# Patient Record
Sex: Male | Born: 1945 | Race: Black or African American | Hispanic: No | Marital: Married | Smoking: Former smoker
Health system: Southern US, Community
[De-identification: ages and names within clinical notes are randomized; demographics above are authoritative.]

## PROBLEM LIST (undated history)

## (undated) ENCOUNTER — Emergency Department (HOSPITAL_COMMUNITY): Admission: EM | Payer: Medicare PPO | Source: Home / Self Care

## (undated) DIAGNOSIS — J449 Chronic obstructive pulmonary disease, unspecified: Secondary | ICD-10-CM

## (undated) DIAGNOSIS — N183 Chronic kidney disease, stage 3 (moderate): Secondary | ICD-10-CM

## (undated) DIAGNOSIS — I2510 Atherosclerotic heart disease of native coronary artery without angina pectoris: Secondary | ICD-10-CM

## (undated) DIAGNOSIS — I5042 Chronic combined systolic (congestive) and diastolic (congestive) heart failure: Secondary | ICD-10-CM

## (undated) DIAGNOSIS — I712 Thoracic aortic aneurysm, without rupture: Secondary | ICD-10-CM

## (undated) DIAGNOSIS — I429 Cardiomyopathy, unspecified: Secondary | ICD-10-CM

## (undated) DIAGNOSIS — I4819 Other persistent atrial fibrillation: Secondary | ICD-10-CM

## (undated) DIAGNOSIS — I10 Essential (primary) hypertension: Secondary | ICD-10-CM

## (undated) DIAGNOSIS — Z7901 Long term (current) use of anticoagulants: Secondary | ICD-10-CM

## (undated) DIAGNOSIS — I509 Heart failure, unspecified: Secondary | ICD-10-CM

## (undated) DIAGNOSIS — E785 Hyperlipidemia, unspecified: Secondary | ICD-10-CM

## (undated) DIAGNOSIS — J189 Pneumonia, unspecified organism: Secondary | ICD-10-CM

## (undated) DIAGNOSIS — C9590 Leukemia, unspecified not having achieved remission: Secondary | ICD-10-CM

## (undated) DIAGNOSIS — K759 Inflammatory liver disease, unspecified: Secondary | ICD-10-CM

## (undated) DIAGNOSIS — D649 Anemia, unspecified: Secondary | ICD-10-CM

## (undated) DIAGNOSIS — K219 Gastro-esophageal reflux disease without esophagitis: Secondary | ICD-10-CM

## (undated) DIAGNOSIS — R51 Headache: Secondary | ICD-10-CM

## (undated) DIAGNOSIS — G8929 Other chronic pain: Secondary | ICD-10-CM

## (undated) DIAGNOSIS — IMO0002 Reserved for concepts with insufficient information to code with codable children: Secondary | ICD-10-CM

## (undated) DIAGNOSIS — N400 Enlarged prostate without lower urinary tract symptoms: Secondary | ICD-10-CM

## (undated) DIAGNOSIS — M1990 Unspecified osteoarthritis, unspecified site: Secondary | ICD-10-CM

## (undated) DIAGNOSIS — M329 Systemic lupus erythematosus, unspecified: Secondary | ICD-10-CM

## (undated) DIAGNOSIS — R350 Frequency of micturition: Secondary | ICD-10-CM

## (undated) DIAGNOSIS — C9000 Multiple myeloma not having achieved remission: Secondary | ICD-10-CM

## (undated) DIAGNOSIS — E079 Disorder of thyroid, unspecified: Secondary | ICD-10-CM

## (undated) DIAGNOSIS — G4730 Sleep apnea, unspecified: Secondary | ICD-10-CM

## (undated) DIAGNOSIS — R0600 Dyspnea, unspecified: Secondary | ICD-10-CM

## (undated) HISTORY — PX: EYE SURGERY: SHX253

## (undated) HISTORY — PX: COLONOSCOPY: SHX174

## (undated) HISTORY — PX: CHOLECYSTECTOMY: SHX55

## (undated) HISTORY — PX: BACK SURGERY: SHX140

## (undated) HISTORY — PX: TONSILLECTOMY: SUR1361

## (undated) HISTORY — PX: ABDOMINAL SURGERY: SHX537

## (undated) HISTORY — PX: HERNIA REPAIR: SHX51

## (undated) HISTORY — PX: OTHER SURGICAL HISTORY: SHX169

## (undated) HISTORY — PX: ROTATOR CUFF REPAIR: SHX139

## (undated) HISTORY — PX: HAND SURGERY: SHX662

## (undated) HISTORY — PX: CORONARY ANGIOPLASTY: SHX604

## (undated) HISTORY — DX: Atherosclerotic heart disease of native coronary artery without angina pectoris: I25.10

## (undated) HISTORY — DX: Long term (current) use of anticoagulants: Z79.01

## (undated) HISTORY — DX: Cardiomyopathy, unspecified: I42.9

## (undated) HISTORY — DX: Essential (primary) hypertension: I10

## (undated) HISTORY — DX: Thoracic aortic aneurysm, without rupture: I71.2

## (undated) HISTORY — DX: Chronic obstructive pulmonary disease, unspecified: J44.9

## (undated) HISTORY — DX: Chronic kidney disease, stage 3 (moderate): N18.3

## (undated) HISTORY — DX: Chronic combined systolic (congestive) and diastolic (congestive) heart failure: I50.42

## (undated) HISTORY — DX: Other persistent atrial fibrillation: I48.19

---

## 1999-06-23 ENCOUNTER — Emergency Department (HOSPITAL_COMMUNITY): Admission: EM | Admit: 1999-06-23 | Discharge: 1999-06-23 | Payer: Self-pay | Admitting: Emergency Medicine

## 1999-06-23 ENCOUNTER — Encounter: Payer: Self-pay | Admitting: Emergency Medicine

## 2000-05-23 ENCOUNTER — Ambulatory Visit (HOSPITAL_COMMUNITY): Admission: RE | Admit: 2000-05-23 | Discharge: 2000-05-23 | Payer: Self-pay | Admitting: Internal Medicine

## 2000-05-23 ENCOUNTER — Encounter: Payer: Self-pay | Admitting: Internal Medicine

## 2005-09-25 ENCOUNTER — Emergency Department (HOSPITAL_COMMUNITY): Admission: EM | Admit: 2005-09-25 | Discharge: 2005-09-25 | Payer: Self-pay | Admitting: Emergency Medicine

## 2006-12-16 ENCOUNTER — Emergency Department (HOSPITAL_COMMUNITY): Admission: EM | Admit: 2006-12-16 | Discharge: 2006-12-17 | Payer: Self-pay | Admitting: Emergency Medicine

## 2007-01-08 ENCOUNTER — Emergency Department (HOSPITAL_COMMUNITY): Admission: EM | Admit: 2007-01-08 | Discharge: 2007-01-08 | Payer: Self-pay | Admitting: Emergency Medicine

## 2009-02-05 ENCOUNTER — Encounter: Admission: RE | Admit: 2009-02-05 | Discharge: 2009-02-05 | Payer: Self-pay | Admitting: Gastroenterology

## 2009-08-03 ENCOUNTER — Emergency Department (HOSPITAL_COMMUNITY): Admission: EM | Admit: 2009-08-03 | Discharge: 2009-08-03 | Payer: Self-pay | Admitting: Emergency Medicine

## 2009-12-15 ENCOUNTER — Ambulatory Visit (HOSPITAL_COMMUNITY): Admission: RE | Admit: 2009-12-15 | Discharge: 2009-12-16 | Payer: Self-pay | Admitting: Specialist

## 2010-08-22 ENCOUNTER — Encounter: Payer: Self-pay | Admitting: Gastroenterology

## 2010-08-26 ENCOUNTER — Encounter
Admission: RE | Admit: 2010-08-26 | Discharge: 2010-08-26 | Payer: Self-pay | Source: Home / Self Care | Attending: Specialist | Admitting: Specialist

## 2010-09-23 ENCOUNTER — Other Ambulatory Visit: Payer: Self-pay | Admitting: Specialist

## 2010-09-23 ENCOUNTER — Encounter (HOSPITAL_COMMUNITY)
Admission: RE | Admit: 2010-09-23 | Discharge: 2010-09-23 | Disposition: A | Discharge status: Home / Self Care | Payer: Medicare Other | Source: Ambulatory Visit | Attending: Specialist | Admitting: Specialist

## 2010-09-23 ENCOUNTER — Encounter (HOSPITAL_COMMUNITY): Payer: Medicare Other

## 2010-09-23 DIAGNOSIS — Z01818 Encounter for other preprocedural examination: Secondary | ICD-10-CM | POA: Insufficient documentation

## 2010-09-23 DIAGNOSIS — Z01812 Encounter for preprocedural laboratory examination: Secondary | ICD-10-CM | POA: Insufficient documentation

## 2010-09-23 DIAGNOSIS — M48061 Spinal stenosis, lumbar region without neurogenic claudication: Secondary | ICD-10-CM | POA: Insufficient documentation

## 2010-09-23 DIAGNOSIS — Z01810 Encounter for preprocedural cardiovascular examination: Secondary | ICD-10-CM | POA: Insufficient documentation

## 2010-09-23 LAB — URINALYSIS, ROUTINE W REFLEX MICROSCOPIC
Bilirubin Urine: NEGATIVE
Hgb urine dipstick: NEGATIVE
Leukocytes, UA: NEGATIVE
Nitrite: NEGATIVE
Protein, ur: NEGATIVE mg/dL
Specific Gravity, Urine: 1.03 (ref 1.005–1.030)
Urine Glucose, Fasting: >1000 mg/dL — AB
Urobilinogen, UA: 0.2 mg/dL (ref 0.0–1.0)
pH: 5 (ref 5.0–8.0)

## 2010-09-23 LAB — SURGICAL PCR SCREEN
MRSA, PCR: NEGATIVE
Staphylococcus aureus: NEGATIVE

## 2010-09-23 LAB — COMPREHENSIVE METABOLIC PANEL
ALT: 127 U/L — ABNORMAL HIGH (ref 0–53)
AST: 57 U/L — ABNORMAL HIGH (ref 0–37)
Albumin: 4 g/dL (ref 3.5–5.2)
Alkaline Phosphatase: 68 U/L (ref 39–117)
BUN: 8 mg/dL (ref 6–23)
CO2: 27 mEq/L (ref 19–32)
Calcium: 10.1 mg/dL (ref 8.4–10.5)
Chloride: 104 mEq/L (ref 96–112)
Creatinine, Ser: 0.98 mg/dL (ref 0.4–1.5)
GFR calc Af Amer: >60 mL/min (ref >60)
GFR calc non Af Amer: >60 mL/min (ref >60)
Glucose, Bld: 203 mg/dL — ABNORMAL HIGH (ref 70–99)
Potassium: 4.5 mEq/L (ref 3.5–5.1)
Sodium: 137 mEq/L (ref 135–145)
Total Bilirubin: 0.6 mg/dL (ref 0.3–1.2)
Total Protein: 8.8 g/dL — ABNORMAL HIGH (ref 6.0–8.3)

## 2010-09-23 LAB — CBC
HCT: 38.3 % — ABNORMAL LOW (ref 39.0–52.0)
Hemoglobin: 13 g/dL (ref 13.0–17.0)
MCH: 30.4 pg (ref 26.0–34.0)
MCHC: 33.9 g/dL (ref 30.0–36.0)
MCV: 89.7 fL (ref 78.0–100.0)
Platelets: 191 10*3/uL (ref 150–400)
RBC: 4.27 MIL/uL (ref 4.22–5.81)
RDW: 12.9 % (ref 11.5–15.5)
WBC: 5.7 10*3/uL (ref 4.0–10.5)

## 2010-09-23 LAB — APTT: aPTT: 28 seconds (ref 24–37)

## 2010-09-23 LAB — URINE MICROSCOPIC-ADD ON

## 2010-09-23 LAB — PROTIME-INR
INR: 1.04 (ref 0.00–1.49)
Prothrombin Time: 13.8 seconds (ref 11.6–15.2)

## 2010-09-28 ENCOUNTER — Ambulatory Visit (HOSPITAL_COMMUNITY): Payer: Medicare Other

## 2010-09-28 ENCOUNTER — Observation Stay (HOSPITAL_COMMUNITY)
Admission: RE | Admit: 2010-09-28 | Discharge: 2010-09-29 | Disposition: A | Discharge status: Home / Self Care | Payer: Medicare Other | Source: Ambulatory Visit | Attending: Specialist | Admitting: Specialist

## 2010-09-28 DIAGNOSIS — I10 Essential (primary) hypertension: Secondary | ICD-10-CM | POA: Insufficient documentation

## 2010-09-28 DIAGNOSIS — E119 Type 2 diabetes mellitus without complications: Secondary | ICD-10-CM | POA: Insufficient documentation

## 2010-09-28 DIAGNOSIS — M5126 Other intervertebral disc displacement, lumbar region: Principal | ICD-10-CM | POA: Insufficient documentation

## 2010-09-28 DIAGNOSIS — Z79899 Other long term (current) drug therapy: Secondary | ICD-10-CM | POA: Insufficient documentation

## 2010-09-28 LAB — GLUCOSE, CAPILLARY
Glucose-Capillary: 153 mg/dL — ABNORMAL HIGH (ref 70–99)
Glucose-Capillary: 129 mg/dL — ABNORMAL HIGH (ref 70–99)
Glucose-Capillary: 141 mg/dL — ABNORMAL HIGH (ref 70–99)
Glucose-Capillary: 153 mg/dL — ABNORMAL HIGH (ref 70–99)
Glucose-Capillary: 161 mg/dL — ABNORMAL HIGH (ref 70–99)

## 2010-09-29 LAB — GLUCOSE, CAPILLARY
Glucose-Capillary: 168 mg/dL — ABNORMAL HIGH (ref 70–99)
Glucose-Capillary: 189 mg/dL — ABNORMAL HIGH (ref 70–99)
Glucose-Capillary: 178 mg/dL — ABNORMAL HIGH (ref 70–99)
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
## 2010-10-17 LAB — GLUCOSE, CAPILLARY
Glucose-Capillary: 177 mg/dL — ABNORMAL HIGH (ref 70–99)
Glucose-Capillary: 200 mg/dL — ABNORMAL HIGH (ref 70–99)
Glucose-Capillary: 229 mg/dL — ABNORMAL HIGH (ref 70–99)
Glucose-Capillary: 238 mg/dL — ABNORMAL HIGH (ref 70–99)
Glucose-Capillary: 403 mg/dL — ABNORMAL HIGH (ref 70–99)

## 2010-10-18 LAB — URINALYSIS, ROUTINE W REFLEX MICROSCOPIC
Bilirubin Urine: NEGATIVE
Glucose, UA: 250 mg/dL — AB
Hgb urine dipstick: NEGATIVE
Ketones, ur: NEGATIVE mg/dL
Nitrite: NEGATIVE
Protein, ur: NEGATIVE mg/dL
Specific Gravity, Urine: 1.024 (ref 1.005–1.030)
Urobilinogen, UA: 0.2 mg/dL (ref 0.0–1.0)
pH: 5.5 (ref 5.0–8.0)

## 2010-10-18 LAB — CBC
HCT: 40.1 % (ref 39.0–52.0)
Hemoglobin: 12.9 g/dL — ABNORMAL LOW (ref 13.0–17.0)
MCHC: 32.3 g/dL (ref 30.0–36.0)
MCV: 82.2 fL (ref 78.0–100.0)
Platelets: 175 10*3/uL (ref 150–400)
RBC: 4.88 MIL/uL (ref 4.22–5.81)
RDW: 22.9 % — ABNORMAL HIGH (ref 11.5–15.5)
WBC: 5 10*3/uL (ref 4.0–10.5)

## 2010-10-18 LAB — COMPREHENSIVE METABOLIC PANEL
ALT: 55 U/L — ABNORMAL HIGH (ref 0–53)
AST: 40 U/L — ABNORMAL HIGH (ref 0–37)
Albumin: 4.2 g/dL (ref 3.5–5.2)
Alkaline Phosphatase: 73 U/L (ref 39–117)
BUN: 8 mg/dL (ref 6–23)
CO2: 23 mEq/L (ref 19–32)
Calcium: 9.8 mg/dL (ref 8.4–10.5)
Chloride: 109 mEq/L (ref 96–112)
Creatinine, Ser: 0.91 mg/dL (ref 0.4–1.5)
GFR calc Af Amer: >60 mL/min (ref >60)
GFR calc non Af Amer: >60 mL/min (ref >60)
Glucose, Bld: 182 mg/dL — ABNORMAL HIGH (ref 70–99)
Potassium: 4.1 mEq/L (ref 3.5–5.1)
Sodium: 138 mEq/L (ref 135–145)
Total Bilirubin: 0.4 mg/dL (ref 0.3–1.2)
Total Protein: 8.8 g/dL — ABNORMAL HIGH (ref 6.0–8.3)

## 2010-10-18 LAB — APTT: aPTT: 30 seconds (ref 24–37)

## 2010-10-18 LAB — PROTIME-INR
INR: 1.12 (ref 0.00–1.49)
Prothrombin Time: 14.3 seconds (ref 11.6–15.2)

## 2011-02-24 ENCOUNTER — Emergency Department (HOSPITAL_COMMUNITY)
Admission: EM | Admit: 2011-02-24 | Discharge: 2011-02-24 | Disposition: A | Discharge status: Home / Self Care | Payer: Medicare Other | Attending: Emergency Medicine | Admitting: Emergency Medicine

## 2011-02-24 ENCOUNTER — Emergency Department (HOSPITAL_COMMUNITY): Payer: Medicare Other

## 2011-02-24 DIAGNOSIS — R0602 Shortness of breath: Secondary | ICD-10-CM | POA: Insufficient documentation

## 2011-02-24 DIAGNOSIS — R197 Diarrhea, unspecified: Secondary | ICD-10-CM | POA: Insufficient documentation

## 2011-02-24 DIAGNOSIS — R109 Unspecified abdominal pain: Secondary | ICD-10-CM | POA: Insufficient documentation

## 2011-02-24 DIAGNOSIS — K7689 Other specified diseases of liver: Secondary | ICD-10-CM | POA: Insufficient documentation

## 2011-02-24 DIAGNOSIS — R05 Cough: Secondary | ICD-10-CM | POA: Insufficient documentation

## 2011-02-24 DIAGNOSIS — R059 Cough, unspecified: Secondary | ICD-10-CM | POA: Insufficient documentation

## 2011-02-24 DIAGNOSIS — R110 Nausea: Secondary | ICD-10-CM | POA: Insufficient documentation

## 2011-02-24 LAB — DIFFERENTIAL
Basophils Absolute: 0 10*3/uL (ref 0.0–0.1)
Basophils Relative: 0 % (ref 0–1)
Eosinophils Absolute: 0.1 10*3/uL (ref 0.0–0.7)
Eosinophils Relative: 3 % (ref 0–5)
Lymphocytes Relative: 39 % (ref 12–46)
Lymphs Abs: 2 10*3/uL (ref 0.7–4.0)
Monocytes Absolute: 0.4 10*3/uL (ref 0.1–1.0)
Monocytes Relative: 8 % (ref 3–12)
Neutro Abs: 2.6 10*3/uL (ref 1.7–7.7)
Neutrophils Relative %: 50 % (ref 43–77)

## 2011-02-24 LAB — COMPREHENSIVE METABOLIC PANEL
ALT: 72 U/L — ABNORMAL HIGH (ref 0–53)
AST: 48 U/L — ABNORMAL HIGH (ref 0–37)
Albumin: 4.1 g/dL (ref 3.5–5.2)
Alkaline Phosphatase: 70 U/L (ref 39–117)
BUN: 11 mg/dL (ref 6–23)
CO2: 21 mEq/L (ref 19–32)
Calcium: 10.1 mg/dL (ref 8.4–10.5)
Chloride: 102 mEq/L (ref 96–112)
Creatinine, Ser: 0.91 mg/dL (ref 0.50–1.35)
GFR calc Af Amer: >60 mL/min (ref >60)
GFR calc non Af Amer: >60 mL/min (ref >60)
Glucose, Bld: 120 mg/dL — ABNORMAL HIGH (ref 70–99)
Potassium: 3.9 mEq/L (ref 3.5–5.1)
Sodium: 135 mEq/L (ref 135–145)
Total Bilirubin: 0.3 mg/dL (ref 0.3–1.2)
Total Protein: 9.8 g/dL — ABNORMAL HIGH (ref 6.0–8.3)

## 2011-02-24 LAB — CBC
HCT: 37.7 % — ABNORMAL LOW (ref 39.0–52.0)
Hemoglobin: 13 g/dL (ref 13.0–17.0)
MCH: 29.8 pg (ref 26.0–34.0)
MCHC: 34.5 g/dL (ref 30.0–36.0)
MCV: 86.5 fL (ref 78.0–100.0)
Platelets: 190 10*3/uL (ref 150–400)
RBC: 4.36 MIL/uL (ref 4.22–5.81)
RDW: 13 % (ref 11.5–15.5)
WBC: 5.2 10*3/uL (ref 4.0–10.5)

## 2011-02-24 LAB — OCCULT BLOOD, POC DEVICE: Fecal Occult Bld: NEGATIVE

## 2011-02-24 LAB — LIPASE, BLOOD: Lipase: 52 U/L (ref 11–59)

## 2011-02-24 MED ORDER — IOHEXOL 300 MG/ML  SOLN
100.0000 mL | Freq: Once | INTRAMUSCULAR | Status: AC | PRN
  Administered 2011-02-24: 100 mL via INTRAVENOUS

## 2011-03-14 ENCOUNTER — Other Ambulatory Visit: Payer: Self-pay | Admitting: Gastroenterology

## 2011-03-15 ENCOUNTER — Ambulatory Visit
Admission: RE | Admit: 2011-03-15 | Discharge: 2011-03-15 | Disposition: A | Discharge status: Home / Self Care | Payer: Medicare Other | Source: Ambulatory Visit | Attending: Gastroenterology | Admitting: Gastroenterology

## 2011-03-15 ENCOUNTER — Other Ambulatory Visit: Payer: Self-pay | Admitting: Gastroenterology

## 2011-06-28 ENCOUNTER — Other Ambulatory Visit: Payer: Self-pay | Admitting: Otolaryngology

## 2011-06-28 DIAGNOSIS — J329 Chronic sinusitis, unspecified: Secondary | ICD-10-CM

## 2011-06-29 ENCOUNTER — Other Ambulatory Visit: Payer: Medicare Other

## 2011-06-30 ENCOUNTER — Emergency Department (HOSPITAL_COMMUNITY): Payer: Medicare Other

## 2011-06-30 ENCOUNTER — Encounter: Payer: Self-pay | Admitting: Emergency Medicine

## 2011-06-30 ENCOUNTER — Emergency Department (HOSPITAL_COMMUNITY)
Admission: EM | Admit: 2011-06-30 | Discharge: 2011-06-30 | Disposition: A | Discharge status: Home / Self Care | Payer: Medicare Other | Attending: Emergency Medicine | Admitting: Emergency Medicine

## 2011-06-30 DIAGNOSIS — R42 Dizziness and giddiness: Secondary | ICD-10-CM | POA: Insufficient documentation

## 2011-06-30 DIAGNOSIS — I10 Essential (primary) hypertension: Secondary | ICD-10-CM | POA: Insufficient documentation

## 2011-06-30 DIAGNOSIS — R109 Unspecified abdominal pain: Secondary | ICD-10-CM | POA: Insufficient documentation

## 2011-06-30 DIAGNOSIS — R10817 Generalized abdominal tenderness: Secondary | ICD-10-CM | POA: Insufficient documentation

## 2011-06-30 DIAGNOSIS — R197 Diarrhea, unspecified: Secondary | ICD-10-CM

## 2011-06-30 DIAGNOSIS — R5381 Other malaise: Secondary | ICD-10-CM | POA: Insufficient documentation

## 2011-06-30 DIAGNOSIS — R195 Other fecal abnormalities: Secondary | ICD-10-CM | POA: Insufficient documentation

## 2011-06-30 DIAGNOSIS — E119 Type 2 diabetes mellitus without complications: Secondary | ICD-10-CM | POA: Insufficient documentation

## 2011-06-30 DIAGNOSIS — K6289 Other specified diseases of anus and rectum: Secondary | ICD-10-CM | POA: Insufficient documentation

## 2011-06-30 DIAGNOSIS — R5383 Other fatigue: Secondary | ICD-10-CM | POA: Insufficient documentation

## 2011-06-30 HISTORY — DX: Essential (primary) hypertension: I10

## 2011-06-30 HISTORY — DX: Unspecified osteoarthritis, unspecified site: M19.90

## 2011-06-30 HISTORY — DX: Disorder of thyroid, unspecified: E07.9

## 2011-06-30 LAB — POCT I-STAT, CHEM 8
BUN: 28 mg/dL — ABNORMAL HIGH (ref 6–23)
Calcium, Ion: 1.21 mmol/L (ref 1.12–1.32)
Chloride: 106 mEq/L (ref 96–112)
Creatinine, Ser: 0.7 mg/dL (ref 0.50–1.35)
Glucose, Bld: 142 mg/dL — ABNORMAL HIGH (ref 70–99)
HCT: 31 % — ABNORMAL LOW (ref 39.0–52.0)
Hemoglobin: 10.5 g/dL — ABNORMAL LOW (ref 13.0–17.0)
Potassium: 4.5 mEq/L (ref 3.5–5.1)
Sodium: 141 mEq/L (ref 135–145)
TCO2: 24 mmol/L (ref 0–100)

## 2011-06-30 LAB — COMPREHENSIVE METABOLIC PANEL
ALT: 51 U/L (ref 0–53)
AST: 37 U/L (ref 0–37)
Albumin: 3.7 g/dL (ref 3.5–5.2)
Alkaline Phosphatase: 67 U/L (ref 39–117)
BUN: 22 mg/dL (ref 6–23)
CO2: 25 mEq/L (ref 19–32)
Calcium: 9.9 mg/dL (ref 8.4–10.5)
Chloride: 103 mEq/L (ref 96–112)
Creatinine, Ser: 0.95 mg/dL (ref 0.50–1.35)
GFR calc Af Amer: >90 mL/min (ref >90)
GFR calc non Af Amer: 84 mL/min — ABNORMAL LOW (ref >90)
Glucose, Bld: 108 mg/dL — ABNORMAL HIGH (ref 70–99)
Potassium: 4.5 mEq/L (ref 3.5–5.1)
Sodium: 135 mEq/L (ref 135–145)
Total Bilirubin: 0.3 mg/dL (ref 0.3–1.2)
Total Protein: 8.3 g/dL (ref 6.0–8.3)

## 2011-06-30 LAB — PROTIME-INR
INR: 1.11 (ref 0.00–1.49)
Prothrombin Time: 14.5 seconds (ref 11.6–15.2)

## 2011-06-30 LAB — CBC
HCT: 31.9 % — ABNORMAL LOW (ref 39.0–52.0)
Hemoglobin: 10.7 g/dL — ABNORMAL LOW (ref 13.0–17.0)
MCH: 30 pg (ref 26.0–34.0)
MCHC: 33.5 g/dL (ref 30.0–36.0)
MCV: 89.4 fL (ref 78.0–100.0)
Platelets: 204 10*3/uL (ref 150–400)
RBC: 3.57 MIL/uL — ABNORMAL LOW (ref 4.22–5.81)
RDW: 12.9 % (ref 11.5–15.5)
WBC: 5.8 10*3/uL (ref 4.0–10.5)

## 2011-06-30 LAB — SAMPLE TO BLOOD BANK

## 2011-06-30 LAB — DIFFERENTIAL
Basophils Absolute: 0 10*3/uL (ref 0.0–0.1)
Basophils Relative: 0 % (ref 0–1)
Eosinophils Absolute: 0.1 10*3/uL (ref 0.0–0.7)
Eosinophils Relative: 2 % (ref 0–5)
Lymphocytes Relative: 26 % (ref 12–46)
Lymphs Abs: 1.5 10*3/uL (ref 0.7–4.0)
Monocytes Absolute: 0.5 10*3/uL (ref 0.1–1.0)
Monocytes Relative: 8 % (ref 3–12)
Neutro Abs: 3.7 10*3/uL (ref 1.7–7.7)
Neutrophils Relative %: 64 % (ref 43–77)

## 2011-06-30 LAB — OCCULT BLOOD, POC DEVICE: Fecal Occult Bld: POSITIVE

## 2011-06-30 MED ORDER — MORPHINE SULFATE 4 MG/ML IJ SOLN: 6.0000 mg | INTRAMUSCULAR | Status: DC | PRN

## 2011-06-30 MED ORDER — MORPHINE SULFATE 2 MG/ML IJ SOLN
INTRAMUSCULAR | Status: AC
  Administered 2011-06-30: 6 mg via INTRAVENOUS
  Filled 2011-06-30: qty 3

## 2011-06-30 MED ORDER — MORPHINE SULFATE 10 MG/ML IJ SOLN
INTRAMUSCULAR | Status: AC
  Administered 2011-06-30: 6 mg via INTRAVENOUS
  Filled 2011-06-30: qty 1

## 2011-06-30 MED ORDER — DIPHENHYDRAMINE HCL 25 MG PO CAPS
25.0000 mg | ORAL_CAPSULE | Freq: Once | ORAL | Status: AC
  Administered 2011-06-30: 25 mg via ORAL
  Filled 2011-06-30: qty 1

## 2011-06-30 MED ORDER — ONDANSETRON HCL 4 MG/2ML IJ SOLN
4.0000 mg | Freq: Once | INTRAMUSCULAR | Status: AC
  Administered 2011-06-30: 4 mg via INTRAVENOUS
  Filled 2011-06-30: qty 2

## 2011-06-30 MED ORDER — SODIUM CHLORIDE 0.9 % IV SOLN
Freq: Once | INTRAVENOUS | Status: AC
  Administered 2011-06-30: 20 mL via INTRAVENOUS

## 2011-06-30 MED ORDER — HYDROCODONE-ACETAMINOPHEN 5-325 MG PO TABS: 2.0000 | ORAL_TABLET | ORAL | Status: AC | PRN

## 2011-06-30 NOTE — ED Notes (Signed)
Up to br and sts that the BM was very dark and bloody.  Up set that he hasn't been seen. Attempted to explain that the ED is full and that it appears that he's next to be seen.  Sts that maybe he needs to go to [facility] , told that he always has that option

## 2011-06-30 NOTE — ED Notes (Signed)
Called [facility] to make sure they did receive the Chem 8 I-Stat order

## 2011-06-30 NOTE — ED Notes (Signed)
ZOX:WR60<AV> Expected date:06/29/11<BR> Expected time:11:57 PM<BR> Means of arrival:Ambulance<BR> Comments:<BR> abd pain

## 2011-06-30 NOTE — ED Notes (Addendum)
Per EMS patient sts that tues had a indwelling camera placed at [facility] and now had abd pain on the right side, sts that abd is bloated.  Sts that he was here 2 mos ago with the same thing.  Is not having black tarry stools tonight but has in recent past.  Sts that he is having sharp pain and feels like he is going to pass out

## 2011-06-30 NOTE — ED Provider Notes (Signed)
Medical screening examination/treatment/procedure(s) were performed by non-physician practitioner and as supervising physician I was immediately available for consultation/collaboration.  Aadam Zhen M Annajulia Lewing, MD 06/30/11 1755 

## 2011-06-30 NOTE — ED Notes (Signed)
Per EMS patient has indwelling camera for GI bleeding That was placed at [facility] on Tuesday.  Sts that pain started 2 hrs ago and has gotten worse now 10/10.  sts that this has happened once prior and they thought that he had a blockage of some sort.  Patient alert NAD except for pain.

## 2011-06-30 NOTE — ED Notes (Signed)
Had very small amt. Of bowel movement--NO bright red blood---stool clay colored.

## 2011-06-30 NOTE — ED Notes (Signed)
[  patient sts that pain is 9/10 prior to getting an additional 6mg  of morphine

## 2011-06-30 NOTE — ED Notes (Signed)
Up to br sts abd cramping severely.  No relief from pain medication.  Instructed to leave bm in comode

## 2011-06-30 NOTE — ED Notes (Signed)
Morphine 6mg  given iv and patient sts that pain is worse not better/  Patient sts that pain is cramping.  Moaning and groaning

## 2011-06-30 NOTE — ED Provider Notes (Signed)
Medical screening examination/treatment/procedure(s) were performed by non-physician practitioner and as supervising physician I was immediately available for consultation/collaboration.  Blong Busk M Agata Lucente, MD 06/30/11 0738 

## 2011-06-30 NOTE — ED Provider Notes (Signed)
History     CSN: [id number] Arrival date & time: 06/30/2011 12:35 AM   First MD Initiated Contact with Patient 06/30/11 0406      Chief Complaint  Patient presents with  . Abdominal Pain    (Consider location/radiation/quality/duration/timing/severity/associated sxs/prior treatment) HPI Comments: This patient has had frequent episodes of lower GI bleeds, without an identified source.  He is currently being evaluated by GI physicians at [facility].  He recently had a camera study performed where he swallowed a small study camera.  He dropped off the computer generated printout with his GI doctor 2 days ago and has not received any result.  Tonight, developed diffuse, crampy low abdominal pain, followed by an explosive bout of bloody diarrhea, at the same time developed, diaphoresis, nausea, and became slightly dizzy.  This has happened in the past and no definitive diagnosis has been made.  Food, fluids, position, offer no relief of his discomfort, he does report that eating bring about a sudden episode of crampy pain.  Patient is a 65 y.o. male presenting with abdominal pain. The history is provided by the patient.  Abdominal Pain The primary symptoms of the illness include abdominal pain, nausea and hematochezia. The primary symptoms of the illness do not include fever, fatigue, shortness of breath, vomiting, diarrhea, hematemesis or dysuria. The current episode started yesterday. The onset of the illness was gradual. The problem has been gradually worsening.  The abdominal pain began 13 to24 hours ago. The pain came on gradually. The abdominal pain has been gradually worsening since its onset. The abdominal pain is generalized. The abdominal pain does not radiate. The severity of the abdominal pain is 8/10. The abdominal pain is relieved by nothing.  Nausea began 3 to 5 days ago. The nausea is exacerbated by food.  The patient has had a change in bowel habit. Symptoms  associated with the illness do not include chills, heartburn, constipation, hematuria or back pain.    Past Medical History  Diagnosis Date  . Diabetes mellitus   . Hypertension   . Arthritis   . Thyroid disease     Past Surgical History  Procedure Date  . Cholecystectomy   . Abdominal surgery   . Hernia repair     No family history on file.  History  Substance Use Topics  . Smoking status: Never Smoker   . Smokeless tobacco: Not on file  . Alcohol Use: No      Review of Systems  Constitutional: Negative for fever, chills and fatigue.  HENT: Negative.   Eyes: Negative.   Respiratory: Negative for shortness of breath.   Gastrointestinal: Positive for nausea, abdominal pain, blood in stool, hematochezia and rectal pain. Negative for heartburn, vomiting, diarrhea, constipation, abdominal distention, anal bleeding and hematemesis.  Genitourinary: Negative for dysuria, hematuria and decreased urine volume.  Musculoskeletal: Negative for myalgias and back pain.  Skin: Negative for pallor.  Neurological: Positive for dizziness and weakness. Negative for headaches.    Allergies  Review of patient's allergies indicates no known allergies.  Home Medications   Current Outpatient Rx  Name Route Sig Dispense Refill  . AMLODIPINE BESYLATE 10 MG PO TABS Oral Take 10 mg by mouth daily.      Marland Kitchen GABAPENTIN 300 MG PO CAPS Oral Take 300 mg by mouth 3 (three) times daily.      Marland Kitchen GLIMEPIRIDE 2 MG PO TABS Oral Take 2 mg by mouth daily before breakfast.      . LOSARTAN  POTASSIUM 50 MG PO TABS Oral Take 50 mg by mouth daily.      Marland Kitchen METFORMIN HCL ER 500 MG PO TB24 Oral Take 500 mg by mouth daily with breakfast.      . OMEPRAZOLE 20 MG PO CPDR Oral Take 20 mg by mouth daily.      . SUCRALFATE 1 G PO TABS Oral Take 1 g by mouth 4 (four) times daily.      Marland Kitchen TERAZOSIN HCL 5 MG PO CAPS Oral Take 5 mg by mouth at bedtime.      . TRAMADOL HCL 50 MG PO TABS Oral Take 50 mg by mouth every 6 (six)  hours as needed. Maximum dose= 8 tablets per day       BP 159/65  Pulse 89  Temp(Src) 98 F (36.7 C) (Oral)  Resp 20  SpO2 100%  Physical Exam  Constitutional: He appears well-developed and well-nourished.  HENT:  Head: Normocephalic.  Eyes: EOM are normal.  Neck: Neck supple.  Cardiovascular: Normal rate.   Pulmonary/Chest: Breath sounds normal.  Abdominal: Soft. He exhibits no distension, no abdominal bruit and no mass. There is no hepatosplenomegaly. There is generalized tenderness. There is no rebound.  Genitourinary: Rectal exam shows no external hemorrhoid, no internal hemorrhoid, no fissure, no tenderness and anal tone normal. Guaiac positive stool. Prostate is not tender. Circumcised.    ED Course  Procedures (including critical care time)  Labs Reviewed  CBC - Abnormal; Notable for the following:    RBC 3.57 (*)    Hemoglobin 10.7 (*)    HCT 31.9 (*)    All other components within normal limits  COMPREHENSIVE METABOLIC PANEL - Abnormal; Notable for the following:    Glucose, Bld 108 (*)    GFR calc non Af Amer 84 (*)    All other components within normal limits  OCCULT BLOOD, POC DEVICE  DIFFERENTIAL  PROTIME-INR  SAMPLE TO BLOOD BANK  OCCULT BLOOD X 1 CARD TO LAB, STOOL   No results found.   No diagnosis found.    MDM  Lower Gi Bleed  Ulcerative colitis Internal hemorrhoids         Arman Filter, NP 06/30/11 0630

## 2011-06-30 NOTE — ED Notes (Signed)
Still c/o abdominal pain---states "I need to use the restroom."  Ambulatory to restroom--family member at bedside.

## 2011-06-30 NOTE — ED Notes (Signed)
Dark stool noted but no blood in bowl noted

## 2011-06-30 NOTE — ED Notes (Signed)
Labs drawn for I-Stat.  No change in pt's status.

## 2011-06-30 NOTE — ED Provider Notes (Signed)
Pt originally seen by Earley Favor and handed off to me. Pt seen by [facility] for hx of GI bleeding, which is currently being worked up. Pt came to ED for episode of explosive bloody bowel movement. Pt is currently stable. Will call [facility] and discuss pt with his care team. Pts hemaglobin has remained stable in ED. Spoke with [facility], pts Gi doctor will be given a note to make him aware that the patient was in the ED. Pt is to follow-up with his GI doctor at [facility]  Dorthula Matas, PA 06/30/11 [phone]

## 2011-07-01 DIAGNOSIS — J189 Pneumonia, unspecified organism: Secondary | ICD-10-CM

## 2011-07-01 HISTORY — DX: Pneumonia, unspecified organism: J18.9

## 2011-07-04 ENCOUNTER — Ambulatory Visit
Admission: RE | Admit: 2011-07-04 | Discharge: 2011-07-04 | Disposition: A | Discharge status: Home / Self Care | Payer: Medicare Other | Source: Ambulatory Visit | Attending: Otolaryngology | Admitting: Otolaryngology

## 2011-07-04 DIAGNOSIS — J329 Chronic sinusitis, unspecified: Secondary | ICD-10-CM

## 2011-07-11 ENCOUNTER — Telehealth: Payer: Self-pay | Admitting: Oncology

## 2011-07-11 NOTE — Telephone Encounter (Signed)
S/w pt re appt for 12/14 @ 1:30 pm w/FS.

## 2011-07-12 ENCOUNTER — Telehealth: Payer: Self-pay | Admitting: Oncology

## 2011-07-12 NOTE — Telephone Encounter (Signed)
Per tiffany/amy not a new pt. appt moved out of 2 pm new pt slot and moved to remaining portion of AM np slot @ 11:30 am. S/w pt today he is aware of change and has new time for 12/14 @ 11 am.

## 2011-07-13 ENCOUNTER — Telehealth: Payer: Self-pay | Admitting: Oncology

## 2011-07-13 NOTE — Telephone Encounter (Signed)
Referred by Dr. [name] Dr. Roe Coombs Protein

## 2011-07-14 ENCOUNTER — Ambulatory Visit (HOSPITAL_COMMUNITY)
Admission: RE | Admit: 2011-07-14 | Discharge: 2011-07-14 | Disposition: A | Discharge status: Home / Self Care | Payer: Medicare Other | Source: Ambulatory Visit | Attending: Oncology | Admitting: Oncology

## 2011-07-14 ENCOUNTER — Other Ambulatory Visit (HOSPITAL_BASED_OUTPATIENT_CLINIC_OR_DEPARTMENT_OTHER): Payer: Medicare Other | Admitting: Lab

## 2011-07-14 ENCOUNTER — Telehealth: Payer: Self-pay | Admitting: Oncology

## 2011-07-14 ENCOUNTER — Other Ambulatory Visit: Payer: Self-pay | Admitting: Oncology

## 2011-07-14 ENCOUNTER — Ambulatory Visit: Payer: Medicare Other | Admitting: Oncology

## 2011-07-14 ENCOUNTER — Ambulatory Visit (HOSPITAL_BASED_OUTPATIENT_CLINIC_OR_DEPARTMENT_OTHER): Payer: Medicare Other | Admitting: Oncology

## 2011-07-14 ENCOUNTER — Ambulatory Visit: Payer: Medicare Other

## 2011-07-14 ENCOUNTER — Other Ambulatory Visit: Payer: Medicare Other | Admitting: Lab

## 2011-07-14 VITALS — BP 126/96 | HR 70 | Temp 97.3°F | Wt 212.7 lb

## 2011-07-14 DIAGNOSIS — D729 Disorder of white blood cells, unspecified: Secondary | ICD-10-CM

## 2011-07-14 DIAGNOSIS — M545 Low back pain, unspecified: Secondary | ICD-10-CM | POA: Insufficient documentation

## 2011-07-14 DIAGNOSIS — M79609 Pain in unspecified limb: Secondary | ICD-10-CM | POA: Insufficient documentation

## 2011-07-14 DIAGNOSIS — D649 Anemia, unspecified: Secondary | ICD-10-CM

## 2011-07-14 DIAGNOSIS — D472 Monoclonal gammopathy: Secondary | ICD-10-CM

## 2011-07-14 DIAGNOSIS — K921 Melena: Secondary | ICD-10-CM

## 2011-07-14 DIAGNOSIS — R109 Unspecified abdominal pain: Secondary | ICD-10-CM | POA: Insufficient documentation

## 2011-07-14 DIAGNOSIS — D7289 Other specified disorders of white blood cells: Secondary | ICD-10-CM

## 2011-07-14 DIAGNOSIS — R51 Headache: Secondary | ICD-10-CM | POA: Insufficient documentation

## 2011-07-14 LAB — CBC WITH DIFFERENTIAL/PLATELET
BASO%: 0.7 % (ref 0.0–2.0)
Basophils Absolute: 0 10*3/uL (ref 0.0–0.1)
EOS%: 4.7 % (ref 0.0–7.0)
Eosinophils Absolute: 0.2 10*3/uL (ref 0.0–0.5)
HCT: 26.7 % — ABNORMAL LOW (ref 38.4–49.9)
HGB: 9.1 g/dL — ABNORMAL LOW (ref 13.0–17.1)
LYMPH%: 37.7 % (ref 14.0–49.0)
MCH: 30.6 pg (ref 27.2–33.4)
MCHC: 33.9 g/dL (ref 32.0–36.0)
MCV: 90.4 fL (ref 79.3–98.0)
MONO#: 0.4 10*3/uL (ref 0.1–0.9)
MONO%: 10.8 % (ref 0.0–14.0)
NEUT#: 1.6 10*3/uL (ref 1.5–6.5)
NEUT%: 46.1 % (ref 39.0–75.0)
Platelets: 255 10*3/uL (ref 140–400)
RBC: 2.95 10*6/uL — ABNORMAL LOW (ref 4.20–5.82)
RDW: 13.6 % (ref 11.0–14.6)
WBC: 3.5 10*3/uL — ABNORMAL LOW (ref 4.0–10.3)
lymph#: 1.3 10*3/uL (ref 0.9–3.3)

## 2011-07-14 LAB — IRON AND TIBC
%SAT: 21 % (ref 20–55)
Iron: 95 ug/dL (ref 42–165)
TIBC: 449 ug/dL — ABNORMAL HIGH (ref 215–435)
UIBC: 354 ug/dL (ref 125–400)

## 2011-07-14 LAB — FERRITIN: Ferritin: 14 ng/mL — ABNORMAL LOW (ref 22–322)

## 2011-07-14 NOTE — Progress Notes (Signed)
Note dictated

## 2011-07-14 NOTE — Telephone Encounter (Signed)
gve the pt his jan 2013 appt calendar along with the bone survey appt for today at [facility]
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
## 2011-07-18 LAB — COMPREHENSIVE METABOLIC PANEL
ALT: 40 U/L (ref 0–53)
AST: 35 U/L (ref 0–37)
Albumin: 4.2 g/dL (ref 3.5–5.2)
Alkaline Phosphatase: 60 U/L (ref 39–117)
BUN: 9 mg/dL (ref 6–23)
CO2: 23 mEq/L (ref 19–32)
Calcium: 9.5 mg/dL (ref 8.4–10.5)
Chloride: 106 mEq/L (ref 96–112)
Creatinine, Ser: 1.12 mg/dL (ref 0.50–1.35)
Glucose, Bld: 85 mg/dL (ref 70–99)
Potassium: 3.8 mEq/L (ref 3.5–5.3)
Sodium: 138 mEq/L (ref 135–145)
Total Bilirubin: 0.3 mg/dL (ref 0.3–1.2)
Total Protein: 8 g/dL (ref 6.0–8.3)

## 2011-07-18 LAB — SPEP & IFE WITH QIG
Albumin ELP: 50.5 % — ABNORMAL LOW (ref 55.8–66.1)
Alpha-1-Globulin: 3.5 % (ref 2.9–4.9)
Alpha-2-Globulin: 8.9 % (ref 7.1–11.8)
Beta 2: 9.2 % — ABNORMAL HIGH (ref 3.2–6.5)
Beta Globulin: 6.1 % (ref 4.7–7.2)
Gamma Globulin: 21.8 % — ABNORMAL HIGH (ref 11.1–18.8)
IgA: 730 mg/dL — ABNORMAL HIGH (ref 68–379)
IgG (Immunoglobin G), Serum: 1960 mg/dL — ABNORMAL HIGH (ref 650–1600)
IgM, Serum: 28 mg/dL — ABNORMAL LOW (ref 41–251)
M-Spike, %: 0.43 g/dL
Total Protein, Serum Electrophoresis: 8 g/dL (ref 6.0–8.3)

## 2011-07-18 LAB — KAPPA/LAMBDA LIGHT CHAINS
Kappa free light chain: 5.41 mg/dL — ABNORMAL HIGH (ref 0.33–1.94)
Kappa:Lambda Ratio: 2.29 — ABNORMAL HIGH (ref 0.26–1.65)
Lambda Free Lght Chn: 2.36 mg/dL (ref 0.57–2.63)

## 2011-07-20 LAB — UIFE/LIGHT CHAINS/TP QN, 24-HR UR
Albumin, U: DETECTED
Alpha 1, Urine: DETECTED — AB
Alpha 2, Urine: DETECTED — AB
Beta, Urine: DETECTED — AB
Free Kappa Lt Chains,Ur: 13.3 mg/dL — ABNORMAL HIGH (ref 0.14–2.42)
Free Kappa/Lambda Ratio: 19.85 ratio — ABNORMAL HIGH (ref 2.04–10.37)
Free Lambda Excretion/Day: 11.06 mg/d
Free Lambda Lt Chains,Ur: 0.67 mg/dL (ref 0.02–0.67)
Free Lt Chn Excr Rate: 219.45 mg/d
Gamma Globulin, Urine: DETECTED — AB
Time: 24 hours
Total Protein, Urine-Ur/day: 251 mg/d — ABNORMAL HIGH (ref 10–140)
Total Protein, Urine: 15.2 mg/dL
Volume, Urine: 1650 mL

## 2011-08-06 ENCOUNTER — Encounter (HOSPITAL_COMMUNITY): Payer: Self-pay

## 2011-08-06 ENCOUNTER — Emergency Department (HOSPITAL_COMMUNITY)
Admission: EM | Admit: 2011-08-06 | Discharge: 2011-08-06 | Disposition: A | Discharge status: Home / Self Care | Payer: Medicare Other | Attending: Emergency Medicine | Admitting: Emergency Medicine

## 2011-08-06 ENCOUNTER — Emergency Department (HOSPITAL_COMMUNITY): Payer: Medicare Other

## 2011-08-06 ENCOUNTER — Other Ambulatory Visit (HOSPITAL_COMMUNITY): Payer: Self-pay

## 2011-08-06 DIAGNOSIS — G8918 Other acute postprocedural pain: Secondary | ICD-10-CM

## 2011-08-06 DIAGNOSIS — Z79899 Other long term (current) drug therapy: Secondary | ICD-10-CM | POA: Insufficient documentation

## 2011-08-06 DIAGNOSIS — M129 Arthropathy, unspecified: Secondary | ICD-10-CM | POA: Insufficient documentation

## 2011-08-06 DIAGNOSIS — I10 Essential (primary) hypertension: Secondary | ICD-10-CM | POA: Insufficient documentation

## 2011-08-06 DIAGNOSIS — E119 Type 2 diabetes mellitus without complications: Secondary | ICD-10-CM | POA: Insufficient documentation

## 2011-08-06 DIAGNOSIS — E079 Disorder of thyroid, unspecified: Secondary | ICD-10-CM | POA: Insufficient documentation

## 2011-08-06 MED ORDER — HYDROCODONE-ACETAMINOPHEN 7.5-500 MG/15ML PO SOLN: 15.0000 mL | Freq: Four times a day (QID) | ORAL | Status: AC | PRN

## 2011-08-06 MED ORDER — HYDROCODONE-ACETAMINOPHEN 7.5-500 MG/15ML PO SOLN
15.0000 mL | Freq: Once | ORAL | Status: AC
  Administered 2011-08-06: 15 mL via ORAL

## 2011-08-06 NOTE — ED Provider Notes (Signed)
History     CSN: [id number]  Arrival date & time 08/06/11  1422   First MD Initiated Contact with Patient 08/06/11 1448      Chief Complaint  Patient presents with  . Sore Throat    (Consider location/radiation/quality/duration/timing/severity/associated sxs/prior treatment) Patient is a 66 y.o. male presenting with pharyngitis. The history is provided by the patient.  Sore Throat This is a new problem. The current episode started yesterday. The problem occurs constantly. The problem has been gradually worsening. Associated symptoms include a sore throat. Pertinent negatives include no chills or fever. Associated symptoms comments: The patient had an EGD at [facility] on 08-03-11 and reports that his throat is significantly painful, with difficulty swallowing. He reports painful to clear his throat and seeing scant amounts of blood. . The symptoms are aggravated by swallowing. Treatments tried: He is being treated for pneumonia and reports symptoms of cough and chest tightness present at diagnosis are improved. No fever.     Past Medical History  Diagnosis Date  . Diabetes mellitus   . Hypertension   . Arthritis   . Thyroid disease     Past Surgical History  Procedure Date  . Cholecystectomy   . Abdominal surgery   . Hernia repair     No family history on file.  History  Substance Use Topics  . Smoking status: Never Smoker   . Smokeless tobacco: Not on file  . Alcohol Use: No      Review of Systems  Constitutional: Negative for fever and chills.  HENT: Positive for sore throat.   Respiratory: Negative.   Cardiovascular: Negative.   Gastrointestinal: Negative.   Musculoskeletal: Negative.   Skin: Negative.   Neurological: Negative.     Allergies  Review of patient's allergies indicates no known allergies.  Home Medications   Current Outpatient Rx  Name Route Sig Dispense Refill  . AMLODIPINE BESYLATE 10 MG PO TABS Oral Take 10 mg by mouth daily.      Marland Kitchen  GABAPENTIN 300 MG PO CAPS Oral Take 300 mg by mouth 3 (three) times daily.      Marland Kitchen GLIMEPIRIDE 2 MG PO TABS Oral Take 2 mg by mouth daily before breakfast.      . LOSARTAN POTASSIUM 50 MG PO TABS Oral Take 50 mg by mouth daily.      Marland Kitchen METFORMIN HCL ER 500 MG PO TB24 Oral Take 500 mg by mouth daily with breakfast.      . OMEPRAZOLE 20 MG PO CPDR Oral Take 20 mg by mouth daily.      . SUCRALFATE 1 G PO TABS Oral Take 1 g by mouth 4 (four) times daily.      . SUCRALFATE 1 GM/10ML PO SUSP Oral Take 1 g by mouth 4 (four) times daily.      Marland Kitchen TERAZOSIN HCL 5 MG PO CAPS Oral Take 5 mg by mouth at bedtime.      . TRAMADOL HCL 50 MG PO TABS Oral Take 50 mg by mouth every 6 (six) hours as needed. Maximum dose= 8 tablets per day for pain      BP 153/75  Pulse 101  Temp(Src) 97.8 F (36.6 C) (Oral)  Resp 20  SpO2 100%  Physical Exam  Constitutional: He is oriented to person, place, and time. He appears well-developed and well-nourished.  HENT:  Mouth/Throat: No oropharyngeal exudate.       Oropharynx is mildly erythematous without bleeding. Uvula midline without ulcerations.   Neck: Normal  range of motion. Neck supple. No tracheal deviation present. No thyromegaly present.  Pulmonary/Chest: Effort normal.  Musculoskeletal: Normal range of motion.  Neurological: He is alert and oriented to person, place, and time.  Skin: Skin is warm and dry.  Psychiatric: He has a normal mood and affect.    ED Course  Procedures (including critical care time)  Labs Reviewed - No data to display No results found.   No diagnosis found.    MDM  Pain is improved with medication. He is drinking water and able to swallow without difficulty. X-ray negative. Will discharge home to continue usual medications and follow up with [facility] for further pain.         Rodena Medin, PA 08/06/11 1755

## 2011-08-06 NOTE — ED Notes (Signed)
Patient stable upon discharge.  

## 2011-08-06 NOTE — ED Notes (Signed)
Family at bedside. 

## 2011-08-06 NOTE — ED Notes (Signed)
EAV:[id number]> Expected date:08/06/11<BR> Expected time:<BR> Means of arrival:<BR> Comments:<BR> Triage 2

## 2011-08-06 NOTE — ED Notes (Signed)
Pt in with sore throat states upper endoscopy done Wednesday at [facility] and states pain since states pain with swallowing

## 2011-08-06 NOTE — ED Notes (Signed)
Patient is resting comfortably. 

## 2011-08-07 NOTE — ED Provider Notes (Signed)
Medical screening examination/treatment/procedure(s) were performed by non-physician practitioner and as supervising physician I was immediately available for consultation/collaboration.  Donnetta Hutching, MD 08/07/11 [phone]

## 2011-08-11 ENCOUNTER — Telehealth: Payer: Self-pay | Admitting: Oncology

## 2011-08-11 ENCOUNTER — Other Ambulatory Visit: Payer: Medicare Other | Admitting: Lab

## 2011-08-11 ENCOUNTER — Ambulatory Visit (HOSPITAL_BASED_OUTPATIENT_CLINIC_OR_DEPARTMENT_OTHER): Payer: Medicare Other | Admitting: Oncology

## 2011-08-11 VITALS — BP 136/71 | HR 81 | Temp 97.6°F | Wt 209.6 lb

## 2011-08-11 DIAGNOSIS — K922 Gastrointestinal hemorrhage, unspecified: Secondary | ICD-10-CM

## 2011-08-11 DIAGNOSIS — D649 Anemia, unspecified: Secondary | ICD-10-CM

## 2011-08-11 DIAGNOSIS — D729 Disorder of white blood cells, unspecified: Secondary | ICD-10-CM

## 2011-08-11 DIAGNOSIS — D472 Monoclonal gammopathy: Secondary | ICD-10-CM

## 2011-08-11 NOTE — Telephone Encounter (Signed)
appt made and printed for 02/09/12   aom

## 2011-08-11 NOTE — Progress Notes (Signed)
Hematology and Oncology Follow Up Visit  James [name] [id number] 1945/08/14 66 y.o. 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 He has elevated IgG and IgA   Interim History:  James [name] returns today for a follow up visit. He still undergoing Gi work up to determine the etiology of his GI bleeding. No recent hospitalizations or illness.  He did go to the ED complaining of a sore throat. No pathological fractures, no recent infections. He still has some blood in the stool.  Medications: I have reviewed the patient's current medications. Current outpatient prescriptions:amLODipine (NORVASC) 10 MG tablet, Take 10 mg by mouth daily.  , Disp: , Rfl: ;  gabapentin (NEURONTIN) 300 MG capsule, Take 300 mg by mouth 3 (three) times daily.  , Disp: , Rfl: ;  glimepiride (AMARYL) 2 MG tablet, Take 2 mg by mouth daily before breakfast.  , Disp: , Rfl:  HYDROcodone-acetaminophen (LORTAB) 7.5-500 MG/15ML solution, Take 15 mLs by mouth every 6 (six) hours as needed for pain., Disp: 80 mL, Rfl: 0;  losartan (COZAAR) 50 MG tablet, Take 50 mg by mouth daily.  , Disp: , Rfl: ;  metFORMIN (GLUCOPHAGE-XR) 500 MG 24 hr tablet, Take 500 mg by mouth daily with breakfast.  , Disp: , Rfl: ;  omeprazole (PRILOSEC) 20 MG capsule, Take 20 mg by mouth daily.  , Disp: , Rfl:  sucralfate (CARAFATE) 1 G tablet, Take 1 g by mouth 4 (four) times daily.  , Disp: , Rfl: ;  sucralfate (CARAFATE) 1 GM/10ML suspension, Take 1 g by mouth 4 (four) times daily.  , Disp: , Rfl: ;  terazosin (HYTRIN) 5 MG capsule, Take 5 mg by mouth at bedtime.  , Disp: , Rfl: ;  traMADol (ULTRAM) 50 MG tablet, Take 50 mg by mouth every 6 (six) hours as needed. Maximum dose= 8 tablets per day for pain, Disp: , Rfl:   Allergies: No Known Allergies  Past Medical History, Surgical history, Social history, and Family History were  reviewed and updated.  Review of Systems: Constitutional:  Negative for fever, chills, night sweats, anorexia, weight loss, pain. Cardiovascular: no chest pain or dyspnea on exertion Respiratory: no cough, shortness of breath, or wheezing Neurological: no TIA or stroke symptoms Dermatological: negative ENT: negative Skin: Negative. Gastrointestinal: no abdominal pain, change in bowel habits, or black or bloody stools Genito-Urinary: no dysuria, trouble voiding, or hematuria Hematological and Lymphatic: negative Breast: negative Musculoskeletal: negative Remaining ROS negative. Physical Exam: Blood pressure 136/71, pulse 81, temperature 97.6 F (36.4 C), temperature source Oral, weight 209 lb 9.6 oz (95.074 kg). ECOG:  General appearance: alert Head: Normocephalic, without obvious abnormality, atraumatic Neck: no adenopathy, no carotid bruit, no JVD, supple, symmetrical, trachea midline and thyroid not enlarged, symmetric, no tenderness/mass/nodules Lymph nodes: Cervical, supraclavicular, and axillary nodes normal. Heart:regular rate and rhythm, S1, S2 normal, no murmur, click, rub or gallop Lung:chest clear, no wheezing, rales, normal symmetric air entry, no tachypnea, retractions or cyanosis Abdomin: soft, non-tender, without masses or organomegaly EXT:no erythema, induration, or nodules   Lab Results: Lab Results  Component Value Date   WBC 3.5* 07/14/2011   HGB 9.1* 07/14/2011   HCT 26.7* 07/14/2011   MCV 90.4 07/14/2011   PLT 255 07/14/2011     Chemistry      Component Value Date/Time   NA 138 07/14/2011 1102   K 3.8 07/14/2011 1102  CL 106 07/14/2011 1102   CO2 23 07/14/2011 1102   BUN 9 07/14/2011 1102   CREATININE 1.12 07/14/2011 1102      Component Value Date/Time   CALCIUM 9.5 07/14/2011 1102   ALKPHOS 60 07/14/2011 1102   AST 35 07/14/2011 1102   ALT 40 07/14/2011 1102   BILITOT 0.3 07/14/2011 1102     Results for James [name], James [name] (MRN [id number]) as  of 08/11/2011 15:03  Ref. Range 07/14/2011 11:02  KAPPA FREE LGHT CHN Latest Range: 0.33-1.94 mg/dL 4.09 (H)  Kappa:Lambda Ratio Latest Range: 0.26-1.65  2.29 (H)  Lambda Free Lght Chn Latest Range: 0.57-2.63 mg/dL 8.11  COMMENT (PROTEIN ELECTROPHOR) No range found *  Total Protein Latest Range: 6.0-8.3 g/dL   BJYNW-2-NFAOZHYQ Latest Range: 2.9-4.9 % 3.5  Alpha-2-Globulin Latest Range: 7.1-11.8 % 8.9  Beta Globulin Latest Range: 4.7-7.2 % 6.1  Beta 2 Latest Range: 3.2-6.5 % 9.2 (H)  Gamma Globulin Latest Range: 11.1-18.8 % 21.8 (H)  M-SPIKE, % No range found 0.43  IgG (Immunoglobin G), Serum Latest Range: 867-250-6791 mg/dL 6578 (H)  IgA Latest Range: 68-379 mg/dL 469 (H)  IgM, Serum Latest Range: 41-251 mg/dL 28 (L)  Total Protein, serum electrophor Latest Range: 6.0-8.3 g/dL 8.0    Radiological Studies: METASTATIC BONE SURVEY  Comparison: CT abdomen and pelvis 02/24/2011 reviewed.  Findings: No lytic or sclerotic lesion is identified. There is no  fracture. The patient has some cervical degenerative disease most  notable C4-5, C5-6 and C6-7. Loss of disc space height is also  seen at L4-5 and L5-S1 with vacuum disc phenomenon.  IMPRESSION:  Negative for evidence of myeloma or other neoplastic process of  bone.    Impression and Plan:  66 year old with:  1. IgG and IgA Kappa monoclonal gammopathy. This likely represent a true plasma cell disorder. No end organ damage to suggest myeloma. This could represent MGUS however. No further work up is needed now. I will repeat these studies in 6 months.  2.   GI bleeding. I do not see any link between that and his monoclonal gammopathy. His GI work up is ongoing.  3. Anemia: related to #2. Certainly he can benefit from Fe supplementation.   Eli Hose, MD 1/11/20133:18 PM

## 2011-08-31 DIAGNOSIS — K317 Polyp of stomach and duodenum: Secondary | ICD-10-CM | POA: Insufficient documentation

## 2011-08-31 DIAGNOSIS — B1920 Unspecified viral hepatitis C without hepatic coma: Secondary | ICD-10-CM | POA: Insufficient documentation

## 2011-09-01 DIAGNOSIS — Q446 Cystic disease of liver: Secondary | ICD-10-CM | POA: Insufficient documentation

## 2011-09-01 DIAGNOSIS — R7689 Other specified abnormal immunological findings in serum: Secondary | ICD-10-CM | POA: Insufficient documentation

## 2011-10-10 ENCOUNTER — Encounter (HOSPITAL_COMMUNITY): Payer: Self-pay | Admitting: Pharmacy Technician

## 2011-10-10 NOTE — H&P (Signed)
NAME:  IVERSON, SEES NO.:  [id number]  MEDICAL RECORD NO.:  [id number]  LOCATION:  [facility]                        FACILITY:  [facility]  PHYSICIAN:  Hermelinda Medicus, M.D.   DATE OF BIRTH:  June 24, 1946  DATE OF ADMISSION:  10/10/2011 DATE OF DISCHARGE:                             HISTORY & PHYSICAL   HISTORY OF PRESENT ILLNESS:  This patient is a 66 year old male, who has had persistent sinus problems.  He has had been on several antibiotics for sinus difficulties and he has been seen by Dr. Evette Cristal, Dr. Blair Heys, and his major issues are diabetes type 1.  He has also had some history of bleeding from his colon and he also has been had difficulty with his hemoglobin, which has now been more stabilized.  His latest CBGs for his diabetes were 118, 120, and 130 taken in the morning.  He has had a complete camera evaluation by Dr. Joseph Art at [facility].  They did not find any focal point of his bleeding.  He also has a history of hypertension, anemia, heartburn, hyperglyceridemia, diabetes type 2, back pain, and BPH with hepatitis C.  he had anemia, has been found to have no liver issues, has cleared at [facility].  He is taking care of by Dr. Thedore Mins. Wood at [facility] [place], and he has hepatitis B antibody positive, hepatitis C unknown genotype.  He has some liver cysts, but has been stated to have no hepatic vascular flow issues.  He has been cleared by Dr. Ulla Gallo of [facility].  He has compensated very well for his liver disease and has no signs of portal hypertension, and he has no increased risk of perioperative complications secondary to liver disease.  His major issue here is his sinuses.  He has had surgery under my care in December 2002 and had bilateral ethmoid, sphenoid, maxillary sinus surgery with a septal reconstruction.  He has done reasonably well until more recently when he has been on multiple antibiotics and his medications.  His  diabetes status has not been as smooth as in the past.  MEDICATIONS: 1. Metformin 500 taken 2 tablets twice a day. 2. Glimepiride 2 mg once a day. 3. Terazosin 5 mg twice a day. 4. Iron 325 and 65. 5. Gabapentin 300, 3 times a day for pain. 6. Omeprazole 20, was up to 3 times a day, but recommended once a day. 7. Tramadol 30, up to 1 tablet 4 times a day. 8. Losartan potassium 50 mg 1 tablet per day. 9. Amlodipine besylate 10 mg once a day. 10.Carafate 1 mg 4 times a day. 11.Astepro 0.15% solution 2 sprays to each nostril once a day.  PAST SURGICAL HISTORY: 1. Cholecystectomy. 2. Right inguinal hernia. 3. Colonoscopy. 4. Endoscopy. 5. Back surgery, on several occasions Dec 15, 2009. 6. Left cataract on November 2011.  PAST MEDICAL HISTORY: 1. Diabetes type 2. 2. Hypertension. 3. Hyperglyceridemia. 4. Chronic back pain. 5. Multinodular goiter, FNA was benign, however. 6. Hepatitis B and C. 7. He is anemic. 8. Has no renal manifestations.  Has had history, however, some     proteinuria secondary to his diabetes.  PHYSICAL EXAMINATION:  VITAL SIGNS:  Reveals a blood pressure of 144/78, pulse 86, weight is 215. HEENT:  His ears are clear.  Tympanic membranes look excellent and are moving well.  However, his nose looks reasonably good.  I do not see any obvious polyps, but his sinuses have drained considerable amount of purulent material.  He has been on considerable antibiotics in the past. His nasopharynx and oropharynx are clear.  True cords, false cords, epiglottis, base of tongue, lateral pharyngeal walls are completely clear of ulceration, mass, or lesion, if he does so some purulent drainage in the past.  True cord mobility, gag reflex, tongue mobility, EOM, facial nerve are all symmetrical as is shoulder strength.  He is oriented x3. CHEST:  Chest evaluation is unremarkable.  Clear to auscultation. HEART:  Regular sinus rhythm.  No murmur.  No opening snaps or  gallops. EXTREMITIES:  Normal, atraumatic. SKIN:  Color within normal limits. LYMPH NODES:  No evidence of any neck nodes or cervical adenopathy or posterior triangle adenopathy.  DIAGNOSES: 1. 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 2. History of hepatitis C. 3. History of hepatitis B. 4. History of hypertension. 5. History of hyperlipidemia. 6. Chronic back pain. 7. History of multinodular goiter with benign biopsy. 8. History of left cataract surgery. 9. Colonoscopy and endoscopy. 10.History of unexplained loss of blood with no obvious finding with     hemoglobin of 10. 11.Diabetes mellitus type 2.  PLAN:  To do functional endoscopic sinus surgery under local MAC at [facility] on October 12, 2011, with 23-hour observation.          ______________________________ Hermelinda Medicus, M.D.     JC/MEDQ  D:  10/10/2011  T:  10/10/2011  Job:  [id number]  cc:   Graylin Shiver, M.D. Lupita Raider, M.D. Bryan Lemma. [name], M.D.

## 2011-10-11 ENCOUNTER — Encounter (HOSPITAL_COMMUNITY): Payer: Self-pay | Admitting: *Deleted

## 2011-10-11 NOTE — Progress Notes (Addendum)
Attempted to get labwork from [facility] - has not been seen there since 08/2010.  Requesting labs from [facility] on [address] if available. --- 1427 pt only had PSA level checked at [facility].

## 2011-10-12 ENCOUNTER — Other Ambulatory Visit: Payer: Self-pay

## 2011-10-12 ENCOUNTER — Ambulatory Visit (HOSPITAL_COMMUNITY): Payer: Medicare Other

## 2011-10-12 ENCOUNTER — Encounter (HOSPITAL_COMMUNITY): Payer: Self-pay | Admitting: *Deleted

## 2011-10-12 ENCOUNTER — Encounter (HOSPITAL_COMMUNITY)
Admission: RE | Disposition: A | Discharge status: Home / Self Care | Payer: Self-pay | Source: Ambulatory Visit | Attending: Otolaryngology

## 2011-10-12 ENCOUNTER — Encounter (HOSPITAL_COMMUNITY): Payer: Self-pay

## 2011-10-12 ENCOUNTER — Ambulatory Visit (HOSPITAL_COMMUNITY)
Admission: RE | Admit: 2011-10-12 | Discharge: 2011-10-13 | Disposition: A | Discharge status: Home / Self Care | Payer: Medicare Other | Source: Ambulatory Visit | Attending: Otolaryngology | Admitting: Otolaryngology

## 2011-10-12 DIAGNOSIS — J338 Other polyp of sinus: Secondary | ICD-10-CM | POA: Insufficient documentation

## 2011-10-12 DIAGNOSIS — Z21 Asymptomatic human immunodeficiency virus [HIV] infection status: Secondary | ICD-10-CM | POA: Insufficient documentation

## 2011-10-12 DIAGNOSIS — R51 Headache: Secondary | ICD-10-CM | POA: Insufficient documentation

## 2011-10-12 DIAGNOSIS — J321 Chronic frontal sinusitis: Secondary | ICD-10-CM | POA: Insufficient documentation

## 2011-10-12 DIAGNOSIS — J320 Chronic maxillary sinusitis: Secondary | ICD-10-CM | POA: Insufficient documentation

## 2011-10-12 DIAGNOSIS — J019 Acute sinusitis, unspecified: Secondary | ICD-10-CM

## 2011-10-12 DIAGNOSIS — K219 Gastro-esophageal reflux disease without esophagitis: Secondary | ICD-10-CM | POA: Insufficient documentation

## 2011-10-12 DIAGNOSIS — B1920 Unspecified viral hepatitis C without hepatic coma: Secondary | ICD-10-CM | POA: Insufficient documentation

## 2011-10-12 DIAGNOSIS — E119 Type 2 diabetes mellitus without complications: Secondary | ICD-10-CM | POA: Insufficient documentation

## 2011-10-12 DIAGNOSIS — I10 Essential (primary) hypertension: Secondary | ICD-10-CM | POA: Insufficient documentation

## 2011-10-12 DIAGNOSIS — J322 Chronic ethmoidal sinusitis: Secondary | ICD-10-CM | POA: Insufficient documentation

## 2011-10-12 HISTORY — DX: Anemia, unspecified: D64.9

## 2011-10-12 HISTORY — DX: Headache: R51

## 2011-10-12 HISTORY — DX: Gastro-esophageal reflux disease without esophagitis: K21.9

## 2011-10-12 HISTORY — DX: Benign prostatic hyperplasia without lower urinary tract symptoms: N40.0

## 2011-10-12 HISTORY — DX: Hyperlipidemia, unspecified: E78.5

## 2011-10-12 HISTORY — PX: ETHMOIDECTOMY: SHX5197

## 2011-10-12 HISTORY — DX: Inflammatory liver disease, unspecified: K75.9

## 2011-10-12 HISTORY — DX: Pneumonia, unspecified organism: J18.9

## 2011-10-12 LAB — COMPREHENSIVE METABOLIC PANEL
ALT: 71 U/L — ABNORMAL HIGH (ref 0–53)
AST: 51 U/L — ABNORMAL HIGH (ref 0–37)
Albumin: 3.6 g/dL (ref 3.5–5.2)
Alkaline Phosphatase: 69 U/L (ref 39–117)
BUN: 10 mg/dL (ref 6–23)
CO2: 25 mEq/L (ref 19–32)
Calcium: 9.2 mg/dL (ref 8.4–10.5)
Chloride: 104 mEq/L (ref 96–112)
Creatinine, Ser: 0.89 mg/dL (ref 0.50–1.35)
GFR calc Af Amer: >90 mL/min (ref >90)
GFR calc non Af Amer: 87 mL/min — ABNORMAL LOW (ref >90)
Glucose, Bld: 151 mg/dL — ABNORMAL HIGH (ref 70–99)
Potassium: 3.9 mEq/L (ref 3.5–5.1)
Sodium: 136 mEq/L (ref 135–145)
Total Bilirubin: 0.2 mg/dL — ABNORMAL LOW (ref 0.3–1.2)
Total Protein: 8 g/dL (ref 6.0–8.3)

## 2011-10-12 LAB — CBC
HCT: 33.1 % — ABNORMAL LOW (ref 39.0–52.0)
Hemoglobin: 10.7 g/dL — ABNORMAL LOW (ref 13.0–17.0)
MCH: 27.2 pg (ref 26.0–34.0)
MCHC: 32.3 g/dL (ref 30.0–36.0)
MCV: 84.2 fL (ref 78.0–100.0)
Platelets: 157 10*3/uL (ref 150–400)
RBC: 3.93 MIL/uL — ABNORMAL LOW (ref 4.22–5.81)
RDW: 14.8 % (ref 11.5–15.5)
WBC: 3.7 10*3/uL — ABNORMAL LOW (ref 4.0–10.5)

## 2011-10-12 LAB — GLUCOSE, CAPILLARY
Glucose-Capillary: 140 mg/dL — ABNORMAL HIGH (ref 70–99)
Glucose-Capillary: 149 mg/dL — ABNORMAL HIGH (ref 70–99)
Glucose-Capillary: 126 mg/dL — ABNORMAL HIGH (ref 70–99)

## 2011-10-12 LAB — DIFFERENTIAL
Basophils Absolute: 0 10*3/uL (ref 0.0–0.1)
Basophils Relative: 0 % (ref 0–1)
Eosinophils Absolute: 0.2 10*3/uL (ref 0.0–0.7)
Eosinophils Relative: 4 % (ref 0–5)
Lymphocytes Relative: 36 % (ref 12–46)
Lymphs Abs: 1.3 10*3/uL (ref 0.7–4.0)
Monocytes Absolute: 0.5 10*3/uL (ref 0.1–1.0)
Monocytes Relative: 13 % — ABNORMAL HIGH (ref 3–12)
Neutro Abs: 1.7 10*3/uL (ref 1.7–7.7)
Neutrophils Relative %: 47 % (ref 43–77)

## 2011-10-12 LAB — SURGICAL PCR SCREEN
MRSA, PCR: NEGATIVE
Staphylococcus aureus: NEGATIVE

## 2011-10-12 SURGERY — ETHMOIDECTOMY
Anesthesia: Monitor Anesthesia Care | Site: Nose | Laterality: Bilateral | Wound class: Clean Contaminated

## 2011-10-12 MED ORDER — FERROUS SULFATE 325 (65 FE) MG PO TABS
325.0000 mg | ORAL_TABLET | Freq: Two times a day (BID) | ORAL | Status: DC
  Administered 2011-10-12 (×2): 325 mg via ORAL
  Filled 2011-10-12 (×4): qty 1

## 2011-10-12 MED ORDER — HEMOSTATIC AGENTS (NO CHARGE) OPTIME
TOPICAL | Status: DC | PRN
  Administered 2011-10-12: 1 via TOPICAL

## 2011-10-12 MED ORDER — PROPOFOL 10 MG/ML IV EMUL
INTRAVENOUS | Status: DC | PRN
  Administered 2011-10-12: 75 ug/kg/min via INTRAVENOUS

## 2011-10-12 MED ORDER — SODIUM CHLORIDE 0.9 % IV SOLN
INTRAVENOUS | Status: DC
  Administered 2011-10-12 – 2011-10-13 (×2): via INTRAVENOUS

## 2011-10-12 MED ORDER — PANTOPRAZOLE SODIUM 40 MG PO TBEC
40.0000 mg | DELAYED_RELEASE_TABLET | Freq: Every day | ORAL | Status: DC
  Administered 2011-10-12: 40 mg via ORAL
  Filled 2011-10-12: qty 1

## 2011-10-12 MED ORDER — CEFAZOLIN SODIUM 1-5 GM-% IV SOLN
1.0000 g | Freq: Three times a day (TID) | INTRAVENOUS | Status: DC
  Administered 2011-10-12 – 2011-10-13 (×3): 1 g via INTRAVENOUS
  Filled 2011-10-12 (×4): qty 50

## 2011-10-12 MED ORDER — COCAINE HCL POWD
Status: DC | PRN
  Administered 2011-10-12: 200 mg via NASAL

## 2011-10-12 MED ORDER — CEFAZOLIN SODIUM 1-5 GM-% IV SOLN
INTRAVENOUS | Status: AC
Start: 2011-10-12 — End: 2011-10-12
  Filled 2011-10-12: qty 50

## 2011-10-12 MED ORDER — COCAINE HCL POWD
Status: AC
Start: 2011-10-12 — End: 2011-10-12
  Filled 2011-10-12: qty 200

## 2011-10-12 MED ORDER — BACITRACIN ZINC 500 UNIT/GM EX OINT
TOPICAL_OINTMENT | CUTANEOUS | Status: DC | PRN
  Administered 2011-10-12: 1 via TOPICAL

## 2011-10-12 MED ORDER — MIDAZOLAM HCL 5 MG/5ML IJ SOLN
INTRAMUSCULAR | Status: DC | PRN
  Administered 2011-10-12: 2 mg via INTRAVENOUS

## 2011-10-12 MED ORDER — MUPIROCIN 2 % EX OINT
TOPICAL_OINTMENT | CUTANEOUS | Status: AC
  Administered 2011-10-12: 1 via NASAL
  Filled 2011-10-12: qty 22

## 2011-10-12 MED ORDER — CEFAZOLIN SODIUM 1-5 GM-% IV SOLN
INTRAVENOUS | Status: DC | PRN
  Administered 2011-10-12: 1 g via INTRAVENOUS

## 2011-10-12 MED ORDER — METFORMIN HCL ER 500 MG PO TB24
500.0000 mg | ORAL_TABLET | Freq: Two times a day (BID) | ORAL | Status: DC
  Administered 2011-10-12 – 2011-10-13 (×2): 500 mg via ORAL
  Filled 2011-10-12 (×4): qty 1

## 2011-10-12 MED ORDER — FENTANYL CITRATE 0.05 MG/ML IJ SOLN: 50.0000 ug | INTRAMUSCULAR | Status: DC | PRN

## 2011-10-12 MED ORDER — LOSARTAN POTASSIUM 50 MG PO TABS
50.0000 mg | ORAL_TABLET | Freq: Every morning | ORAL | Status: DC
  Filled 2011-10-12 (×2): qty 1

## 2011-10-12 MED ORDER — LACTATED RINGERS IV SOLN
INTRAVENOUS | Status: DC | PRN
  Administered 2011-10-12: 07:00:00 via INTRAVENOUS

## 2011-10-12 MED ORDER — TERAZOSIN HCL 5 MG PO CAPS
5.0000 mg | ORAL_CAPSULE | Freq: Two times a day (BID) | ORAL | Status: DC
  Administered 2011-10-12: 5 mg via ORAL
  Filled 2011-10-12 (×3): qty 1

## 2011-10-12 MED ORDER — HYDROMORPHONE HCL PF 1 MG/ML IJ SOLN
0.2500 mg | INTRAMUSCULAR | Status: DC | PRN
  Administered 2011-10-12 (×4): 0.5 mg via INTRAVENOUS

## 2011-10-12 MED ORDER — GLIMEPIRIDE 2 MG PO TABS
2.0000 mg | ORAL_TABLET | Freq: Every day | ORAL | Status: DC
  Administered 2011-10-13: 2 mg via ORAL
  Filled 2011-10-12 (×2): qty 1

## 2011-10-12 MED ORDER — MIDAZOLAM HCL 2 MG/2ML IJ SOLN: 1.0000 mg | INTRAMUSCULAR | Status: DC | PRN

## 2011-10-12 MED ORDER — SUCRALFATE 1 G PO TABS
1.0000 g | ORAL_TABLET | Freq: Four times a day (QID) | ORAL | Status: DC
  Administered 2011-10-12 (×3): 1 g via ORAL
  Filled 2011-10-12 (×7): qty 1

## 2011-10-12 MED ORDER — HYDROCODONE-ACETAMINOPHEN 5-325 MG PO TABS
1.0000 | ORAL_TABLET | ORAL | Status: DC | PRN
  Administered 2011-10-12: 2 via ORAL
  Administered 2011-10-12: 1.5 via ORAL
  Administered 2011-10-12 – 2011-10-13 (×4): 2 via ORAL
  Filled 2011-10-12 (×5): qty 2

## 2011-10-12 MED ORDER — LORAZEPAM 2 MG/ML IJ SOLN
1.0000 mg | Freq: Once | INTRAMUSCULAR | Status: AC | PRN
  Administered 2011-10-12: 0.5 mg via INTRAVENOUS

## 2011-10-12 MED ORDER — GABAPENTIN 300 MG PO CAPS
300.0000 mg | ORAL_CAPSULE | Freq: Three times a day (TID) | ORAL | Status: DC
  Administered 2011-10-12 (×2): 300 mg via ORAL
  Filled 2011-10-12 (×5): qty 1

## 2011-10-12 MED ORDER — LIDOCAINE-EPINEPHRINE 1 %-1:100000 IJ SOLN
INTRAMUSCULAR | Status: DC | PRN
  Administered 2011-10-12: 5.5 mL

## 2011-10-12 MED ORDER — AMLODIPINE BESYLATE 10 MG PO TABS
10.0000 mg | ORAL_TABLET | Freq: Every morning | ORAL | Status: DC
  Filled 2011-10-12 (×2): qty 1

## 2011-10-12 MED ORDER — FENTANYL CITRATE 0.05 MG/ML IJ SOLN
INTRAMUSCULAR | Status: DC | PRN
  Administered 2011-10-12 (×2): 50 ug via INTRAVENOUS

## 2011-10-12 SURGICAL SUPPLY — 29 items
CANISTER SUCTION 2500CC (MISCELLANEOUS) ×2 IMPLANT
CLOTH BEACON ORANGE TIMEOUT ST (SAFETY) ×2 IMPLANT
COAGULATOR SUCT SWTCH 10FR 6 (ELECTROSURGICAL) IMPLANT
DECANTER SPIKE VIAL GLASS SM (MISCELLANEOUS) ×2 IMPLANT
DRESSING NASAL POPE 10X1.5X2.5 (GAUZE/BANDAGES/DRESSINGS) IMPLANT
DRESSING TELFA 8X3 (GAUZE/BANDAGES/DRESSINGS) ×2 IMPLANT
DRSG NASAL POPE 10X1.5X2.5 (GAUZE/BANDAGES/DRESSINGS) ×4
GLOVE SURG SS PI 7.0 STRL IVOR (GLOVE) ×2 IMPLANT
GLOVE SURG SS PI 7.5 STRL IVOR (GLOVE) ×2 IMPLANT
GOWN PREVENTION PLUS XLARGE (GOWN DISPOSABLE) ×1 IMPLANT
GOWN STRL NON-REIN LRG LVL3 (GOWN DISPOSABLE) ×1 IMPLANT
GOWN STRL REIN XL XLG (GOWN DISPOSABLE) ×3 IMPLANT
KIT BASIN OR (CUSTOM PROCEDURE TRAY) ×2 IMPLANT
KIT ROOM TURNOVER OR (KITS) ×2 IMPLANT
NDL SPNL 25GX3.5 QUINCKE BL (NEEDLE) ×1 IMPLANT
NEEDLE SPNL 25GX3.5 QUINCKE BL (NEEDLE) ×2 IMPLANT
NS IRRIG 1000ML POUR BTL (IV SOLUTION) ×2 IMPLANT
PAD ARMBOARD 7.5X6 YLW CONV (MISCELLANEOUS) ×4 IMPLANT
SHEATH ENDOSCRUB 0 DEG (SHEATH) IMPLANT
SHEATH ENDOSCRUB 30 DEG (SHEATH) IMPLANT
SPECIMEN JAR SMALL (MISCELLANEOUS) IMPLANT
SPONGE SURGIFOAM ABS GEL 12-7 (HEMOSTASIS) ×1 IMPLANT
SWAB COLLECTION DEVICE MRSA (MISCELLANEOUS) IMPLANT
SYR 3ML LL SCALE MARK (SYRINGE) ×2 IMPLANT
TOWEL OR 17X24 6PK STRL BLUE (TOWEL DISPOSABLE) ×2 IMPLANT
TOWEL OR 17X26 10 PK STRL BLUE (TOWEL DISPOSABLE) ×2 IMPLANT
TRAY ENT MC OR (CUSTOM PROCEDURE TRAY) ×2 IMPLANT
TUBE ANAEROBIC SPECIMEN COL (MISCELLANEOUS) IMPLANT
WATER STERILE IRR 1000ML POUR (IV SOLUTION) ×2 IMPLANT

## 2011-10-12 NOTE — Anesthesia Preprocedure Evaluation (Signed)
Anesthesia Evaluation  Patient identified by MRN, date of birth, ID band Patient awake    Reviewed: Allergy & Precautions, H&P , NPO status , Patient's Chart, lab work & pertinent test results  Airway Mallampati: II TM Distance: >3 FB Neck ROM: Full    Dental   Pulmonary  + rhonchi         Cardiovascular hypertension,     Neuro/Psych  Headaches,    GI/Hepatic GERD-  ,(+) Hepatitis -, C  Endo/Other  Diabetes mellitus-  Renal/GU      Musculoskeletal   Abdominal   Peds  Hematology  (+) HIV,   Anesthesia Other Findings   Reproductive/Obstetrics                           Anesthesia Physical Anesthesia Plan  ASA: III  Anesthesia Plan: MAC   Post-op Pain Management:    Induction:   Airway Management Planned: Nasal Cannula  Additional Equipment:   Intra-op Plan:   Post-operative Plan:   Informed Consent: I have reviewed the patients History and Physical, chart, labs and discussed the procedure including the risks, benefits and alternatives for the proposed anesthesia with the patient or authorized representative who has indicated his/her understanding and acceptance.     Plan Discussed with: CRNA and Surgeon  Anesthesia Plan Comments:         Anesthesia Quick Evaluation

## 2011-10-12 NOTE — Op Note (Signed)
Dictated [phone]

## 2011-10-12 NOTE — Progress Notes (Signed)
Left pack removed Patient doing well

## 2011-10-12 NOTE — Op Note (Signed)
NAME:  SINCERE, LIUZZI NO.:  [id number]  MEDICAL RECORD NO.:  [id number]  LOCATION:  [facility]                         FACILITY:  [facility]  PHYSICIAN:  Hermelinda Medicus, M.D.   DATE OF BIRTH:  1946/05/01  DATE OF PROCEDURE: DATE OF DISCHARGE:                              OPERATIVE REPORT   This patient is a 66 year old male who has had persistent sinusitis and has had sinus surgery under my care in 2002 but has had recurrent sinus infections just recently and especially on the right side with frontal and maxillary sinus pain.  He is aware of the risks and gains that we were working close to his eyes that we were working close to the base of skull, and he also is diabetic and has health issues that had been cleared.  He is aware of the risks and gains of this anesthesia and postop course.  PREOPERATIVE DIAGNOSES:  Bilateral ethmoid and frontal, right more than left ethmoid and frontal sinusitis with maxillary sinusitis with fluid retention.  POSTOPERATIVE DIAGNOSES:  Bilateral ethmoid and frontal, right more than left ethmoid and frontal sinusitis with maxillary sinusitis with fluid retention.  OPERATION:  Bilateral frontal ethmoidectomy with bilateral maxillary sinus ostium enlargement.  ANESTHESIA:  Local MAC with Dr. Gypsy Balsam.  SURGEON:  Hermelinda Medicus, M.D.  PROCEDURE:  The patient was placed in supine position under local MAC anesthesia.  The patient was prepped and draped in the usual manner and then the anesthesia was used, first using 200 mg of topical cocaine to anesthetize his nose and then 1% Xylocaine with epinephrine was also used to further the anesthesia.  Once he was totally anesthetized, the left side was first approached.  The ethmoid was approached using the 0 degree scope and also we had the CAT scans in view in the operating room, we viewed the ethmoid sinus where the frontal sinus was in reasonably good condition.  There was some scar tissue next  to the frontal sinus natural ostium as well as in the sinus, and we removed this using the 0 degree scope in the upbiting and straight Blakesley Wilders.  Once this was completed, we then approached the left maxillary sinus where the natural ostium was opened, but there was considerable thickened membrane which we removed to get this sinus to drain better. We also gained further drainage by establishing an antrostomy, so he would drain at both levels and we could suction the sinus clear more easily.  When this was completed, we then placed a small piece of Gelfoam within the left ethmoid sinus and approached the right side which was more extensively involved as well as the frontal sinus which was totally filled with fluid.  We were able to remove the scar tissue from the anterior ethmoid region that led into the frontal sinus.  We were able suction the frontal sinus free of fluid and look up there using the 0 and 70 degrees scopes to establish its continuity. Once this was completed, we removed the remainder of the polyps from the left ethmoid sinus and then placed a small 1/2 portion of Gelfoam in this ethmoid sinus.  We then approached the right  maxillary which was filled with debris, primarily a lot of it was able to be suctioned and then the thickened membrane we removed around the natural ostium and did an antrostomy as well to suction this free.  Once we get the drainage going well for the sinus, I feel that the considerable amount of that mucous membrane that is considerably swollen and edematous will shrink back to its more normal status once the sinus drains well.  Once this was completed, our blood loss was established at 25 mL.  The patient tolerated the procedure very well.  We placed two #10 cm Merocel packs, and the patient was taken to the recovery room in good condition.          ______________________________ Hermelinda Medicus, M.D.     JC/MEDQ  D:  10/12/2011  T:   10/12/2011  Job:  [id number]  cc:   Graylin Shiver, M.D. Lupita Raider, M.D. Bryan Lemma. [name], M.D. Conception Oms, Dr.

## 2011-10-12 NOTE — Transfer of Care (Signed)
Immediate Anesthesia Transfer of Care Note  Patient: James [name]  Procedure(s) Performed: Procedure(s) (LRB): ETHMOIDECTOMY (Bilateral)  Patient Location: PACU  Anesthesia Type: MAC  Level of Consciousness: awake and alert   Airway & Oxygen Therapy: Patient Spontanous Breathing  Post-op Assessment: Report given to PACU RN and Post -op Vital signs reviewed and stable  Post vital signs: Reviewed  Complications: No apparent anesthesia complications

## 2011-10-12 NOTE — Anesthesia Postprocedure Evaluation (Signed)
  Anesthesia Post-op Note  Patient: James [name]  Procedure(s) Performed: Procedure(s) (LRB): ETHMOIDECTOMY (Bilateral)  Patient Location: PACU  Anesthesia Type: MAC  Level of Consciousness: awake  Airway and Oxygen Therapy: Patient Spontanous Breathing  Post-op Pain: mild  Post-op Assessment: Post-op Vital signs reviewed, Patient's Cardiovascular Status Stable, Respiratory Function Stable, Patent Airway and Pain level controlled  Post-op Vital Signs: stable  Complications: No apparent anesthesia complications

## 2011-10-13 LAB — GLUCOSE, CAPILLARY: Glucose-Capillary: 151 mg/dL — ABNORMAL HIGH (ref 70–99)

## 2011-10-13 MED ORDER — CEFUROXIME AXETIL 250 MG PO TABS: 250.0000 mg | ORAL_TABLET | Freq: Two times a day (BID) | ORAL | Status: AC

## 2011-10-13 MED ORDER — HYDROCODONE-ACETAMINOPHEN 5-325 MG PO TABS: 1.0000 | ORAL_TABLET | ORAL | Status: AC | PRN

## 2011-10-13 MED FILL — Lorazepam Inj 2 MG/ML: INTRAMUSCULAR | Qty: 1 | Status: AC

## 2011-10-13 NOTE — Discharge Summary (Signed)
NAME:  James [name], James [name] NO.:  MEDICAL RECORD NO.:  [id number]  LOCATION:                                 FACILITY:  PHYSICIAN:  Hermelinda Medicus, M.D.   DATE OF BIRTH:  May 19, 1946  DATE OF ADMISSION: DATE OF DISCHARGE:                              DISCHARGE SUMMARY   This patient is a 66 year old male who has had persistent sinusitis problems.  He has had primarily right ethmoid, frontal, and maxillary sinus infections.  He is a diabetic.  He kept his CBGs under control in the 120-130 range.  He is taking medication for his diabetes and also has a history of unknown source of bleeding.  His hematocrit has been around the low 30s.  He has had a complete colonoscopy, esophagoscopy, gastroscopy trying to find the source of this bleeding, but he has continued to have sinus infections for several years.  He had surgery under my care in 2002 during bilateral ethmoid, frontal, and maxillary sinus.  He has also had hepatitis B and hepatitis C.  He has been cleared by Dr. Corky Sing for surgery and his medications are metformin 500 two tablets a day, glyburide 2 mg once a day, terazosin 5 twice a day, iron 125, gabapentin 300 three a day for pain as he has considerable back pain, omeprazole 20 for reflux, tramadol 30 one tablet 4 times a day, losartan, potassium 50, amlodipine bisulfate 10 once a day, Carafate 4, and Astepro 0.15.  He has had cholecystectomy, right inguinal hernia, colonoscopy, esophagoscopy, complete endoscopy, back surgery on May 11th,  and left cataract surgery.  He is diabetic type 2. He also has hypertension, hyperglyceridemia, back pain, multinodular goiter, FNA was benign, the anemia and no renal manifestations.  PHYSICAL EXAMINATION:  VITAL SIGNS:  Blood pressure 144/78, pulse 86, weight is 215. HEENT:  Ears and canals are completely clear.  Tympanic membranes move well.  Nose looks reasonably good at this time.  He has been on antibiotics.  No  obvious polyps.  He has had persistent drainage and persistent pain especially in the frontal maxillary region, especially on the right.  His nasopharynx and oropharynx does show some purulent drainage, but he is in the best condition we can get him right now.  He has been on antibiotics.  He shows no ulceration, mass, or lesion. CHEST:  Clear.  No rales, rhonchi, or wheezes. CARDIOVASCULAR:  Normal S1 and S2.  No murmurs or gallops.  His EKG was normal sinus rhythm with a right bundle-branch block. EXTREMITIES:  Unremarkable. SKIN:  Color within normal limits. NECK:  No lymph nodes.  Neck is free of any cervical adenopathy or mass.  INITIAL DIAGNOSES:  He also has had a CAT scan, which shows bilateral maxillary, ethmoid, and frontal sinusitis, especially on the right.  He has had history of hepatitis B and C, hypertension, hyperlipidemia, chronic back pain, multinodular goiter, benign biopsy, left cataract surgery, colonoscopy, endoscopy, unexplained loss of blood with hemoglobin around 10 and hematocrit 33, and diabetes mellitus type 2. His lab reveals a CBG 149, 126 on admission.  His MRSA is negative as his Staph.  His electrolytes are completely within normal limits with sodium 136, potassium 3.9.  His white count 3.7, hematocrit 33.1, hemoglobin 10.7, platelets 157.  The differentials within normal limits. He underwent a bilateral frontal ethmoidectomy, bilateral maxillary sinus ostial enlargement under local MAC anesthesia and has done very well postoperatively.  His pulse oximeters postoperatively ranged in the 90s and his blood glucose status was also established in the 126, 149, 140 level.  This a.m. he is 150.  He just had breakfast and his IV is still running.  He has done extremely well and his left pack was removed last night.  His right pack removed this morning.  He had minimal bleeding and will be discharged today from 23- hour observation.  His followup will be on  Monday and then in 1 week, 3 weeks, 6 weeks, 3 months, 6 months, and a year.          ______________________________ Hermelinda Medicus, M.D.     JC/MEDQ  D:  10/13/2011  T:  10/13/2011  Job:  [id number]  cc:   Graylin Shiver, M.D. Bryan Lemma. [name], M.D. Lupita Raider, M.D. Dr. Christell Constant Dr. Pixie Casino

## 2011-10-13 NOTE — Progress Notes (Signed)
Discharge instructions/Med Rec Sheet reviewed w/ pt. Pt expressed understanding and copies given w/ prescriptions. Pt d/c'd in stable condition via w/c, accompanied by discharge volunteers 

## 2011-10-13 NOTE — Discharge Summary (Signed)
Dictation [phone]

## 2011-10-17 ENCOUNTER — Encounter (HOSPITAL_COMMUNITY): Payer: Self-pay | Admitting: Otolaryngology

## 2011-10-25 ENCOUNTER — Encounter (HOSPITAL_COMMUNITY): Payer: Self-pay | Admitting: *Deleted

## 2011-10-25 ENCOUNTER — Emergency Department (HOSPITAL_COMMUNITY): Payer: Medicare Other

## 2011-10-25 ENCOUNTER — Other Ambulatory Visit: Payer: Self-pay

## 2011-10-25 ENCOUNTER — Emergency Department (HOSPITAL_COMMUNITY)
Admission: EM | Admit: 2011-10-25 | Discharge: 2011-10-25 | Disposition: A | Discharge status: Home / Self Care | Payer: Medicare Other | Attending: Emergency Medicine | Admitting: Emergency Medicine

## 2011-10-25 DIAGNOSIS — E785 Hyperlipidemia, unspecified: Secondary | ICD-10-CM | POA: Insufficient documentation

## 2011-10-25 DIAGNOSIS — Z79899 Other long term (current) drug therapy: Secondary | ICD-10-CM | POA: Insufficient documentation

## 2011-10-25 DIAGNOSIS — Z8619 Personal history of other infectious and parasitic diseases: Secondary | ICD-10-CM | POA: Insufficient documentation

## 2011-10-25 DIAGNOSIS — R141 Gas pain: Secondary | ICD-10-CM | POA: Insufficient documentation

## 2011-10-25 DIAGNOSIS — I10 Essential (primary) hypertension: Secondary | ICD-10-CM | POA: Insufficient documentation

## 2011-10-25 DIAGNOSIS — R0602 Shortness of breath: Secondary | ICD-10-CM | POA: Insufficient documentation

## 2011-10-25 DIAGNOSIS — R42 Dizziness and giddiness: Secondary | ICD-10-CM | POA: Insufficient documentation

## 2011-10-25 DIAGNOSIS — K219 Gastro-esophageal reflux disease without esophagitis: Secondary | ICD-10-CM | POA: Insufficient documentation

## 2011-10-25 DIAGNOSIS — R110 Nausea: Secondary | ICD-10-CM | POA: Insufficient documentation

## 2011-10-25 DIAGNOSIS — E119 Type 2 diabetes mellitus without complications: Secondary | ICD-10-CM | POA: Insufficient documentation

## 2011-10-25 DIAGNOSIS — R142 Eructation: Secondary | ICD-10-CM | POA: Insufficient documentation

## 2011-10-25 DIAGNOSIS — G8929 Other chronic pain: Secondary | ICD-10-CM | POA: Insufficient documentation

## 2011-10-25 DIAGNOSIS — R109 Unspecified abdominal pain: Secondary | ICD-10-CM | POA: Insufficient documentation

## 2011-10-25 DIAGNOSIS — M129 Arthropathy, unspecified: Secondary | ICD-10-CM | POA: Insufficient documentation

## 2011-10-25 LAB — URINALYSIS, ROUTINE W REFLEX MICROSCOPIC
Bilirubin Urine: NEGATIVE
Glucose, UA: NEGATIVE mg/dL
Hgb urine dipstick: NEGATIVE
Ketones, ur: NEGATIVE mg/dL
Leukocytes, UA: NEGATIVE
Nitrite: NEGATIVE
Protein, ur: 100 mg/dL — AB
Specific Gravity, Urine: 1.017 (ref 1.005–1.030)
Urobilinogen, UA: 1 mg/dL (ref 0.0–1.0)
pH: 6.5 (ref 5.0–8.0)

## 2011-10-25 LAB — CBC
HCT: 33.8 % — ABNORMAL LOW (ref 39.0–52.0)
Hemoglobin: 11.6 g/dL — ABNORMAL LOW (ref 13.0–17.0)
MCH: 28.2 pg (ref 26.0–34.0)
MCHC: 34.3 g/dL (ref 30.0–36.0)
MCV: 82.2 fL (ref 78.0–100.0)
Platelets: 206 10*3/uL (ref 150–400)
RBC: 4.11 MIL/uL — ABNORMAL LOW (ref 4.22–5.81)
RDW: 14.7 % (ref 11.5–15.5)
WBC: 8.4 10*3/uL (ref 4.0–10.5)

## 2011-10-25 LAB — COMPREHENSIVE METABOLIC PANEL
ALT: 81 U/L — ABNORMAL HIGH (ref 0–53)
AST: 71 U/L — ABNORMAL HIGH (ref 0–37)
Albumin: 4 g/dL (ref 3.5–5.2)
Alkaline Phosphatase: 80 U/L (ref 39–117)
BUN: 10 mg/dL (ref 6–23)
CO2: 23 mEq/L (ref 19–32)
Calcium: 9.6 mg/dL (ref 8.4–10.5)
Chloride: 101 mEq/L (ref 96–112)
Creatinine, Ser: 0.75 mg/dL (ref 0.50–1.35)
GFR calc Af Amer: >90 mL/min (ref >90)
GFR calc non Af Amer: >90 mL/min (ref >90)
Glucose, Bld: 143 mg/dL — ABNORMAL HIGH (ref 70–99)
Potassium: 3.9 mEq/L (ref 3.5–5.1)
Sodium: 135 mEq/L (ref 135–145)
Total Bilirubin: 0.3 mg/dL (ref 0.3–1.2)
Total Protein: 8.8 g/dL — ABNORMAL HIGH (ref 6.0–8.3)

## 2011-10-25 LAB — LIPASE, BLOOD: Lipase: 48 U/L (ref 11–59)

## 2011-10-25 LAB — DIFFERENTIAL
Basophils Absolute: 0 10*3/uL (ref 0.0–0.1)
Basophils Relative: 0 % (ref 0–1)
Eosinophils Absolute: 0 10*3/uL (ref 0.0–0.7)
Eosinophils Relative: 0 % (ref 0–5)
Lymphocytes Relative: 14 % (ref 12–46)
Lymphs Abs: 1.2 10*3/uL (ref 0.7–4.0)
Monocytes Absolute: 0.3 10*3/uL (ref 0.1–1.0)
Monocytes Relative: 4 % (ref 3–12)
Neutro Abs: 6.9 10*3/uL (ref 1.7–7.7)
Neutrophils Relative %: 82 % — ABNORMAL HIGH (ref 43–77)

## 2011-10-25 LAB — URINE MICROSCOPIC-ADD ON

## 2011-10-25 MED ORDER — FENTANYL CITRATE 0.05 MG/ML IJ SOLN
INTRAMUSCULAR | Status: AC
  Filled 2011-10-25: qty 2

## 2011-10-25 MED ORDER — GI COCKTAIL ~~LOC~~
30.0000 mL | Freq: Once | ORAL | Status: AC
  Administered 2011-10-25: 30 mL via ORAL
  Filled 2011-10-25: qty 30

## 2011-10-25 MED ORDER — IOHEXOL 300 MG/ML  SOLN
100.0000 mL | Freq: Once | INTRAMUSCULAR | Status: AC | PRN
  Administered 2011-10-25: 100 mL via INTRAVENOUS

## 2011-10-25 MED ORDER — FENTANYL CITRATE 0.05 MG/ML IJ SOLN
50.0000 ug | Freq: Once | INTRAMUSCULAR | Status: AC
  Administered 2011-10-25: 50 ug via INTRAVENOUS

## 2011-10-25 MED ORDER — ALUM & MAG HYDROXIDE-SIMETH 400-400-40 MG/5ML PO SUSP: 15.0000 mL | Freq: Four times a day (QID) | ORAL | Status: AC | PRN

## 2011-10-25 MED ORDER — ONDANSETRON HCL 4 MG/2ML IJ SOLN
4.0000 mg | Freq: Once | INTRAMUSCULAR | Status: AC
  Administered 2011-10-25: 4 mg via INTRAVENOUS
  Filled 2011-10-25: qty 2

## 2011-10-25 MED ORDER — OXYCODONE-ACETAMINOPHEN 5-325 MG PO TABS
2.0000 | ORAL_TABLET | Freq: Once | ORAL | Status: DC
  Filled 2011-10-25: qty 2

## 2011-10-25 MED ORDER — MAGNESIUM CITRATE PO SOLN: 296.0000 mL | Freq: Once | ORAL | Status: AC

## 2011-10-25 MED ORDER — IOHEXOL 300 MG/ML  SOLN
20.0000 mL | INTRAMUSCULAR | Status: AC
  Administered 2011-10-25: 20 mL via ORAL

## 2011-10-25 NOTE — ED Provider Notes (Signed)
Medical screening examination/treatment/procedure(s) were performed by non-physician practitioner and as supervising physician I was immediately available for consultation/collaboration.   Lyanne Co, MD 10/25/11 1719

## 2011-10-25 NOTE — Discharge Instructions (Signed)

## 2011-10-25 NOTE — ED Provider Notes (Signed)
Pt seen by Osie Bond, PA-C initially. Pt has chronic abdominal pain problems of unknown etiology and is followed by [facility]. Pt placed in CDU to await CT scan of the abdomen/pelvis. Acute abd series suspicious for possible bowel obstruction. CT scan shows to acute abnormalities. Pt is still complaining of pain. Will give pt PO medications and have him follow-up with up with [facility].  Dorthula Matas, PA 10/25/11 1447

## 2011-10-25 NOTE — ED Notes (Signed)
Tatyana PA talked with the pt and he is willing to be discharged home now

## 2011-10-25 NOTE — ED Notes (Signed)
Shown Dr. Clarene [facility] ECG with older one.

## 2011-10-25 NOTE — ED Notes (Signed)
The pt is refusing to be discharged until the PA tells him exactly what is wrong with him and tells him why she didn't prescribe him a gi cocktail for home. Romie Jumper, PA made aware of pt c/os.

## 2011-10-25 NOTE — ED Provider Notes (Signed)
History     CSN: [id number]  Arrival date & time 10/25/11  0917   First MD Initiated Contact with Patient 10/25/11 [phone]      Chief Complaint  Patient presents with  . Abdominal Pain    (Consider location/radiation/quality/duration/timing/severity/associated sxs/prior treatment) Patient is a 66 y.o. male presenting with abdominal pain. The history is provided by the patient.  Abdominal Pain The primary symptoms of the illness include abdominal pain, shortness of breath and nausea. The primary symptoms of the illness do not include fever, vomiting, diarrhea or dysuria.  Symptoms associated with the illness do not include chills, urgency, frequency or back pain.  Pt with chronic abdominal pain, has had visits here and now followed by [facility]. States no one can figure out what is causing his problems, states he has liver disease. States that pain has been worsening in the last week. Had a sinus surger and has been on antibiotics a week ago, thinks this could be related. Admits to nausea, no vomiting. Pain relieved with bowel movements, but statse he has had very small BMs the last two days, and pain when going. States today when was in the bathroom, he became lightheaded, diaphoretic, and had severe abdominal cramping. States that he called his life alert button to come here.   Past Medical History  Diagnosis Date  . Diabetes mellitus   . Hypertension   . Arthritis   . Thyroid disease     goiter  . Hepatitis     C and B  . HLD (hyperlipidemia)   . Anemia   . Pneumonia 07/2011  . GERD (gastroesophageal reflux disease)   . BPH (benign prostatic hyperplasia)   . Headache     migraines, sinus headaches    Past Surgical History  Procedure Date  . Cholecystectomy   . Abdominal surgery   . Hernia repair   . Colonoscopy   . Back surgery     x 5   . Tonsillectomy   . Hand surgery     right finger  . Sinus surgery   . Rotator cuff repair     bilateral  . Cyst removal skull 20  years ago  . Eye surgery     bilat cataract with lens implants  . Ethmoidectomy 10/12/2011    Procedure: ETHMOIDECTOMY;  Surgeon: Keturah Barre, MD;  Location: [facility] OR;  Service: ENT;  Laterality: Bilateral;  bilateral maxillary sinus osteal enlargement, frontal sinusotomy    History reviewed. No pertinent family history.  History  Substance Use Topics  . Smoking status: Former Games developer  . Smokeless tobacco: Not on file  . Alcohol Use: No      Review of Systems  Constitutional: Negative for fever and chills.  HENT: Negative.   Eyes: Negative.   Respiratory: Positive for shortness of breath. Negative for cough and chest tightness.   Cardiovascular: Negative for chest pain and leg swelling.  Gastrointestinal: Positive for nausea and abdominal pain. Negative for vomiting and diarrhea.  Genitourinary: Negative for dysuria, urgency, frequency and flank pain.  Musculoskeletal: Negative for back pain.  Skin: Negative.   Neurological: Positive for dizziness and light-headedness.  Psychiatric/Behavioral: Negative.     Allergies  Review of patient's allergies indicates no known allergies.  Home Medications   Current Outpatient Rx  Name Route Sig Dispense Refill  . AMLODIPINE BESYLATE 10 MG PO TABS Oral Take 10 mg by mouth every morning.     . AZELASTINE HCL 137 MCG/SPRAY NA SOLN Nasal Place 1  spray into the nose 2 (two) times daily as needed. For dry nasal passages    . FERROUS SULFATE 325 (65 FE) MG PO TABS Oral Take 325 mg by mouth 2 (two) times daily.    Marland Kitchen GABAPENTIN 300 MG PO CAPS Oral Take 300 mg by mouth 3 (three) times daily.     Marland Kitchen GLIMEPIRIDE 2 MG PO TABS Oral Take 2 mg by mouth daily before breakfast.     . HYDROCODONE-ACETAMINOPHEN 5-500 MG PO TABS Oral Take 1 tablet by mouth every 6 (six) hours as needed. For pain    . LOSARTAN POTASSIUM 50 MG PO TABS Oral Take 50 mg by mouth every morning.     Marland Kitchen METFORMIN HCL ER 500 MG PO TB24 Oral Take 1,000 mg by mouth 2 (two) times  daily.     Marland Kitchen OMEPRAZOLE 20 MG PO CPDR Oral Take 20 mg by mouth 4 (four) times daily.     . SUCRALFATE 1 G PO TABS Oral Take 1 g by mouth 4 (four) times daily.     Marland Kitchen TAMSULOSIN HCL 0.4 MG PO CAPS Oral Take 0.4 mg by mouth at bedtime.    . TRAMADOL HCL 50 MG PO TABS Oral Take 50 mg by mouth every 6 (six) hours as needed. For pain      BP 170/65  Pulse 82  Temp 98.2 F (36.8 C)  Resp 18  SpO2 100%  Physical Exam  Nursing note and vitals reviewed. Constitutional: He is oriented to person, place, and time. He appears well-developed and well-nourished.       Appears uncomfortable, in pain  HENT:  Head: Normocephalic and atraumatic.  Eyes: Conjunctivae are normal.  Neck: Neck supple.  Cardiovascular: Normal rate, regular rhythm and normal heart sounds.   Pulmonary/Chest: Effort normal and breath sounds normal. No respiratory distress.  Abdominal: Soft. Bowel sounds are normal. He exhibits distension.       Abdomen diffusely tender, most tenderness in RUQ and LUQ, some guarding, no rebound tenderness  Musculoskeletal: Normal range of motion. He exhibits no edema.  Neurological: He is alert and oriented to person, place, and time. No cranial nerve deficit. He exhibits normal muscle tone.  Skin: Skin is warm and dry.  Psychiatric: He has a normal mood and affect.    ED Course  Procedures (including critical care time)  Pt with chronic abdominal pain, symptoms worsened today. Will get labs, acute abdominal series to r/o obstruction, hx of surgery and pt reports his abdomen feels distended. Pt hypertensive, otherwise normal VS.   Results for orders placed during the hospital encounter of 10/25/11  CBC      Component Value Range   WBC 8.4  4.0 - 10.5 (K/uL)   RBC 4.11 (*) 4.22 - 5.81 (MIL/uL)   Hemoglobin 11.6 (*) 13.0 - 17.0 (g/dL)   HCT 16.1 (*) 09.6 - 52.0 (%)   MCV 82.2  78.0 - 100.0 (fL)   MCH 28.2  26.0 - 34.0 (pg)   MCHC 34.3  30.0 - 36.0 (g/dL)   RDW 04.5  40.9 - 81.1 (%)    Platelets 206  150 - 400 (K/uL)  DIFFERENTIAL      Component Value Range   Neutrophils Relative 82 (*) 43 - 77 (%)   Neutro Abs 6.9  1.7 - 7.7 (K/uL)   Lymphocytes Relative 14  12 - 46 (%)   Lymphs Abs 1.2  0.7 - 4.0 (K/uL)   Monocytes Relative 4  3 - 12 (%)  Monocytes Absolute 0.3  0.1 - 1.0 (K/uL)   Eosinophils Relative 0  0 - 5 (%)   Eosinophils Absolute 0.0  0.0 - 0.7 (K/uL)   Basophils Relative 0  0 - 1 (%)   Basophils Absolute 0.0  0.0 - 0.1 (K/uL)  COMPREHENSIVE METABOLIC PANEL      Component Value Range   Sodium 135  135 - 145 (mEq/L)   Potassium 3.9  3.5 - 5.1 (mEq/L)   Chloride 101  96 - 112 (mEq/L)   CO2 23  19 - 32 (mEq/L)   Glucose, Bld 143 (*) 70 - 99 (mg/dL)   BUN 10  6 - 23 (mg/dL)   Creatinine, Ser 0.98  0.50 - 1.35 (mg/dL)   Calcium 9.6  8.4 - 11.9 (mg/dL)   Total Protein 8.8 (*) 6.0 - 8.3 (g/dL)   Albumin 4.0  3.5 - 5.2 (g/dL)   AST 71 (*) 0 - 37 (U/L)   ALT 81 (*) 0 - 53 (U/L)   Alkaline Phosphatase 80  39 - 117 (U/L)   Total Bilirubin 0.3  0.3 - 1.2 (mg/dL)   GFR calc non Af Amer >90  >90 (mL/min)   GFR calc Af Amer >90  >90 (mL/min)  LIPASE, BLOOD      Component Value Range   Lipase 48  11 - 59 (U/L)  URINALYSIS, ROUTINE W REFLEX MICROSCOPIC      Component Value Range   Color, Urine YELLOW  YELLOW    APPearance CLEAR  CLEAR    Specific Gravity, Urine 1.017  1.005 - 1.030    pH 6.5  5.0 - 8.0    Glucose, UA NEGATIVE  NEGATIVE (mg/dL)   Hgb urine dipstick NEGATIVE  NEGATIVE    Bilirubin Urine NEGATIVE  NEGATIVE    Ketones, ur NEGATIVE  NEGATIVE (mg/dL)   Protein, ur 147 (*) NEGATIVE (mg/dL)   Urobilinogen, UA 1.0  0.0 - 1.0 (mg/dL)   Nitrite NEGATIVE  NEGATIVE    Leukocytes, UA NEGATIVE  NEGATIVE   URINE MICROSCOPIC-ADD ON      Component Value Range   Squamous Epithelial / LPF RARE  RARE    WBC, UA 0-2  <3 (WBC/hpf)   RBC / HPF 0-2  <3 (RBC/hpf)   Bacteria, UA RARE  RARE      Dg Abd Acute W/chest  10/25/2011  *RADIOLOGY REPORT*  Clinical  Data: Mid abdominal pain.  ACUTE ABDOMEN SERIES (ABDOMEN 2 VIEW & CHEST 1 VIEW)  Comparison: Chest radiograph 10/12/2011 and acute abdominal series 06/30/2011.  Findings: Frontal view of the chest shows midline trachea.  Heart size stable.  Thoracic aorta is calcified.  Pleural parenchymal scarring at the base of the left hemithorax.  Lungs are otherwise clear.  Two views of the abdomen show minimally prominent scattered small bowel loops and air-fluid levels.  Stool is seen in the majority of the colon.  IMPRESSION: Mild scattered small bowel prominence may be due to constipation. Early or partial small bowel obstruction cannot be definitively excluded.  Original Report Authenticated By: Reyes Ivan, M.D.    Abdominal series showed possible obstruction vs constipation. Will get CT scan, pt continues to have pain. No nausea or vomiting.  Pt moved to CDU for further treatment and waiting on CT.  Pt's CT negative. He was discharged by PA Green. However, pt is frustrated since no cause found for his pain and refused to leave. I spoke with pt, explained that we did all necessary tests for evaluation  needed in ER to make sure he does not have any life threatening processes. His pain has been there for a year, pain is currently under control. Explained to him that his specialists that are seeing him for this will be able to do more for him and instructed to follow up with them as soon as able. We will give him some maalox which helps his symptoms and something for possible constipation. He agrees with the plan. Will d/c home.   Diagnosis: Abdominal pain    MDM    PT with chronic abdominal pain, worsened today.  Mildly elevated liver enzymes, which is chronic. UA negative. CT abdomen/pelvis negative. Vital signs are normal other then hypertensive, last BP 149/74.  Will d/c home with follow up at [facility].      Lottie Mussel, PA 10/25/11 1708

## 2011-10-25 NOTE — ED Provider Notes (Signed)
Medical screening examination/treatment/procedure(s) were performed by non-physician practitioner and as supervising physician I was immediately available for consultation/collaboration.   Lyanne Co, MD 10/25/11 [phone]

## 2011-10-25 NOTE — ED Notes (Signed)
Pt arrived by [facility], reports onset of abd pain since 0100 which has also caused some sob. Reports having sinus surgery last week and feels like blood is draining into his stomach and causing pain and nausea. Reports blood in stool x 1 year. Iv started pta.

## 2011-12-04 DIAGNOSIS — K5900 Constipation, unspecified: Secondary | ICD-10-CM | POA: Insufficient documentation

## 2012-02-09 ENCOUNTER — Telehealth: Payer: Self-pay | Admitting: Oncology

## 2012-02-09 ENCOUNTER — Ambulatory Visit (HOSPITAL_BASED_OUTPATIENT_CLINIC_OR_DEPARTMENT_OTHER): Payer: Medicare Other | Admitting: Oncology

## 2012-02-09 ENCOUNTER — Other Ambulatory Visit: Payer: Medicare Other | Admitting: Lab

## 2012-02-09 VITALS — BP 149/68 | HR 79 | Temp 97.9°F | Ht 72.0 in | Wt 213.8 lb

## 2012-02-09 DIAGNOSIS — K922 Gastrointestinal hemorrhage, unspecified: Secondary | ICD-10-CM

## 2012-02-09 DIAGNOSIS — D729 Disorder of white blood cells, unspecified: Secondary | ICD-10-CM

## 2012-02-09 DIAGNOSIS — D472 Monoclonal gammopathy: Secondary | ICD-10-CM

## 2012-02-09 DIAGNOSIS — D649 Anemia, unspecified: Secondary | ICD-10-CM

## 2012-02-09 LAB — CBC WITH DIFFERENTIAL/PLATELET
BASO%: 0.4 % (ref 0.0–2.0)
Basophils Absolute: 0 10*3/uL (ref 0.0–0.1)
EOS%: 3.7 % (ref 0.0–7.0)
Eosinophils Absolute: 0.1 10*3/uL (ref 0.0–0.5)
HCT: 29.6 % — ABNORMAL LOW (ref 38.4–49.9)
HGB: 9.7 g/dL — ABNORMAL LOW (ref 13.0–17.1)
LYMPH%: 32.4 % (ref 14.0–49.0)
MCH: 30 pg (ref 27.2–33.4)
MCHC: 32.8 g/dL (ref 32.0–36.0)
MCV: 91.4 fL (ref 79.3–98.0)
MONO#: 0.4 10*3/uL (ref 0.1–0.9)
MONO%: 9.2 % (ref 0.0–14.0)
NEUT#: 2.1 10*3/uL (ref 1.5–6.5)
NEUT%: 54.3 % (ref 39.0–75.0)
Platelets: 191 10*3/uL (ref 140–400)
RBC: 3.24 10*6/uL — ABNORMAL LOW (ref 4.20–5.82)
RDW: 14.5 % (ref 11.0–14.6)
WBC: 3.8 10*3/uL — ABNORMAL LOW (ref 4.0–10.3)
lymph#: 1.2 10*3/uL (ref 0.9–3.3)

## 2012-02-09 NOTE — Progress Notes (Signed)
Hematology and Oncology Follow Up Visit  James [name] [id number] 04-17-1946 66 y.o. 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 He has elevated IgG and IgA   Interim History:  James [name] returns today for a follow up visit. He still undergoing Gi work up to determine the etiology of his GI bleeding. No recent hospitalizations or illness.  He reports no pathological fractures, no recent infections. He still has some blood in the stool. He following up at [facility] regarding his GI bleeding issues. He did not have any infections or hospitalizations. He still complains of chronic abdominal pain.   Medications: I have reviewed the patient's current medications. Current outpatient prescriptions:amLODipine (NORVASC) 10 MG tablet, Take 10 mg by mouth every morning. , Disp: , Rfl: ;  azelastine (ASTEPRO) 137 MCG/SPRAY nasal spray, Place 1 spray into the nose 2 (two) times daily as needed. For dry nasal passages, Disp: , Rfl: ;  ferrous sulfate 325 (65 FE) MG tablet, Take 325 mg by mouth 2 (two) times daily., Disp: , Rfl:  gabapentin (NEURONTIN) 300 MG capsule, Take 300 mg by mouth 3 (three) times daily. , Disp: , Rfl: ;  glimepiride (AMARYL) 2 MG tablet, Take 2 mg by mouth daily before breakfast. , Disp: , Rfl: ;  HYDROcodone-acetaminophen (VICODIN) 5-500 MG per tablet, Take 1 tablet by mouth every 6 (six) hours as needed. For pain, Disp: , Rfl: ;  losartan (COZAAR) 50 MG tablet, Take 50 mg by mouth every morning. , Disp: , Rfl:  metFORMIN (GLUCOPHAGE-XR) 500 MG 24 hr tablet, Take 1,000 mg by mouth 2 (two) times daily. , Disp: , Rfl: ;  omeprazole (PRILOSEC) 20 MG capsule, Take 20 mg by mouth 4 (four) times daily. , Disp: , Rfl: ;  sucralfate (CARAFATE) 1 G tablet, Take 1 g by mouth 4 (four) times daily. , Disp: , Rfl: ;  Tamsulosin HCl (FLOMAX) 0.4 MG CAPS, Take 0.4 mg by mouth at  bedtime., Disp: , Rfl:  traMADol (ULTRAM) 50 MG tablet, Take 50 mg by mouth every 6 (six) hours as needed. For pain, Disp: , Rfl: ;  DISCONTD: terazosin (HYTRIN) 5 MG capsule, Take 5 mg by mouth 2 (two) times daily. , Disp: , Rfl:   Allergies: No Known Allergies  Past Medical History, Surgical history, Social history, and Family History were reviewed and updated.  Review of Systems: Constitutional:  Negative for fever, chills, night sweats, anorexia, weight loss, pain. Cardiovascular: no chest pain or dyspnea on exertion Respiratory: no cough, shortness of breath, or wheezing Neurological: no TIA or stroke symptoms Dermatological: negative ENT: negative Skin: Negative. Gastrointestinal: no abdominal pain, change in bowel habits, or black or bloody stools Genito-Urinary: no dysuria, trouble voiding, or hematuria Hematological and Lymphatic: negative Breast: negative Musculoskeletal: negative Remaining ROS negative. Physical Exam: Blood pressure 149/68, pulse 79, temperature 97.9 F (36.6 C), height 6' (1.829 m), weight 213 lb 12.8 oz (96.979 kg). ECOG: 1 General appearance: alert Head: Normocephalic, without obvious abnormality, atraumatic Neck: no adenopathy, no carotid bruit, no JVD, supple, symmetrical, trachea midline and thyroid not enlarged, symmetric, no tenderness/mass/nodules Lymph nodes: Cervical, supraclavicular, and axillary nodes normal. Heart:regular rate and rhythm, S1, S2 normal, no murmur, click, rub or gallop Lung:chest clear, no wheezing, rales, normal symmetric air entry, no tachypnea, retractions or cyanosis Abdomin: soft, non-tender, without masses or organomegaly EXT:no erythema, induration, or nodules   Lab Results:  Lab Results  Component Value Date   WBC 3.8* 02/09/2012   HGB 9.7* 02/09/2012   HCT 29.6* 02/09/2012   MCV 91.4 02/09/2012   PLT 191 02/09/2012     Chemistry      Component Value Date/Time   NA 135 10/25/2011 1022   K 3.9 10/25/2011 1022    CL 101 10/25/2011 1022   CO2 23 10/25/2011 1022   BUN 10 10/25/2011 1022   CREATININE 0.75 10/25/2011 1022      Component Value Date/Time   CALCIUM 9.6 10/25/2011 1022   ALKPHOS 80 10/25/2011 1022   AST 71* 10/25/2011 1022   ALT 81* 10/25/2011 1022   BILITOT 0.3 10/25/2011 1022      Impression and Plan:  66 year old with:  1. IgG and IgA Kappa monoclonal gammopathy. This less likely represent a true plasma cell disorder. No end organ damage to suggest myeloma. This could represent MGUS however. No further work up is needed now. I will repeat these studies in 6 months. He does appear to have more a polyclonal increase.  I think this is likely amyloid as well.   2.   GI bleeding. I do not see any link between that and his monoclonal gammopathy. His GI work up is ongoing.  3.  Anemia: related to #2. Certainly he can benefit from Fe supplementation. We will continue to monitor it for now.    [facility], MD 7/12/201310:11 AM

## 2012-02-09 NOTE — Telephone Encounter (Signed)
appts made and printed for pt aom °

## 2012-02-13 LAB — COMPREHENSIVE METABOLIC PANEL
ALT: 46 U/L (ref 0–53)
AST: 37 U/L (ref 0–37)
Albumin: 4 g/dL (ref 3.5–5.2)
Alkaline Phosphatase: 58 U/L (ref 39–117)
BUN: 14 mg/dL (ref 6–23)
CO2: 21 mEq/L (ref 19–32)
Calcium: 9.1 mg/dL (ref 8.4–10.5)
Chloride: 106 mEq/L (ref 96–112)
Creatinine, Ser: 0.99 mg/dL (ref 0.50–1.35)
Glucose, Bld: 144 mg/dL — ABNORMAL HIGH (ref 70–99)
Potassium: 4.2 mEq/L (ref 3.5–5.3)
Sodium: 137 mEq/L (ref 135–145)
Total Bilirubin: 0.3 mg/dL (ref 0.3–1.2)
Total Protein: 8 g/dL (ref 6.0–8.3)

## 2012-02-13 LAB — SPEP & IFE WITH QIG
Albumin ELP: 47.4 % — ABNORMAL LOW (ref 55.8–66.1)
Alpha-1-Globulin: 3.7 % (ref 2.9–4.9)
Alpha-2-Globulin: 9 % (ref 7.1–11.8)
Beta 2: 10.3 % — ABNORMAL HIGH (ref 3.2–6.5)
Beta Globulin: 6.4 % (ref 4.7–7.2)
Gamma Globulin: 23.2 % — ABNORMAL HIGH (ref 11.1–18.8)
IgA: 749 mg/dL — ABNORMAL HIGH (ref 68–379)
IgG (Immunoglobin G), Serum: 2170 mg/dL — ABNORMAL HIGH (ref 650–1600)
IgM, Serum: 37 mg/dL — ABNORMAL LOW (ref 41–251)
M-Spike, %: 0.46 g/dL
Total Protein, Serum Electrophoresis: 8 g/dL (ref 6.0–8.3)

## 2012-02-13 LAB — KAPPA/LAMBDA LIGHT CHAINS
Kappa free light chain: 7.16 mg/dL — ABNORMAL HIGH (ref 0.33–1.94)
Kappa:Lambda Ratio: 2.16 — ABNORMAL HIGH (ref 0.26–1.65)
Lambda Free Lght Chn: 3.31 mg/dL — ABNORMAL HIGH (ref 0.57–2.63)

## 2012-03-02 ENCOUNTER — Emergency Department (HOSPITAL_COMMUNITY)
Admission: EM | Admit: 2012-03-02 | Discharge: 2012-03-02 | Disposition: A | Discharge status: Home / Self Care | Payer: Medicare Other | Attending: Emergency Medicine | Admitting: Emergency Medicine

## 2012-03-02 DIAGNOSIS — K219 Gastro-esophageal reflux disease without esophagitis: Secondary | ICD-10-CM | POA: Insufficient documentation

## 2012-03-02 DIAGNOSIS — D649 Anemia, unspecified: Secondary | ICD-10-CM | POA: Insufficient documentation

## 2012-03-02 DIAGNOSIS — E079 Disorder of thyroid, unspecified: Secondary | ICD-10-CM | POA: Insufficient documentation

## 2012-03-02 DIAGNOSIS — E119 Type 2 diabetes mellitus without complications: Secondary | ICD-10-CM | POA: Insufficient documentation

## 2012-03-02 DIAGNOSIS — K922 Gastrointestinal hemorrhage, unspecified: Secondary | ICD-10-CM

## 2012-03-02 DIAGNOSIS — Z87891 Personal history of nicotine dependence: Secondary | ICD-10-CM | POA: Insufficient documentation

## 2012-03-02 DIAGNOSIS — N400 Enlarged prostate without lower urinary tract symptoms: Secondary | ICD-10-CM | POA: Insufficient documentation

## 2012-03-02 DIAGNOSIS — R079 Chest pain, unspecified: Secondary | ICD-10-CM | POA: Insufficient documentation

## 2012-03-02 DIAGNOSIS — M129 Arthropathy, unspecified: Secondary | ICD-10-CM | POA: Insufficient documentation

## 2012-03-02 DIAGNOSIS — B1920 Unspecified viral hepatitis C without hepatic coma: Secondary | ICD-10-CM | POA: Insufficient documentation

## 2012-03-02 DIAGNOSIS — I10 Essential (primary) hypertension: Secondary | ICD-10-CM | POA: Insufficient documentation

## 2012-03-02 DIAGNOSIS — E785 Hyperlipidemia, unspecified: Secondary | ICD-10-CM | POA: Insufficient documentation

## 2012-03-02 LAB — CBC
HCT: 26.2 % — ABNORMAL LOW (ref 39.0–52.0)
Hemoglobin: 8.8 g/dL — ABNORMAL LOW (ref 13.0–17.0)
MCH: 30.1 pg (ref 26.0–34.0)
MCHC: 33.6 g/dL (ref 30.0–36.0)
MCV: 89.7 fL (ref 78.0–100.0)
Platelets: 188 10*3/uL (ref 150–400)
RBC: 2.92 MIL/uL — ABNORMAL LOW (ref 4.22–5.81)
RDW: 13.4 % (ref 11.5–15.5)
WBC: 3.3 10*3/uL — ABNORMAL LOW (ref 4.0–10.5)

## 2012-03-02 LAB — BASIC METABOLIC PANEL
BUN: 10 mg/dL (ref 6–23)
CO2: 23 mEq/L (ref 19–32)
Calcium: 9 mg/dL (ref 8.4–10.5)
Chloride: 104 mEq/L (ref 96–112)
Creatinine, Ser: 0.85 mg/dL (ref 0.50–1.35)
GFR calc Af Amer: >90 mL/min (ref >90)
GFR calc non Af Amer: 88 mL/min — ABNORMAL LOW (ref >90)
Glucose, Bld: 160 mg/dL — ABNORMAL HIGH (ref 70–99)
Potassium: 3.9 mEq/L (ref 3.5–5.1)
Sodium: 138 mEq/L (ref 135–145)

## 2012-03-02 LAB — POCT I-STAT TROPONIN I: Troponin i, poc: 0 ng/mL (ref 0.00–0.08)

## 2012-03-02 MED ORDER — OXYCODONE-ACETAMINOPHEN 5-325 MG PO TABS
1.0000 | ORAL_TABLET | Freq: Once | ORAL | Status: AC
  Administered 2012-03-02: 1 via ORAL
  Filled 2012-03-02: qty 1

## 2012-03-02 MED ORDER — NITROGLYCERIN 0.4 MG SL SUBL
0.4000 mg | SUBLINGUAL_TABLET | SUBLINGUAL | Status: DC | PRN
  Administered 2012-03-02 (×2): 0.4 mg via SUBLINGUAL
  Filled 2012-03-02: qty 25

## 2012-03-02 MED ORDER — OXYCODONE-ACETAMINOPHEN 7.5-500 MG PO TABS: 1.0000 | ORAL_TABLET | ORAL | Status: DC | PRN

## 2012-03-02 MED ORDER — HYDROCODONE-ACETAMINOPHEN 5-325 MG PO TABS: 2.0000 | ORAL_TABLET | Freq: Four times a day (QID) | ORAL | Status: AC | PRN

## 2012-03-02 MED ORDER — HYDROCODONE-ACETAMINOPHEN 5-325 MG PO TABS: 2.0000 | ORAL_TABLET | Freq: Four times a day (QID) | ORAL | Status: DC | PRN

## 2012-03-02 MED ORDER — ASPIRIN 325 MG PO TABS
325.0000 mg | ORAL_TABLET | ORAL | Status: DC
  Filled 2012-03-02: qty 1

## 2012-03-02 NOTE — ED Notes (Signed)
Patient was pulled over by [employer] this AM on his way to the hospital.  C/O chest pain.  EMS Called. Patient takes amlodipine at home. No meds in field by EMS.

## 2012-03-02 NOTE — ED Notes (Signed)
Patient states that his chest pain began this AM but he tried to shake it off.  States that pain did not resolve so he decideded to drive himself to the hospital.  He was pulled by [employer] en route. EMS Called.  Patient described pain as a tightness and burning in his left chest that is non-radiating.  Denies nausea, diaphoresis.  Patient denies pain at this time.

## 2012-03-02 NOTE — ED Provider Notes (Signed)
History     CSN: [id number]  Arrival date & time 03/02/12  1136   First MD Initiated Contact with Patient 03/02/12 1203      Chief Complaint  Patient presents with  . Chest Pain    (Consider location/radiation/quality/duration/timing/severity/associated sxs/prior treatment) HPI  This is a 66 year old man, who presents with chest pain which started this morning. He woke up with some chest pain which was burning in nature and he mostly ignored it. However, his chest pain worsened when he was he was carrying a chain saw to his car. He rates the chest pain to be 10 out of 10, but has now reduced to 7/10 after taking 2 tablets of nitroglycerin. He reports history of shortness of breath and little dizziness but he has not passed out. He describes the chest pain to be burning in nature, localized mainly in the sub-sternal region and associated with palpitations. No radiation to his left arm. He reports that he has been feeling tired over the last 3 days, but no history of cough, fever or chills. He admits to feeling some mild chest pain when he walks around but it has never been as severe as today's. He was brought into the ED by ambulance after he was pulled over by a police officer for driving fast. The patient explains that she was rushing to get to the hospital because of his chest pain.  The patient has a complex past medical history significant including diabetes, hypertension, GI bleeding of undetermined cause, and polyclonal gammopathy versus MGUS.  He does not take aspirin because of his GI bleeding.  He reports that he has had multiple work up but without any significant finding for his GI bleeding. It also seems that he has had some cardiac tests including stress tests, but they have no records at this time for review.  Past Medical History  Diagnosis Date  . Diabetes mellitus   . Hypertension   . Arthritis   . Thyroid disease     goiter  . Hepatitis     C and B  . HLD  (hyperlipidemia)   . Anemia   . Pneumonia 07/2011  . GERD (gastroesophageal reflux disease)   . BPH (benign prostatic hyperplasia)   . Headache     migraines, sinus headaches    Past Surgical History  Procedure Date  . Cholecystectomy   . Abdominal surgery   . Hernia repair   . Colonoscopy   . Back surgery     x 5   . Tonsillectomy   . Hand surgery     right finger  . Sinus surgery   . Rotator cuff repair     bilateral  . Cyst removal skull 20 years ago  . Eye surgery     bilat cataract with lens implants  . Ethmoidectomy 10/12/2011    Procedure: ETHMOIDECTOMY;  Surgeon: Keturah Barre, MD;  Location: [facility] OR;  Service: ENT;  Laterality: Bilateral;  bilateral maxillary sinus osteal enlargement, frontal sinusotomy    His mother has a pacemaker on a non-cardiac disorder. There is no further significant family history  History  Substance Use Topics  . Smoking status: Former Games developer  . Smokeless tobacco: Not on file  . Alcohol Use: No      Review of Systems  Constitutional: Positive for diaphoresis and fatigue.  HENT: Negative for neck pain and neck stiffness.   Respiratory: Positive for chest tightness and shortness of breath. Negative for apnea, cough, choking, wheezing and  stridor.   Cardiovascular: Positive for chest pain and palpitations. Negative for leg swelling.  Gastrointestinal: Negative for abdominal distention.  Genitourinary: Negative.  Negative for difficulty urinating.  Musculoskeletal: Negative.   Neurological: Positive for dizziness and light-headedness. Negative for seizures, syncope, facial asymmetry, speech difficulty, weakness and headaches.  Psychiatric/Behavioral: Negative.     Allergies  No known drug allergies  Home Medications   Current Outpatient Rx  Name Route Sig Dispense Refill  . AMLODIPINE BESYLATE 10 MG PO TABS Oral Take 10 mg by mouth every morning.     . AZELASTINE HCL 137 MCG/SPRAY NA SOLN Nasal Place 1 spray into the nose 2  (two) times daily as needed. For dry nasal passages    . FERROUS SULFATE 325 (65 FE) MG PO TABS Oral Take 325 mg by mouth 2 (two) times daily with a meal.     . GABAPENTIN 300 MG PO CAPS Oral Take 300 mg by mouth 3 (three) times daily.     Marland Kitchen GLIMEPIRIDE 2 MG PO TABS Oral Take 2 mg by mouth daily before breakfast.     . LOSARTAN POTASSIUM 50 MG PO TABS Oral Take 50 mg by mouth every morning.     Marland Kitchen METFORMIN HCL ER 500 MG PO TB24 Oral Take 1,000 mg by mouth 2 (two) times daily.     Marland Kitchen OMEPRAZOLE 20 MG PO CPDR Oral Take 20 mg by mouth 4 (four) times daily.     . SUCRALFATE 1 G PO TABS Oral Take 1 g by mouth 4 (four) times daily.     . TRAMADOL HCL 50 MG PO TABS Oral Take 50 mg by mouth 4 (four) times daily -  with meals and at bedtime. For pain      BP 154/66  Pulse 88  Temp 98.4 F (36.9 C) (Oral)  Resp 17  SpO2 99%  Physical Exam  Constitutional: He is oriented to person, place, and time. He appears well-developed and well-nourished. No distress.  HENT:  Head: Normocephalic and atraumatic.  Eyes: Conjunctivae and EOM are normal.  Neck: No JVD present.  Cardiovascular: Normal rate, regular rhythm, normal heart sounds and intact distal pulses.  Exam reveals no gallop and no friction rub.   No murmur heard. Pulmonary/Chest: No stridor. No respiratory distress. He has no wheezes. He has no rales. He exhibits no tenderness.  Abdominal: Soft. Bowel sounds are normal. There is tenderness.  Musculoskeletal: He exhibits no edema and no tenderness.  Neurological: He is alert and oriented to person, place, and time. He has normal reflexes. No cranial nerve deficit. Coordination normal.  Skin: No pallor.    ED Course  Procedures (including critical care time)  Labs Reviewed  CBC - Abnormal; Notable for the following:    WBC 3.3 (*)     RBC 2.92 (*)     Hemoglobin 8.8 (*)  REPEATED TO VERIFY   HCT 26.2 (*)     All other components within normal limits  BASIC METABOLIC PANEL - Abnormal;  Notable for the following:    Glucose, Bld 160 (*)     GFR calc non Af Amer 88 (*)     All other components within normal limits  POCT I-STAT TROPONIN I   EKG: Normal sinus rhythm with a normal heart rate, no acute ST segment or T wave changes. There is an incomplete right bundle branch block. His EKG is unchanged from the one he had in March, 2013.   MDM  This is a  very complex patient who presents with chest pain. He has had mild chest pains on exertion in the past, but his reports they have never been as severe as the one he had this morning. It is very challenging to determine whether this chest pain is cardiac related. The initial troponins and EKG and negative in the ED. Another component to this chest pain is his level of hemoglobin, which is low at 8.8 - associated with his upper GI bleeding. The patient has been offered admission for chest pain for rule out, but he doesn't want to be admitted. My suspicion for unstable angina is low, therefore I don't feel very compelled to admit the patient for further workup against his wishes. He can followup with his primary care physician for evaluation for coronary artery disease as outpatient given his multiple risk factors for this including hypertension, and diabetes.        Dow Adolph, MD 03/02/12 [phone]
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
## 2012-03-13 DIAGNOSIS — K31819 Angiodysplasia of stomach and duodenum without bleeding: Secondary | ICD-10-CM | POA: Insufficient documentation

## 2012-07-01 ENCOUNTER — Other Ambulatory Visit: Payer: Self-pay | Admitting: Pain Medicine

## 2012-07-01 DIAGNOSIS — M545 Low back pain, unspecified: Secondary | ICD-10-CM

## 2012-07-05 ENCOUNTER — Ambulatory Visit
Admission: RE | Admit: 2012-07-05 | Discharge: 2012-07-05 | Disposition: A | Discharge status: Home / Self Care | Payer: Medicare Other | Source: Ambulatory Visit | Attending: Pain Medicine | Admitting: Pain Medicine

## 2012-07-05 DIAGNOSIS — M545 Low back pain, unspecified: Secondary | ICD-10-CM

## 2012-07-17 ENCOUNTER — Emergency Department (HOSPITAL_COMMUNITY): Payer: No Typology Code available for payment source

## 2012-07-17 ENCOUNTER — Encounter (HOSPITAL_COMMUNITY): Payer: Self-pay | Admitting: Emergency Medicine

## 2012-07-17 ENCOUNTER — Emergency Department (HOSPITAL_COMMUNITY)
Admission: EM | Admit: 2012-07-17 | Discharge: 2012-07-17 | Disposition: A | Discharge status: Home / Self Care | Payer: No Typology Code available for payment source | Attending: Emergency Medicine | Admitting: Emergency Medicine

## 2012-07-17 DIAGNOSIS — IMO0002 Reserved for concepts with insufficient information to code with codable children: Secondary | ICD-10-CM | POA: Insufficient documentation

## 2012-07-17 DIAGNOSIS — E119 Type 2 diabetes mellitus without complications: Secondary | ICD-10-CM | POA: Insufficient documentation

## 2012-07-17 DIAGNOSIS — N400 Enlarged prostate without lower urinary tract symptoms: Secondary | ICD-10-CM | POA: Insufficient documentation

## 2012-07-17 DIAGNOSIS — Z8701 Personal history of pneumonia (recurrent): Secondary | ICD-10-CM | POA: Insufficient documentation

## 2012-07-17 DIAGNOSIS — J189 Pneumonia, unspecified organism: Secondary | ICD-10-CM | POA: Insufficient documentation

## 2012-07-17 DIAGNOSIS — Z8619 Personal history of other infectious and parasitic diseases: Secondary | ICD-10-CM | POA: Insufficient documentation

## 2012-07-17 DIAGNOSIS — I10 Essential (primary) hypertension: Secondary | ICD-10-CM | POA: Insufficient documentation

## 2012-07-17 DIAGNOSIS — Z79899 Other long term (current) drug therapy: Secondary | ICD-10-CM | POA: Insufficient documentation

## 2012-07-17 DIAGNOSIS — Z8739 Personal history of other diseases of the musculoskeletal system and connective tissue: Secondary | ICD-10-CM | POA: Insufficient documentation

## 2012-07-17 DIAGNOSIS — M25512 Pain in left shoulder: Secondary | ICD-10-CM

## 2012-07-17 DIAGNOSIS — S46909A Unspecified injury of unspecified muscle, fascia and tendon at shoulder and upper arm level, unspecified arm, initial encounter: Secondary | ICD-10-CM | POA: Insufficient documentation

## 2012-07-17 DIAGNOSIS — Z862 Personal history of diseases of the blood and blood-forming organs and certain disorders involving the immune mechanism: Secondary | ICD-10-CM | POA: Insufficient documentation

## 2012-07-17 DIAGNOSIS — Z87891 Personal history of nicotine dependence: Secondary | ICD-10-CM | POA: Insufficient documentation

## 2012-07-17 DIAGNOSIS — S298XXA Other specified injuries of thorax, initial encounter: Secondary | ICD-10-CM | POA: Insufficient documentation

## 2012-07-17 DIAGNOSIS — D649 Anemia, unspecified: Secondary | ICD-10-CM | POA: Insufficient documentation

## 2012-07-17 DIAGNOSIS — Y92410 Unspecified street and highway as the place of occurrence of the external cause: Secondary | ICD-10-CM | POA: Insufficient documentation

## 2012-07-17 DIAGNOSIS — Z8639 Personal history of other endocrine, nutritional and metabolic disease: Secondary | ICD-10-CM | POA: Insufficient documentation

## 2012-07-17 DIAGNOSIS — E785 Hyperlipidemia, unspecified: Secondary | ICD-10-CM | POA: Insufficient documentation

## 2012-07-17 DIAGNOSIS — K219 Gastro-esophageal reflux disease without esophagitis: Secondary | ICD-10-CM | POA: Insufficient documentation

## 2012-07-17 DIAGNOSIS — Y9389 Activity, other specified: Secondary | ICD-10-CM | POA: Insufficient documentation

## 2012-07-17 DIAGNOSIS — Z8679 Personal history of other diseases of the circulatory system: Secondary | ICD-10-CM | POA: Insufficient documentation

## 2012-07-17 DIAGNOSIS — S4980XA Other specified injuries of shoulder and upper arm, unspecified arm, initial encounter: Secondary | ICD-10-CM | POA: Insufficient documentation

## 2012-07-17 LAB — CBC
HCT: 36.8 % — ABNORMAL LOW (ref 39.0–52.0)
Hemoglobin: 12.5 g/dL — ABNORMAL LOW (ref 13.0–17.0)
MCH: 27.8 pg (ref 26.0–34.0)
MCHC: 34 g/dL (ref 30.0–36.0)
MCV: 82 fL (ref 78.0–100.0)
Platelets: 154 10*3/uL (ref 150–400)
RBC: 4.49 MIL/uL (ref 4.22–5.81)
RDW: 15.9 % — ABNORMAL HIGH (ref 11.5–15.5)
WBC: 4.2 10*3/uL (ref 4.0–10.5)

## 2012-07-17 MED ORDER — ALBUTEROL SULFATE (5 MG/ML) 0.5% IN NEBU
5.0000 mg | INHALATION_SOLUTION | Freq: Once | RESPIRATORY_TRACT | Status: AC
  Administered 2012-07-17: 5 mg via RESPIRATORY_TRACT
  Filled 2012-07-17: qty 1

## 2012-07-17 MED ORDER — IPRATROPIUM BROMIDE 0.02 % IN SOLN
0.5000 mg | Freq: Once | RESPIRATORY_TRACT | Status: AC
  Administered 2012-07-17: 0.5 mg via RESPIRATORY_TRACT
  Filled 2012-07-17: qty 2.5

## 2012-07-17 MED ORDER — ALBUTEROL SULFATE HFA 108 (90 BASE) MCG/ACT IN AERS
2.0000 | INHALATION_SPRAY | RESPIRATORY_TRACT | Status: DC | PRN
  Administered 2012-07-17: 2 via RESPIRATORY_TRACT
  Filled 2012-07-17: qty 6.7

## 2012-07-17 MED ORDER — ONDANSETRON HCL 4 MG/2ML IJ SOLN
4.0000 mg | Freq: Once | INTRAMUSCULAR | Status: AC
  Administered 2012-07-17: 4 mg via INTRAVENOUS
  Filled 2012-07-17: qty 2

## 2012-07-17 MED ORDER — HYDROMORPHONE HCL PF 1 MG/ML IJ SOLN
1.0000 mg | Freq: Once | INTRAMUSCULAR | Status: AC
  Administered 2012-07-17: 1 mg via INTRAVENOUS
  Filled 2012-07-17: qty 1
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
## 2012-07-17 NOTE — ED Notes (Signed)
md at bedside  Pt alert and oriented x4. Respirations even and unlabored, bilateral symmetrical rise and fall of chest. Skin warm and dry. In no acute distress. Denies needs.   

## 2012-07-17 NOTE — ED Notes (Signed)
Pt reports upset stomach, md made aware
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
## 2012-07-17 NOTE — ED Notes (Addendum)
Pt escorted to discharge window. Pt verbalized understanding discharge instructions. In no acute distress.  

## 2012-08-09 ENCOUNTER — Ambulatory Visit (HOSPITAL_BASED_OUTPATIENT_CLINIC_OR_DEPARTMENT_OTHER): Payer: Medicare Other | Admitting: Oncology

## 2012-08-09 ENCOUNTER — Other Ambulatory Visit (HOSPITAL_BASED_OUTPATIENT_CLINIC_OR_DEPARTMENT_OTHER): Payer: Medicare Other | Admitting: Lab

## 2012-08-09 ENCOUNTER — Telehealth: Payer: Self-pay | Admitting: Oncology

## 2012-08-09 VITALS — BP 152/82 | HR 89 | Temp 97.1°F | Resp 20 | Ht 72.0 in | Wt 207.7 lb

## 2012-08-09 DIAGNOSIS — D472 Monoclonal gammopathy: Secondary | ICD-10-CM

## 2012-08-09 DIAGNOSIS — D729 Disorder of white blood cells, unspecified: Secondary | ICD-10-CM

## 2012-08-09 LAB — CBC WITH DIFFERENTIAL/PLATELET
BASO%: 0.3 % (ref 0.0–2.0)
Basophils Absolute: 0 10*3/uL (ref 0.0–0.1)
EOS%: 1.1 % (ref 0.0–7.0)
Eosinophils Absolute: 0 10*3/uL (ref 0.0–0.5)
HCT: 37.4 % — ABNORMAL LOW (ref 38.4–49.9)
HGB: 13 g/dL (ref 13.0–17.1)
LYMPH%: 32.6 % (ref 14.0–49.0)
MCH: 30.2 pg (ref 27.2–33.4)
MCHC: 34.8 g/dL (ref 32.0–36.0)
MCV: 86.9 fL (ref 79.3–98.0)
MONO#: 0.4 10*3/uL (ref 0.1–0.9)
MONO%: 9.3 % (ref 0.0–14.0)
NEUT#: 2.4 10*3/uL (ref 1.5–6.5)
NEUT%: 56.7 % (ref 39.0–75.0)
Platelets: 153 10*3/uL (ref 140–400)
RBC: 4.3 10*6/uL (ref 4.20–5.82)
RDW: 15.7 % — ABNORMAL HIGH (ref 11.0–14.6)
WBC: 4.3 10*3/uL (ref 4.0–10.3)
lymph#: 1.4 10*3/uL (ref 0.9–3.3)

## 2012-08-09 LAB — COMPREHENSIVE METABOLIC PANEL (CC13)
ALT: 59 U/L — ABNORMAL HIGH (ref 0–55)
AST: 38 U/L — ABNORMAL HIGH (ref 5–34)
Albumin: 3.5 g/dL (ref 3.5–5.0)
Alkaline Phosphatase: 74 U/L (ref 40–150)
BUN: 9 mg/dL (ref 7.0–26.0)
CO2: 25 mEq/L (ref 22–29)
Calcium: 9.3 mg/dL (ref 8.4–10.4)
Chloride: 105 mEq/L (ref 98–107)
Creatinine: 1.1 mg/dL (ref 0.7–1.3)
Glucose: 178 mg/dl — ABNORMAL HIGH (ref 70–99)
Potassium: 3.9 mEq/L (ref 3.5–5.1)
Sodium: 138 mEq/L (ref 136–145)
Total Bilirubin: 0.39 mg/dL (ref 0.20–1.20)
Total Protein: 8.8 g/dL — ABNORMAL HIGH (ref 6.4–8.3)

## 2012-08-09 NOTE — Telephone Encounter (Signed)
gv and printed appt schedule for pt for Sept 2014....the patient aware

## 2012-08-09 NOTE — Progress Notes (Signed)
Hematology and Oncology Follow Up Visit  James [name] [id number] 1945/09/03 67 y.o. 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 He has elevated IgG and IgA  Interim History:  James [name] returns today for a follow up visit. His GI bleeding has improved since the last visit. He is not reporting any abdominal pain or rectal bleeding. No recent hospitalizations or illness. He reports no pathological fractures, no recent infections. He still has some blood in the stool.  He did not have any infections or hospitalizations. He reports more energy that he had before and thinking about going back to work.   Medications: I have reviewed the patient's current medications. Current outpatient prescriptions:amLODipine (NORVASC) 10 MG tablet, Take 10 mg by mouth every morning. , Disp: , Rfl: ;  azelastine (ASTEPRO) 137 MCG/SPRAY nasal spray, Place 1 spray into the nose 2 (two) times daily as needed. For dry nasal passages, Disp: , Rfl: ;  ferrous sulfate 325 (65 FE) MG tablet, Take 325 mg by mouth 2 (two) times daily with a meal. , Disp: , Rfl:  gabapentin (NEURONTIN) 300 MG capsule, Take 300 mg by mouth 3 (three) times daily. , Disp: , Rfl: ;  glimepiride (AMARYL) 2 MG tablet, Take 2 mg by mouth daily before breakfast. , Disp: , Rfl: ;  HYDROcodone-acetaminophen (NORCO) 5-325 MG per tablet, Take 1 tablet by mouth every 6 (six) hours as needed. Pain, Disp: , Rfl: ;  losartan (COZAAR) 50 MG tablet, Take 50 mg by mouth every morning. , Disp: , Rfl:  metFORMIN (GLUCOPHAGE-XR) 500 MG 24 hr tablet, Take 1,000 mg by mouth 2 (two) times daily. , Disp: , Rfl: ;  omeprazole (PRILOSEC) 20 MG capsule, Take 20 mg by mouth 4 (four) times daily. , Disp: , Rfl: ;  Tamsulosin HCl (FLOMAX) 0.4 MG CAPS, Take 0.4 mg by mouth daily after supper., Disp: , Rfl: ;  traMADol (ULTRAM) 50 MG tablet, Take 50 mg by mouth 4  (four) times daily -  with meals and at bedtime. For pain, Disp: , Rfl:  [DISCONTINUED] terazosin (HYTRIN) 5 MG capsule, Take 5 mg by mouth 2 (two) times daily. , Disp: , Rfl:   Allergies: No Known Allergies  Past Medical History, Surgical history, Social history, and Family History were reviewed and updated.  Review of Systems: Constitutional:  Negative for fever, chills, night sweats, anorexia, weight loss, pain. Cardiovascular: no chest pain or dyspnea on exertion Respiratory: no cough, shortness of breath, or wheezing Neurological: no TIA or stroke symptoms Dermatological: negative ENT: negative Skin: Negative. Gastrointestinal: no abdominal pain, change in bowel habits, or black or bloody stools Genito-Urinary: no dysuria, trouble voiding, or hematuria Hematological and Lymphatic: negative Breast: negative Musculoskeletal: negative Remaining ROS negative. Physical Exam: Blood pressure 152/82, pulse 89, temperature 97.1 F (36.2 C), temperature source Oral, resp. rate 20, height 6' (1.829 m), weight 207 lb 11.2 oz (94.212 kg). ECOG: 1 General appearance: alert Head: Normocephalic, without obvious abnormality, atraumatic Neck: no adenopathy, no carotid bruit, no JVD, supple, symmetrical, trachea midline and thyroid not enlarged, symmetric, no tenderness/mass/nodules Lymph nodes: Cervical, supraclavicular, and axillary nodes normal. Heart:regular rate and rhythm, S1, S2 normal, no murmur, click, rub or gallop Lung:chest clear, no wheezing, rales, normal symmetric air entry, no tachypnea, retractions or cyanosis Abdomin: soft, non-tender, without masses or organomegaly EXT:no erythema, induration, or nodules   Lab Results: Lab Results  Component Value Date   WBC 4.3 08/09/2012   HGB 13.0 08/09/2012   HCT 37.4* 08/09/2012   MCV 86.9 08/09/2012   PLT 153 08/09/2012     Chemistry      Component Value Date/Time   NA 138 03/02/2012 1220   K 3.9 03/02/2012 1220   CL 104 03/02/2012  1220   CO2 23 03/02/2012 1220   BUN 10 03/02/2012 1220   CREATININE 0.85 03/02/2012 1220      Component Value Date/Time   CALCIUM 9.0 03/02/2012 1220   ALKPHOS 58 02/09/2012 0930   AST 37 02/09/2012 0930   ALT 46 02/09/2012 0930   BILITOT 0.3 02/09/2012 0930     Results for James [name], James [name] (MRN [id number]) as of 08/09/2012 09:13  Ref. Range 07/14/2011 11:02 02/09/2012 09:30  M-SPIKE, % No range found 0.43 0.46  Results for James [name], James [name] (MRN [id number]) as of 08/09/2012 09:13  Ref. Range 07/14/2011 11:02 02/09/2012 09:30  IgG (Immunoglobin G), Serum Latest Range: [phone] mg/dL 6295 (H) 2841 (H)  IgA Latest Range: 68-379 mg/dL 324 (H) 401 (H)    Impression and Plan:  67 year old with:  1. IgG and IgA Kappa monoclonal gammopathy. This less likely represent a true plasma cell disorder. No end organ damage to suggest myeloma. This could represent MGUS however. No further work up is needed now. I will repeat these studies in 9 months. He does appear to have more a polyclonal increase.   2.   GI bleeding. It seems to have improved now.   3.  Anemia: related to #2. Hgb is back to normal now.    [facility], MD 1/10/20149:44 AM

## 2012-08-09 NOTE — Progress Notes (Signed)
Revised medication list and AVS mailed to patient's home.

## 2012-08-13 LAB — SPEP & IFE WITH QIG
Albumin ELP: 47.1 % — ABNORMAL LOW (ref 55.8–66.1)
Alpha-1-Globulin: 3.2 % (ref 2.9–4.9)
Alpha-2-Globulin: 9.6 % (ref 7.1–11.8)
Beta 2: 11.3 % — ABNORMAL HIGH (ref 3.2–6.5)
Beta Globulin: 5.9 % (ref 4.7–7.2)
Gamma Globulin: 22.9 % — ABNORMAL HIGH (ref 11.1–18.8)
IgA: 753 mg/dL — ABNORMAL HIGH (ref 68–379)
IgG (Immunoglobin G), Serum: 1870 mg/dL — ABNORMAL HIGH (ref 650–1600)
IgM, Serum: 80 mg/dL (ref 41–251)
M-Spike, %: 0.72 g/dL
Total Protein, Serum Electrophoresis: 8.5 g/dL — ABNORMAL HIGH (ref 6.0–8.3)

## 2012-08-13 LAB — KAPPA/LAMBDA LIGHT CHAINS
Kappa free light chain: 4.67 mg/dL — ABNORMAL HIGH (ref 0.33–1.94)
Kappa:Lambda Ratio: 1.46 (ref 0.26–1.65)
Lambda Free Lght Chn: 3.19 mg/dL — ABNORMAL HIGH (ref 0.57–2.63)

## 2012-08-20 ENCOUNTER — Other Ambulatory Visit: Payer: Self-pay | Admitting: Pain Medicine

## 2012-08-20 DIAGNOSIS — R52 Pain, unspecified: Secondary | ICD-10-CM

## 2012-08-23 ENCOUNTER — Ambulatory Visit
Admission: RE | Admit: 2012-08-23 | Discharge: 2012-08-23 | Disposition: A | Discharge status: Home / Self Care | Payer: Medicare Other | Source: Ambulatory Visit | Attending: Pain Medicine | Admitting: Pain Medicine

## 2012-08-23 DIAGNOSIS — R52 Pain, unspecified: Secondary | ICD-10-CM

## 2012-08-23 DIAGNOSIS — M545 Low back pain, unspecified: Secondary | ICD-10-CM

## 2012-09-27 ENCOUNTER — Encounter (HOSPITAL_COMMUNITY): Payer: Self-pay | Admitting: Emergency Medicine

## 2012-09-27 ENCOUNTER — Emergency Department (HOSPITAL_COMMUNITY): Payer: No Typology Code available for payment source

## 2012-09-27 ENCOUNTER — Emergency Department (HOSPITAL_COMMUNITY)
Admission: EM | Admit: 2012-09-27 | Discharge: 2012-09-27 | Disposition: A | Discharge status: Home / Self Care | Payer: No Typology Code available for payment source | Attending: Emergency Medicine | Admitting: Emergency Medicine

## 2012-09-27 DIAGNOSIS — G8929 Other chronic pain: Secondary | ICD-10-CM | POA: Insufficient documentation

## 2012-09-27 DIAGNOSIS — D649 Anemia, unspecified: Secondary | ICD-10-CM | POA: Insufficient documentation

## 2012-09-27 DIAGNOSIS — Z87891 Personal history of nicotine dependence: Secondary | ICD-10-CM | POA: Insufficient documentation

## 2012-09-27 DIAGNOSIS — Z79899 Other long term (current) drug therapy: Secondary | ICD-10-CM | POA: Insufficient documentation

## 2012-09-27 DIAGNOSIS — Z8679 Personal history of other diseases of the circulatory system: Secondary | ICD-10-CM | POA: Insufficient documentation

## 2012-09-27 DIAGNOSIS — M129 Arthropathy, unspecified: Secondary | ICD-10-CM | POA: Insufficient documentation

## 2012-09-27 DIAGNOSIS — E079 Disorder of thyroid, unspecified: Secondary | ICD-10-CM | POA: Insufficient documentation

## 2012-09-27 DIAGNOSIS — M545 Low back pain, unspecified: Secondary | ICD-10-CM

## 2012-09-27 DIAGNOSIS — Z8619 Personal history of other infectious and parasitic diseases: Secondary | ICD-10-CM | POA: Insufficient documentation

## 2012-09-27 DIAGNOSIS — E119 Type 2 diabetes mellitus without complications: Secondary | ICD-10-CM | POA: Insufficient documentation

## 2012-09-27 DIAGNOSIS — M25512 Pain in left shoulder: Secondary | ICD-10-CM

## 2012-09-27 DIAGNOSIS — Z8701 Personal history of pneumonia (recurrent): Secondary | ICD-10-CM | POA: Insufficient documentation

## 2012-09-27 DIAGNOSIS — N400 Enlarged prostate without lower urinary tract symptoms: Secondary | ICD-10-CM | POA: Insufficient documentation

## 2012-09-27 DIAGNOSIS — I10 Essential (primary) hypertension: Secondary | ICD-10-CM | POA: Insufficient documentation

## 2012-09-27 DIAGNOSIS — Y9389 Activity, other specified: Secondary | ICD-10-CM | POA: Insufficient documentation

## 2012-09-27 DIAGNOSIS — K219 Gastro-esophageal reflux disease without esophagitis: Secondary | ICD-10-CM | POA: Insufficient documentation

## 2012-09-27 DIAGNOSIS — S139XXA Sprain of joints and ligaments of unspecified parts of neck, initial encounter: Secondary | ICD-10-CM | POA: Insufficient documentation

## 2012-09-27 DIAGNOSIS — Y92410 Unspecified street and highway as the place of occurrence of the external cause: Secondary | ICD-10-CM | POA: Insufficient documentation

## 2012-09-27 DIAGNOSIS — E785 Hyperlipidemia, unspecified: Secondary | ICD-10-CM | POA: Insufficient documentation

## 2012-09-27 DIAGNOSIS — S161XXA Strain of muscle, fascia and tendon at neck level, initial encounter: Secondary | ICD-10-CM

## 2012-09-27 MED ORDER — IBUPROFEN 600 MG PO TABS: 600.0000 mg | ORAL_TABLET | Freq: Four times a day (QID) | ORAL | Status: DC | PRN

## 2012-09-27 MED ORDER — OXYCODONE-ACETAMINOPHEN 5-325 MG PO TABS: 1.0000 | ORAL_TABLET | Freq: Four times a day (QID) | ORAL | Status: DC | PRN

## 2012-09-27 MED ORDER — OXYCODONE-ACETAMINOPHEN 5-325 MG PO TABS
1.0000 | ORAL_TABLET | Freq: Once | ORAL | Status: AC
  Administered 2012-09-27: 1 via ORAL
  Filled 2012-09-27: qty 1

## 2012-09-27 NOTE — ED Notes (Signed)
Patient transported to X-ray 
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
## 2013-04-09 ENCOUNTER — Other Ambulatory Visit: Payer: Medicare Other

## 2013-04-09 ENCOUNTER — Ambulatory Visit: Payer: Medicare Other | Admitting: Oncology

## 2013-04-11 ENCOUNTER — Other Ambulatory Visit: Payer: Medicare Other | Admitting: Lab

## 2013-04-11 ENCOUNTER — Ambulatory Visit: Payer: Medicare Other | Admitting: Oncology

## 2013-05-14 ENCOUNTER — Telehealth: Payer: Self-pay | Admitting: Oncology

## 2013-05-14 NOTE — Telephone Encounter (Signed)
pt called to r/s missed appt...done... °

## 2013-05-21 ENCOUNTER — Encounter (INDEPENDENT_AMBULATORY_CARE_PROVIDER_SITE_OTHER): Payer: Self-pay

## 2013-05-21 ENCOUNTER — Ambulatory Visit (HOSPITAL_BASED_OUTPATIENT_CLINIC_OR_DEPARTMENT_OTHER): Payer: Medicare Other | Admitting: Oncology

## 2013-05-21 ENCOUNTER — Other Ambulatory Visit (HOSPITAL_BASED_OUTPATIENT_CLINIC_OR_DEPARTMENT_OTHER): Payer: Medicare Other | Admitting: Lab

## 2013-05-21 VITALS — BP 155/86 | HR 76 | Temp 97.8°F | Resp 20 | Ht 72.0 in | Wt 211.8 lb

## 2013-05-21 DIAGNOSIS — D472 Monoclonal gammopathy: Secondary | ICD-10-CM

## 2013-05-21 DIAGNOSIS — D729 Disorder of white blood cells, unspecified: Secondary | ICD-10-CM

## 2013-05-21 LAB — CBC WITH DIFFERENTIAL/PLATELET
BASO%: 0.2 % (ref 0.0–2.0)
Basophils Absolute: 0 10*3/uL (ref 0.0–0.1)
EOS%: 4 % (ref 0.0–7.0)
Eosinophils Absolute: 0.2 10*3/uL (ref 0.0–0.5)
HCT: 39.8 % (ref 38.4–49.9)
HGB: 13 g/dL (ref 13.0–17.1)
LYMPH%: 36.8 % (ref 14.0–49.0)
MCH: 29.6 pg (ref 27.2–33.4)
MCHC: 32.7 g/dL (ref 32.0–36.0)
MCV: 90.7 fL (ref 79.3–98.0)
MONO#: 0.4 10*3/uL (ref 0.1–0.9)
MONO%: 8 % (ref 0.0–14.0)
NEUT#: 2.4 10*3/uL (ref 1.5–6.5)
NEUT%: 51 % (ref 39.0–75.0)
Platelets: 197 10*3/uL (ref 140–400)
RBC: 4.39 10*6/uL (ref 4.20–5.82)
RDW: 12.1 % (ref 11.0–14.6)
WBC: 4.8 10*3/uL (ref 4.0–10.3)
lymph#: 1.8 10*3/uL (ref 0.9–3.3)

## 2013-05-21 LAB — COMPREHENSIVE METABOLIC PANEL (CC13)
ALT: 11 U/L (ref 0–55)
AST: 16 U/L (ref 5–34)
Albumin: 3.7 g/dL (ref 3.5–5.0)
Alkaline Phosphatase: 62 U/L (ref 40–150)
Anion Gap: 11 mEq/L (ref 3–11)
BUN: 10.5 mg/dL (ref 7.0–26.0)
CO2: 20 mEq/L — ABNORMAL LOW (ref 22–29)
Calcium: 9.5 mg/dL (ref 8.4–10.4)
Chloride: 107 mEq/L (ref 98–109)
Creatinine: 1 mg/dL (ref 0.7–1.3)
Glucose: 119 mg/dl (ref 70–140)
Potassium: 3.7 mEq/L (ref 3.5–5.1)
Sodium: 138 mEq/L (ref 136–145)
Total Bilirubin: <0.2 mg/dL (ref 0.20–1.20)
Total Protein: 8.5 g/dL — ABNORMAL HIGH (ref 6.4–8.3)

## 2013-05-21 NOTE — Addendum Note (Signed)
Addended by: Reesa Chew on: 05/21/2013 10:36 AM   Modules accepted: Orders

## 2013-05-21 NOTE — Progress Notes (Signed)
Hematology and Oncology Follow Up Visit  James [name] [id number] 31-Jul-1946 67 y.o. 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 He has elevated IgG and IgA  Interim History:  [name] returns today for a follow up visit. His GI bleeding has resolved since his last visit. He has been treated for hepatitis C. and following up at [facility] He is not reporting any abdominal pain or rectal bleeding. No recent hospitalizations or illness. He reports no pathological fractures, no recent infections. He still has some blood in the stool.  He did not have any infections or hospitalizations. He reports more energy that he had before and thinking about going back to work.   Medications: I have reviewed the patient's current medications.  Current Outpatient Prescriptions  Medication Sig Dispense Refill  . amLODipine (NORVASC) 10 MG tablet Take 10 mg by mouth every morning.       Marland Kitchen azelastine (ASTEPRO) 137 MCG/SPRAY nasal spray Place 1 spray into the nose 2 (two) times daily as needed. For dry nasal passages      . ferrous sulfate 325 (65 FE) MG tablet Take 325 mg by mouth 2 (two) times daily with a meal.       . gabapentin (NEURONTIN) 300 MG capsule Take 300 mg by mouth 3 (three) times daily.       Marland Kitchen glimepiride (AMARYL) 2 MG tablet Take 2 mg by mouth daily before breakfast.       . HYDROcodone-acetaminophen (NORCO) 10-325 MG per tablet Take 1 tablet by mouth every 6 (six) hours as needed (PAIN).       Marland Kitchen ibuprofen (ADVIL,MOTRIN) 600 MG tablet Take 1 tablet (600 mg total) by mouth every 6 (six) hours as needed for pain.  30 tablet  0  . losartan (COZAAR) 50 MG tablet Take 50 mg by mouth every morning.       . metFORMIN (GLUCOPHAGE-XR) 500 MG 24 hr tablet Take 1,000 mg by mouth 2 (two) times daily.       Marland Kitchen omeprazole (PRILOSEC) 20 MG capsule Take 20 mg by  mouth 4 (four) times daily.       Marland Kitchen oxyCODONE-acetaminophen (PERCOCET/ROXICET) 5-325 MG per tablet Take 1-2 tablets by mouth every 6 (six) hours as needed for pain.  15 tablet  0  . Tamsulosin HCl (FLOMAX) 0.4 MG CAPS Take 0.4 mg by mouth 2 (two) times daily.       . traMADol (ULTRAM) 50 MG tablet Take 50 mg by mouth 4 (four) times daily -  with meals and at bedtime. For pain      . [DISCONTINUED] terazosin (HYTRIN) 5 MG capsule Take 5 mg by mouth 2 (two) times daily.        No current facility-administered medications for this visit.    Allergies: No Known Allergies  Past Medical History, Surgical history, Social history, and Family History were reviewed and updated.  Review of Systems:  Remaining ROS negative. Physical Exam: Blood pressure 155/86, pulse 76, temperature 97.8 F (36.6 C), temperature source Oral, resp. rate 20, height 6' (1.829 m), weight 211 lb 12.8 oz (96.072 kg). ECOG: 1 General appearance: alert Head: Normocephalic, without obvious abnormality, atraumatic Neck: no adenopathy, no carotid bruit, no JVD, supple, symmetrical, trachea midline and thyroid not enlarged, symmetric, no tenderness/mass/nodules Lymph nodes: Cervical, supraclavicular, and axillary nodes normal. Heart:regular rate and rhythm, S1, S2 normal,  no murmur, click, rub or gallop Lung:chest clear, no wheezing, rales, normal symmetric air entry, no tachypnea, retractions or cyanosis Abdomin: soft, non-tender, without masses or organomegaly EXT:no erythema, induration, or nodules   Lab Results: Lab Results  Component Value Date   WBC 4.8 05/21/2013   HGB 13.0 05/21/2013   HCT 39.8 05/21/2013   MCV 90.7 05/21/2013   PLT 197 05/21/2013     Chemistry      Component Value Date/Time   NA 138 08/09/2012 0918   NA 138 03/02/2012 1220   K 3.9 08/09/2012 0918   K 3.9 03/02/2012 1220   CL 105 08/09/2012 0918   CL 104 03/02/2012 1220   CO2 25 08/09/2012 0918   CO2 23 03/02/2012 1220   BUN 9.0 08/09/2012 0918    BUN 10 03/02/2012 1220   CREATININE 1.1 08/09/2012 0918   CREATININE 0.85 03/02/2012 1220      Component Value Date/Time   CALCIUM 9.3 08/09/2012 0918   CALCIUM 9.0 03/02/2012 1220   ALKPHOS 74 08/09/2012 0918   ALKPHOS 58 02/09/2012 0930   AST 38* 08/09/2012 0918   AST 37 02/09/2012 0930   ALT 59* 08/09/2012 0918   ALT 46 02/09/2012 0930   BILITOT 0.39 08/09/2012 0918   BILITOT 0.3 02/09/2012 0930      Results for ISAK, SOTOMAYOR (MRN [id number]) as of 05/21/2013 10:06  Ref. Range 07/14/2011 11:02 02/09/2012 09:30 08/09/2012 09:18  IgG (Immunoglobin G), Serum Latest Range: [phone] mg/dL 8413 (H) 2440 (H) 1027 (H)  IgA Latest Range: 68-379 mg/dL 253 (H) 664 (H) 403 (H)    Impression and Plan:  67 year old with:  1. IgG and IgA Kappa monoclonal gammopathy. This less likely represent a true plasma cell disorder. This could be related to his liver disease. No end organ damage to suggest myeloma.He does appear to have more a polyclonal increase.  I will repeat his blood work in about 12 months and if it's continue to be consistent with polyclonal gammopathy then no further followup will be needed.  2.   GI bleeding. It seems to have improved now.   3.  Anemia: related to #2. Hgb is back to normal now.    SHADAD,FIRAS, MD 10/22/201410:14 AM

## 2013-05-22 ENCOUNTER — Telehealth: Payer: Self-pay | Admitting: Oncology

## 2013-05-22 NOTE — Telephone Encounter (Signed)
s.w. pt and advised on OCT 2015 appt....mailed pt letter adn avs

## 2013-05-23 LAB — KAPPA/LAMBDA LIGHT CHAINS
Kappa free light chain: 2.35 mg/dL — ABNORMAL HIGH (ref 0.33–1.94)
Kappa:Lambda Ratio: 1.01 (ref 0.26–1.65)
Lambda Free Lght Chn: 2.32 mg/dL (ref 0.57–2.63)

## 2013-05-26 LAB — SPEP & IFE WITH QIG

## 2013-10-03 DIAGNOSIS — J0100 Acute maxillary sinusitis, unspecified: Secondary | ICD-10-CM | POA: Diagnosis not present

## 2013-12-03 DIAGNOSIS — H524 Presbyopia: Secondary | ICD-10-CM | POA: Diagnosis not present

## 2013-12-03 DIAGNOSIS — H2510 Age-related nuclear cataract, unspecified eye: Secondary | ICD-10-CM | POA: Diagnosis not present

## 2013-12-03 DIAGNOSIS — H547 Unspecified visual loss: Secondary | ICD-10-CM | POA: Diagnosis not present

## 2013-12-31 DIAGNOSIS — J019 Acute sinusitis, unspecified: Secondary | ICD-10-CM | POA: Diagnosis not present

## 2014-04-16 DIAGNOSIS — J0110 Acute frontal sinusitis, unspecified: Secondary | ICD-10-CM | POA: Diagnosis not present

## 2014-05-06 ENCOUNTER — Other Ambulatory Visit: Payer: Self-pay | Admitting: Orthopedic Surgery

## 2014-05-12 ENCOUNTER — Encounter (HOSPITAL_BASED_OUTPATIENT_CLINIC_OR_DEPARTMENT_OTHER): Payer: Self-pay | Admitting: *Deleted

## 2014-05-12 NOTE — Progress Notes (Signed)
05/12/14 1112  OBSTRUCTIVE SLEEP APNEA  Have you ever been diagnosed with sleep apnea through a sleep study? No  Do you snore loudly (loud enough to be heard through closed doors)?  0  Do you often feel tired, fatigued, or sleepy during the daytime? 0  Has anyone observed you stop breathing during your sleep? 0  Do you have, or are you being treated for high blood pressure? 1  BMI more than 35 kg/m2? 0  Age over 70 years old? 1  Neck circumference greater than 40 cm/16 inches? 1  Gender: 1  Obstructive Sleep Apnea Score 4  Score 4 or greater  Results sent to PCP

## 2014-05-12 NOTE — Progress Notes (Signed)
To come in for bmet-ekg only had Hg a1c done with pcp

## 2014-05-13 ENCOUNTER — Encounter (HOSPITAL_BASED_OUTPATIENT_CLINIC_OR_DEPARTMENT_OTHER)
Admission: RE | Admit: 2014-05-13 | Discharge: 2014-05-13 | Disposition: A | Discharge status: Home / Self Care | Payer: Medicare Other | Source: Ambulatory Visit | Attending: Orthopedic Surgery | Admitting: Orthopedic Surgery

## 2014-05-13 DIAGNOSIS — R350 Frequency of micturition: Secondary | ICD-10-CM | POA: Insufficient documentation

## 2014-05-13 DIAGNOSIS — E785 Hyperlipidemia, unspecified: Secondary | ICD-10-CM | POA: Insufficient documentation

## 2014-05-13 DIAGNOSIS — D649 Anemia, unspecified: Secondary | ICD-10-CM | POA: Insufficient documentation

## 2014-05-13 DIAGNOSIS — K219 Gastro-esophageal reflux disease without esophagitis: Secondary | ICD-10-CM | POA: Diagnosis not present

## 2014-05-13 DIAGNOSIS — G43909 Migraine, unspecified, not intractable, without status migrainosus: Secondary | ICD-10-CM | POA: Diagnosis not present

## 2014-05-13 DIAGNOSIS — I10 Essential (primary) hypertension: Secondary | ICD-10-CM | POA: Insufficient documentation

## 2014-05-13 DIAGNOSIS — E049 Nontoxic goiter, unspecified: Secondary | ICD-10-CM | POA: Insufficient documentation

## 2014-05-13 DIAGNOSIS — M1990 Unspecified osteoarthritis, unspecified site: Secondary | ICD-10-CM | POA: Diagnosis not present

## 2014-05-13 DIAGNOSIS — E119 Type 2 diabetes mellitus without complications: Secondary | ICD-10-CM | POA: Insufficient documentation

## 2014-05-13 DIAGNOSIS — M75102 Unspecified rotator cuff tear or rupture of left shoulder, not specified as traumatic: Secondary | ICD-10-CM | POA: Diagnosis not present

## 2014-05-13 DIAGNOSIS — Z0000 Encounter for general adult medical examination without abnormal findings: Secondary | ICD-10-CM | POA: Insufficient documentation

## 2014-05-13 DIAGNOSIS — N400 Enlarged prostate without lower urinary tract symptoms: Secondary | ICD-10-CM | POA: Insufficient documentation

## 2014-05-13 DIAGNOSIS — Z8619 Personal history of other infectious and parasitic diseases: Secondary | ICD-10-CM | POA: Diagnosis not present

## 2014-05-13 LAB — BASIC METABOLIC PANEL
Anion gap: 14 (ref 5–15)
BUN: 12 mg/dL (ref 6–23)
CO2: 23 mEq/L (ref 19–32)
Calcium: 9.3 mg/dL (ref 8.4–10.5)
Chloride: 100 mEq/L (ref 96–112)
Creatinine, Ser: 1.06 mg/dL (ref 0.50–1.35)
GFR calc Af Amer: 80 mL/min — ABNORMAL LOW (ref >90)
GFR calc non Af Amer: 69 mL/min — ABNORMAL LOW (ref >90)
Glucose, Bld: 248 mg/dL — ABNORMAL HIGH (ref 70–99)
Potassium: 4.3 mEq/L (ref 3.7–5.3)
Sodium: 137 mEq/L (ref 137–147)

## 2014-05-18 ENCOUNTER — Ambulatory Visit (HOSPITAL_BASED_OUTPATIENT_CLINIC_OR_DEPARTMENT_OTHER)
Admission: RE | Admit: 2014-05-18 | Discharge: 2014-05-18 | Disposition: A | Discharge status: Home / Self Care | Payer: Medicare Other | Source: Ambulatory Visit | Attending: Orthopedic Surgery | Admitting: Orthopedic Surgery

## 2014-05-18 ENCOUNTER — Encounter (HOSPITAL_BASED_OUTPATIENT_CLINIC_OR_DEPARTMENT_OTHER): Payer: Medicare Other | Admitting: Certified Registered"

## 2014-05-18 ENCOUNTER — Encounter (HOSPITAL_BASED_OUTPATIENT_CLINIC_OR_DEPARTMENT_OTHER): Payer: Self-pay | Admitting: Certified Registered"

## 2014-05-18 ENCOUNTER — Ambulatory Visit (HOSPITAL_BASED_OUTPATIENT_CLINIC_OR_DEPARTMENT_OTHER): Payer: Medicare Other | Admitting: Certified Registered"

## 2014-05-18 ENCOUNTER — Encounter (HOSPITAL_BASED_OUTPATIENT_CLINIC_OR_DEPARTMENT_OTHER)
Admission: RE | Disposition: A | Discharge status: Home / Self Care | Payer: Self-pay | Source: Ambulatory Visit | Attending: Orthopedic Surgery

## 2014-05-18 DIAGNOSIS — Z8619 Personal history of other infectious and parasitic diseases: Secondary | ICD-10-CM | POA: Diagnosis not present

## 2014-05-18 DIAGNOSIS — E049 Nontoxic goiter, unspecified: Secondary | ICD-10-CM | POA: Insufficient documentation

## 2014-05-18 DIAGNOSIS — N400 Enlarged prostate without lower urinary tract symptoms: Secondary | ICD-10-CM | POA: Insufficient documentation

## 2014-05-18 DIAGNOSIS — K219 Gastro-esophageal reflux disease without esophagitis: Secondary | ICD-10-CM | POA: Diagnosis not present

## 2014-05-18 DIAGNOSIS — Z79899 Other long term (current) drug therapy: Secondary | ICD-10-CM | POA: Diagnosis not present

## 2014-05-18 DIAGNOSIS — D649 Anemia, unspecified: Secondary | ICD-10-CM | POA: Insufficient documentation

## 2014-05-18 DIAGNOSIS — M75102 Unspecified rotator cuff tear or rupture of left shoulder, not specified as traumatic: Secondary | ICD-10-CM

## 2014-05-18 DIAGNOSIS — Z87891 Personal history of nicotine dependence: Secondary | ICD-10-CM | POA: Insufficient documentation

## 2014-05-18 DIAGNOSIS — I10 Essential (primary) hypertension: Secondary | ICD-10-CM | POA: Insufficient documentation

## 2014-05-18 DIAGNOSIS — E118 Type 2 diabetes mellitus with unspecified complications: Secondary | ICD-10-CM | POA: Diagnosis not present

## 2014-05-18 DIAGNOSIS — M75122 Complete rotator cuff tear or rupture of left shoulder, not specified as traumatic: Secondary | ICD-10-CM | POA: Diagnosis present

## 2014-05-18 DIAGNOSIS — E785 Hyperlipidemia, unspecified: Secondary | ICD-10-CM | POA: Insufficient documentation

## 2014-05-18 DIAGNOSIS — E119 Type 2 diabetes mellitus without complications: Secondary | ICD-10-CM | POA: Diagnosis not present

## 2014-05-18 HISTORY — PX: SHOULDER ARTHROSCOPY WITH ROTATOR CUFF REPAIR: SHX5685

## 2014-05-18 HISTORY — DX: Frequency of micturition: R35.0

## 2014-05-18 LAB — GLUCOSE, CAPILLARY
Glucose-Capillary: 156 mg/dL — ABNORMAL HIGH (ref 70–99)
Glucose-Capillary: 151 mg/dL — ABNORMAL HIGH (ref 70–99)

## 2014-05-18 LAB — POCT HEMOGLOBIN-HEMACUE: Hemoglobin: 13.1 g/dL (ref 13.0–17.0)

## 2014-05-18 SURGERY — ARTHROSCOPY, SHOULDER, WITH ROTATOR CUFF REPAIR
Anesthesia: Regional | Site: Shoulder | Laterality: Left

## 2014-05-18 MED ORDER — SODIUM CHLORIDE 0.9 % IR SOLN
Status: DC | PRN
  Administered 2014-05-18: 6000 mL

## 2014-05-18 MED ORDER — LIDOCAINE HCL (CARDIAC) 20 MG/ML IV SOLN
INTRAVENOUS | Status: DC | PRN
  Administered 2014-05-18: 50 mg via INTRAVENOUS

## 2014-05-18 MED ORDER — ONDANSETRON HCL 4 MG/2ML IJ SOLN
INTRAMUSCULAR | Status: DC | PRN
  Administered 2014-05-18: 4 mg via INTRAVENOUS

## 2014-05-18 MED ORDER — FENTANYL CITRATE 0.05 MG/ML IJ SOLN
50.0000 ug | INTRAMUSCULAR | Status: DC | PRN
  Administered 2014-05-18: 100 ug via INTRAVENOUS

## 2014-05-18 MED ORDER — POVIDONE-IODINE 7.5 % EX SOLN: Freq: Once | CUTANEOUS | Status: DC

## 2014-05-18 MED ORDER — ROPIVACAINE HCL 5 MG/ML IJ SOLN
INTRAMUSCULAR | Status: DC | PRN
  Administered 2014-05-18: 30 mL via PERINEURAL

## 2014-05-18 MED ORDER — MIDAZOLAM HCL 2 MG/2ML IJ SOLN
INTRAMUSCULAR | Status: AC
  Filled 2014-05-18: qty 2

## 2014-05-18 MED ORDER — DEXAMETHASONE SODIUM PHOSPHATE 4 MG/ML IJ SOLN
INTRAMUSCULAR | Status: DC | PRN
  Administered 2014-05-18: 10 mg via INTRAVENOUS

## 2014-05-18 MED ORDER — FENTANYL CITRATE 0.05 MG/ML IJ SOLN
INTRAMUSCULAR | Status: AC
  Filled 2014-05-18: qty 6

## 2014-05-18 MED ORDER — EPHEDRINE SULFATE 50 MG/ML IJ SOLN
INTRAMUSCULAR | Status: DC | PRN
  Administered 2014-05-18: 15 mg via INTRAVENOUS

## 2014-05-18 MED ORDER — CEFAZOLIN SODIUM-DEXTROSE 2-3 GM-% IV SOLR
2.0000 g | INTRAVENOUS | Status: AC
  Administered 2014-05-18: 2 g via INTRAVENOUS

## 2014-05-18 MED ORDER — PHENYLEPHRINE HCL 10 MG/ML IJ SOLN
10.0000 mg | INTRAVENOUS | Status: DC | PRN
  Administered 2014-05-18: 50 ug/min via INTRAVENOUS

## 2014-05-18 MED ORDER — DOCUSATE SODIUM 100 MG PO CAPS: 100.0000 mg | ORAL_CAPSULE | Freq: Three times a day (TID) | ORAL | Status: DC | PRN

## 2014-05-18 MED ORDER — FENTANYL CITRATE 0.05 MG/ML IJ SOLN
INTRAMUSCULAR | Status: AC
  Filled 2014-05-18: qty 2

## 2014-05-18 MED ORDER — CEFAZOLIN SODIUM-DEXTROSE 2-3 GM-% IV SOLR
INTRAVENOUS | Status: AC
  Filled 2014-05-18: qty 50

## 2014-05-18 MED ORDER — LACTATED RINGERS IV SOLN
INTRAVENOUS | Status: DC
  Administered 2014-05-18 (×2): via INTRAVENOUS

## 2014-05-18 MED ORDER — SUCCINYLCHOLINE CHLORIDE 20 MG/ML IJ SOLN
INTRAMUSCULAR | Status: DC | PRN
  Administered 2014-05-18: 100 mg via INTRAVENOUS

## 2014-05-18 MED ORDER — PROPOFOL 10 MG/ML IV BOLUS
INTRAVENOUS | Status: DC | PRN
  Administered 2014-05-18: 150 mg via INTRAVENOUS

## 2014-05-18 MED ORDER — MIDAZOLAM HCL 2 MG/2ML IJ SOLN
1.0000 mg | INTRAMUSCULAR | Status: DC | PRN
  Administered 2014-05-18: 1 mg via INTRAVENOUS

## 2014-05-18 MED ORDER — OXYCODONE-ACETAMINOPHEN 5-325 MG PO TABS: 1.0000 | ORAL_TABLET | ORAL | Status: DC | PRN

## 2014-05-18 SURGICAL SUPPLY — 85 items
ADH SKN CLS APL DERMABOND .7 (GAUZE/BANDAGES/DRESSINGS)
ANCH SUT SWLK 19.1X4.75 VT (Anchor) ×1 IMPLANT
ANCHOR PEEK 4.75X19.1 SWLK C (Anchor) ×1 IMPLANT
APL SKNCLS STERI-STRIP NONHPOA (GAUZE/BANDAGES/DRESSINGS)
BENZOIN TINCTURE PRP APPL 2/3 (GAUZE/BANDAGES/DRESSINGS) IMPLANT
BLADE CLIPPER SURG (BLADE) IMPLANT
BLADE SURG 15 STRL LF DISP TIS (BLADE) IMPLANT
BLADE SURG 15 STRL SS (BLADE)
BUR OVAL 4.0 (BURR) ×2 IMPLANT
CANISTER SUCT 3000ML (MISCELLANEOUS) IMPLANT
CANNULA 5.75X71 LONG (CANNULA) ×2 IMPLANT
CANNULA TWIST IN 8.25X7CM (CANNULA) IMPLANT
CHLORAPREP W/TINT 26ML (MISCELLANEOUS) ×2 IMPLANT
DECANTER SPIKE VIAL GLASS SM (MISCELLANEOUS) IMPLANT
DERMABOND ADVANCED (GAUZE/BANDAGES/DRESSINGS)
DERMABOND ADVANCED .7 DNX12 (GAUZE/BANDAGES/DRESSINGS) IMPLANT
DRAPE INCISE IOBAN 66X45 STRL (DRAPES) ×2 IMPLANT
DRAPE STERI 35X30 U-POUCH (DRAPES) ×2 IMPLANT
DRAPE SURG 17X23 STRL (DRAPES) ×2 IMPLANT
DRAPE U 20/CS (DRAPES) ×2 IMPLANT
DRAPE U-SHAPE 47X51 STRL (DRAPES) ×2 IMPLANT
DRAPE U-SHAPE 76X120 STRL (DRAPES) ×4 IMPLANT
DRSG PAD ABDOMINAL 8X10 ST (GAUZE/BANDAGES/DRESSINGS) ×2 IMPLANT
ELECT REM PT RETURN 9FT ADLT (ELECTROSURGICAL) ×2
ELECTRODE REM PT RTRN 9FT ADLT (ELECTROSURGICAL) ×1 IMPLANT
GAUZE SPONGE 4X4 12PLY STRL (GAUZE/BANDAGES/DRESSINGS) ×2 IMPLANT
GAUZE SPONGE 4X4 16PLY XRAY LF (GAUZE/BANDAGES/DRESSINGS) IMPLANT
GAUZE XEROFORM 1X8 LF (GAUZE/BANDAGES/DRESSINGS) ×2 IMPLANT
GLOVE BIO SURGEON STRL SZ7 (GLOVE) ×2 IMPLANT
GLOVE BIO SURGEON STRL SZ7.5 (GLOVE) ×2 IMPLANT
GLOVE BIOGEL M STRL SZ7.5 (GLOVE) ×1 IMPLANT
GLOVE BIOGEL PI IND STRL 7.0 (GLOVE) ×1 IMPLANT
GLOVE BIOGEL PI IND STRL 8 (GLOVE) ×1 IMPLANT
GLOVE BIOGEL PI INDICATOR 7.0 (GLOVE) ×3
GLOVE BIOGEL PI INDICATOR 8 (GLOVE) ×2
GOWN STRL REUS W/ TWL LRG LVL3 (GOWN DISPOSABLE) ×2 IMPLANT
GOWN STRL REUS W/ TWL XL LVL3 (GOWN DISPOSABLE) IMPLANT
GOWN STRL REUS W/TWL LRG LVL3 (GOWN DISPOSABLE) ×4
GOWN STRL REUS W/TWL XL LVL3 (GOWN DISPOSABLE) ×4 IMPLANT
GOWN STRL REUS W/TWL XL LVL4 (GOWN DISPOSABLE) ×1 IMPLANT
MANIFOLD NEPTUNE II (INSTRUMENTS) ×2 IMPLANT
NDL 1/2 CIR CATGUT .05X1.09 (NEEDLE) IMPLANT
NDL SCORPION MULTI FIRE (NEEDLE) IMPLANT
NDL SUT 6 .5 CRC .975X.05 MAYO (NEEDLE) IMPLANT
NEEDLE 1/2 CIR CATGUT .05X1.09 (NEEDLE) IMPLANT
NEEDLE MAYO TAPER (NEEDLE)
NEEDLE SCORPION MULTI FIRE (NEEDLE) IMPLANT
NS IRRIG 1000ML POUR BTL (IV SOLUTION) IMPLANT
PACK ARTHROSCOPY DSU (CUSTOM PROCEDURE TRAY) ×2 IMPLANT
PACK BASIN DAY SURGERY FS (CUSTOM PROCEDURE TRAY) ×2 IMPLANT
PENCIL BUTTON HOLSTER BLD 10FT (ELECTRODE) IMPLANT
RESECTOR FULL RADIUS 4.2MM (BLADE) ×2 IMPLANT
SLEEVE SCD COMPRESS KNEE MED (MISCELLANEOUS) ×2 IMPLANT
SLING ARM IMMOBILIZER LRG (SOFTGOODS) ×1 IMPLANT
SLING ARM IMMOBILIZER MED (SOFTGOODS) IMPLANT
SLING ARM LRG ADULT FOAM STRAP (SOFTGOODS) IMPLANT
SLING ARM MED ADULT FOAM STRAP (SOFTGOODS) IMPLANT
SLING ARM XL FOAM STRAP (SOFTGOODS) IMPLANT
SPONGE LAP 4X18 X RAY DECT (DISPOSABLE) IMPLANT
STRIP CLOSURE SKIN 1/2X4 (GAUZE/BANDAGES/DRESSINGS) IMPLANT
SUCTION FRAZIER TIP 10 FR DISP (SUCTIONS) IMPLANT
SUPPORT WRAP ARM LG (MISCELLANEOUS) ×1 IMPLANT
SUT 2 FIBERLOOP 20 STRT BLUE (SUTURE)
SUT BONE WAX W31G (SUTURE) IMPLANT
SUT ETHILON 3 0 PS 1 (SUTURE) ×2 IMPLANT
SUT FIBERWIRE #2 38 T-5 BLUE (SUTURE)
SUT MNCRL AB 3-0 PS2 18 (SUTURE) IMPLANT
SUT MNCRL AB 4-0 PS2 18 (SUTURE) IMPLANT
SUT PDS AB 0 CT 36 (SUTURE) IMPLANT
SUT PROLENE 3 0 PS 2 (SUTURE) IMPLANT
SUT TIGER TAPE 7 IN WHITE (SUTURE) IMPLANT
SUT VIC AB 0 CT1 27 (SUTURE)
SUT VIC AB 0 CT1 27XBRD ANBCTR (SUTURE) IMPLANT
SUT VIC AB 2-0 SH 27 (SUTURE)
SUT VIC AB 2-0 SH 27XBRD (SUTURE) IMPLANT
SUTURE 2 FIBERLOOP 20 STRT BLU (SUTURE) IMPLANT
SUTURE FIBERWR #2 38 T-5 BLUE (SUTURE) IMPLANT
SYR BULB 3OZ (MISCELLANEOUS) IMPLANT
TOWEL OR 17X24 6PK STRL BLUE (TOWEL DISPOSABLE) ×2 IMPLANT
TOWEL OR NON WOVEN STRL DISP B (DISPOSABLE) ×2 IMPLANT
TUBE CONNECTING 20X1/4 (TUBING) ×2 IMPLANT
TUBING ARTHROSCOPY IRRIG 16FT (MISCELLANEOUS) ×2 IMPLANT
WAND STAR VAC 90 (SURGICAL WAND) ×2 IMPLANT
WATER STERILE IRR 1000ML POUR (IV SOLUTION) ×2 IMPLANT
YANKAUER SUCT BULB TIP NO VENT (SUCTIONS) IMPLANT

## 2014-05-18 NOTE — Transfer of Care (Signed)
2Immediate Anesthesia Transfer of Care Note  Patient: James [name]  Procedure(s) Performed: Procedure(s) with comments: SHOULDER ARTHROSCOPY WITH ROTATOR CUFF REPAIR (Left) - Left shoulder arthroscopy rotator cuff repair, subacromial decompression  Patient Location: PACU  Anesthesia Type:GA combined with regional for post-op pain  Level of Consciousness: awake, alert  and patient cooperative  Airway & Oxygen Therapy: Patient Spontanous Breathing and Patient connected to face mask oxygen  Post-op Assessment: Report given to PACU RN and Post -op Vital signs reviewed and stable  Post vital signs: Reviewed and stable  Complications: No apparent anesthesia complications

## 2014-05-18 NOTE — Progress Notes (Signed)
Assisted Dr. Edmond Fitzgerald with left, ultrasound guided, interscalene  block. Side rails up, monitors on throughout procedure. See vital signs in flow sheet. Tolerated Procedure well.  

## 2014-05-18 NOTE — H&P (Signed)
James [name] is an 68 y.o. male.   Chief Complaint: Left shoulder pain and weakness HPI: Left shoulder rotator cuff tear, failed conservative management with injection therapy physical therapy and activity modification  Past Medical History  Diagnosis Date  . Diabetes mellitus   . Hypertension   . Arthritis   . Thyroid disease     goiter  . HLD (hyperlipidemia)   . Anemia   . Pneumonia 07/2011  . GERD (gastroesophageal reflux disease)   . BPH (benign prostatic hyperplasia)   . Headache(784.0)     migraines, sinus headaches  . Hepatitis     C and B-treated  . Urinary frequency     Past Surgical History  Procedure Laterality Date  . Cholecystectomy    . Abdominal surgery    . Hernia repair    . Colonoscopy    . Back surgery      x 5   . Tonsillectomy    . Hand surgery      right finger  . Sinus surgery    . Rotator cuff repair      bilateral  . Cyst removal skull  20 years ago  . Eye surgery      bilat cataract with lens implants  . Ethmoidectomy  10/12/2011    Procedure: ETHMOIDECTOMY;  Surgeon: Thornell Sartorius, MD;  Location: [facility] OR;  Service: ENT;  Laterality: Bilateral;  bilateral maxillary sinus osteal enlargement, frontal sinusotomy    Family History  Problem Relation Age of Onset  . Hyperlipidemia Mother   . Hypertension Mother    Social History:  reports that he quit smoking about 30 years ago. He does not have any smokeless tobacco history on file. He reports that he does not drink alcohol or use illicit drugs.  Allergies: No Known Allergies  Medications Prior to Admission  Medication Sig Dispense Refill  . amLODipine (NORVASC) 10 MG tablet Take 10 mg by mouth every morning.       Marland Kitchen azelastine (ASTEPRO) 137 MCG/SPRAY nasal spray Place 1 spray into the nose 2 (two) times daily as needed. For dry nasal passages      . ferrous sulfate 325 (65 FE) MG tablet Take 325 mg by mouth 2 (two) times daily with a meal.       . gabapentin (NEURONTIN) 300 MG capsule  Take 300 mg by mouth 3 (three) times daily.       Marland Kitchen glimepiride (AMARYL) 2 MG tablet Take 2 mg by mouth daily before breakfast.       . HYDROcodone-acetaminophen (NORCO) 10-325 MG per tablet Take 1 tablet by mouth every 6 (six) hours as needed (PAIN).       Marland Kitchen losartan (COZAAR) 50 MG tablet Take 50 mg by mouth every morning.       . metFORMIN (GLUCOPHAGE-XR) 500 MG 24 hr tablet Take 1,000 mg by mouth 2 (two) times daily.       Marland Kitchen omeprazole (PRILOSEC) 20 MG capsule Take 20 mg by mouth 4 (four) times daily.       . Tamsulosin HCl (FLOMAX) 0.4 MG CAPS Take 0.4 mg by mouth 2 (two) times daily.       . traMADol (ULTRAM) 50 MG tablet Take 50 mg by mouth 4 (four) times daily -  with meals and at bedtime. For pain        Results for orders placed during the hospital encounter of 05/18/14 (from the past 48 hour(s))  GLUCOSE, CAPILLARY  Status: Abnormal   Collection Time    05/18/14 12:09 PM      Result Value Ref Range   Glucose-Capillary 156 (*) 70 - 99 mg/dL  POCT HEMOGLOBIN-HEMACUE     Status: None   Collection Time    05/18/14 12:11 PM      Result Value Ref Range   Hemoglobin 13.1  13.0 - 17.0 g/dL   No results found.  Review of Systems  All other systems reviewed and are negative.   Blood pressure 120/66, pulse 60, temperature 97.8 F (36.6 C), temperature source Oral, resp. rate 12, height 6' (1.829 m), weight 98.431 kg (217 lb), SpO2 99.00%. Physical Exam  Constitutional: He is oriented to person, place, and time. He appears well-developed and well-nourished.  HENT:  Head: Atraumatic.  Eyes: EOM are normal.  Cardiovascular: Intact distal pulses.   Respiratory: Effort normal.  Musculoskeletal:  Left shoulder pain and weakness with rotator cuff testing  Neurological: He is alert and oriented to person, place, and time.  Skin: Skin is warm and dry.  Psychiatric: He has a normal mood and affect.     Assessment/Plan Left shoulder small full-thickness rotator cuff tear Plan  left shoulder arthroscopic rotator cuff repair Risks / benefits of surgery discussed Consent on chart  NPO for OR Preop antibiotics   Jaidalyn Schillo WILLIAM 05/18/2014, 1:08 PM

## 2014-05-18 NOTE — Discharge Instructions (Signed)
Discharge Instructions after Arthroscopic Shoulder Repair ° ° °A sling has been provided for you. Remain in your sling at all times. This includes sleeping in your sling.  °Use ice on the shoulder intermittently over the first 48 hours after surgery.  °Pain medicine has been prescribed for you.  °Use your medicine liberally over the first 48 hours, and then you can begin to taper your use. You may take Extra Strength Tylenol or Tylenol only in place of the pain pills. DO NOT take ANY nonsteroidal anti-inflammatory pain medications: Advil, Motrin, Ibuprofen, Aleve, Naproxen, or Narprosyn.  °You may remove your dressing after two days. If the incision sites are still moist, place a Band-Aid over the moist site(s). Change Band-Aids daily until dry.  °You may shower 5 days after surgery. The incisions CANNOT get wet prior to 5 days. Simply allow the water to wash over the site and then pat dry. Do not rub the incisions. Make sure your axilla (armpit) is completely dry after showering.  °Take one aspirin a day for 2 weeks after surgery, unless you have an aspirin sensitivity/ allergy or asthma. ° ° °Please call [phone] during normal business hours or [phone] after hours for any problems. Including the following: ° °- excessive redness of the incisions °- drainage for more than 4 days °- fever of more than 101.5 F ° °*Please note that pain medications will not be refilled after hours or on weekends. ° ° ° °Post Anesthesia Home Care Instructions ° °Activity: °Get plenty of rest for the remainder of the day. A responsible adult should stay with you for 24 hours following the procedure.  °For the next 24 hours, DO NOT: °-Drive a car °-Operate machinery °-Drink alcoholic beverages °-Take any medication unless instructed by your physician °-Make any legal decisions or sign important papers. ° °Meals: °Start with liquid foods such as gelatin or soup. Progress to regular foods as tolerated. Avoid greasy, spicy, heavy  foods. If nausea and/or vomiting occur, drink only clear liquids until the nausea and/or vomiting subsides. Call your physician if vomiting continues. ° °Special Instructions/Symptoms: °Your throat may feel dry or sore from the anesthesia or the breathing tube placed in your throat during surgery. If this causes discomfort, gargle with warm salt water. The discomfort should disappear within 24 hours. ° ° °Regional Anesthesia Blocks ° °1. Numbness or the inability to move the "blocked" extremity may last from 3-48 hours after placement. The length of time depends on the medication injected and your individual response to the medication. If the numbness is not going away after 48 hours, call your surgeon. ° °2. The extremity that is blocked will need to be protected until the numbness is gone and the  Strength has returned. Because you cannot feel it, you will need to take extra care to avoid injury. Because it may be weak, you may have difficulty moving it or using it. You may not know what position it is in without looking at it while the block is in effect. ° °3. For blocks in the legs and feet, returning to weight bearing and walking needs to be done carefully. You will need to wait until the numbness is entirely gone and the strength has returned. You should be able to move your leg and foot normally before you try and bear weight or walk. You will need someone to be with you when you first try to ensure you do not fall and possibly risk injury. ° °4. Bruising and tenderness   at the needle site are common side effects and will resolve in a few days. ° °5. Persistent numbness or new problems with movement should be communicated to the surgeon or the [facility] ([phone])/ [facility] ([phone]). °

## 2014-05-18 NOTE — Op Note (Signed)
Procedure(s): SHOULDER ARTHROSCOPY WITH ROTATOR CUFF REPAIR Procedure Note  MAEJOR ERVEN male 68 y.o. 05/18/2014  Procedure(s) and Anesthesia Type:    * LEFT SHOULDER ARTHROSCOPY WITH ROTATOR CUFF REPAIR - General      Left shoulder biceps tenotomy/debridement long head biceps tendon tear      Left shoulder arthroscopic subacromial decompression  Surgeon(s) and Role:    * Nita Sells, MD - Primary     Surgeon: Nita Sells   Assistants: Jeanmarie Hubert PA-C (Danielle was present and scrubbed throughout the procedure and was essential in positioning, assisting with the camera and instrumentation,, and closure)  Anesthesia: General endotracheal anesthesia with preoperative interscalene block given by the attending anesthesiologist    Procedure Detail  SHOULDER ARTHROSCOPY WITH ROTATOR CUFF REPAIR  Estimated Blood Loss: Min         Drains: none  Blood Given: none         Specimens: none        Complications:  * No complications entered in OR log *         Disposition: PACU - hemodynamically stable.         Condition: stable    Procedure:   INDICATIONS FOR SURGERY: The patient is 68 y.o. male who has had a long history of left shoulder pain with a known small full-thickness rotator cuff tear. He failed conservative management with activity modification, exercises and injection therapy. He was indicated for surgery to decrease pain and restore function.  OPERATIVE FINDINGS: Examination under anesthesia: Mild stiffness, no instability Diagnostic Arthroscopy:  Glenoid articular cartilage: Some grade 2 changes Humeral head articular cartilage: Intact Labrum: Intact, long head biceps tendon with extensive proximal tearing greater than 50% tendon thickness, biceps tenotomy and debridement was carried out. 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 Moderate hooked acromial spur addressed with standard acromioplasty.  DESCRIPTION OF PROCEDURE: The patient was identified in preoperative  holding area where I personally marked the operative site after  verifying site, side, and procedure with the patient. An interscalene block was given by the attending anesthesiologist the holding area.  The patient was taken back to the operating room where general anesthesia was induced without complication and was placed in the beach-chair position with the back  elevated about 60 degrees and all extremities and head and neck carefully padded and  positioned.   The left upper extremity was then prepped and  draped in a standard sterile fashion. The appropriate time-out  procedure was carried out. The patient did receive IV antibiotics  within 30 minutes of incision.   A small posterior portal incision was made and the arthroscope was introduced into the joint. An anterior portal was then established above the subscapularis using needle localization. Small cannula was placed anteriorly. Diagnostic arthroscopy was then carried out with findings as described above.  The subscapularis was intact humeral joint surfaces were intact with some mild grade 2 changes on the anterior aspect of the glenoid. Humeral cartilage was intact. Biceps tendon was pulled into the joint and had extensive tearing the proximal long head involving greater than 50% of the thickness. This was felt to be a substantial pain generator and therefore a biceps tenotomy was carried out with a large biter through the anterior portal. The superior labrum was then debrided. The undersurface of the rotator cuff was examined and he was noted to have a small rotator cuff tear.  The arthroscope was then introduced into the subacromial space a standard lateral portal was established with needle localization. The shaver was used through the lateral portal to perform  extensive bursectomy. He had extensive thickened bursa. Coracoacromial ligament was examined and found to be severely frayed indicating chronic impingement.  The bursal surface of the rotator cuff was examined. There were a few strands of the anterior supraspinatus hanging on but these were lysed as they were not quality tissue. Once it was able to get back to good quality tissue the tear measured about a centimeter and a half anterior to posterior. He was able to be reduced back to the tuberosity without significant tension. Therefore the tuberosity was debrided with ArthroCare bur down to bleeding bone and a fiber tape suture was passed in an inverted mattress configuration into the good tendon medially. This was brought over into a 4.7 by swivel lock and the lateral half of the greater tuberosity. This brought the tendon nicely down. Anteriorly there was still one area which was not quite watertight sealed and the #2 FiberWire from the anchor was used to add additional fixation here. Final repair was felt to be excellent was not under significant tension.  The coracoacromial ligament was taken down off the anterior acromion with the ArthroCare exposing a moderate hooked anterior acromial spur. A high-speed bur was then used through the lateral portal to take down the anterior acromial spur from lateral to medial in a standard acromioplasty.  The acromioplasty was also viewed from the lateral portal and the bur was used as necessary to ensure that the acromion was completely flat from posterior to anterior.  The arthroscopic equipment was removed from the joint and the portals were closed with 3-0 nylon in an interrupted fashion. Sterile dressings were then applied including Xeroform 4 x 4's ABDs and tape. The patient was then allowed to awaken from general anesthesia, placed in a sling, transferred to the stretcher and taken to the recovery room in stable condition.   POSTOPERATIVE PLAN: The patient will  be discharged home today and will followup in one week for suture removal and wound check.  He will follow the standard cuff protocol.

## 2014-05-18 NOTE — Anesthesia Postprocedure Evaluation (Signed)
  Anesthesia Post-op Note  Patient: James [name]  Procedure(s) Performed: Procedure(s) with comments: SHOULDER ARTHROSCOPY WITH ROTATOR CUFF REPAIR (Left) - Left shoulder arthroscopy rotator cuff repair, subacromial decompression  Patient Location: PACU  Anesthesia Type:General and block  Level of Consciousness: awake and alert   Airway and Oxygen Therapy: Patient Spontanous Breathing  Post-op Pain: none  Post-op Assessment: Post-op Vital signs reviewed, Patient's Cardiovascular Status Stable and Respiratory Function Stable  Post-op Vital Signs: Reviewed  Filed Vitals:   05/18/14 1515  BP: 148/74  Pulse: 63  Temp:   Resp: 13    Complications: No apparent anesthesia complications

## 2014-05-18 NOTE — Anesthesia Preprocedure Evaluation (Addendum)
Anesthesia Evaluation  Patient identified by MRN, date of birth, ID band Patient awake    Reviewed: Allergy & Precautions, H&P , NPO status , Patient's Chart, lab work & pertinent test results  Airway Mallampati: II TM Distance: >3 FB Neck ROM: Full    Dental no notable dental hx. (+) Partial Upper, Partial Lower, Dental Advisory Given   Pulmonary neg pulmonary ROS, former smoker,  breath sounds clear to auscultation  Pulmonary exam normal       Cardiovascular hypertension, Pt. on home beta blockers Rhythm:Regular Rate:Normal     Neuro/Psych  Headaches, negative psych ROS   GI/Hepatic GERD-  Medicated and Controlled,(+) Hepatitis -, C  Endo/Other  diabetes, Type 2, Oral Hypoglycemic Agents  Renal/GU negative Renal ROS  negative genitourinary   Musculoskeletal  (+) Arthritis -,   Abdominal   Peds  Hematology negative hematology ROS (+) anemia ,   Anesthesia Other Findings   Reproductive/Obstetrics negative OB ROS                          Anesthesia Physical Anesthesia Plan  ASA: III  Anesthesia Plan: General and Regional   Post-op Pain Management:    Induction: Intravenous  Airway Management Planned: Oral ETT  Additional Equipment:   Intra-op Plan:   Post-operative Plan: Extubation in OR  Informed Consent: I have reviewed the patients History and Physical, chart, labs and discussed the procedure including the risks, benefits and alternatives for the proposed anesthesia with the patient or authorized representative who has indicated his/her understanding and acceptance.   Dental advisory given  Plan Discussed with: CRNA  Anesthesia Plan Comments:         Anesthesia Quick Evaluation

## 2014-05-18 NOTE — Anesthesia Procedure Notes (Addendum)
Anesthesia Regional Block:  Interscalene brachial plexus block  Pre-Anesthetic Checklist: ,, timeout performed, Correct Patient, Correct Site, Correct Laterality, Correct Procedure, Correct Position, site marked, Risks and benefits discussed, pre-op evaluation,  At surgeon's request and post-op pain management  Laterality: Left  Prep: Maximum Sterile Barrier Precautions used and chloraprep       Needles:  Injection technique: Single-shot  Needle Type: Echogenic Stimulator Needle     Needle Length: 5cm 5 cm Needle Gauge: 22 and 22 G    Additional Needles:  Procedures: ultrasound guided (picture in chart) and nerve stimulator Interscalene brachial plexus block  Nerve Stimulator or Paresthesia:  Response: Biceps response,   Additional Responses:   Narrative:  Start time: 05/18/2014 12:15 PM End time: 05/18/2014 12:24 PM Injection made incrementally with aspirations every 5 mL. Anesthesiologist: Ola Spurr, MD  Additional Notes: 2% Lidocaine skin wheel.    Procedure Name: Intubation Performed by: Terrance Mass Pre-anesthesia Checklist: Patient identified, Timeout performed, Emergency Drugs available, Suction available and Patient being monitored Patient Re-evaluated:Patient Re-evaluated prior to inductionOxygen Delivery Method: Circle system utilized Preoxygenation: Pre-oxygenation with 100% oxygen Intubation Type: IV induction Ventilation: Mask ventilation without difficulty Laryngoscope Size: Miller and 2 Grade View: Grade III Tube type: Oral Tube size: 7.0 mm Number of attempts: 2 Airway Equipment and Method: Stylet and Video-laryngoscopy Placement Confirmation: ETT inserted through vocal cords under direct vision,  breath sounds checked- equal and bilateral and positive ETCO2 Secured at: 23 cm Tube secured with: Tape Dental Injury: Teeth and Oropharynx as per pre-operative assessment     Performed by: Terrance Mass Difficulty Due To: Difficulty was  unanticipated, Difficult Airway- due to large tongue and Difficult Airway- due to anterior larynx Future Recommendations: Recommend- induction with short-acting agent, and alternative techniques readily available

## 2014-05-19 ENCOUNTER — Encounter (HOSPITAL_BASED_OUTPATIENT_CLINIC_OR_DEPARTMENT_OTHER): Payer: Self-pay | Admitting: Orthopedic Surgery

## 2014-05-20 DIAGNOSIS — J0100 Acute maxillary sinusitis, unspecified: Secondary | ICD-10-CM | POA: Diagnosis not present

## 2014-05-22 ENCOUNTER — Ambulatory Visit: Payer: Medicare Other | Admitting: Oncology

## 2014-05-22 ENCOUNTER — Other Ambulatory Visit: Payer: Medicare Other

## 2014-06-26 DIAGNOSIS — J0100 Acute maxillary sinusitis, unspecified: Secondary | ICD-10-CM | POA: Diagnosis not present

## 2014-08-04 DIAGNOSIS — M75122 Complete rotator cuff tear or rupture of left shoulder, not specified as traumatic: Secondary | ICD-10-CM | POA: Diagnosis not present

## 2014-08-05 DIAGNOSIS — J322 Chronic ethmoidal sinusitis: Secondary | ICD-10-CM | POA: Diagnosis not present

## 2014-08-05 DIAGNOSIS — J410 Simple chronic bronchitis: Secondary | ICD-10-CM | POA: Diagnosis not present

## 2014-08-05 DIAGNOSIS — J320 Chronic maxillary sinusitis: Secondary | ICD-10-CM | POA: Diagnosis not present

## 2014-08-14 DIAGNOSIS — H5202 Hypermetropia, left eye: Secondary | ICD-10-CM | POA: Diagnosis not present

## 2014-08-14 DIAGNOSIS — E119 Type 2 diabetes mellitus without complications: Secondary | ICD-10-CM | POA: Diagnosis not present

## 2014-08-14 DIAGNOSIS — Z9849 Cataract extraction status, unspecified eye: Secondary | ICD-10-CM | POA: Diagnosis not present

## 2014-08-14 DIAGNOSIS — H52223 Regular astigmatism, bilateral: Secondary | ICD-10-CM | POA: Diagnosis not present

## 2014-08-14 DIAGNOSIS — S0500XA Injury of conjunctiva and corneal abrasion without foreign body, unspecified eye, initial encounter: Secondary | ICD-10-CM | POA: Diagnosis not present

## 2014-08-14 DIAGNOSIS — Z961 Presence of intraocular lens: Secondary | ICD-10-CM | POA: Diagnosis not present

## 2014-08-14 DIAGNOSIS — H40013 Open angle with borderline findings, low risk, bilateral: Secondary | ICD-10-CM | POA: Diagnosis not present

## 2014-08-14 DIAGNOSIS — H524 Presbyopia: Secondary | ICD-10-CM | POA: Diagnosis not present

## 2014-08-18 DIAGNOSIS — M75122 Complete rotator cuff tear or rupture of left shoulder, not specified as traumatic: Secondary | ICD-10-CM | POA: Diagnosis not present

## 2014-08-19 DIAGNOSIS — E1165 Type 2 diabetes mellitus with hyperglycemia: Secondary | ICD-10-CM | POA: Diagnosis not present

## 2014-08-19 DIAGNOSIS — Z79899 Other long term (current) drug therapy: Secondary | ICD-10-CM | POA: Diagnosis not present

## 2014-08-19 DIAGNOSIS — E782 Mixed hyperlipidemia: Secondary | ICD-10-CM | POA: Diagnosis not present

## 2014-08-24 DIAGNOSIS — E1129 Type 2 diabetes mellitus with other diabetic kidney complication: Secondary | ICD-10-CM | POA: Diagnosis not present

## 2014-08-24 DIAGNOSIS — E1142 Type 2 diabetes mellitus with diabetic polyneuropathy: Secondary | ICD-10-CM | POA: Diagnosis not present

## 2014-08-24 DIAGNOSIS — I10 Essential (primary) hypertension: Secondary | ICD-10-CM | POA: Diagnosis not present

## 2014-08-24 DIAGNOSIS — E1165 Type 2 diabetes mellitus with hyperglycemia: Secondary | ICD-10-CM | POA: Diagnosis not present

## 2014-08-24 DIAGNOSIS — Z79899 Other long term (current) drug therapy: Secondary | ICD-10-CM | POA: Diagnosis not present

## 2014-08-24 DIAGNOSIS — R809 Proteinuria, unspecified: Secondary | ICD-10-CM | POA: Diagnosis not present

## 2014-08-24 DIAGNOSIS — E782 Mixed hyperlipidemia: Secondary | ICD-10-CM | POA: Diagnosis not present

## 2014-08-29 ENCOUNTER — Emergency Department (HOSPITAL_COMMUNITY)
Admission: EM | Admit: 2014-08-29 | Discharge: 2014-08-29 | Disposition: A | Discharge status: Home / Self Care | Payer: Commercial Managed Care - HMO | Attending: Emergency Medicine | Admitting: Emergency Medicine

## 2014-08-29 ENCOUNTER — Encounter (HOSPITAL_COMMUNITY): Payer: Self-pay | Admitting: Emergency Medicine

## 2014-08-29 DIAGNOSIS — Z87438 Personal history of other diseases of male genital organs: Secondary | ICD-10-CM | POA: Diagnosis not present

## 2014-08-29 DIAGNOSIS — T783XXA Angioneurotic edema, initial encounter: Secondary | ICD-10-CM

## 2014-08-29 DIAGNOSIS — M1990 Unspecified osteoarthritis, unspecified site: Secondary | ICD-10-CM | POA: Diagnosis not present

## 2014-08-29 DIAGNOSIS — E119 Type 2 diabetes mellitus without complications: Secondary | ICD-10-CM | POA: Insufficient documentation

## 2014-08-29 DIAGNOSIS — D649 Anemia, unspecified: Secondary | ICD-10-CM | POA: Insufficient documentation

## 2014-08-29 DIAGNOSIS — Z79899 Other long term (current) drug therapy: Secondary | ICD-10-CM | POA: Diagnosis not present

## 2014-08-29 DIAGNOSIS — I10 Essential (primary) hypertension: Secondary | ICD-10-CM | POA: Insufficient documentation

## 2014-08-29 DIAGNOSIS — K219 Gastro-esophageal reflux disease without esophagitis: Secondary | ICD-10-CM | POA: Diagnosis not present

## 2014-08-29 DIAGNOSIS — Z8701 Personal history of pneumonia (recurrent): Secondary | ICD-10-CM | POA: Diagnosis not present

## 2014-08-29 DIAGNOSIS — T7840XA Allergy, unspecified, initial encounter: Secondary | ICD-10-CM | POA: Diagnosis present

## 2014-08-29 DIAGNOSIS — Z87891 Personal history of nicotine dependence: Secondary | ICD-10-CM | POA: Diagnosis not present

## 2014-08-29 LAB — I-STAT CHEM 8, ED
BUN: 17 mg/dL (ref 6–23)
Calcium, Ion: 1.17 mmol/L (ref 1.13–1.30)
Chloride: 103 mmol/L (ref 96–112)
Creatinine, Ser: 1.2 mg/dL (ref 0.50–1.35)
Glucose, Bld: 217 mg/dL — ABNORMAL HIGH (ref 70–99)
HCT: 41 % (ref 39.0–52.0)
Hemoglobin: 13.9 g/dL (ref 13.0–17.0)
Potassium: 4.2 mmol/L (ref 3.5–5.1)
Sodium: 139 mmol/L (ref 135–145)
TCO2: 22 mmol/L (ref 0–100)

## 2014-08-29 MED ORDER — DIPHENHYDRAMINE HCL 50 MG/ML IJ SOLN
25.0000 mg | Freq: Once | INTRAMUSCULAR | Status: AC
  Administered 2014-08-29: 25 mg via INTRAVENOUS
  Filled 2014-08-29: qty 1

## 2014-08-29 MED ORDER — DIPHENHYDRAMINE HCL 25 MG PO TABS: 25.0000 mg | ORAL_TABLET | Freq: Four times a day (QID) | ORAL | Status: DC

## 2014-08-29 MED ORDER — FAMOTIDINE 20 MG PO TABS: 20.0000 mg | ORAL_TABLET | Freq: Two times a day (BID) | ORAL | Status: DC

## 2014-08-29 MED ORDER — FAMOTIDINE IN NACL 20-0.9 MG/50ML-% IV SOLN
20.0000 mg | Freq: Once | INTRAVENOUS | Status: AC
Start: 2014-08-29 — End: 2014-08-29
  Administered 2014-08-29: 20 mg via INTRAVENOUS
  Filled 2014-08-29: qty 50

## 2014-08-29 MED ORDER — METHYLPREDNISOLONE SODIUM SUCC 125 MG IJ SOLR
125.0000 mg | Freq: Once | INTRAMUSCULAR | Status: AC
  Administered 2014-08-29: 125 mg via INTRAVENOUS
  Filled 2014-08-29: qty 2

## 2014-08-29 NOTE — ED Provider Notes (Signed)
CSN: [id number]     Arrival date & time 08/29/14  2021 History   First MD Initiated Contact with Patient 08/29/14 2029     Chief Complaint  Patient presents with  . Allergic Reaction      HPI  Expand All Collapse All   Pt recently had medication changed for DM, changed to Invokana. Patient has gross facial swelling involved lips .  Patient has no difficulty swallowing.  Patient has no swelling to his tongue.        Past Medical History  Diagnosis Date  . Diabetes mellitus   . Hypertension   . Arthritis   . Thyroid disease     goiter  . HLD (hyperlipidemia)   . Anemia   . Pneumonia 07/2011  . GERD (gastroesophageal reflux disease)   . BPH (benign prostatic hyperplasia)   . Headache(784.0)     migraines, sinus headaches  . Hepatitis     C and B-treated  . Urinary frequency    Past Surgical History  Procedure Laterality Date  . Cholecystectomy    . Abdominal surgery    . Hernia repair    . Colonoscopy    . Back surgery      x 5   . Tonsillectomy    . Hand surgery      right finger  . Sinus surgery    . Rotator cuff repair      bilateral  . Cyst removal skull  20 years ago  . Eye surgery      bilat cataract with lens implants  . Ethmoidectomy  10/12/2011    Procedure: ETHMOIDECTOMY;  Surgeon: Thornell Sartorius, MD;  Location: [facility];  Service: ENT;  Laterality: Bilateral;  bilateral maxillary sinus osteal enlargement, frontal sinusotomy  . Shoulder arthroscopy with rotator cuff repair Left 05/18/2014    Procedure: SHOULDER ARTHROSCOPY WITH ROTATOR CUFF REPAIR;  Surgeon: Nita Sells, MD;  Location: [facility];  Service: Orthopedics;  Laterality: Left;  Left shoulder arthroscopy rotator cuff repair, subacromial decompression   Family History  Problem Relation Age of Onset  . Hyperlipidemia Mother   . Hypertension Mother    History  Substance Use Topics  . Smoking status: Former Smoker    Quit date: 05/12/1984  . Smokeless tobacco: Not  on file  . Alcohol Use: No    Review of Systems  All other systems reviewed and are negative  Allergies  Invokana  Home Medications   Prior to Admission medications   Medication Sig Start Date End Date Taking? Authorizing Provider  amLODipine (NORVASC) 10 MG tablet Take 10 mg by mouth every morning.    Yes Historical Provider, MD  azelastine (ASTEPRO) 137 MCG/SPRAY nasal spray Place 1 spray into the nose 2 (two) times daily as needed. For dry nasal passages   Yes Historical Provider, MD  ferrous sulfate 325 (65 FE) MG tablet Take 325 mg by mouth 2 (two) times daily with a meal.    Yes Historical Provider, MD  gabapentin (NEURONTIN) 300 MG capsule Take 300 mg by mouth 3 (three) times daily.    Yes Historical Provider, MD  glimepiride (AMARYL) 2 MG tablet Take 2 mg by mouth daily before breakfast.    Yes Historical Provider, MD  HYDROcodone-acetaminophen (NORCO) 10-325 MG per tablet Take 1 tablet by mouth every 6 (six) hours as needed for moderate pain.   Yes Historical Provider, MD  losartan (COZAAR) 50 MG tablet Take 50 mg by mouth every morning.  Yes Historical Provider, MD  metFORMIN (GLUCOPHAGE-XR) 500 MG 24 hr tablet Take 1,000 mg by mouth 2 (two) times daily.    Yes Historical Provider, MD  omeprazole (PRILOSEC) 20 MG capsule Take 20 mg by mouth 4 (four) times daily.    Yes Historical Provider, MD  Tamsulosin HCl (FLOMAX) 0.4 MG CAPS Take 0.4 mg by mouth 2 (two) times daily.    Yes Historical Provider, MD  traMADol (ULTRAM) 50 MG tablet Take 50 mg by mouth 4 (four) times daily -  with meals and at bedtime. For pain   Yes Historical Provider, MD  diphenhydrAMINE (BENADRYL) 25 MG tablet Take 1 tablet (25 mg total) by mouth every 6 (six) hours. 08/29/14   Dot Lanes, MD  docusate sodium (COLACE) 100 MG capsule Take 1 capsule (100 mg total) by mouth 3 (three) times daily as needed. Patient not taking: Reported on 08/29/2014 05/18/14   Grier Mitts, PA-C  famotidine (PEPCID)  20 MG tablet Take 1 tablet (20 mg total) by mouth 2 (two) times daily. 08/29/14   Dot Lanes, MD  oxyCODONE-acetaminophen (ROXICET) 5-325 MG per tablet Take 1-2 tablets by mouth every 4 (four) hours as needed for severe pain. Patient not taking: Reported on 08/29/2014 05/18/14   Grier Mitts, PA-C   BP 138/61 mmHg  Pulse 67  Temp(Src) 98.3 F (36.8 C) (Oral)  Resp 12  Ht 6' (1.829 m)  Wt 215 lb (97.523 kg)  BMI 29.15 kg/m2  SpO2 96% Physical Exam  Constitutional: He is oriented to person, place, and time. He appears well-developed and well-nourished. No distress.  HENT:  Head: Normocephalic and atraumatic.  Mouth/Throat: Oropharynx is clear and moist and mucous membranes are normal.  Tongue is without swelling.  Both upper and lower lips are swollen.  No evidence of stridor difficulty speaking or swallowing.  Eyes: Pupils are equal, round, and reactive to light.  Neck: Normal range of motion.  Cardiovascular: Normal rate and intact distal pulses.   Pulmonary/Chest: No respiratory distress.  Abdominal: Normal appearance. He exhibits no distension.  Musculoskeletal: Normal range of motion.  Neurological: He is alert and oriented to person, place, and time. No cranial nerve deficit.  Skin: Skin is warm and dry. No rash noted.  Psychiatric: He has a normal mood and affect. His behavior is normal.  Nursing note and vitals reviewed.   ED Course  Procedures (including critical care time) Medications  methylPREDNISolone sodium succinate (SOLU-MEDROL) 125 mg/2 mL injection 125 mg (125 mg Intravenous Given 08/29/14 2045)  diphenhydrAMINE (BENADRYL) injection 25 mg (25 mg Intravenous Given 08/29/14 2045)  famotidine (PEPCID) IVPB 20 mg (0 mg Intravenous Stopped 08/29/14 2120)    Labs Review Labs Reviewed  I-STAT CHEM 8, ED - Abnormal; Notable for the following:    Glucose, Bld 217 (*)    All other components within normal limits     MDM    After treatment in the ED the  patient feels back to baseline and wants to go home. Final diagnoses:  Allergic angioedema, initial encounter        Dot Lanes, MD 08/29/14 2308

## 2014-08-29 NOTE — ED Notes (Signed)
Pt recently had medication changed for DM, changed to [place]. Patient has gross facial swelling involved lips and throats. Reports SOB. Ambulatory. RR even but labored.

## 2014-08-29 NOTE — Discharge Instructions (Signed)
Stop taking Invokana  Angioedema Angioedema is a sudden swelling of tissues, often of the skin. It can occur on the face or genitals or in the abdomen or other body parts. The swelling usually develops over a short period and gets better in 24 to 48 hours. It often begins during the night and is found when the person wakes up. The person may also get red, itchy patches of skin (hives). Angioedema can be dangerous if it involves swelling of the air passages.  Depending on the cause, episodes of angioedema may only happen once, come back in unpredictable patterns, or repeat for several years and then gradually fade away.  CAUSES  Angioedema can be caused by an allergic reaction to various triggers. It can also result from nonallergic causes, including reactions to drugs, immune system disorders, viral infections, or an abnormal gene that is passed to you from your parents (hereditary). For some people with angioedema, the cause is unknown.  Some things that can trigger angioedema include:   Foods.   Medicines, such as ACE inhibitors, ARBs, nonsteroidal anti-inflammatory agents, or estrogen.   Latex.   Animal saliva.   Insect stings.   Dyes used in X-rays.   Mild injury.   Dental work.  Surgery.  Stress.   Sudden changes in temperature.   Exercise. SIGNS AND SYMPTOMS   Swelling of the skin.  Hives. If these are present, there is also intense itching.  Redness in the affected area.   Pain in the affected area.  Swollen lips or tongue.  Breathing problems. This may happen if the air passages swell.  Wheezing. If internal organs are involved, there may be:   Nausea.   Abdominal pain.   Vomiting.   Difficulty swallowing.   Difficulty passing urine. DIAGNOSIS   Your health care provider will examine the affected area and take a medical and family history.  Various tests may be done to help determine the cause. Tests may include:  Allergy  skin tests to see if the problem is an allergic reaction.   Blood tests to check for hereditary angioedema.   Tests to check for underlying diseases that could cause the condition.   A review of your medicines, including over-the-counter medicines, may be done. TREATMENT  Treatment will depend on the cause of the angioedema. Possible treatments include:   Removal of anything that triggered the condition (such as stopping certain medicines).   Medicines to treat symptoms or prevent attacks. Medicines given may include:   Antihistamines.   Epinephrine injection.   Steroids.   Hospitalization may be required for severe attacks. If the air passages are affected, it can be an emergency. Tubes may need to be placed to keep the airway open. HOME CARE INSTRUCTIONS   Take all medicines as directed by your health care provider.  If you were given medicines for emergency allergy treatment, always carry them with you.  Wear a medical bracelet as directed by your health care provider.   Avoid known triggers. SEEK MEDICAL CARE IF:   You have repeat attacks of angioedema.   Your attacks are more frequent or more severe despite preventive measures.   You have hereditary angioedema and are considering having children. It is important to discuss with your health care provider the risks of passing the condition on to your children. SEEK IMMEDIATE MEDICAL CARE IF:   You have severe swelling of the mouth, tongue, or lips.  You have difficulty breathing.   You have difficulty  swallowing.   You faint. MAKE SURE YOU:  Understand these instructions.  Will watch your condition.  Will get help right away if you are not doing well or get worse. Document Released: 09/25/2001 Document Revised: 12/01/2013 Document Reviewed: 03/10/2013 [facility] Patient Information 2015 [place], [place]. This information is not intended to replace advice given to you by your health care provider.  Make sure you discuss any questions you have with your health care provider.

## 2014-09-01 DIAGNOSIS — E1129 Type 2 diabetes mellitus with other diabetic kidney complication: Secondary | ICD-10-CM | POA: Diagnosis not present

## 2014-09-01 DIAGNOSIS — E1165 Type 2 diabetes mellitus with hyperglycemia: Secondary | ICD-10-CM | POA: Diagnosis not present

## 2014-09-01 DIAGNOSIS — R809 Proteinuria, unspecified: Secondary | ICD-10-CM | POA: Diagnosis not present

## 2014-09-01 DIAGNOSIS — E782 Mixed hyperlipidemia: Secondary | ICD-10-CM | POA: Diagnosis not present

## 2014-09-01 DIAGNOSIS — Z79899 Other long term (current) drug therapy: Secondary | ICD-10-CM | POA: Diagnosis not present

## 2014-09-01 DIAGNOSIS — I10 Essential (primary) hypertension: Secondary | ICD-10-CM | POA: Diagnosis not present

## 2014-09-01 DIAGNOSIS — L500 Allergic urticaria: Secondary | ICD-10-CM | POA: Diagnosis not present

## 2014-09-01 DIAGNOSIS — T783XXA Angioneurotic edema, initial encounter: Secondary | ICD-10-CM | POA: Diagnosis not present

## 2014-09-02 DIAGNOSIS — M792 Neuralgia and neuritis, unspecified: Secondary | ICD-10-CM | POA: Diagnosis not present

## 2014-09-02 DIAGNOSIS — M79609 Pain in unspecified limb: Secondary | ICD-10-CM | POA: Diagnosis not present

## 2014-09-02 DIAGNOSIS — B351 Tinea unguium: Secondary | ICD-10-CM | POA: Diagnosis not present

## 2014-09-07 DIAGNOSIS — N5201 Erectile dysfunction due to arterial insufficiency: Secondary | ICD-10-CM | POA: Diagnosis not present

## 2014-09-07 DIAGNOSIS — N401 Enlarged prostate with lower urinary tract symptoms: Secondary | ICD-10-CM | POA: Diagnosis not present

## 2014-09-07 DIAGNOSIS — R350 Frequency of micturition: Secondary | ICD-10-CM | POA: Diagnosis not present

## 2014-09-11 DIAGNOSIS — M75122 Complete rotator cuff tear or rupture of left shoulder, not specified as traumatic: Secondary | ICD-10-CM | POA: Diagnosis not present

## 2014-09-17 DIAGNOSIS — I10 Essential (primary) hypertension: Secondary | ICD-10-CM | POA: Diagnosis not present

## 2014-09-17 DIAGNOSIS — R809 Proteinuria, unspecified: Secondary | ICD-10-CM | POA: Diagnosis not present

## 2014-09-17 DIAGNOSIS — E1129 Type 2 diabetes mellitus with other diabetic kidney complication: Secondary | ICD-10-CM | POA: Diagnosis not present

## 2014-09-17 DIAGNOSIS — E1142 Type 2 diabetes mellitus with diabetic polyneuropathy: Secondary | ICD-10-CM | POA: Diagnosis not present

## 2014-09-17 DIAGNOSIS — Z79899 Other long term (current) drug therapy: Secondary | ICD-10-CM | POA: Diagnosis not present

## 2014-09-17 DIAGNOSIS — L500 Allergic urticaria: Secondary | ICD-10-CM | POA: Diagnosis not present

## 2014-09-17 DIAGNOSIS — E1165 Type 2 diabetes mellitus with hyperglycemia: Secondary | ICD-10-CM | POA: Diagnosis not present

## 2014-09-17 DIAGNOSIS — E782 Mixed hyperlipidemia: Secondary | ICD-10-CM | POA: Diagnosis not present

## 2014-10-09 DIAGNOSIS — Z79899 Other long term (current) drug therapy: Secondary | ICD-10-CM | POA: Diagnosis not present

## 2014-10-09 DIAGNOSIS — M545 Low back pain: Secondary | ICD-10-CM | POA: Diagnosis not present

## 2014-10-09 DIAGNOSIS — M5136 Other intervertebral disc degeneration, lumbar region: Secondary | ICD-10-CM | POA: Diagnosis not present

## 2014-10-09 DIAGNOSIS — M542 Cervicalgia: Secondary | ICD-10-CM | POA: Diagnosis not present

## 2014-10-19 DIAGNOSIS — M545 Low back pain: Secondary | ICD-10-CM | POA: Diagnosis not present

## 2014-10-20 DIAGNOSIS — M545 Low back pain: Secondary | ICD-10-CM | POA: Diagnosis not present

## 2014-10-20 DIAGNOSIS — R937 Abnormal findings on diagnostic imaging of other parts of musculoskeletal system: Secondary | ICD-10-CM | POA: Diagnosis not present

## 2014-10-20 DIAGNOSIS — Z79899 Other long term (current) drug therapy: Secondary | ICD-10-CM | POA: Diagnosis not present

## 2014-10-21 ENCOUNTER — Telehealth: Payer: Self-pay | Admitting: Oncology

## 2014-10-21 ENCOUNTER — Other Ambulatory Visit: Payer: Self-pay | Admitting: Oncology

## 2014-10-21 DIAGNOSIS — D472 Monoclonal gammopathy: Secondary | ICD-10-CM

## 2014-10-21 NOTE — Telephone Encounter (Signed)
Spoke with patient and he is aware of his appointment °

## 2014-10-29 ENCOUNTER — Ambulatory Visit (HOSPITAL_BASED_OUTPATIENT_CLINIC_OR_DEPARTMENT_OTHER): Payer: Commercial Managed Care - HMO | Admitting: Oncology

## 2014-10-29 ENCOUNTER — Other Ambulatory Visit (HOSPITAL_BASED_OUTPATIENT_CLINIC_OR_DEPARTMENT_OTHER): Payer: Commercial Managed Care - HMO

## 2014-10-29 VITALS — BP 146/72 | HR 80 | Temp 97.5°F | Resp 18 | Ht 72.0 in | Wt 218.8 lb

## 2014-10-29 DIAGNOSIS — M549 Dorsalgia, unspecified: Secondary | ICD-10-CM | POA: Diagnosis not present

## 2014-10-29 DIAGNOSIS — M25551 Pain in right hip: Secondary | ICD-10-CM

## 2014-10-29 DIAGNOSIS — D472 Monoclonal gammopathy: Secondary | ICD-10-CM

## 2014-10-29 LAB — CBC WITH DIFFERENTIAL/PLATELET
BASO%: 0.6 % (ref 0.0–2.0)
Basophils Absolute: 0 10*3/uL (ref 0.0–0.1)
EOS%: 3.9 % (ref 0.0–7.0)
Eosinophils Absolute: 0.2 10*3/uL (ref 0.0–0.5)
HCT: 37.2 % — ABNORMAL LOW (ref 38.4–49.9)
HGB: 12.4 g/dL — ABNORMAL LOW (ref 13.0–17.1)
LYMPH%: 36.9 % (ref 14.0–49.0)
MCH: 29.5 pg (ref 27.2–33.4)
MCHC: 33.3 g/dL (ref 32.0–36.0)
MCV: 88.7 fL (ref 79.3–98.0)
MONO#: 0.5 10*3/uL (ref 0.1–0.9)
MONO%: 8 % (ref 0.0–14.0)
NEUT#: 2.9 10*3/uL (ref 1.5–6.5)
NEUT%: 50.6 % (ref 39.0–75.0)
Platelets: 198 10*3/uL (ref 140–400)
RBC: 4.2 10*6/uL (ref 4.20–5.82)
RDW: 13.9 % (ref 11.0–14.6)
WBC: 5.7 10*3/uL (ref 4.0–10.3)
lymph#: 2.1 10*3/uL (ref 0.9–3.3)

## 2014-10-29 LAB — COMPREHENSIVE METABOLIC PANEL (CC13)
ALT: 14 U/L (ref 0–55)
AST: 15 U/L (ref 5–34)
Albumin: 3.8 g/dL (ref 3.5–5.0)
Alkaline Phosphatase: 75 U/L (ref 40–150)
Anion Gap: 13 mEq/L — ABNORMAL HIGH (ref 3–11)
BUN: 12 mg/dL (ref 7.0–26.0)
CO2: 21 mEq/L — ABNORMAL LOW (ref 22–29)
Calcium: 9.1 mg/dL (ref 8.4–10.4)
Chloride: 106 mEq/L (ref 98–109)
Creatinine: 1.1 mg/dL (ref 0.7–1.3)
EGFR: 79 mL/min/{1.73_m2} — ABNORMAL LOW (ref >90)
Glucose: 173 mg/dl — ABNORMAL HIGH (ref 70–140)
Potassium: 3.9 mEq/L (ref 3.5–5.1)
Sodium: 140 mEq/L (ref 136–145)
Total Bilirubin: 0.34 mg/dL (ref 0.20–1.20)
Total Protein: 7.8 g/dL (ref 6.4–8.3)

## 2014-10-29 NOTE — Progress Notes (Signed)
Hematology and Oncology Follow Up Visit  James [name] [id number] 09-03-1945 69 y.o. 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 He has elevated IgG and IgA.  Current therapy: Observation and surveillance.  Interim History:  James [name] returns today for a follow up visit. He has not been in follow-up since October 2014. Since the last visit, he is actually be doing very well. He has not reported any GI bleeding or hematochezia. He does have chronic back pain but most recently developed right-sided hip and pelvic pain. He underwent MRI of the lumbar spine on 10/19/2014. The MRI showed multiple small hypodense lesions throughout the marrow spine which are more visible on the precontrast T1 images. No pathological fracture, intradural metastasis or paraspinal lymphadenopathy noted.   He is not reporting any abdominal pain or rectal bleeding. No recent hospitalizations or illness. He reports no pathological fractures, no recent infections.  He does not report any headaches, blurry vision, syncope or seizures. He does not report any fevers or chills or sweats. Does not report any cough or hemoptysis or hematemesis. Standpoint nausea or vomiting or abdominal pain. Does not report any frequency urgency or hesitancy. He does not report any lymphadenopathy or petechiae. The remaining review of systems unremarkable.    Medications: I have reviewed the patient's current medications.  Current Outpatient Prescriptions  Medication Sig Dispense Refill  . amLODipine (NORVASC) 10 MG tablet Take 10 mg by mouth every morning.     Marland Kitchen azelastine (ASTEPRO) 137 MCG/SPRAY nasal spray Place 1 spray into the nose 2 (two) times daily as needed. For dry nasal passages    . diphenhydrAMINE (BENADRYL) 25 MG tablet Take 1 tablet (25 mg total) by mouth every 6 (six) hours. 10 tablet 0  . docusate sodium  (COLACE) 100 MG capsule Take 1 capsule (100 mg total) by mouth 3 (three) times daily as needed. 20 capsule 0  . famotidine (PEPCID) 20 MG tablet Take 1 tablet (20 mg total) by mouth 2 (two) times daily. 10 tablet 0  . ferrous sulfate 325 (65 FE) MG tablet Take 325 mg by mouth 2 (two) times daily with a meal.     . gabapentin (NEURONTIN) 300 MG capsule Take 300 mg by mouth 3 (three) times daily.     Marland Kitchen glimepiride (AMARYL) 2 MG tablet Take 2 mg by mouth daily before breakfast.     . HYDROcodone-acetaminophen (NORCO) 10-325 MG per tablet Take 1 tablet by mouth every 6 (six) hours as needed for moderate pain.    Marland Kitchen losartan (COZAAR) 50 MG tablet Take 50 mg by mouth every morning.     . metFORMIN (GLUCOPHAGE-XR) 500 MG 24 hr tablet Take 1,000 mg by mouth 2 (two) times daily.     Marland Kitchen omeprazole (PRILOSEC) 20 MG capsule Take 20 mg by mouth 4 (four) times daily.     Marland Kitchen oxyCODONE-acetaminophen (ROXICET) 5-325 MG per tablet Take 1-2 tablets by mouth every 4 (four) hours as needed for severe pain. 60 tablet 0  . Tamsulosin HCl (FLOMAX) 0.4 MG CAPS Take 0.4 mg by mouth 2 (two) times daily.     . traMADol (ULTRAM) 50 MG tablet Take 50 mg by mouth 4 (four) times daily -  with meals and at bedtime. For pain    . [DISCONTINUED] terazosin (HYTRIN) 5 MG capsule Take 5 mg by mouth 2 (two) times daily.  No current facility-administered medications for this visit.    Allergies:  Allergies  Allergen Reactions  . Invokana [Canagliflozin] Anaphylaxis    Facial and lip swelling    Past Medical History, Surgical history, Social history, and Family History were reviewed and updated.   Physical Exam: Blood pressure 146/72, pulse 80, temperature 97.5 F (36.4 C), temperature source Oral, resp. rate 18, height 6' (1.829 m), weight 218 lb 12.8 oz (99.247 kg), SpO2 98 %. ECOG: 1 General appearance: alert Head: Normocephalic, without obvious abnormality, atraumatic Neck: no adenopathy Lymph nodes: Cervical,  supraclavicular, and axillary nodes normal. Heart:regular rate and rhythm, S1, S2 normal, no murmur, click, rub or gallop Lung:chest clear, no wheezing, rales, normal symmetric air entry, no tachypnea, retractions or cyanosis Abdomin: soft, non-tender, without masses or organomegaly EXT:no erythema, induration, or nodules   Lab Results: Lab Results  Component Value Date   WBC 5.7 10/29/2014   HGB 12.4* 10/29/2014   HCT 37.2* 10/29/2014   MCV 88.7 10/29/2014   PLT 198 10/29/2014     Chemistry      Component Value Date/Time   NA 139 08/29/2014 2130   NA 138 05/21/2013 0943   K 4.2 08/29/2014 2130   K 3.7 05/21/2013 0943   CL 103 08/29/2014 2130   CL 105 08/09/2012 0918   CO2 23 05/13/2014 1010   CO2 20* 05/21/2013 0943   BUN 17 08/29/2014 2130   BUN 10.5 05/21/2013 0943   CREATININE 1.20 08/29/2014 2130   CREATININE 1.0 05/21/2013 0943      Component Value Date/Time   CALCIUM 9.3 05/13/2014 1010   CALCIUM 9.5 05/21/2013 0943   ALKPHOS 62 05/21/2013 0943   ALKPHOS 58 02/09/2012 0930   AST 16 05/21/2013 0943   AST 37 02/09/2012 0930   ALT 11 05/21/2013 0943   ALT 46 02/09/2012 0930   BILITOT <0.20 05/21/2013 0943   BILITOT 0.3 02/09/2012 0930         Impression and Plan:  69 year old with:  1. IgG and IgA Kappa monoclonal gammopathy. He had been on active surveillance previously but failed to show up since October 2014. His M spike was less than 1 g/dL with an elevated IgA level of 986. He did not have any underlying damage previously. Recently he had an MRI with bone marrow signal abnormality suggesting a hematological disorder. His hemoglobin continued to be normal and his creatinine was 1.2 in January 2016. I have repeated his serum protein electrophoresis and chemistries for staging purposes. I have also discussed with him the possibility of needing a bone marrow biopsy to determine the etiology of abnormal marrow signal abnormality whether it is related to his  history of the biclonal gammopathy.  The risks and benefits of bone marrow biopsy were discussed. Complications include pain, bleeding and infection were reviewed with the patient today. We'll await the results of the serum protein electrophoresis and we'll arrange for it in the near future if his serum protein electrophoresis is different than what it was in 2014.  2.  Back pain and hip pain: Unrelated to his hematological disorder or the marrow signal abnormalities. I have asked him to continue to follow-up with the pain clinic and his primary care provider.  3.  Follow-up: Will be determined by the results of laboratory testing and potential bone marrow biopsy.   [facility], MD 3/31/20164:15 PM

## 2014-11-02 LAB — KAPPA/LAMBDA LIGHT CHAINS
Kappa free light chain: 2.24 mg/dL — ABNORMAL HIGH (ref 0.33–1.94)
Kappa:Lambda Ratio: 1.74 — ABNORMAL HIGH (ref 0.26–1.65)
Lambda Free Lght Chn: 1.29 mg/dL (ref 0.57–2.63)

## 2014-11-02 LAB — SPEP & IFE WITH QIG
Abnormal Protein Band1: 1 g/dL
Albumin ELP: 4.2 g/dL (ref 3.8–4.8)
Alpha-1-Globulin: 0.2 g/dL (ref 0.2–0.3)
Alpha-2-Globulin: 0.7 g/dL (ref 0.5–0.9)
Beta 2: 1.6 g/dL — ABNORMAL HIGH (ref 0.2–0.5)
Beta Globulin: 0.5 g/dL (ref 0.4–0.6)
Gamma Globulin: 0.6 g/dL — ABNORMAL LOW (ref 0.8–1.7)
IgA: 1320 mg/dL — ABNORMAL HIGH (ref 68–379)
IgG (Immunoglobin G), Serum: 805 mg/dL (ref 650–1600)
IgM, Serum: 10 mg/dL — ABNORMAL LOW (ref 41–251)
Total Protein, Serum Electrophoresis: 7.9 g/dL (ref 6.1–8.1)

## 2014-11-05 ENCOUNTER — Other Ambulatory Visit: Payer: Self-pay | Admitting: Oncology

## 2014-11-05 ENCOUNTER — Telehealth: Payer: Self-pay | Admitting: *Deleted

## 2014-11-05 DIAGNOSIS — D729 Disorder of white blood cells, unspecified: Secondary | ICD-10-CM

## 2014-11-05 NOTE — Progress Notes (Signed)
The results of his serum protein electrophoresis and protein studies were reviewed today and discussed with the patient over the phone. I not dramatically changed from these studies 2 years ago. His MRI imaging studies were also reviewed with radiology and suggest some marrow signal abnormalities.  Given these findings, I have recommended a bone marrow biopsy for James [name]. The risks and benefits of bone marrow biopsy were discussed again today and is agreeable to proceed. I'll make a referral to interventional radiology to have that done in the near future.

## 2014-11-05 NOTE — Telephone Encounter (Signed)
Pt. Called requesting last weeks lab results (10/29/14)   [phone]

## 2014-11-12 ENCOUNTER — Other Ambulatory Visit: Payer: Self-pay | Admitting: Radiology

## 2014-11-13 ENCOUNTER — Ambulatory Visit (HOSPITAL_COMMUNITY)
Admission: RE | Admit: 2014-11-13 | Discharge: 2014-11-13 | Disposition: A | Discharge status: Home / Self Care | Payer: Commercial Managed Care - HMO | Source: Ambulatory Visit | Attending: Oncology | Admitting: Oncology

## 2014-11-13 ENCOUNTER — Encounter (HOSPITAL_COMMUNITY): Payer: Self-pay

## 2014-11-13 DIAGNOSIS — D729 Disorder of white blood cells, unspecified: Secondary | ICD-10-CM | POA: Insufficient documentation

## 2014-11-13 DIAGNOSIS — D649 Anemia, unspecified: Secondary | ICD-10-CM | POA: Diagnosis not present

## 2014-11-13 DIAGNOSIS — D472 Monoclonal gammopathy: Secondary | ICD-10-CM | POA: Diagnosis not present

## 2014-11-13 DIAGNOSIS — C9000 Multiple myeloma not having achieved remission: Secondary | ICD-10-CM | POA: Diagnosis not present

## 2014-11-13 LAB — CBC
HCT: 37.5 % — ABNORMAL LOW (ref 39.0–52.0)
Hemoglobin: 12.4 g/dL — ABNORMAL LOW (ref 13.0–17.0)
MCH: 29.6 pg (ref 26.0–34.0)
MCHC: 33.1 g/dL (ref 30.0–36.0)
MCV: 89.5 fL (ref 78.0–100.0)
Platelets: 205 10*3/uL (ref 150–400)
RBC: 4.19 MIL/uL — ABNORMAL LOW (ref 4.22–5.81)
RDW: 13.9 % (ref 11.5–15.5)
WBC: 5.1 10*3/uL (ref 4.0–10.5)

## 2014-11-13 LAB — PROTIME-INR
INR: 1.13 (ref 0.00–1.49)
Prothrombin Time: 14.7 seconds (ref 11.6–15.2)

## 2014-11-13 LAB — GLUCOSE, CAPILLARY
Glucose-Capillary: 160 mg/dL — ABNORMAL HIGH (ref 70–99)
Glucose-Capillary: 150 mg/dL — ABNORMAL HIGH (ref 70–99)

## 2014-11-13 LAB — BONE MARROW EXAM

## 2014-11-13 LAB — APTT: aPTT: 30 seconds (ref 24–37)

## 2014-11-13 MED ORDER — SODIUM CHLORIDE 0.9 % IV SOLN
INTRAVENOUS | Status: DC
  Administered 2014-11-13: 10:00:00 via INTRAVENOUS

## 2014-11-13 MED ORDER — MIDAZOLAM HCL 2 MG/2ML IJ SOLN
INTRAMUSCULAR | Status: AC
  Filled 2014-11-13: qty 6

## 2014-11-13 MED ORDER — FENTANYL CITRATE (PF) 100 MCG/2ML IJ SOLN
INTRAMUSCULAR | Status: AC | PRN
  Administered 2014-11-13 (×3): 50 ug via INTRAVENOUS

## 2014-11-13 MED ORDER — MIDAZOLAM HCL 2 MG/2ML IJ SOLN
INTRAMUSCULAR | Status: AC | PRN
  Administered 2014-11-13 (×3): 1 mg via INTRAVENOUS

## 2014-11-13 MED ORDER — FENTANYL CITRATE (PF) 100 MCG/2ML IJ SOLN
INTRAMUSCULAR | Status: AC
  Filled 2014-11-13: qty 4

## 2014-11-13 NOTE — Procedures (Signed)
Technically successful CT guided bone marrow aspiration and biopsy of left iliac crest. No immediate complications.   

## 2014-11-13 NOTE — H&P (Signed)
Chief Complaint: "I'm having a bone marrow biopsy"  Referring Physician(s): Shadad,Firas N  History of Present Illness: James [name] is a 69 y.o. male with history of IgG and IgA monoclonal gammopathy and recent MRI revealing numerous spine /pelvic marrow lesions. He presents today for CT guided bone marrow biopsy.   Past Medical History  Diagnosis Date  . Diabetes mellitus   . Hypertension   . Arthritis   . Thyroid disease     goiter  . HLD (hyperlipidemia)   . Anemia   . Pneumonia 07/2011  . GERD (gastroesophageal reflux disease)   . BPH (benign prostatic hyperplasia)   . Headache(784.0)     migraines, sinus headaches  . Hepatitis     C and B-treated  . Urinary frequency     Past Surgical History  Procedure Laterality Date  . Cholecystectomy    . Abdominal surgery    . Hernia repair    . Colonoscopy    . Back surgery      x 5   . Tonsillectomy    . Hand surgery      right finger  . Sinus surgery    . Rotator cuff repair      bilateral  . Cyst removal skull  20 years ago  . Eye surgery      bilat cataract with lens implants  . Ethmoidectomy  10/12/2011    Procedure: ETHMOIDECTOMY;  Surgeon: Keturah Barre, MD;  Location: [facility] OR;  Service: ENT;  Laterality: Bilateral;  bilateral maxillary sinus osteal enlargement, frontal sinusotomy  . Shoulder arthroscopy with rotator cuff repair Left 05/18/2014    Procedure: SHOULDER ARTHROSCOPY WITH ROTATOR CUFF REPAIR;  Surgeon: Mable Paris, MD;  Location:  [facility];  Service: Orthopedics;  Laterality: Left;  Left shoulder arthroscopy rotator cuff repair, subacromial decompression    Allergies: Invokana  Medications: Prior to Admission medications   Medication Sig Start Date End Date Taking? Authorizing Provider  amLODipine (NORVASC) 10 MG tablet Take 10 mg by mouth every morning.    Yes Historical Provider, MD  azelastine (ASTEPRO) 137 MCG/SPRAY nasal spray Place 1 spray into the nose 2  (two) times daily as needed. For dry nasal passages   Yes Historical Provider, MD  cholecalciferol (VITAMIN D) 1000 UNITS tablet Take 1,000 Units by mouth daily.   Yes Historical Provider, MD  diphenhydrAMINE (BENADRYL) 25 MG tablet Take 1 tablet (25 mg total) by mouth every 6 (six) hours. 08/29/14  Yes Nelva Nay, MD  ferrous sulfate 325 (65 FE) MG tablet Take 325 mg by mouth 2 (two) times daily with a meal.    Yes Historical Provider, MD  gabapentin (NEURONTIN) 300 MG capsule Take 300 mg by mouth 3 (three) times daily.    Yes Historical Provider, MD  glimepiride (AMARYL) 2 MG tablet Take 2 mg by mouth daily before breakfast.    Yes Historical Provider, MD  HYDROcodone-acetaminophen (NORCO) 10-325 MG per tablet Take 1 tablet by mouth every 6 (six) hours as needed for moderate pain.   Yes Historical Provider, MD  losartan (COZAAR) 50 MG tablet Take 50 mg by mouth every morning.    Yes Historical Provider, MD  metFORMIN (GLUCOPHAGE-XR) 500 MG 24 hr tablet Take 1,000 mg by mouth 2 (two) times daily.    Yes Historical Provider, MD  omeprazole (PRILOSEC) 20 MG capsule Take 20 mg by mouth 4 (four) times daily.    Yes Historical Provider, MD  Tamsulosin HCl (FLOMAX) 0.4  MG CAPS Take 0.4 mg by mouth 2 (two) times daily.    Yes Historical Provider, MD  traMADol (ULTRAM) 50 MG tablet Take 50 mg by mouth 4 (four) times daily -  with meals and at bedtime. For pain   Yes Historical Provider, MD  docusate sodium (COLACE) 100 MG capsule Take 1 capsule (100 mg total) by mouth 3 (three) times daily as needed. Patient not taking: Reported on 11/11/2014 05/18/14   Jiles Harold, PA-C  famotidine (PEPCID) 20 MG tablet Take 1 tablet (20 mg total) by mouth 2 (two) times daily. Patient not taking: Reported on 11/11/2014 08/29/14   Nelva Nay, MD  oxyCODONE-acetaminophen (ROXICET) 5-325 MG per tablet Take 1-2 tablets by mouth every 4 (four) hours as needed for severe pain. Patient not taking: Reported on 11/11/2014  05/18/14   Jiles Harold, PA-C    Family History  Problem Relation Age of Onset  . Hyperlipidemia Mother   . Hypertension Mother     History   Social History  . Marital Status: Married    Spouse Name: N/A  . Number of Children: N/A  . Years of Education: N/A   Social History Main Topics  . Smoking status: Former Smoker    Quit date: 05/12/1984  . Smokeless tobacco: Not on file  . Alcohol Use: No  . Drug Use: No  . Sexual Activity: Not on file   Other Topics Concern  . None   Social History Narrative      Review of Systems  Constitutional: Positive for fatigue. Negative for fever and chills.  Respiratory: Negative for cough and shortness of breath.   Cardiovascular: Negative for chest pain.  Gastrointestinal: Positive for abdominal pain. Negative for nausea and blood in stool.  Genitourinary: Negative for dysuria and hematuria.  Musculoskeletal: Positive for back pain.  Neurological: Negative for headaches.  Hematological: Does not bruise/bleed easily.    Vital Signs: BP 137/64 mmHg  Pulse 57  Temp(Src) 98 F (36.7 C) (Oral)  Resp 18  SpO2 98%  Physical Exam  Constitutional: He is oriented to person, place, and time. He appears well-developed and well-nourished.  Cardiovascular: Regular rhythm.   Sl brady but regular  Pulmonary/Chest: Effort normal and breath sounds normal.  Abdominal: Soft. Bowel sounds are normal. There is no tenderness.  Musculoskeletal: Normal range of motion. He exhibits no edema.  Neurological: He is alert and oriented to person, place, and time.    Imaging: No results found.  Labs:  CBC:  Recent Labs  05/18/14 1211 08/29/14 2130 10/29/14 1527 11/13/14 0930  WBC  --   --  5.7 5.1  HGB 13.1 13.9 12.4* 12.4*  HCT  --  41.0 37.2* 37.5*  PLT  --   --  198 205    COAGS:  Recent Labs  11/13/14 0930  INR 1.13  APTT 30    BMP:  Recent Labs  05/13/14 1010 08/29/14 2130 10/29/14 1528  NA 137 139 140  K  4.3 4.2 3.9  CL 100 103  --   CO2 23  --  21*  GLUCOSE 248* 217* 173*  BUN 12 17 12.0  CALCIUM 9.3  --  9.1  CREATININE 1.06 1.20 1.1  GFRNONAA 69*  --   --   GFRAA 80*  --   --     LIVER FUNCTION TESTS:  Recent Labs  10/29/14 1528  BILITOT 0.34  AST 15  ALT 14  ALKPHOS 75  PROT 7.8  ALBUMIN 3.8  TUMOR MARKERS: No results for input(s): AFPTM, CEA, CA199, CHROMGRNA in the last 8760 hours.  Assessment and Plan: James [name] is a 69 y.o. male with history of IgG and IgA monoclonal gammopathy and recent MRI revealing numerous spine /pelvic marrow lesions. He presents today for CT guided bone marrow biopsy. Risks and benefits discussed with the patient/wife including, but not limited to bleeding, infection, damage to adjacent structures or low yield requiring additional tests. All of the patient's questions were answered, patient is agreeable to proceed. Consent signed and in chart.     Signed: Autumn Messing 11/13/2014, 10:19 AM   I spent a total of 20 minutes face to face in clinical consultation, greater than 50% of which was counseling/coordinating care for CT guided bone marrow biopsy

## 2014-11-13 NOTE — Discharge Instructions (Signed)
Bone Marrow Aspiration, Bone Marrow Biopsy °Care After °Read the instructions outlined below and refer to this sheet in the next few weeks. These discharge instructions provide you with general information on caring for yourself after you leave the hospital. Your caregiver may also give you specific instructions. While your treatment has been planned according to the most current medical practices available, unavoidable complications occasionally occur. If you have any problems or questions after discharge, call your caregiver. °FINDING OUT THE RESULTS OF YOUR TEST °Not all test results are available during your visit. If your test results are not back during the visit, make an appointment with your caregiver to find out the results. Do not assume everything is normal if you have not heard from your caregiver or the medical facility. It is important for you to follow up on all of your test results.  °HOME CARE INSTRUCTIONS  °You have had sedation and may be sleepy or dizzy. Your thinking may not be as clear as usual. For the next 24 hours: °· Only take over-the-counter or prescription medicines for pain, discomfort, and or fever as directed by your caregiver. °· Do not drink alcohol. °· Do not smoke. °· Do not drive. °· Do not make important legal decisions. °· Do not operate heavy machinery. °· Do not care for small children by yourself. °· Keep your dressing clean and dry. You may replace dressing with a bandage after 24 hours. °· You may take a bath or shower after 24 hours. °· Use an ice pack for 20 minutes every 2 hours while awake for pain as needed. °SEEK MEDICAL CARE IF:  °· There is redness, swelling, or increasing pain at the biopsy site. °· There is pus coming from the biopsy site. °· There is drainage from a biopsy site lasting longer than one day. °· An unexplained oral temperature above 102° F (38.9° C) develops. °SEEK IMMEDIATE MEDICAL CARE IF:  °· You develop a rash. °· You have difficulty  breathing. °· You develop any reaction or side effects to medications given. °Document Released: 02/03/2005 Document Revised: 10/09/2011 Document Reviewed: 07/14/2008 °ExitCare® Patient Information ©2015 [employer]. This information is not intended to replace advice given to you by your health care provider. Make sure you discuss any questions you have with your health care provider. °Conscious Sedation °Sedation is the use of medicines to promote relaxation and relieve discomfort and anxiety. Conscious sedation is a type of sedation. Under conscious sedation you are less alert than normal but are still able to respond to instructions or stimulation. Conscious sedation is used during short medical and dental procedures. It is milder than deep sedation or general anesthesia and allows you to return to your regular activities sooner.  °LET YOUR HEALTH CARE PROVIDER KNOW ABOUT:  °· Any allergies you have. °· All medicines you are taking, including vitamins, herbs, eye drops, creams, and over-the-counter medicines. °· Use of steroids (by mouth or creams). °· Previous problems you or members of your family have had with the use of anesthetics. °· Any blood disorders you have. °· Previous surgeries you have had. °· Medical conditions you have. °· Possibility of pregnancy, if this applies. °· Use of cigarettes, alcohol, or illegal drugs. °RISKS AND COMPLICATIONS °Generally, this is a safe procedure. However, as with any procedure, problems can occur. Possible problems include: °· Oversedation. °· Trouble breathing on your own. You may need to have a breathing tube until you are awake and breathing on your own. °· Allergic reaction   to any of the medicines used for the procedure. °BEFORE THE PROCEDURE °· You may have blood tests done. These tests can help show how well your kidneys and liver are working. They can also show how well your blood clots. °· A physical exam will be done.   °· Only take medicines as directed by  your health care provider. You may need to stop taking medicines (such as blood thinners, aspirin, or nonsteroidal anti-inflammatory drugs) before the procedure.   °· Do not eat or drink at least 6 hours before the procedure or as directed by your health care provider. °· Arrange for a responsible adult, family member, or friend to take you home after the procedure. He or she should stay with you for at least 24 hours after the procedure, until the medicine has worn off. °PROCEDURE  °· An intravenous (IV) catheter will be inserted into one of your veins. Medicine will be able to flow directly into your body through this catheter. You may be given medicine through this tube to help prevent pain and help you relax. °· The medical or dental procedure will be done. °AFTER THE PROCEDURE °· You will stay in a recovery area until the medicine has worn off. Your blood pressure and pulse will be checked.   °·  Depending on the procedure you had, you may be allowed to go home when you can tolerate liquids and your pain is under control. °Document Released: 04/11/2001 Document Revised: 07/22/2013 Document Reviewed: 03/24/2013 °ExitCare® Patient Information ©2015 [employer]. This information is not intended to replace advice given to you by your health care provider. Make sure you discuss any questions you have with your health care provider. °Conscious Sedation, Adult, Care After °Refer to this sheet in the next few weeks. These instructions provide you with information on caring for yourself after your procedure. Your health care provider may also give you more specific instructions. Your treatment has been planned according to current medical practices, but problems sometimes occur. Call your health care provider if you have any problems or questions after your procedure. °WHAT TO EXPECT AFTER THE PROCEDURE  °After your procedure: °· You may feel sleepy, clumsy, and have poor balance for several hours. °· Vomiting may  occur if you eat too soon after the procedure. °HOME CARE INSTRUCTIONS °· Do not participate in any activities where you could become injured for at least 24 hours. Do not: °¨ Drive. °¨ Swim. °¨ Ride a bicycle. °¨ Operate heavy machinery. °¨ Cook. °¨ Use power tools. °¨ Climb ladders. °¨ Work from a high place. °· Do not make important decisions or sign legal documents until you are improved. °· If you vomit, drink water, juice, or soup when you can drink without vomiting. Make sure you have little or no nausea before eating solid foods. °· Only take over-the-counter or prescription medicines for pain, discomfort, or fever as directed by your health care provider. °· Make sure you and your family fully understand everything about the medicines given to you, including what side effects may occur. °· You should not drink alcohol, take sleeping pills, or take medicines that cause drowsiness for at least 24 hours. °· If you smoke, do not smoke without supervision. °· If you are feeling better, you may resume normal activities 24 hours after you were sedated. °· Keep all appointments with your health care provider. °SEEK MEDICAL CARE IF: °· Your skin is pale or bluish in color. °· You continue to feel nauseous or vomit. °· Your pain   is getting worse and is not helped by medicine. °· You have bleeding or swelling. °· You are still sleepy or feeling clumsy after 24 hours. °SEEK IMMEDIATE MEDICAL CARE IF: °· You develop a rash. °· You have difficulty breathing. °· You develop any type of allergic problem. °· You have a fever. °MAKE SURE YOU: °· Understand these instructions. °· Will watch your condition. °· Will get help right away if you are not doing well or get worse. °Document Released: 05/07/2013 Document Reviewed: 05/07/2013 °ExitCare® Patient Information ©2015 [employer]. This information is not intended to replace advice given to you by your health care provider. Make sure you discuss any questions you have with  your health care provider. ° °

## 2014-11-19 ENCOUNTER — Other Ambulatory Visit: Payer: Self-pay | Admitting: Oncology

## 2014-11-20 ENCOUNTER — Telehealth: Payer: Self-pay | Admitting: *Deleted

## 2014-11-20 NOTE — Telephone Encounter (Signed)
TC from patient regarding results of bone marrow biopsy done on 11/13/14. No results yet. Let patient know he can call early next week. Will let Dr. Alen Blew know he called. Pt verbalized understanding.

## 2014-11-23 ENCOUNTER — Telehealth: Payer: Self-pay | Admitting: Oncology

## 2014-11-23 LAB — TISSUE HYBRIDIZATION (BONE MARROW)-NCBH

## 2014-11-23 LAB — CHROMOSOME ANALYSIS, BONE MARROW

## 2014-11-23 NOTE — Telephone Encounter (Signed)
S/w pt confirming MD visit per 04/21 POF......Marland Kitchen KJ

## 2014-11-25 ENCOUNTER — Ambulatory Visit (HOSPITAL_BASED_OUTPATIENT_CLINIC_OR_DEPARTMENT_OTHER): Payer: Commercial Managed Care - HMO | Admitting: Oncology

## 2014-11-25 ENCOUNTER — Telehealth: Payer: Self-pay | Admitting: Oncology

## 2014-11-25 VITALS — BP 159/68 | HR 93 | Temp 98.2°F | Resp 20 | Ht 72.0 in | Wt 216.5 lb

## 2014-11-25 DIAGNOSIS — D472 Monoclonal gammopathy: Secondary | ICD-10-CM | POA: Diagnosis not present

## 2014-11-25 DIAGNOSIS — D729 Disorder of white blood cells, unspecified: Secondary | ICD-10-CM

## 2014-11-25 NOTE — Telephone Encounter (Signed)
Gave avs & calendar for June. °

## 2014-11-25 NOTE — Progress Notes (Signed)
Hematology and Oncology Follow Up Visit  James [name] [id number] Dec 22, 1945 69 y.o. 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 He has elevated IgG and IgA. Most recently he has a predominantly IgA kappa subtype.  Current therapy: Observation and surveillance.  Interim History:  James [name] returns today for a follow up visit.  Since the last visit, he underwent a bone marrow biopsy and tolerated it well. Continues to have chronic pain in the neck and the hip area that have been reasonably controlled with pain medication. He gets his premedication predominantly from pain clinic where he follows regularly. He has not reported any constitutional symptoms since the last visit. Has not reported any major changes in the quality of his pain.   He is not reporting any abdominal pain or rectal bleeding. No recent hospitalizations or illness. He reports no pathological fractures, no recent infections.  He does not report any headaches, blurry vision, syncope or seizures. He does not report any fevers or chills or sweats. Does not report any cough or hemoptysis or hematemesis. Standpoint nausea or vomiting or abdominal pain. Does not report any frequency urgency or hesitancy. He does not report any lymphadenopathy or petechiae. The remaining review of systems unremarkable.    Medications: I have reviewed the patient's current medications.  Current Outpatient Prescriptions  Medication Sig Dispense Refill  . amLODipine (NORVASC) 10 MG tablet Take 10 mg by mouth every morning.     Marland Kitchen azelastine (ASTEPRO) 137 MCG/SPRAY nasal spray Place 1 spray into the nose 2 (two) times daily as needed. For dry nasal passages    . cholecalciferol (VITAMIN D) 1000 UNITS tablet Take 1,000 Units by mouth daily.    . diphenhydrAMINE (BENADRYL) 25 MG tablet Take 1 tablet (25  mg total) by mouth every 6 (six) hours. 10 tablet 0  . docusate sodium (COLACE) 100 MG capsule Take 1 capsule (100 mg total) by mouth 3 (three) times daily as needed. 20 capsule 0  . famotidine (PEPCID) 20 MG tablet Take 1 tablet (20 mg total) by mouth 2 (two) times daily. 10 tablet 0  . ferrous sulfate 325 (65 FE) MG tablet Take 325 mg by mouth 2 (two) times daily with a meal.     . gabapentin (NEURONTIN) 300 MG capsule Take 300 mg by mouth 3 (three) times daily.     Marland Kitchen glimepiride (AMARYL) 2 MG tablet Take 2 mg by mouth daily before breakfast.     . HYDROcodone-acetaminophen (NORCO) 10-325 MG per tablet Take 1 tablet by mouth every 6 (six) hours as needed for moderate pain.    Marland Kitchen losartan (COZAAR) 50 MG tablet Take 50 mg by mouth every morning.     . metFORMIN (GLUCOPHAGE-XR) 500 MG 24 hr tablet Take 1,000 mg by mouth 2 (two) times daily.     Marland Kitchen omeprazole (PRILOSEC) 20 MG capsule Take 20 mg by mouth 4 (four) times daily.     Marland Kitchen oxyCODONE-acetaminophen (ROXICET) 5-325 MG per tablet Take 1-2 tablets by mouth every 4 (four) hours as needed for severe pain. 60 tablet 0  . Tamsulosin HCl (FLOMAX) 0.4 MG CAPS Take 0.4 mg by mouth 2 (two) times daily.     . traMADol (ULTRAM) 50 MG tablet Take 50 mg by mouth 4 (four) times daily -  with meals and at bedtime. For pain    . [DISCONTINUED] terazosin (HYTRIN)  5 MG capsule Take 5 mg by mouth 2 (two) times daily.      No current facility-administered medications for this visit.    Allergies:  Allergies  Allergen Reactions  . Invokana [Canagliflozin] Anaphylaxis    Facial and lip swelling    Past Medical History, Surgical history, Social history, and Family History were reviewed and updated.   Physical Exam: Blood pressure 159/68, pulse 93, temperature 98.2 F (36.8 C), temperature source Oral, resp. rate 20, height 6' (1.829 m), weight 216 lb 8 oz (98.204 kg), SpO2 99 %. ECOG: 1 General appearance: alert Head: Normocephalic, without obvious  abnormality, atraumatic Neck: no adenopathy Lymph nodes: Cervical, supraclavicular, and axillary nodes normal. Heart:regular rate and rhythm, S1, S2 normal, no murmur, click, rub or gallop Lung:chest clear, no wheezing, rales, normal symmetric air entry, no tachypnea, retractions or cyanosis Abdomin: soft, non-tender, without masses or organomegaly EXT:no erythema, induration, or nodules Neurological: He has no deficits motor and sensory are deep tendon reflexes.  Lab Results: Lab Results  Component Value Date   WBC 5.1 11/13/2014   HGB 12.4* 11/13/2014   HCT 37.5* 11/13/2014   MCV 89.5 11/13/2014   PLT 205 11/13/2014     Chemistry      Component Value Date/Time   NA 140 10/29/2014 1528   NA 139 08/29/2014 2130   K 3.9 10/29/2014 1528   K 4.2 08/29/2014 2130   CL 103 08/29/2014 2130   CL 105 08/09/2012 0918   CO2 21* 10/29/2014 1528   CO2 23 05/13/2014 1010   BUN 12.0 10/29/2014 1528   BUN 17 08/29/2014 2130   CREATININE 1.1 10/29/2014 1528   CREATININE 1.20 08/29/2014 2130      Component Value Date/Time   CALCIUM 9.1 10/29/2014 1528   CALCIUM 9.3 05/13/2014 1010   ALKPHOS 75 10/29/2014 1528   ALKPHOS 58 02/09/2012 0930   AST 15 10/29/2014 1528   AST 37 02/09/2012 0930   ALT 14 10/29/2014 1528   ALT 46 02/09/2012 0930   BILITOT 0.34 10/29/2014 1528   BILITOT 0.3 02/09/2012 0930      Results for LONNY, EISEN (MRN [id number]) as of 11/25/2014 09:31  Ref. Range 07/14/2011 11:02 02/09/2012 09:30 08/09/2012 09:18 05/21/2013 09:43 10/29/2014 15:28  IgA Latest Ref Range: 68-379 mg/dL 730 (H) 749 (H) 753 (H) (Added) 986 (H) 1320 (H)     Impression and Plan:  69 year old with:  1. IgG and IgA Kappa monoclonal gammopathy. He had been on active surveillance previously but failed to show up since October 2014. His M spike on 10/29/2014 was up to 1 g/dL and his IgA level is up to 1320. He did have an MRI of the lumbar spine which showed a bone marrow changes suggestive of a  plasma cell disorder. He underwent a bone marrow biopsy on 11/13/2014 and was discussed with the patient today. He appeared to have increase in his plasma cell percentage up to 15%.  The differential diagnosis of these findings was discussed with the patient. This certainly suspicious for a plasma cell disorder likely a smoldering multiple myeloma given the fact that he has no and organ damage. He has normal hemoglobin, calcium, renal function and slowly increasing IgA level. His chronic bone issues are not related to malignancy rather than arthritis and spinal stenosis. However, he is at high risk of developing active symptomatic multiple myeloma.  I discussed the risks and benefits of initiating systemic chemotherapy now versus later if he develops and organ  damage or symptomatic myeloma. We have elected to defer treatment for the time being and observe him closely. I will repeat protein studies as well as skeletal survey in 3 months and make a decision based on these findings.    2.  Back pain and hip pain: Unrelated to his hematological disorder or the marrow signal abnormalities. I have asked him to continue to follow-up with the pain clinic and his primary care provider.  3.  Follow-up: In 2 months from now.   [facility], MD 4/27/20169:44 AM

## 2014-11-26 DIAGNOSIS — M5416 Radiculopathy, lumbar region: Secondary | ICD-10-CM | POA: Diagnosis not present

## 2014-11-26 DIAGNOSIS — Z79899 Other long term (current) drug therapy: Secondary | ICD-10-CM | POA: Diagnosis not present

## 2014-11-26 DIAGNOSIS — M545 Low back pain: Secondary | ICD-10-CM | POA: Diagnosis not present

## 2014-12-07 DIAGNOSIS — H25813 Combined forms of age-related cataract, bilateral: Secondary | ICD-10-CM | POA: Diagnosis not present

## 2014-12-08 ENCOUNTER — Encounter (HOSPITAL_COMMUNITY): Payer: Self-pay

## 2014-12-16 DIAGNOSIS — E782 Mixed hyperlipidemia: Secondary | ICD-10-CM | POA: Diagnosis not present

## 2014-12-16 DIAGNOSIS — E1165 Type 2 diabetes mellitus with hyperglycemia: Secondary | ICD-10-CM | POA: Diagnosis not present

## 2014-12-22 DIAGNOSIS — E1165 Type 2 diabetes mellitus with hyperglycemia: Secondary | ICD-10-CM | POA: Diagnosis not present

## 2014-12-22 DIAGNOSIS — I10 Essential (primary) hypertension: Secondary | ICD-10-CM | POA: Diagnosis not present

## 2014-12-22 DIAGNOSIS — E1129 Type 2 diabetes mellitus with other diabetic kidney complication: Secondary | ICD-10-CM | POA: Diagnosis not present

## 2014-12-22 DIAGNOSIS — L500 Allergic urticaria: Secondary | ICD-10-CM | POA: Diagnosis not present

## 2014-12-22 DIAGNOSIS — R809 Proteinuria, unspecified: Secondary | ICD-10-CM | POA: Diagnosis not present

## 2014-12-22 DIAGNOSIS — E782 Mixed hyperlipidemia: Secondary | ICD-10-CM | POA: Diagnosis not present

## 2014-12-22 DIAGNOSIS — Z79899 Other long term (current) drug therapy: Secondary | ICD-10-CM | POA: Diagnosis not present

## 2014-12-22 DIAGNOSIS — E1142 Type 2 diabetes mellitus with diabetic polyneuropathy: Secondary | ICD-10-CM | POA: Diagnosis not present

## 2014-12-24 DIAGNOSIS — M545 Low back pain: Secondary | ICD-10-CM | POA: Diagnosis not present

## 2014-12-24 DIAGNOSIS — M5416 Radiculopathy, lumbar region: Secondary | ICD-10-CM | POA: Diagnosis not present

## 2015-01-06 DIAGNOSIS — M5136 Other intervertebral disc degeneration, lumbar region: Secondary | ICD-10-CM | POA: Diagnosis not present

## 2015-01-06 DIAGNOSIS — M545 Low back pain: Secondary | ICD-10-CM | POA: Diagnosis not present

## 2015-01-06 DIAGNOSIS — M5416 Radiculopathy, lumbar region: Secondary | ICD-10-CM | POA: Diagnosis not present

## 2015-01-19 ENCOUNTER — Ambulatory Visit (HOSPITAL_COMMUNITY)
Admission: RE | Admit: 2015-01-19 | Discharge: 2015-01-19 | Disposition: A | Discharge status: Home / Self Care | Payer: Commercial Managed Care - HMO | Source: Ambulatory Visit | Attending: Oncology | Admitting: Oncology

## 2015-01-19 ENCOUNTER — Other Ambulatory Visit (HOSPITAL_BASED_OUTPATIENT_CLINIC_OR_DEPARTMENT_OTHER): Payer: Commercial Managed Care - HMO

## 2015-01-19 DIAGNOSIS — D472 Monoclonal gammopathy: Secondary | ICD-10-CM

## 2015-01-19 DIAGNOSIS — D729 Disorder of white blood cells, unspecified: Secondary | ICD-10-CM

## 2015-01-19 DIAGNOSIS — D7289 Other specified disorders of white blood cells: Secondary | ICD-10-CM | POA: Diagnosis not present

## 2015-01-19 DIAGNOSIS — E8809 Other disorders of plasma-protein metabolism, not elsewhere classified: Secondary | ICD-10-CM | POA: Diagnosis not present

## 2015-01-19 LAB — CBC WITH DIFFERENTIAL/PLATELET
BASO%: 0.4 % (ref 0.0–2.0)
Basophils Absolute: 0 10*3/uL (ref 0.0–0.1)
EOS%: 2.3 % (ref 0.0–7.0)
Eosinophils Absolute: 0.1 10*3/uL (ref 0.0–0.5)
HCT: 36.6 % — ABNORMAL LOW (ref 38.4–49.9)
HGB: 12.2 g/dL — ABNORMAL LOW (ref 13.0–17.1)
LYMPH%: 22.3 % (ref 14.0–49.0)
MCH: 29.9 pg (ref 27.2–33.4)
MCHC: 33.3 g/dL (ref 32.0–36.0)
MCV: 89.8 fL (ref 79.3–98.0)
MONO#: 0.5 10*3/uL (ref 0.1–0.9)
MONO%: 9.2 % (ref 0.0–14.0)
NEUT#: 3.7 10*3/uL (ref 1.5–6.5)
NEUT%: 65.8 % (ref 39.0–75.0)
Platelets: 170 10*3/uL (ref 140–400)
RBC: 4.07 10*6/uL — ABNORMAL LOW (ref 4.20–5.82)
RDW: 14.3 % (ref 11.0–14.6)
WBC: 5.7 10*3/uL (ref 4.0–10.3)
lymph#: 1.3 10*3/uL (ref 0.9–3.3)

## 2015-01-19 LAB — COMPREHENSIVE METABOLIC PANEL (CC13)
ALT: 16 U/L (ref 0–55)
AST: 15 U/L (ref 5–34)
Albumin: 3.6 g/dL (ref 3.5–5.0)
Alkaline Phosphatase: 74 U/L (ref 40–150)
Anion Gap: 8 mEq/L (ref 3–11)
BUN: 16.3 mg/dL (ref 7.0–26.0)
CO2: 26 mEq/L (ref 22–29)
Calcium: 9.4 mg/dL (ref 8.4–10.4)
Chloride: 103 mEq/L (ref 98–109)
Creatinine: 1.2 mg/dL (ref 0.7–1.3)
EGFR: 74 mL/min/{1.73_m2} — ABNORMAL LOW (ref >90)
Glucose: 142 mg/dl — ABNORMAL HIGH (ref 70–140)
Potassium: 4.5 mEq/L (ref 3.5–5.1)
Sodium: 137 mEq/L (ref 136–145)
Total Bilirubin: 0.29 mg/dL (ref 0.20–1.20)
Total Protein: 7.8 g/dL (ref 6.4–8.3)

## 2015-01-20 ENCOUNTER — Other Ambulatory Visit: Payer: Commercial Managed Care - HMO

## 2015-01-21 LAB — SPEP & IFE WITH QIG
Abnormal Protein Band1: 1.6 g/dL
Albumin ELP: 3.7 g/dL — ABNORMAL LOW (ref 3.8–4.8)
Alpha-1-Globulin: 0.3 g/dL (ref 0.2–0.3)
Alpha-2-Globulin: 0.7 g/dL (ref 0.5–0.9)
Beta 2: 2 g/dL — ABNORMAL HIGH (ref 0.2–0.5)
Beta Globulin: 0.5 g/dL (ref 0.4–0.6)
Gamma Globulin: 0.5 g/dL — ABNORMAL LOW (ref 0.8–1.7)
IgA: 2020 mg/dL — ABNORMAL HIGH (ref 68–379)
IgG (Immunoglobin G), Serum: 685 mg/dL (ref 650–1600)
IgM, Serum: 9 mg/dL — ABNORMAL LOW (ref 41–251)
Total Protein, Serum Electrophoresis: 7.7 g/dL (ref 6.1–8.1)

## 2015-01-21 LAB — KAPPA/LAMBDA LIGHT CHAINS
Kappa free light chain: 2.06 mg/dL — ABNORMAL HIGH (ref 0.33–1.94)
Kappa:Lambda Ratio: 2.22 — ABNORMAL HIGH (ref 0.26–1.65)
Lambda Free Lght Chn: 0.93 mg/dL (ref 0.57–2.63)

## 2015-01-27 ENCOUNTER — Ambulatory Visit (HOSPITAL_BASED_OUTPATIENT_CLINIC_OR_DEPARTMENT_OTHER): Payer: Commercial Managed Care - HMO | Admitting: Oncology

## 2015-01-27 ENCOUNTER — Telehealth: Payer: Self-pay | Admitting: Oncology

## 2015-01-27 VITALS — BP 141/63 | HR 87 | Temp 98.1°F | Resp 20 | Ht 72.0 in | Wt 212.5 lb

## 2015-01-27 DIAGNOSIS — D472 Monoclonal gammopathy: Secondary | ICD-10-CM

## 2015-01-27 DIAGNOSIS — D729 Disorder of white blood cells, unspecified: Secondary | ICD-10-CM

## 2015-01-27 NOTE — Telephone Encounter (Signed)
Pt confirmed labs/ov per 06/29 POF, gave pt AVS and Calendar... KJ °

## 2015-01-27 NOTE — Progress Notes (Signed)
Hematology and Oncology Follow Up Visit  James [name] [id number] 1946-07-05 69 y.o. 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 He has elevated IgG and IgA. Most recently he has a predominantly IgA kappa subtype. There is no evidence of end organ damage. A bone marrow  biopsy in April 2016 showed about 14% plasma cell infiltration.  Current therapy: Observation and surveillance.  Interim History:  James [name] returns today for a follow up visit.  Since the last visit, he continues to do relatively well. He does have chronic arthralgias and myalgias predominantly in the hip that has improved since the last visit. He received his third injection in that area which have helped his symptoms. He has not recently reported any new bony pain. He has not reported any pathological fractures. He has not reported any recurrent sinopulmonary infections. He does report some intermittent abdominal discomfort that have not changed dramatically. He is able to eat and keep food down properly.  He does not report any headaches, blurry vision, syncope or seizures. He does not report any fevers or chills or sweats. Does not report any cough or hemoptysis or hematemesis. Standpoint nausea or vomiting or abdominal pain. Does not report any frequency urgency or hesitancy. He does not report any lymphadenopathy or petechiae. The remaining review of systems unremarkable.    Medications: I have reviewed the patient's current medications.  Current Outpatient Prescriptions  Medication Sig Dispense Refill  . amLODipine (NORVASC) 10 MG tablet Take 10 mg by mouth every morning.     Marland Kitchen azelastine (ASTEPRO) 137 MCG/SPRAY nasal spray Place 1 spray into the nose 2 (two) times daily as needed. For dry nasal passages    . cholecalciferol (VITAMIN D) 1000 UNITS tablet Take 1,000  Units by mouth daily.    . famotidine (PEPCID) 20 MG tablet Take 1 tablet (20 mg total) by mouth 2 (two) times daily. 10 tablet 0  . ferrous sulfate 325 (65 FE) MG tablet Take 325 mg by mouth 2 (two) times daily with a meal.     . finasteride (PROSCAR) 5 MG tablet Take 5 mg by mouth daily.    Marland Kitchen gabapentin (NEURONTIN) 300 MG capsule Take 300 mg by mouth 3 (three) times daily.     Marland Kitchen glimepiride (AMARYL) 2 MG tablet Take 2 mg by mouth daily before breakfast.     . HYDROcodone-acetaminophen (NORCO) 10-325 MG per tablet Take 1 tablet by mouth every 6 (six) hours as needed for moderate pain.    Marland Kitchen losartan (COZAAR) 50 MG tablet Take 50 mg by mouth every morning.     . metFORMIN (GLUCOPHAGE-XR) 500 MG 24 hr tablet Take 1,000 mg by mouth 2 (two) times daily.     Marland Kitchen omeprazole (PRILOSEC) 20 MG capsule Take 20 mg by mouth 4 (four) times daily.     Marland Kitchen oxyCODONE-acetaminophen (ROXICET) 5-325 MG per tablet Take 1-2 tablets by mouth every 4 (four) hours as needed for severe pain. 60 tablet 0  . Tamsulosin HCl (FLOMAX) 0.4 MG CAPS Take 0.4 mg by mouth 2 (two) times daily.     . traMADol (ULTRAM) 50 MG tablet Take 50 mg by mouth 4 (four) times daily -  with meals and at bedtime. For pain    . [DISCONTINUED] terazosin (HYTRIN) 5 MG capsule Take 5 mg by mouth 2 (two) times daily.  No current facility-administered medications for this visit.    Allergies:  Allergies  Allergen Reactions  . Invokana [Canagliflozin] Anaphylaxis    Facial and lip swelling    Past Medical History, Surgical history, Social history, and Family History were reviewed and updated.   Physical Exam: Blood pressure 141/63, pulse 87, temperature 98.1 F (36.7 C), temperature source Oral, resp. rate 20, height 6' (1.829 m), weight 212 lb 8 oz (96.389 kg), SpO2 99 %. ECOG: 1 General appearance: alert gentleman not in any distress. Head: Normocephalic, without obvious abnormality Neck: no adenopathy Lymph nodes: Cervical,  supraclavicular, and axillary nodes normal. Heart:regular rate and rhythm, S1, S2 normal, no murmur, click, rub or gallop Lung:chest clear, no wheezing, rales, normal symmetric air entry, no tachypnea, retractions or cyanosis Abdomin: soft, non-tender, without masses or organomegaly EXT:no erythema, induration, or nodules Neurological: He has no deficits motor and sensory are deep tendon reflexes.  Lab Results: Lab Results  Component Value Date   WBC 5.7 01/19/2015   HGB 12.2* 01/19/2015   HCT 36.6* 01/19/2015   MCV 89.8 01/19/2015   PLT 170 01/19/2015     Chemistry      Component Value Date/Time   NA 137 01/19/2015 1222   NA 139 08/29/2014 2130   K 4.5 01/19/2015 1222   K 4.2 08/29/2014 2130   CL 103 08/29/2014 2130   CL 105 08/09/2012 0918   CO2 26 01/19/2015 1222   CO2 23 05/13/2014 1010   BUN 16.3 01/19/2015 1222   BUN 17 08/29/2014 2130   CREATININE 1.2 01/19/2015 1222   CREATININE 1.20 08/29/2014 2130      Component Value Date/Time   CALCIUM 9.4 01/19/2015 1222   CALCIUM 9.3 05/13/2014 1010   ALKPHOS 74 01/19/2015 1222   ALKPHOS 58 02/09/2012 0930   AST 15 01/19/2015 1222   AST 37 02/09/2012 0930   ALT 16 01/19/2015 1222   ALT 46 02/09/2012 0930   BILITOT 0.29 01/19/2015 1222   BILITOT 0.3 02/09/2012 0930      Results for James [name], James [name] (MRN [id number]) as of 01/27/2015 15:17  Ref. Range 10/29/2014 15:28 01/19/2015 12:22  IgG (Immunoglobin G), Serum Latest Ref Range: [phone] mg/dL 805 685  IgA Latest Ref Range: 68-379 mg/dL 1320 (H) 2020 (H)  IgM, Serum Latest Ref Range: 41-251 mg/dL 10 (L) 9 (L)      Impression and Plan:  69 year old with:  1. IgG and IgA Kappa monoclonal gammopathy. He had been on active surveillance previously but failed to show up since October 2014. His M spike on 10/29/2014 was up to 1 g/dL and his IgA level is up to 1320. He did have an MRI of the lumbar spine which showed a bone marrow changes suggestive of a plasma cell disorder.  He underwent a bone marrow biopsy on 11/13/2014 which showed plasma cell percentage up to 15%.  Follow-up protein studies in June 2016 were reviewed today showed his IgA level had increased to 2000 but his M spike have not changed dramatically around 1 g/dL. I see no evidence of end organ damage within normal hemoglobin, electrolytes and kidney function. His skeletal survey done on 01/19/2015 showed no evidence of any lytic bone lesions.  The differential diagnosis of these findings was discussed with the patient again today. 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 He does have constellation of symptoms that are likely unrelated to these findings.  I  discussed the risks and benefits of initiating systemic chemotherapy now versus later if he develops and organ damage or symptomatic myeloma. We have elected to defer treatment for the time being and observe him closely. I will repeat protein studies in 6 months sooner if he develops any problems.    2.  Back pain and hip pain: Seems to have improved after an epidural injection.  3.  Follow-up: In 6 months from now.   [facility], MD 6/29/20163:16 PM

## 2015-01-28 DIAGNOSIS — R109 Unspecified abdominal pain: Secondary | ICD-10-CM | POA: Diagnosis not present

## 2015-01-28 DIAGNOSIS — M62838 Other muscle spasm: Secondary | ICD-10-CM | POA: Diagnosis not present

## 2015-02-08 DIAGNOSIS — J309 Allergic rhinitis, unspecified: Secondary | ICD-10-CM | POA: Diagnosis not present

## 2015-02-08 DIAGNOSIS — J069 Acute upper respiratory infection, unspecified: Secondary | ICD-10-CM | POA: Diagnosis not present

## 2015-02-23 ENCOUNTER — Telehealth: Payer: Self-pay | Admitting: Internal Medicine

## 2015-02-23 NOTE — Telephone Encounter (Signed)
Dr. Carlean Purl reviewed records and declined to accept Pt at this time.  Stated Pt needed to be referred to [facility] or [place] [facility] for [employer] at [place]

## 2015-03-08 DIAGNOSIS — Z79899 Other long term (current) drug therapy: Secondary | ICD-10-CM | POA: Diagnosis not present

## 2015-03-08 DIAGNOSIS — M545 Low back pain: Secondary | ICD-10-CM | POA: Diagnosis not present

## 2015-03-08 DIAGNOSIS — M5416 Radiculopathy, lumbar region: Secondary | ICD-10-CM | POA: Diagnosis not present

## 2015-03-08 DIAGNOSIS — E1140 Type 2 diabetes mellitus with diabetic neuropathy, unspecified: Secondary | ICD-10-CM | POA: Diagnosis not present

## 2015-03-08 DIAGNOSIS — M21371 Foot drop, right foot: Secondary | ICD-10-CM | POA: Diagnosis not present

## 2015-03-08 DIAGNOSIS — M5136 Other intervertebral disc degeneration, lumbar region: Secondary | ICD-10-CM | POA: Diagnosis not present

## 2015-03-10 DIAGNOSIS — Z888 Allergy status to other drugs, medicaments and biological substances status: Secondary | ICD-10-CM | POA: Diagnosis not present

## 2015-03-10 DIAGNOSIS — R1010 Upper abdominal pain, unspecified: Secondary | ICD-10-CM | POA: Diagnosis not present

## 2015-03-10 DIAGNOSIS — Z87891 Personal history of nicotine dependence: Secondary | ICD-10-CM | POA: Diagnosis not present

## 2015-03-18 DIAGNOSIS — N138 Other obstructive and reflux uropathy: Secondary | ICD-10-CM | POA: Diagnosis not present

## 2015-03-18 DIAGNOSIS — N5201 Erectile dysfunction due to arterial insufficiency: Secondary | ICD-10-CM | POA: Diagnosis not present

## 2015-03-18 DIAGNOSIS — R350 Frequency of micturition: Secondary | ICD-10-CM | POA: Diagnosis not present

## 2015-03-18 DIAGNOSIS — N401 Enlarged prostate with lower urinary tract symptoms: Secondary | ICD-10-CM | POA: Diagnosis not present

## 2015-03-23 DIAGNOSIS — E1165 Type 2 diabetes mellitus with hyperglycemia: Secondary | ICD-10-CM | POA: Diagnosis not present

## 2015-03-23 DIAGNOSIS — E782 Mixed hyperlipidemia: Secondary | ICD-10-CM | POA: Diagnosis not present

## 2015-04-09 DIAGNOSIS — E1165 Type 2 diabetes mellitus with hyperglycemia: Secondary | ICD-10-CM | POA: Diagnosis not present

## 2015-04-09 DIAGNOSIS — E1142 Type 2 diabetes mellitus with diabetic polyneuropathy: Secondary | ICD-10-CM | POA: Diagnosis not present

## 2015-04-09 DIAGNOSIS — Z79899 Other long term (current) drug therapy: Secondary | ICD-10-CM | POA: Diagnosis not present

## 2015-04-09 DIAGNOSIS — I10 Essential (primary) hypertension: Secondary | ICD-10-CM | POA: Diagnosis not present

## 2015-04-09 DIAGNOSIS — L500 Allergic urticaria: Secondary | ICD-10-CM | POA: Diagnosis not present

## 2015-04-09 DIAGNOSIS — R809 Proteinuria, unspecified: Secondary | ICD-10-CM | POA: Diagnosis not present

## 2015-04-09 DIAGNOSIS — E782 Mixed hyperlipidemia: Secondary | ICD-10-CM | POA: Diagnosis not present

## 2015-04-09 DIAGNOSIS — E1129 Type 2 diabetes mellitus with other diabetic kidney complication: Secondary | ICD-10-CM | POA: Diagnosis not present

## 2015-04-13 DIAGNOSIS — M5416 Radiculopathy, lumbar region: Secondary | ICD-10-CM | POA: Diagnosis not present

## 2015-04-22 DIAGNOSIS — E119 Type 2 diabetes mellitus without complications: Secondary | ICD-10-CM | POA: Diagnosis not present

## 2015-04-22 DIAGNOSIS — K317 Polyp of stomach and duodenum: Secondary | ICD-10-CM | POA: Diagnosis not present

## 2015-04-22 DIAGNOSIS — D649 Anemia, unspecified: Secondary | ICD-10-CM | POA: Diagnosis not present

## 2015-04-22 DIAGNOSIS — K921 Melena: Secondary | ICD-10-CM | POA: Diagnosis not present

## 2015-04-22 DIAGNOSIS — K7460 Unspecified cirrhosis of liver: Secondary | ICD-10-CM | POA: Diagnosis not present

## 2015-04-22 DIAGNOSIS — R109 Unspecified abdominal pain: Secondary | ICD-10-CM | POA: Diagnosis not present

## 2015-04-22 DIAGNOSIS — Z888 Allergy status to other drugs, medicaments and biological substances status: Secondary | ICD-10-CM | POA: Diagnosis not present

## 2015-04-22 DIAGNOSIS — I10 Essential (primary) hypertension: Secondary | ICD-10-CM | POA: Diagnosis not present

## 2015-04-22 DIAGNOSIS — Z86010 Personal history of colonic polyps: Secondary | ICD-10-CM | POA: Diagnosis not present

## 2015-05-10 DIAGNOSIS — M545 Low back pain: Secondary | ICD-10-CM | POA: Diagnosis not present

## 2015-05-10 DIAGNOSIS — M5136 Other intervertebral disc degeneration, lumbar region: Secondary | ICD-10-CM | POA: Diagnosis not present

## 2015-05-10 DIAGNOSIS — M5416 Radiculopathy, lumbar region: Secondary | ICD-10-CM | POA: Diagnosis not present

## 2015-05-10 DIAGNOSIS — M542 Cervicalgia: Secondary | ICD-10-CM | POA: Diagnosis not present

## 2015-05-10 DIAGNOSIS — Z79899 Other long term (current) drug therapy: Secondary | ICD-10-CM | POA: Diagnosis not present

## 2015-05-10 DIAGNOSIS — M21371 Foot drop, right foot: Secondary | ICD-10-CM | POA: Diagnosis not present

## 2015-06-17 DIAGNOSIS — R05 Cough: Secondary | ICD-10-CM | POA: Diagnosis not present

## 2015-06-17 DIAGNOSIS — J0100 Acute maxillary sinusitis, unspecified: Secondary | ICD-10-CM | POA: Diagnosis not present

## 2015-07-09 DIAGNOSIS — E1165 Type 2 diabetes mellitus with hyperglycemia: Secondary | ICD-10-CM | POA: Diagnosis not present

## 2015-07-09 DIAGNOSIS — E782 Mixed hyperlipidemia: Secondary | ICD-10-CM | POA: Diagnosis not present

## 2015-07-22 DIAGNOSIS — M5416 Radiculopathy, lumbar region: Secondary | ICD-10-CM | POA: Diagnosis not present

## 2015-07-28 ENCOUNTER — Other Ambulatory Visit: Payer: Self-pay | Admitting: Oncology

## 2015-07-28 DIAGNOSIS — D729 Disorder of white blood cells, unspecified: Secondary | ICD-10-CM

## 2015-07-29 ENCOUNTER — Other Ambulatory Visit: Payer: Commercial Managed Care - HMO

## 2015-08-05 ENCOUNTER — Ambulatory Visit (HOSPITAL_BASED_OUTPATIENT_CLINIC_OR_DEPARTMENT_OTHER): Payer: Commercial Managed Care - HMO | Admitting: Oncology

## 2015-08-05 ENCOUNTER — Telehealth: Payer: Self-pay | Admitting: Oncology

## 2015-08-05 ENCOUNTER — Ambulatory Visit: Payer: Commercial Managed Care - HMO | Admitting: Oncology

## 2015-08-05 ENCOUNTER — Ambulatory Visit (HOSPITAL_BASED_OUTPATIENT_CLINIC_OR_DEPARTMENT_OTHER): Payer: Commercial Managed Care - HMO

## 2015-08-05 VITALS — BP 153/58 | HR 81 | Temp 98.2°F | Resp 18 | Wt 215.5 lb

## 2015-08-05 DIAGNOSIS — D729 Disorder of white blood cells, unspecified: Secondary | ICD-10-CM

## 2015-08-05 DIAGNOSIS — M549 Dorsalgia, unspecified: Secondary | ICD-10-CM

## 2015-08-05 DIAGNOSIS — M25559 Pain in unspecified hip: Secondary | ICD-10-CM

## 2015-08-05 DIAGNOSIS — D472 Monoclonal gammopathy: Secondary | ICD-10-CM

## 2015-08-05 LAB — COMPREHENSIVE METABOLIC PANEL
ALT: 9 U/L (ref 0–55)
AST: 10 U/L (ref 5–34)
Albumin: 3.4 g/dL — ABNORMAL LOW (ref 3.5–5.0)
Alkaline Phosphatase: 75 U/L (ref 40–150)
Anion Gap: 12 mEq/L — ABNORMAL HIGH (ref 3–11)
BUN: 21 mg/dL (ref 7.0–26.0)
CO2: 21 mEq/L — ABNORMAL LOW (ref 22–29)
Calcium: 9.1 mg/dL (ref 8.4–10.4)
Chloride: 100 mEq/L (ref 98–109)
Creatinine: 1.3 mg/dL (ref 0.7–1.3)
EGFR: 66 mL/min/{1.73_m2} — ABNORMAL LOW (ref >90)
Glucose: 266 mg/dl — ABNORMAL HIGH (ref 70–140)
Potassium: 4.6 mEq/L (ref 3.5–5.1)
Sodium: 133 mEq/L — ABNORMAL LOW (ref 136–145)
Total Bilirubin: <0.3 mg/dL (ref 0.20–1.20)
Total Protein: 9 g/dL — ABNORMAL HIGH (ref 6.4–8.3)

## 2015-08-05 LAB — CBC WITH DIFFERENTIAL/PLATELET
BASO%: 0.2 % (ref 0.0–2.0)
Basophils Absolute: 0 10*3/uL (ref 0.0–0.1)
EOS%: 0.8 % (ref 0.0–7.0)
Eosinophils Absolute: 0.1 10*3/uL (ref 0.0–0.5)
HCT: 35.8 % — ABNORMAL LOW (ref 38.4–49.9)
HGB: 11.9 g/dL — ABNORMAL LOW (ref 13.0–17.1)
LYMPH%: 21.9 % (ref 14.0–49.0)
MCH: 30.3 pg (ref 27.2–33.4)
MCHC: 33.2 g/dL (ref 32.0–36.0)
MCV: 91.1 fL (ref 79.3–98.0)
MONO#: 0.6 10*3/uL (ref 0.1–0.9)
MONO%: 7.6 % (ref 0.0–14.0)
NEUT#: 5.7 10*3/uL (ref 1.5–6.5)
NEUT%: 69.5 % (ref 39.0–75.0)
Platelets: 225 10*3/uL (ref 140–400)
RBC: 3.93 10*6/uL — ABNORMAL LOW (ref 4.20–5.82)
RDW: 14.5 % (ref 11.0–14.6)
WBC: 8.3 10*3/uL (ref 4.0–10.3)
lymph#: 1.8 10*3/uL (ref 0.9–3.3)

## 2015-08-05 NOTE — Progress Notes (Signed)
Hematology and Oncology Follow Up Visit  James [name] [id number] 01-Jan-1946 70 y.o. 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 He has elevated IgG and IgA. Most recently he has a predominantly IgA kappa subtype. There is no evidence of end organ damage. A bone marrow  biopsy in April 2016 showed about 14% plasma cell infiltration.  Current therapy: Observation and surveillance.  Interim History:  James [name] returns today for a follow up visit.  Since the last visit, he is no recent complaints. He does have intermittent melena which is chronic in nature and have dramatically improved since his severe episodes back in 3 years ago. He does have chronic arthralgias and myalgias predominantly in the hip that has been stable at this time. He received injections in that area which have helped his symptoms.   He has not recently reported any new bony pain. He has not reported any pathological fractures. He has not reported any recurrent sinopulmonary infections. He does report some intermittent abdominal discomfort that have not changed dramatically. He continues to have excellent performance status and activity level without any decline.  He does not report any headaches, blurry vision, syncope or seizures. He does not report any fevers or chills or sweats. Does not report any cough or hemoptysis or hematemesis. Standpoint nausea or vomiting or abdominal pain. Does not report any frequency urgency or hesitancy. He does not report any lymphadenopathy or petechiae. The remaining review of systems unremarkable.    Medications: I have reviewed the patient's current medications.  Current Outpatient Prescriptions  Medication Sig Dispense Refill  . amLODipine (NORVASC) 10 MG tablet Take 10 mg by mouth every morning.     Marland Kitchen azelastine (ASTEPRO) 137 MCG/SPRAY  nasal spray Place 1 spray into the nose 2 (two) times daily as needed. For dry nasal passages    . cholecalciferol (VITAMIN D) 1000 UNITS tablet Take 1,000 Units by mouth daily.    . famotidine (PEPCID) 20 MG tablet Take 1 tablet (20 mg total) by mouth 2 (two) times daily. 10 tablet 0  . ferrous sulfate 325 (65 FE) MG tablet Take 325 mg by mouth 2 (two) times daily with a meal.     . finasteride (PROSCAR) 5 MG tablet Take 5 mg by mouth daily.    Marland Kitchen gabapentin (NEURONTIN) 300 MG capsule Take 300 mg by mouth 3 (three) times daily.     Marland Kitchen glimepiride (AMARYL) 2 MG tablet Take 2 mg by mouth daily before breakfast.     . HYDROcodone-acetaminophen (NORCO) 10-325 MG per tablet Take 1 tablet by mouth every 6 (six) hours as needed for moderate pain.    Marland Kitchen losartan (COZAAR) 50 MG tablet Take 50 mg by mouth every morning.     . metFORMIN (GLUCOPHAGE-XR) 500 MG 24 hr tablet Take 1,000 mg by mouth 2 (two) times daily.     Marland Kitchen omeprazole (PRILOSEC) 20 MG capsule Take 20 mg by mouth 4 (four) times daily.     Marland Kitchen oxyCODONE-acetaminophen (ROXICET) 5-325 MG per tablet Take 1-2 tablets by mouth every 4 (four) hours as needed for severe pain. 60 tablet 0  . Tamsulosin HCl (FLOMAX) 0.4 MG CAPS Take 0.4 mg by mouth 2 (two) times daily.     . traMADol (ULTRAM) 50 MG tablet Take 50 mg by mouth 4 (four) times daily -  with meals and at bedtime. For  pain    . [DISCONTINUED] terazosin (HYTRIN) 5 MG capsule Take 5 mg by mouth 2 (two) times daily.      No current facility-administered medications for this visit.    Allergies:  Allergies  Allergen Reactions  . Invokana [Canagliflozin] Anaphylaxis    Facial and lip swelling    Past Medical History, Surgical history, Social history, and Family History were reviewed and updated.   Physical Exam: Blood pressure 153/58, pulse 81, temperature 98.2 F (36.8 C), temperature source Oral, resp. rate 18, weight 215 lb 8 oz (97.75 kg), SpO2 98 %. ECOG: 1 General appearance: alert  awake without any distress. Head: Normocephalic, without obvious abnormality Neck: no adenopathy Lymph nodes: Cervical, supraclavicular, and axillary nodes normal. Heart:regular rate and rhythm, S1, S2 normal, no murmur, click, rub or gallop Lung:chest clear, no wheezing, rales, normal symmetric air entry. Abdomin: soft, non-tender, without masses or organomegaly no shifting dullness or ascites. EXT:no erythema, induration, or nodules Neurological: No deficits noted.  Lab Results: Lab Results  Component Value Date   WBC 8.3 08/05/2015   HGB 11.9* 08/05/2015   HCT 35.8* 08/05/2015   MCV 91.1 08/05/2015   PLT 225 08/05/2015     Chemistry      Component Value Date/Time   NA 137 01/19/2015 1222   NA 139 08/29/2014 2130   K 4.5 01/19/2015 1222   K 4.2 08/29/2014 2130   CL 103 08/29/2014 2130   CL 105 08/09/2012 0918   CO2 26 01/19/2015 1222   CO2 23 05/13/2014 1010   BUN 16.3 01/19/2015 1222   BUN 17 08/29/2014 2130   CREATININE 1.2 01/19/2015 1222   CREATININE 1.20 08/29/2014 2130      Component Value Date/Time   CALCIUM 9.4 01/19/2015 1222   CALCIUM 9.3 05/13/2014 1010   ALKPHOS 74 01/19/2015 1222   ALKPHOS 58 02/09/2012 0930   AST 15 01/19/2015 1222   AST 37 02/09/2012 0930   ALT 16 01/19/2015 1222   ALT 46 02/09/2012 0930   BILITOT 0.29 01/19/2015 1222   BILITOT 0.3 02/09/2012 0930      Results for James [name] (MRN [id number]) as of 01/27/2015 15:17  Ref. Range 10/29/2014 15:28 01/19/2015 12:22  IgG (Immunoglobin G), Serum Latest Ref Range: [phone] mg/dL 805 685  IgA Latest Ref Range: 68-379 mg/dL 1320 (H) 2020 (H)  IgM, Serum Latest Ref Range: 41-251 mg/dL 10 (L) 9 (L)      Impression and Plan:  69 year old with:  1. IgG and IgA Kappa monoclonal gammopathy. He had been on active surveillance previously but failed to show up since October 2014. His M spike on 10/29/2014 was up to 1 g/dL and his IgA level is up to 1320. He did have an MRI of the lumbar  spine which showed a bone marrow changes suggestive of a plasma cell disorder. He underwent a bone marrow biopsy on 11/13/2014 which showed plasma cell percentage up to 15%.  Follow-up protein studies in June 2016 showed his IgA level had increased to 2000 but his M spike have not changed dramatically around 1 g/dL. I see no evidence of end organ damage within normal hemoglobin, electrolytes and kidney function. His skeletal survey done on 01/19/2015 showed no evidence of any lytic bone lesions.  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 The plan is to continue with very close observation and initiate therapy upon symptomatic progression. I have repeated his  protein studies today and currently pending. His hemoglobin appears adequate and have not change.  2.  Back pain and hip pain: Seems to be stable at this time without any major changes.  3.  Follow-up: In 6 months from now.   Zola Button, MD 1/5/20174:17 PM

## 2015-08-05 NOTE — Telephone Encounter (Signed)
Patient sent back to lab and wife given avs report and appointments for July by [facility].

## 2015-08-09 LAB — SPEP & IFE WITH QIG
Abnormal Protein Band1: 2.4 g/dL
Albumin ELP: 3.5 g/dL — ABNORMAL LOW (ref 3.8–4.8)
Alpha-1-Globulin: 0.2 g/dL (ref 0.2–0.3)
Alpha-2-Globulin: 0.6 g/dL (ref 0.5–0.9)
Beta 2: 2.9 g/dL — ABNORMAL HIGH (ref 0.2–0.5)
Beta Globulin: 0.5 g/dL (ref 0.4–0.6)
Gamma Globulin: 0.5 g/dL — ABNORMAL LOW (ref 0.8–1.7)
IgA: 3100 mg/dL — ABNORMAL HIGH (ref 68–379)
IgG (Immunoglobin G), Serum: 670 mg/dL (ref 650–1600)
IgM, Serum: 9 mg/dL — ABNORMAL LOW (ref 41–251)
Total Protein, Serum Electrophoresis: 8.2 g/dL — ABNORMAL HIGH (ref 6.1–8.1)

## 2015-08-09 LAB — KAPPA/LAMBDA LIGHT CHAINS
Kappa free light chain: 2.16 mg/dL — ABNORMAL HIGH (ref 0.33–1.94)
Kappa:Lambda Ratio: 2.27 — ABNORMAL HIGH (ref 0.26–1.65)
Lambda Free Lght Chn: 0.95 mg/dL (ref 0.57–2.63)

## 2015-08-12 DIAGNOSIS — I480 Paroxysmal atrial fibrillation: Secondary | ICD-10-CM | POA: Diagnosis not present

## 2015-08-12 DIAGNOSIS — R0902 Hypoxemia: Secondary | ICD-10-CM | POA: Diagnosis not present

## 2015-08-12 DIAGNOSIS — R079 Chest pain, unspecified: Secondary | ICD-10-CM | POA: Diagnosis not present

## 2015-08-12 DIAGNOSIS — R0602 Shortness of breath: Secondary | ICD-10-CM | POA: Diagnosis not present

## 2015-08-12 DIAGNOSIS — R7989 Other specified abnormal findings of blood chemistry: Secondary | ICD-10-CM | POA: Diagnosis not present

## 2015-08-12 DIAGNOSIS — I4891 Unspecified atrial fibrillation: Secondary | ICD-10-CM | POA: Diagnosis not present

## 2015-08-14 DIAGNOSIS — Z79899 Other long term (current) drug therapy: Secondary | ICD-10-CM | POA: Diagnosis not present

## 2015-08-14 DIAGNOSIS — I509 Heart failure, unspecified: Secondary | ICD-10-CM | POA: Diagnosis not present

## 2015-08-14 DIAGNOSIS — Z8673 Personal history of transient ischemic attack (TIA), and cerebral infarction without residual deficits: Secondary | ICD-10-CM | POA: Diagnosis not present

## 2015-08-14 DIAGNOSIS — I480 Paroxysmal atrial fibrillation: Secondary | ICD-10-CM | POA: Diagnosis not present

## 2015-08-14 DIAGNOSIS — M1990 Unspecified osteoarthritis, unspecified site: Secondary | ICD-10-CM | POA: Diagnosis present

## 2015-08-14 DIAGNOSIS — I429 Cardiomyopathy, unspecified: Secondary | ICD-10-CM | POA: Diagnosis present

## 2015-08-14 DIAGNOSIS — I5041 Acute combined systolic (congestive) and diastolic (congestive) heart failure: Secondary | ICD-10-CM | POA: Diagnosis not present

## 2015-08-14 DIAGNOSIS — I472 Ventricular tachycardia: Secondary | ICD-10-CM | POA: Diagnosis not present

## 2015-08-14 DIAGNOSIS — I10 Essential (primary) hypertension: Secondary | ICD-10-CM | POA: Diagnosis not present

## 2015-08-14 DIAGNOSIS — R0602 Shortness of breath: Secondary | ICD-10-CM | POA: Diagnosis not present

## 2015-08-14 DIAGNOSIS — M25512 Pain in left shoulder: Secondary | ICD-10-CM | POA: Diagnosis present

## 2015-08-14 DIAGNOSIS — I5021 Acute systolic (congestive) heart failure: Secondary | ICD-10-CM | POA: Diagnosis not present

## 2015-08-14 DIAGNOSIS — E7800 Pure hypercholesterolemia, unspecified: Secondary | ICD-10-CM | POA: Diagnosis not present

## 2015-08-14 DIAGNOSIS — I110 Hypertensive heart disease with heart failure: Secondary | ICD-10-CM | POA: Diagnosis not present

## 2015-08-14 DIAGNOSIS — I712 Thoracic aortic aneurysm, without rupture: Secondary | ICD-10-CM | POA: Diagnosis not present

## 2015-08-14 DIAGNOSIS — Z87891 Personal history of nicotine dependence: Secondary | ICD-10-CM | POA: Diagnosis not present

## 2015-08-14 DIAGNOSIS — R0902 Hypoxemia: Secondary | ICD-10-CM | POA: Diagnosis not present

## 2015-08-14 DIAGNOSIS — I4891 Unspecified atrial fibrillation: Secondary | ICD-10-CM | POA: Diagnosis not present

## 2015-08-14 DIAGNOSIS — R9431 Abnormal electrocardiogram [ECG] [EKG]: Secondary | ICD-10-CM | POA: Diagnosis not present

## 2015-08-14 DIAGNOSIS — I248 Other forms of acute ischemic heart disease: Secondary | ICD-10-CM | POA: Diagnosis not present

## 2015-08-14 DIAGNOSIS — G8929 Other chronic pain: Secondary | ICD-10-CM | POA: Diagnosis present

## 2015-08-14 DIAGNOSIS — I428 Other cardiomyopathies: Secondary | ICD-10-CM | POA: Diagnosis not present

## 2015-08-14 DIAGNOSIS — I481 Persistent atrial fibrillation: Secondary | ICD-10-CM | POA: Diagnosis not present

## 2015-08-14 DIAGNOSIS — M545 Low back pain: Secondary | ICD-10-CM | POA: Diagnosis not present

## 2015-08-20 ENCOUNTER — Encounter: Payer: Self-pay | Admitting: *Deleted

## 2015-08-20 DIAGNOSIS — I4891 Unspecified atrial fibrillation: Secondary | ICD-10-CM | POA: Diagnosis not present

## 2015-08-20 DIAGNOSIS — R7989 Other specified abnormal findings of blood chemistry: Secondary | ICD-10-CM | POA: Diagnosis not present

## 2015-08-20 DIAGNOSIS — I509 Heart failure, unspecified: Secondary | ICD-10-CM | POA: Diagnosis not present

## 2015-08-23 DIAGNOSIS — I712 Thoracic aortic aneurysm, without rupture: Secondary | ICD-10-CM | POA: Diagnosis not present

## 2015-08-23 DIAGNOSIS — I10 Essential (primary) hypertension: Secondary | ICD-10-CM | POA: Diagnosis not present

## 2015-08-23 DIAGNOSIS — I429 Cardiomyopathy, unspecified: Secondary | ICD-10-CM

## 2015-08-23 DIAGNOSIS — I509 Heart failure, unspecified: Secondary | ICD-10-CM | POA: Diagnosis not present

## 2015-08-23 DIAGNOSIS — I4891 Unspecified atrial fibrillation: Secondary | ICD-10-CM | POA: Diagnosis not present

## 2015-08-23 HISTORY — DX: Cardiomyopathy, unspecified: I42.9

## 2015-08-30 DIAGNOSIS — I481 Persistent atrial fibrillation: Secondary | ICD-10-CM | POA: Diagnosis not present

## 2015-08-30 DIAGNOSIS — I712 Thoracic aortic aneurysm, without rupture: Secondary | ICD-10-CM | POA: Diagnosis not present

## 2015-08-30 DIAGNOSIS — I10 Essential (primary) hypertension: Secondary | ICD-10-CM | POA: Diagnosis not present

## 2015-08-30 DIAGNOSIS — I429 Cardiomyopathy, unspecified: Secondary | ICD-10-CM | POA: Diagnosis not present

## 2015-08-30 DIAGNOSIS — I5022 Chronic systolic (congestive) heart failure: Secondary | ICD-10-CM | POA: Diagnosis not present

## 2015-09-01 ENCOUNTER — Emergency Department (HOSPITAL_COMMUNITY)
Admission: EM | Admit: 2015-09-01 | Discharge: 2015-09-01 | Disposition: A | Discharge status: Home / Self Care | Payer: Commercial Managed Care - HMO | Attending: Emergency Medicine | Admitting: Emergency Medicine

## 2015-09-01 ENCOUNTER — Encounter (HOSPITAL_COMMUNITY): Payer: Self-pay | Admitting: Emergency Medicine

## 2015-09-01 ENCOUNTER — Emergency Department (HOSPITAL_COMMUNITY): Payer: Commercial Managed Care - HMO

## 2015-09-01 DIAGNOSIS — R0789 Other chest pain: Secondary | ICD-10-CM | POA: Diagnosis not present

## 2015-09-01 DIAGNOSIS — G43909 Migraine, unspecified, not intractable, without status migrainosus: Secondary | ICD-10-CM | POA: Diagnosis not present

## 2015-09-01 DIAGNOSIS — R079 Chest pain, unspecified: Secondary | ICD-10-CM | POA: Diagnosis present

## 2015-09-01 DIAGNOSIS — Z7984 Long term (current) use of oral hypoglycemic drugs: Secondary | ICD-10-CM | POA: Insufficient documentation

## 2015-09-01 DIAGNOSIS — I10 Essential (primary) hypertension: Secondary | ICD-10-CM | POA: Insufficient documentation

## 2015-09-01 DIAGNOSIS — Z8619 Personal history of other infectious and parasitic diseases: Secondary | ICD-10-CM | POA: Diagnosis not present

## 2015-09-01 DIAGNOSIS — Z8701 Personal history of pneumonia (recurrent): Secondary | ICD-10-CM | POA: Diagnosis not present

## 2015-09-01 DIAGNOSIS — N400 Enlarged prostate without lower urinary tract symptoms: Secondary | ICD-10-CM | POA: Diagnosis not present

## 2015-09-01 DIAGNOSIS — D649 Anemia, unspecified: Secondary | ICD-10-CM | POA: Diagnosis not present

## 2015-09-01 DIAGNOSIS — E785 Hyperlipidemia, unspecified: Secondary | ICD-10-CM | POA: Insufficient documentation

## 2015-09-01 DIAGNOSIS — K219 Gastro-esophageal reflux disease without esophagitis: Secondary | ICD-10-CM | POA: Insufficient documentation

## 2015-09-01 DIAGNOSIS — Z79899 Other long term (current) drug therapy: Secondary | ICD-10-CM | POA: Insufficient documentation

## 2015-09-01 DIAGNOSIS — M1990 Unspecified osteoarthritis, unspecified site: Secondary | ICD-10-CM | POA: Insufficient documentation

## 2015-09-01 DIAGNOSIS — E119 Type 2 diabetes mellitus without complications: Secondary | ICD-10-CM | POA: Diagnosis not present

## 2015-09-01 DIAGNOSIS — Z87891 Personal history of nicotine dependence: Secondary | ICD-10-CM | POA: Insufficient documentation

## 2015-09-01 LAB — CBC
HCT: 34.6 % — ABNORMAL LOW (ref 39.0–52.0)
Hemoglobin: 11.2 g/dL — ABNORMAL LOW (ref 13.0–17.0)
MCH: 29.6 pg (ref 26.0–34.0)
MCHC: 32.4 g/dL (ref 30.0–36.0)
MCV: 91.3 fL (ref 78.0–100.0)
Platelets: 190 10*3/uL (ref 150–400)
RBC: 3.79 MIL/uL — ABNORMAL LOW (ref 4.22–5.81)
RDW: 14.1 % (ref 11.5–15.5)
WBC: 4.4 10*3/uL (ref 4.0–10.5)

## 2015-09-01 LAB — BASIC METABOLIC PANEL
Anion gap: 11 (ref 5–15)
BUN: 15 mg/dL (ref 6–20)
CO2: 21 mmol/L — ABNORMAL LOW (ref 22–32)
Calcium: 9.1 mg/dL (ref 8.9–10.3)
Chloride: 103 mmol/L (ref 101–111)
Creatinine, Ser: 1.28 mg/dL — ABNORMAL HIGH (ref 0.61–1.24)
GFR calc Af Amer: >60 mL/min (ref >60)
GFR calc non Af Amer: 55 mL/min — ABNORMAL LOW (ref >60)
Glucose, Bld: 196 mg/dL — ABNORMAL HIGH (ref 65–99)
Potassium: 4.4 mmol/L (ref 3.5–5.1)
Sodium: 135 mmol/L (ref 135–145)

## 2015-09-01 LAB — I-STAT TROPONIN, ED: Troponin i, poc: 0.01 ng/mL (ref 0.00–0.08)

## 2015-09-01 NOTE — ED Provider Notes (Signed)
CSN: [id number]     Arrival date & time 09/01/15  1430 History   First MD Initiated Contact with Patient 09/01/15 1618     Chief Complaint  Patient presents with  . Chest Pain     (Consider location/radiation/quality/duration/timing/severity/associated sxs/prior Treatment) Patient is a 70 y.o. male presenting with chest pain. The history is provided by the patient.  Chest Pain Pain location:  R lateral chest Pain quality: sharp and stabbing   Pain radiates to:  Does not radiate Pain radiates to the back: no   Pain severity:  Moderate Onset quality:  Sudden Duration:  2 days Timing:  Constant Progression:  Unchanged Chronicity:  New Exacerbated by: movement, palpation, twisting, moving R arm. Ineffective treatments:  None tried Associated symptoms: no abdominal pain, no fever, no headache, no nausea, no palpitations, no shortness of breath and not vomiting    70 yo  With a chief complaint of right-sided chest pain. Patient states this is worse with movement palpation. Initially occurred when he rolled over in bed he felt a sudden pain to the right lateral aspect of his chest. Patient denies recent injury. Patient was recently started a new exercise program read as pushups. States that those don't seem to make it worse but every time he moves his arm or pushes on his chest makes it worse. Denies fevers or chills or shortness of breath. Denies exertional complaints.  Pain isn't radiating anywhere.  Past Medical History  Diagnosis Date  . Diabetes mellitus   . Hypertension   . Arthritis   . Thyroid disease     goiter  . HLD (hyperlipidemia)   . Anemia   . Pneumonia 07/2011  . GERD (gastroesophageal reflux disease)   . BPH (benign prostatic hyperplasia)   . Headache(784.0)     migraines, sinus headaches  . Hepatitis     C and B-treated  . Urinary frequency    Past Surgical History  Procedure Laterality Date  . Cholecystectomy    . Abdominal surgery    . Hernia repair    .  Colonoscopy    . Back surgery      x 5   . Tonsillectomy    . Hand surgery      right finger  . Sinus surgery    . Rotator cuff repair      bilateral  . Cyst removal skull  20 years ago  . Eye surgery      bilat cataract with lens implants  . Ethmoidectomy  10/12/2011    Procedure: ETHMOIDECTOMY;  Surgeon: Thornell Sartorius, MD;  Location: [facility];  Service: ENT;  Laterality: Bilateral;  bilateral maxillary sinus osteal enlargement, frontal sinusotomy  . Shoulder arthroscopy with rotator cuff repair Left 05/18/2014    Procedure: SHOULDER ARTHROSCOPY WITH ROTATOR CUFF REPAIR;  Surgeon: Nita Sells, MD;  Location: [facility];  Service: Orthopedics;  Laterality: Left;  Left shoulder arthroscopy rotator cuff repair, subacromial decompression   Family History  Problem Relation Age of Onset  . Hyperlipidemia Mother   . Hypertension Mother    Social History  Substance Use Topics  . Smoking status: Former Smoker    Quit date: 05/12/1984  . Smokeless tobacco: None  . Alcohol Use: No    Review of Systems  Constitutional: Negative for fever and chills.  HENT: Negative for congestion and facial swelling.   Eyes: Negative for discharge and visual disturbance.  Respiratory: Negative for shortness of breath.   Cardiovascular: Positive for  chest pain. Negative for palpitations.  Gastrointestinal: Negative for nausea, vomiting, abdominal pain and diarrhea.  Musculoskeletal: Negative for myalgias and arthralgias.  Skin: Negative for color change and rash.  Neurological: Negative for tremors, syncope and headaches.  Psychiatric/Behavioral: Negative for confusion and dysphoric mood.      Allergies  Invokana  Home Medications   Prior to Admission medications   Medication Sig Start Date End Date Taking? Authorizing Provider  amLODipine (NORVASC) 10 MG tablet Take 10 mg by mouth every morning.    Yes Historical Provider, MD  atorvastatin (LIPITOR) 40 MG tablet  Take 40 mg by mouth daily. 08/19/15  Yes Historical Provider, MD  ferrous sulfate 325 (65 FE) MG tablet Take 325 mg by mouth 2 (two) times daily with a meal.    Yes Historical Provider, MD  finasteride (PROSCAR) 5 MG tablet Take 5 mg by mouth daily. 12/25/14  Yes Historical Provider, MD  gabapentin (NEURONTIN) 400 MG capsule Take 400 mg by mouth 3 (three) times daily.  08/26/15  Yes Historical Provider, MD  glimepiride (AMARYL) 4 MG tablet Take 4 mg by mouth daily with breakfast.    Yes Historical Provider, MD  HYDROcodone-acetaminophen (NORCO) 10-325 MG per tablet Take 1 tablet by mouth every 6 (six) hours as needed for moderate pain.   Yes Historical Provider, MD  lisinopril (PRINIVIL,ZESTRIL) 20 MG tablet Take 20 mg by mouth daily.    Yes Historical Provider, MD  losartan (COZAAR) 50 MG tablet Take 50 mg by mouth every morning.    Yes Historical Provider, MD  metFORMIN (GLUCOPHAGE-XR) 500 MG 24 hr tablet Take 1,000 mg by mouth 2 (two) times daily.    Yes Historical Provider, MD  omeprazole (PRILOSEC) 20 MG capsule Take 20 mg by mouth 4 (four) times daily.    Yes Historical Provider, MD  Tamsulosin HCl (FLOMAX) 0.4 MG CAPS Take 0.4 mg by mouth 2 (two) times daily.    Yes Historical Provider, MD  traMADol (ULTRAM) 50 MG tablet Take 50 mg by mouth 4 (four) times daily -  with meals and at bedtime. For pain   Yes Historical Provider, MD  azelastine (ASTEPRO) 137 MCG/SPRAY nasal spray Place 1 spray into the nose 2 (two) times daily as needed. For dry nasal passages    Historical Provider, MD  famotidine (PEPCID) 20 MG tablet Take 1 tablet (20 mg total) by mouth 2 (two) times daily. Patient not taking: Reported on 09/01/2015 08/29/14   Leonard Schwartz, MD  oxyCODONE-acetaminophen (ROXICET) 5-325 MG per tablet Take 1-2 tablets by mouth every 4 (four) hours as needed for severe pain. Patient not taking: Reported on 09/01/2015 05/18/14   Grier Mitts, PA-C   BP 167/88 mmHg  Pulse 87  Temp(Src) 98.4 F  (36.9 C) (Oral)  Resp 18  SpO2 98% Physical Exam  Constitutional: He is oriented to person, place, and time. He appears well-developed and well-nourished.  HENT:  Head: Normocephalic and atraumatic.  Eyes: EOM are normal. Pupils are equal, round, and reactive to light.  Neck: Normal range of motion. Neck supple. No JVD present.  Cardiovascular: Normal rate and regular rhythm.  Exam reveals no gallop and no friction rub.   No murmur heard. Pulmonary/Chest: No respiratory distress. He has no wheezes. He exhibits tenderness (R sideded worse with  RUE movement.  ).  Abdominal: He exhibits no distension. There is no rebound and no guarding.  Musculoskeletal: Normal range of motion.  Neurological: He is alert and oriented to person, place, and time.  Skin: No rash noted. No pallor.  Psychiatric: He has a normal mood and affect. His behavior is normal.  Nursing note and vitals reviewed.   ED Course  Procedures (including critical care time) Labs Review Labs Reviewed  BASIC METABOLIC PANEL - Abnormal; Notable for the following:    CO2 21 (*)    Glucose, Bld 196 (*)    Creatinine, Ser 1.28 (*)    GFR calc non Af Amer 55 (*)    All other components within normal limits  CBC - Abnormal; Notable for the following:    RBC 3.79 (*)    Hemoglobin 11.2 (*)    HCT 34.6 (*)    All other components within normal limits  I-STAT TROPOININ, ED    Imaging Review Dg Chest 2 View  09/01/2015  CLINICAL DATA:  Chest tightness with painful inspiration. EXAM: CHEST  2 VIEW COMPARISON:  07/17/2012 FINDINGS: Stable asymmetric elevation right hemidiaphragm. The lungs are clear wiithout focal pneumonia, edema, pneumothorax or pleural effusion. Interstitial markings are diffusely coarsened with chronic features. The cardiopericardial silhouette is within normal limits for size. The visualized bony structures of the thorax are intact. IMPRESSION: Stable.  No acute findings. Electronically Signed   By: Misty Stanley M.D.   On: 09/01/2015 15:10   I have personally reviewed and evaluated these images and lab results as part of my medical decision-making.   EKG Interpretation   Date/Time:  Wednesday September 01 2015 14:39:49 EST Ventricular Rate:  87 PR Interval:  146 QRS Duration: 160 QT Interval:  372 QTC Calculation: 447 R Axis:   10 Text Interpretation:  Sinus rhythm Right bundle branch block No  significant change since last tracing Confirmed by Rhiann Boucher MD, Quillian Quince  [id number]) on 09/01/2015 4:21:22 PM      MDM   Final diagnoses:  Chest wall pain    70 yo M  With a chief complaint of right-sided chest pain. Clinically this appears to be muscular skeletal and corresponds with a new exercise regimen. Patient was ordered through triage and EKG a chest x-ray and a troponin which were all negative. Feel this is extremely unlikely to be cardiac disease. Patient does have a remote history of a right lower extremity DVT denies any new lower extremity swelling no recent immobilization or surgeries recently.  Feel that PE is extremely unlikely. We'll have the patient treat as muscular skeletal. Talk with his PCP as well as his pain management specialist.   I have discussed the diagnosis/risks/treatment options with the patient and family and believe the pt to be eligible for discharge home to follow-up with PCP, pain mgmt. We also discussed returning to the ED immediately if new or worsening sx occur. We discussed the sx which are most concerning (e.g., sudden worsening pain, fever, inability to tolerate by mouth) that necessitate immediate return. Medications administered to the patient during their visit and any new prescriptions provided to the patient are listed below.  Medications given during this visit Medications - No data to display  Discharge Medication List as of 09/01/2015  4:53 PM      The patient appears reasonably screen and/or stabilized for discharge and I doubt any other medical  condition or other [facility] requiring further screening, evaluation, or treatment in the ED at this time prior to discharge.      Deno Etienne, DO 09/01/15 2028

## 2015-09-01 NOTE — Discharge Instructions (Signed)

## 2015-09-01 NOTE — ED Notes (Addendum)
Pt reports chest tightness, worse with motion started 2 days ago. sts painful with inspiration , pain radiates from central chest to right arm pit to back. Pt denies nausea nor dizziness. Hx HTN. Pt also reports productive cough , yellow sputum , x 1 week

## 2015-09-02 DIAGNOSIS — I429 Cardiomyopathy, unspecified: Secondary | ICD-10-CM | POA: Diagnosis not present

## 2015-09-02 DIAGNOSIS — I712 Thoracic aortic aneurysm, without rupture: Secondary | ICD-10-CM | POA: Diagnosis not present

## 2015-09-02 DIAGNOSIS — I481 Persistent atrial fibrillation: Secondary | ICD-10-CM | POA: Diagnosis not present

## 2015-09-02 DIAGNOSIS — I10 Essential (primary) hypertension: Secondary | ICD-10-CM | POA: Diagnosis not present

## 2015-09-02 DIAGNOSIS — I5022 Chronic systolic (congestive) heart failure: Secondary | ICD-10-CM | POA: Diagnosis not present

## 2015-09-06 DIAGNOSIS — I10 Essential (primary) hypertension: Secondary | ICD-10-CM | POA: Diagnosis not present

## 2015-09-06 DIAGNOSIS — G8929 Other chronic pain: Secondary | ICD-10-CM | POA: Diagnosis not present

## 2015-09-06 DIAGNOSIS — R0789 Other chest pain: Secondary | ICD-10-CM | POA: Diagnosis not present

## 2015-09-06 DIAGNOSIS — E782 Mixed hyperlipidemia: Secondary | ICD-10-CM | POA: Diagnosis not present

## 2015-09-06 DIAGNOSIS — E1129 Type 2 diabetes mellitus with other diabetic kidney complication: Secondary | ICD-10-CM | POA: Diagnosis not present

## 2015-09-06 DIAGNOSIS — R809 Proteinuria, unspecified: Secondary | ICD-10-CM | POA: Diagnosis not present

## 2015-09-06 DIAGNOSIS — G629 Polyneuropathy, unspecified: Secondary | ICD-10-CM | POA: Diagnosis not present

## 2015-09-06 DIAGNOSIS — E1149 Type 2 diabetes mellitus with other diabetic neurological complication: Secondary | ICD-10-CM | POA: Diagnosis not present

## 2015-09-06 DIAGNOSIS — K219 Gastro-esophageal reflux disease without esophagitis: Secondary | ICD-10-CM | POA: Diagnosis not present

## 2015-09-07 DIAGNOSIS — I509 Heart failure, unspecified: Secondary | ICD-10-CM | POA: Diagnosis not present

## 2015-09-08 ENCOUNTER — Other Ambulatory Visit: Payer: Self-pay | Admitting: Oncology

## 2015-09-08 DIAGNOSIS — D729 Disorder of white blood cells, unspecified: Secondary | ICD-10-CM

## 2015-09-09 ENCOUNTER — Telehealth: Payer: Self-pay | Admitting: Oncology

## 2015-09-09 NOTE — Telephone Encounter (Signed)
Received message from HIM 1/23 stating referral received for patient with new finding, however he is an existing patient. Message was forwarded same day to Dr.Shadad and his desk nurse to address - HIM was also copied. Checked with desk nurse yesterday for update and per desk nurse - she spoke with Dr. Alen Blew, he is looking into this and if patient needs a sooner appointment Dr. Alen Blew will send a pof.

## 2015-09-11 ENCOUNTER — Telehealth: Payer: Self-pay | Admitting: Oncology

## 2015-09-11 NOTE — Telephone Encounter (Signed)
Spoke with patient re appointments for 3/3 and 3/10.

## 2015-09-13 DIAGNOSIS — H11423 Conjunctival edema, bilateral: Secondary | ICD-10-CM | POA: Diagnosis not present

## 2015-09-13 DIAGNOSIS — E119 Type 2 diabetes mellitus without complications: Secondary | ICD-10-CM | POA: Diagnosis not present

## 2015-09-13 DIAGNOSIS — H52223 Regular astigmatism, bilateral: Secondary | ICD-10-CM | POA: Diagnosis not present

## 2015-09-13 DIAGNOSIS — H524 Presbyopia: Secondary | ICD-10-CM | POA: Diagnosis not present

## 2015-09-13 DIAGNOSIS — H11153 Pinguecula, bilateral: Secondary | ICD-10-CM | POA: Diagnosis not present

## 2015-09-13 DIAGNOSIS — Z9849 Cataract extraction status, unspecified eye: Secondary | ICD-10-CM | POA: Diagnosis not present

## 2015-09-13 DIAGNOSIS — Z961 Presence of intraocular lens: Secondary | ICD-10-CM | POA: Diagnosis not present

## 2015-09-13 DIAGNOSIS — H5202 Hypermetropia, left eye: Secondary | ICD-10-CM | POA: Diagnosis not present

## 2015-09-13 DIAGNOSIS — H35371 Puckering of macula, right eye: Secondary | ICD-10-CM | POA: Diagnosis not present

## 2015-09-16 DIAGNOSIS — I509 Heart failure, unspecified: Secondary | ICD-10-CM | POA: Diagnosis not present

## 2015-09-28 DIAGNOSIS — E119 Type 2 diabetes mellitus without complications: Secondary | ICD-10-CM | POA: Diagnosis not present

## 2015-09-28 DIAGNOSIS — H11153 Pinguecula, bilateral: Secondary | ICD-10-CM | POA: Diagnosis not present

## 2015-09-28 DIAGNOSIS — H0011 Chalazion right upper eyelid: Secondary | ICD-10-CM | POA: Diagnosis not present

## 2015-09-28 DIAGNOSIS — Z961 Presence of intraocular lens: Secondary | ICD-10-CM | POA: Diagnosis not present

## 2015-09-28 DIAGNOSIS — H18413 Arcus senilis, bilateral: Secondary | ICD-10-CM | POA: Diagnosis not present

## 2015-09-28 DIAGNOSIS — Z7984 Long term (current) use of oral hypoglycemic drugs: Secondary | ICD-10-CM | POA: Diagnosis not present

## 2015-10-01 ENCOUNTER — Other Ambulatory Visit (HOSPITAL_BASED_OUTPATIENT_CLINIC_OR_DEPARTMENT_OTHER): Payer: Commercial Managed Care - HMO

## 2015-10-01 DIAGNOSIS — D472 Monoclonal gammopathy: Secondary | ICD-10-CM

## 2015-10-01 DIAGNOSIS — D729 Disorder of white blood cells, unspecified: Secondary | ICD-10-CM | POA: Diagnosis not present

## 2015-10-01 LAB — CBC WITH DIFFERENTIAL/PLATELET
BASO%: 0.4 % (ref 0.0–2.0)
Basophils Absolute: 0 10*3/uL (ref 0.0–0.1)
EOS%: 2.4 % (ref 0.0–7.0)
Eosinophils Absolute: 0.1 10*3/uL (ref 0.0–0.5)
HCT: 33.5 % — ABNORMAL LOW (ref 38.4–49.9)
HGB: 11 g/dL — ABNORMAL LOW (ref 13.0–17.1)
LYMPH%: 34.8 % (ref 14.0–49.0)
MCH: 29.7 pg (ref 27.2–33.4)
MCHC: 32.9 g/dL (ref 32.0–36.0)
MCV: 90.2 fL (ref 79.3–98.0)
MONO#: 0.4 10*3/uL (ref 0.1–0.9)
MONO%: 9.7 % (ref 0.0–14.0)
NEUT#: 2.4 10*3/uL (ref 1.5–6.5)
NEUT%: 52.7 % (ref 39.0–75.0)
Platelets: 181 10*3/uL (ref 140–400)
RBC: 3.71 10*6/uL — ABNORMAL LOW (ref 4.20–5.82)
RDW: 14.6 % (ref 11.0–14.6)
WBC: 4.6 10*3/uL (ref 4.0–10.3)
lymph#: 1.6 10*3/uL (ref 0.9–3.3)

## 2015-10-01 LAB — COMPREHENSIVE METABOLIC PANEL
ALT: <9 U/L (ref 0–55)
AST: 14 U/L (ref 5–34)
Albumin: 3.7 g/dL (ref 3.5–5.0)
Alkaline Phosphatase: 61 U/L (ref 40–150)
Anion Gap: 11 mEq/L (ref 3–11)
BUN: 15.1 mg/dL (ref 7.0–26.0)
CO2: 22 mEq/L (ref 22–29)
Calcium: 9.3 mg/dL (ref 8.4–10.4)
Chloride: 106 mEq/L (ref 98–109)
Creatinine: 1.4 mg/dL — ABNORMAL HIGH (ref 0.7–1.3)
EGFR: 59 mL/min/{1.73_m2} — ABNORMAL LOW (ref >90)
Glucose: 120 mg/dl (ref 70–140)
Potassium: 4.6 mEq/L (ref 3.5–5.1)
Sodium: 139 mEq/L (ref 136–145)
Total Bilirubin: <0.3 mg/dL (ref 0.20–1.20)
Total Protein: 9.4 g/dL — ABNORMAL HIGH (ref 6.4–8.3)

## 2015-10-04 LAB — KAPPA/LAMBDA LIGHT CHAINS
Ig Kappa Free Light Chain: 22.9 mg/L — ABNORMAL HIGH (ref 3.30–19.40)
Ig Lambda Free Light Chain: 11.95 mg/L (ref 5.71–26.30)
Kappa/Lambda FluidC Ratio: 1.92 — ABNORMAL HIGH (ref 0.26–1.65)

## 2015-10-05 LAB — MULTIPLE MYELOMA PANEL, SERUM
Albumin SerPl Elph-Mcnc: 3.9 g/dL (ref 2.9–4.4)
Albumin/Glob SerPl: 0.8 (ref 0.7–1.7)
Alpha 1: 0.1 g/dL (ref 0.0–0.4)
Alpha2 Glob SerPl Elph-Mcnc: 0.7 g/dL (ref 0.4–1.0)
B-Globulin SerPl Elph-Mcnc: 3.5 g/dL — ABNORMAL HIGH (ref 0.7–1.3)
Gamma Glob SerPl Elph-Mcnc: 0.7 g/dL (ref 0.4–1.8)
Globulin, Total: 5 g/dL — ABNORMAL HIGH (ref 2.2–3.9)
IgA, Qn, Serum: 3134 mg/dL — ABNORMAL HIGH (ref 61–437)
IgG, Qn, Serum: 644 mg/dL — ABNORMAL LOW (ref 700–1600)
IgM, Qn, Serum: 7 mg/dL — ABNORMAL LOW (ref 15–143)
M Protein SerPl Elph-Mcnc: 2.4 g/dL — ABNORMAL HIGH
Total Protein: 8.9 g/dL — ABNORMAL HIGH (ref 6.0–8.5)

## 2015-10-08 ENCOUNTER — Ambulatory Visit (HOSPITAL_BASED_OUTPATIENT_CLINIC_OR_DEPARTMENT_OTHER): Payer: Commercial Managed Care - HMO | Admitting: Oncology

## 2015-10-08 ENCOUNTER — Other Ambulatory Visit: Payer: Self-pay | Admitting: Oncology

## 2015-10-08 ENCOUNTER — Telehealth: Payer: Self-pay | Admitting: Oncology

## 2015-10-08 VITALS — BP 134/59 | HR 75 | Temp 98.0°F | Resp 19 | Ht 72.0 in | Wt 213.9 lb

## 2015-10-08 DIAGNOSIS — D472 Monoclonal gammopathy: Secondary | ICD-10-CM

## 2015-10-08 DIAGNOSIS — D729 Disorder of white blood cells, unspecified: Secondary | ICD-10-CM

## 2015-10-08 NOTE — Progress Notes (Signed)
Hematology and Oncology Follow Up Visit  James [name] [id number] November 16, 1945 70 y.o. 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 He has elevated IgG and IgA. Most recently he has a predominantly IgA kappa subtype. There is no evidence of end organ damage. A bone marrow  biopsy in April 2016 showed about 14% plasma cell infiltration. No clear-cut end organ damage have been identified.  Current therapy: Observation and surveillance.  Interim History:  [name] returns today for a follow up visit. Since the last visit, he reports feeling about the same. He continues to have chronic intermittent lower back and pelvic pain which have not changed. He was seen in the emergency department for chest wall pain which have resolved at this time. He continues to be reasonably active without any decline in his performance status or activity level.   He has not recently reported any new bony pain. He has not reported any pathological fractures. He has not reported any recurrent sinopulmonary infections. He does report some intermittent abdominal discomfort that have not changed dramatically.   He does not report any headaches, blurry vision, syncope or seizures. He does not report any fevers or chills or sweats. Does not report any cough or hemoptysis or hematemesis. He does not report any chest pain palpitation or orthopnea. He reports no nausea or vomiting or abdominal pain. Does not report any frequency urgency or hesitancy. He does not report any lymphadenopathy or petechiae. The remaining review of systems unremarkable.    Medications: I have reviewed the patient's current medications.  Current Outpatient Prescriptions  Medication Sig Dispense Refill  . amLODipine (NORVASC) 10 MG tablet Take 10 mg by mouth every morning.     Marland Kitchen atorvastatin (LIPITOR) 40 MG tablet  Take 40 mg by mouth daily.  0  . azelastine (ASTEPRO) 137 MCG/SPRAY nasal spray Place 1 spray into the nose 2 (two) times daily as needed. For dry nasal passages    . famotidine (PEPCID) 20 MG tablet Take 1 tablet (20 mg total) by mouth 2 (two) times daily. (Patient not taking: Reported on 09/01/2015) 10 tablet 0  . ferrous sulfate 325 (65 FE) MG tablet Take 325 mg by mouth 2 (two) times daily with a meal.     . finasteride (PROSCAR) 5 MG tablet Take 5 mg by mouth daily.    Marland Kitchen gabapentin (NEURONTIN) 400 MG capsule Take 400 mg by mouth 3 (three) times daily.     Marland Kitchen glimepiride (AMARYL) 4 MG tablet Take 4 mg by mouth daily with breakfast.     . HYDROcodone-acetaminophen (NORCO) 10-325 MG per tablet Take 1 tablet by mouth every 6 (six) hours as needed for moderate pain.    Marland Kitchen lisinopril (PRINIVIL,ZESTRIL) 20 MG tablet Take 20 mg by mouth daily.     Marland Kitchen losartan (COZAAR) 50 MG tablet Take 50 mg by mouth every morning.     . metFORMIN (GLUCOPHAGE-XR) 500 MG 24 hr tablet Take 1,000 mg by mouth 2 (two) times daily.     Marland Kitchen omeprazole (PRILOSEC) 20 MG capsule Take 20 mg by mouth 4 (four) times daily.     Marland Kitchen oxyCODONE-acetaminophen (ROXICET) 5-325 MG per tablet Take 1-2 tablets by mouth every 4 (four) hours as needed for severe pain. (Patient not taking: Reported on 09/01/2015) 60 tablet 0  . Tamsulosin HCl (FLOMAX) 0.4 MG CAPS Take 0.4 mg by mouth  2 (two) times daily.     . traMADol (ULTRAM) 50 MG tablet Take 50 mg by mouth 4 (four) times daily -  with meals and at bedtime. For pain    . [DISCONTINUED] terazosin (HYTRIN) 5 MG capsule Take 5 mg by mouth 2 (two) times daily.      No current facility-administered medications for this visit.    Allergies:  Allergies  Allergen Reactions  . Invokana [Canagliflozin] Anaphylaxis    Facial and lip swelling    Past Medical History, Surgical history, Social history, and Family History were reviewed and updated.   Physical Exam: Blood pressure 134/59, pulse 75,  temperature 98 F (36.7 C), temperature source Oral, resp. rate 19, height 6' (1.829 m), weight 213 lb 14.4 oz (97.024 kg), SpO2 99 %. ECOG: 1 General appearance: alert awake Well-appearing gentleman without distress. Head: Normocephalic, without obvious abnormality Neck: no adenopathy Lymph nodes: Cervical, supraclavicular, and axillary nodes normal. Heart:regular rate and rhythm, S1, S2 normal, no murmur, click, rub or gallop Lung:chest clear, no wheezing, rales, normal symmetric air entry. Abdomin: soft, non-tender, without masses or organomegaly no rebound or guarding. EXT:no erythema, induration, or nodules Neurological: No deficits noted.  Lab Results: Lab Results  Component Value Date   WBC 4.6 10/01/2015   HGB 11.0* 10/01/2015   HCT 33.5* 10/01/2015   MCV 90.2 10/01/2015   PLT 181 10/01/2015     Chemistry      Component Value Date/Time   NA 139 10/01/2015 1337   NA 135 09/01/2015 1503   K 4.6 10/01/2015 1337   K 4.4 09/01/2015 1503   CL 103 09/01/2015 1503   CL 105 08/09/2012 0918   CO2 22 10/01/2015 1337   CO2 21* 09/01/2015 1503   BUN 15.1 10/01/2015 1337   BUN 15 09/01/2015 1503   CREATININE 1.4* 10/01/2015 1337   CREATININE 1.28* 09/01/2015 1503      Component Value Date/Time   CALCIUM 9.3 10/01/2015 1337   CALCIUM 9.1 09/01/2015 1503   ALKPHOS 61 10/01/2015 1337   ALKPHOS 58 02/09/2012 0930   AST 14 10/01/2015 1337   AST 37 02/09/2012 0930   ALT <9 10/01/2015 1337   ALT 46 02/09/2012 0930   BILITOT <0.30 10/01/2015 1337   BILITOT 0.3 02/09/2012 0930      Results for CHESNEY, KLIMASZEWSKI (MRN [id number]) as of 10/08/2015 15:25  Ref. Range 10/29/2014 15:28 01/19/2015 12:22 08/05/2015 16:01 10/01/2015 13:37  IgG (Immunoglobin G), Serum Latest Ref Range: 518-121-3730 mg/dL [phone] (L)    Results for DARRYLL, RAJU (MRN [id number]) as of 10/08/2015 15:25  Ref. Range 10/01/2015 13:37  Ig Kappa Free Light Chain Latest Ref Range: 3.30-19.40 mg/L 22.90 (H)  Ig Lambda  Free Light Chain Latest Ref Range: 5.71-26.30 mg/L 11.95  Kappa/Lambda FluidC Ratio Latest Ref Range: 0.26-1.65  1.92 (H)   Results for JOON, POHLE (MRN [id number]) as of 10/08/2015 15:25  Ref. Range 10/01/2015 13:37  IgA/Immunoglobulin A, Serum Latest Ref Range: 61-437 mg/dL 3,134 (H)   Results for FISHEL, WAMBLE (MRN [id number]) as of 10/08/2015 15:25  Ref. Range 10/29/2014 15:28 01/19/2015 12:22 08/05/2015 16:01  IgA Latest Ref Range: 68-379 mg/dL 1320 (H) 2020 (H) 3100 (H)   Results for EVERT, WENRICH (MRN [id number]) as of 10/08/2015 15:25  Ref. Range 08/05/2015 16:01  Total Protein, Serum Electrophoresis Latest Ref Range: 6.1-8.1 g/dL 8.2 (H)  Kappa free light chain Latest Ref Range: 0.33-1.94 mg/dL 2.16 (H)  Lambda Free Lght Chn  Latest Ref Range: 0.57-2.63 mg/dL 0.95  Kappa:Lambda Ratio Latest Ref Range: 0.26-1.65  2.27 (H)    Impression and Plan:  70 year old with:  1. IgA Kappa monoclonal gammopathy. 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 He had been on active surveillance since 2012. He underwent a bone marrow biopsy on 11/13/2014 which showed plasma cell percentage up to 14%. Skeletal survey failed to show any bone lesions in June 2016.  His most recent protein studies obtained on 10/01/2015 were reviewed today and not dramatically changed from January 2017. His IgG level is around 3100 and his M spike continues to be around 2 g/dL. He has no clear-cut end organ damage. His anemia is mild and his creatinine is around 1.4 with a creatinine clearance of around 70 mL/m. He is asymptomatic from these findings.  The natural course of smoldering myeloma was discussed again with the patient. He is at high risk of involving into active myeloma and possibly will require treatment in the immediate future. Treatment for active myeloma will include multiple agents. He would be a good candidate for a regimen involving Velcade, Cytoxan and dexamethasone. The logistics of administration of  chemotherapy was discussed today. After discussing the risks and benefits we have opted to continue observation and surveillance given the fact that he is asymptomatic and there is no clear cut end organ damage.  He understands if he develops worsening renal insufficiency, worsening anemia or bone lesions in active treatment will commence.  The plan is to repeat protein studies and skeletal survey in 3 months and assess him at that time.  2.  Back pain and hip pain: He has been evaluated extensively with multiple imaging studies that failed to show myeloma involvement of the bone. He will have repeat imaging studies including a skeletal survey to continue to ensure these findings.  3.  Follow-up: In 3 months.   Zola Button, MD 3/10/20174:26 PM

## 2015-10-08 NOTE — Telephone Encounter (Signed)
Gave and printed appt shced and avs for pt for June °

## 2015-10-22 DIAGNOSIS — M21371 Foot drop, right foot: Secondary | ICD-10-CM | POA: Diagnosis not present

## 2015-10-22 DIAGNOSIS — M5442 Lumbago with sciatica, left side: Secondary | ICD-10-CM | POA: Diagnosis not present

## 2015-10-22 DIAGNOSIS — E1142 Type 2 diabetes mellitus with diabetic polyneuropathy: Secondary | ICD-10-CM | POA: Diagnosis not present

## 2015-10-22 DIAGNOSIS — M7710 Lateral epicondylitis, unspecified elbow: Secondary | ICD-10-CM | POA: Diagnosis not present

## 2015-10-22 DIAGNOSIS — M5136 Other intervertebral disc degeneration, lumbar region: Secondary | ICD-10-CM | POA: Diagnosis not present

## 2015-10-22 DIAGNOSIS — J029 Acute pharyngitis, unspecified: Secondary | ICD-10-CM | POA: Diagnosis not present

## 2015-10-22 DIAGNOSIS — M5416 Radiculopathy, lumbar region: Secondary | ICD-10-CM | POA: Diagnosis not present

## 2015-10-22 DIAGNOSIS — G8929 Other chronic pain: Secondary | ICD-10-CM | POA: Diagnosis not present

## 2015-10-22 DIAGNOSIS — M5441 Lumbago with sciatica, right side: Secondary | ICD-10-CM | POA: Diagnosis not present

## 2015-11-01 DIAGNOSIS — J018 Other acute sinusitis: Secondary | ICD-10-CM | POA: Diagnosis not present

## 2015-11-16 ENCOUNTER — Ambulatory Visit
Admission: RE | Admit: 2015-11-16 | Discharge: 2015-11-16 | Disposition: A | Discharge status: Home / Self Care | Payer: Commercial Managed Care - HMO | Source: Ambulatory Visit | Attending: Family Medicine | Admitting: Family Medicine

## 2015-11-16 ENCOUNTER — Other Ambulatory Visit: Payer: Self-pay | Admitting: Family Medicine

## 2015-11-16 DIAGNOSIS — R0789 Other chest pain: Secondary | ICD-10-CM | POA: Diagnosis not present

## 2015-11-16 DIAGNOSIS — R52 Pain, unspecified: Secondary | ICD-10-CM

## 2015-11-16 DIAGNOSIS — S2232XD Fracture of one rib, left side, subsequent encounter for fracture with routine healing: Secondary | ICD-10-CM | POA: Diagnosis not present

## 2015-11-19 DIAGNOSIS — M5442 Lumbago with sciatica, left side: Secondary | ICD-10-CM | POA: Diagnosis not present

## 2015-11-19 DIAGNOSIS — E1142 Type 2 diabetes mellitus with diabetic polyneuropathy: Secondary | ICD-10-CM | POA: Diagnosis not present

## 2015-11-19 DIAGNOSIS — M542 Cervicalgia: Secondary | ICD-10-CM | POA: Diagnosis not present

## 2015-11-19 DIAGNOSIS — M21371 Foot drop, right foot: Secondary | ICD-10-CM | POA: Diagnosis not present

## 2015-11-19 DIAGNOSIS — M5416 Radiculopathy, lumbar region: Secondary | ICD-10-CM | POA: Diagnosis not present

## 2015-11-19 DIAGNOSIS — G8929 Other chronic pain: Secondary | ICD-10-CM | POA: Diagnosis not present

## 2015-11-19 DIAGNOSIS — M5136 Other intervertebral disc degeneration, lumbar region: Secondary | ICD-10-CM | POA: Diagnosis not present

## 2015-11-19 DIAGNOSIS — M5441 Lumbago with sciatica, right side: Secondary | ICD-10-CM | POA: Diagnosis not present

## 2015-12-08 DIAGNOSIS — I5020 Unspecified systolic (congestive) heart failure: Secondary | ICD-10-CM | POA: Diagnosis not present

## 2015-12-08 DIAGNOSIS — I481 Persistent atrial fibrillation: Secondary | ICD-10-CM | POA: Diagnosis not present

## 2015-12-08 DIAGNOSIS — I429 Cardiomyopathy, unspecified: Secondary | ICD-10-CM | POA: Diagnosis not present

## 2015-12-08 DIAGNOSIS — R0602 Shortness of breath: Secondary | ICD-10-CM | POA: Diagnosis not present

## 2015-12-08 DIAGNOSIS — R079 Chest pain, unspecified: Secondary | ICD-10-CM | POA: Diagnosis not present

## 2015-12-08 DIAGNOSIS — I712 Thoracic aortic aneurysm, without rupture: Secondary | ICD-10-CM | POA: Diagnosis not present

## 2015-12-08 DIAGNOSIS — I10 Essential (primary) hypertension: Secondary | ICD-10-CM | POA: Diagnosis not present

## 2015-12-09 DIAGNOSIS — H524 Presbyopia: Secondary | ICD-10-CM | POA: Diagnosis not present

## 2015-12-09 DIAGNOSIS — H25813 Combined forms of age-related cataract, bilateral: Secondary | ICD-10-CM | POA: Diagnosis not present

## 2015-12-10 DIAGNOSIS — M5442 Lumbago with sciatica, left side: Secondary | ICD-10-CM | POA: Diagnosis not present

## 2015-12-10 DIAGNOSIS — G8929 Other chronic pain: Secondary | ICD-10-CM | POA: Diagnosis not present

## 2015-12-10 DIAGNOSIS — M5441 Lumbago with sciatica, right side: Secondary | ICD-10-CM | POA: Diagnosis not present

## 2015-12-13 DIAGNOSIS — Z79899 Other long term (current) drug therapy: Secondary | ICD-10-CM | POA: Diagnosis not present

## 2015-12-13 DIAGNOSIS — I429 Cardiomyopathy, unspecified: Secondary | ICD-10-CM | POA: Diagnosis not present

## 2015-12-13 DIAGNOSIS — Z87891 Personal history of nicotine dependence: Secondary | ICD-10-CM | POA: Diagnosis not present

## 2015-12-13 DIAGNOSIS — I4891 Unspecified atrial fibrillation: Secondary | ICD-10-CM | POA: Diagnosis not present

## 2015-12-13 DIAGNOSIS — I712 Thoracic aortic aneurysm, without rupture: Secondary | ICD-10-CM | POA: Diagnosis not present

## 2015-12-13 DIAGNOSIS — I5040 Unspecified combined systolic (congestive) and diastolic (congestive) heart failure: Secondary | ICD-10-CM | POA: Diagnosis not present

## 2015-12-13 DIAGNOSIS — I2510 Atherosclerotic heart disease of native coronary artery without angina pectoris: Secondary | ICD-10-CM | POA: Diagnosis not present

## 2015-12-13 DIAGNOSIS — I110 Hypertensive heart disease with heart failure: Secondary | ICD-10-CM | POA: Diagnosis not present

## 2015-12-22 DIAGNOSIS — J0100 Acute maxillary sinusitis, unspecified: Secondary | ICD-10-CM | POA: Diagnosis not present

## 2015-12-31 DIAGNOSIS — N183 Chronic kidney disease, stage 3 unspecified: Secondary | ICD-10-CM | POA: Insufficient documentation

## 2015-12-31 DIAGNOSIS — I482 Chronic atrial fibrillation: Secondary | ICD-10-CM | POA: Diagnosis not present

## 2015-12-31 DIAGNOSIS — I10 Essential (primary) hypertension: Secondary | ICD-10-CM | POA: Diagnosis not present

## 2015-12-31 DIAGNOSIS — I5020 Unspecified systolic (congestive) heart failure: Secondary | ICD-10-CM | POA: Diagnosis not present

## 2015-12-31 DIAGNOSIS — I429 Cardiomyopathy, unspecified: Secondary | ICD-10-CM | POA: Diagnosis not present

## 2015-12-31 DIAGNOSIS — I712 Thoracic aortic aneurysm, without rupture: Secondary | ICD-10-CM | POA: Diagnosis not present

## 2015-12-31 HISTORY — DX: Chronic kidney disease, stage 3 unspecified: N18.30

## 2016-01-07 ENCOUNTER — Other Ambulatory Visit (HOSPITAL_BASED_OUTPATIENT_CLINIC_OR_DEPARTMENT_OTHER): Payer: Commercial Managed Care - HMO

## 2016-01-07 DIAGNOSIS — I482 Chronic atrial fibrillation: Secondary | ICD-10-CM | POA: Diagnosis not present

## 2016-01-07 DIAGNOSIS — D472 Monoclonal gammopathy: Secondary | ICD-10-CM | POA: Diagnosis not present

## 2016-01-07 DIAGNOSIS — D729 Disorder of white blood cells, unspecified: Secondary | ICD-10-CM

## 2016-01-07 LAB — CBC WITH DIFFERENTIAL/PLATELET
BASO%: 0.3 % (ref 0.0–2.0)
Basophils Absolute: 0 10*3/uL (ref 0.0–0.1)
EOS%: 3.1 % (ref 0.0–7.0)
Eosinophils Absolute: 0.1 10*3/uL (ref 0.0–0.5)
HCT: 32.2 % — ABNORMAL LOW (ref 38.4–49.9)
HGB: 10.6 g/dL — ABNORMAL LOW (ref 13.0–17.1)
LYMPH%: 29.5 % (ref 14.0–49.0)
MCH: 29.2 pg (ref 27.2–33.4)
MCHC: 32.8 g/dL (ref 32.0–36.0)
MCV: 89 fL (ref 79.3–98.0)
MONO#: 0.4 10*3/uL (ref 0.1–0.9)
MONO%: 8.9 % (ref 0.0–14.0)
NEUT#: 2.7 10*3/uL (ref 1.5–6.5)
NEUT%: 58.2 % (ref 39.0–75.0)
Platelets: 152 10*3/uL (ref 140–400)
RBC: 3.62 10*6/uL — ABNORMAL LOW (ref 4.20–5.82)
RDW: 15.5 % — ABNORMAL HIGH (ref 11.0–14.6)
WBC: 4.6 10*3/uL (ref 4.0–10.3)
lymph#: 1.3 10*3/uL (ref 0.9–3.3)

## 2016-01-07 LAB — COMPREHENSIVE METABOLIC PANEL
ALT: 12 U/L (ref 0–55)
AST: 18 U/L (ref 5–34)
Albumin: 3.1 g/dL — ABNORMAL LOW (ref 3.5–5.0)
Alkaline Phosphatase: 68 U/L (ref 40–150)
Anion Gap: 11 mEq/L (ref 3–11)
BUN: 11.6 mg/dL (ref 7.0–26.0)
CO2: 24 mEq/L (ref 22–29)
Calcium: 9.2 mg/dL (ref 8.4–10.4)
Chloride: 104 mEq/L (ref 98–109)
Creatinine: 1 mg/dL (ref 0.7–1.3)
EGFR: 86 mL/min/{1.73_m2} — ABNORMAL LOW (ref >90)
Glucose: 83 mg/dl (ref 70–140)
Potassium: 3.8 mEq/L (ref 3.5–5.1)
Sodium: 138 mEq/L (ref 136–145)
Total Bilirubin: <0.3 mg/dL (ref 0.20–1.20)
Total Protein: 9.6 g/dL — ABNORMAL HIGH (ref 6.4–8.3)

## 2016-01-10 DIAGNOSIS — I509 Heart failure, unspecified: Secondary | ICD-10-CM | POA: Diagnosis not present

## 2016-01-10 DIAGNOSIS — I429 Cardiomyopathy, unspecified: Secondary | ICD-10-CM | POA: Diagnosis not present

## 2016-01-10 DIAGNOSIS — I10 Essential (primary) hypertension: Secondary | ICD-10-CM | POA: Diagnosis not present

## 2016-01-10 DIAGNOSIS — I4891 Unspecified atrial fibrillation: Secondary | ICD-10-CM | POA: Diagnosis not present

## 2016-01-10 LAB — KAPPA/LAMBDA LIGHT CHAINS
Ig Kappa Free Light Chain: 23.6 mg/L — ABNORMAL HIGH (ref 3.3–19.4)
Ig Lambda Free Light Chain: 10 mg/L (ref 5.7–26.3)
Kappa/Lambda FluidC Ratio: 2.36 — ABNORMAL HIGH (ref 0.26–1.65)

## 2016-01-11 ENCOUNTER — Ambulatory Visit (HOSPITAL_COMMUNITY)
Admission: RE | Admit: 2016-01-11 | Discharge: 2016-01-11 | Disposition: A | Discharge status: Home / Self Care | Payer: Commercial Managed Care - HMO | Source: Ambulatory Visit | Attending: Oncology | Admitting: Oncology

## 2016-01-11 ENCOUNTER — Ambulatory Visit (HOSPITAL_COMMUNITY): Payer: Commercial Managed Care - HMO

## 2016-01-11 DIAGNOSIS — D729 Disorder of white blood cells, unspecified: Secondary | ICD-10-CM | POA: Diagnosis not present

## 2016-01-11 DIAGNOSIS — R938 Abnormal findings on diagnostic imaging of other specified body structures: Secondary | ICD-10-CM | POA: Diagnosis not present

## 2016-01-11 DIAGNOSIS — M4856XA Collapsed vertebra, not elsewhere classified, lumbar region, initial encounter for fracture: Secondary | ICD-10-CM | POA: Insufficient documentation

## 2016-01-11 DIAGNOSIS — C9000 Multiple myeloma not having achieved remission: Secondary | ICD-10-CM | POA: Diagnosis not present

## 2016-01-11 LAB — MULTIPLE MYELOMA PANEL, SERUM
Albumin SerPl Elph-Mcnc: 3.6 g/dL (ref 2.9–4.4)
Albumin/Glob SerPl: 0.7 (ref 0.7–1.7)
Alpha 1: 0.2 g/dL (ref 0.0–0.4)
Alpha2 Glob SerPl Elph-Mcnc: 0.7 g/dL (ref 0.4–1.0)
B-Globulin SerPl Elph-Mcnc: 3.9 g/dL — ABNORMAL HIGH (ref 0.7–1.3)
Gamma Glob SerPl Elph-Mcnc: 0.5 g/dL (ref 0.4–1.8)
Globulin, Total: 5.3 g/dL — ABNORMAL HIGH (ref 2.2–3.9)
IgA, Qn, Serum: 3721 mg/dL — ABNORMAL HIGH (ref 61–437)
IgG, Qn, Serum: 480 mg/dL — ABNORMAL LOW (ref 700–1600)
IgM, Qn, Serum: 6 mg/dL — ABNORMAL LOW (ref 15–143)
M Protein SerPl Elph-Mcnc: 3 g/dL — ABNORMAL HIGH
Total Protein: 8.9 g/dL — ABNORMAL HIGH (ref 6.0–8.5)

## 2016-01-14 ENCOUNTER — Telehealth: Payer: Self-pay | Admitting: Oncology

## 2016-01-14 ENCOUNTER — Other Ambulatory Visit: Payer: Self-pay | Admitting: *Deleted

## 2016-01-14 ENCOUNTER — Ambulatory Visit (HOSPITAL_BASED_OUTPATIENT_CLINIC_OR_DEPARTMENT_OTHER): Payer: Commercial Managed Care - HMO | Admitting: Oncology

## 2016-01-14 VITALS — BP 134/72 | HR 89 | Temp 99.1°F | Resp 18 | Ht 72.0 in | Wt 205.2 lb

## 2016-01-14 DIAGNOSIS — C9000 Multiple myeloma not having achieved remission: Secondary | ICD-10-CM | POA: Insufficient documentation

## 2016-01-14 DIAGNOSIS — G893 Neoplasm related pain (acute) (chronic): Secondary | ICD-10-CM | POA: Diagnosis not present

## 2016-01-14 MED ORDER — LENALIDOMIDE 25 MG PO CAPS: 25.0000 mg | ORAL_CAPSULE | Freq: Every day | ORAL | Status: DC

## 2016-01-14 MED ORDER — DEXAMETHASONE 4 MG PO TABS: ORAL_TABLET | ORAL | Status: DC

## 2016-01-14 NOTE — Telephone Encounter (Signed)
Enrolled patient in [employer] online. Patient verbalized understanding of drug. Patient received his copy of paperwork. Faxed prescription to [facility].

## 2016-01-14 NOTE — Progress Notes (Signed)
Hematology and Oncology Follow Up Visit  RAYNER ERMAN [id number] Nov 29, 1945 70 y.o. 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 He has elevated IgG and IgA. Most recently he has a predominantly IgA kappa subtype. Bone marrow  biopsy in April 2016 showed about 14% plasma cell infiltration. No clear-cut end organ damage have been identified.  Current therapy: Observation and surveillance.  Interim History:  Mr. Alred returns today for a follow up visit. Since the last visit, he reports no major changes in his health. 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 He continues to be evaluated at the pain clinic periodically. He has not recently reported any new bony pain. He has not reported any pathological fractures. He has not reported any recurrent sinopulmonary infections. His appetite remains reasonable without any changes in his performance status.  He continues to have issues with peripheral neuropathy in his lower extremities as well as in his hands. This has interfered with his function previously. His diabetes remains under reasonable control with oral medications only.  He does not report any headaches, blurry vision, syncope or seizures. He does not report any fevers or chills or sweats. Does not report any cough or hemoptysis or hematemesis. He does not report any chest pain palpitation or orthopnea. He reports no nausea or vomiting or abdominal pain. Does not report any frequency urgency or hesitancy. He does not report any lymphadenopathy or petechiae. The remaining review of systems unremarkable.    Medications: I have reviewed the patient's current medications.  Current Outpatient Prescriptions  Medication Sig Dispense Refill  . amLODipine (NORVASC) 10 MG tablet Take 10 mg by  mouth every morning.     . famotidine (PEPCID) 20 MG tablet Take 1 tablet (20 mg total) by mouth 2 (two) times daily. 10 tablet 0  . ferrous sulfate 325 (65 FE) MG tablet Take 325 mg by mouth 2 (two) times daily with a meal.     . finasteride (PROSCAR) 5 MG tablet Take 5 mg by mouth daily.    Marland Kitchen gabapentin (NEURONTIN) 400 MG capsule Take 400 mg by mouth 3 (three) times daily.     Marland Kitchen glimepiride (AMARYL) 2 MG tablet Take 2 mg by mouth daily.    Marland Kitchen losartan (COZAAR) 50 MG tablet Take 50 mg by mouth every morning.     . metFORMIN (GLUCOPHAGE-XR) 500 MG 24 hr tablet Take 1,000 mg by mouth 2 (two) times daily.     Marland Kitchen omeprazole (PRILOSEC) 20 MG capsule Take 20 mg by mouth 4 (four) times daily.     . Tamsulosin HCl (FLOMAX) 0.4 MG CAPS Take 0.4 mg by mouth 2 (two) times daily.     . traMADol (ULTRAM) 50 MG tablet Take 50 mg by mouth 4 (four) times daily -  with meals and at bedtime. For pain    . dexamethasone (DECADRON) 4 MG tablet Take 5 tablets weekly. 40 tablet 3  . lenalidomide (REVLIMID) 25 MG capsule Take 1 capsule (25 mg total) by mouth daily. 21 capsule 0  . [DISCONTINUED] terazosin (HYTRIN) 5 MG capsule Take 5 mg by mouth 2 (two) times daily.      No current facility-administered medications for this visit.    Allergies:  Allergies  Allergen Reactions  . Invokana [Canagliflozin] Anaphylaxis    Facial and lip  swelling    Past Medical History, Surgical history, Social history, and Family History were reviewed and updated.   Physical Exam: Blood pressure 134/72, pulse 89, temperature 99.1 F (37.3 C), temperature source Oral, resp. rate 18, height 6' (1.829 m), weight 205 lb 3.2 oz (93.078 kg), SpO2 98 %. ECOG: 1 General appearance: Alert, awake gentleman without distress. Head: Normocephalic, without obvious abnormality no oral ulcers or lesions. Neck: no adenopathy Lymph nodes: Cervical, supraclavicular, and axillary nodes normal. Heart:regular rate and rhythm, S1, S2 normal, no  murmur, click, rub or gallop Lung:chest clear, no wheezing, rales, normal symmetric air entry. Abdomin: soft, non-tender, without masses or organomegaly no shifting dullness or ascites. EXT:no erythema, induration, or nodules Neurological: No deficits noted.  Lab Results: Lab Results  Component Value Date   WBC 4.6 01/07/2016   HGB 10.6* 01/07/2016   HCT 32.2* 01/07/2016   MCV 89.0 01/07/2016   PLT 152 01/07/2016     Chemistry      Component Value Date/Time   NA 138 01/07/2016 1025   NA 135 09/01/2015 1503   K 3.8 01/07/2016 1025   K 4.4 09/01/2015 1503   CL 103 09/01/2015 1503   CL 105 08/09/2012 0918   CO2 24 01/07/2016 1025   CO2 21* 09/01/2015 1503   BUN 11.6 01/07/2016 1025   BUN 15 09/01/2015 1503   CREATININE 1.0 01/07/2016 1025   CREATININE 1.28* 09/01/2015 1503      Component Value Date/Time   CALCIUM 9.2 01/07/2016 1025   CALCIUM 9.1 09/01/2015 1503   ALKPHOS 68 01/07/2016 1025   ALKPHOS 58 02/09/2012 0930   AST 18 01/07/2016 1025   AST 37 02/09/2012 0930   ALT 12 01/07/2016 1025   ALT 46 02/09/2012 0930   BILITOT <0.30 01/07/2016 1025   BILITOT 0.3 02/09/2012 0930      EXAM: METASTATIC BONE SURVEY  COMPARISON: 01/19/2015.  FINDINGS: Imaging of the axial and appendicular skeleton performed. Diffuse degenerative changes are present about the cervical, thoracic, and lumbar spines. Mild L2 compression is noted. This is new. A a small lucency is noted the proximal right femur. A a small lucency is noted in the proximal right humerus. Lucencies are noted in both scapula. Old right proximal fibular fracture again noted. Left base pleural parenchymal scarring again noted. Peripheral vascular disease noted.  IMPRESSION: 1. New mild L2 compression fracture.  2. New small lucencies noted in the proximal right femur, proximal right humerus, and both scapula. These findings would be consistent with myeloma.  Results for MARSEL, GAIL (MRN  [id number]) as of 01/14/2016 10:53  Ref. Range 10/01/2015 13:37 01/07/2016 10:25  M Protein SerPl Elph-Mcnc Latest Ref Range: Not Observed g/dL 2.4 (H) 3.0 (H)     Impression and Plan:  70 year old with:  1. IgA Kappa monoclonal gammopathy. 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 He had been on active surveillance since 2012. He underwent a bone marrow biopsy on 11/13/2014 which showed plasma cell percentage up to 14%. Skeletal survey failed to show any bone lesions in June 2016.  His most recent protein studies obtained on 01/07/2016 were reviewed today and showed continuous increase in his protein studies. His M spike is up to 3 g/dL and increase in his kappa free light chains. His skeletal survey was also reviewed and showed small lucencies consistent with bone disease.  These findings were discussed with the patient and his wife today and consistent with progressive multiple myeloma. It is difficult to determine whether these  findings are symptomatic. His chronic back pain issues but it is reasonable to conclude that his myeloma is progressive and requires treatment. Treatment options were reviewed today and would like to avoid Velcade based regimens given his neuropathy. He would be a reasonable candidate to start Revlimid. He is a marginal transplant candidate and will start with doublet therapy with Revlimid and dexamethasone as an initial therapy. Complications associated with this medication include nausea, fatigue, tiredness, cytopenias, hyperglycemia and renal insufficiency. The goal of therapy is to decrease the progression of disease and potentially put him in the long-term remission.  After discussion today he is agreeable to proceed with Revlimid at 25 mg daily 3 weeks on 1 week off. We will start with dexamethasone at 20 mg weekly because of his diabetes. We will assess his protein studies in one month.  2.  Back pain and hip pain: Related to multiple myeloma rather than degenerative  disc disease. He is followed at the pain clinic.  3. Bone health: I discussed with him the role of Zometa for bony protective purposes. Risks and benefits of this medication were reviewed and he declined this at this time. We'll continue to address that with your future visits.  4.  Follow-up: In 1 month to assess his response to therapy.   [facility], MD 6/16/201711:26 AM

## 2016-01-14 NOTE — Telephone Encounter (Signed)
Gave pt apt & avs °

## 2016-01-18 DIAGNOSIS — M5137 Other intervertebral disc degeneration, lumbosacral region: Secondary | ICD-10-CM | POA: Diagnosis not present

## 2016-01-18 DIAGNOSIS — G8929 Other chronic pain: Secondary | ICD-10-CM | POA: Diagnosis not present

## 2016-01-18 DIAGNOSIS — M5126 Other intervertebral disc displacement, lumbar region: Secondary | ICD-10-CM | POA: Diagnosis not present

## 2016-01-18 DIAGNOSIS — M5441 Lumbago with sciatica, right side: Secondary | ICD-10-CM | POA: Diagnosis not present

## 2016-01-18 DIAGNOSIS — M5442 Lumbago with sciatica, left side: Secondary | ICD-10-CM | POA: Diagnosis not present

## 2016-01-18 DIAGNOSIS — M5136 Other intervertebral disc degeneration, lumbar region: Secondary | ICD-10-CM | POA: Diagnosis not present

## 2016-01-18 DIAGNOSIS — Z79899 Other long term (current) drug therapy: Secondary | ICD-10-CM | POA: Diagnosis not present

## 2016-01-18 DIAGNOSIS — M5416 Radiculopathy, lumbar region: Secondary | ICD-10-CM | POA: Diagnosis not present

## 2016-01-18 DIAGNOSIS — E1142 Type 2 diabetes mellitus with diabetic polyneuropathy: Secondary | ICD-10-CM | POA: Diagnosis not present

## 2016-01-19 ENCOUNTER — Telehealth: Payer: Self-pay | Admitting: *Deleted

## 2016-01-19 NOTE — Telephone Encounter (Signed)
Received prior auth request from [facility] for enoxaparin.  Placed in [place] In box.

## 2016-01-20 ENCOUNTER — Encounter: Payer: Self-pay | Admitting: Oncology

## 2016-01-20 NOTE — Progress Notes (Signed)
I sent [employer] notice for referral for revlimid to medical records.

## 2016-01-21 ENCOUNTER — Telehealth: Payer: Self-pay | Admitting: Pharmacist

## 2016-01-21 ENCOUNTER — Encounter: Payer: Self-pay | Admitting: *Deleted

## 2016-01-21 NOTE — Telephone Encounter (Signed)
I contacted [facility] (ph# [phone]) and they are still processing the Rx and "will be in touch with the patient shortly." Kennith Center, Pharm.D., CPP 01/21/2016@2 :[facility]

## 2016-01-24 ENCOUNTER — Telehealth: Payer: Self-pay | Admitting: Pharmacist

## 2016-01-24 NOTE — Telephone Encounter (Signed)
01/24/16: New Rx for Revlimid required prior authorization, submitted to [facility] on 01/24/16, reference [id number]

## 2016-01-24 NOTE — Telephone Encounter (Signed)
Ignore previous message regarding enoxaparin-This was on another pt.

## 2016-01-27 ENCOUNTER — Telehealth: Payer: Self-pay | Admitting: Pharmacist

## 2016-01-27 NOTE — Telephone Encounter (Signed)
Fax recv'd late 6/28 from [employer] - pt approved for grant from Multiple Myeloma- Medicare Access fund for $10,000 (12/27/15 - 12/25/16).  Attached to that fax was a pharmacy card to use for co-payments.  Co-payment for pts Revlimid is $0 Member ID# [id number]; Grp# [id number]; Bin# [id number]; PCN# [id number]; Processor: PDMI Multiple Myeloma - Medicare Access contact: ph# [phone]  I have spoken with [facility] (Ph# [phone]) and they are aware that pt has this assistance.    I s/w pt by phone this AM and he is aware that he was approved for assistance.  He stated he has talked w/ [employer] and they will deliver his Revlimid to his house by [employer] tomorrow (6/30) from 8:30 am - 12 pm.  [employer] counseled pt on Revlimid and he had no questions for me this AM re: tx.  Estimated start date = 01/28/16  We will contact pt in 2 weeks to f/u tolerance, compliance, adverse effects, etc.  , Pharm.D., CPP 01/27/2016@9:54 AM [facility] 

## 2016-01-31 ENCOUNTER — Telehealth: Payer: Self-pay | Admitting: *Deleted

## 2016-01-31 NOTE — Telephone Encounter (Signed)
Patient called and stated,"I haven't started the Decadron or the Revlimid yet because I'm having so much pain and can hardly walk. The pain clinic is adjusting my pain mediations. They started me on Hydrocodone and gave me a patch. I'm not at home to tell you the name or strength of either medication. Is it OK to start the Revlimid and Decadron?"  Instructed the patient to start taking the Decadron and Revlimid today. Instructed patient to call if he had any further questions or concerns. Patient and wife verbalized understanding.

## 2016-02-02 ENCOUNTER — Telehealth: Payer: Self-pay | Admitting: *Deleted

## 2016-02-02 ENCOUNTER — Encounter: Payer: Self-pay | Admitting: *Deleted

## 2016-02-02 NOTE — Telephone Encounter (Signed)
Patient left VM requesting a return call. Call returned got VM. Left message to call me

## 2016-02-02 NOTE — Telephone Encounter (Signed)
Patient calling to say he is in a lot of pain. States he took his pain patch off yesterday, because he thought it was causing his pain, in his back. 1 tablet of hydrocodone is not helping. Patient instructed to put on another patch in a different area and take 2 hydrocodone tablets now. If pain is not resolved, he is to go to the [facility]. States he will run out of pain medication, he is to  contact his pain clinic for refills. No fever, able to urinate and no bowel problems. Note to dr Hazeline Junker desk.

## 2016-02-03 ENCOUNTER — Other Ambulatory Visit: Payer: Commercial Managed Care - HMO

## 2016-02-03 ENCOUNTER — Ambulatory Visit: Payer: Commercial Managed Care - HMO | Admitting: Oncology

## 2016-02-03 DIAGNOSIS — M5136 Other intervertebral disc degeneration, lumbar region: Secondary | ICD-10-CM | POA: Diagnosis not present

## 2016-02-03 DIAGNOSIS — G8929 Other chronic pain: Secondary | ICD-10-CM | POA: Diagnosis not present

## 2016-02-03 DIAGNOSIS — E1142 Type 2 diabetes mellitus with diabetic polyneuropathy: Secondary | ICD-10-CM | POA: Diagnosis not present

## 2016-02-03 DIAGNOSIS — M5441 Lumbago with sciatica, right side: Secondary | ICD-10-CM | POA: Diagnosis not present

## 2016-02-03 DIAGNOSIS — M5442 Lumbago with sciatica, left side: Secondary | ICD-10-CM | POA: Diagnosis not present

## 2016-02-03 DIAGNOSIS — M5416 Radiculopathy, lumbar region: Secondary | ICD-10-CM | POA: Diagnosis not present

## 2016-02-11 ENCOUNTER — Telehealth: Payer: Self-pay | Admitting: Pharmacist

## 2016-02-11 NOTE — Telephone Encounter (Signed)
Attempted to s/w patient for follow up on oral medication: Revlimid. He was busy and could not talk. I told him we would call another day.  Thank you, Kennith Center, Pharm.D., CPP 02/11/2016@4 :08 PM  [facility]

## 2016-02-14 ENCOUNTER — Telehealth: Payer: Self-pay | Admitting: *Deleted

## 2016-02-14 NOTE — Telephone Encounter (Signed)
Patient has questions concerning his medications, asked him to bring all of his bottles with him to appt this week and we will go over medications.

## 2016-02-15 ENCOUNTER — Other Ambulatory Visit: Payer: Self-pay | Admitting: *Deleted

## 2016-02-15 DIAGNOSIS — C9000 Multiple myeloma not having achieved remission: Secondary | ICD-10-CM

## 2016-02-15 MED ORDER — LENALIDOMIDE 25 MG PO CAPS: 25.0000 mg | ORAL_CAPSULE | Freq: Every day | ORAL | Status: DC

## 2016-02-15 NOTE — Telephone Encounter (Signed)
Script for revlimid faxed to [place]

## 2016-02-17 ENCOUNTER — Other Ambulatory Visit (HOSPITAL_BASED_OUTPATIENT_CLINIC_OR_DEPARTMENT_OTHER): Payer: Commercial Managed Care - HMO

## 2016-02-17 ENCOUNTER — Ambulatory Visit (HOSPITAL_BASED_OUTPATIENT_CLINIC_OR_DEPARTMENT_OTHER): Payer: Commercial Managed Care - HMO | Admitting: Oncology

## 2016-02-17 ENCOUNTER — Telehealth: Payer: Self-pay | Admitting: Oncology

## 2016-02-17 VITALS — BP 169/71 | HR 110 | Temp 97.9°F | Resp 19 | Ht 72.0 in | Wt 190.6 lb

## 2016-02-17 DIAGNOSIS — N183 Chronic kidney disease, stage 3 (moderate): Secondary | ICD-10-CM | POA: Diagnosis not present

## 2016-02-17 DIAGNOSIS — I481 Persistent atrial fibrillation: Secondary | ICD-10-CM | POA: Diagnosis not present

## 2016-02-17 DIAGNOSIS — I10 Essential (primary) hypertension: Secondary | ICD-10-CM | POA: Diagnosis not present

## 2016-02-17 DIAGNOSIS — I5020 Unspecified systolic (congestive) heart failure: Secondary | ICD-10-CM | POA: Diagnosis not present

## 2016-02-17 DIAGNOSIS — C9000 Multiple myeloma not having achieved remission: Secondary | ICD-10-CM

## 2016-02-17 DIAGNOSIS — G893 Neoplasm related pain (acute) (chronic): Secondary | ICD-10-CM | POA: Diagnosis not present

## 2016-02-17 LAB — CBC WITH DIFFERENTIAL/PLATELET
BASO%: 0.4 % (ref 0.0–2.0)
Basophils Absolute: 0 10*3/uL (ref 0.0–0.1)
EOS%: 6 % (ref 0.0–7.0)
Eosinophils Absolute: 0.3 10*3/uL (ref 0.0–0.5)
HCT: 32.6 % — ABNORMAL LOW (ref 38.4–49.9)
HGB: 11 g/dL — ABNORMAL LOW (ref 13.0–17.1)
LYMPH%: 25.7 % (ref 14.0–49.0)
MCH: 29.4 pg (ref 27.2–33.4)
MCHC: 33.7 g/dL (ref 32.0–36.0)
MCV: 87.2 fL (ref 79.3–98.0)
MONO#: 0.6 10*3/uL (ref 0.1–0.9)
MONO%: 10.7 % (ref 0.0–14.0)
NEUT#: 3.1 10*3/uL (ref 1.5–6.5)
NEUT%: 57.2 % (ref 39.0–75.0)
Platelets: 193 10*3/uL (ref 140–400)
RBC: 3.74 10*6/uL — ABNORMAL LOW (ref 4.20–5.82)
RDW: 14.7 % — ABNORMAL HIGH (ref 11.0–14.6)
WBC: 5.5 10*3/uL (ref 4.0–10.3)
lymph#: 1.4 10*3/uL (ref 0.9–3.3)

## 2016-02-17 LAB — COMPREHENSIVE METABOLIC PANEL
ALT: 10 U/L (ref 0–55)
AST: 10 U/L (ref 5–34)
Albumin: 3.1 g/dL — ABNORMAL LOW (ref 3.5–5.0)
Alkaline Phosphatase: 85 U/L (ref 40–150)
Anion Gap: 12 mEq/L — ABNORMAL HIGH (ref 3–11)
BUN: 19.1 mg/dL (ref 7.0–26.0)
CO2: 26 mEq/L (ref 22–29)
Calcium: 9.9 mg/dL (ref 8.4–10.4)
Chloride: 101 mEq/L (ref 98–109)
Creatinine: 1.2 mg/dL (ref 0.7–1.3)
EGFR: 72 mL/min/{1.73_m2} — ABNORMAL LOW (ref >90)
Glucose: 151 mg/dl — ABNORMAL HIGH (ref 70–140)
Potassium: 3.8 mEq/L (ref 3.5–5.1)
Sodium: 138 mEq/L (ref 136–145)
Total Bilirubin: 0.32 mg/dL (ref 0.20–1.20)
Total Protein: 8.3 g/dL (ref 6.4–8.3)

## 2016-02-17 NOTE — Progress Notes (Signed)
Hematology and Oncology Follow Up Visit  James [name] [id number] March 31, 1946 70 y.o. 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 Active myeloma diagnosed in June 2017 with an M spike of 3 g/dL and an IgA level of 3700. He was found to have mild anemia and bone marrow involvement. Bone marrow biopsy in April 2016 showed plasma cell myeloma with percentage ranging between 14-30%. Skeletal survey in June 2017 showed no bone lesions  Current therapy: Revlimid and dexamethasone started in July 2017. He is currently receiving Revlimid at 25 mg daily for 21 days with dexamethasone at 20 mg weekly.  Interim History:  James [name] returns today for a follow up visit. Since the last visit, he started Revlimid and dexamethasone without major complications. He did report some issues with food taste and lost some weight. He denied any nausea or vomiting. Denied any diarrhea or dermatological toxicities.    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 He continues to be evaluated at the pain clinic periodically.  He has not reported any recurrent sinopulmonary infections. His mobility has been limited because of diffuse bone pain although he uses a cane for ambulation without any recent falls or syncope.   He does not report any headaches, blurry vision, syncope or seizures. He does not report any fevers or chills or sweats. Does not report any cough or hemoptysis or hematemesis. He does not report any chest pain palpitation or orthopnea. He reports no nausea or vomiting or abdominal pain. Does not report any frequency urgency or hesitancy. He does not report any lymphadenopathy or petechiae. The remaining review of systems unremarkable.    Medications: I have reviewed the patient's current medications.  Current Outpatient Prescriptions   Medication Sig Dispense Refill  . amLODipine (NORVASC) 10 MG tablet Take 10 mg by mouth every morning.     Marland Kitchen dexamethasone (DECADRON) 4 MG tablet Take 5 tablets weekly. 40 tablet 3  . famotidine (PEPCID) 20 MG tablet Take 1 tablet (20 mg total) by mouth 2 (two) times daily. 10 tablet 0  . fentaNYL (DURAGESIC - DOSED MCG/HR) 50 MCG/HR Place onto the skin.    . ferrous sulfate 325 (65 FE) MG tablet Take 325 mg by mouth 2 (two) times daily with a meal.     . finasteride (PROSCAR) 5 MG tablet Take 5 mg by mouth daily.    Marland Kitchen gabapentin (NEURONTIN) 400 MG capsule Take 400 mg by mouth 3 (three) times daily.     Marland Kitchen glimepiride (AMARYL) 2 MG tablet Take 2 mg by mouth daily.    Derrill Memo ON 02/19/2016] HYDROcodone-acetaminophen (NORCO) 10-325 MG tablet Take by mouth.    . lenalidomide (REVLIMID) 25 MG capsule Take 1 capsule (25 mg total) by mouth daily. 21 capsule 0  . losartan (COZAAR) 50 MG tablet Take 50 mg by mouth every morning.     . metFORMIN (GLUCOPHAGE-XR) 500 MG 24 hr tablet Take 1,000 mg by mouth 2 (two) times daily.     Marland Kitchen omeprazole (PRILOSEC) 20 MG capsule Take 20 mg by mouth 4 (four) times daily.     . Tamsulosin HCl (FLOMAX) 0.4 MG CAPS Take 0.4 mg by mouth 2 (two) times daily.     . traMADol (ULTRAM) 50 MG tablet Take 50 mg by mouth 4 (four) times daily -  with meals and at bedtime.  For pain    . [DISCONTINUED] terazosin (HYTRIN) 5 MG capsule Take 5 mg by mouth 2 (two) times daily.      No current facility-administered medications for this visit.    Allergies:  Allergies  Allergen Reactions  . Invokana [Canagliflozin] Anaphylaxis    Facial and lip swelling    Past Medical History, Surgical history, Social history, and Family History were reviewed and updated.   Physical Exam: Blood pressure 169/71, pulse 110, temperature 97.9 F (36.6 C), temperature source Oral, resp. rate 19, height 6' (1.829 m), weight 190 lb 9.6 oz (86.456 kg), SpO2 100 %. ECOG: 1 General appearance:  Chronically ill-appearing gentleman without distress. Head: Normocephalic, without obvious abnormality no oral thrush noted. Neck: no adenopathy Lymph nodes: Cervical, supraclavicular, and axillary nodes normal. Heart:regular rate and rhythm, S1, S2 normal, no murmur, click, rub or gallop Lung:chest clear, no wheezing, rales, normal symmetric air entry. Abdomin: soft, non-tender, without masses or organomegaly no rebound or guarding. EXT:no erythema, induration, or nodules Neurological: No deficits noted.  Lab Results: Lab Results  Component Value Date   WBC 5.5 02/17/2016   HGB 11.0* 02/17/2016   HCT 32.6* 02/17/2016   MCV 87.2 02/17/2016   PLT 193 02/17/2016     Chemistry      Component Value Date/Time   NA 138 02/17/2016 1533   NA 135 09/01/2015 1503   K 3.8 02/17/2016 1533   K 4.4 09/01/2015 1503   CL 103 09/01/2015 1503   CL 105 08/09/2012 0918   CO2 26 02/17/2016 1533   CO2 21* 09/01/2015 1503   BUN 19.1 02/17/2016 1533   BUN 15 09/01/2015 1503   CREATININE 1.2 02/17/2016 1533   CREATININE 1.28* 09/01/2015 1503      Component Value Date/Time   CALCIUM 9.9 02/17/2016 1533   CALCIUM 9.1 09/01/2015 1503   ALKPHOS 85 02/17/2016 1533   ALKPHOS 58 02/09/2012 0930   AST 10 02/17/2016 1533   AST 37 02/09/2012 0930   ALT 10 02/17/2016 1533   ALT 46 02/09/2012 0930   BILITOT 0.32 02/17/2016 1533   BILITOT 0.3 02/09/2012 0930      EXAM: METASTATIC BONE SURVEY  COMPARISON: 01/19/2015.  FINDINGS: Imaging of the axial and appendicular skeleton performed. Diffuse degenerative changes are present about the cervical, thoracic, and lumbar spines. Mild L2 compression is noted. This is new. A a small lucency is noted the proximal right femur. A a small lucency is noted in the proximal right humerus. Lucencies are noted in both scapula. Old right proximal fibular fracture again noted. Left base pleural parenchymal scarring again noted. Peripheral vascular disease  noted.  IMPRESSION: 1. New mild L2 compression fracture.  2. New small lucencies noted in the proximal right femur, proximal right humerus, and both scapula. These findings would be consistent with myeloma.  Results for KAIRAV, RUSSOMANNO (MRN [id number]) as of 01/14/2016 10:53  Ref. Range 10/01/2015 13:37 01/07/2016 10:25  M Protein SerPl Elph-Mcnc Latest Ref Range: Not Observed g/dL 2.4 (H) 3.0 (H)     Impression and Plan:  70 year old with:  1. 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 He has active disease started in June 2017 when bone lesions detected on his skeletal survey.  He completed the first cycle of Revlimid at 25 mg daily with dexamethasone without major complications. The plan is to proceed with one week off as planned and resume cycle 2 of Revlimid in one week. Dose reduction of Revlimid can be  considered if he continues to have issues with appetite and weight loss. Overall therapy and been tolerated and his CBC appeared within normal range.  2.  Back pain and hip pain: Related to multiple myeloma rather than degenerative disc disease. He is followed at the pain clinic.  3. Bone health: Zometa has been offered in the past and have declined. We will continue to address that with him future visits.  4. Weight loss: We have discussed different strategies to improve his oral intake including using boost and ensure. He is aware of the strategies and willing to try.  5.  Follow-up: In 1 month to assess his response to therapy.   Zola Button, MD 7/20/20174:32 PM

## 2016-02-17 NOTE — Telephone Encounter (Signed)
Gave pt cal & avs °

## 2016-02-18 LAB — KAPPA/LAMBDA LIGHT CHAINS
Ig Kappa Free Light Chain: 27.9 mg/L — ABNORMAL HIGH (ref 3.3–19.4)
Ig Lambda Free Light Chain: 17.9 mg/L (ref 5.7–26.3)
Kappa/Lambda FluidC Ratio: 1.56 (ref 0.26–1.65)

## 2016-02-21 LAB — MULTIPLE MYELOMA PANEL, SERUM
Albumin SerPl Elph-Mcnc: 3.2 g/dL (ref 2.9–4.4)
Albumin/Glob SerPl: 0.8 (ref 0.7–1.7)
Alpha 1: 0.3 g/dL (ref 0.0–0.4)
Alpha2 Glob SerPl Elph-Mcnc: 0.9 g/dL (ref 0.4–1.0)
B-Globulin SerPl Elph-Mcnc: 2.5 g/dL — ABNORMAL HIGH (ref 0.7–1.3)
Gamma Glob SerPl Elph-Mcnc: 0.6 g/dL (ref 0.4–1.8)
Globulin, Total: 4.2 g/dL — ABNORMAL HIGH (ref 2.2–3.9)
IgA, Qn, Serum: 2142 mg/dL — ABNORMAL HIGH (ref 61–437)
IgG, Qn, Serum: 576 mg/dL — ABNORMAL LOW (ref 700–1600)
IgM, Qn, Serum: 9 mg/dL — ABNORMAL LOW (ref 15–143)
M Protein SerPl Elph-Mcnc: 2.2 g/dL — ABNORMAL HIGH
Total Protein: 7.4 g/dL (ref 6.0–8.5)

## 2016-03-08 ENCOUNTER — Other Ambulatory Visit: Payer: Self-pay | Admitting: *Deleted

## 2016-03-08 DIAGNOSIS — C9000 Multiple myeloma not having achieved remission: Secondary | ICD-10-CM

## 2016-03-08 MED ORDER — LENALIDOMIDE 25 MG PO CAPS: 25.0000 mg | ORAL_CAPSULE | Freq: Every day | ORAL | 0 refills | Status: DC

## 2016-03-10 DIAGNOSIS — M545 Low back pain: Secondary | ICD-10-CM | POA: Diagnosis not present

## 2016-03-10 DIAGNOSIS — M5416 Radiculopathy, lumbar region: Secondary | ICD-10-CM | POA: Diagnosis not present

## 2016-03-10 DIAGNOSIS — G8929 Other chronic pain: Secondary | ICD-10-CM | POA: Diagnosis not present

## 2016-03-16 ENCOUNTER — Other Ambulatory Visit (HOSPITAL_BASED_OUTPATIENT_CLINIC_OR_DEPARTMENT_OTHER): Payer: Commercial Managed Care - HMO

## 2016-03-16 ENCOUNTER — Telehealth: Payer: Self-pay | Admitting: Oncology

## 2016-03-16 ENCOUNTER — Ambulatory Visit (HOSPITAL_BASED_OUTPATIENT_CLINIC_OR_DEPARTMENT_OTHER): Payer: Commercial Managed Care - HMO | Admitting: Oncology

## 2016-03-16 VITALS — BP 148/72 | HR 109 | Temp 98.2°F | Resp 18 | Ht 72.0 in | Wt 179.9 lb

## 2016-03-16 DIAGNOSIS — C9000 Multiple myeloma not having achieved remission: Secondary | ICD-10-CM

## 2016-03-16 LAB — COMPREHENSIVE METABOLIC PANEL
ALT: 20 U/L (ref 0–55)
AST: 11 U/L (ref 5–34)
Albumin: 3.4 g/dL — ABNORMAL LOW (ref 3.5–5.0)
Alkaline Phosphatase: 102 U/L (ref 40–150)
Anion Gap: 12 mEq/L — ABNORMAL HIGH (ref 3–11)
BUN: 23.2 mg/dL (ref 7.0–26.0)
CO2: 22 mEq/L (ref 22–29)
Calcium: 9.7 mg/dL (ref 8.4–10.4)
Chloride: 102 mEq/L (ref 98–109)
Creatinine: 1.2 mg/dL (ref 0.7–1.3)
EGFR: 72 mL/min/{1.73_m2} — ABNORMAL LOW (ref >90)
Glucose: 267 mg/dl — ABNORMAL HIGH (ref 70–140)
Potassium: 4 mEq/L (ref 3.5–5.1)
Sodium: 136 mEq/L (ref 136–145)
Total Bilirubin: 0.39 mg/dL (ref 0.20–1.20)
Total Protein: 7.7 g/dL (ref 6.4–8.3)

## 2016-03-16 LAB — CBC WITH DIFFERENTIAL/PLATELET
BASO%: 0.5 % (ref 0.0–2.0)
Basophils Absolute: 0 10*3/uL (ref 0.0–0.1)
EOS%: 4.3 % (ref 0.0–7.0)
Eosinophils Absolute: 0.3 10*3/uL (ref 0.0–0.5)
HCT: 41.1 % (ref 38.4–49.9)
HGB: 13.4 g/dL (ref 13.0–17.1)
LYMPH%: 16.7 % (ref 14.0–49.0)
MCH: 29.1 pg (ref 27.2–33.4)
MCHC: 32.7 g/dL (ref 32.0–36.0)
MCV: 89 fL (ref 79.3–98.0)
MONO#: 1.2 10*3/uL — ABNORMAL HIGH (ref 0.1–0.9)
MONO%: 15.1 % — ABNORMAL HIGH (ref 0.0–14.0)
NEUT#: 4.9 10*3/uL (ref 1.5–6.5)
NEUT%: 63.4 % (ref 39.0–75.0)
Platelets: 186 10*3/uL (ref 140–400)
RBC: 4.61 10*6/uL (ref 4.20–5.82)
RDW: 16.7 % — ABNORMAL HIGH (ref 11.0–14.6)
WBC: 7.7 10*3/uL (ref 4.0–10.3)
lymph#: 1.3 10*3/uL (ref 0.9–3.3)

## 2016-03-16 NOTE — Telephone Encounter (Signed)
Gave patient avs report and appointments for September.  °

## 2016-03-16 NOTE — Progress Notes (Signed)
Hematology and Oncology Follow Up Visit  James [name] [id number] 01/07/1946 70 y.o. 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 Active myeloma diagnosed in June 2017 with an M spike of 3 g/dL and an IgA level of 3700. He was found to have mild anemia and bone marrow involvement. Bone marrow biopsy in April 2016 showed plasma cell myeloma with percentage ranging between 14-30%. Skeletal survey in June 2017 showed no bone lesions  Current therapy: Revlimid and dexamethasone started on January 28 2016. He is currently receiving Revlimid at 25 mg daily for 21 days with dexamethasone at 20 mg weekly.  Interim History:  James [name] returns today for a follow up visit. Since the last visit, he reports no major changes in his health. He continues to tolerate Revlimid and dexamethasone without major complications. He does report decrease in his appetite and her last more weight since the last visit. He denied any nausea or vomiting. Denied any diarrhea or dermatological toxicities.    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 His mobility has been limited because of diffuse bone pain although he uses a cane for ambulation without any recent falls or syncope. He does ambulate inside the house but his movement outside his house has been limited.   He does not report any headaches, blurry vision, syncope or seizures. He does not report any fevers or chills or sweats. Does not report any cough or hemoptysis or hematemesis. He does not report any chest pain palpitation or orthopnea. He reports no nausea or vomiting or abdominal pain. Does not report any frequency urgency or hesitancy. He does not report any lymphadenopathy or petechiae. The remaining review of systems unremarkable.    Medications: I have reviewed the patient's current  medications.  Current Outpatient Prescriptions  Medication Sig Dispense Refill  . amLODipine (NORVASC) 10 MG tablet Take 10 mg by mouth every morning.     Marland Kitchen dexamethasone (DECADRON) 4 MG tablet Take 5 tablets weekly. 40 tablet 3  . famotidine (PEPCID) 20 MG tablet Take 1 tablet (20 mg total) by mouth 2 (two) times daily. 10 tablet 0  . fentaNYL (DURAGESIC - DOSED MCG/HR) 50 MCG/HR Place onto the skin.    . ferrous sulfate 325 (65 FE) MG tablet Take 325 mg by mouth 2 (two) times daily with a meal.     . finasteride (PROSCAR) 5 MG tablet Take 5 mg by mouth daily.    Marland Kitchen gabapentin (NEURONTIN) 400 MG capsule Take 400 mg by mouth 3 (three) times daily.     Marland Kitchen glimepiride (AMARYL) 2 MG tablet Take 2 mg by mouth daily.    Marland Kitchen HYDROcodone-acetaminophen (NORCO) 10-325 MG tablet Take by mouth.    . lenalidomide (REVLIMID) 25 MG capsule Take 1 capsule (25 mg total) by mouth daily. 21 capsule 0  . losartan (COZAAR) 50 MG tablet Take 50 mg by mouth every morning.     . metFORMIN (GLUCOPHAGE-XR) 500 MG 24 hr tablet Take 1,000 mg by mouth 2 (two) times daily.     Marland Kitchen omeprazole (PRILOSEC) 20 MG capsule Take 20 mg by mouth 4 (four) times daily.     . Tamsulosin HCl (FLOMAX) 0.4 MG CAPS Take 0.4 mg by mouth 2 (two) times daily.     . traMADol (ULTRAM) 50 MG tablet Take 50 mg by mouth 4 (four) times  daily -  with meals and at bedtime. For pain     No current facility-administered medications for this visit.     Allergies:  Allergies  Allergen Reactions  . Invokana [Canagliflozin] Anaphylaxis    Facial and lip swelling    Past Medical History, Surgical history, Social history, and Family History were reviewed and updated.   Physical Exam: Blood pressure (!) 148/72, pulse (!) 109, temperature 98.2 F (36.8 C), temperature source Oral, resp. rate 18, height 6' (1.829 m), weight 179 lb 14.4 oz (81.6 kg), SpO2 100 %. ECOG: 1 General appearance: Alert, awake gentleman without distress. Head: Normocephalic,  without obvious abnormality no oral ulcers or lesions. Neck: no adenopathy Lymph nodes: Cervical, supraclavicular, and axillary nodes normal. Heart:regular rate and rhythm, S1, S2 normal, no murmur, click, rub or gallop Lung:chest clear, no wheezing, rales, normal symmetric air entry. Abdomin: soft, non-tender, without masses or organomegaly no shifting dullness or ascites. EXT:no erythema, induration, or nodules Neurological: No deficits noted.  Lab Results: Lab Results  Component Value Date   WBC 7.7 03/16/2016   HGB 13.4 03/16/2016   HCT 41.1 03/16/2016   MCV 89.0 03/16/2016   PLT 186 03/16/2016     Chemistry      Component Value Date/Time   NA 136 03/16/2016 1533   K 4.0 03/16/2016 1533   CL 103 09/01/2015 1503   CL 105 08/09/2012 0918   CO2 22 03/16/2016 1533   BUN 23.2 03/16/2016 1533   CREATININE 1.2 03/16/2016 1533      Component Value Date/Time   CALCIUM 9.7 03/16/2016 1533   ALKPHOS 102 03/16/2016 1533   AST 11 03/16/2016 1533   ALT 20 03/16/2016 1533   BILITOT 0.39 03/16/2016 1533       Results for James [name] (MRN [id number]) as of 03/16/2016 15:09  Ref. Range 10/01/2015 13:37 01/07/2016 10:25 02/17/2016 15:33  IgA/Immunoglobulin A, Serum Latest Ref Range: 61 - 437 mg/dL 3,134 (H) 3,721 (H) 2,142 (H)     Impression and Plan:  70 year old with:  1. 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 He has active disease started in June 2017 when bone lesions detected on his skeletal survey.  He is currently on Revlimid at 25 mg daily with dexamethasone without major complications. He is finishing cycle 2 of therapy and after the first month of therapy his IgA decreased substantially from 3700 to 2100. His M spike also decreased from 3.0 to 2.2 . The plan is to continue the same dose and schedule and follow him on a monthly basis.  2.  Back pain and hip pain: Related to multiple myeloma rather than degenerative disc disease. He continues  to follow in the pain clinic and pain is manageable.  3. Bone health: Zometa has been offered in the past and have declined. We will continue to address that with him future visits.  4. Weight loss: I continue to recommend nutritional supplements multiple times a day and we also discussed different strategies to boost his appetite.  5.  Follow-up: In 1 month to assess his response to therapy.   Zola Button, MD 8/17/20174:24 PM

## 2016-03-17 LAB — KAPPA/LAMBDA LIGHT CHAINS
Ig Kappa Free Light Chain: 23.1 mg/L — ABNORMAL HIGH (ref 3.3–19.4)
Ig Lambda Free Light Chain: 16.1 mg/L (ref 5.7–26.3)
Kappa/Lambda FluidC Ratio: 1.43 (ref 0.26–1.65)

## 2016-03-21 DIAGNOSIS — R0602 Shortness of breath: Secondary | ICD-10-CM | POA: Diagnosis not present

## 2016-03-21 DIAGNOSIS — C9000 Multiple myeloma not having achieved remission: Secondary | ICD-10-CM | POA: Diagnosis not present

## 2016-03-22 LAB — MULTIPLE MYELOMA PANEL, SERUM
Albumin SerPl Elph-Mcnc: 3.6 g/dL (ref 2.9–4.4)
Albumin/Glob SerPl: 1.1 (ref 0.7–1.7)
Alpha 1: 0.2 g/dL (ref 0.0–0.4)
Alpha2 Glob SerPl Elph-Mcnc: 0.9 g/dL (ref 0.4–1.0)
B-Globulin SerPl Elph-Mcnc: 1.8 g/dL — ABNORMAL HIGH (ref 0.7–1.3)
Gamma Glob SerPl Elph-Mcnc: 0.6 g/dL (ref 0.4–1.8)
Globulin, Total: 3.5 g/dL (ref 2.2–3.9)
IgA, Qn, Serum: 1572 mg/dL — ABNORMAL HIGH (ref 61–437)
IgG, Qn, Serum: 578 mg/dL — ABNORMAL LOW (ref 700–1600)
IgM, Qn, Serum: 12 mg/dL — ABNORMAL LOW (ref 15–143)
M Protein SerPl Elph-Mcnc: 0.8 g/dL — ABNORMAL HIGH
Total Protein: 7.1 g/dL (ref 6.0–8.5)

## 2016-03-28 DIAGNOSIS — M5136 Other intervertebral disc degeneration, lumbar region: Secondary | ICD-10-CM | POA: Diagnosis not present

## 2016-03-28 DIAGNOSIS — M5441 Lumbago with sciatica, right side: Secondary | ICD-10-CM | POA: Diagnosis not present

## 2016-03-28 DIAGNOSIS — M5416 Radiculopathy, lumbar region: Secondary | ICD-10-CM | POA: Diagnosis not present

## 2016-03-28 DIAGNOSIS — M21371 Foot drop, right foot: Secondary | ICD-10-CM | POA: Diagnosis not present

## 2016-03-28 DIAGNOSIS — E1142 Type 2 diabetes mellitus with diabetic polyneuropathy: Secondary | ICD-10-CM | POA: Diagnosis not present

## 2016-03-28 DIAGNOSIS — G8929 Other chronic pain: Secondary | ICD-10-CM | POA: Diagnosis not present

## 2016-03-28 DIAGNOSIS — M5442 Lumbago with sciatica, left side: Secondary | ICD-10-CM | POA: Diagnosis not present

## 2016-03-28 DIAGNOSIS — Z79899 Other long term (current) drug therapy: Secondary | ICD-10-CM | POA: Diagnosis not present

## 2016-03-31 ENCOUNTER — Other Ambulatory Visit: Payer: Self-pay | Admitting: *Deleted

## 2016-03-31 DIAGNOSIS — C9000 Multiple myeloma not having achieved remission: Secondary | ICD-10-CM

## 2016-03-31 MED ORDER — LENALIDOMIDE 25 MG PO CAPS: 25.0000 mg | ORAL_CAPSULE | Freq: Every day | ORAL | 0 refills | Status: DC

## 2016-04-19 ENCOUNTER — Other Ambulatory Visit: Payer: Commercial Managed Care - HMO

## 2016-04-19 ENCOUNTER — Telehealth: Payer: Self-pay | Admitting: Oncology

## 2016-04-19 ENCOUNTER — Ambulatory Visit: Payer: Commercial Managed Care - HMO | Admitting: Oncology

## 2016-04-19 NOTE — Telephone Encounter (Signed)
Patient called to reschedule 09/20 appointment to 09/21.

## 2016-04-20 ENCOUNTER — Ambulatory Visit (HOSPITAL_BASED_OUTPATIENT_CLINIC_OR_DEPARTMENT_OTHER): Payer: Commercial Managed Care - HMO | Admitting: Oncology

## 2016-04-20 ENCOUNTER — Telehealth: Payer: Self-pay | Admitting: Oncology

## 2016-04-20 ENCOUNTER — Other Ambulatory Visit (HOSPITAL_BASED_OUTPATIENT_CLINIC_OR_DEPARTMENT_OTHER): Payer: Commercial Managed Care - HMO

## 2016-04-20 VITALS — BP 151/68 | HR 98 | Temp 97.8°F | Resp 17 | Ht 72.0 in | Wt 187.2 lb

## 2016-04-20 DIAGNOSIS — C9000 Multiple myeloma not having achieved remission: Secondary | ICD-10-CM

## 2016-04-20 DIAGNOSIS — D630 Anemia in neoplastic disease: Secondary | ICD-10-CM

## 2016-04-20 LAB — CBC WITH DIFFERENTIAL/PLATELET
BASO%: 0.9 % (ref 0.0–2.0)
Basophils Absolute: 0.1 10*3/uL (ref 0.0–0.1)
EOS%: 2 % (ref 0.0–7.0)
Eosinophils Absolute: 0.1 10*3/uL (ref 0.0–0.5)
HCT: 39.6 % (ref 38.4–49.9)
HGB: 13.1 g/dL (ref 13.0–17.1)
LYMPH%: 29.8 % (ref 14.0–49.0)
MCH: 29.9 pg (ref 27.2–33.4)
MCHC: 33.1 g/dL (ref 32.0–36.0)
MCV: 90.4 fL (ref 79.3–98.0)
MONO#: 0.7 10*3/uL (ref 0.1–0.9)
MONO%: 11.7 % (ref 0.0–14.0)
NEUT#: 3.3 10*3/uL (ref 1.5–6.5)
NEUT%: 55.6 % (ref 39.0–75.0)
Platelets: 278 10*3/uL (ref 140–400)
RBC: 4.38 10*6/uL (ref 4.20–5.82)
RDW: 18.6 % — ABNORMAL HIGH (ref 11.0–14.6)
WBC: 6 10*3/uL (ref 4.0–10.3)
lymph#: 1.8 10*3/uL (ref 0.9–3.3)

## 2016-04-20 LAB — COMPREHENSIVE METABOLIC PANEL
ALT: 18 U/L (ref 0–55)
AST: 13 U/L (ref 5–34)
Albumin: 3.4 g/dL — ABNORMAL LOW (ref 3.5–5.0)
Alkaline Phosphatase: 154 U/L — ABNORMAL HIGH (ref 40–150)
Anion Gap: 13 mEq/L — ABNORMAL HIGH (ref 3–11)
BUN: 14.3 mg/dL (ref 7.0–26.0)
CO2: 22 mEq/L (ref 22–29)
Calcium: 9.5 mg/dL (ref 8.4–10.4)
Chloride: 100 mEq/L (ref 98–109)
Creatinine: 1.3 mg/dL (ref 0.7–1.3)
EGFR: 63 mL/min/{1.73_m2} — ABNORMAL LOW (ref >90)
Glucose: 323 mg/dl — ABNORMAL HIGH (ref 70–140)
Potassium: 4.4 mEq/L (ref 3.5–5.1)
Sodium: 135 mEq/L — ABNORMAL LOW (ref 136–145)
Total Bilirubin: 0.33 mg/dL (ref 0.20–1.20)
Total Protein: 7.7 g/dL (ref 6.4–8.3)

## 2016-04-20 NOTE — Progress Notes (Signed)
Hematology and Oncology Follow Up Visit  James [name] [id number] Oct 02, 1945 70 y.o. 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 Active myeloma diagnosed in June 2017 with an M spike of 3 g/dL and an IgA level of 3700. He was found to have mild anemia and bone marrow involvement. Bone marrow biopsy in April 2016 showed plasma cell myeloma with percentage ranging between 14-30%. Skeletal survey in June 2017 showed no bone lesions  Current therapy:  Revlimid and dexamethasone started on January 28 2016. He is currently receiving Revlimid at 25 mg daily for 21 days with dexamethasone at 20 mg weekly.  Interim History:  James [name] returns today for a follow up visit. Since the last visit, he continues slight improvement in his overall health. His appetite did improve and gained close to 8 pounds. He continues to tolerate Revlimid and dexamethasone without major complications. He does report mild fatigue and nausea but no vomiting. He denied any diarrhea or skin rash.   He continues to have chronic back pain and hip pain which is arthritic in nature and not changed. His mobility has been limited because of diffuse bone pain but is able to ambulate with the help of a walker. He denied any falls or syncope. He denied any recent hospitalizations, neuropathy or recurrent infections.   He does not report any headaches, blurry vision, syncope or seizures. He does not report any fevers or chills or sweats. Does not report any cough or hemoptysis or hematemesis. He does not report any chest pain palpitation or orthopnea. He reports no nausea or vomiting or abdominal pain. Does not report any frequency urgency or hesitancy. He does not report any lymphadenopathy or petechiae. The remaining review of systems unremarkable.    Medications: I have reviewed the patient's current medications.  Current  Outpatient Prescriptions  Medication Sig Dispense Refill  . amLODipine (NORVASC) 10 MG tablet Take 10 mg by mouth every morning.     Marland Kitchen dexamethasone (DECADRON) 4 MG tablet Take 5 tablets weekly. 40 tablet 3  . famotidine (PEPCID) 20 MG tablet Take 1 tablet (20 mg total) by mouth 2 (two) times daily. 10 tablet 0  . fentaNYL (DURAGESIC - DOSED MCG/HR) 50 MCG/HR Place onto the skin.    . ferrous sulfate 325 (65 FE) MG tablet Take 325 mg by mouth 2 (two) times daily with a meal.     . finasteride (PROSCAR) 5 MG tablet Take 5 mg by mouth daily.    Marland Kitchen gabapentin (NEURONTIN) 400 MG capsule Take 400 mg by mouth 3 (three) times daily.     Marland Kitchen glimepiride (AMARYL) 2 MG tablet Take 2 mg by mouth daily.    Marland Kitchen lenalidomide (REVLIMID) 25 MG capsule Take 1 capsule (25 mg total) by mouth daily. 21 capsule 0  . losartan (COZAAR) 50 MG tablet Take 50 mg by mouth every morning.     . metFORMIN (GLUCOPHAGE-XR) 500 MG 24 hr tablet Take 1,000 mg by mouth 2 (two) times daily.     Marland Kitchen omeprazole (PRILOSEC) 20 MG capsule Take 20 mg by mouth 4 (four) times daily.     . Tamsulosin HCl (FLOMAX) 0.4 MG CAPS Take 0.4 mg by mouth 2 (two) times daily.     . traMADol (ULTRAM) 50 MG tablet Take 50 mg by mouth 4 (four) times daily -  with meals and at bedtime. For pain     No current  facility-administered medications for this visit.     Allergies:  Allergies  Allergen Reactions  . Invokana [Canagliflozin] Anaphylaxis    Facial and lip swelling    Past Medical History, Surgical history, Social history, and Family History were reviewed and updated.   Physical Exam: Blood pressure (!) 151/68, pulse 98, temperature 97.8 F (36.6 C), temperature source Oral, resp. rate 17, height 6' (1.829 m), weight 187 lb 3.2 oz (84.9 kg), SpO2 99 %. ECOG: 1 General appearance: Well-appearing gentleman without distress. Head: Normocephalic, without obvious abnormality no oral thrush noted. Neck: no adenopathy Lymph nodes: Cervical,  supraclavicular, and axillary nodes normal. Heart:regular rate and rhythm, S1, S2 normal, no murmur, click, rub or gallop Lung:chest clear, no wheezing, rales, normal symmetric air entry. Abdomin: soft, non-tender, without masses or organomegaly no rebound or guarding. EXT:no erythema, induration, or nodules Neurological: No deficits noted.  Lab Results: Lab Results  Component Value Date   WBC 6.0 04/20/2016   HGB 13.1 04/20/2016   HCT 39.6 04/20/2016   MCV 90.4 04/20/2016   PLT 278 04/20/2016     Chemistry      Component Value Date/Time   NA 136 03/16/2016 1533   K 4.0 03/16/2016 1533   CL 103 09/01/2015 1503   CL 105 08/09/2012 0918   CO2 22 03/16/2016 1533   BUN 23.2 03/16/2016 1533   CREATININE 1.2 03/16/2016 1533      Component Value Date/Time   CALCIUM 9.7 03/16/2016 1533   ALKPHOS 102 03/16/2016 1533   AST 11 03/16/2016 1533   ALT 20 03/16/2016 1533   BILITOT 0.39 03/16/2016 1533       Results for James [name] (MRN [id number]) as of 04/20/2016 12:37  Ref. Range 01/07/2016 10:25 02/17/2016 15:33 03/16/2016 15:33  M Protein SerPl Elph-Mcnc Latest Ref Range: Not Observed g/dL 3.0 (H) 2.2 (H) 0.8 (H)   Results for James [name] (MRN [id number]) as of 04/20/2016 12:37  Ref. Range 01/07/2016 10:25 02/17/2016 15:32 02/17/2016 15:33 02/17/2016 15:33 03/16/2016 15:33  IgA/Immunoglobulin A, Serum Latest Ref Range: 61 - 437 mg/dL 3,721 (H)  2,142 (H)  1,572 (H)   Results for James [name] (MRN [id number]) as of 04/20/2016 12:37  Ref. Range 01/07/2016 10:25 02/17/2016 15:32 02/17/2016 15:33 02/17/2016 15:33 03/16/2016 15:33  Kappa/Lambda FluidC Ratio Latest Ref Range: 0.26 - 1.65  2.36 (H)  1.56  1.43    Impression and Plan:  70 year old with:  1. 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 He has active disease started in June 2017 without bone lesions detected on his skeletal survey.  He is currently on Revlimid at 25 mg daily with dexamethasone without  major complications. He is status post 3 cycles of therapy and showed excellent response so far. His M spike has decreased from 3 g to 0.8 g. In his IgG level have decreased by more than 50% after 2 cycles. The plan is to continue with the same dose and schedule and repeat protein studies after each cycle. The plan is to continue on this regimen 2 he gets close to complete response. He is approaching complete response.   2.  Back pain and hip pain: Related to degenerative disc disease. He continues to follow in the pain clinic and pain is manageable.  3. Bone health: Zometa has been offered in the past and have declined. We will continue to address that with him future visits.  4. Weight loss: His weight have improved dramatically at this time  and his overall health has improved.  5. Anemia: Related to multiple myeloma and appears to be resolving at this time. His hemoglobin is back within normal range.  6.  Follow-up: In 1 month to assess his response to therapy.   Zola Button, MD 9/21/201712:53 PM

## 2016-04-20 NOTE — Telephone Encounter (Signed)
Avs report and appointment schedule given to patient, per 04/20/16 los. °

## 2016-04-21 ENCOUNTER — Other Ambulatory Visit: Payer: Self-pay | Admitting: Oncology

## 2016-04-21 DIAGNOSIS — C9000 Multiple myeloma not having achieved remission: Secondary | ICD-10-CM

## 2016-04-21 LAB — KAPPA/LAMBDA LIGHT CHAINS
Ig Kappa Free Light Chain: 30.2 mg/L — ABNORMAL HIGH (ref 3.3–19.4)
Ig Lambda Free Light Chain: 21.8 mg/L (ref 5.7–26.3)
Kappa/Lambda FluidC Ratio: 1.39 (ref 0.26–1.65)

## 2016-04-25 LAB — MULTIPLE MYELOMA PANEL, SERUM
Albumin SerPl Elph-Mcnc: 3.6 g/dL (ref 2.9–4.4)
Albumin/Glob SerPl: 1.1 (ref 0.7–1.7)
Alpha 1: 0.2 g/dL (ref 0.0–0.4)
Alpha2 Glob SerPl Elph-Mcnc: 1 g/dL (ref 0.4–1.0)
B-Globulin SerPl Elph-Mcnc: 1.7 g/dL — ABNORMAL HIGH (ref 0.7–1.3)
Gamma Glob SerPl Elph-Mcnc: 0.6 g/dL (ref 0.4–1.8)
Globulin, Total: 3.6 g/dL (ref 2.2–3.9)
IgA, Qn, Serum: 723 mg/dL — ABNORMAL HIGH (ref 61–437)
IgG, Qn, Serum: 630 mg/dL — ABNORMAL LOW (ref 700–1600)
IgM, Qn, Serum: 19 mg/dL (ref 15–143)
M Protein SerPl Elph-Mcnc: 0.5 g/dL — ABNORMAL HIGH
Total Protein: 7.2 g/dL (ref 6.0–8.5)

## 2016-05-12 ENCOUNTER — Other Ambulatory Visit: Payer: Self-pay | Admitting: Oncology

## 2016-05-12 DIAGNOSIS — C9000 Multiple myeloma not having achieved remission: Secondary | ICD-10-CM

## 2016-05-15 ENCOUNTER — Telehealth: Payer: Self-pay | Admitting: Medical Oncology

## 2016-05-15 NOTE — Telephone Encounter (Signed)
Needs auth number for revlimid rx that was written last Friday.It is not on prescription

## 2016-05-16 ENCOUNTER — Encounter: Payer: Self-pay | Admitting: *Deleted

## 2016-05-16 NOTE — Progress Notes (Signed)
Faxed requested authorization # to [place], for revlimid.

## 2016-05-17 ENCOUNTER — Other Ambulatory Visit (HOSPITAL_BASED_OUTPATIENT_CLINIC_OR_DEPARTMENT_OTHER): Payer: Commercial Managed Care - HMO

## 2016-05-17 DIAGNOSIS — C9000 Multiple myeloma not having achieved remission: Secondary | ICD-10-CM

## 2016-05-17 LAB — COMPREHENSIVE METABOLIC PANEL
ALT: 9 U/L (ref 0–55)
AST: 12 U/L (ref 5–34)
Albumin: 3.3 g/dL — ABNORMAL LOW (ref 3.5–5.0)
Alkaline Phosphatase: 137 U/L (ref 40–150)
Anion Gap: 12 mEq/L — ABNORMAL HIGH (ref 3–11)
BUN: 9.7 mg/dL (ref 7.0–26.0)
CO2: 24 mEq/L (ref 22–29)
Calcium: 9.2 mg/dL (ref 8.4–10.4)
Chloride: 104 mEq/L (ref 98–109)
Creatinine: 1.1 mg/dL (ref 0.7–1.3)
EGFR: 80 mL/min/{1.73_m2} — ABNORMAL LOW (ref >90)
Glucose: 228 mg/dl — ABNORMAL HIGH (ref 70–140)
Potassium: 3.4 mEq/L — ABNORMAL LOW (ref 3.5–5.1)
Sodium: 140 mEq/L (ref 136–145)
Total Bilirubin: 0.28 mg/dL (ref 0.20–1.20)
Total Protein: 7.3 g/dL (ref 6.4–8.3)

## 2016-05-17 LAB — CBC WITH DIFFERENTIAL/PLATELET
BASO%: 0.8 % (ref 0.0–2.0)
Basophils Absolute: 0 10*3/uL (ref 0.0–0.1)
EOS%: 2.8 % (ref 0.0–7.0)
Eosinophils Absolute: 0.2 10*3/uL (ref 0.0–0.5)
HCT: 35.4 % — ABNORMAL LOW (ref 38.4–49.9)
HGB: 11.7 g/dL — ABNORMAL LOW (ref 13.0–17.1)
LYMPH%: 29.4 % (ref 14.0–49.0)
MCH: 30.3 pg (ref 27.2–33.4)
MCHC: 33.1 g/dL (ref 32.0–36.0)
MCV: 91.7 fL (ref 79.3–98.0)
MONO#: 0.7 10*3/uL (ref 0.1–0.9)
MONO%: 11.4 % (ref 0.0–14.0)
NEUT#: 3.2 10*3/uL (ref 1.5–6.5)
NEUT%: 55.6 % (ref 39.0–75.0)
Platelets: 251 10*3/uL (ref 140–400)
RBC: 3.86 10*6/uL — ABNORMAL LOW (ref 4.20–5.82)
RDW: 18.9 % — ABNORMAL HIGH (ref 11.0–14.6)
WBC: 5.7 10*3/uL (ref 4.0–10.3)
lymph#: 1.7 10*3/uL (ref 0.9–3.3)

## 2016-05-18 LAB — KAPPA/LAMBDA LIGHT CHAINS
Ig Kappa Free Light Chain: 29.9 mg/L — ABNORMAL HIGH (ref 3.3–19.4)
Ig Lambda Free Light Chain: 18.5 mg/L (ref 5.7–26.3)
Kappa/Lambda FluidC Ratio: 1.62 (ref 0.26–1.65)

## 2016-05-21 DIAGNOSIS — J449 Chronic obstructive pulmonary disease, unspecified: Secondary | ICD-10-CM

## 2016-05-21 DIAGNOSIS — I2510 Atherosclerotic heart disease of native coronary artery without angina pectoris: Secondary | ICD-10-CM

## 2016-05-21 DIAGNOSIS — Z7901 Long term (current) use of anticoagulants: Secondary | ICD-10-CM

## 2016-05-21 HISTORY — DX: Long term (current) use of anticoagulants: Z79.01

## 2016-05-21 HISTORY — DX: Atherosclerotic heart disease of native coronary artery without angina pectoris: I25.10

## 2016-05-21 HISTORY — DX: Chronic obstructive pulmonary disease, unspecified: J44.9

## 2016-05-22 DIAGNOSIS — I110 Hypertensive heart disease with heart failure: Secondary | ICD-10-CM | POA: Diagnosis not present

## 2016-05-22 DIAGNOSIS — N183 Chronic kidney disease, stage 3 (moderate): Secondary | ICD-10-CM | POA: Diagnosis not present

## 2016-05-22 DIAGNOSIS — I712 Thoracic aortic aneurysm, without rupture: Secondary | ICD-10-CM | POA: Diagnosis not present

## 2016-05-22 DIAGNOSIS — J449 Chronic obstructive pulmonary disease, unspecified: Secondary | ICD-10-CM | POA: Diagnosis not present

## 2016-05-22 DIAGNOSIS — Z7901 Long term (current) use of anticoagulants: Secondary | ICD-10-CM | POA: Diagnosis not present

## 2016-05-22 DIAGNOSIS — I481 Persistent atrial fibrillation: Secondary | ICD-10-CM | POA: Diagnosis not present

## 2016-05-22 DIAGNOSIS — I2510 Atherosclerotic heart disease of native coronary artery without angina pectoris: Secondary | ICD-10-CM | POA: Diagnosis not present

## 2016-05-22 DIAGNOSIS — I5042 Chronic combined systolic (congestive) and diastolic (congestive) heart failure: Secondary | ICD-10-CM | POA: Diagnosis not present

## 2016-05-22 LAB — MULTIPLE MYELOMA PANEL, SERUM
Albumin SerPl Elph-Mcnc: 3.5 g/dL (ref 2.9–4.4)
Albumin/Glob SerPl: 1.1 (ref 0.7–1.7)
Alpha 1: 0.3 g/dL (ref 0.0–0.4)
Alpha2 Glob SerPl Elph-Mcnc: 1 g/dL (ref 0.4–1.0)
B-Globulin SerPl Elph-Mcnc: 1.4 g/dL — ABNORMAL HIGH (ref 0.7–1.3)
Gamma Glob SerPl Elph-Mcnc: 0.6 g/dL (ref 0.4–1.8)
Globulin, Total: 3.2 g/dL (ref 2.2–3.9)
IgA, Qn, Serum: 559 mg/dL — ABNORMAL HIGH (ref 61–437)
IgG, Qn, Serum: 628 mg/dL — ABNORMAL LOW (ref 700–1600)
IgM, Qn, Serum: 14 mg/dL — ABNORMAL LOW (ref 15–143)
M Protein SerPl Elph-Mcnc: 0.4 g/dL — ABNORMAL HIGH
Total Protein: 6.7 g/dL (ref 6.0–8.5)

## 2016-05-24 ENCOUNTER — Ambulatory Visit (HOSPITAL_BASED_OUTPATIENT_CLINIC_OR_DEPARTMENT_OTHER): Payer: Commercial Managed Care - HMO | Admitting: Oncology

## 2016-05-24 ENCOUNTER — Telehealth: Payer: Self-pay | Admitting: Oncology

## 2016-05-24 VITALS — BP 145/73 | HR 112 | Temp 98.5°F | Resp 18 | Ht 72.0 in | Wt 193.3 lb

## 2016-05-24 DIAGNOSIS — D630 Anemia in neoplastic disease: Secondary | ICD-10-CM | POA: Diagnosis not present

## 2016-05-24 DIAGNOSIS — C9000 Multiple myeloma not having achieved remission: Secondary | ICD-10-CM

## 2016-05-24 NOTE — Telephone Encounter (Signed)
Appointments scheduled per 10/25 LOS. Patient requested to have appointments scheduled for Friday 12/1 instead of 11/29 per wife and his availability. AVS report and Calendar with scheduled appointments given to patient.

## 2016-05-24 NOTE — Progress Notes (Signed)
Hematology and Oncology Follow Up Visit  ADILSON GRAFTON [id number] 28-Oct-1945 70 y.o. 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 Active myeloma diagnosed in June 2017 with an M spike of 3 g/dL and an IgA level of 3700. He was found to have mild anemia and bone marrow involvement. Bone marrow biopsy in April 2016 showed plasma cell myeloma with percentage ranging between 14-30%. Skeletal survey in June 2017 showed no bone lesions  Current therapy:  Revlimid and dexamethasone started on January 28 2016. He is currently receiving Revlimid at 25 mg daily for 21 days with dexamethasone at 20 mg weekly.  Interim History:  Mr. Kaluzny returns today for a follow up visit. Since the last visit, he reports no major changes in his health. He continues to have generalized pain which have not dramatically changed. His pain has been chronic and unrelated to multiple myeloma. His appetite did improve and gained more weight. He continues to tolerate Revlimid and dexamethasone without major complications. He does report mild fatigue and nausea but no vomiting. He denied any diarrhea or skin rash. He denied any pathological fractures or falls. He continues to ambulate with the help of a cane.   He does not report any headaches, blurry vision, syncope or seizures. He does not report any fevers or chills or sweats. Does not report any cough or hemoptysis or hematemesis. He does not report any chest pain palpitation or orthopnea. He reports no nausea or vomiting or abdominal pain. Does not report any frequency urgency or hesitancy. He does not report any lymphadenopathy or petechiae. The remaining review of systems unremarkable.    Medications: I have reviewed the patient's current medications.  Current Outpatient Prescriptions  Medication Sig Dispense Refill  . amLODipine (NORVASC) 10 MG tablet Take 10 mg by  mouth every morning.     Marland Kitchen dexamethasone (DECADRON) 4 MG tablet Take 5 tablets weekly. 40 tablet 3  . famotidine (PEPCID) 20 MG tablet Take 1 tablet (20 mg total) by mouth 2 (two) times daily. 10 tablet 0  . fentaNYL (DURAGESIC - DOSED MCG/HR) 50 MCG/HR Place onto the skin.    . ferrous sulfate 325 (65 FE) MG tablet Take 325 mg by mouth 2 (two) times daily with a meal.     . finasteride (PROSCAR) 5 MG tablet Take 5 mg by mouth daily.    Marland Kitchen gabapentin (NEURONTIN) 400 MG capsule Take 400 mg by mouth 3 (three) times daily.     Marland Kitchen glimepiride (AMARYL) 2 MG tablet Take 2 mg by mouth daily.    Marland Kitchen losartan (COZAAR) 50 MG tablet Take 50 mg by mouth every morning.     . metFORMIN (GLUCOPHAGE-XR) 500 MG 24 hr tablet Take 1,000 mg by mouth 2 (two) times daily.     Marland Kitchen omeprazole (PRILOSEC) 20 MG capsule Take 20 mg by mouth 4 (four) times daily.     Marland Kitchen REVLIMID 25 MG capsule TAKE 1 CAPSULE BY MOUTH DAILY FOR 21 DAYS THEN OFF FOR 7 DAYS 21 capsule PRN  . Tamsulosin HCl (FLOMAX) 0.4 MG CAPS Take 0.4 mg by mouth 2 (two) times daily.     . traMADol (ULTRAM) 50 MG tablet Take 50 mg by mouth 4 (four) times daily -  with meals and at bedtime. For pain     No current facility-administered medications for this visit.     Allergies:  Allergies  Allergen Reactions  .  Invokana [Canagliflozin] Anaphylaxis    Facial and lip swelling    Past Medical History, Surgical history, Social history, and Family History were reviewed and updated.   Physical Exam: Blood pressure (!) 145/73, pulse (!) 112, temperature 98.5 F (36.9 C), temperature source Oral, resp. rate 18, height 6' (1.829 m), weight 193 lb 4.8 oz (87.7 kg), SpO2 99 %. ECOG: 1 General appearance: Alert, awake gentleman without distress. Head: Normocephalic, without obvious abnormality no oral thrush noted. Neck: no adenopathy Lymph nodes: Cervical, supraclavicular, and axillary nodes normal. Heart:regular rate and rhythm, S1, S2 normal, no murmur, click,  rub or gallop Lung:chest clear, no wheezing, rales, normal symmetric air entry. Abdomin: soft, non-tender, without masses or organomegaly no shifting dullness or ascites. EXT:no erythema, induration, or nodules Neurological: No deficits noted.  Lab Results: Lab Results  Component Value Date   WBC 5.7 05/17/2016   HGB 11.7 (L) 05/17/2016   HCT 35.4 (L) 05/17/2016   MCV 91.7 05/17/2016   PLT 251 05/17/2016     Chemistry      Component Value Date/Time   NA 140 05/17/2016 1136   K 3.4 (L) 05/17/2016 1136   CL 103 09/01/2015 1503   CL 105 08/09/2012 0918   CO2 24 05/17/2016 1136   BUN 9.7 05/17/2016 1136   CREATININE 1.1 05/17/2016 1136      Component Value Date/Time   CALCIUM 9.2 05/17/2016 1136   ALKPHOS 137 05/17/2016 1136   AST 12 05/17/2016 1136   ALT 9 05/17/2016 1136   BILITOT 0.28 05/17/2016 1136     Results for MAXDEN, NAJI (MRN [id number]) as of 05/24/2016 12:10  Ref. Range 03/16/2016 15:33 04/20/2016 12:26 05/17/2016 11:36  M Protein SerPl Elph-Mcnc Latest Ref Range: Not Observed g/dL 0.8 (H) 0.5 (H) 0.4 (H)     Impression and Plan:  70 year old with:  1. 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 He has active disease started in June 2017 without bone lesions detected on his skeletal survey.  He is currently on Revlimid at 25 mg daily with dexamethasone without major complications. He is status post 4 cycles of therapy and showed excellent response so far.   His M spike continues to decrease and currently at 0.4 g/dL. I plan to continue the same dose and schedule and potentially consider cutting the Revlimid dose to a maintenance dose of 10 mg daily. Dissipated doing that after 6 months of therapy.  2.  Back pain and hip pain: Related to degenerative disc disease. He continues to follow in the pain clinic and pain is manageable.  3. Bone health: Zometa has been offered in the past and have declined. We will continue to address  that with him future visits.  4. Weight loss: He continues to be better and his appetite has improved.  5. Anemia: Related to multiple myeloma and appears to be resolving at this time. His hemoglobin is back within normal range.  6.  Follow-up: In 1 month to assess his response to therapy.   Zola Button, MD 10/25/201712:23 PM

## 2016-05-31 ENCOUNTER — Telehealth: Payer: Self-pay | Admitting: *Deleted

## 2016-05-31 NOTE — Telephone Encounter (Signed)
1. "I'm having a problem.  I coughed and sneezed and feel like I broke a rib on the left chest. This has happened before.  I can see where it's sticking out.  Hurts when I reach for something.  Slight shortness of breath when reaching."   2. I'm having diarrhea.  I started the Revlimid 05-22-2016.  Yesterday I had three loose stools and so far today two but while standing to urinate it came on and I had an accident so it's getting worse.  Using anti-diarrheal (Loperamide) which is working to some degree.   I drink more than 64 oz and I make myself eat." Will notify Dr. Alen Blew.  "I do not think I need to be seen today."  Advised he sit to use the bathroom to avoid accidents.

## 2016-06-09 DIAGNOSIS — M5416 Radiculopathy, lumbar region: Secondary | ICD-10-CM | POA: Diagnosis not present

## 2016-06-15 DIAGNOSIS — R062 Wheezing: Secondary | ICD-10-CM | POA: Diagnosis not present

## 2016-06-17 ENCOUNTER — Other Ambulatory Visit: Payer: Self-pay | Admitting: Oncology

## 2016-06-17 DIAGNOSIS — C9000 Multiple myeloma not having achieved remission: Secondary | ICD-10-CM

## 2016-06-19 ENCOUNTER — Other Ambulatory Visit: Payer: Self-pay | Admitting: *Deleted

## 2016-06-19 DIAGNOSIS — C9000 Multiple myeloma not having achieved remission: Secondary | ICD-10-CM

## 2016-06-19 MED ORDER — LENALIDOMIDE 25 MG PO CAPS: ORAL_CAPSULE | ORAL | 99 refills | Status: DC

## 2016-06-27 ENCOUNTER — Telehealth: Payer: Self-pay

## 2016-06-27 NOTE — Telephone Encounter (Signed)
[facility] called stating they received revlimid Rx but it did not have [employer] authorization #. Please call with auth # to [id number] option 1 then option 5

## 2016-06-28 ENCOUNTER — Ambulatory Visit: Payer: Commercial Managed Care - HMO | Admitting: Oncology

## 2016-06-28 ENCOUNTER — Other Ambulatory Visit: Payer: Commercial Managed Care - HMO

## 2016-06-30 ENCOUNTER — Other Ambulatory Visit (HOSPITAL_BASED_OUTPATIENT_CLINIC_OR_DEPARTMENT_OTHER): Payer: Commercial Managed Care - HMO

## 2016-06-30 ENCOUNTER — Telehealth: Payer: Self-pay | Admitting: Oncology

## 2016-06-30 ENCOUNTER — Ambulatory Visit (HOSPITAL_BASED_OUTPATIENT_CLINIC_OR_DEPARTMENT_OTHER): Payer: Commercial Managed Care - HMO | Admitting: Oncology

## 2016-06-30 VITALS — BP 137/63 | HR 108 | Temp 98.3°F | Resp 17 | Ht 72.0 in | Wt 191.9 lb

## 2016-06-30 DIAGNOSIS — C9000 Multiple myeloma not having achieved remission: Secondary | ICD-10-CM

## 2016-06-30 DIAGNOSIS — D630 Anemia in neoplastic disease: Secondary | ICD-10-CM

## 2016-06-30 LAB — CBC WITH DIFFERENTIAL/PLATELET
BASO%: 0.3 % (ref 0.0–2.0)
Basophils Absolute: 0 10*3/uL (ref 0.0–0.1)
EOS%: 1.9 % (ref 0.0–7.0)
Eosinophils Absolute: 0.1 10*3/uL (ref 0.0–0.5)
HCT: 40.1 % (ref 38.4–49.9)
HGB: 13.1 g/dL (ref 13.0–17.1)
LYMPH%: 13.4 % — ABNORMAL LOW (ref 14.0–49.0)
MCH: 30.8 pg (ref 27.2–33.4)
MCHC: 32.6 g/dL (ref 32.0–36.0)
MCV: 94.6 fL (ref 79.3–98.0)
MONO#: 0.7 10*3/uL (ref 0.1–0.9)
MONO%: 10.8 % (ref 0.0–14.0)
NEUT#: 5.1 10*3/uL (ref 1.5–6.5)
NEUT%: 73.6 % (ref 39.0–75.0)
Platelets: 216 10*3/uL (ref 140–400)
RBC: 4.24 10*6/uL (ref 4.20–5.82)
RDW: 17 % — ABNORMAL HIGH (ref 11.0–14.6)
WBC: 6.9 10*3/uL (ref 4.0–10.3)
lymph#: 0.9 10*3/uL (ref 0.9–3.3)

## 2016-06-30 LAB — COMPREHENSIVE METABOLIC PANEL
ALT: 15 U/L (ref 0–55)
AST: 9 U/L (ref 5–34)
Albumin: 3.5 g/dL (ref 3.5–5.0)
Alkaline Phosphatase: 144 U/L (ref 40–150)
Anion Gap: 10 mEq/L (ref 3–11)
BUN: 13.4 mg/dL (ref 7.0–26.0)
CO2: 25 mEq/L (ref 22–29)
Calcium: 9.2 mg/dL (ref 8.4–10.4)
Chloride: 102 mEq/L (ref 98–109)
Creatinine: 1.2 mg/dL (ref 0.7–1.3)
EGFR: 68 mL/min/{1.73_m2} — ABNORMAL LOW (ref >90)
Glucose: 234 mg/dl — ABNORMAL HIGH (ref 70–140)
Potassium: 3.8 mEq/L (ref 3.5–5.1)
Sodium: 138 mEq/L (ref 136–145)
Total Bilirubin: 0.37 mg/dL (ref 0.20–1.20)
Total Protein: 6.8 g/dL (ref 6.4–8.3)

## 2016-06-30 NOTE — Telephone Encounter (Signed)
Appointments scheduled per 06/30/16 los. A copy of the AVS report and appointment schedule was given to patient,per 06/30/16 los.  ° ° °

## 2016-06-30 NOTE — Progress Notes (Signed)
Hematology and Oncology Follow Up Visit  James [name] [id number] 1946-05-31 70 y.o. 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 Active myeloma diagnosed in June 2017 with an M spike of 3 g/dL and an IgA level of 3700. He was found to have mild anemia and bone marrow involvement. Bone marrow biopsy in April 2016 showed plasma cell myeloma with percentage ranging between 14-30%. Skeletal survey in June 2017 showed no bone lesions  Current therapy:  Revlimid and dexamethasone started on January 28 2016. He is currently receiving Revlimid at 25 mg daily for 21 days with dexamethasone at 20 mg weekly.  Interim History:  James [name] returns today for a follow up visit. Since the last visit, he reports doing reasonably well without any new complaints. no major changes in his healtht. He continues to tolerate Revlimid and dexamethasone without major complications. He does report mild fatigue and nausea but no vomiting. He denied any diarrhea or skin rash. He denied any pathological fractures or falls. His appetite remained maintained and his weight is stable. He still ambulating with the help of a cane and has not reported any falls or syncope. He continues to have generalized pain that is unrelated to myeloma.   He does not report any headaches, blurry vision, syncope or seizures. He does not report any fevers or chills or sweats. Does not report any cough or hemoptysis or hematemesis. He does not report any chest pain palpitation or orthopnea. He reports no nausea or vomiting or abdominal pain. Does not report any frequency urgency or hesitancy. He does not report any lymphadenopathy or petechiae. The remaining review of systems unremarkable.    Medications: I have reviewed the patient's current medications.  Current Outpatient Prescriptions  Medication Sig Dispense Refill  . amLODipine (NORVASC)  10 MG tablet Take 10 mg by mouth every morning.     Marland Kitchen dexamethasone (DECADRON) 4 MG tablet Take 5 tablets weekly. 40 tablet 3  . famotidine (PEPCID) 20 MG tablet Take 1 tablet (20 mg total) by mouth 2 (two) times daily. 10 tablet 0  . fentaNYL (DURAGESIC - DOSED MCG/HR) 50 MCG/HR Place onto the skin.    . ferrous sulfate 325 (65 FE) MG tablet Take 325 mg by mouth 2 (two) times daily with a meal.     . finasteride (PROSCAR) 5 MG tablet Take 5 mg by mouth daily.    Marland Kitchen gabapentin (NEURONTIN) 400 MG capsule Take 400 mg by mouth 3 (three) times daily.     Marland Kitchen glimepiride (AMARYL) 2 MG tablet Take 2 mg by mouth daily.    Marland Kitchen lenalidomide (REVLIMID) 25 MG capsule TAKE 1 CAPSULE BY MOUTH DAILY FOR 21 DAYS THEN OFF FOR 7 DAYS 21 capsule PRN  . losartan (COZAAR) 50 MG tablet Take 50 mg by mouth every morning.     . metFORMIN (GLUCOPHAGE-XR) 500 MG 24 hr tablet Take 1,000 mg by mouth 2 (two) times daily.     Marland Kitchen omeprazole (PRILOSEC) 20 MG capsule Take 20 mg by mouth 4 (four) times daily.     . Tamsulosin HCl (FLOMAX) 0.4 MG CAPS Take 0.4 mg by mouth 2 (two) times daily.     . traMADol (ULTRAM) 50 MG tablet Take 50 mg by mouth 4 (four) times daily -  with meals and at bedtime. For pain     No current facility-administered medications for this visit.     Allergies:  Allergies  Allergen Reactions  . Invokana [Canagliflozin] Anaphylaxis    Facial and lip swelling    Past Medical History, Surgical history, Social history, and Family History were reviewed and updated.   Physical Exam: Blood pressure 137/63, pulse (!) 108, temperature 98.3 F (36.8 C), temperature source Oral, resp. rate 17, height 6' (1.829 m), weight 191 lb 14.4 oz (87 kg), SpO2 99 %. ECOG: 1 General appearance: Well-appearing gentleman without distress. Head: Normocephalic, without obvious abnormality no oral thrush or ulcers. Neck: no adenopathy Lymph nodes: Cervical, supraclavicular, and axillary nodes normal. Heart:regular rate and  rhythm, S1, S2 normal, no murmur, click, rub or gallop Lung:chest clear, no wheezing, rales, normal symmetric air entry. Abdomin: soft, non-tender, without masses or organomegaly no rebound or guarding. EXT:no erythema, induration, or nodules Neurological: No deficits noted.  Lab Results: Lab Results  Component Value Date   WBC 6.9 06/30/2016   HGB 13.1 06/30/2016   HCT 40.1 06/30/2016   MCV 94.6 06/30/2016   PLT 216 06/30/2016     Chemistry      Component Value Date/Time   NA 140 05/17/2016 1136   K 3.4 (L) 05/17/2016 1136   CL 103 09/01/2015 1503   CL 105 08/09/2012 0918   CO2 24 05/17/2016 1136   BUN 9.7 05/17/2016 1136   CREATININE 1.1 05/17/2016 1136      Component Value Date/Time   CALCIUM 9.2 05/17/2016 1136   ALKPHOS 137 05/17/2016 1136   AST 12 05/17/2016 1136   ALT 9 05/17/2016 1136   BILITOT 0.28 05/17/2016 1136     Results for James [name], James [name] (MRN [id number]) as of 06/30/2016 13:23  Ref. Range 03/16/2016 15:33 04/20/2016 12:26 05/17/2016 11:36  M Protein SerPl Elph-Mcnc Latest Ref Range: Not Observed g/dL 0.8 (H) 0.5 (H) 0.4 (H)      Impression and Plan:  69 year old with:  1. 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 He has active disease started in June 2017 without bone lesions detected on his skeletal survey.  He is currently on Revlimid at 25 mg daily with dexamethasone without major complications.  His Protein studies continue to show rapid response with M spike down to 0.4 g/dL. This will be repeated today and currently pending. The plan is to repeat his staging studies in one month and decide whether to continue on full dose Revlimid versus maintenance doses. If he has a complete response at that time, I will switch him to maintenance doses of Revlimid at 10 mg daily.  2.  Back pain and hip pain: Related to degenerative disc disease. No major changes in his pain quality.  3. Bone health: Zometa has been offered in the  past and have declined. We will continue to address that with him future visits.  4. Weight loss: Appetite is improved and he is gaining weight.  5. Anemia: Related to multiple myeloma and appears to be resolving at this time. His hemoglobin is back within normal range.  6.  Follow-up: In 1 month to assess his response to therapy.   ZJIRCV,ELFYB, MD 12/1/20171:40 PM

## 2016-07-03 DIAGNOSIS — M5136 Other intervertebral disc degeneration, lumbar region: Secondary | ICD-10-CM | POA: Diagnosis not present

## 2016-07-03 DIAGNOSIS — M5416 Radiculopathy, lumbar region: Secondary | ICD-10-CM | POA: Diagnosis not present

## 2016-07-03 DIAGNOSIS — M5442 Lumbago with sciatica, left side: Secondary | ICD-10-CM | POA: Diagnosis not present

## 2016-07-03 DIAGNOSIS — G8929 Other chronic pain: Secondary | ICD-10-CM | POA: Diagnosis not present

## 2016-07-03 DIAGNOSIS — M5441 Lumbago with sciatica, right side: Secondary | ICD-10-CM | POA: Diagnosis not present

## 2016-07-03 LAB — KAPPA/LAMBDA LIGHT CHAINS
Ig Kappa Free Light Chain: 17.7 mg/L (ref 3.3–19.4)
Ig Lambda Free Light Chain: 13.9 mg/L (ref 5.7–26.3)
Kappa/Lambda FluidC Ratio: 1.27 (ref 0.26–1.65)

## 2016-07-05 LAB — MULTIPLE MYELOMA PANEL, SERUM
Albumin SerPl Elph-Mcnc: 3.7 g/dL (ref 2.9–4.4)
Albumin/Glob SerPl: 1.4 (ref 0.7–1.7)
Alpha 1: 0.2 g/dL (ref 0.0–0.4)
Alpha2 Glob SerPl Elph-Mcnc: 0.9 g/dL (ref 0.4–1.0)
B-Globulin SerPl Elph-Mcnc: 1.2 g/dL (ref 0.7–1.3)
Gamma Glob SerPl Elph-Mcnc: 0.4 g/dL (ref 0.4–1.8)
Globulin, Total: 2.8 g/dL (ref 2.2–3.9)
IgA, Qn, Serum: 364 mg/dL (ref 61–437)
IgG, Qn, Serum: 487 mg/dL — ABNORMAL LOW (ref 700–1600)
IgM, Qn, Serum: 14 mg/dL — ABNORMAL LOW (ref 15–143)
M Protein SerPl Elph-Mcnc: 0.3 g/dL — ABNORMAL HIGH
Total Protein: 6.5 g/dL (ref 6.0–8.5)

## 2016-07-11 DIAGNOSIS — H11153 Pinguecula, bilateral: Secondary | ICD-10-CM | POA: Diagnosis not present

## 2016-07-11 DIAGNOSIS — Z7984 Long term (current) use of oral hypoglycemic drugs: Secondary | ICD-10-CM | POA: Diagnosis not present

## 2016-07-11 DIAGNOSIS — E119 Type 2 diabetes mellitus without complications: Secondary | ICD-10-CM | POA: Diagnosis not present

## 2016-07-11 DIAGNOSIS — H40013 Open angle with borderline findings, low risk, bilateral: Secondary | ICD-10-CM | POA: Diagnosis not present

## 2016-07-11 DIAGNOSIS — Z961 Presence of intraocular lens: Secondary | ICD-10-CM | POA: Diagnosis not present

## 2016-07-11 DIAGNOSIS — H04123 Dry eye syndrome of bilateral lacrimal glands: Secondary | ICD-10-CM | POA: Diagnosis not present

## 2016-07-11 DIAGNOSIS — H18413 Arcus senilis, bilateral: Secondary | ICD-10-CM | POA: Diagnosis not present

## 2016-07-20 ENCOUNTER — Other Ambulatory Visit: Payer: Self-pay | Admitting: Oncology

## 2016-07-20 DIAGNOSIS — C9000 Multiple myeloma not having achieved remission: Secondary | ICD-10-CM

## 2016-07-21 ENCOUNTER — Other Ambulatory Visit: Payer: Self-pay | Admitting: *Deleted

## 2016-07-21 DIAGNOSIS — C9000 Multiple myeloma not having achieved remission: Secondary | ICD-10-CM

## 2016-07-21 MED ORDER — LENALIDOMIDE 25 MG PO CAPS: ORAL_CAPSULE | ORAL | 0 refills | Status: DC

## 2016-07-28 ENCOUNTER — Other Ambulatory Visit (HOSPITAL_BASED_OUTPATIENT_CLINIC_OR_DEPARTMENT_OTHER): Payer: Commercial Managed Care - HMO

## 2016-07-28 ENCOUNTER — Ambulatory Visit (HOSPITAL_COMMUNITY)
Admission: RE | Admit: 2016-07-28 | Discharge: 2016-07-28 | Disposition: A | Discharge status: Home / Self Care | Payer: Commercial Managed Care - HMO | Source: Ambulatory Visit | Attending: Oncology | Admitting: Oncology

## 2016-07-28 DIAGNOSIS — M4856XA Collapsed vertebra, not elsewhere classified, lumbar region, initial encounter for fracture: Secondary | ICD-10-CM | POA: Diagnosis not present

## 2016-07-28 DIAGNOSIS — R937 Abnormal findings on diagnostic imaging of other parts of musculoskeletal system: Secondary | ICD-10-CM | POA: Diagnosis not present

## 2016-07-28 DIAGNOSIS — C9000 Multiple myeloma not having achieved remission: Secondary | ICD-10-CM | POA: Insufficient documentation

## 2016-07-28 LAB — COMPREHENSIVE METABOLIC PANEL
ALT: 13 U/L (ref 0–55)
AST: 10 U/L (ref 5–34)
Albumin: 3.8 g/dL (ref 3.5–5.0)
Alkaline Phosphatase: 142 U/L (ref 40–150)
Anion Gap: 11 mEq/L (ref 3–11)
BUN: 6.8 mg/dL — ABNORMAL LOW (ref 7.0–26.0)
CO2: 28 mEq/L (ref 22–29)
Calcium: 9.2 mg/dL (ref 8.4–10.4)
Chloride: 102 mEq/L (ref 98–109)
Creatinine: 0.9 mg/dL (ref 0.7–1.3)
EGFR: >90 mL/min/{1.73_m2} (ref >90)
Glucose: 175 mg/dl — ABNORMAL HIGH (ref 70–140)
Potassium: 3.3 mEq/L — ABNORMAL LOW (ref 3.5–5.1)
Sodium: 141 mEq/L (ref 136–145)
Total Bilirubin: 0.53 mg/dL (ref 0.20–1.20)
Total Protein: 7.1 g/dL (ref 6.4–8.3)

## 2016-07-28 LAB — CBC WITH DIFFERENTIAL/PLATELET
BASO%: 0.5 % (ref 0.0–2.0)
Basophils Absolute: 0 10*3/uL (ref 0.0–0.1)
EOS%: 3.3 % (ref 0.0–7.0)
Eosinophils Absolute: 0.2 10*3/uL (ref 0.0–0.5)
HCT: 37.9 % — ABNORMAL LOW (ref 38.4–49.9)
HGB: 12.7 g/dL — ABNORMAL LOW (ref 13.0–17.1)
LYMPH%: 22.2 % (ref 14.0–49.0)
MCH: 31.1 pg (ref 27.2–33.4)
MCHC: 33.4 g/dL (ref 32.0–36.0)
MCV: 92.9 fL (ref 79.3–98.0)
MONO#: 0.7 10*3/uL (ref 0.1–0.9)
MONO%: 12.1 % (ref 0.0–14.0)
NEUT#: 3.5 10*3/uL (ref 1.5–6.5)
NEUT%: 61.9 % (ref 39.0–75.0)
Platelets: 265 10*3/uL (ref 140–400)
RBC: 4.08 10*6/uL — ABNORMAL LOW (ref 4.20–5.82)
RDW: 16.3 % — ABNORMAL HIGH (ref 11.0–14.6)
WBC: 5.7 10*3/uL (ref 4.0–10.3)
lymph#: 1.3 10*3/uL (ref 0.9–3.3)

## 2016-08-01 LAB — KAPPA/LAMBDA LIGHT CHAINS
Ig Kappa Free Light Chain: 23.2 mg/L — ABNORMAL HIGH (ref 3.3–19.4)
Ig Lambda Free Light Chain: 16.1 mg/L (ref 5.7–26.3)
Kappa/Lambda FluidC Ratio: 1.44 (ref 0.26–1.65)

## 2016-08-02 LAB — MULTIPLE MYELOMA PANEL, SERUM
Albumin SerPl Elph-Mcnc: 3.8 g/dL (ref 2.9–4.4)
Albumin/Glob SerPl: 1.4 (ref 0.7–1.7)
Alpha 1: 0.2 g/dL (ref 0.0–0.4)
Alpha2 Glob SerPl Elph-Mcnc: 0.9 g/dL (ref 0.4–1.0)
B-Globulin SerPl Elph-Mcnc: 1.2 g/dL (ref 0.7–1.3)
Gamma Glob SerPl Elph-Mcnc: 0.5 g/dL (ref 0.4–1.8)
Globulin, Total: 2.8 g/dL (ref 2.2–3.9)
IgA, Qn, Serum: 374 mg/dL (ref 61–437)
IgG, Qn, Serum: 631 mg/dL — ABNORMAL LOW (ref 700–1600)
IgM, Qn, Serum: 13 mg/dL — ABNORMAL LOW (ref 15–143)
M Protein SerPl Elph-Mcnc: 0.4 g/dL — ABNORMAL HIGH
Total Protein: 6.6 g/dL (ref 6.0–8.5)

## 2016-08-03 ENCOUNTER — Ambulatory Visit (HOSPITAL_BASED_OUTPATIENT_CLINIC_OR_DEPARTMENT_OTHER): Payer: Commercial Managed Care - HMO | Admitting: Oncology

## 2016-08-03 ENCOUNTER — Telehealth: Payer: Self-pay | Admitting: Oncology

## 2016-08-03 VITALS — BP 166/74 | HR 97 | Resp 18 | Ht 72.0 in | Wt 196.8 lb

## 2016-08-03 DIAGNOSIS — C9000 Multiple myeloma not having achieved remission: Secondary | ICD-10-CM | POA: Diagnosis not present

## 2016-08-03 DIAGNOSIS — Z23 Encounter for immunization: Secondary | ICD-10-CM | POA: Diagnosis not present

## 2016-08-03 DIAGNOSIS — D630 Anemia in neoplastic disease: Secondary | ICD-10-CM

## 2016-08-03 MED ORDER — INFLUENZA VAC SPLIT QUAD 0.5 ML IM SUSY
0.5000 mL | PREFILLED_SYRINGE | Freq: Once | INTRAMUSCULAR | Status: AC
  Administered 2016-08-03: 0.5 mL via INTRAMUSCULAR
  Filled 2016-08-03: qty 0.5

## 2016-08-03 NOTE — Telephone Encounter (Signed)
Appointments scheduled per 08/03/16 los. Patient was given a copy of the appointment schedule and AVS report per 08/03/16 los.

## 2016-08-03 NOTE — Progress Notes (Signed)
Hematology and Oncology Follow Up Visit  James [name] [id number] [birth year] 71 y.o. 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 Active myeloma diagnosed in June 2017 with an M spike of 3 g/dL and an IgA level of 3700. He was found to have mild anemia and bone marrow involvement. Bone marrow biopsy in April 2016 showed plasma cell myeloma with percentage ranging between 14-30%. Skeletal survey in June 2017 showed no bone lesions  Current therapy:  Revlimid and dexamethasone started on January 28 2016. He is currently receiving Revlimid at 25 mg daily for 21 days with dexamethasone at 20 mg weekly.  Interim History:  Mr. Wease returns today for a follow up visit. Since the last visit, he reports no major changes in his health. He continues to tolerate Revlimid and dexamethasone without major complications. He does report mild fatigue and nausea but no vomiting. He denied any diarrhea or skin rash. He denied any pathological fractures or falls.   His appetite remained adequate and his weight is stable. He still ambulating with the help of a cane and has not reported any falls or syncope. He continues to have generalized pain which has not changed. He denied any pathological fractures or falls.   He does not report any headaches, blurry vision, syncope or seizures. He does not report any fevers or chills or sweats. Does not report any cough or hemoptysis or hematemesis. He does not report any chest pain palpitation or orthopnea. He reports no nausea or vomiting or abdominal pain. Does not report any frequency urgency or hesitancy. He does not report any lymphadenopathy or petechiae. The remaining review of systems unremarkable.    Medications: I have reviewed the patient's current medications.  Current Outpatient Prescriptions  Medication Sig Dispense Refill  . amLODipine (NORVASC) 10 MG  tablet Take 10 mg by mouth every morning.     Marland Kitchen dexamethasone (DECADRON) 4 MG tablet Take 5 tablets weekly. 40 tablet 3  . famotidine (PEPCID) 20 MG tablet Take 1 tablet (20 mg total) by mouth 2 (two) times daily. 10 tablet 0  . fentaNYL (DURAGESIC - DOSED MCG/HR) 50 MCG/HR Place onto the skin.    . ferrous sulfate 325 (65 FE) MG tablet Take 325 mg by mouth 2 (two) times daily with a meal.     . finasteride (PROSCAR) 5 MG tablet Take 5 mg by mouth daily.    Marland Kitchen gabapentin (NEURONTIN) 400 MG capsule Take 400 mg by mouth 3 (three) times daily.     Marland Kitchen glimepiride (AMARYL) 2 MG tablet Take 2 mg by mouth daily.    Marland Kitchen lenalidomide (REVLIMID) 25 MG capsule TAKE 1 CAPSULE BY MOUTH DAILY FOR 21 DAYS THEN OFF FOR 7 DAYS 21 capsule 0  . losartan (COZAAR) 50 MG tablet Take 50 mg by mouth every morning.     . metFORMIN (GLUCOPHAGE-XR) 500 MG 24 hr tablet Take 1,000 mg by mouth 2 (two) times daily.     Marland Kitchen omeprazole (PRILOSEC) 20 MG capsule Take 20 mg by mouth 4 (four) times daily.     . Tamsulosin HCl (FLOMAX) 0.4 MG CAPS Take 0.4 mg by mouth 2 (two) times daily.     . traMADol (ULTRAM) 50 MG tablet Take 50 mg by mouth 4 (four) times daily -  with meals and at bedtime. For pain     No current facility-administered medications for this visit.  Allergies:  Allergies  Allergen Reactions  . Invokana [Canagliflozin] Anaphylaxis    Facial and lip swelling    Past Medical History, Surgical history, Social history, and Family History were reviewed and updated.   Physical Exam: Blood pressure (!) 166/74, pulse 97, resp. rate 18, height 6' (1.829 m), weight 196 lb 12.8 oz (89.3 kg), SpO2 100 %. ECOG: 1 General appearance: Alert, awake gentleman without distress. Head: Normocephalic, without obvious abnormality no oral thrush or ulcers. Neck: no adenopathy Lymph nodes: Cervical, supraclavicular, and axillary nodes normal. Heart:regular rate and rhythm, S1, S2 normal, no murmur, click, rub or  gallop Lung:chest clear, no wheezing, rales, normal symmetric air entry. Abdomin: soft, non-tender, without masses or organomegaly no shifting dullness or ascites. EXT:no erythema, induration, or nodules Neurological: No deficits noted.  Lab Results: Lab Results  Component Value Date   WBC 5.7 07/28/2016   HGB 12.7 (L) 07/28/2016   HCT 37.9 (L) 07/28/2016   MCV 92.9 07/28/2016   PLT 265 07/28/2016     Chemistry      Component Value Date/Time   NA 141 07/28/2016 1134   K 3.3 (L) 07/28/2016 1134   CL 103 09/01/2015 1503   CL 105 08/09/2012 0918   CO2 28 07/28/2016 1134   BUN 6.8 (L) 07/28/2016 1134   CREATININE 0.9 07/28/2016 1134      Component Value Date/Time   CALCIUM 9.2 07/28/2016 1134   ALKPHOS 142 07/28/2016 1134   AST 10 07/28/2016 1134   ALT 13 07/28/2016 1134   BILITOT 0.53 07/28/2016 1134     Results for AVERY, KLINGBEIL (MRN [id number]) as of 08/03/2016 14:20  Ref. Range 07/28/2016 11:34  M Protein SerPl Elph-Mcnc Latest Ref Range: Not Observed g/dL 0.4 (H)   Results for HARPER, SMOKER (MRN [id number]) as of 08/03/2016 14:20  Ref. Range 07/28/2016 11:34  IgG (Immunoglobin G), Serum Latest Ref Range: 700 - 1,600 mg/dL 631 (L)  IgA/Immunoglobulin A, Serum Latest Ref Range: 61 - 437 mg/dL 374  Ig Kappa Free Light Chain Latest Ref Range: 3.3 - 19.4 mg/L 23.2 (H)  Ig Lambda Free Light Chain Latest Ref Range: 5.7 - 26.3 mg/L 16.1  Kappa/Lambda FluidC Ratio Latest Ref Range: 0.26 - 1.65  1.44     Impression and Plan:  71 year old with:  1. 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 He has active disease started in June 2017 without bone lesions detected on his skeletal survey.  He is currently on Revlimid at 25 mg daily with dexamethasone without major complications.  His Protein studies obtained on 07/28/2016 were reviewed today and continues to show positive response with near resolution of his M spike. His skeletal survey did not show  any clear-cut evidence of progression of disease.  The risks and benefits of continuing Revlimid and dexamethasone were discussed at this point and he opted to stop this medication at this time for treatment holiday. I anticipate resuming Revlimid and dexamethasone if his protein studies starts to rise.  2.  Back pain and hip pain: Related to degenerative disc disease. No major changes in his pain quality.  3. Bone health: Zometa has been offered in the past and have declined. We will continue to address that with him in the future.  4. Weight loss: Appetite has improved and gained weight.  5. Anemia: Related to multiple myeloma and appears to be resolving at this time. His hemoglobin is back within normal range.  6.  Follow-up: In 6  weeks to repeat his protein studies.   Zola Button, MD 1/4/20184:17 PM

## 2016-08-10 DIAGNOSIS — E1165 Type 2 diabetes mellitus with hyperglycemia: Secondary | ICD-10-CM | POA: Diagnosis not present

## 2016-08-10 DIAGNOSIS — G629 Polyneuropathy, unspecified: Secondary | ICD-10-CM | POA: Diagnosis not present

## 2016-08-10 DIAGNOSIS — Z7984 Long term (current) use of oral hypoglycemic drugs: Secondary | ICD-10-CM | POA: Diagnosis not present

## 2016-08-10 DIAGNOSIS — E1149 Type 2 diabetes mellitus with other diabetic neurological complication: Secondary | ICD-10-CM | POA: Diagnosis not present

## 2016-08-10 DIAGNOSIS — I10 Essential (primary) hypertension: Secondary | ICD-10-CM | POA: Diagnosis not present

## 2016-08-10 DIAGNOSIS — E782 Mixed hyperlipidemia: Secondary | ICD-10-CM | POA: Diagnosis not present

## 2016-08-10 DIAGNOSIS — E1129 Type 2 diabetes mellitus with other diabetic kidney complication: Secondary | ICD-10-CM | POA: Diagnosis not present

## 2016-08-10 DIAGNOSIS — R809 Proteinuria, unspecified: Secondary | ICD-10-CM | POA: Diagnosis not present

## 2016-08-12 ENCOUNTER — Other Ambulatory Visit: Payer: Self-pay | Admitting: Oncology

## 2016-08-12 DIAGNOSIS — C9000 Multiple myeloma not having achieved remission: Secondary | ICD-10-CM

## 2016-08-24 DIAGNOSIS — M79674 Pain in right toe(s): Secondary | ICD-10-CM | POA: Diagnosis not present

## 2016-08-24 DIAGNOSIS — L819 Disorder of pigmentation, unspecified: Secondary | ICD-10-CM | POA: Diagnosis not present

## 2016-09-04 DIAGNOSIS — R609 Edema, unspecified: Secondary | ICD-10-CM | POA: Diagnosis not present

## 2016-09-04 DIAGNOSIS — R0600 Dyspnea, unspecified: Secondary | ICD-10-CM | POA: Diagnosis not present

## 2016-09-07 DIAGNOSIS — R609 Edema, unspecified: Secondary | ICD-10-CM | POA: Diagnosis not present

## 2016-09-13 ENCOUNTER — Other Ambulatory Visit (HOSPITAL_BASED_OUTPATIENT_CLINIC_OR_DEPARTMENT_OTHER): Payer: Medicare HMO

## 2016-09-13 DIAGNOSIS — C9000 Multiple myeloma not having achieved remission: Secondary | ICD-10-CM

## 2016-09-13 DIAGNOSIS — M5416 Radiculopathy, lumbar region: Secondary | ICD-10-CM | POA: Diagnosis not present

## 2016-09-13 LAB — COMPREHENSIVE METABOLIC PANEL
ALT: 7 U/L (ref 0–55)
AST: 12 U/L (ref 5–34)
Albumin: 3.8 g/dL (ref 3.5–5.0)
Alkaline Phosphatase: 112 U/L (ref 40–150)
Anion Gap: 12 mEq/L — ABNORMAL HIGH (ref 3–11)
BUN: 9.2 mg/dL (ref 7.0–26.0)
CO2: 28 mEq/L (ref 22–29)
Calcium: 9.4 mg/dL (ref 8.4–10.4)
Chloride: 101 mEq/L (ref 98–109)
Creatinine: 0.9 mg/dL (ref 0.7–1.3)
EGFR: >90 mL/min/{1.73_m2} (ref >90)
Glucose: 170 mg/dl — ABNORMAL HIGH (ref 70–140)
Potassium: 3.5 mEq/L (ref 3.5–5.1)
Sodium: 141 mEq/L (ref 136–145)
Total Bilirubin: 0.45 mg/dL (ref 0.20–1.20)
Total Protein: 7.4 g/dL (ref 6.4–8.3)

## 2016-09-13 LAB — CBC WITH DIFFERENTIAL/PLATELET
BASO%: 1.8 % (ref 0.0–2.0)
Basophils Absolute: 0.1 10*3/uL (ref 0.0–0.1)
EOS%: 1.1 % (ref 0.0–7.0)
Eosinophils Absolute: 0.1 10*3/uL (ref 0.0–0.5)
HCT: 35.1 % — ABNORMAL LOW (ref 38.4–49.9)
HGB: 11.9 g/dL — ABNORMAL LOW (ref 13.0–17.1)
LYMPH%: 26 % (ref 14.0–49.0)
MCH: 29.8 pg (ref 27.2–33.4)
MCHC: 33.9 g/dL (ref 32.0–36.0)
MCV: 88 fL (ref 79.3–98.0)
MONO#: 0.4 10*3/uL (ref 0.1–0.9)
MONO%: 8.2 % (ref 0.0–14.0)
NEUT#: 2.9 10*3/uL (ref 1.5–6.5)
NEUT%: 62.9 % (ref 39.0–75.0)
Platelets: 222 10*3/uL (ref 140–400)
RBC: 3.99 10*6/uL — ABNORMAL LOW (ref 4.20–5.82)
RDW: 15.2 % — ABNORMAL HIGH (ref 11.0–14.6)
WBC: 4.5 10*3/uL (ref 4.0–10.3)
lymph#: 1.2 10*3/uL (ref 0.9–3.3)

## 2016-09-14 LAB — KAPPA/LAMBDA LIGHT CHAINS
Ig Kappa Free Light Chain: 21.7 mg/L — ABNORMAL HIGH (ref 3.3–19.4)
Ig Lambda Free Light Chain: 15.8 mg/L (ref 5.7–26.3)
Kappa/Lambda FluidC Ratio: 1.37 (ref 0.26–1.65)

## 2016-09-15 DIAGNOSIS — G8929 Other chronic pain: Secondary | ICD-10-CM | POA: Diagnosis not present

## 2016-09-15 DIAGNOSIS — M5441 Lumbago with sciatica, right side: Secondary | ICD-10-CM | POA: Diagnosis not present

## 2016-09-15 DIAGNOSIS — M5442 Lumbago with sciatica, left side: Secondary | ICD-10-CM | POA: Diagnosis not present

## 2016-09-17 LAB — MULTIPLE MYELOMA PANEL, SERUM
Albumin SerPl Elph-Mcnc: 3.4 g/dL (ref 2.9–4.4)
Albumin/Glob SerPl: 1.1 (ref 0.7–1.7)
Alpha 1: 0.3 g/dL (ref 0.0–0.4)
Alpha2 Glob SerPl Elph-Mcnc: 0.9 g/dL (ref 0.4–1.0)
B-Globulin SerPl Elph-Mcnc: 1.4 g/dL — ABNORMAL HIGH (ref 0.7–1.3)
Gamma Glob SerPl Elph-Mcnc: 0.7 g/dL (ref 0.4–1.8)
Globulin, Total: 3.2 g/dL (ref 2.2–3.9)
IgA, Qn, Serum: 563 mg/dL — ABNORMAL HIGH (ref 61–437)
IgG, Qn, Serum: 729 mg/dL (ref 700–1600)
IgM, Qn, Serum: 10 mg/dL — ABNORMAL LOW (ref 15–143)
M Protein SerPl Elph-Mcnc: 0.4 g/dL — ABNORMAL HIGH
Total Protein: 6.6 g/dL (ref 6.0–8.5)

## 2016-09-21 ENCOUNTER — Ambulatory Visit (HOSPITAL_BASED_OUTPATIENT_CLINIC_OR_DEPARTMENT_OTHER): Payer: Medicare HMO | Admitting: Oncology

## 2016-09-21 ENCOUNTER — Telehealth: Payer: Self-pay | Admitting: Oncology

## 2016-09-21 ENCOUNTER — Encounter: Payer: Self-pay | Admitting: Oncology

## 2016-09-21 VITALS — BP 160/91 | HR 102 | Temp 98.3°F | Resp 20 | Wt 193.1 lb

## 2016-09-21 DIAGNOSIS — D649 Anemia, unspecified: Secondary | ICD-10-CM | POA: Diagnosis not present

## 2016-09-21 DIAGNOSIS — C9001 Multiple myeloma in remission: Secondary | ICD-10-CM | POA: Diagnosis not present

## 2016-09-21 NOTE — Telephone Encounter (Signed)
Gave patient avs report and appointments for April  °

## 2016-09-21 NOTE — Progress Notes (Signed)
Hematology and Oncology Follow Up Visit  James [name] [id number] Mar 28, 1946 71 y.o. 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 Active myeloma diagnosed in June 2017 with an M spike of 3 g/dL and an IgA level of 3700. He was found to have mild anemia and bone marrow involvement. Bone marrow biopsy in April 2016 showed plasma cell myeloma with percentage ranging between 14-30%. Skeletal survey in June 2017 showed no bone lesions  Prior therapy: Revlimid and dexamethasone started on January 28 2016. Revlimid at 25 mg daily for 21 days with dexamethasone at 20 mg weekly. Therapy discontinued in January 2018. He achieved a near complete response to therapy.  Current therapy: Observation and surveillance.   Interim History:  James. [name] returns today for a follow up visit. Since the last visit, he reports feeling slightly improved and his overall health. His mobility has been improved without the help of a walker or cane. He denied any falls or syncope. He denied any increased pain or pathological fractures. His appetite remained adequate and his weight is stable.  He continues to have generalized pain which has not changed. He denied any recent infections or hospitalizations.   He does not report any headaches, blurry vision, syncope or seizures. He does not report any fevers or chills or sweats. Does not report any cough or hemoptysis or hematemesis. He does not report any chest pain palpitation or orthopnea. He reports no nausea or vomiting or abdominal pain. Does not report any frequency urgency or hesitancy. He does not report any lymphadenopathy or petechiae. The remaining review of systems unremarkable.    Medications: I have reviewed the patient's current medications.  Current Outpatient Prescriptions  Medication Sig Dispense Refill  . amLODipine (NORVASC) 10 MG tablet Take 10 mg by  mouth every morning.     . famotidine (PEPCID) 20 MG tablet Take 1 tablet (20 mg total) by mouth 2 (two) times daily. 10 tablet 0  . fentaNYL (DURAGESIC - DOSED MCG/HR) 50 MCG/HR Place onto the skin.    . ferrous sulfate 325 (65 FE) MG tablet Take 325 mg by mouth 2 (two) times daily with a meal.     . finasteride (PROSCAR) 5 MG tablet Take 5 mg by mouth daily.    Marland Kitchen gabapentin (NEURONTIN) 400 MG capsule Take 400 mg by mouth 3 (three) times daily.     Marland Kitchen glimepiride (AMARYL) 2 MG tablet Take 2 mg by mouth daily.    Marland Kitchen HYDROcodone-acetaminophen (NORCO) 10-325 MG tablet Take 1 tablet by mouth.    . losartan (COZAAR) 50 MG tablet Take 50 mg by mouth every morning.     . metFORMIN (GLUCOPHAGE-XR) 500 MG 24 hr tablet Take 1,000 mg by mouth 2 (two) times daily.     Marland Kitchen omeprazole (PRILOSEC) 20 MG capsule Take 20 mg by mouth 4 (four) times daily.     . Tamsulosin HCl (FLOMAX) 0.4 MG CAPS Take 0.4 mg by mouth 2 (two) times daily.     . traMADol (ULTRAM) 50 MG tablet Take 50 mg by mouth 4 (four) times daily -  with meals and at bedtime. For pain    . lenalidomide (REVLIMID) 25 MG capsule TAKE 1 CAPSULE BY MOUTH DAILY FOR 21 DAYS THEN OFF FOR 7 DAYS (Patient not taking: Reported on 09/21/2016) 21 capsule 0   No current facility-administered medications for this visit.     Allergies:  Allergies  Allergen Reactions  . Invokana [Canagliflozin] Anaphylaxis    Facial and lip swelling    Past Medical History, Surgical history, Social history, and Family History were reviewed and updated.   Physical Exam: Blood pressure (!) 160/91, pulse (!) 102, temperature 98.3 F (36.8 C), temperature source Oral, resp. rate 20, weight 193 lb 1.6 oz (87.6 kg). ECOG: 1 General appearance: Well-appearing gentleman appeared comfortable. Head: Normocephalic, without obvious abnormality no oral ulcers or lesions. Neck: no adenopathy Lymph nodes: Cervical, supraclavicular, and axillary nodes normal. Heart:regular rate and  rhythm, S1, S2 normal, no murmur, click, rub or gallop Lung:chest clear, no wheezing, rales, normal symmetric air entry. Abdomin: soft, non-tender, without masses or organomegaly no rebound or guarding. EXT:no erythema, induration, or nodules Neurological: No deficits noted.  Lab Results: Lab Results  Component Value Date   WBC 4.5 09/13/2016   HGB 11.9 (L) 09/13/2016   HCT 35.1 (L) 09/13/2016   MCV 88.0 09/13/2016   PLT 222 09/13/2016     Chemistry      Component Value Date/Time   NA 141 09/13/2016 1453   K 3.5 09/13/2016 1453   CL 103 09/01/2015 1503   CL 105 08/09/2012 0918   CO2 28 09/13/2016 1453   BUN 9.2 09/13/2016 1453   CREATININE 0.9 09/13/2016 1453      Component Value Date/Time   CALCIUM 9.4 09/13/2016 1453   ALKPHOS 112 09/13/2016 1453   AST 12 09/13/2016 1453   ALT 7 09/13/2016 1453   BILITOT 0.45 09/13/2016 1453      Results for James, [name] (MRN [id number]) as of 09/21/2016 14:08  Ref. Range 09/13/2016 14:53  M Protein SerPl Elph-Mcnc Latest Ref Range: Not Observed g/dL 0.4 (H)  Results for James, [name] (MRN [id number]) as of 09/21/2016 14:08  Ref. Range 09/13/2016 14:53  IgG (Immunoglobin G), Serum Latest Ref Range: 700 - 1,600 mg/dL 729  IgA/Immunoglobulin A, Serum Latest Ref Range: 61 - 437 mg/dL 563 (H)  Ig Kappa Free Light Chain Latest Ref Range: 3.3 - 19.4 mg/L 21.7 (H)  Ig Lambda Free Light Chain Latest Ref Range: 5.7 - 26.3 mg/L 15.8  Kappa/Lambda FluidC Ratio Latest Ref Range: 0.26 - 1.65  1.37    Impression and Plan:  71 year old with:  1. 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 He has active disease started in June 2017 without bone lesions detected on his skeletal survey.  He is S/P Revlimid at 25 mg daily with dexamethasone completed in January 2018 with a complete response.  His Protein studies obtained on 09/13/2016 was personally reviewed today and discussed with the patient. He continues to have  excellent response without any evidence to suggest relapse disease. His M spike remains very low and his light chain ratio continues to be within normal range.  The plan is to continue with observation and surveillance and reinitiate therapy at the signs of relapse. He'll have a repeat skeletal survey in June 2018.  2.  Back pain and hip pain: Related to degenerative disc disease. No evidence of metastatic disease to the bone.  3. Bone health: Zometa has been offered in the past and have declined.   4. Weight loss: Appetite has improved and gained weight.  5. Anemia: Appears to be very mild reasonably stable at this time.  6.  Follow-up: In 8 weeks to repeat his protein studies.   Zola Button, MD 2/22/20182:03 PM

## 2016-10-04 DIAGNOSIS — Z79899 Other long term (current) drug therapy: Secondary | ICD-10-CM | POA: Diagnosis not present

## 2016-10-04 DIAGNOSIS — E1142 Type 2 diabetes mellitus with diabetic polyneuropathy: Secondary | ICD-10-CM | POA: Diagnosis not present

## 2016-10-04 DIAGNOSIS — M5416 Radiculopathy, lumbar region: Secondary | ICD-10-CM | POA: Diagnosis not present

## 2016-10-04 DIAGNOSIS — G8929 Other chronic pain: Secondary | ICD-10-CM | POA: Diagnosis not present

## 2016-10-04 DIAGNOSIS — M5442 Lumbago with sciatica, left side: Secondary | ICD-10-CM | POA: Diagnosis not present

## 2016-10-04 DIAGNOSIS — M5441 Lumbago with sciatica, right side: Secondary | ICD-10-CM | POA: Diagnosis not present

## 2016-10-04 DIAGNOSIS — M5136 Other intervertebral disc degeneration, lumbar region: Secondary | ICD-10-CM | POA: Diagnosis not present

## 2016-11-10 ENCOUNTER — Ambulatory Visit
Admission: RE | Admit: 2016-11-10 | Discharge: 2016-11-10 | Disposition: A | Discharge status: Home / Self Care | Payer: Medicare HMO | Source: Ambulatory Visit | Attending: Physician Assistant | Admitting: Physician Assistant

## 2016-11-10 ENCOUNTER — Other Ambulatory Visit: Payer: Self-pay | Admitting: Physician Assistant

## 2016-11-10 DIAGNOSIS — M160 Bilateral primary osteoarthritis of hip: Secondary | ICD-10-CM | POA: Diagnosis not present

## 2016-11-10 DIAGNOSIS — W19XXXA Unspecified fall, initial encounter: Secondary | ICD-10-CM

## 2016-11-10 DIAGNOSIS — M545 Low back pain: Secondary | ICD-10-CM | POA: Diagnosis not present

## 2016-11-10 DIAGNOSIS — M5136 Other intervertebral disc degeneration, lumbar region: Secondary | ICD-10-CM | POA: Diagnosis not present

## 2016-11-14 DIAGNOSIS — M79672 Pain in left foot: Secondary | ICD-10-CM | POA: Diagnosis not present

## 2016-11-15 ENCOUNTER — Other Ambulatory Visit: Payer: Self-pay | Admitting: Physician Assistant

## 2016-11-15 ENCOUNTER — Other Ambulatory Visit (HOSPITAL_BASED_OUTPATIENT_CLINIC_OR_DEPARTMENT_OTHER): Payer: Medicare HMO

## 2016-11-15 DIAGNOSIS — C9001 Multiple myeloma in remission: Secondary | ICD-10-CM | POA: Diagnosis not present

## 2016-11-15 DIAGNOSIS — M545 Low back pain: Secondary | ICD-10-CM

## 2016-11-15 LAB — CBC WITH DIFFERENTIAL/PLATELET
BASO%: 0.4 % (ref 0.0–2.0)
Basophils Absolute: 0 10*3/uL (ref 0.0–0.1)
EOS%: 1.1 % (ref 0.0–7.0)
Eosinophils Absolute: 0.1 10*3/uL (ref 0.0–0.5)
HCT: 35.5 % — ABNORMAL LOW (ref 38.4–49.9)
HGB: 11.8 g/dL — ABNORMAL LOW (ref 13.0–17.1)
LYMPH%: 22 % (ref 14.0–49.0)
MCH: 29.8 pg (ref 27.2–33.4)
MCHC: 33.2 g/dL (ref 32.0–36.0)
MCV: 89.6 fL (ref 79.3–98.0)
MONO#: 0.5 10*3/uL (ref 0.1–0.9)
MONO%: 10.4 % (ref 0.0–14.0)
NEUT#: 3.2 10*3/uL (ref 1.5–6.5)
NEUT%: 66.1 % (ref 39.0–75.0)
Platelets: 257 10*3/uL (ref 140–400)
RBC: 3.96 10*6/uL — ABNORMAL LOW (ref 4.20–5.82)
RDW: 15.1 % — ABNORMAL HIGH (ref 11.0–14.6)
WBC: 4.9 10*3/uL (ref 4.0–10.3)
lymph#: 1.1 10*3/uL (ref 0.9–3.3)

## 2016-11-15 LAB — COMPREHENSIVE METABOLIC PANEL
ALT: 7 U/L (ref 0–55)
AST: 12 U/L (ref 5–34)
Albumin: 3.8 g/dL (ref 3.5–5.0)
Alkaline Phosphatase: 96 U/L (ref 40–150)
Anion Gap: 12 mEq/L — ABNORMAL HIGH (ref 3–11)
BUN: 8.5 mg/dL (ref 7.0–26.0)
CO2: 28 mEq/L (ref 22–29)
Calcium: 9.9 mg/dL (ref 8.4–10.4)
Chloride: 103 mEq/L (ref 98–109)
Creatinine: 1 mg/dL (ref 0.7–1.3)
EGFR: 88 mL/min/{1.73_m2} — ABNORMAL LOW (ref >90)
Glucose: 123 mg/dl (ref 70–140)
Potassium: 3.4 mEq/L — ABNORMAL LOW (ref 3.5–5.1)
Sodium: 143 mEq/L (ref 136–145)
Total Bilirubin: 0.32 mg/dL (ref 0.20–1.20)
Total Protein: 7.6 g/dL (ref 6.4–8.3)

## 2016-11-16 DIAGNOSIS — I2510 Atherosclerotic heart disease of native coronary artery without angina pectoris: Secondary | ICD-10-CM | POA: Diagnosis not present

## 2016-11-16 DIAGNOSIS — J449 Chronic obstructive pulmonary disease, unspecified: Secondary | ICD-10-CM | POA: Diagnosis not present

## 2016-11-16 DIAGNOSIS — R0602 Shortness of breath: Secondary | ICD-10-CM | POA: Diagnosis not present

## 2016-11-16 DIAGNOSIS — Z7901 Long term (current) use of anticoagulants: Secondary | ICD-10-CM | POA: Diagnosis not present

## 2016-11-16 DIAGNOSIS — I481 Persistent atrial fibrillation: Secondary | ICD-10-CM | POA: Diagnosis not present

## 2016-11-16 DIAGNOSIS — I110 Hypertensive heart disease with heart failure: Secondary | ICD-10-CM | POA: Diagnosis not present

## 2016-11-16 DIAGNOSIS — I5042 Chronic combined systolic (congestive) and diastolic (congestive) heart failure: Secondary | ICD-10-CM | POA: Diagnosis not present

## 2016-11-16 DIAGNOSIS — N183 Chronic kidney disease, stage 3 (moderate): Secondary | ICD-10-CM | POA: Diagnosis not present

## 2016-11-16 LAB — KAPPA/LAMBDA LIGHT CHAINS
Ig Kappa Free Light Chain: 20 mg/L — ABNORMAL HIGH (ref 3.3–19.4)
Ig Lambda Free Light Chain: 15 mg/L (ref 5.7–26.3)
Kappa/Lambda FluidC Ratio: 1.33 (ref 0.26–1.65)

## 2016-11-20 LAB — MULTIPLE MYELOMA PANEL, SERUM
Albumin SerPl Elph-Mcnc: 3.9 g/dL (ref 2.9–4.4)
Albumin/Glob SerPl: 1.3 (ref 0.7–1.7)
Alpha 1: 0.2 g/dL (ref 0.0–0.4)
Alpha2 Glob SerPl Elph-Mcnc: 0.7 g/dL (ref 0.4–1.0)
B-Globulin SerPl Elph-Mcnc: 1.6 g/dL — ABNORMAL HIGH (ref 0.7–1.3)
Gamma Glob SerPl Elph-Mcnc: 0.6 g/dL (ref 0.4–1.8)
Globulin, Total: 3.1 g/dL (ref 2.2–3.9)
IgA, Qn, Serum: 1089 mg/dL — ABNORMAL HIGH (ref 61–437)
IgG, Qn, Serum: 697 mg/dL — ABNORMAL LOW (ref 700–1600)
IgM, Qn, Serum: 10 mg/dL — ABNORMAL LOW (ref 15–143)
M Protein SerPl Elph-Mcnc: 0.5 g/dL — ABNORMAL HIGH
Total Protein: 7 g/dL (ref 6.0–8.5)

## 2016-11-22 ENCOUNTER — Ambulatory Visit (HOSPITAL_BASED_OUTPATIENT_CLINIC_OR_DEPARTMENT_OTHER): Payer: Medicare HMO | Admitting: Oncology

## 2016-11-22 ENCOUNTER — Telehealth: Payer: Self-pay | Admitting: Oncology

## 2016-11-22 VITALS — BP 120/70 | HR 104 | Temp 98.4°F | Resp 18 | Ht 72.0 in | Wt 200.4 lb

## 2016-11-22 DIAGNOSIS — C9000 Multiple myeloma not having achieved remission: Secondary | ICD-10-CM | POA: Diagnosis not present

## 2016-11-22 DIAGNOSIS — D649 Anemia, unspecified: Secondary | ICD-10-CM

## 2016-11-22 DIAGNOSIS — C9001 Multiple myeloma in remission: Secondary | ICD-10-CM

## 2016-11-22 NOTE — Telephone Encounter (Signed)
Appointments scheduled per 11/22/16 los. Patient was given a copy of the AVS report and schedule per 11/22/16 los.

## 2016-11-22 NOTE — Progress Notes (Signed)
Hematology and Oncology Follow Up Visit  James [name] [id number] 23-Jul-1946 71 y.o. 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 Active myeloma diagnosed in June 2017 with an M spike of 3 g/dL and an IgA level of 3700. He was found to have mild anemia and bone marrow involvement. Bone marrow biopsy in April 2016 showed plasma cell myeloma with percentage ranging between 14-30%. Skeletal survey in June 2017 showed no bone lesions  Prior therapy: Revlimid and dexamethasone started on January 28 2016. Revlimid at 25 mg daily for 21 days with dexamethasone at 20 mg weekly. Therapy discontinued in January 2018. He achieved a near complete response to therapy.  Current therapy: Observation and surveillance.   Interim History:  [name] returns today for a follow up visit. Since the last visit, he reports he sustained a fall from his lawnmower and landed on his left side. He is unclear if he sustained any injury but he is reporting more pain on that side. He is scheduled to have a repeat MRI of his spine to rule out any injury. Despite his fall and increased pain, he still ambulates with the help of a cane without any major difficulties. He denied any neurological deficits or peripheral neuropathy. His appetite and performance status not dramatically changed.   He does not report any headaches, blurry vision, syncope or seizures. He does not report any fevers or chills or sweats. Does not report any cough or hemoptysis or hematemesis. He does not report any chest pain palpitation or orthopnea. He reports no nausea or vomiting or abdominal pain. Does not report any frequency urgency or hesitancy. He does not report any lymphadenopathy or petechiae. The remaining review of systems unremarkable.    Medications: I have reviewed the patient's current medications.  Current Outpatient Prescriptions   Medication Sig Dispense Refill  . amLODipine (NORVASC) 10 MG tablet Take 10 mg by mouth every morning.     . famotidine (PEPCID) 20 MG tablet Take 1 tablet (20 mg total) by mouth 2 (two) times daily. 10 tablet 0  . fentaNYL (DURAGESIC - DOSED MCG/HR) 50 MCG/HR Place onto the skin.    . ferrous sulfate 325 (65 FE) MG tablet Take 325 mg by mouth 2 (two) times daily with a meal.     . finasteride (PROSCAR) 5 MG tablet Take 5 mg by mouth daily.    Marland Kitchen gabapentin (NEURONTIN) 400 MG capsule Take 400 mg by mouth 3 (three) times daily.     Marland Kitchen glimepiride (AMARYL) 2 MG tablet Take 2 mg by mouth daily.    Marland Kitchen HYDROcodone-acetaminophen (NORCO) 10-325 MG tablet Take 1 tablet by mouth.    . lenalidomide (REVLIMID) 25 MG capsule TAKE 1 CAPSULE BY MOUTH DAILY FOR 21 DAYS THEN OFF FOR 7 DAYS (Patient not taking: Reported on 09/21/2016) 21 capsule 0  . losartan (COZAAR) 50 MG tablet Take 50 mg by mouth every morning.     . metFORMIN (GLUCOPHAGE-XR) 500 MG 24 hr tablet Take 1,000 mg by mouth 2 (two) times daily.     Marland Kitchen omeprazole (PRILOSEC) 20 MG capsule Take 20 mg by mouth 4 (four) times daily.     . Tamsulosin HCl (FLOMAX) 0.4 MG CAPS Take 0.4 mg by mouth 2 (two) times daily.     . traMADol (ULTRAM) 50 MG tablet Take 50 mg by mouth 4 (four) times daily -  with meals and at bedtime.  For pain     No current facility-administered medications for this visit.     Allergies:  Allergies  Allergen Reactions  . Invokana [Canagliflozin] Anaphylaxis    Facial and lip swelling    Past Medical History, Surgical history, Social history, and Family History were reviewed and updated.   Physical Exam: Blood pressure 120/70, pulse (!) 104, temperature 98.4 F (36.9 C), resp. rate 18, height 6' (1.829 m), weight 200 lb 6.4 oz (90.9 kg), SpO2 100 %. ECOG: 1 General appearance: Alert, awake gentleman without distress. Head: Normocephalic, without obvious abnormality no oral thrush or ulcers. Neck: no adenopathy Lymph  nodes: Cervical, supraclavicular, and axillary nodes normal. Heart:regular rate and rhythm, S1, S2 normal, no murmur, click, rub or gallop Lung:chest clear, no wheezing, rales, normal symmetric air entry. Abdomin: soft, non-tender, without masses or organomegaly no shifting dullness or ascites. EXT:no erythema, induration, or nodules Neurological: No deficits noted.  Lab Results: Lab Results  Component Value Date   WBC 4.9 11/15/2016   HGB 11.8 (L) 11/15/2016   HCT 35.5 (L) 11/15/2016   MCV 89.6 11/15/2016   PLT 257 11/15/2016     Chemistry      Component Value Date/Time   NA 143 11/15/2016 1512   K 3.4 (L) 11/15/2016 1512   CL 103 09/01/2015 1503   CL 105 08/09/2012 0918   CO2 28 11/15/2016 1512   BUN 8.5 11/15/2016 1512   CREATININE 1.0 11/15/2016 1512      Component Value Date/Time   CALCIUM 9.9 11/15/2016 1512   ALKPHOS 96 11/15/2016 1512   AST 12 11/15/2016 1512   ALT 7 11/15/2016 1512   BILITOT 0.32 11/15/2016 1512      Results for [name], James (MRN [id number]) as of 11/22/2016 14:51  Ref. Range 06/30/2016 13:12 07/28/2016 11:34 09/13/2016 14:53 11/15/2016 15:12  B-Globulin SerPl Elph-Mcnc Latest Ref Range: 0.7 - 1.3 g/dL 1.2 1.2 1.4 (H) 1.6 (H)  IgG (Immunoglobin G), Serum Latest Ref Range: 700 - 1,600 mg/dL 487 (L) 631 (L) 729 697 (L)  IgM, Qn, Serum Latest Ref Range: 15 - 143 mg/dL 14 (L) 13 (L) 10 (L) 10 (L)  M Protein SerPl Elph-Mcnc Latest Ref Range: Not Observed g/dL 0.3 (H) 0.4 (H) 0.4 (H) 0.5 (H)      Impression and Plan:  71 year old with:  1. 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 He has active disease started in June 2017 without bone lesions detected on his skeletal survey.  He is S/P Revlimid at 25 mg daily with dexamethasone completed in January 2018 with a complete response.  His Protein studies obtained on 11/15/2016 were reviewed today and discussed with the patient. He continues to have excellent response  based on these protein studies without any need for initiation of salvage therapy.  The plan is to continue with observation and surveillance and reinitiate therapy at the signs of relapse. He will have repeat protein studies and skeletal survey with the next visit.  2.  Back pain and hip pain: Related to degenerative disc disease. He has sustained a fall recently and will have a repeat MRI in the near future.  3. Bone health: Zometa has been offered in the past and have declined.   4. Weight loss: He is back to normal at this time with a normal weight.  5. Anemia: Hemoglobin is close to normal range.  6.  Follow-up: In 8 weeks to repeat his protein studies.   Zola Button, MD 4/25/20183:43  PM

## 2016-11-28 ENCOUNTER — Ambulatory Visit
Admission: RE | Admit: 2016-11-28 | Discharge: 2016-11-28 | Disposition: A | Discharge status: Home / Self Care | Payer: Medicare HMO | Source: Ambulatory Visit | Attending: Physician Assistant | Admitting: Physician Assistant

## 2016-11-28 DIAGNOSIS — M545 Low back pain: Secondary | ICD-10-CM

## 2016-11-28 DIAGNOSIS — S3992XA Unspecified injury of lower back, initial encounter: Secondary | ICD-10-CM | POA: Diagnosis not present

## 2016-12-07 DIAGNOSIS — N183 Chronic kidney disease, stage 3 (moderate): Secondary | ICD-10-CM | POA: Diagnosis not present

## 2016-12-07 DIAGNOSIS — I481 Persistent atrial fibrillation: Secondary | ICD-10-CM | POA: Diagnosis not present

## 2016-12-07 DIAGNOSIS — I110 Hypertensive heart disease with heart failure: Secondary | ICD-10-CM | POA: Diagnosis not present

## 2016-12-07 DIAGNOSIS — Z7901 Long term (current) use of anticoagulants: Secondary | ICD-10-CM | POA: Diagnosis not present

## 2016-12-07 DIAGNOSIS — I5042 Chronic combined systolic (congestive) and diastolic (congestive) heart failure: Secondary | ICD-10-CM | POA: Diagnosis not present

## 2016-12-13 ENCOUNTER — Other Ambulatory Visit: Payer: Self-pay | Admitting: Family Medicine

## 2016-12-13 DIAGNOSIS — R609 Edema, unspecified: Secondary | ICD-10-CM

## 2016-12-13 DIAGNOSIS — E876 Hypokalemia: Secondary | ICD-10-CM | POA: Diagnosis not present

## 2016-12-14 ENCOUNTER — Other Ambulatory Visit: Payer: Self-pay

## 2016-12-14 ENCOUNTER — Ambulatory Visit (HOSPITAL_COMMUNITY): Payer: Medicare HMO | Attending: Cardiovascular Disease

## 2016-12-14 DIAGNOSIS — I361 Nonrheumatic tricuspid (valve) insufficiency: Secondary | ICD-10-CM | POA: Diagnosis not present

## 2016-12-14 DIAGNOSIS — I340 Nonrheumatic mitral (valve) insufficiency: Secondary | ICD-10-CM | POA: Insufficient documentation

## 2016-12-14 DIAGNOSIS — R609 Edema, unspecified: Secondary | ICD-10-CM

## 2016-12-14 DIAGNOSIS — I371 Nonrheumatic pulmonary valve insufficiency: Secondary | ICD-10-CM | POA: Insufficient documentation

## 2016-12-14 MED ORDER — PERFLUTREN LIPID MICROSPHERE
1.0000 mL | INTRAVENOUS | Status: AC | PRN
  Administered 2016-12-14: 2 mL via INTRAVENOUS

## 2016-12-15 DIAGNOSIS — M5442 Lumbago with sciatica, left side: Secondary | ICD-10-CM | POA: Diagnosis not present

## 2016-12-15 DIAGNOSIS — M5441 Lumbago with sciatica, right side: Secondary | ICD-10-CM | POA: Diagnosis not present

## 2016-12-28 ENCOUNTER — Ambulatory Visit (HOSPITAL_COMMUNITY)
Admission: RE | Admit: 2016-12-28 | Discharge: 2016-12-28 | Disposition: A | Discharge status: Home / Self Care | Payer: Medicare HMO | Source: Ambulatory Visit | Attending: Oncology | Admitting: Oncology

## 2016-12-28 DIAGNOSIS — S32010A Wedge compression fracture of first lumbar vertebra, initial encounter for closed fracture: Secondary | ICD-10-CM | POA: Diagnosis not present

## 2016-12-28 DIAGNOSIS — N5201 Erectile dysfunction due to arterial insufficiency: Secondary | ICD-10-CM | POA: Diagnosis not present

## 2016-12-28 DIAGNOSIS — C9001 Multiple myeloma in remission: Secondary | ICD-10-CM | POA: Diagnosis not present

## 2016-12-28 DIAGNOSIS — N401 Enlarged prostate with lower urinary tract symptoms: Secondary | ICD-10-CM | POA: Diagnosis not present

## 2016-12-28 DIAGNOSIS — R937 Abnormal findings on diagnostic imaging of other parts of musculoskeletal system: Secondary | ICD-10-CM | POA: Insufficient documentation

## 2016-12-28 DIAGNOSIS — R350 Frequency of micturition: Secondary | ICD-10-CM | POA: Diagnosis not present

## 2017-01-01 DIAGNOSIS — M5416 Radiculopathy, lumbar region: Secondary | ICD-10-CM | POA: Diagnosis not present

## 2017-01-01 DIAGNOSIS — G8929 Other chronic pain: Secondary | ICD-10-CM | POA: Diagnosis not present

## 2017-01-01 DIAGNOSIS — E1142 Type 2 diabetes mellitus with diabetic polyneuropathy: Secondary | ICD-10-CM | POA: Diagnosis not present

## 2017-01-01 DIAGNOSIS — M5441 Lumbago with sciatica, right side: Secondary | ICD-10-CM | POA: Diagnosis not present

## 2017-01-01 DIAGNOSIS — M5442 Lumbago with sciatica, left side: Secondary | ICD-10-CM | POA: Diagnosis not present

## 2017-01-01 DIAGNOSIS — Z79899 Other long term (current) drug therapy: Secondary | ICD-10-CM | POA: Diagnosis not present

## 2017-01-02 DIAGNOSIS — H25813 Combined forms of age-related cataract, bilateral: Secondary | ICD-10-CM | POA: Diagnosis not present

## 2017-01-02 DIAGNOSIS — H524 Presbyopia: Secondary | ICD-10-CM | POA: Diagnosis not present

## 2017-01-08 DIAGNOSIS — S32011A Stable burst fracture of first lumbar vertebra, initial encounter for closed fracture: Secondary | ICD-10-CM | POA: Diagnosis not present

## 2017-01-18 ENCOUNTER — Other Ambulatory Visit (HOSPITAL_BASED_OUTPATIENT_CLINIC_OR_DEPARTMENT_OTHER): Payer: Medicare HMO

## 2017-01-18 DIAGNOSIS — M109 Gout, unspecified: Secondary | ICD-10-CM | POA: Diagnosis not present

## 2017-01-18 DIAGNOSIS — C9001 Multiple myeloma in remission: Secondary | ICD-10-CM | POA: Diagnosis not present

## 2017-01-18 DIAGNOSIS — D649 Anemia, unspecified: Secondary | ICD-10-CM | POA: Diagnosis not present

## 2017-01-18 DIAGNOSIS — C9000 Multiple myeloma not having achieved remission: Secondary | ICD-10-CM | POA: Diagnosis not present

## 2017-01-18 LAB — CBC WITH DIFFERENTIAL/PLATELET
BASO%: 0.2 % (ref 0.0–2.0)
Basophils Absolute: 0 10*3/uL (ref 0.0–0.1)
EOS%: 1.3 % (ref 0.0–7.0)
Eosinophils Absolute: 0.1 10*3/uL (ref 0.0–0.5)
HCT: 37.9 % — ABNORMAL LOW (ref 38.4–49.9)
HGB: 12.5 g/dL — ABNORMAL LOW (ref 13.0–17.1)
LYMPH%: 39.8 % (ref 14.0–49.0)
MCH: 29.4 pg (ref 27.2–33.4)
MCHC: 33 g/dL (ref 32.0–36.0)
MCV: 89.2 fL (ref 79.3–98.0)
MONO#: 0.4 10*3/uL (ref 0.1–0.9)
MONO%: 7.8 % (ref 0.0–14.0)
NEUT#: 2.7 10*3/uL (ref 1.5–6.5)
NEUT%: 50.9 % (ref 39.0–75.0)
Platelets: 192 10*3/uL (ref 140–400)
RBC: 4.25 10*6/uL (ref 4.20–5.82)
RDW: 14.3 % (ref 11.0–14.6)
WBC: 5.2 10*3/uL (ref 4.0–10.3)
lymph#: 2.1 10*3/uL (ref 0.9–3.3)

## 2017-01-18 LAB — COMPREHENSIVE METABOLIC PANEL
ALT: 15 U/L (ref 0–55)
AST: 14 U/L (ref 5–34)
Albumin: 3.6 g/dL (ref 3.5–5.0)
Alkaline Phosphatase: 101 U/L (ref 40–150)
Anion Gap: 9 mEq/L (ref 3–11)
BUN: 11.8 mg/dL (ref 7.0–26.0)
CO2: 25 mEq/L (ref 22–29)
Calcium: 9.5 mg/dL (ref 8.4–10.4)
Chloride: 104 mEq/L (ref 98–109)
Creatinine: 1.2 mg/dL (ref 0.7–1.3)
EGFR: 73 mL/min/{1.73_m2} — ABNORMAL LOW (ref >90)
Glucose: 200 mg/dl — ABNORMAL HIGH (ref 70–140)
Potassium: 3.9 mEq/L (ref 3.5–5.1)
Sodium: 138 mEq/L (ref 136–145)
Total Bilirubin: 0.28 mg/dL (ref 0.20–1.20)
Total Protein: 8 g/dL (ref 6.4–8.3)

## 2017-01-19 LAB — KAPPA/LAMBDA LIGHT CHAINS
Ig Kappa Free Light Chain: 18 mg/L (ref 3.3–19.4)
Ig Lambda Free Light Chain: 13.7 mg/L (ref 5.7–26.3)
Kappa/Lambda FluidC Ratio: 1.31 (ref 0.26–1.65)

## 2017-01-22 LAB — MULTIPLE MYELOMA PANEL, SERUM
Albumin SerPl Elph-Mcnc: 3.7 g/dL (ref 2.9–4.4)
Albumin/Glob SerPl: 1.1 (ref 0.7–1.7)
Alpha 1: 0.2 g/dL (ref 0.0–0.4)
Alpha2 Glob SerPl Elph-Mcnc: 0.8 g/dL (ref 0.4–1.0)
B-Globulin SerPl Elph-Mcnc: 2 g/dL — ABNORMAL HIGH (ref 0.7–1.3)
Gamma Glob SerPl Elph-Mcnc: 0.7 g/dL (ref 0.4–1.8)
Globulin, Total: 3.6 g/dL (ref 2.2–3.9)
IgA, Qn, Serum: 1499 mg/dL — ABNORMAL HIGH (ref 61–437)
IgG, Qn, Serum: 614 mg/dL — ABNORMAL LOW (ref 700–1600)
IgM, Qn, Serum: 12 mg/dL — ABNORMAL LOW (ref 15–143)
M Protein SerPl Elph-Mcnc: 1.3 g/dL — ABNORMAL HIGH
Total Protein: 7.3 g/dL (ref 6.0–8.5)

## 2017-01-25 ENCOUNTER — Ambulatory Visit (HOSPITAL_BASED_OUTPATIENT_CLINIC_OR_DEPARTMENT_OTHER): Payer: Medicare HMO | Admitting: Oncology

## 2017-01-25 ENCOUNTER — Telehealth: Payer: Self-pay | Admitting: Oncology

## 2017-01-25 VITALS — BP 154/66 | HR 95 | Temp 97.7°F | Resp 18 | Ht 72.0 in | Wt 203.0 lb

## 2017-01-25 DIAGNOSIS — D649 Anemia, unspecified: Secondary | ICD-10-CM

## 2017-01-25 DIAGNOSIS — C9001 Multiple myeloma in remission: Secondary | ICD-10-CM

## 2017-01-25 NOTE — Telephone Encounter (Signed)
Scheduled appt per 6/28 los - Gave patient AVS and calender per los.  

## 2017-01-25 NOTE — Progress Notes (Signed)
Hematology and Oncology Follow Up Visit  James [name] [id number] 1945-10-08 71 y.o. 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 Active myeloma diagnosed in June 2017 with an M spike of 3 g/dL and an IgA level of 3700. He was found to have mild anemia and bone marrow involvement. Bone marrow biopsy in April 2016 showed plasma cell myeloma with percentage ranging between 14-30%. Skeletal survey in June 2017 showed no bone lesions  Prior therapy: Revlimid and dexamethasone started on January 28 2016. Revlimid at 25 mg daily for 21 days with dexamethasone at 20 mg weekly. Therapy discontinued in January 2018. He achieved a near complete response to therapy.  Current therapy: Observation and surveillance.   Interim History:  James [name] returns today for a follow up visit. Since the last visit, he reports doing reasonably well without any changes in his health. His mobility has improved since he sustained a fall from his lawnmower and landed on his left side. His pain continues to be an issue although not dramatically changed. He is able to drive and attends to activities of daily living without using a walker or cane. He denied recent pathological fractures or infections. He denied any peripheral neuropathy. His back pain has been chronic and unchanged in nature.  He does not report any headaches, blurry vision, syncope or seizures. He does not report any fevers or chills or sweats. Does not report any cough or hemoptysis or hematemesis. He does not report any chest pain palpitation or orthopnea. He reports no nausea or vomiting or abdominal pain. Does not report any frequency urgency or hesitancy. He does not report any lymphadenopathy or petechiae. The remaining review of systems unremarkable.    Medications: I have reviewed the patient's current medications.  Current Outpatient Prescriptions   Medication Sig Dispense Refill  . amLODipine (NORVASC) 10 MG tablet Take 10 mg by mouth every morning.     . famotidine (PEPCID) 20 MG tablet Take 1 tablet (20 mg total) by mouth 2 (two) times daily. 10 tablet 0  . fentaNYL (DURAGESIC - DOSED MCG/HR) 50 MCG/HR Place onto the skin.    . ferrous sulfate 325 (65 FE) MG tablet Take 325 mg by mouth 2 (two) times daily with a meal.     . finasteride (PROSCAR) 5 MG tablet Take 5 mg by mouth daily.    [name] Kitchen gabapentin (NEURONTIN) 400 MG capsule Take 400 mg by mouth 3 (three) times daily.     [name] Kitchen glimepiride (AMARYL) 2 MG tablet Take 2 mg by mouth daily.    [name] Kitchen HYDROcodone-acetaminophen (NORCO) 10-325 MG tablet Take 1 tablet by mouth.    . lenalidomide (REVLIMID) 25 MG capsule TAKE 1 CAPSULE BY MOUTH DAILY FOR 21 DAYS THEN OFF FOR 7 DAYS (Patient not taking: Reported on 09/21/2016) 21 capsule 0  . losartan (COZAAR) 50 MG tablet Take 50 mg by mouth every morning.     . metFORMIN (GLUCOPHAGE-XR) 500 MG 24 hr tablet Take 1,000 mg by mouth 2 (two) times daily.     [name] Kitchen omeprazole (PRILOSEC) 20 MG capsule Take 20 mg by mouth 4 (four) times daily.     . Tamsulosin HCl (FLOMAX) 0.4 MG CAPS Take 0.4 mg by mouth 2 (two) times daily.     . traMADol (ULTRAM) 50 MG tablet Take 50 mg by mouth 4 (four) times daily -  with meals and at bedtime. For pain  No current facility-administered medications for this visit.     Allergies:  Allergies  Allergen Reactions  . Invokana [Canagliflozin] Anaphylaxis    Facial and lip swelling    Past Medical History, Surgical history, Social history, and Family History were reviewed and updated.   Physical Exam: Blood pressure (!) 154/66, pulse 95, temperature 97.7 F (36.5 C), temperature source Oral, resp. rate 18, height 6' (1.829 m), weight 203 lb (92.1 kg), SpO2 100 %. ECOG: 1 General appearance: Well-appearing gentleman without distress. Head: Normocephalic, without obvious abnormality no oral ulcers or lesions. Neck: no  adenopathy Lymph nodes: Cervical, supraclavicular, and axillary nodes normal. Heart:regular rate and rhythm, S1, S2 normal, no murmur, click, rub or gallop Lung:chest clear, no wheezing, rales, normal symmetric air entry. Abdomin: soft, non-tender, without masses or organomegaly no rebound or guarding. EXT:no erythema, induration, or nodules Neurological: No deficits noted.  Lab Results: Lab Results  Component Value Date   WBC 5.2 01/18/2017   HGB 12.5 (L) 01/18/2017   HCT 37.9 (L) 01/18/2017   MCV 89.2 01/18/2017   PLT 192 01/18/2017     Chemistry      Component Value Date/Time   NA 138 01/18/2017 1118   K 3.9 01/18/2017 1118   CL 103 09/01/2015 1503   CL 105 08/09/2012 0918   CO2 25 01/18/2017 1118   BUN 11.8 01/18/2017 1118   CREATININE 1.2 01/18/2017 1118      Component Value Date/Time   CALCIUM 9.5 01/18/2017 1118   ALKPHOS 101 01/18/2017 1118   AST 14 01/18/2017 1118   ALT 15 01/18/2017 1118   BILITOT 0.28 01/18/2017 1118      Results for James, [name] (MRN [id number]) as of 01/25/2017 14:45  Ref. Range 11/15/2016 15:12 01/18/2017 11:18 01/18/2017 11:18  M Protein SerPl Elph-Mcnc Latest Ref Range: Not Observed g/dL 0.5 (H)  1.3 (H)      Impression and Plan:  71 year old with:  1. 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 He has active disease started in June 2017 without bone lesions detected on his skeletal survey.  He is S/P Revlimid at 25 mg daily with dexamethasone completed in January 2018 with a complete response.  His Protein studies obtained On 01/18/2017 were reviewed and discussed with the patient. His M spike is rising but no evidence to suggest symptomatic disease.  The plan is to continue with observation and surveillance and reinitiate therapy at the signs of relapse. I suspect he will require restarting treatment in the future without L be in the form of a maintenance Revlimid or induction with Velcade. We will like  to defer at this point the start of his treatment until he is symptomatic.  2.  Back pain and hip pain: Related to degenerative disc disease. MRI on 11/28/2016 showed no evidence of myeloma lesions.  3. Bone health: Zometa has been offered in the past and have declined.   4. Weight loss: His appetite is excellent and have regained his weight back.  5. Anemia: Hemoglobin is close to normal range.  6.  Follow-up: In September 2018.   Zola Button, MD 6/28/20183:57 PM

## 2017-02-16 DIAGNOSIS — I5042 Chronic combined systolic (congestive) and diastolic (congestive) heart failure: Secondary | ICD-10-CM

## 2017-02-16 DIAGNOSIS — I712 Thoracic aortic aneurysm, without rupture, unspecified: Secondary | ICD-10-CM

## 2017-02-16 DIAGNOSIS — I4819 Other persistent atrial fibrillation: Secondary | ICD-10-CM | POA: Insufficient documentation

## 2017-02-16 DIAGNOSIS — I4821 Permanent atrial fibrillation: Secondary | ICD-10-CM | POA: Insufficient documentation

## 2017-02-16 DIAGNOSIS — I130 Hypertensive heart and chronic kidney disease with heart failure and stage 1 through stage 4 chronic kidney disease, or unspecified chronic kidney disease: Secondary | ICD-10-CM | POA: Insufficient documentation

## 2017-02-16 DIAGNOSIS — I10 Essential (primary) hypertension: Secondary | ICD-10-CM | POA: Insufficient documentation

## 2017-02-16 HISTORY — DX: Other persistent atrial fibrillation: I48.19

## 2017-02-16 HISTORY — DX: Thoracic aortic aneurysm, without rupture: I71.2

## 2017-02-16 HISTORY — DX: Chronic combined systolic (congestive) and diastolic (congestive) heart failure: I50.42

## 2017-02-16 HISTORY — DX: Thoracic aortic aneurysm, without rupture, unspecified: I71.20

## 2017-02-16 HISTORY — DX: Essential (primary) hypertension: I10

## 2017-02-26 DIAGNOSIS — S32011A Stable burst fracture of first lumbar vertebra, initial encounter for closed fracture: Secondary | ICD-10-CM | POA: Diagnosis not present

## 2017-02-26 DIAGNOSIS — Z6830 Body mass index (BMI) 30.0-30.9, adult: Secondary | ICD-10-CM | POA: Diagnosis not present

## 2017-02-26 DIAGNOSIS — I10 Essential (primary) hypertension: Secondary | ICD-10-CM | POA: Diagnosis not present

## 2017-03-04 NOTE — Progress Notes (Signed)
Cardiology Office Note:    Date:  03/05/2017   ID:  Kevin Lang, DOB [birth year], MRN [id number]  PCP:  Hendricks Limes, PA-C  Cardiologist:  Norman Herrlich, MD    Referring MD: No ref. provider found    ASSESSMENT:    1. Chronic combined systolic and diastolic congestive heart failure (HCC)   2. Hypertensive heart failure (HCC)   3. Persistent atrial fibrillation (HCC)   4. Chronic anticoagulation    PLAN:    In order of problems listed above:  1. Improved less symptomatic no edema and I asked him to discontinue furosemide continued torsemide. I offered him if he would weigh daily to give him weight guided diuretic dosage he declines.  2. Stable blood pressure continue current treatment 3. Stable rate controlled continue his anticoagulant and calcium and beta blocker 4. Stable continue his current anticoagulant   Next appointment: 6 months   Medication Adjustments/Labs and Tests Ordered: Current medicines are reviewed at length with the patient today.  Concerns regarding medicines are outlined above.  Orders Placed This Encounter  Procedures  . Basic Metabolic Panel (BMET)  . B Nat Peptide   No orders of the defined types were placed in this encounter.   Chief Complaint  Patient presents with  . Follow-up    pt states was px Torsemide back in june and has not followed up since then.   . Congestive Heart Failure  . Atrial Fibrillation    History of Present Illness:    Kevin Lang is a 71 y.o. 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 He is now using CPAP for sleep apnea.He has unhappy with the quality of his life and would not share any other details with me. His edema is improved has  mild exertional shortness of breath without orthopnea or PND and has had no chest pain palpitation or syncope.Marland Kitchen He does not weigh at home and refuses to start home self management Compliance with diet, lifestyle and medications: No he foes not weigh daily at home Past Medical History:  Diagnosis Date  . Aneurysm of thoracic aorta (HCC) 02/16/2017   Overview:  Cardiac cath 12/08/15: Conclusions Diagnostic Procedure Summary 1. Mild non-obstructive CAD 2. Normal LV systolic function 3. Aortogram performed to eval aortic root aneurysm, 4.0 cm by this study.  CT Jan 2017, ascending aaorta aneurysm, 4.8 cm IMOUPDATE  . Benign essential hypertension 02/16/2017  . Cardiomyopathy (HCC) 08/23/2015  . Chronic anticoagulation 05/21/2016  . Chronic combined systolic and diastolic congestive heart failure (HCC) 02/16/2017  . CKD (chronic kidney disease) stage 3, GFR 30-59 ml/min 12/31/2015  . COPD (chronic obstructive pulmonary disease) (HCC) 05/21/2016  . Mild CAD 05/21/2016   Overview:  Cardiac cath 12/08/15:Conclusions Diagnostic Procedure Summary 1. Mild non-obstructive CAD 2. Normal LV systolic function 3. Aortogram performed to eval aortic root aneurysm, 4.0 cm by this study. Diagnostic Procedure Recommendations  . Persistent atrial fibrillation (HCC) 02/16/2017    Past Surgical History:  Procedure Laterality Date  . CORONARY ANGIOPLASTY      Current Medications: Current Meds  Medication Sig  . apixaban (ELIQUIS) 5 MG TABS tablet Take 5 mg by mouth 2 (two) times daily.  Marland Kitchen atorvastatin (LIPITOR) 40 MG tablet Take 40  mg by mouth daily.  Marland Kitchen diltiazem (CARDIZEM) 120 MG tablet Take 120 mg by mouth daily.  Marland Kitchen lisinopril (PRINIVIL,ZESTRIL) 10 MG tablet Take 10 mg by mouth 2 (two) times daily.  . metoprolol tartrate (LOPRESSOR) 50 MG tablet Take 50 mg by mouth 2 (two) times daily.  . nitroGLYCERIN (NITROSTAT) 0.4 MG SL tablet Place 0.4 mg under the tongue every 5 (five) hours as needed.  Marland Kitchen spironolactone  (ALDACTONE) 25 MG tablet Take 12.5 mg by mouth daily.  Marland Kitchen torsemide (DEMADEX) 20 MG tablet Take 20 mg by mouth 2 (two) times daily.  . [DISCONTINUED] furosemide (LASIX) 40 MG tablet Take 40 mg by mouth daily.     Allergies:   Patient has no known allergies.   Social History   Social History  . Marital status: Married    Spouse name: N/A  . Number of children: N/A  . Years of education: N/A   Social History Main Topics  . Smoking status: Former Smoker    Packs/day: 0.50    Years: 30.00    Types: Cigarettes    Quit date: 08/22/2000  . Smokeless tobacco: Never Used  . Alcohol use No  . Drug use: No  . Sexual activity: Not Asked   Other Topics Concern  . None   Social History Narrative  . None     Family History: The patient's family history includes Aneurysm in his father; Heart disease in his brother; Hypertension in his mother; Stroke in his mother. ROS:   Please see the history of present illness.    All other systems reviewed and are negative.  EKGs/Labs/Other Studies Reviewed:    The following studies were reviewed today:  Recent Labs: CBC and hepatic profile 02/22/17 normal No results found for requested labs within last 8760 hours.  Recent Lipid Panel  LDL 55 02/22/17 No results found for: CHOL, TRIG, HDL, CHOLHDL, VLDL, LDLCALC, LDLDIRECT  Physical Exam:    VS:  BP 124/60   Pulse 78   Ht 5\' 4"  (1.626 m)   Wt 229 lb (103.9 kg)   SpO2 96%   BMI 39.31 kg/m     Wt Readings from Last 3 Encounters:  03/05/17 229 lb (103.9 kg)     GEN:  Well nourished, well developed in no acute distress HEENT: Normal NECK: No JVD; No carotid bruits LYMPHATICS: No lymphadenopathy CARDIAC: Irr Irr  Variable S1no murmurs, rubs, gallops RESPIRATORY:  Clear to auscultation without rales, wheezing or rhonchi  ABDOMEN: Soft, non-tender, non-distended MUSCULOSKELETAL:  No edema; No deformity  SKIN: Warm and dry NEUROLOGIC:  Alert and oriented x 3 PSYCHIATRIC:  Normal  affect    Signed, Norman Herrlich, MD  03/05/2017 8:33 AM    [facility]

## 2017-03-05 ENCOUNTER — Ambulatory Visit (INDEPENDENT_AMBULATORY_CARE_PROVIDER_SITE_OTHER): Payer: Self-pay | Admitting: Cardiology

## 2017-03-05 ENCOUNTER — Encounter: Payer: Self-pay | Admitting: Cardiology

## 2017-03-05 VITALS — BP 124/60 | HR 78 | Ht 64.0 in | Wt 229.0 lb

## 2017-03-05 DIAGNOSIS — Z7901 Long term (current) use of anticoagulants: Secondary | ICD-10-CM

## 2017-03-05 DIAGNOSIS — I481 Persistent atrial fibrillation: Secondary | ICD-10-CM

## 2017-03-05 DIAGNOSIS — I5042 Chronic combined systolic (congestive) and diastolic (congestive) heart failure: Secondary | ICD-10-CM | POA: Diagnosis not present

## 2017-03-05 DIAGNOSIS — I110 Hypertensive heart disease with heart failure: Secondary | ICD-10-CM

## 2017-03-05 DIAGNOSIS — I4819 Other persistent atrial fibrillation: Secondary | ICD-10-CM

## 2017-03-05 NOTE — Patient Instructions (Addendum)
Medication Instructions:  Your physician has recommended you make the following change in your medication:  STOP furosemide (Lasix)   Labwork: Your physician recommends that you return for lab work in: today. BMP, BNP   Testing/Procedures: None  Follow-Up: Your physician recommends that you schedule a follow-up appointment in: 3 months.   Any Other Special Instructions Will Be Listed Below (If Applicable).     If you need a refill on your cardiac medications before your next appointment, please call your pharmacy.    KNOW YOUR HEART FAILURE ZONES  Green Zone (Your Goal):  Marland Kitchen No shortness of breath or trouble bleeding . No weight gain of more than 3 pounds in one day or 5 pounds in a week . No swelling in your ankles, feet, stomach, or hands . No chest discomfort, heaviness or pain  Yellow Zone (Call the Doctor to get help!): Having 1 or more of the following: . Weight gain of 3 pounds in 1 day or 5 pounds in 1 week . More swelling of your feet, ankles, stomach, or hands . It is harder to breath when lying down. You need to sit up . Chest discomfort, heaviness, or pain . You feel more tired or have less energy than normal . New or worsening dizziness . Dry hacking cough . You feel uneasy and you know something is not right  Red Zone (Call 911-EMERGENCY): . Struggling to breathe. This does not go away when you sit up. . Stronger and more regular amounts of chest discomfort . New confusion or can't think clearly . Fainting or near fainting   Resources form the [employer] Association: [id number].jsp

## 2017-03-06 LAB — BASIC METABOLIC PANEL
BUN/Creatinine Ratio: 21 (ref 10–24)
BUN: 41 mg/dL — AB (ref 8–27)
CALCIUM: 9.5 mg/dL (ref 8.6–10.2)
CHLORIDE: 106 mmol/L (ref 96–106)
CO2: 26 mmol/L (ref 20–29)
Creatinine, Ser: 1.95 mg/dL — ABNORMAL HIGH (ref 0.76–1.27)
GFR calc Af Amer: 38 mL/min/{1.73_m2} — ABNORMAL LOW (ref >59)
GFR, EST NON AFRICAN AMERICAN: 33 mL/min/{1.73_m2} — AB (ref >59)
Glucose: 105 mg/dL — ABNORMAL HIGH (ref 65–99)
POTASSIUM: 4.5 mmol/L (ref 3.5–5.2)
Sodium: 147 mmol/L — ABNORMAL HIGH (ref 134–144)

## 2017-03-06 LAB — BRAIN NATRIURETIC PEPTIDE: BNP: 112 pg/mL — ABNORMAL HIGH (ref 0.0–100.0)

## 2017-03-07 DIAGNOSIS — M5441 Lumbago with sciatica, right side: Secondary | ICD-10-CM | POA: Diagnosis not present

## 2017-03-07 DIAGNOSIS — M5442 Lumbago with sciatica, left side: Secondary | ICD-10-CM | POA: Diagnosis not present

## 2017-03-07 DIAGNOSIS — M5416 Radiculopathy, lumbar region: Secondary | ICD-10-CM | POA: Diagnosis not present

## 2017-03-07 DIAGNOSIS — M5136 Other intervertebral disc degeneration, lumbar region: Secondary | ICD-10-CM | POA: Diagnosis not present

## 2017-03-07 DIAGNOSIS — E1142 Type 2 diabetes mellitus with diabetic polyneuropathy: Secondary | ICD-10-CM | POA: Diagnosis not present

## 2017-03-07 DIAGNOSIS — Z79899 Other long term (current) drug therapy: Secondary | ICD-10-CM | POA: Diagnosis not present

## 2017-03-20 DIAGNOSIS — C9001 Multiple myeloma in remission: Secondary | ICD-10-CM | POA: Diagnosis not present

## 2017-03-20 DIAGNOSIS — E1142 Type 2 diabetes mellitus with diabetic polyneuropathy: Secondary | ICD-10-CM | POA: Diagnosis not present

## 2017-03-20 DIAGNOSIS — M5136 Other intervertebral disc degeneration, lumbar region: Secondary | ICD-10-CM | POA: Diagnosis not present

## 2017-03-20 DIAGNOSIS — M5416 Radiculopathy, lumbar region: Secondary | ICD-10-CM | POA: Diagnosis not present

## 2017-03-23 DIAGNOSIS — M5416 Radiculopathy, lumbar region: Secondary | ICD-10-CM | POA: Diagnosis not present

## 2017-03-28 DIAGNOSIS — M545 Low back pain: Secondary | ICD-10-CM | POA: Diagnosis not present

## 2017-03-28 DIAGNOSIS — S32011A Stable burst fracture of first lumbar vertebra, initial encounter for closed fracture: Secondary | ICD-10-CM | POA: Diagnosis not present

## 2017-03-28 DIAGNOSIS — G8929 Other chronic pain: Secondary | ICD-10-CM | POA: Diagnosis not present

## 2017-03-28 DIAGNOSIS — Z6830 Body mass index (BMI) 30.0-30.9, adult: Secondary | ICD-10-CM | POA: Diagnosis not present

## 2017-04-04 ENCOUNTER — Other Ambulatory Visit (HOSPITAL_BASED_OUTPATIENT_CLINIC_OR_DEPARTMENT_OTHER): Payer: Medicare HMO

## 2017-04-04 DIAGNOSIS — C9001 Multiple myeloma in remission: Secondary | ICD-10-CM

## 2017-04-04 LAB — CBC WITH DIFFERENTIAL/PLATELET
BASO%: 0.2 % (ref 0.0–2.0)
Basophils Absolute: 0 10*3/uL (ref 0.0–0.1)
EOS%: 0.4 % (ref 0.0–7.0)
Eosinophils Absolute: 0.1 10*3/uL (ref 0.0–0.5)
HCT: 40.5 % (ref 38.4–49.9)
HGB: 13.7 g/dL (ref 13.0–17.1)
LYMPH%: 27.9 % (ref 14.0–49.0)
MCH: 30.2 pg (ref 27.2–33.4)
MCHC: 33.8 g/dL (ref 32.0–36.0)
MCV: 89.2 fL (ref 79.3–98.0)
MONO#: 0.8 10*3/uL (ref 0.1–0.9)
MONO%: 7 % (ref 0.0–14.0)
NEUT#: 7.2 10*3/uL — ABNORMAL HIGH (ref 1.5–6.5)
NEUT%: 64.5 % (ref 39.0–75.0)
Platelets: 281 10*3/uL (ref 140–400)
RBC: 4.54 10*6/uL (ref 4.20–5.82)
RDW: 14.8 % — ABNORMAL HIGH (ref 11.0–14.6)
WBC: 11.1 10*3/uL — ABNORMAL HIGH (ref 4.0–10.3)
lymph#: 3.1 10*3/uL (ref 0.9–3.3)

## 2017-04-04 LAB — COMPREHENSIVE METABOLIC PANEL
ALT: 11 U/L (ref 0–55)
AST: 12 U/L (ref 5–34)
Albumin: 3.9 g/dL (ref 3.5–5.0)
Alkaline Phosphatase: 104 U/L (ref 40–150)
Anion Gap: 10 mEq/L (ref 3–11)
BUN: 17 mg/dL (ref 7.0–26.0)
CO2: 23 mEq/L (ref 22–29)
Calcium: 9.8 mg/dL (ref 8.4–10.4)
Chloride: 105 mEq/L (ref 98–109)
Creatinine: 1.3 mg/dL (ref 0.7–1.3)
EGFR: 62 mL/min/{1.73_m2} — ABNORMAL LOW (ref >90)
Glucose: 168 mg/dl — ABNORMAL HIGH (ref 70–140)
Potassium: 3.8 mEq/L (ref 3.5–5.1)
Sodium: 138 mEq/L (ref 136–145)
Total Bilirubin: 0.4 mg/dL (ref 0.20–1.20)
Total Protein: 8.6 g/dL — ABNORMAL HIGH (ref 6.4–8.3)

## 2017-04-05 LAB — KAPPA/LAMBDA LIGHT CHAINS
Ig Kappa Free Light Chain: 12.6 mg/L (ref 3.3–19.4)
Ig Lambda Free Light Chain: 11.2 mg/L (ref 5.7–26.3)
Kappa/Lambda FluidC Ratio: 1.13 (ref 0.26–1.65)

## 2017-04-09 LAB — MULTIPLE MYELOMA PANEL, SERUM
Albumin SerPl Elph-Mcnc: 4.1 g/dL (ref 2.9–4.4)
Albumin/Glob SerPl: 1.1 (ref 0.7–1.7)
Alpha 1: 0.2 g/dL (ref 0.0–0.4)
Alpha2 Glob SerPl Elph-Mcnc: 0.8 g/dL (ref 0.4–1.0)
B-Globulin SerPl Elph-Mcnc: 2 g/dL — ABNORMAL HIGH (ref 0.7–1.3)
Gamma Glob SerPl Elph-Mcnc: 0.8 g/dL (ref 0.4–1.8)
Globulin, Total: 3.8 g/dL (ref 2.2–3.9)
IgA, Qn, Serum: 1719 mg/dL — ABNORMAL HIGH (ref 61–437)
IgG, Qn, Serum: 806 mg/dL (ref 700–1600)
IgM, Qn, Serum: 14 mg/dL — ABNORMAL LOW (ref 15–143)
M Protein SerPl Elph-Mcnc: 1.4 g/dL — ABNORMAL HIGH
Total Protein: 7.9 g/dL (ref 6.0–8.5)

## 2017-04-11 ENCOUNTER — Ambulatory Visit (HOSPITAL_BASED_OUTPATIENT_CLINIC_OR_DEPARTMENT_OTHER): Payer: Medicare HMO | Admitting: Oncology

## 2017-04-11 ENCOUNTER — Telehealth: Payer: Self-pay

## 2017-04-11 VITALS — BP 139/66 | HR 91 | Temp 98.2°F | Resp 16 | Ht 72.0 in | Wt 201.9 lb

## 2017-04-11 DIAGNOSIS — C9000 Multiple myeloma not having achieved remission: Secondary | ICD-10-CM | POA: Diagnosis not present

## 2017-04-11 DIAGNOSIS — C9001 Multiple myeloma in remission: Secondary | ICD-10-CM

## 2017-04-11 NOTE — Progress Notes (Signed)
Hematology and Oncology Follow Up Visit  James [name] [id number] Apr 27, 1946 71 y.o. 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 Active myeloma diagnosed in June 2017 with an M spike of 3 g/dL and an IgA level of 3700. He was found to have mild anemia and bone marrow involvement. Bone marrow biopsy in April 2016 showed plasma cell myeloma with percentage ranging between 14-30%. Skeletal survey in June 2017 showed no bone lesions  Prior therapy: Revlimid and dexamethasone started on January 28 2016. Revlimid at 25 mg daily for 21 days with dexamethasone at 20 mg weekly. Therapy discontinued in January 2018. He achieved a near complete response to therapy.  Current therapy: Observation and surveillance.   Interim History:  James [name] returns today for a follow up visit. Since the last visit, he reports no changes or complaints. He continues to ambulate without any help of a walker or cane. He denied any recent falls or syncope. His pain continues to be an issue although not dramatically changed. He denied recent pathological fractures or infections. He denied any peripheral neuropathy. His back pain has been chronic and unchanged in nature. His appetite and performance status not changed.  He does not report any headaches, blurry vision, syncope or seizures. He does not report any fevers or chills or sweats. Does not report any cough or hemoptysis or hematemesis. He does not report any chest pain palpitation or orthopnea. He reports no nausea or vomiting or abdominal pain. Does not report any frequency urgency or hesitancy. He does not report any lymphadenopathy or petechiae. The remaining review of systems unremarkable.    Medications: I have reviewed the patient's current medications.  Current Outpatient Prescriptions  Medication Sig Dispense Refill  . amLODipine (NORVASC) 10 MG tablet Take  10 mg by mouth every morning.     . famotidine (PEPCID) 20 MG tablet Take 1 tablet (20 mg total) by mouth 2 (two) times daily. 10 tablet 0  . fentaNYL (DURAGESIC - DOSED MCG/HR) 50 MCG/HR Place onto the skin.    . ferrous sulfate 325 (65 FE) MG tablet Take 325 mg by mouth 2 (two) times daily with a meal.     . finasteride (PROSCAR) 5 MG tablet Take 5 mg by mouth daily.    Marland Kitchen gabapentin (NEURONTIN) 400 MG capsule Take 400 mg by mouth 3 (three) times daily.     Marland Kitchen glimepiride (AMARYL) 2 MG tablet Take 2 mg by mouth daily.    Marland Kitchen HYDROcodone-acetaminophen (NORCO) 10-325 MG tablet Take 1 tablet by mouth.    . lenalidomide (REVLIMID) 25 MG capsule TAKE 1 CAPSULE BY MOUTH DAILY FOR 21 DAYS THEN OFF FOR 7 DAYS (Patient not taking: Reported on 09/21/2016) 21 capsule 0  . losartan (COZAAR) 50 MG tablet Take 50 mg by mouth every morning.     . metFORMIN (GLUCOPHAGE-XR) 500 MG 24 hr tablet Take 1,000 mg by mouth 2 (two) times daily.     Marland Kitchen omeprazole (PRILOSEC) 20 MG capsule Take 20 mg by mouth 4 (four) times daily.     . Tamsulosin HCl (FLOMAX) 0.4 MG CAPS Take 0.4 mg by mouth 2 (two) times daily.     . traMADol (ULTRAM) 50 MG tablet Take 50 mg by mouth 4 (four) times daily -  with meals and at bedtime. For pain     No current facility-administered medications for this visit.     Allergies:  Allergies  Allergen Reactions  . Invokana [Canagliflozin] Anaphylaxis    Facial and lip swelling    Past Medical History, Surgical history, Social history, and Family History were reviewed and updated.   Physical Exam: Blood pressure 139/66, pulse 91, temperature 98.2 F (36.8 C), temperature source Oral, resp. rate 16, height 6' (1.829 m), weight 201 lb 14.4 oz (91.6 kg), SpO2 100 %. ECOG: 1 General appearance: Alert, awake gentleman without distress. Head: Normocephalic, without obvious abnormality no oral ulcers or lesions. Neck: no adenopathy Lymph nodes: Cervical, supraclavicular, and axillary nodes  normal. Heart:regular rate and rhythm, S1, S2 normal, no murmur, click, rub or gallop Lung:chest clear, no wheezing, rales, normal symmetric air entry. Abdomin: soft, non-tender, without masses or organomegaly no shifting dullness or ascites. EXT:no erythema, induration, or nodules Neurological: No deficits noted.  Lab Results: Lab Results  Component Value Date   WBC 11.1 (H) 04/04/2017   HGB 13.7 04/04/2017   HCT 40.5 04/04/2017   MCV 89.2 04/04/2017   PLT 281 04/04/2017     Chemistry      Component Value Date/Time   NA 138 04/04/2017 1030   K 3.8 04/04/2017 1030   CL 103 09/01/2015 1503   CL 105 08/09/2012 0918   CO2 23 04/04/2017 1030   BUN 17.0 04/04/2017 1030   CREATININE 1.3 04/04/2017 1030      Component Value Date/Time   CALCIUM 9.8 04/04/2017 1030   ALKPHOS 104 04/04/2017 1030   AST 12 04/04/2017 1030   ALT 11 04/04/2017 1030   BILITOT 0.40 04/04/2017 1030     Results for James [name] (MRN [id number]) as of 04/11/2017 16:26  Ref. Range 09/13/2016 14:53 11/15/2016 15:12 01/18/2017 11:18 04/04/2017 10:30  M Protein SerPl Elph-Mcnc Latest Ref Range: Not Observed g/dL 0.4 (H) 0.5 (H) 1.3 (H) 1.4 (H)        Impression and Plan:  71 year old with:  1. 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 He has active disease started in June 2017 without bone lesions detected on his skeletal survey.  He is S/P Revlimid at 25 mg daily with dexamethasone completed in January 2018 with a complete response.  His Protein studies obtained on 04/04/2017 were reviewed and discussed with the patient. His M spike is rising but no evidence to suggest symptomatic disease.  Risks and benefits of starting salvage therapy was discussed today versus observation and surveillance. The plan is to continue with close follow-up and institute therapy if he becomes symptomatic.  2.  Back pain and hip pain: Related to degenerative disc disease. MRI on 11/28/2016 showed  no evidence of myeloma lesions.  3. Bone health: Zometa has been offered in the past and have declined.   4.  Follow-up: In December 2018.   James Button, MD 9/12/20184:24 PM

## 2017-04-11 NOTE — Telephone Encounter (Signed)
Gave patient avs and calender for Nov.,and Dec. Upcoming aappointments. Per 9/12 los

## 2017-05-14 DIAGNOSIS — G8929 Other chronic pain: Secondary | ICD-10-CM | POA: Diagnosis not present

## 2017-05-14 DIAGNOSIS — Z6831 Body mass index (BMI) 31.0-31.9, adult: Secondary | ICD-10-CM | POA: Diagnosis not present

## 2017-05-14 DIAGNOSIS — I10 Essential (primary) hypertension: Secondary | ICD-10-CM | POA: Diagnosis not present

## 2017-05-14 DIAGNOSIS — S32011A Stable burst fracture of first lumbar vertebra, initial encounter for closed fracture: Secondary | ICD-10-CM | POA: Diagnosis not present

## 2017-05-23 DIAGNOSIS — Z23 Encounter for immunization: Secondary | ICD-10-CM | POA: Diagnosis not present

## 2017-05-24 DIAGNOSIS — N400 Enlarged prostate without lower urinary tract symptoms: Secondary | ICD-10-CM | POA: Diagnosis not present

## 2017-05-24 DIAGNOSIS — Z7984 Long term (current) use of oral hypoglycemic drugs: Secondary | ICD-10-CM | POA: Diagnosis not present

## 2017-05-24 DIAGNOSIS — E782 Mixed hyperlipidemia: Secondary | ICD-10-CM | POA: Diagnosis not present

## 2017-05-24 DIAGNOSIS — D509 Iron deficiency anemia, unspecified: Secondary | ICD-10-CM | POA: Diagnosis not present

## 2017-05-24 DIAGNOSIS — E1149 Type 2 diabetes mellitus with other diabetic neurological complication: Secondary | ICD-10-CM | POA: Diagnosis not present

## 2017-05-24 DIAGNOSIS — E1129 Type 2 diabetes mellitus with other diabetic kidney complication: Secondary | ICD-10-CM | POA: Diagnosis not present

## 2017-05-24 DIAGNOSIS — I10 Essential (primary) hypertension: Secondary | ICD-10-CM | POA: Diagnosis not present

## 2017-06-06 NOTE — Progress Notes (Signed)
Cardiology Office Note:    Date:  06/07/2017   ID:  Kevin Lang, DOB 01-Jun-1946, MRN [id number]  PCP:  Hendricks Limes, PA-C  Cardiologist:  Norman Herrlich, MD    Referring MD: Hendricks Limes, PA-C    ASSESSMENT:    1. Chronic diastolic heart failure (HCC)   2. Permanent atrial fibrillation (HCC)   3. 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 Hypertensive heart disease with heart failure (HCC)   5. Chronic anticoagulation    PLAN:    In order of problems listed above:  1. Stable compensated continue his current sodium restriction diuretic and I strongly encouraged him to weigh daily to assist and self-management 2. Stable rate controlled continue beta-blocker and anticoagulant 3. He requires a follow-up CT without contrast for progression scheduled today if greater than 55 mm would need to be considered for vascular intervention 4. Stable no evidence of volume overload continue his current diuretic blood pressure is stable continue treatment including beta-blocker ACE inhibitor and MRA 5. Stable continue his current anticoagulant he requires no adjustment for renal function and he is having no bleeding complication   Next appointment: 6 months   Medication Adjustments/Labs and Tests Ordered: Current medicines are reviewed at length with the patient today.  Concerns regarding medicines are outlined above.  Orders Placed This Encounter  Procedures  . CT Chest Wo Contrast  . Basic Metabolic Panel (BMET)  . Pro b natriuretic peptide (BNP)   No orders of the defined types were placed in this encounter.   Chief Complaint  Patient presents with  . Follow-up    History of Present Illness:    Kevin Lang is a 71 y.o. 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 Compliance with diet,  lifestyle and medications: Yes Feels improved from his last visit he has had no edema no daytime shortness of breath chest pain palpitation or syncope but is bothered by his inability to tolerate CPAP for sleep apnea.  Unfortunately he does not and will not weigh himself daily. Past Medical History:  Diagnosis Date  . Aneurysm of thoracic aorta (HCC) 02/16/2017   Overview:  Cardiac cath 12/08/15: Conclusions Diagnostic Procedure Summary 1. Mild non-obstructive CAD 2. Normal LV systolic function 3. Aortogram performed to eval aortic root aneurysm, 4.0 cm by this study.  CT Jan 2017, ascending aaorta aneurysm, 4.8 cm IMOUPDATE  . Benign essential hypertension 02/16/2017  . Cardiomyopathy (HCC) 08/23/2015  . Chronic anticoagulation 05/21/2016  . Chronic combined systolic and diastolic congestive heart failure (HCC) 02/16/2017  . CKD (chronic kidney disease) stage 3, GFR 30-59 ml/min (HCC) 12/31/2015  . COPD (chronic obstructive pulmonary disease) (HCC) 05/21/2016  . Mild CAD 05/21/2016   Overview:  Cardiac cath 12/08/15:Conclusions Diagnostic Procedure Summary 1. Mild non-obstructive CAD 2. Normal LV systolic function 3. Aortogram performed to eval aortic root aneurysm, 4.0 cm by this study. Diagnostic Procedure Recommendations  . Persistent atrial fibrillation (HCC) 02/16/2017    Past Surgical History:  Procedure Laterality Date  . CORONARY ANGIOPLASTY      Current Medications: Current Meds  Medication Sig  . apixaban (ELIQUIS) 5 MG TABS tablet Take 5 mg by mouth 2 (two) times daily.  Marland Kitchen atorvastatin (LIPITOR) 40 MG tablet Take 40 mg by mouth daily.  . cromolyn (NASALCROM) 5.2  MG/ACT nasal spray Place into the nose.  . diltiazem (CARDIZEM) 120 MG tablet Take 120 mg by mouth daily.  Marland Kitchen. lisinopril (PRINIVIL,ZESTRIL) 10 MG tablet Take 10 mg by mouth 2 (two) times daily.  . metoprolol tartrate (LOPRESSOR) 50 MG tablet Take 50 mg by mouth 2 (two) times daily.  . nitroGLYCERIN (NITROSTAT) 0.4 MG SL  tablet Place 0.4 mg under the tongue every 5 (five) hours as needed.  Marland Kitchen. spironolactone (ALDACTONE) 25 MG tablet Take 12.5 mg by mouth daily.  Marland Kitchen. torsemide (DEMADEX) 20 MG tablet Take 20 mg by mouth 2 (two) times daily.     Allergies:   Patient has no known allergies.   Social History   Socioeconomic History  . Marital status: Married    Spouse name: None  . Number of children: None  . Years of education: None  . Highest education level: None  Social Needs  . Financial resource strain: None  . Food insecurity - worry: None  . Food insecurity - inability: None  . Transportation needs - medical: None  . Transportation needs - non-medical: None  Occupational History  . None  Tobacco Use  . Smoking status: Former Smoker    Packs/day: 0.50    Years: 30.00    Pack years: 15.00    Types: Cigarettes    Last attempt to quit: 08/22/2000    Years since quitting: 16.8  . Smokeless tobacco: Never Used  Substance and Sexual Activity  . Alcohol use: No  . Drug use: No  . Sexual activity: None  Other Topics Concern  . None  Social History Narrative  . None     Family History: The patient's family history includes Aneurysm in his father; Heart disease in his brother; Hypertension in his mother; Stroke in his mother. ROS:   Please see the history of present illness.    All other systems reviewed and are negative.  EKGs/Labs/Other Studies Reviewed:    The following studies were reviewed today:  Recent Labs: 03/05/2017: BNP 112.0; BUN 41; Creatinine, Ser 1.95; Potassium 4.5; Sodium 147  Recent Lipid Panel No results found for: CHOL, TRIG, HDL, CHOLHDL, VLDL, LDLCALC, LDLDIRECT  Physical Exam:    VS:  BP 100/80 (BP Location: Right Arm, Patient Position: Sitting, Cuff Size: Normal)   Pulse 96   Ht 5\' 4"  (1.626 m)   Wt 232 lb (105.2 kg)   SpO2 95%   BMI 39.82 kg/m     Wt Readings from Last 3 Encounters:  06/07/17 232 lb (105.2 kg)  03/05/17 229 lb (103.9 kg)     GEN:  Well  nourished, well developed in no acute distress HEENT: Normal NECK: No JVD; No carotid bruits LYMPHATICS: No lymphadenopathy CARDIAC: Irr Irr variable s1RRR, no murmurs, rubs, gallops RESPIRATORY:  Clear to auscultation without rales, wheezing or rhonchi  ABDOMEN: Soft, non-tender, non-distended MUSCULOSKELETAL:  No edema; No deformity  SKIN: Warm and dry NEUROLOGIC:  Alert and oriented x 3 PSYCHIATRIC:  Normal affect    Signed, Norman HerrlichBrian Munley, MD  06/07/2017 9:09 AM    [facility]

## 2017-06-07 ENCOUNTER — Encounter: Payer: Self-pay | Admitting: Cardiology

## 2017-06-07 ENCOUNTER — Ambulatory Visit (HOSPITAL_BASED_OUTPATIENT_CLINIC_OR_DEPARTMENT_OTHER)
Admission: RE | Admit: 2017-06-07 | Discharge: 2017-06-07 | Disposition: A | Discharge status: Home / Self Care | Payer: Medicare Other | Source: Ambulatory Visit | Attending: Cardiology | Admitting: Cardiology

## 2017-06-07 ENCOUNTER — Ambulatory Visit (INDEPENDENT_AMBULATORY_CARE_PROVIDER_SITE_OTHER): Payer: Self-pay | Admitting: Cardiology

## 2017-06-07 ENCOUNTER — Other Ambulatory Visit: Payer: Self-pay | Admitting: Cardiology

## 2017-06-07 VITALS — BP 100/80 | HR 96 | Ht 64.0 in | Wt 232.0 lb

## 2017-06-07 DIAGNOSIS — I482 Chronic atrial fibrillation: Secondary | ICD-10-CM | POA: Diagnosis not present

## 2017-06-07 DIAGNOSIS — Z7901 Long term (current) use of anticoagulants: Secondary | ICD-10-CM | POA: Diagnosis not present

## 2017-06-07 DIAGNOSIS — I712 Thoracic aortic aneurysm, without rupture, unspecified: Secondary | ICD-10-CM

## 2017-06-07 DIAGNOSIS — I5032 Chronic diastolic (congestive) heart failure: Secondary | ICD-10-CM | POA: Diagnosis not present

## 2017-06-07 DIAGNOSIS — I700 Atherosclerosis of aorta: Secondary | ICD-10-CM | POA: Insufficient documentation

## 2017-06-07 DIAGNOSIS — I110 Hypertensive heart disease with heart failure: Secondary | ICD-10-CM

## 2017-06-07 DIAGNOSIS — J432 Centrilobular emphysema: Secondary | ICD-10-CM | POA: Insufficient documentation

## 2017-06-07 DIAGNOSIS — I2510 Atherosclerotic heart disease of native coronary artery without angina pectoris: Secondary | ICD-10-CM | POA: Insufficient documentation

## 2017-06-07 DIAGNOSIS — R918 Other nonspecific abnormal finding of lung field: Secondary | ICD-10-CM | POA: Insufficient documentation

## 2017-06-07 DIAGNOSIS — I4821 Permanent atrial fibrillation: Secondary | ICD-10-CM

## 2017-06-07 NOTE — Patient Instructions (Addendum)
Medication Instructions:  Your physician recommends that you continue on your current medications as directed. Please refer to the Current Medication list given to you today.  Labwork: Your physician recommends that you return for lab work in: today. BNP, BMP  Testing/Procedures: Non-Cardiac CT scanning, (CAT scanning), is a noninvasive, special x-ray that produces cross-sectional images of the body using x-rays and a computer. CT scans help physicians diagnose and treat medical conditions. For some CT exams, a contrast material is used to enhance visibility in the area of the body being studied. CT scans provide greater clarity and reveal more details than regular x-ray exams.  Follow-Up: Your physician wants you to follow-up in: 6 months. You will receive a reminder letter in the mail two months in advance. If you don't receive a letter, please call our office to schedule the follow-up appointment.  Any Other Special Instructions Will Be Listed Below (If Applicable).     If you need a refill on your cardiac medications before your next appointment, please call your pharmacy.    Heart Failure  Weigh yourself every morning when you first wake up and record on a calender or note pad, bring this to your office visits. Using a pill tender can help with taking your medications consistently.  Limit your fluid intake to 2 liters daily  Limit your sodium intake to less than 2-3 grams daily. Ask if you need dietary teaching.  If you gain more than 3 pounds (from your dry weight ), double your dose of diuretic for the day.  If you gain more than 5 pounds (from your dry weight), double your dose of lasix and call your heart failure doctor.  Please do not smoke tobacco since it is very bad for your heart.  Please do not drink alcohol since it can worsen your heart failure.Also avoid OTC nonsteroidal drugs, such as advil, aleve and motrin.  Try to exercise for at least 30 minutes every day  because this will help your heart be more efficient. You may be eligible for supervised cardiac rehab, ask your physician.

## 2017-06-08 LAB — BASIC METABOLIC PANEL
BUN/Creatinine Ratio: 20 (ref 10–24)
BUN: 34 mg/dL — AB (ref 8–27)
CALCIUM: 9.7 mg/dL (ref 8.6–10.2)
CO2: 28 mmol/L (ref 20–29)
CREATININE: 1.67 mg/dL — AB (ref 0.76–1.27)
Chloride: 101 mmol/L (ref 96–106)
GFR calc Af Amer: 46 mL/min/{1.73_m2} — ABNORMAL LOW (ref >59)
GFR calc non Af Amer: 40 mL/min/{1.73_m2} — ABNORMAL LOW (ref >59)
GLUCOSE: 110 mg/dL — AB (ref 65–99)
Potassium: 4.6 mmol/L (ref 3.5–5.2)
Sodium: 143 mmol/L (ref 134–144)

## 2017-06-08 LAB — PRO B NATRIURETIC PEPTIDE: NT-PRO BNP: 992 pg/mL — AB (ref 0–376)

## 2017-06-12 DIAGNOSIS — M5416 Radiculopathy, lumbar region: Secondary | ICD-10-CM | POA: Diagnosis not present

## 2017-06-26 ENCOUNTER — Other Ambulatory Visit (HOSPITAL_BASED_OUTPATIENT_CLINIC_OR_DEPARTMENT_OTHER): Payer: Medicare HMO

## 2017-06-26 DIAGNOSIS — C9000 Multiple myeloma not having achieved remission: Secondary | ICD-10-CM | POA: Diagnosis not present

## 2017-06-26 DIAGNOSIS — C9001 Multiple myeloma in remission: Secondary | ICD-10-CM

## 2017-06-26 LAB — COMPREHENSIVE METABOLIC PANEL
ALT: 14 U/L (ref 0–55)
AST: 11 U/L (ref 5–34)
Albumin: 3.6 g/dL (ref 3.5–5.0)
Alkaline Phosphatase: 75 U/L (ref 40–150)
Anion Gap: 12 mEq/L — ABNORMAL HIGH (ref 3–11)
BUN: 16.4 mg/dL (ref 7.0–26.0)
CO2: 25 mEq/L (ref 22–29)
Calcium: 9.3 mg/dL (ref 8.4–10.4)
Chloride: 101 mEq/L (ref 98–109)
Creatinine: 1.2 mg/dL (ref 0.7–1.3)
EGFR: >60 mL/min/{1.73_m2} (ref >60)
Glucose: 279 mg/dl — ABNORMAL HIGH (ref 70–140)
Potassium: 3.8 mEq/L (ref 3.5–5.1)
Sodium: 138 mEq/L (ref 136–145)
Total Bilirubin: 0.31 mg/dL (ref 0.20–1.20)
Total Protein: 8.1 g/dL (ref 6.4–8.3)

## 2017-06-26 LAB — CBC WITH DIFFERENTIAL/PLATELET
BASO%: 0.6 % (ref 0.0–2.0)
Basophils Absolute: 0.1 10*3/uL (ref 0.0–0.1)
EOS%: 1.1 % (ref 0.0–7.0)
Eosinophils Absolute: 0.1 10*3/uL (ref 0.0–0.5)
HCT: 40.8 % (ref 38.4–49.9)
HGB: 13.4 g/dL (ref 13.0–17.1)
LYMPH%: 19.7 % (ref 14.0–49.0)
MCH: 29.9 pg (ref 27.2–33.4)
MCHC: 32.9 g/dL (ref 32.0–36.0)
MCV: 90.7 fL (ref 79.3–98.0)
MONO#: 0.6 10*3/uL (ref 0.1–0.9)
MONO%: 6.9 % (ref 0.0–14.0)
NEUT#: 6.7 10*3/uL — ABNORMAL HIGH (ref 1.5–6.5)
NEUT%: 71.7 % (ref 39.0–75.0)
Platelets: 236 10*3/uL (ref 140–400)
RBC: 4.5 10*6/uL (ref 4.20–5.82)
RDW: 15.7 % — ABNORMAL HIGH (ref 11.0–14.6)
WBC: 9.3 10*3/uL (ref 4.0–10.3)
lymph#: 1.8 10*3/uL (ref 0.9–3.3)

## 2017-06-27 LAB — KAPPA/LAMBDA LIGHT CHAINS
Ig Kappa Free Light Chain: 13.8 mg/L (ref 3.3–19.4)
Ig Lambda Free Light Chain: 10.3 mg/L (ref 5.7–26.3)
Kappa/Lambda FluidC Ratio: 1.34 (ref 0.26–1.65)

## 2017-06-28 LAB — MULTIPLE MYELOMA PANEL, SERUM
Albumin SerPl Elph-Mcnc: 3.6 g/dL (ref 2.9–4.4)
Albumin/Glob SerPl: 1 (ref 0.7–1.7)
Alpha 1: 0.2 g/dL (ref 0.0–0.4)
Alpha2 Glob SerPl Elph-Mcnc: 0.8 g/dL (ref 0.4–1.0)
B-Globulin SerPl Elph-Mcnc: 2.3 g/dL — ABNORMAL HIGH (ref 0.7–1.3)
Gamma Glob SerPl Elph-Mcnc: 0.6 g/dL (ref 0.4–1.8)
Globulin, Total: 3.9 g/dL (ref 2.2–3.9)
IgA, Qn, Serum: 1952 mg/dL — ABNORMAL HIGH (ref 61–437)
IgG, Qn, Serum: 666 mg/dL — ABNORMAL LOW (ref 700–1600)
IgM, Qn, Serum: 11 mg/dL — ABNORMAL LOW (ref 15–143)
M Protein SerPl Elph-Mcnc: 1.7 g/dL — ABNORMAL HIGH
Total Protein: 7.5 g/dL (ref 6.0–8.5)

## 2017-07-03 ENCOUNTER — Ambulatory Visit (HOSPITAL_BASED_OUTPATIENT_CLINIC_OR_DEPARTMENT_OTHER): Payer: Medicare HMO | Admitting: Oncology

## 2017-07-03 ENCOUNTER — Telehealth: Payer: Self-pay | Admitting: Oncology

## 2017-07-03 VITALS — BP 177/81 | HR 85 | Temp 98.3°F | Resp 18 | Ht 72.0 in | Wt 209.4 lb

## 2017-07-03 DIAGNOSIS — C9001 Multiple myeloma in remission: Secondary | ICD-10-CM | POA: Diagnosis not present

## 2017-07-03 NOTE — Telephone Encounter (Signed)
Gave avs and calendar for February and March 2019 °

## 2017-07-03 NOTE — Progress Notes (Signed)
Hematology and Oncology Follow Up Visit  James [name] [id number] [birth year] 71 y.o. 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 Active myeloma diagnosed in June 2017 with an M spike of 3 g/dL and an IgA level of 3700. He was found to have mild anemia and bone marrow involvement. Bone marrow biopsy in April 2016 showed plasma cell myeloma with percentage ranging between 14-30%. Skeletal survey in June 2017 showed no bone lesions  Prior therapy: Revlimid and dexamethasone started on January 28 2016. Revlimid at 25 mg daily for 21 days with dexamethasone at 20 mg weekly. Therapy discontinued in January 2018. He achieved a near complete response to therapy.  Current therapy: Observation and surveillance.   Interim History:  James [name] returns today for a follow up visit. Since the last visit, he reports being about the same.  He continues to have chronic pain issues that are unrelated to his multiple myeloma.  He continues to ambulate without any help of a walker or cane. He denied any recent falls or syncope. He denied recent pathological fractures or infections. He denied any peripheral neuropathy.  He denies any recent hospitalizations or infections.  He denies any excessive fatigue or tiredness.  His appetite remained about the same.  He does not report any headaches, blurry vision, syncope or seizures. He does not report any fevers or chills or sweats. Does not report any cough or hemoptysis or hematemesis. He does not report any chest pain palpitation or orthopnea. He reports no nausea or vomiting or abdominal pain. Does not report any frequency urgency or hesitancy. He does not report any lymphadenopathy or petechiae. The remaining review of systems unremarkable.    Medications: I have reviewed the patient's current medications.  Current Outpatient Medications  Medication Sig Dispense  Refill  . amLODipine (NORVASC) 10 MG tablet Take 10 mg by mouth every morning.     . famotidine (PEPCID) 20 MG tablet Take 1 tablet (20 mg total) by mouth 2 (two) times daily. 10 tablet 0  . ferrous sulfate 325 (65 FE) MG tablet Take 325 mg by mouth 2 (two) times daily with a meal.     . finasteride (PROSCAR) 5 MG tablet Take 5 mg by mouth daily.    Marland Kitchen gabapentin (NEURONTIN) 400 MG capsule Take 400 mg by mouth 3 (three) times daily.     Marland Kitchen glimepiride (AMARYL) 2 MG tablet Take 2 mg by mouth daily.    Marland Kitchen HYDROcodone-acetaminophen (NORCO) 10-325 MG tablet Take 1 tablet by mouth.    . losartan (COZAAR) 50 MG tablet Take 50 mg by mouth every morning.     . metFORMIN (GLUCOPHAGE-XR) 500 MG 24 hr tablet Take 1,000 mg by mouth 2 (two) times daily.     Marland Kitchen omeprazole (PRILOSEC) 20 MG capsule Take 20 mg by mouth 4 (four) times daily.     . Tamsulosin HCl (FLOMAX) 0.4 MG CAPS Take 0.4 mg by mouth 2 (two) times daily.     . traMADol (ULTRAM) 50 MG tablet Take 50 mg by mouth 4 (four) times daily -  with meals and at bedtime. For pain     No current facility-administered medications for this visit.     Allergies:  Allergies  Allergen Reactions  . Invokana [Canagliflozin] Anaphylaxis    Facial and lip swelling    Past Medical History, Surgical history, Social history, and Family History were reviewed and updated.  Physical Exam: Blood pressure (!) 177/81, pulse 85, temperature 98.3 F (36.8 C), temperature source Oral, resp. rate 18, height 6' (1.829 m), weight 209 lb 6.4 oz (95 kg), SpO2 100 %. ECOG: 1 General appearance: Well-appearing gentleman appeared comfortable. Head: Normocephalic, without obvious abnormality no oral thrush or ulcers. Neck: no adenopathy Lymph nodes: Cervical, supraclavicular, and axillary nodes normal. Heart:regular rate and rhythm, S1, S2 normal, no murmur, click, rub or gallop Lung:chest clear, no wheezing, rales, normal symmetric air entry. Abdomin: soft, non-tender,  without masses or organomegaly no rebound or guarding. EXT:no erythema, induration, or nodules Neurological: No deficits noted.  Lab Results: Lab Results  Component Value Date   WBC 9.3 06/26/2017   HGB 13.4 06/26/2017   HCT 40.8 06/26/2017   MCV 90.7 06/26/2017   PLT 236 06/26/2017     Chemistry      Component Value Date/Time   NA 138 06/26/2017 0946   K 3.8 06/26/2017 0946   CL 103 09/01/2015 1503   CL 105 08/09/2012 0918   CO2 25 06/26/2017 0946   BUN 16.4 06/26/2017 0946   CREATININE 1.2 06/26/2017 0946      Component Value Date/Time   CALCIUM 9.3 06/26/2017 0946   ALKPHOS 75 06/26/2017 0946   AST 11 06/26/2017 0946   ALT 14 06/26/2017 0946   BILITOT 0.31 06/26/2017 0946       Results for James, [name] (MRN [id number]) as of 07/03/2017 15:15  Ref. Range 04/04/2017 10:30 04/04/2017 10:30 06/26/2017 09:46 06/26/2017 09:46  M Protein SerPl Elph-Mcnc Latest Ref Range: Not Observed g/dL 1.4 (H)   1.7 (H)       Impression and Plan:  71 year old with:  1. 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 He has active disease started in June 2017 without bone lesions detected on his skeletal survey.  He is S/P Revlimid at 25 mg daily with dexamethasone completed in January 2018 with a complete response.  His protein studies obtained on June 26, 2017 was reviewed today and showed no dramatic changes.  His M spike remains slightly elevated although he is asymptomatic without any evidence of endorgan damage.   Risks and benefits of starting salvage therapy was discussed today versus observation and surveillance. The plan is to continue with close follow-up and consider therapy if any signs or symptoms of endorgan damage start to develop.  2.  Back pain and hip pain: Related to degenerative disc disease. MRI on 11/28/2016 showed no evidence of myeloma lesions.  3. Bone health: Zometa has been offered in the past and have declined.   4.   Follow-up: In 09/2017   Zola Button, MD 12/4/20183:31 PM

## 2017-07-05 ENCOUNTER — Other Ambulatory Visit: Payer: Self-pay

## 2017-07-05 MED ORDER — TORSEMIDE 20 MG PO TABS: 20.0000 mg | ORAL_TABLET | Freq: Two times a day (BID) | ORAL | 3 refills | Status: DC

## 2017-07-06 ENCOUNTER — Other Ambulatory Visit: Payer: Self-pay

## 2017-07-06 MED ORDER — TORSEMIDE 20 MG PO TABS: 20.0000 mg | ORAL_TABLET | Freq: Two times a day (BID) | ORAL | 3 refills | Status: DC

## 2017-07-16 ENCOUNTER — Telehealth: Payer: Self-pay | Admitting: Cardiology

## 2017-07-16 NOTE — Telephone Encounter (Signed)
Left message to patient stating that torsemide was sent to [facility] on [address] on 07/06/17 for 90 day supply and 3 refills, with a confirmed receipt from the pharmacy. Advised to contact the pharmacy and verify that they have refills on file, if not to call back.

## 2017-07-16 NOTE — Telephone Encounter (Signed)
°*  STAT* If patient is at the pharmacy, call can be transferred to refill team.   1. Which medications need to be refilled? (please list name of each medication and dose if known) Torsemide 20mg   2. Which pharmacy/location (including street and city if local pharmacy) is medication to be sent to? [facility] [address]  3. Do they need a 30 day or 90 day supply? 90

## 2017-07-19 DIAGNOSIS — M5136 Other intervertebral disc degeneration, lumbar region: Secondary | ICD-10-CM | POA: Diagnosis not present

## 2017-07-19 DIAGNOSIS — C9001 Multiple myeloma in remission: Secondary | ICD-10-CM | POA: Diagnosis not present

## 2017-07-19 DIAGNOSIS — M5416 Radiculopathy, lumbar region: Secondary | ICD-10-CM | POA: Diagnosis not present

## 2017-08-03 DIAGNOSIS — R351 Nocturia: Secondary | ICD-10-CM | POA: Diagnosis not present

## 2017-08-03 DIAGNOSIS — N401 Enlarged prostate with lower urinary tract symptoms: Secondary | ICD-10-CM | POA: Diagnosis not present

## 2017-08-03 DIAGNOSIS — R310 Gross hematuria: Secondary | ICD-10-CM | POA: Diagnosis not present

## 2017-08-03 DIAGNOSIS — N5201 Erectile dysfunction due to arterial insufficiency: Secondary | ICD-10-CM | POA: Diagnosis not present

## 2017-08-10 DIAGNOSIS — E782 Mixed hyperlipidemia: Secondary | ICD-10-CM | POA: Diagnosis not present

## 2017-08-10 DIAGNOSIS — K219 Gastro-esophageal reflux disease without esophagitis: Secondary | ICD-10-CM | POA: Diagnosis not present

## 2017-08-10 DIAGNOSIS — I10 Essential (primary) hypertension: Secondary | ICD-10-CM | POA: Diagnosis not present

## 2017-08-10 DIAGNOSIS — C9000 Multiple myeloma not having achieved remission: Secondary | ICD-10-CM | POA: Diagnosis not present

## 2017-08-10 DIAGNOSIS — G8929 Other chronic pain: Secondary | ICD-10-CM | POA: Diagnosis not present

## 2017-08-10 DIAGNOSIS — R809 Proteinuria, unspecified: Secondary | ICD-10-CM | POA: Diagnosis not present

## 2017-08-10 DIAGNOSIS — E1129 Type 2 diabetes mellitus with other diabetic kidney complication: Secondary | ICD-10-CM | POA: Diagnosis not present

## 2017-08-10 DIAGNOSIS — N400 Enlarged prostate without lower urinary tract symptoms: Secondary | ICD-10-CM | POA: Diagnosis not present

## 2017-08-10 DIAGNOSIS — G629 Polyneuropathy, unspecified: Secondary | ICD-10-CM | POA: Diagnosis not present

## 2017-08-10 DIAGNOSIS — E1149 Type 2 diabetes mellitus with other diabetic neurological complication: Secondary | ICD-10-CM | POA: Diagnosis not present

## 2017-08-13 DIAGNOSIS — I10 Essential (primary) hypertension: Secondary | ICD-10-CM | POA: Diagnosis not present

## 2017-08-13 DIAGNOSIS — S32011A Stable burst fracture of first lumbar vertebra, initial encounter for closed fracture: Secondary | ICD-10-CM | POA: Diagnosis not present

## 2017-08-13 DIAGNOSIS — Z6831 Body mass index (BMI) 31.0-31.9, adult: Secondary | ICD-10-CM | POA: Diagnosis not present

## 2017-08-15 IMAGING — CR DG RIBS W/ CHEST 3+V*L*
3 series · 3 of 3 positions shown · non-contrast
Comparison: 09/01/2015

CLINICAL DATA: Left lateral and posterior ribcage pain.

EXAM:
LEFT RIBS AND CHEST - 3+ VIEW

[w chest pa]
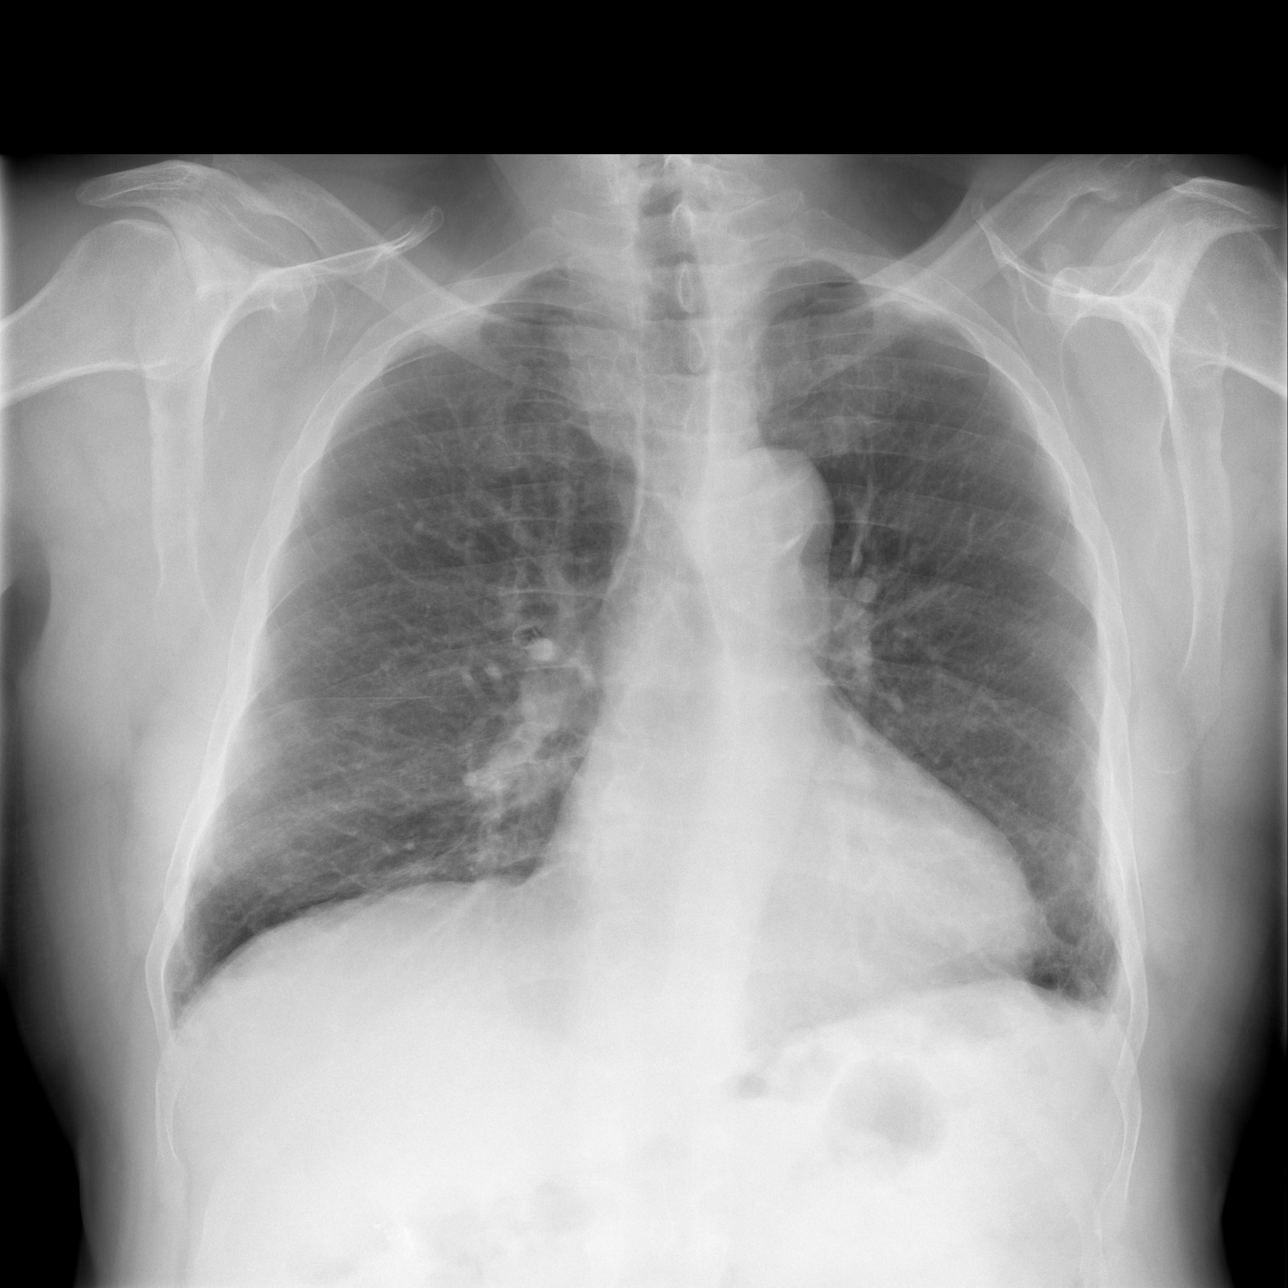

[w ribs ap/pa upper left]
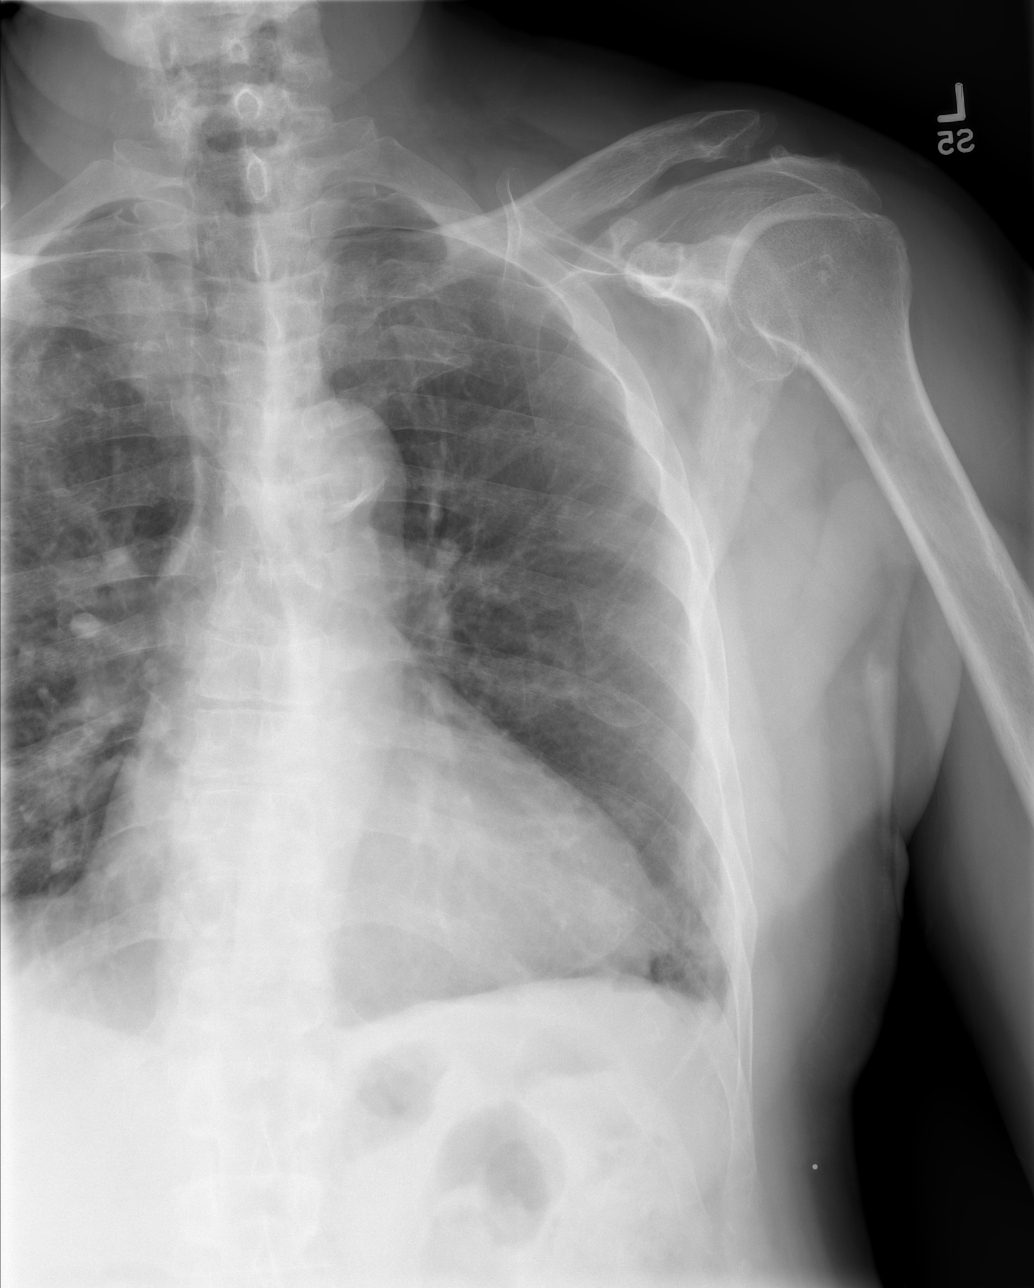

[w ribs ap/pa lower left]
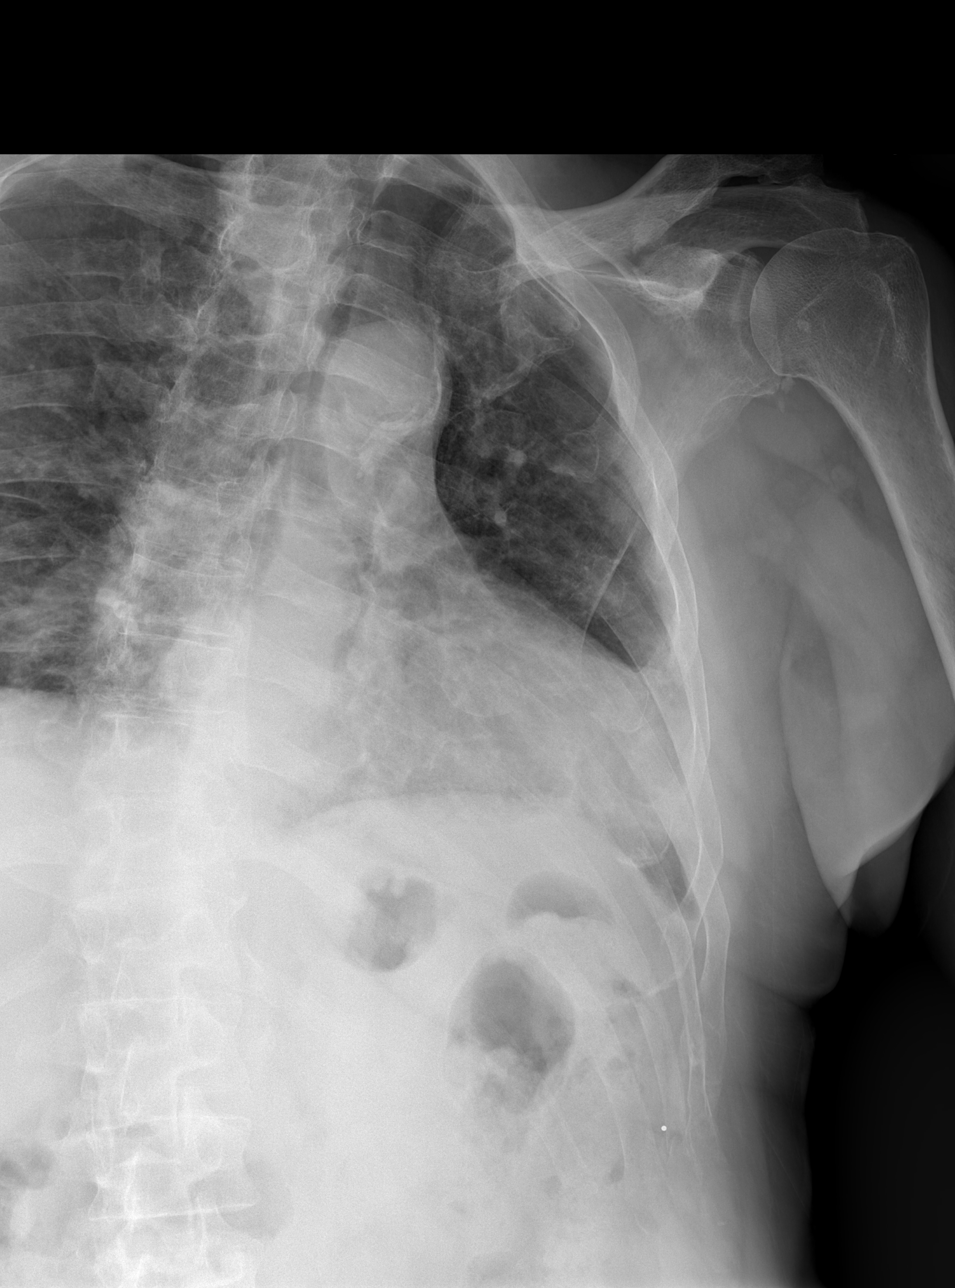

[3 of 3 positions shown; findings below may reference images not displayed]

FINDINGS: There is hyperinflation of the lungs compatible with COPD. Bibasilar
scarring. Stable blunting of the left costophrenic angle, likely
scar/pleural thickening. Heart is normal size. No confluent
opacities or effusions. There appears to be a healing left lateral
rib fracture involving the lateral eighth rib. No pneumothorax.
IMPRESSION: COPD/chronic changes.  No active disease.

Healing lateral left eighth rib fracture.

## 2017-08-17 DIAGNOSIS — R310 Gross hematuria: Secondary | ICD-10-CM | POA: Diagnosis not present

## 2017-08-17 DIAGNOSIS — N200 Calculus of kidney: Secondary | ICD-10-CM | POA: Diagnosis not present

## 2017-08-21 DIAGNOSIS — R310 Gross hematuria: Secondary | ICD-10-CM | POA: Diagnosis not present

## 2017-08-21 DIAGNOSIS — N401 Enlarged prostate with lower urinary tract symptoms: Secondary | ICD-10-CM | POA: Diagnosis not present

## 2017-08-21 DIAGNOSIS — R351 Nocturia: Secondary | ICD-10-CM | POA: Diagnosis not present

## 2017-08-23 DIAGNOSIS — I160 Hypertensive urgency: Secondary | ICD-10-CM | POA: Diagnosis not present

## 2017-08-23 DIAGNOSIS — J209 Acute bronchitis, unspecified: Secondary | ICD-10-CM | POA: Diagnosis not present

## 2017-08-23 DIAGNOSIS — J0100 Acute maxillary sinusitis, unspecified: Secondary | ICD-10-CM | POA: Diagnosis not present

## 2017-09-06 ENCOUNTER — Telehealth: Payer: Self-pay | Admitting: Cardiology

## 2017-09-06 NOTE — Telephone Encounter (Signed)
Patient's daughter states that a physician note needs to be sent to Dr. Selinda Michaels at the [facility] regarding patient taking torsemide. The fax number is [phone].

## 2017-09-06 NOTE — Telephone Encounter (Signed)
Left message to return call 

## 2017-09-06 NOTE — Telephone Encounter (Signed)
Faxing office note from 03/05/17 where Dr. Dulce Sellar states to discontinue furosemide and continue on torsemide to number listed below.

## 2017-09-12 DIAGNOSIS — C9001 Multiple myeloma in remission: Secondary | ICD-10-CM | POA: Diagnosis not present

## 2017-09-12 DIAGNOSIS — M5136 Other intervertebral disc degeneration, lumbar region: Secondary | ICD-10-CM | POA: Diagnosis not present

## 2017-09-12 DIAGNOSIS — Z79899 Other long term (current) drug therapy: Secondary | ICD-10-CM | POA: Diagnosis not present

## 2017-09-27 ENCOUNTER — Inpatient Hospital Stay: Payer: Medicare HMO | Attending: Oncology

## 2017-09-27 DIAGNOSIS — C9001 Multiple myeloma in remission: Secondary | ICD-10-CM

## 2017-09-27 DIAGNOSIS — C9000 Multiple myeloma not having achieved remission: Secondary | ICD-10-CM | POA: Insufficient documentation

## 2017-09-27 DIAGNOSIS — Z9221 Personal history of antineoplastic chemotherapy: Secondary | ICD-10-CM | POA: Insufficient documentation

## 2017-09-27 LAB — COMPREHENSIVE METABOLIC PANEL
ALT: 13 U/L (ref 0–55)
AST: 12 U/L (ref 5–34)
Albumin: 3.9 g/dL (ref 3.5–5.0)
Alkaline Phosphatase: 79 U/L (ref 40–150)
Anion gap: 14 — ABNORMAL HIGH (ref 3–11)
BUN: 12 mg/dL (ref 7–26)
CO2: 21 mmol/L — ABNORMAL LOW (ref 22–29)
Calcium: 9.6 mg/dL (ref 8.4–10.4)
Chloride: 105 mmol/L (ref 98–109)
Creatinine, Ser: 1.18 mg/dL (ref 0.70–1.30)
GFR calc Af Amer: >60 mL/min (ref >60)
GFR calc non Af Amer: 59 mL/min — ABNORMAL LOW (ref >60)
Glucose, Bld: 112 mg/dL (ref 70–140)
Potassium: 3.7 mmol/L (ref 3.5–5.1)
Sodium: 140 mmol/L (ref 136–145)
Total Bilirubin: 0.3 mg/dL (ref 0.2–1.2)
Total Protein: 8.3 g/dL (ref 6.4–8.3)

## 2017-09-27 LAB — CBC WITH DIFFERENTIAL/PLATELET
Basophils Absolute: 0 10*3/uL (ref 0.0–0.1)
Basophils Relative: 1 %
Eosinophils Absolute: 0.1 10*3/uL (ref 0.0–0.5)
Eosinophils Relative: 1 %
HCT: 36.4 % — ABNORMAL LOW (ref 38.4–49.9)
Hemoglobin: 12.3 g/dL — ABNORMAL LOW (ref 13.0–17.1)
Lymphocytes Relative: 29 %
Lymphs Abs: 2 10*3/uL (ref 0.9–3.3)
MCH: 30 pg (ref 27.2–33.4)
MCHC: 33.9 g/dL (ref 32.0–36.0)
MCV: 88.6 fL (ref 79.3–98.0)
Monocytes Absolute: 0.5 10*3/uL (ref 0.1–0.9)
Monocytes Relative: 8 %
Neutro Abs: 4.3 10*3/uL (ref 1.5–6.5)
Neutrophils Relative %: 61 %
Platelets: 231 10*3/uL (ref 140–400)
RBC: 4.11 MIL/uL — ABNORMAL LOW (ref 4.20–5.82)
RDW: 13.8 % (ref 11.0–14.6)
WBC: 7 10*3/uL (ref 4.0–10.3)

## 2017-09-28 LAB — KAPPA/LAMBDA LIGHT CHAINS
Kappa free light chain: 22 mg/L — ABNORMAL HIGH (ref 3.3–19.4)
Kappa, lambda light chain ratio: 1.68 — ABNORMAL HIGH (ref 0.26–1.65)
Lambda free light chains: 13.1 mg/L (ref 5.7–26.3)

## 2017-10-01 ENCOUNTER — Telehealth: Payer: Self-pay | Admitting: Cardiology

## 2017-10-01 LAB — MULTIPLE MYELOMA PANEL, SERUM
Albumin SerPl Elph-Mcnc: 4 g/dL (ref 2.9–4.4)
Albumin/Glob SerPl: 1.1 (ref 0.7–1.7)
Alpha 1: 0.2 g/dL (ref 0.0–0.4)
Alpha2 Glob SerPl Elph-Mcnc: 0.8 g/dL (ref 0.4–1.0)
B-Globulin SerPl Elph-Mcnc: 2.3 g/dL — ABNORMAL HIGH (ref 0.7–1.3)
Gamma Glob SerPl Elph-Mcnc: 0.5 g/dL (ref 0.4–1.8)
Globulin, Total: 3.8 g/dL (ref 2.2–3.9)
IgA: 1863 mg/dL — ABNORMAL HIGH (ref 61–437)
IgG (Immunoglobin G), Serum: 742 mg/dL (ref 700–1600)
IgM (Immunoglobulin M), Srm: 20 mg/dL (ref 15–143)
M Protein SerPl Elph-Mcnc: 1.4 g/dL — ABNORMAL HIGH
Total Protein ELP: 7.8 g/dL (ref 6.0–8.5)

## 2017-10-01 NOTE — Telephone Encounter (Signed)
Follow up    Daughter calling to request letter faxed to [facility] explaining why patient was taken off furosemide. Please fax to  [phone] Attn: Dr Selinda Michaels

## 2017-10-01 NOTE — Telephone Encounter (Signed)
Carla advised letter has been faxed.

## 2017-10-04 ENCOUNTER — Telehealth: Payer: Self-pay | Admitting: Oncology

## 2017-10-04 ENCOUNTER — Inpatient Hospital Stay: Payer: Medicare HMO | Attending: Oncology | Admitting: Oncology

## 2017-10-04 VITALS — BP 157/80 | HR 99 | Temp 98.4°F | Resp 19 | Ht 72.0 in | Wt 206.6 lb

## 2017-10-04 DIAGNOSIS — D649 Anemia, unspecified: Secondary | ICD-10-CM | POA: Diagnosis not present

## 2017-10-04 DIAGNOSIS — Z79899 Other long term (current) drug therapy: Secondary | ICD-10-CM | POA: Diagnosis not present

## 2017-10-04 DIAGNOSIS — C9000 Multiple myeloma not having achieved remission: Secondary | ICD-10-CM | POA: Diagnosis not present

## 2017-10-04 DIAGNOSIS — C9001 Multiple myeloma in remission: Secondary | ICD-10-CM

## 2017-10-04 NOTE — Progress Notes (Signed)
Hematology and Oncology Follow Up Visit  James [name] [id number] Sep 25, 1945 72 y.o. 10/04/2017 4:38 PM James [name], James [name], MDEhinger, James Baltimore, MD   Principle Diagnosis: 71 year old man with IgA multiple myeloma diagnosed in June 2017 with an M spike of 3 g/dL and an IgA level of 3700. Bone marrow biopsy in April 2016 showed plasma cell myeloma with percentage ranging between 14-30%. No skeletal involvement noted.  Prior therapy:  Revlimid and dexamethasone started on January 28 2016. Revlimid at 25 mg daily for 21 days with dexamethasone at 20 mg weekly. Therapy discontinued in January 2018 after achieving a complete response.  Current therapy: Active surveillance.   Interim History:  James [name] is here for a follow-up visit. He reports no major changes in his health or he does have few complaints. Continues to have chronic back pain issues and is back on a fentanyl patch and uses morphine for breakthrough. His pain is predominantly related to bulging disc and spinal stenosis.  He also developed symptoms of hematuria and was evaluated by Dr. Tresa [name] and a biopsy did not show evidence of prostate cancer. His urine flow is frequent with periodic hematuria. He is under consideration for further urological surgery. His appetite remained reasonable and he continues to attempt activities of daily living. No pathological fractures or new bone pain.   He does not report any headaches, blurry vision, syncope or seizures. He does not report any fevers or chills or sweats. Appetite remained unchanged. Does not report any cough or hemoptysis or hematemesis. He does not report any chest pain palpitation or orthopnea. He reports no nausea or vomiting or abdominal pain. He does not report any skin rashes or lesions. He does not report any dysuria but does report frequency. He does not report any lymphadenopathy or petechiae. The remaining review of systems is negative.    Medications: I have reviewed the  patient's current medications.  Current Outpatient Medications  Medication Sig Dispense Refill  . amLODipine (NORVASC) 10 MG tablet Take 10 mg by mouth every morning.     . famotidine (PEPCID) 20 MG tablet Take 1 tablet (20 mg total) by mouth 2 (two) times daily. 10 tablet 0  . ferrous sulfate 325 (65 FE) MG tablet Take 325 mg by mouth 2 (two) times daily with a meal.     . finasteride (PROSCAR) 5 MG tablet Take 5 mg by mouth daily.    Marland Kitchen gabapentin (NEURONTIN) 400 MG capsule Take 400 mg by mouth 3 (three) times daily.     Marland Kitchen glimepiride (AMARYL) 2 MG tablet Take 2 mg by mouth daily.    Marland Kitchen HYDROcodone-acetaminophen (NORCO) 10-325 MG tablet Take 1 tablet by mouth.    . losartan (COZAAR) 50 MG tablet Take 50 mg by mouth every morning.     . metFORMIN (GLUCOPHAGE-XR) 500 MG 24 hr tablet Take 1,000 mg by mouth 2 (two) times daily.     Marland Kitchen omeprazole (PRILOSEC) 20 MG capsule Take 20 mg by mouth 4 (four) times daily.     . Tamsulosin HCl (FLOMAX) 0.4 MG CAPS Take 0.4 mg by mouth 2 (two) times daily.     . traMADol (ULTRAM) 50 MG tablet Take 50 mg by mouth 4 (four) times daily -  with meals and at bedtime. For pain     No current facility-administered medications for this visit.     Allergies:  Allergies  Allergen Reactions  . Invokana [Canagliflozin] Anaphylaxis    Facial and lip swelling  Past Medical History, Surgical history, Social history, and Family History were reviewed and updated.   Physical Exam: Blood pressure (!) 157/80, pulse 99, temperature 98.4 F (36.9 C), temperature source Oral, resp. rate 19, height 6' (1.829 m), weight 206 lb 9.6 oz (93.7 kg), SpO2 96 %. ECOG: 1 General appearance: Alert, awake gentleman appeared without distress today. Head: Normocephalic, without obvious abnormality  Oropharynx: no oral thrush or ulcers. Eyes: No scleral icterus. Lymph nodes: Cervical, supraclavicular, and axillary nodes normal. Heart:regular rate and rhythm, S1, S2 normal, no  murmur, click, rub or gallop Lung: Clear to auscultation without rhonchi or wheezes. Abdomin: Soft, nontender without any rebound or guarding. Musculoskeletal: No joint deformity or effusion. Neurological: No motor or sensory deficits.  Lab Results: Lab Results  Component Value Date   WBC 7.0 09/27/2017   HGB 12.3 (L) 09/27/2017   HCT 36.4 (L) 09/27/2017   MCV 88.6 09/27/2017   PLT 231 09/27/2017     Chemistry      Component Value Date/Time   NA 140 09/27/2017 1417   NA 138 06/26/2017 0946   K 3.7 09/27/2017 1417   K 3.8 06/26/2017 0946   CL 105 09/27/2017 1417   CL 105 08/09/2012 0918   CO2 21 (L) 09/27/2017 1417   CO2 25 06/26/2017 0946   BUN 12 09/27/2017 1417   BUN 16.4 06/26/2017 0946   CREATININE 1.18 09/27/2017 1417   CREATININE 1.2 06/26/2017 0946      Component Value Date/Time   CALCIUM 9.6 09/27/2017 1417   CALCIUM 9.3 06/26/2017 0946   ALKPHOS 79 09/27/2017 1417   ALKPHOS 75 06/26/2017 0946   AST 12 09/27/2017 1417   AST 11 06/26/2017 0946   ALT 13 09/27/2017 1417   ALT 14 06/26/2017 0946   BILITOT 0.3 09/27/2017 1417   BILITOT 0.31 06/26/2017 0946       Results for James [name] (MRN [id number]) as of 10/04/2017 16:44  Ref. Range 04/04/2017 10:30 06/26/2017 09:46 09/27/2017 14:17 09/27/2017 14:18  M Protein SerPl Elph-Mcnc Latest Ref Range: Not Observed g/dL 1.4 (H) 1.7 (H)  1.4 (H)  IFE 1 Unknown Comment Comment  Comment       Impression and Plan:  72 year old with:  1. IgA Kappa Multiple myeloma diagnosed in June 2017. He presented with increased plasma cell in the bone marrow and worsening anemia.  He is S/P Revlimid at 25 mg daily with dexamethasone completed in January 2018 with a complete response.  His protein studies obtained on 09/27/2017 personally reviewed today and discussed with the patient. His M spike remains relatively stable in the last 6 months with slightly elevated IgA level.  The rationale for starting systemic  therapy and the natural course of this disease was discussed today with the patient. Risks and benefits of restarting Revlimid was reviewed today. Given the fact that his disease is asymptomatic he would like to defer active treatment unless he is symptomatic.  After discussion today, the plan is to continue with active surveillance and restart and all myeloma therapy. He develops symptomatic progression.  2.  Back pain and hip pain: Related to degenerative disc disease. He is under evaluation for potential surgery. MRI in the past did not show any evidence of myeloma involvement.  3. Hematuria: He is evaluated by Dr. Tresa [name] and is considering further urological surgery. No evidence of malignancy detected at this time.  4.  Follow-up: In in 3 months to follow his progress.  15  minutes was  spent with the patient face-to-face today.  More than 50% of time was dedicated to patient counseling, education and coordination of his multifaceted care.    Zola Button, MD 3/7/20194:38 PM

## 2017-10-04 NOTE — Telephone Encounter (Signed)
Gave patient avs and calendar with appts per 3/7 los.  °

## 2017-10-10 DIAGNOSIS — E1142 Type 2 diabetes mellitus with diabetic polyneuropathy: Secondary | ICD-10-CM | POA: Diagnosis not present

## 2017-10-10 DIAGNOSIS — I10 Essential (primary) hypertension: Secondary | ICD-10-CM | POA: Diagnosis not present

## 2017-10-10 DIAGNOSIS — R69 Illness, unspecified: Secondary | ICD-10-CM | POA: Diagnosis not present

## 2017-10-10 DIAGNOSIS — B182 Chronic viral hepatitis C: Secondary | ICD-10-CM | POA: Diagnosis not present

## 2017-10-10 DIAGNOSIS — I252 Old myocardial infarction: Secondary | ICD-10-CM | POA: Diagnosis not present

## 2017-10-10 DIAGNOSIS — G8929 Other chronic pain: Secondary | ICD-10-CM | POA: Diagnosis not present

## 2017-10-10 DIAGNOSIS — H547 Unspecified visual loss: Secondary | ICD-10-CM | POA: Diagnosis not present

## 2017-10-10 DIAGNOSIS — E1143 Type 2 diabetes mellitus with diabetic autonomic (poly)neuropathy: Secondary | ICD-10-CM | POA: Diagnosis not present

## 2017-10-10 DIAGNOSIS — C9000 Multiple myeloma not having achieved remission: Secondary | ICD-10-CM | POA: Diagnosis not present

## 2017-10-22 DIAGNOSIS — I10 Essential (primary) hypertension: Secondary | ICD-10-CM | POA: Diagnosis not present

## 2017-10-22 DIAGNOSIS — F1120 Opioid dependence, uncomplicated: Secondary | ICD-10-CM | POA: Insufficient documentation

## 2017-10-22 DIAGNOSIS — M48061 Spinal stenosis, lumbar region without neurogenic claudication: Secondary | ICD-10-CM | POA: Insufficient documentation

## 2017-10-22 DIAGNOSIS — M48062 Spinal stenosis, lumbar region with neurogenic claudication: Secondary | ICD-10-CM | POA: Diagnosis not present

## 2017-10-22 DIAGNOSIS — Z6830 Body mass index (BMI) 30.0-30.9, adult: Secondary | ICD-10-CM | POA: Diagnosis not present

## 2017-10-22 DIAGNOSIS — M545 Low back pain: Secondary | ICD-10-CM | POA: Diagnosis not present

## 2017-10-22 DIAGNOSIS — R69 Illness, unspecified: Secondary | ICD-10-CM | POA: Diagnosis not present

## 2017-10-22 DIAGNOSIS — Z79899 Other long term (current) drug therapy: Secondary | ICD-10-CM | POA: Diagnosis not present

## 2017-10-22 DIAGNOSIS — M961 Postlaminectomy syndrome, not elsewhere classified: Secondary | ICD-10-CM | POA: Diagnosis not present

## 2017-10-23 DIAGNOSIS — M48062 Spinal stenosis, lumbar region with neurogenic claudication: Secondary | ICD-10-CM | POA: Diagnosis not present

## 2017-11-12 DIAGNOSIS — I10 Essential (primary) hypertension: Secondary | ICD-10-CM | POA: Diagnosis not present

## 2017-11-12 DIAGNOSIS — Z6830 Body mass index (BMI) 30.0-30.9, adult: Secondary | ICD-10-CM | POA: Diagnosis not present

## 2017-11-12 DIAGNOSIS — M48062 Spinal stenosis, lumbar region with neurogenic claudication: Secondary | ICD-10-CM | POA: Diagnosis not present

## 2017-12-04 DIAGNOSIS — M6530 Trigger finger, unspecified finger: Secondary | ICD-10-CM | POA: Diagnosis not present

## 2017-12-11 DIAGNOSIS — M79641 Pain in right hand: Secondary | ICD-10-CM | POA: Diagnosis not present

## 2017-12-11 DIAGNOSIS — M65351 Trigger finger, right little finger: Secondary | ICD-10-CM | POA: Diagnosis not present

## 2017-12-11 NOTE — Progress Notes (Signed)
Cardiology Office Note:    Date:  12/13/2017   ID:  [name], DOB Nov 28, 1945, MRN [id number]  PCP:  Cathlyn Parsons, PA-C  Cardiologist:  Shirlee More, MD    Referring MD: Cathlyn Parsons, PA-C    ASSESSMENT:    1. Chronic diastolic heart failure ([place])   2. Hypertensive heart disease with heart failure ([employer])   3. Chronic anticoagulation   4. Thoracic aortic aneurysm without rupture ([place])   5. CKD (chronic kidney disease) stage 3, GFR 30-59 ml/min (HCC)    PLAN:    In order of problems listed above:  1. Stable compensators stressed the need for sodium restriction I did gradually decreased his dose of torsemide from 20 mg twice daily to 20 mg daily Monday Wednesday and Friday.  Hopefully this will result in improvement in renal function I did not change the dose or discontinue his distal diuretic with pending nephrology evaluation.  Previously his GFR was in the range of 33 cc/min borderline stage III stage IV 2. Stable blood pressure target continue current treatment including beta-blocker calcium channel blocker ACE 3. Stable continue his anticoagulant he has had no bleeding complication 4. Stable we will reimage his aorta in 1 year 5. Worsened he has been referred to nephrology through the [facility]   Next appointment: 6 months   Medication Adjustments/Labs and Tests Ordered: Current medicines are reviewed at length with the patient today.  Concerns regarding medicines are outlined above.  No orders of the defined types were placed in this encounter.  No orders of the defined types were placed in this encounter.   No chief complaint on file.   History of Present Illness:    Kevin Lang is a 72 y.o. male with a hx of COPD mild CAD EF 65%, chronic CHF, chronic Atrial Fibrillationwith anticoagulation, sleep apnea on CPAP, HTN, CKD and 4.0 cm thoracic aortic root aneurysm last CT January 2017 and mild AR last seen 06/07/2017. He was told at [facility] he has stage 4 CKD and  awaitnig a nephrology evaluation  ASSESSMENT:    06/07/2017   1. Chronic diastolic heart failure ([place])   2. Permanent atrial fibrillation ([place])   3. Thoracic aortic aneurysm without rupture ([place])   4. Hypertensive heart disease with heart failure ([place])   5. Chronic anticoagulation    PLAN:    1. Stable compensated continue his current sodium restriction diuretic and I strongly encouraged him to weigh daily to assist and self-management 2. Stable rate controlled continue beta-blocker and anticoagulant 3. He requires a follow-up CT without contrast for progression scheduled today if greater than 55 mm would need to be considered for vascular intervention 4. Stable no evidence of volume overload continue his current diuretic blood pressure is stable continue treatment including beta-blocker ACE inhibitor and MRA 5. Stable continue his current anticoagulant he requires no adjustment for renal function and he is having no bleeding complication    Compliance with diet, lifestyle and medications: No he predominately is eating outside of his home Patient his wife tell me that his CKD is progressed now stage IV and is awaiting nephrology evaluation through the [facility] hospital.  Despite eating his meals predominately outside of the home his weight is stable he has no edema he fatigues easily but is not short of breath no chest pain palpitation or syncope.  Recent CTA showed minimal progression and does not require surgical intervention.  Unfortunately did bring a copy of his labs from the [place]  hospital. Past Medical History:  Diagnosis Date  . Aneurysm of thoracic aorta ([place]) 02/16/2017   Overview:  Cardiac cath 12/08/15: Conclusions Diagnostic Procedure Summary 1. Mild non-obstructive CAD 2. Normal LV systolic function 3. Aortogram performed to eval aortic root aneurysm, 4.0 cm by this study.  CT Jan 2017, ascending aaorta aneurysm, 4.8 cm IMOUPDATE  . Benign essential hypertension 02/16/2017  .  Cardiomyopathy ([place]) 08/23/2015  . Chronic anticoagulation 05/21/2016  . Chronic combined systolic and diastolic congestive heart failure ([place]) 02/16/2017  . CKD (chronic kidney disease) stage 3, GFR 30-59 ml/min (HCC) 12/31/2015  . COPD (chronic obstructive pulmonary disease) ([place]) 05/21/2016  . Mild CAD 05/21/2016   Overview:  Cardiac cath 12/08/15:Conclusions Diagnostic Procedure Summary 1. Mild non-obstructive CAD 2. Normal LV systolic function 3. Aortogram performed to eval aortic root aneurysm, 4.0 cm by this study. Diagnostic Procedure Recommendations  . Persistent atrial fibrillation ([place]) 02/16/2017    Past Surgical History:  Procedure Laterality Date  . CORONARY ANGIOPLASTY      Current Medications: No outpatient medications have been marked as taking for the 12/13/17 encounter (Appointment) with Richardo Priest, MD.     Allergies:   Patient has no known allergies.   Social History   Socioeconomic History  . Marital status: Married    Spouse name: Not on file  . Number of children: Not on file  . Years of education: Not on file  . Highest education level: Not on file  Occupational History  . Not on file  Social Needs  . Financial resource strain: Not on file  . Food insecurity:    Worry: Not on file    Inability: Not on file  . Transportation needs:    Medical: Not on file    Non-medical: Not on file  Tobacco Use  . Smoking status: Former Smoker    Packs/day: 0.50    Years: 30.00    Pack years: 15.00    Types: Cigarettes    Last attempt to quit: 08/22/2000    Years since quitting: 17.3  . Smokeless tobacco: Never Used  Substance and Sexual Activity  . Alcohol use: No  . Drug use: No  . Sexual activity: Not on file  Lifestyle  . Physical activity:    Days per week: Not on file    Minutes per session: Not on file  . Stress: Not on file  Relationships  . Social connections:    Talks on phone: Not on file    Gets together: Not on file    Attends religious  service: Not on file    Active member of club or organization: Not on file    Attends meetings of clubs or organizations: Not on file    Relationship status: Not on file  Other Topics Concern  . Not on file  Social History Narrative  . Not on file     Family History: The patient's family history includes Aneurysm in his father; Heart disease in his brother; Hypertension in his mother; Stroke in his mother. ROS:   Please see the history of present illness.    All other systems reviewed and are negative.  EKGs/Labs/Other Studies Reviewed:    The following studies were reviewed today  CHEST CT 06/07/17: IMPRESSION: 1. Ascending thoracic aorta has a maximum diameter 4.2 cm. The anterior arch measures 4.2 cm. Recommend annual imaging followup by CTA or MRA. This recommendation follows 2010 ACCF/AHA/AATS/ACR/ASA/SCA/SCAI/SIR/STS/SVM Guidelines for the Diagnosis and Management of Patients with Thoracic Aortic Disease.  Circulation. 2010; 121: Z610-R604 2. Aortic Atherosclerosis (ICD10-I70.0) and Emphysema (ICD10-J43.9). Calcifications in the LAD and RCA coronary artery noted. 3. Small nonspecific nodules in the right upper lobe measure up to 4 mm. Non-contrast chest CT can be considered in 12 months if patient is high-risk. This recommendation follows the consensus statement:  Recent Labs: 03/05/2017: BNP 112.0 06/07/2017: BUN 34; Creatinine, Ser 1.67; NT-Pro BNP 992; Potassium 4.6; Sodium 143  Recent Lipid Panel No results found for: CHOL, TRIG, HDL, CHOLHDL, VLDL, LDLCALC, LDLDIRECT  Physical Exam:    VS:  There were no vitals taken for this visit.    Wt Readings from Last 3 Encounters:  06/07/17 232 lb (105.2 kg)  03/05/17 229 lb (103.9 kg)     GEN:  Well nourished, well developed in no acute distress HEENT: Normal NECK: No JVD; No carotid bruits LYMPHATICS: No lymphadenopathy CARDIAC: RRR, no murmurs, rubs, gallops RESPIRATORY:  Clear to auscultation without rales,  wheezing or rhonchi  ABDOMEN: Soft, non-tender, non-distended MUSCULOSKELETAL:  No edema; No deformity  SKIN: Warm and dry NEUROLOGIC:  Alert and oriented x 3 PSYCHIATRIC:  Normal affect    Signed, Shirlee More, MD  12/13/2017 7:58 AM    [place]

## 2017-12-13 ENCOUNTER — Ambulatory Visit (INDEPENDENT_AMBULATORY_CARE_PROVIDER_SITE_OTHER): Payer: Medicare Other | Admitting: Cardiology

## 2017-12-13 ENCOUNTER — Encounter: Payer: Self-pay | Admitting: Cardiology

## 2017-12-13 VITALS — BP 100/74 | HR 88 | Ht 64.0 in | Wt 226.8 lb

## 2017-12-13 DIAGNOSIS — I5032 Chronic diastolic (congestive) heart failure: Secondary | ICD-10-CM

## 2017-12-13 DIAGNOSIS — I4821 Permanent atrial fibrillation: Secondary | ICD-10-CM

## 2017-12-13 DIAGNOSIS — I110 Hypertensive heart disease with heart failure: Secondary | ICD-10-CM

## 2017-12-13 DIAGNOSIS — I712 Thoracic aortic aneurysm, without rupture, unspecified: Secondary | ICD-10-CM

## 2017-12-13 DIAGNOSIS — N183 Chronic kidney disease, stage 3 unspecified: Secondary | ICD-10-CM

## 2017-12-13 DIAGNOSIS — I482 Chronic atrial fibrillation: Secondary | ICD-10-CM

## 2017-12-13 DIAGNOSIS — Z7901 Long term (current) use of anticoagulants: Secondary | ICD-10-CM

## 2017-12-13 MED ORDER — TORSEMIDE 20 MG PO TABS: ORAL_TABLET | ORAL | 3 refills | Status: DC

## 2017-12-13 NOTE — Patient Instructions (Signed)
Medication Instructions:  Your physician has recommended you make the following change in your medication:  CHANGE torsemide. Take 20 mg once daily Monday, Wednesday, Friday. All other days take twice daily.  Labwork: None  Testing/Procedures: You had an EKG today.  Follow-Up: Your physician wants you to follow-up in: 6 months. You will receive a reminder letter in the mail two months in advance. If you don't receive a letter, please call our office to schedule the follow-up appointment.  Any Other Special Instructions Will Be Listed Below (If Applicable).     If you need a refill on your cardiac medications before your next appointment, please call your pharmacy.

## 2018-01-03 DIAGNOSIS — H2513 Age-related nuclear cataract, bilateral: Secondary | ICD-10-CM | POA: Diagnosis not present

## 2018-01-03 DIAGNOSIS — H524 Presbyopia: Secondary | ICD-10-CM | POA: Diagnosis not present

## 2018-01-10 ENCOUNTER — Inpatient Hospital Stay: Payer: Medicare HMO | Attending: Oncology

## 2018-01-10 DIAGNOSIS — M545 Low back pain: Secondary | ICD-10-CM | POA: Diagnosis not present

## 2018-01-10 DIAGNOSIS — D649 Anemia, unspecified: Secondary | ICD-10-CM | POA: Insufficient documentation

## 2018-01-10 DIAGNOSIS — Z79899 Other long term (current) drug therapy: Secondary | ICD-10-CM | POA: Insufficient documentation

## 2018-01-10 DIAGNOSIS — C9001 Multiple myeloma in remission: Secondary | ICD-10-CM | POA: Insufficient documentation

## 2018-01-10 DIAGNOSIS — G8929 Other chronic pain: Secondary | ICD-10-CM | POA: Diagnosis not present

## 2018-01-10 LAB — CMP (CANCER CENTER ONLY)
ALT: 22 U/L (ref 0–55)
AST: 23 U/L (ref 5–34)
Albumin: 3.9 g/dL (ref 3.5–5.0)
Alkaline Phosphatase: 74 U/L (ref 40–150)
Anion gap: 9 (ref 3–11)
BUN: 10 mg/dL (ref 7–26)
CO2: 25 mmol/L (ref 22–29)
Calcium: 9.5 mg/dL (ref 8.4–10.4)
Chloride: 104 mmol/L (ref 98–109)
Creatinine: 1.2 mg/dL (ref 0.70–1.30)
GFR, Est AFR Am: >60 mL/min (ref >60)
GFR, Estimated: 58 mL/min — ABNORMAL LOW (ref >60)
Glucose, Bld: 178 mg/dL — ABNORMAL HIGH (ref 70–140)
Potassium: 4.2 mmol/L (ref 3.5–5.1)
Sodium: 138 mmol/L (ref 136–145)
Total Bilirubin: 0.2 mg/dL (ref 0.2–1.2)
Total Protein: 8.3 g/dL (ref 6.4–8.3)

## 2018-01-10 LAB — CBC WITH DIFFERENTIAL (CANCER CENTER ONLY)
Basophils Absolute: 0 10*3/uL (ref 0.0–0.1)
Basophils Relative: 0 %
Eosinophils Absolute: 0.1 10*3/uL (ref 0.0–0.5)
Eosinophils Relative: 1 %
HCT: 35.1 % — ABNORMAL LOW (ref 38.4–49.9)
Hemoglobin: 11.6 g/dL — ABNORMAL LOW (ref 13.0–17.1)
Lymphocytes Relative: 29 %
Lymphs Abs: 1.6 10*3/uL (ref 0.9–3.3)
MCH: 29.7 pg (ref 27.2–33.4)
MCHC: 33 g/dL (ref 32.0–36.0)
MCV: 90 fL (ref 79.3–98.0)
Monocytes Absolute: 0.4 10*3/uL (ref 0.1–0.9)
Monocytes Relative: 6 %
Neutro Abs: 3.5 10*3/uL (ref 1.5–6.5)
Neutrophils Relative %: 64 %
Platelet Count: 191 10*3/uL (ref 140–400)
RBC: 3.9 MIL/uL — ABNORMAL LOW (ref 4.20–5.82)
RDW: 15.5 % — ABNORMAL HIGH (ref 11.0–14.6)
WBC Count: 5.5 10*3/uL (ref 4.0–10.3)

## 2018-01-11 LAB — KAPPA/LAMBDA LIGHT CHAINS
Kappa free light chain: 18.4 mg/L (ref 3.3–19.4)
Kappa, lambda light chain ratio: 1.63 (ref 0.26–1.65)
Lambda free light chains: 11.3 mg/L (ref 5.7–26.3)

## 2018-01-13 LAB — MULTIPLE MYELOMA PANEL, SERUM
Albumin SerPl Elph-Mcnc: 3.8 g/dL (ref 2.9–4.4)
Albumin/Glob SerPl: 1 (ref 0.7–1.7)
Alpha 1: 0.2 g/dL (ref 0.0–0.4)
Alpha2 Glob SerPl Elph-Mcnc: 0.7 g/dL (ref 0.4–1.0)
B-Globulin SerPl Elph-Mcnc: 2.5 g/dL — ABNORMAL HIGH (ref 0.7–1.3)
Gamma Glob SerPl Elph-Mcnc: 0.6 g/dL (ref 0.4–1.8)
Globulin, Total: 4 g/dL — ABNORMAL HIGH (ref 2.2–3.9)
IgA: 2088 mg/dL — ABNORMAL HIGH (ref 61–437)
IgG (Immunoglobin G), Serum: 659 mg/dL — ABNORMAL LOW (ref 700–1600)
IgM (Immunoglobulin M), Srm: 12 mg/dL — ABNORMAL LOW (ref 15–143)
M Protein SerPl Elph-Mcnc: 1.5 g/dL — ABNORMAL HIGH
Total Protein ELP: 7.8 g/dL (ref 6.0–8.5)

## 2018-01-17 ENCOUNTER — Telehealth: Payer: Self-pay | Admitting: Oncology

## 2018-01-17 ENCOUNTER — Inpatient Hospital Stay (HOSPITAL_BASED_OUTPATIENT_CLINIC_OR_DEPARTMENT_OTHER): Payer: Medicare HMO | Admitting: Oncology

## 2018-01-17 VITALS — BP 143/64 | HR 83 | Temp 98.4°F | Resp 18 | Ht 72.0 in | Wt 208.3 lb

## 2018-01-17 DIAGNOSIS — D649 Anemia, unspecified: Secondary | ICD-10-CM

## 2018-01-17 DIAGNOSIS — C9001 Multiple myeloma in remission: Secondary | ICD-10-CM

## 2018-01-17 DIAGNOSIS — Z79899 Other long term (current) drug therapy: Secondary | ICD-10-CM

## 2018-01-17 DIAGNOSIS — G8929 Other chronic pain: Secondary | ICD-10-CM

## 2018-01-17 DIAGNOSIS — M545 Low back pain: Secondary | ICD-10-CM | POA: Diagnosis not present

## 2018-01-17 NOTE — Telephone Encounter (Signed)
Scheduled appt per 6/20 los - gave patient AVS and calender per los.  

## 2018-01-17 NOTE — Progress Notes (Signed)
Hematology and Oncology Follow Up Visit  James [name] [id number] August 02, 1945 72 y.o. 01/17/2018 3:48 PM [name], [name], MDEhinger, [name], MD   Principle Diagnosis: 72 year old man with IgA multiple myeloma diagnosed in June 2017.  He was found to have M spike of 3 g/dL and an IgA level of 3700.    Prior therapy:  Revlimid and dexamethasone started on January 28 2016. Revlimid at 25 mg daily for 21 days with dexamethasone at 20 mg weekly. Therapy discontinued in January 2018 after achieving a complete response.  Current therapy: Active surveillance.   Interim History:  Mr. Bollard is here for a follow-up visit.  Since the last visit, he reports no major changes in his health.  He continues to have urinary issues including hematuria, nocturia and urgency.  He continues to follow with Dr. Tresa Moore regarding this issue without the need for urinary catheter.  No prostate cancer diagnosis has been made.  He still ambulates without any difficulties and has not reported any falls or syncope.  Continues to have chronic pain which is unchanged and related to arthritis.  He does report fatigue and tiredness at times which is chronic in nature as well.  He does not report any headaches, blurry vision, syncope or seizures.  He denies any alteration in mental status or confusion.  He does not report any fevers or chills or sweats.  His weight and appetite is unchanged.  He denies any easy bruising or bleeding.. Does not report any cough or hemoptysis or hematemesis. He does not report any chest pain palpitation or orthopnea. He reports no nausea or vomiting or abdominal pain.  He denies any constipation or diarrhea.  He does not report any skin rashes or lesions.  He does not report any lymphadenopathy or petechiae. The remaining review of systems is negative.    Medications: I have reviewed the patient's current medications.  Current Outpatient Medications  Medication Sig Dispense Refill  . amLODipine  (NORVASC) 10 MG tablet Take 10 mg by mouth every morning.     . famotidine (PEPCID) 20 MG tablet Take 1 tablet (20 mg total) by mouth 2 (two) times daily. 10 tablet 0  . ferrous sulfate 325 (65 FE) MG tablet Take 325 mg by mouth 2 (two) times daily with a meal.     . finasteride (PROSCAR) 5 MG tablet Take 5 mg by mouth daily.    Marland Kitchen gabapentin (NEURONTIN) 400 MG capsule Take 400 mg by mouth 3 (three) times daily.     Marland Kitchen glimepiride (AMARYL) 2 MG tablet Take 2 mg by mouth daily.    Marland Kitchen HYDROcodone-acetaminophen (NORCO) 10-325 MG tablet Take 1 tablet by mouth.    . losartan (COZAAR) 50 MG tablet Take 50 mg by mouth every morning.     . metFORMIN (GLUCOPHAGE-XR) 500 MG 24 hr tablet Take 1,000 mg by mouth 2 (two) times daily.     Marland Kitchen omeprazole (PRILOSEC) 20 MG capsule Take 20 mg by mouth 4 (four) times daily.     . Tamsulosin HCl (FLOMAX) 0.4 MG CAPS Take 0.4 mg by mouth 2 (two) times daily.     . traMADol (ULTRAM) 50 MG tablet Take 50 mg by mouth 4 (four) times daily -  with meals and at bedtime. For pain     No current facility-administered medications for this visit.     Allergies:  Allergies  Allergen Reactions  . Invokana [Canagliflozin] Anaphylaxis    Facial and lip swelling    Past  Medical History, Surgical history, Social history, and Family History were reviewed and updated.   Physical Exam: Blood pressure (!) 143/64, pulse 83, temperature 98.4 F (36.9 C), temperature source Oral, resp. rate 18, height 6' (1.829 m), weight 208 lb 4.8 oz (94.5 kg), SpO2 98 %.   ECOG: 1 General appearance: Comfortable appearing gentleman without distress. Head: Atraumatic without abnormalities. Oropharynx: No ulcers or lesions.  Mucous membranes are moist. Eyes: Pupils are equal and round and reactive to light.  Extraocular muscle intact. Lymph nodes: No lymphadenopathy noted in the cervical, supraclavicular, or axillary nodes  Heart:regular rate and rhythm without any murmurs or gallops.  No leg  edema.   Lung: Clear without any rhonchi, wheezes or dullness to percussion. Abdomin: Soft, without any rebound or guarding.  No shifting dullness or ascites. Musculoskeletal: No clubbing or cyanosis. Neurological: No motor or sensory deficits..  Lab Results: Lab Results  Component Value Date   WBC 5.5 01/10/2018   HGB 11.6 (L) 01/10/2018   HCT 35.1 (L) 01/10/2018   MCV 90.0 01/10/2018   PLT 191 01/10/2018     Chemistry      Component Value Date/Time   NA 138 01/10/2018 1512   NA 138 06/26/2017 0946   K 4.2 01/10/2018 1512   K 3.8 06/26/2017 0946   CL 104 01/10/2018 1512   CL 105 08/09/2012 0918   CO2 25 01/10/2018 1512   CO2 25 06/26/2017 0946   BUN 10 01/10/2018 1512   BUN 16.4 06/26/2017 0946   CREATININE 1.20 01/10/2018 1512   CREATININE 1.2 06/26/2017 0946      Component Value Date/Time   CALCIUM 9.5 01/10/2018 1512   CALCIUM 9.3 06/26/2017 0946   ALKPHOS 74 01/10/2018 1512   ALKPHOS 75 06/26/2017 0946   AST 23 01/10/2018 1512   AST 11 06/26/2017 0946   ALT 22 01/10/2018 1512   ALT 14 06/26/2017 0946   BILITOT 0.2 01/10/2018 1512   BILITOT 0.31 06/26/2017 0946      Results for Saal, HOMERO HYSON (MRN [id number]) as of 01/17/2018 15:48  Ref. Range 09/27/2017 14:18 01/10/2018 15:12  IgA Latest Ref Range: 61 - 437 mg/dL 1,863 (H) 2,088 (H)     Results for DOMNIC, VANTOL (MRN [id number]) as of 01/17/2018 15:48  Ref. Range 09/27/2017 14:18 01/10/2018 15:12  M Protein SerPl Elph-Mcnc Latest Ref Range: Not Observed g/dL 1.4 (H) 1.5 (H)      Impression and Plan:  72 year old with:  1. IgA Kappa Multiple myeloma presented with worsening anemia and bone marrow involvement in 2017.  He achieved a complete response to Revlimid and dexamethasone with therapy concluded in January 2018.  Protein studies obtained on 01/10/2018 was personally reviewed today and continues to show mild increase in his protein studies and mild anemia.  These findings suggest early  signs of progression of disease and I have discussed the rationale for restarting anti-myeloma therapy.  It is unclear if he is symptomatic from this disease but he is developing worsening anemia.  After discussing the risks and benefits today, he wants to continue to defer anticancer treatment for his long as possible.  I see no urgent indication for treatment at this time but I recommended continue close active surveillance with repeat skeletal survey before the next visit.  We have reviewed potential treatment options which include restarting Revlimid and dexamethasone versus Velcade-based therapies.  2.  Back pain and hip pain: Chronic in nature and unrelated to multiple myeloma.  Pain  remains manageable.  3.  Anemia: Could be related to chronic blood loss from his genitourinary tract or plasma cell disorder.  He is asymptomatic from it at this time and will continue to monitor.  4.  Follow-up: In in 3 months for repeat laboratory testing and exam.  15  minutes was spent with the patient face-to-face today.  More than 50% of time was dedicated to patient counseling, education and discussing the natural course of this disease and treatment options.    Zola Button, MD 6/20/20193:48 PM

## 2018-01-30 DIAGNOSIS — M48062 Spinal stenosis, lumbar region with neurogenic claudication: Secondary | ICD-10-CM | POA: Diagnosis not present

## 2018-01-30 DIAGNOSIS — Z6831 Body mass index (BMI) 31.0-31.9, adult: Secondary | ICD-10-CM | POA: Diagnosis not present

## 2018-01-30 DIAGNOSIS — I10 Essential (primary) hypertension: Secondary | ICD-10-CM | POA: Diagnosis not present

## 2018-02-04 ENCOUNTER — Telehealth: Payer: Self-pay | Admitting: Cardiology

## 2018-02-04 NOTE — Telephone Encounter (Signed)
Left voicemail for the patient to call the office. 

## 2018-02-04 NOTE — Telephone Encounter (Signed)
Pt c/o Shortness Of Breath: STAT if SOB developed within the last 24 hours or pt is noticeably SOB on the phone  1. Are you currently SOB (can you hear that pt is SOB on the phone)? Patient daughter called   2. How long have you been experiencing SOB? *over weekend  3. Are you SOB when sitting or when up moving around? At all times  4. Are you currently experiencing any other symptoms? Some chest tightmess  Patient wants to be seen today per Daughter.

## 2018-02-05 ENCOUNTER — Encounter: Payer: Self-pay | Admitting: Cardiology

## 2018-02-05 ENCOUNTER — Ambulatory Visit (INDEPENDENT_AMBULATORY_CARE_PROVIDER_SITE_OTHER): Payer: Medicare Other | Admitting: Cardiology

## 2018-02-05 VITALS — BP 132/86 | HR 106 | Ht 64.0 in | Wt 234.8 lb

## 2018-02-05 DIAGNOSIS — I5032 Chronic diastolic (congestive) heart failure: Secondary | ICD-10-CM | POA: Diagnosis not present

## 2018-02-05 DIAGNOSIS — I5033 Acute on chronic diastolic (congestive) heart failure: Secondary | ICD-10-CM | POA: Diagnosis not present

## 2018-02-05 DIAGNOSIS — Z7901 Long term (current) use of anticoagulants: Secondary | ICD-10-CM

## 2018-02-05 DIAGNOSIS — G4733 Obstructive sleep apnea (adult) (pediatric): Secondary | ICD-10-CM | POA: Diagnosis present

## 2018-02-05 DIAGNOSIS — Z9989 Dependence on other enabling machines and devices: Secondary | ICD-10-CM | POA: Diagnosis not present

## 2018-02-05 DIAGNOSIS — Z8673 Personal history of transient ischemic attack (TIA), and cerebral infarction without residual deficits: Secondary | ICD-10-CM | POA: Diagnosis not present

## 2018-02-05 DIAGNOSIS — I4821 Permanent atrial fibrillation: Secondary | ICD-10-CM

## 2018-02-05 DIAGNOSIS — I482 Chronic atrial fibrillation: Secondary | ICD-10-CM | POA: Diagnosis not present

## 2018-02-05 DIAGNOSIS — I712 Thoracic aortic aneurysm, without rupture: Secondary | ICD-10-CM | POA: Diagnosis present

## 2018-02-05 DIAGNOSIS — Z79899 Other long term (current) drug therapy: Secondary | ICD-10-CM | POA: Diagnosis not present

## 2018-02-05 DIAGNOSIS — I2510 Atherosclerotic heart disease of native coronary artery without angina pectoris: Secondary | ICD-10-CM

## 2018-02-05 DIAGNOSIS — I4891 Unspecified atrial fibrillation: Secondary | ICD-10-CM | POA: Diagnosis not present

## 2018-02-05 DIAGNOSIS — R0602 Shortness of breath: Secondary | ICD-10-CM | POA: Diagnosis not present

## 2018-02-05 DIAGNOSIS — E785 Hyperlipidemia, unspecified: Secondary | ICD-10-CM | POA: Diagnosis present

## 2018-02-05 DIAGNOSIS — Z9119 Patient's noncompliance with other medical treatment and regimen: Secondary | ICD-10-CM | POA: Diagnosis not present

## 2018-02-05 DIAGNOSIS — I130 Hypertensive heart and chronic kidney disease with heart failure and stage 1 through stage 4 chronic kidney disease, or unspecified chronic kidney disease: Secondary | ICD-10-CM

## 2018-02-05 DIAGNOSIS — I509 Heart failure, unspecified: Secondary | ICD-10-CM | POA: Diagnosis not present

## 2018-02-05 DIAGNOSIS — N184 Chronic kidney disease, stage 4 (severe): Secondary | ICD-10-CM | POA: Diagnosis present

## 2018-02-05 DIAGNOSIS — I5023 Acute on chronic systolic (congestive) heart failure: Secondary | ICD-10-CM | POA: Diagnosis present

## 2018-02-05 DIAGNOSIS — I10 Essential (primary) hypertension: Secondary | ICD-10-CM | POA: Diagnosis not present

## 2018-02-05 DIAGNOSIS — I481 Persistent atrial fibrillation: Secondary | ICD-10-CM | POA: Diagnosis present

## 2018-02-05 NOTE — Telephone Encounter (Signed)
Please schedule this patient for this afternoon per [facility].

## 2018-02-05 NOTE — Telephone Encounter (Signed)
Daughter states that he father is short of breath at all times especially when he walks. Cannot account for any weight gain as the patient will not weigh his self every day. Daughter claims patient appears to be swollen at all times. Would you be interested in bringing this patient in sooner for an office visit?    [phone] Felling

## 2018-02-05 NOTE — Progress Notes (Signed)
Cardiology Office Note:    Date:  02/05/2018   ID:  Kevin Lang, DOB 24-Apr-1946, MRN [id number]  PCP:  Cathlyn Parsons, PA-C  Cardiologist:  Shirlee More, MD    Referring MD: Cathlyn Parsons, PA-C    ASSESSMENT:    1. Chronic diastolic heart failure ([place])   2. Heart & renal disease, hypertensive, with heart fail/chron kidney dis ([place])   3. Permanent atrial fibrillation ([place])   4. Chronic anticoagulation   5. Mild CAD    PLAN:    In order of problems listed above:  1. His heart failure is decompensated he is short of breath at rest he has marked fluid overload and with stage IV CKD requires admit to the hospital for diuresis.  I will call the hospitalist to [facility] as he prefers to remain in town. 2. See above 3. Rate controlled continue his beta-blocker and anticoagulant 4. Stable continue his current anticoagulant 5. Although his EKG shows no ischemic changes he is having intermittent exertional and rest chest tightness the risk of recurrent ischemia require troponin assessment and cycling and a decision about further evaluation for ischemia depending on whether or not he has evidence of acute coronary syndrome   Next appointment: after hospital stay   Medication Adjustments/Labs and Tests Ordered: Current medicines are reviewed at length with the patient today.  Concerns regarding medicines are outlined above.  No orders of the defined types were placed in this encounter.  No orders of the defined types were placed in this encounter.   Chief Complaint  Patient presents with  . Follow-up  . Shortness of Breath  . Chest Pain    History of Present Illness:    Kevin Lang is a 72 y.o. 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 Compliance with diet, lifestyle despite the fact that  he is taking his diuretic restricting sodium he no longer weighs at home and medications:   He has not felt well since the weekend.  His diuretics were decreased by nephrologist in the hospital ACE inhibitor Spironolactone were discontinued.  This weekend he developed severe exertional shortness of breath is developed edema 2-3 pillow orthopnea and he has had chest discomfort off and on with minor activities.  He has not taken nitroglycerin.  Presents to my office today in decompensated heart failure short of breath at rest and requires admission to the hospital stage IV CKD for diuresis assessment of troponins and a decision about noninvasive or invasive evaluation of coronary artery disease.  He has had cough and wheezing but no fever chills or sputum production no palpitation or syncope he has chronic atrial fibrillation remains in that heart rhythm today rate controlled Past Medical History:  Diagnosis Date  . Aneurysm of thoracic aorta ([place]) 02/16/2017   Overview:  Cardiac cath 12/08/15: Conclusions Diagnostic Procedure Summary 1. Mild non-obstructive CAD 2. Normal LV systolic function 3. Aortogram performed to eval aortic root aneurysm, 4.0 cm by this study.  CT Jan 2017, ascending aaorta aneurysm, 4.8 cm IMOUPDATE  . Benign essential hypertension 02/16/2017  . Cardiomyopathy ([place]) 08/23/2015  . Chronic anticoagulation 05/21/2016  . Chronic combined systolic and diastolic congestive heart failure ([place]) 02/16/2017  . CKD (chronic kidney disease) stage 3, GFR 30-59 ml/min (HCC) 12/31/2015  . COPD (  chronic obstructive pulmonary disease) ([place]) 05/21/2016  . Mild CAD 05/21/2016   Overview:  Cardiac cath 12/08/15:Conclusions Diagnostic Procedure Summary 1. Mild non-obstructive CAD 2. Normal LV systolic function 3. Aortogram performed to eval aortic root aneurysm, 4.0 cm by this study. Diagnostic Procedure Recommendations  . Persistent atrial fibrillation ([place]) 02/16/2017    Past Surgical History:    Procedure Laterality Date  . CORONARY ANGIOPLASTY      Current Medications: Current Meds  Medication Sig  . apixaban (ELIQUIS) 5 MG TABS tablet Take 5 mg by mouth 2 (two) times daily.  Marland Kitchen atorvastatin (LIPITOR) 40 MG tablet Take 40 mg by mouth daily.  . cromolyn (NASALCROM) 5.2 MG/ACT nasal spray Place into the nose.  . diltiazem (CARDIZEM) 120 MG tablet Take 120 mg by mouth daily.  . metoprolol tartrate (LOPRESSOR) 100 MG tablet Take 100 mg by mouth 2 (two) times daily.   . nitroGLYCERIN (NITROSTAT) 0.4 MG SL tablet Place 0.4 mg under the tongue every 5 (five) hours as needed.  . torsemide (DEMADEX) 20 MG tablet Take 20 mg once daily Monday, Wednesday, Friday. All other days take twice daily.     Allergies:   Patient has no known allergies.   Social History   Socioeconomic History  . Marital status: Married    Spouse name: Not on file  . Number of children: Not on file  . Years of education: Not on file  . Highest education level: Not on file  Occupational History  . Not on file  Social Needs  . Financial resource strain: Not on file  . Food insecurity:    Worry: Not on file    Inability: Not on file  . Transportation needs:    Medical: Not on file    Non-medical: Not on file  Tobacco Use  . Smoking status: Former Smoker    Packs/day: 0.50    Years: 30.00    Pack years: 15.00    Types: Cigarettes    Last attempt to quit: 08/22/2000    Years since quitting: 17.4  . Smokeless tobacco: Never Used  Substance and Sexual Activity  . Alcohol use: No  . Drug use: No  . Sexual activity: Not on file  Lifestyle  . Physical activity:    Days per week: Not on file    Minutes per session: Not on file  . Stress: Not on file  Relationships  . Social connections:    Talks on phone: Not on file    Gets together: Not on file    Attends religious service: Not on file    Active member of club or organization: Not on file    Attends meetings of clubs or organizations: Not on  file    Relationship status: Not on file  Other Topics Concern  . Not on file  Social History Narrative  . Not on file     Family History: The patient's family history includes Aneurysm in his father; Heart disease in his brother; Hypertension in his mother; Stroke in his mother. ROS:   Please see the history of present illness.    All other systems reviewed and are negative.  EKGs/Labs/Other Studies Reviewed:    The following studies were reviewed today:  EKG:  EKG ordered today.  The ekg ordered today demonstrates rate controlled atrial fibrillation no ischemic changes  Recent Labs: 03/05/2017: BNP 112.0 06/07/2017: BUN 34; Creatinine, Ser 1.67; NT-Pro BNP 992; Potassium 4.6; Sodium 143  Recent Lipid Panel No results found for:  CHOL, TRIG, HDL, CHOLHDL, VLDL, LDLCALC, LDLDIRECT  Physical Exam:    VS:  BP 132/86 (BP Location: Right Arm, Patient Position: Sitting, Cuff Size: Large)   Pulse (!) 106   Ht _0  (1.626 m)   Wt 234 lb 12.8 oz (106.5 kg)   SpO2 97%   BMI 40.30 kg/m     Wt Readings from Last 3 Encounters:  02/05/18 234 lb 12.8 oz (106.5 kg)  12/13/17 226 lb 12.8 oz (102.9 kg)  06/07/17 232 lb (105.2 kg)     GEN: Overweight mildly short of breath at rest  HEENT: Normal NECK: No JVD; No carotid bruits LYMPHATICS: No lymphadenopathy CARDIAC: Distant irregular irregular no murmur RRR, no murmurs, rubs, gallops RESPIRATORY: Diffusely decreased breath sounds expiratory wheezing ABDOMEN: Soft, non-tender, non-distended MUSCULOSKELETAL: Greater than 4+ edema that extends into the thigh as well as some presacral edema; No deformity  SKIN: Warm and dry NEUROLOGIC:  Alert and oriented x 3 PSYCHIATRIC:  Normal affect    Signed, Shirlee More, MD  02/05/2018 2:54 PM    [facility]

## 2018-02-05 NOTE — Telephone Encounter (Signed)
Yes this afternoon

## 2018-02-06 ENCOUNTER — Encounter: Payer: Self-pay | Admitting: Internal Medicine

## 2018-02-06 DIAGNOSIS — R0602 Shortness of breath: Secondary | ICD-10-CM

## 2018-02-06 DIAGNOSIS — I509 Heart failure, unspecified: Secondary | ICD-10-CM

## 2018-02-06 DIAGNOSIS — I4891 Unspecified atrial fibrillation: Secondary | ICD-10-CM

## 2018-02-13 NOTE — Progress Notes (Signed)
Cardiology Office Note:    Date:  02/14/2018   ID:  Kevin Lang, DOB [birth year], MRN [id number]  PCP:  Cathlyn Parsons, PA-C  Cardiologist:  Shirlee More, MD    Referring MD: Cathlyn Parsons, PA-C    ASSESSMENT:    1. Cardiomyopathy, unspecified type ([place])   2. Chronic diastolic heart failure (HCC)   3. Heart & renal disease, hypertensive, with heart fail/chron kidney dis ([place])   4. Permanent atrial fibrillation ([place])   5. Chronic anticoagulation    PLAN:    In order of problems listed above:  1. He has had clinical deterioration in LV dysfunction is progressed from moderate to severe.  He is now on a higher dose of loop diuretic he will not weigh at home but he has no edema he is not having shortness of breath orthopnea but complains of profound exercise intolerance and weakness.  I will plan to recheck renal function proBNP and if his renal function is stable initiate Entresto 2426 twice daily given a sample.  If his ejection fraction fails to improve we will need to consider the merits of ICD therapy.  I pleaded with him to begin to weigh himself daily and be compliant with medications.  He will continue his current loop diuretic.  I will address the issue of beta-blocker at his next visit 2. See above 3. Recheck renal function today blood pressure stable 4. Rate controlled continue his current anticoagulant 5. Continue anticoagulant   Next appointment: 2 weeks   Medication Adjustments/Labs and Tests Ordered: Current medicines are reviewed at length with the patient today.  Concerns regarding medicines are outlined above.  No orders of the defined types were placed in this encounter.  No orders of the defined types were placed in this encounter.   Chief Complaint  Patient presents with  . Congestive Heart Failure    History of Present Illness:    Kevin Lang is a 72 y.o. male with a hx of COPD mild CAD heart failure with preserved ejection fraction chronic atrial  fibrillation with anticoagulation sleep apnea on CPAP as well as hypertension with stage III CKD mild aortic regurgitation and stable thoracic aortic aneurysm last seen 02/05/18 with decompensated heart failure and referred to [facility] admitted by the hospitalist program. Compliance with diet, lifestyle and medications: Yes  He does not weigh at home but he does sodium restrict takes his diuretics and is free of edema he is not short of breath does not have orthopnea but he has profound exercise intolerance no longer able to work.  He feels diffusely weak.  He is at risk for hyperkalemia has not had labs rechecked.  The plan today will be recheck his renal function proBNP and if stable initiate Entresto and address in 2 weeks switching from calcium channel blocker to beta-blocker with LV dysfunction and atrial fibrillation.  If he fails to improve we need to consider the merits of an ICD Past Medical History:  Diagnosis Date  . Aneurysm of thoracic aorta ([place]) 02/16/2017   Overview:  Cardiac cath 12/08/15: Conclusions Diagnostic Procedure Summary 1. Mild non-obstructive CAD 2. Normal LV systolic function 3. Aortogram performed to eval aortic root aneurysm, 4.0 cm by this study.  CT Jan 2017, ascending aaorta aneurysm, 4.8 cm IMOUPDATE  . Benign essential hypertension 02/16/2017  . Cardiomyopathy ([place]) 08/23/2015  . Chronic anticoagulation 05/21/2016  . Chronic combined systolic and diastolic congestive heart failure ([place]) 02/16/2017  . CKD (chronic kidney disease) stage 3,  GFR 30-59 ml/min ([place]) 12/31/2015  . COPD (chronic obstructive pulmonary disease) ([place]) 05/21/2016  . Mild CAD 05/21/2016   Overview:  Cardiac cath 12/08/15:Conclusions Diagnostic Procedure Summary 1. Mild non-obstructive CAD 2. Normal LV systolic function 3. Aortogram performed to eval aortic root aneurysm, 4.0 cm by this study. Diagnostic Procedure Recommendations  . Persistent atrial fibrillation ([place]) 02/16/2017    Past  Surgical History:  Procedure Laterality Date  . CORONARY ANGIOPLASTY      Current Medications: Current Meds  Medication Sig  . apixaban (ELIQUIS) 5 MG TABS tablet Take 5 mg by mouth 2 (two) times daily.  Marland Kitchen atorvastatin (LIPITOR) 40 MG tablet Take 40 mg by mouth daily.  . cromolyn (NASALCROM) 5.2 MG/ACT nasal spray Place into the nose.  . diltiazem (CARDIZEM) 120 MG tablet Take 120 mg by mouth daily.  . metoprolol tartrate (LOPRESSOR) 100 MG tablet Take 100 mg by mouth 2 (two) times daily.   . nitroGLYCERIN (NITROSTAT) 0.4 MG SL tablet Place 0.4 mg under the tongue every 5 (five) hours as needed.  . potassium chloride SA (K-DUR,KLOR-CON) 20 MEQ tablet Take 20 mEq by mouth daily.  Marland Kitchen spironolactone (ALDACTONE) 25 MG tablet Take 25 mg by mouth daily.  Marland Kitchen torsemide (DEMADEX) 20 MG tablet Take 20 mg once daily Monday, Wednesday, Friday. All other days take twice daily.     Allergies:   Patient has no known allergies.   Social History   Socioeconomic History  . Marital status: Married    Spouse name: Not on file  . Number of children: Not on file  . Years of education: Not on file  . Highest education level: Not on file  Occupational History  . Not on file  Social Needs  . Financial resource strain: Not on file  . Food insecurity:    Worry: Not on file    Inability: Not on file  . Transportation needs:    Medical: Not on file    Non-medical: Not on file  Tobacco Use  . Smoking status: Former Smoker    Packs/day: 0.50    Years: 30.00    Pack years: 15.00    Types: Cigarettes    Last attempt to quit: 08/22/2000    Years since quitting: 17.4  . Smokeless tobacco: Never Used  Substance and Sexual Activity  . Alcohol use: No  . Drug use: No  . Sexual activity: Not on file  Lifestyle  . Physical activity:    Days per week: Not on file    Minutes per session: Not on file  . Stress: Not on file  Relationships  . Social connections:    Talks on phone: Not on file    Gets  together: Not on file    Attends religious service: Not on file    Active member of club or organization: Not on file    Attends meetings of clubs or organizations: Not on file    Relationship status: Not on file  Other Topics Concern  . Not on file  Social History Narrative  . Not on file     Family History: The patient's family history includes Aneurysm in his father; Heart disease in his brother; Hypertension in his mother; Stroke in his mother. ROS:   Please see the history of present illness.    All other systems reviewed and are negative.  EKGs/Labs/Other Studies Reviewed:    The following studies were reviewed today:  EKG:  EKG ordered today.  The ekg ordered today  demonstrates atrial fibrillation rate controlled Echo ar RH: Ef has deteriorated now 20-25% Recent Labs: 03/05/2017: BNP 112.0 06/07/2017: BUN 34; Creatinine, Ser 1.67; NT-Pro BNP 992; Potassium 4.6; Sodium 143  Recent Lipid Panel No results found for: CHOL, TRIG, HDL, CHOLHDL, VLDL, LDLCALC, LDLDIRECT  Physical Exam:    VS:  BP 132/76 (BP Location: Right Arm, Patient Position: Sitting, Cuff Size: Normal)   Pulse (!) 54   Ht _0  (1.626 m)   Wt 226 lb (102.5 kg)   SpO2 97%   BMI 38.79 kg/m     Wt Readings from Last 3 Encounters:  02/14/18 226 lb (102.5 kg)  02/05/18 234 lb 12.8 oz (106.5 kg)  12/13/17 226 lb 12.8 oz (102.9 kg)     GEN:  Well nourished, well developed in no acute distress HEENT: Normal NECK: No JVD; No carotid bruits LYMPHATICS: No lymphadenopathy CARDIAC: Irr Irr variable s1 no s3 RESPIRATORY:  Clear to auscultation without rales, wheezing or rhonchi  ABDOMEN: Soft, non-tender, non-distended MUSCULOSKELETAL:  No edema; No deformity  SKIN: Warm and dry NEUROLOGIC:  Alert and oriented x 3 PSYCHIATRIC:  Normal affect    Signed, Shirlee More, MD  02/14/2018 4:14 PM    [facility]

## 2018-02-14 ENCOUNTER — Ambulatory Visit (INDEPENDENT_AMBULATORY_CARE_PROVIDER_SITE_OTHER): Payer: Medicare Other | Admitting: Cardiology

## 2018-02-14 ENCOUNTER — Encounter: Payer: Self-pay | Admitting: Cardiology

## 2018-02-14 VITALS — BP 132/76 | HR 54 | Ht 64.0 in | Wt 226.0 lb

## 2018-02-14 DIAGNOSIS — Z7901 Long term (current) use of anticoagulants: Secondary | ICD-10-CM | POA: Diagnosis not present

## 2018-02-14 DIAGNOSIS — I482 Chronic atrial fibrillation: Secondary | ICD-10-CM

## 2018-02-14 DIAGNOSIS — I429 Cardiomyopathy, unspecified: Secondary | ICD-10-CM | POA: Diagnosis not present

## 2018-02-14 DIAGNOSIS — I4821 Permanent atrial fibrillation: Secondary | ICD-10-CM

## 2018-02-14 DIAGNOSIS — I5032 Chronic diastolic (congestive) heart failure: Secondary | ICD-10-CM | POA: Diagnosis not present

## 2018-02-14 DIAGNOSIS — I130 Hypertensive heart and chronic kidney disease with heart failure and stage 1 through stage 4 chronic kidney disease, or unspecified chronic kidney disease: Secondary | ICD-10-CM

## 2018-02-14 NOTE — Patient Instructions (Signed)
Medication Instructions:  Your physician has recommended you make the following change in your medication:  START: Entresto 24/26 one tablet twice daily   DO NOT START: Until after you hear back from our office about your lab results.   Labwork: Your physician recommends that you have the following labs drawn: BNP and BMP   Testing/Procedures: NONE  Follow-Up: Your physician recommends that you schedule a follow-up appointment in: 2 weeks    Any Other Special Instructions Will Be Listed Below (If Applicable).     If you need a refill on your cardiac medications before your next appointment, please call your pharmacy.

## 2018-02-15 LAB — BASIC METABOLIC PANEL
BUN/Creatinine Ratio: 23 (ref 10–24)
BUN: 41 mg/dL — ABNORMAL HIGH (ref 8–27)
CALCIUM: 9.8 mg/dL (ref 8.6–10.2)
CHLORIDE: 105 mmol/L (ref 96–106)
CO2: 22 mmol/L (ref 20–29)
Creatinine, Ser: 1.81 mg/dL — ABNORMAL HIGH (ref 0.76–1.27)
GFR calc non Af Amer: 36 mL/min/{1.73_m2} — ABNORMAL LOW (ref >59)
GFR, EST AFRICAN AMERICAN: 42 mL/min/{1.73_m2} — AB (ref >59)
GLUCOSE: 97 mg/dL (ref 65–99)
POTASSIUM: 4.9 mmol/L (ref 3.5–5.2)
Sodium: 144 mmol/L (ref 134–144)

## 2018-02-15 LAB — PRO B NATRIURETIC PEPTIDE: NT-PRO BNP: 1261 pg/mL — AB (ref 0–376)

## 2018-02-19 ENCOUNTER — Telehealth: Payer: Self-pay | Admitting: Cardiology

## 2018-02-19 DIAGNOSIS — Z6831 Body mass index (BMI) 31.0-31.9, adult: Secondary | ICD-10-CM | POA: Diagnosis not present

## 2018-02-19 DIAGNOSIS — M48062 Spinal stenosis, lumbar region with neurogenic claudication: Secondary | ICD-10-CM | POA: Diagnosis not present

## 2018-02-19 DIAGNOSIS — I10 Essential (primary) hypertension: Secondary | ICD-10-CM | POA: Diagnosis not present

## 2018-02-19 NOTE — Telephone Encounter (Signed)
Has questions about a medication that was written in hospital

## 2018-02-21 NOTE — Telephone Encounter (Signed)
Left message for Kevin Lang at [facility] to return call

## 2018-02-28 ENCOUNTER — Encounter: Payer: Self-pay | Admitting: Cardiology

## 2018-02-28 ENCOUNTER — Ambulatory Visit (INDEPENDENT_AMBULATORY_CARE_PROVIDER_SITE_OTHER): Payer: Medicare Other | Admitting: Cardiology

## 2018-02-28 VITALS — BP 140/80 | HR 50 | Ht 64.0 in | Wt 225.0 lb

## 2018-02-28 DIAGNOSIS — I482 Chronic atrial fibrillation: Secondary | ICD-10-CM | POA: Diagnosis not present

## 2018-02-28 DIAGNOSIS — I5042 Chronic combined systolic (congestive) and diastolic (congestive) heart failure: Secondary | ICD-10-CM | POA: Diagnosis not present

## 2018-02-28 DIAGNOSIS — I4821 Permanent atrial fibrillation: Secondary | ICD-10-CM

## 2018-02-28 DIAGNOSIS — N183 Chronic kidney disease, stage 3 unspecified: Secondary | ICD-10-CM

## 2018-02-28 MED ORDER — SPIRONOLACTONE 25 MG PO TABS: 25.0000 mg | ORAL_TABLET | Freq: Every day | ORAL | 0 refills | Status: DC

## 2018-02-28 MED ORDER — SACUBITRIL-VALSARTAN 24-26 MG PO TABS: 1.0000 | ORAL_TABLET | Freq: Two times a day (BID) | ORAL | 2 refills | Status: DC

## 2018-02-28 MED ORDER — SACUBITRIL-VALSARTAN 24-26 MG PO TABS: 1.0000 | ORAL_TABLET | Freq: Two times a day (BID) | ORAL | Status: DC

## 2018-02-28 NOTE — Progress Notes (Signed)
Cardiology Office Note:    Date:  02/28/2018   ID:  Kevin Lang, DOB 10-Sep-1945, MRN [id number]  PCP:  Cathlyn Parsons, PA-C  Cardiologist:  Shirlee More, MD    Referring MD: Cathlyn Parsons, PA-C    ASSESSMENT:    1. Chronic combined systolic and diastolic heart failure ([place])   2. Permanent atrial fibrillation ([place])   3. CKD (chronic kidney disease) stage 3, GFR 30-59 ml/min (HCC)    PLAN:    In order of problems listed above:  1. Improved he has no volume overload renal function was stable he will start ARNI and his plans to be seen at the [facility] next week renal function proBNP check copy to me.  I will plan to see back in 1 month for up titration and discontinued his calcium channel blocker 2. Stable rate controlled continue beta-blocker anticoagulant 3. Stable recent labs recheck next weeks been seen by nephrology   Next appointment: One month   Medication Adjustments/Labs and Tests Ordered: Current medicines are reviewed at length with the patient today.  Concerns regarding medicines are outlined above.  No orders of the defined types were placed in this encounter.  Meds ordered this encounter  Medications  . DISCONTD: sacubitril-valsartan (ENTRESTO) 24-26 mg per tablet    Order Specific Question:   ACE-inhibitors have NOT been administered in the past 36-hours.    Answer:   NOT APPLICABLE (continuing home med)  . spironolactone (ALDACTONE) 25 MG tablet    Sig: Take 1 tablet (25 mg total) by mouth daily.    Dispense:  30 tablet    Refill:  0  . DISCONTD: sacubitril-valsartan (ENTRESTO) 24-26 MG    Sig: Take 1 tablet by mouth 2 (two) times daily.    Dispense:  180 tablet    Refill:  2  . sacubitril-valsartan (ENTRESTO) 24-26 MG    Sig: Take 1 tablet by mouth 2 (two) times daily.    Dispense:  180 tablet    Refill:  2    Chief Complaint  Patient presents with  . Shortness of Breath  . Congestive Heart Failure  . Chronic Kidney Disease  . Atrial Fibrillation  .  Anticoagulation  . Hypertension    History of Present Illness:    Kevin Lang is a 72 y.o. male with a hx of COPD mild CAD EF 65%, chronic CHF, chronic Atrial Fibrillation with anticoagulation, sleep apnea on CPAP, HTN, CKD and 4.0 cm thoracic aortic root aneurysm  last seen 2 weeks ago with decompensated heart failure.and worsened LV function now severely reduced EF 20-25%. Compliance with diet, lifestyle and medications: Yes  Although his weight has not changed he is resolved his peripheral edema he still has marked exercise intolerance and dyspnea even with ADLs but no orthopnea or PND chest pain.  Renal function is stable and he will initiate ARNI Past Medical History:  Diagnosis Date  . Aneurysm of thoracic aorta ([place]) 02/16/2017   Overview:  Cardiac cath 12/08/15: Conclusions Diagnostic Procedure Summary 1. Mild non-obstructive CAD 2. Normal LV systolic function 3. Aortogram performed to eval aortic root aneurysm, 4.0 cm by this study.  CT Jan 2017, ascending aaorta aneurysm, 4.8 cm IMOUPDATE  . Benign essential hypertension 02/16/2017  . Cardiomyopathy ([place]) 08/23/2015  . Chronic anticoagulation 05/21/2016  . Chronic combined systolic and diastolic congestive heart failure ([place]) 02/16/2017  . CKD (chronic kidney disease) stage 3, GFR 30-59 ml/min (HCC) 12/31/2015  . COPD (chronic obstructive pulmonary disease) ([place]) 05/21/2016  .  Mild CAD 05/21/2016   Overview:  Cardiac cath 12/08/15:Conclusions Diagnostic Procedure Summary 1. Mild non-obstructive CAD 2. Normal LV systolic function 3. Aortogram performed to eval aortic root aneurysm, 4.0 cm by this study. Diagnostic Procedure Recommendations  . Persistent atrial fibrillation ([place]) 02/16/2017    Past Surgical History:  Procedure Laterality Date  . CORONARY ANGIOPLASTY      Current Medications: Current Meds  Medication Sig  . apixaban (ELIQUIS) 5 MG TABS tablet Take 5 mg by mouth 2 (two) times daily.  Marland Kitchen atorvastatin (LIPITOR) 40  MG tablet Take 40 mg by mouth daily.  . cromolyn (NASALCROM) 5.2 MG/ACT nasal spray Place into the nose.  . metoprolol tartrate (LOPRESSOR) 100 MG tablet Take 100 mg by mouth 2 (two) times daily.   . nitroGLYCERIN (NITROSTAT) 0.4 MG SL tablet Place 0.4 mg under the tongue every 5 (five) hours as needed.  . potassium chloride SA (K-DUR,KLOR-CON) 20 MEQ tablet Take 20 mEq by mouth daily.  Marland Kitchen spironolactone (ALDACTONE) 25 MG tablet Take 1 tablet (25 mg total) by mouth daily.  Marland Kitchen torsemide (DEMADEX) 20 MG tablet Take 20 mg once daily Monday, Wednesday, Friday. All other days take twice daily. (Patient taking differently: Take 20 mg by mouth 2 (two) times daily. Take 20 mg once daily Monday, Wednesday, Friday. All other days take twice daily.)  . [DISCONTINUED] diltiazem (CARDIZEM) 120 MG tablet Take 120 mg by mouth daily.  . [DISCONTINUED] spironolactone (ALDACTONE) 25 MG tablet Take 25 mg by mouth daily.     Allergies:   Patient has no known allergies.   Social History   Socioeconomic History  . Marital status: Married    Spouse name: Not on file  . Number of children: Not on file  . Years of education: Not on file  . Highest education level: Not on file  Occupational History  . Not on file  Social Needs  . Financial resource strain: Not on file  . Food insecurity:    Worry: Not on file    Inability: Not on file  . Transportation needs:    Medical: Not on file    Non-medical: Not on file  Tobacco Use  . Smoking status: Former Smoker    Packs/day: 0.50    Years: 30.00    Pack years: 15.00    Types: Cigarettes    Last attempt to quit: 08/22/2000    Years since quitting: 17.5  . Smokeless tobacco: Never Used  Substance and Sexual Activity  . Alcohol use: No  . Drug use: No  . Sexual activity: Not on file  Lifestyle  . Physical activity:    Days per week: Not on file    Minutes per session: Not on file  . Stress: Not on file  Relationships  . Social connections:    Talks on  phone: Not on file    Gets together: Not on file    Attends religious service: Not on file    Active member of club or organization: Not on file    Attends meetings of clubs or organizations: Not on file    Relationship status: Not on file  Other Topics Concern  . Not on file  Social History Narrative  . Not on file     Family History: The patient's family history includes Aneurysm in his father; Heart disease in his brother; Hypertension in his mother; Stroke in his mother. ROS:   Please see the history of present illness.    All other systems  reviewed and are negative.  EKGs/Labs/Other Studies Reviewed:    The following studies were reviewed today:    Recent Labs: 03/05/2017: BNP 112.0 02/14/2018: BUN 41; Creatinine, Ser 1.81; NT-Pro BNP 1,261; Potassium 4.9; Sodium 144  Recent Lipid Panel No results found for: CHOL, TRIG, HDL, CHOLHDL, VLDL, LDLCALC, LDLDIRECT  Physical Exam:    VS:  BP 140/80 (BP Location: Right Arm, Patient Position: Sitting, Cuff Size: Normal)   Pulse (!) 50   Ht _0  (1.626 m)   Wt 225 lb (102.1 kg)   SpO2 95%   BMI 38.62 kg/m     Wt Readings from Last 3 Encounters:  02/28/18 225 lb (102.1 kg)  02/14/18 226 lb (102.5 kg)  02/05/18 234 lb 12.8 oz (106.5 kg)     GEN:  Well nourished, well developed in no acute distress HEENT: Normal NECK: No JVD; No carotid bruits LYMPHATICS: No lymphadenopathy CARDIAC: Irregular irregular variable first heart sound  no murmurs, rubs, gallops RESPIRATORY:  Clear to auscultation without rales, wheezing or rhonchi  ABDOMEN: Soft, non-tender, non-distended MUSCULOSKELETAL:  No edema; No deformity  SKIN: Warm and dry NEUROLOGIC:  Alert and oriented x 3 PSYCHIATRIC:  Normal affect    Signed, Shirlee More, MD  02/28/2018 1:07 PM    [facility]

## 2018-02-28 NOTE — Patient Instructions (Addendum)
Medication Instructions:  Your physician has recommended you make the following change in your medication:  STOP diltiazem  START entresto 24/26 mg twice daily  Refill for spironolactone has been sent [facility] days to [facility]  Labwork: None  Testing/Procedures: None  Follow-Up: Your physician recommends that you schedule a follow-up appointment in: 4 weeks  Any Other Special Instructions Will Be Listed Below (If Applicable).   Please purchase a blood pressure cuff and check your pressure twice daily 1 hour after medications, call the office if your systolic (top number) of your blood pressure is less than 100.  If you need a refill on your cardiac medications before your next appointment, please call your pharmacy.   [facility]  Garey Ham, RN, BSN   Heart Failure  Weigh yourself every morning when you first wake up and record on a calender or note pad, bring this to your office visits. Using a pill tender can help with taking your medications consistently.  Limit your fluid intake to 2 liters daily  Limit your sodium intake to less than 2-3 grams daily. Ask if you need dietary teaching.  If you gain more than 3 pounds (from your dry weight ), double your dose of diuretic for the day.  If you gain more than 5 pounds (from your dry weight), double your dose of lasix and call your heart failure doctor.  Please do not smoke tobacco since it is very bad for your heart.  Please do not drink alcohol since it can worsen your heart failure.Also avoid OTC nonsteroidal drugs, such as advil, aleve and motrin.  Try to exercise for at least 30 minutes every day because this will help your heart be more efficient. You may be eligible for supervised cardiac rehab, ask your physician.

## 2018-03-06 ENCOUNTER — Other Ambulatory Visit: Payer: Self-pay

## 2018-03-06 DIAGNOSIS — G894 Chronic pain syndrome: Secondary | ICD-10-CM | POA: Diagnosis not present

## 2018-03-06 DIAGNOSIS — M5416 Radiculopathy, lumbar region: Secondary | ICD-10-CM | POA: Diagnosis not present

## 2018-03-06 DIAGNOSIS — C9001 Multiple myeloma in remission: Secondary | ICD-10-CM | POA: Diagnosis not present

## 2018-03-06 DIAGNOSIS — E1142 Type 2 diabetes mellitus with diabetic polyneuropathy: Secondary | ICD-10-CM | POA: Diagnosis not present

## 2018-03-06 MED ORDER — SPIRONOLACTONE 25 MG PO TABS: 25.0000 mg | ORAL_TABLET | Freq: Every day | ORAL | 2 refills | Status: DC

## 2018-03-11 ENCOUNTER — Telehealth: Payer: Self-pay | Admitting: Cardiology

## 2018-03-11 NOTE — Telephone Encounter (Signed)
Samples of entresto will be waiting for the patient. Patient assistance form will also be provided.

## 2018-03-11 NOTE — Telephone Encounter (Signed)
Picked up by patient and his wife.

## 2018-03-11 NOTE — Telephone Encounter (Signed)
Kevin Lang called and patient stated on Entresto and he is going to be out this week and patient is not going to be able to afford it and the [place] does not carry. Please call Kevin Lang and speak wiith her about options.

## 2018-03-19 ENCOUNTER — Other Ambulatory Visit: Payer: Self-pay | Admitting: Emergency Medicine

## 2018-03-19 MED ORDER — SACUBITRIL-VALSARTAN 24-26 MG PO TABS: 1.0000 | ORAL_TABLET | Freq: Two times a day (BID) | ORAL | 3 refills | Status: DC

## 2018-03-25 ENCOUNTER — Telehealth: Payer: Self-pay | Admitting: Cardiology

## 2018-03-25 NOTE — Telephone Encounter (Signed)
Patients daughter Albin Felling notified to discontinue Entresto and do not take an ACE or ARB. Patients daughter verbalized understanding.  Follow up appointment will be made for next week, and she will be made aware of appointment date and time.

## 2018-03-25 NOTE — Telephone Encounter (Signed)
Patients daughter Albin Felling reports that patient has been having swelling and itching in both hands with the right hand swelling more.  He was started on Entresto 24/26 near the end of July.  The swelling started last Wednesday.   There is no complaints of shortness of breath or any other symptoms.  Patient held one dose of Entresto and reports that swelling and itching seemed to get better.   Please advise

## 2018-03-25 NOTE — Telephone Encounter (Signed)
Thinks hes having an allergic reaction to entresto

## 2018-03-25 NOTE — Telephone Encounter (Signed)
Discontinue entresto, do not take an ACE or ARB see me next week re treatment options

## 2018-03-31 DIAGNOSIS — G4730 Sleep apnea, unspecified: Secondary | ICD-10-CM

## 2018-03-31 HISTORY — DX: Sleep apnea, unspecified: G47.30

## 2018-04-01 NOTE — Progress Notes (Signed)
Cardiology Office Note:    Date:  04/02/2018   ID:  Kevin Lang, DOB 04-Dec-1945, MRN [id number]  PCP:  Cathlyn Parsons, PA-C  Cardiologist:  Shirlee More, MD    Referring MD: Cathlyn Parsons, PA-C    ASSESSMENT:    1. Mild CAD   2. Chronic combined systolic and diastolic heart failure (HCC)   3. Heart & renal disease, hypertensive, with heart fail/chron kidney dis ([place])   4. Permanent atrial fibrillation ([place])   5. Chronic obstructive pulmonary disease, unspecified COPD type ([place])   6. CKD (chronic kidney disease) stage 3, GFR 30-59 ml/min (HCC)    PLAN:    In order of problems listed above:  1. Stable having no anginal discomfort we will continue medical treatment 2. Overall improved still mildly fluid overloaded we will continue his current loop diuretic continue his high dose beta-blocker and switch to alternate vasodilator therapy with oral nitrates hydralazine and continue his spironolactone.  With CKD he is at risk for worsening renal function hyperkalemia and recheck renal function and proBNP level today 3. See above we will titrate his vasodilators following home blood pressure 4. Stable rate controlled repeat heart rate less than 70 bpm 5. Stable continue current treatment 6. Recheck renal function today with CKD   Next appointment: 2 months   Medication Adjustments/Labs and Tests Ordered: Current medicines are reviewed at length with the patient today.  Concerns regarding medicines are outlined above.  No orders of the defined types were placed in this encounter.  No orders of the defined types were placed in this encounter.   Chief Complaint  Patient presents with  . Shortness of Breath  . Congestive Heart Failure    History of Present Illness:     Kevin Lang is a 72 y.o. 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 He was started on Entresto and developed angioedema and stopped. He was  last seen by me 02/28/18. Compliance with diet, lifestyle and medications: Yes His weight is been stable at home he is improved he returned to work just finds that he fatigues easily short of breath for more than usual activities he has had no orthopnea was unaware he had 1-2+ edema no chest pain palpitation or syncope he weighs daily and he is been within 1 to 2 pounds of baseline.  Unfortunately intolerant of ARNI although he said he felt much better when he took it.  We will place him with combined vasodilator therapy oral nitrate hydralazine and uptitrate and continue his current loop diuretic.  I will plan to recheck ejection fraction after he reaches optimal therapy and if EF remains reduced consider the value of ICD.  He said no angina.  Past Medical History:  Diagnosis Date  . Aneurysm of thoracic aorta ([place]) 02/16/2017   Overview:  Cardiac cath 12/08/15: Conclusions Diagnostic Procedure Summary 1. Mild non-obstructive CAD 2. Normal LV systolic function 3. Aortogram performed to eval aortic root aneurysm, 4.0 cm by this study.  CT Jan 2017, ascending aaorta aneurysm, 4.8 cm IMOUPDATE  . Benign essential hypertension 02/16/2017  . Cardiomyopathy ([place]) 08/23/2015  . Chronic anticoagulation 05/21/2016  . Chronic combined systolic and diastolic congestive heart failure ([place]) 02/16/2017  . CKD (chronic kidney disease) stage 3, GFR 30-59 ml/min (  [place]) 12/31/2015  . COPD (chronic obstructive pulmonary disease) ([place]) 05/21/2016  . Mild CAD 05/21/2016   Overview:  Cardiac cath 12/08/15:Conclusions Diagnostic Procedure Summary 1. Mild non-obstructive CAD 2. Normal LV systolic function 3. Aortogram performed to eval aortic root aneurysm, 4.0 cm by this study. Diagnostic Procedure Recommendations  . Persistent atrial fibrillation ([place]) 02/16/2017     Past Surgical History:  Procedure Laterality Date  . CORONARY ANGIOPLASTY      Current Medications: Current Meds  Medication Sig  . apixaban (ELIQUIS) 5 MG TABS tablet Take 5 mg by mouth 2 (two) times daily.  Marland Kitchen atorvastatin (LIPITOR) 40 MG tablet Take 40 mg by mouth daily.  . cromolyn (NASALCROM) 5.2 MG/ACT nasal spray Place 1 spray into both nostrils as needed.   . metoprolol tartrate (LOPRESSOR) 100 MG tablet Take 100 mg by mouth 2 (two) times daily.   . nitroGLYCERIN (NITROSTAT) 0.4 MG SL tablet Place 0.4 mg under the tongue every 5 (five) hours as needed.  Marland Kitchen spironolactone (ALDACTONE) 25 MG tablet Take 1 tablet (25 mg total) by mouth daily.  Marland Kitchen torsemide (DEMADEX) 20 MG tablet Take 20 mg once daily Monday, Wednesday, Friday. All other days take twice daily. (Patient taking differently: Take 20 mg by mouth 2 (two) times daily. )     Allergies:   Entresto [sacubitril-valsartan]   Social History   Socioeconomic History  . Marital status: Married    Spouse name: Not on file  . Number of children: Not on file  . Years of education: Not on file  . Highest education level: Not on file  Occupational History  . Not on file  Social Needs  . Financial resource strain: Not on file  . Food insecurity:    Worry: Not on file    Inability: Not on file  . Transportation needs:    Medical: Not on file    Non-medical: Not on file  Tobacco Use  . Smoking status: Former Smoker    Packs/day: 0.50    Years: 30.00    Pack years: 15.00    Types: Cigarettes    Last attempt to quit: 08/22/2000    Years since quitting: 17.6  . Smokeless tobacco: Never Used  Substance and Sexual Activity  . Alcohol use: No  . Drug use: No  . Sexual activity: Not on file  Lifestyle  . Physical activity:    Days per week: Not on file    Minutes per session: Not on file  . Stress: Not on file  Relationships  . Social connections:    Talks on phone: Not on file    Gets together: Not on file     Attends religious service: Not on file    Active member of club or organization: Not on file    Attends meetings of clubs or organizations: Not on file    Relationship status: Not on file  Other Topics Concern  . Not on file  Social History Narrative  . Not on file     Family History: The patient's family history includes Aneurysm in his father; Heart disease in his brother; Hypertension in his mother; Stroke in his mother. ROS:   Please see the history of present illness.    All other systems reviewed and are negative.  EKGs/Labs/Other Studies Reviewed:    The following studies were reviewed today:  EKG:  EKG ordered today.  The ekg ordered today demonstrates AF rapid 126 BPM  Recent Labs: 02/14/2018: BUN 41; Creatinine, Ser  1.81; NT-Pro BNP 1,261; Potassium 4.9; Sodium 144  Recent Lipid Panel No results found for: CHOL, TRIG, HDL, CHOLHDL, VLDL, LDLCALC, LDLDIRECT  Physical Exam:    VS:  BP (!) 142/84 (BP Location: Right Arm, Patient Position: Sitting, Cuff Size: Large)   Pulse (!) 126   Ht 5' 4"  (1.626 m)   Wt 231 lb 12.8 oz (105.1 kg)   SpO2 96%   BMI 39.79 kg/m     Wt Readings from Last 3 Encounters:  04/02/18 231 lb 12.8 oz (105.1 kg)  02/28/18 225 lb (102.1 kg)  02/14/18 226 lb (102.5 kg)     GEN:  Well nourished, well developed in no acute distress HEENT: Normal NECK: No JVD; No carotid bruits LYMPHATICS: No lymphadenopathy CARDIAC: Irr Irr no S3 RRR, no murmurs, rubs, gallops RESPIRATORY:  Clear to auscultation without rales, wheezing or rhonchi  ABDOMEN: Soft, non-tender, non-distended MUSCULOSKELETAL:  1-2+ ankle edema edema; No deformity  SKIN: Warm and dry NEUROLOGIC:  Alert and oriented x 3 PSYCHIATRIC:  Normal affect    Signed, Shirlee More, MD  04/02/2018 2:49 PM    [facility]

## 2018-04-02 ENCOUNTER — Ambulatory Visit (INDEPENDENT_AMBULATORY_CARE_PROVIDER_SITE_OTHER): Payer: Medicare Other | Admitting: Cardiology

## 2018-04-02 VITALS — BP 142/84 | HR 68 | Ht 64.0 in | Wt 231.8 lb

## 2018-04-02 DIAGNOSIS — I2510 Atherosclerotic heart disease of native coronary artery without angina pectoris: Secondary | ICD-10-CM

## 2018-04-02 DIAGNOSIS — N183 Chronic kidney disease, stage 3 unspecified: Secondary | ICD-10-CM

## 2018-04-02 DIAGNOSIS — I5042 Chronic combined systolic (congestive) and diastolic (congestive) heart failure: Secondary | ICD-10-CM

## 2018-04-02 DIAGNOSIS — I482 Chronic atrial fibrillation: Secondary | ICD-10-CM | POA: Diagnosis not present

## 2018-04-02 DIAGNOSIS — I4821 Permanent atrial fibrillation: Secondary | ICD-10-CM

## 2018-04-02 DIAGNOSIS — J449 Chronic obstructive pulmonary disease, unspecified: Secondary | ICD-10-CM

## 2018-04-02 DIAGNOSIS — I130 Hypertensive heart and chronic kidney disease with heart failure and stage 1 through stage 4 chronic kidney disease, or unspecified chronic kidney disease: Secondary | ICD-10-CM

## 2018-04-02 MED ORDER — ISOSORBIDE DINITRATE 10 MG PO TABS: 10.0000 mg | ORAL_TABLET | Freq: Three times a day (TID) | ORAL | 3 refills | Status: DC

## 2018-04-02 MED ORDER — HYDRALAZINE HCL 25 MG PO TABS: 12.5000 mg | ORAL_TABLET | Freq: Three times a day (TID) | ORAL | 3 refills | Status: DC

## 2018-04-02 NOTE — Patient Instructions (Signed)
Medication Instructions:  Your physician has recommended you make the following change in your medication:  START isosorbide dinitrate (isordil) 10 mg three times daily START hydralazine (apresoline) 12.5 mg (0.5 tablets) three times daily   Labwork: Your physician recommends that you return for lab work today: BMP, ProBNP.   Testing/Procedures: None  Follow-Up: Your physician recommends that you schedule a follow-up appointment in: 4 weeks.  **Please check your blood pressure at home twice daily and keep a log of it for the next week. Drop off your completed log at our office for Dr. Dulce Sellar to review at your earliest convenience.   If you need a refill on your cardiac medications before your next appointment, please call your pharmacy.   Thank you for choosing [facility]! Mady Gemma, RN [phone]   Isosorbide Dinitrate, ISDN tablets What is this medicine? ISOSORBIDE DINITRATE (eye soe SOR bide dye NYE trate) is a type of vasodilator. It relaxes blood vessels, increasing the blood and oxygen supply to your heart. This medicine is used to prevent chest pain caused by angina. It will not help to stop an episode of chest pain. This medicine may be used for other purposes; ask your health care provider or pharmacist if you have questions. COMMON BRAND NAME(S): Isordil Titradose, Sorbitrate, Wesorbide What should I tell my health care provider before I take this medicine? They need to know if you have any of these conditions: -previous heart attack or heart failure -an unusual or allergic reaction to isosorbide dinitrate, nitrates, other medicines, foods, dyes, or preservatives -pregnant or trying to get pregnant -breast-feeding How should I use this medicine? Take this medicine by mouth with a glass of water. Follow the directions on the prescription label. Take this medicine on an empty stomach, at least 30 minutes before or 2 hours after food. Do not take with food.  Take your medicine at regular intervals. Do not take your medicine more often than directed. Do not stop taking this medicine suddenly or your symptoms may get worse. Ask your doctor or health care professional how to gradually reduce the dose. Talk to your pediatrician regarding the use of this medicine in children. Special care may be needed. Overdosage: If you think you have taken too much of this medicine contact a poison control center or emergency room at once. NOTE: This medicine is only for you. Do not share this medicine with others. What if I miss a dose? If you miss a dose, take it as soon as you can. If it is almost time for your next dose, take only that dose. Do not take double or extra doses. What may interact with this medicine? Do not take this medicine with any of the following medications: -medicines used to treat erectile dysfunction (ED) like avanafil, sildenafil, tadalafil, and vardenafil -riociguat This medicine may also interact with the following medications: -medicines for high blood pressure -other medicines for angina or heart failure This list may not describe all possible interactions. Give your health care provider a list of all the medicines, herbs, non-prescription drugs, or dietary supplements you use. Also tell them if you smoke, drink alcohol, or use illegal drugs. Some items may interact with your medicine. What should I watch for while using this medicine? Check your heart rate and blood pressure regularly while you are taking this medicine. Ask your doctor or health care professional what your heart rate and blood pressure should be and when you should contact him or her. Tell your doctor or  health care professional if you feel your medicine is no longer working. You may get dizzy. Do not drive, use machinery, or do anything that needs mental alertness until you know how this medicine affects you. To reduce the risk of dizzy or fainting spells, do not sit or  stand up quickly, especially if you are an older patient. Alcohol can make you more dizzy, and increase flushing and rapid heartbeats. Avoid alcoholic drinks. Do not treat yourself for coughs, colds, or pain while you are taking this medicine without asking your doctor or health care professional for advice. Some ingredients may increase your blood pressure. What side effects may I notice from receiving this medicine? Side effects that you should report to your doctor or health care professional as soon as possible: -bluish discoloration of lips, fingernails, or palms of hands -irregular heartbeat, palpitations -low blood pressure -nausea, vomiting -persistent headache -unusually weak or tired Side effects that usually do not require medical attention (report to your doctor or health care professional if they continue or are bothersome): -flushing of the face or neck -rash This list may not describe all possible side effects. Call your doctor for medical advice about side effects. You may report side effects to FDA at 1-800-FDA-1088. Where should I keep my medicine? Keep out of the reach of children. Store at room temperature, approximately 25 degrees C (77 degrees F). Protect from light. Keep container tightly closed. Throw away any unused medicine after the expiration date. NOTE: This sheet is a summary. It may not cover all possible information. If you have questions about this medicine, talk to your doctor, pharmacist, or health care provider.  2018 Elsevier/Gold Standard (2013-05-16 14:47:34)   Hydralazine tablets What is this medicine? HYDRALAZINE (hye DRAL a zeen) is a type of vasodilator. It relaxes blood vessels, increasing the blood and oxygen supply to your heart. This medicine is used to treat high blood pressure. This medicine may be used for other purposes; ask your health care provider or pharmacist if you have questions. COMMON BRAND NAME(S): Apresoline What should I tell  my health care provider before I take this medicine? They need to know if you have any of these conditions: -blood vessel disease -heart disease including angina or history of heart attack -kidney or liver disease -systemic lupus erythematosus (SLE) -an unusual or allergic reaction to hydralazine, tartrazine dye, other medicines, foods, dyes, or preservatives -pregnant or trying to get pregnant -breast-feeding How should I use this medicine? Take this medicine by mouth with a glass of water. Follow the directions on the prescription label. Take your doses at regular intervals. Do not take your medicine more often than directed. Do not stop taking except on the advice of your doctor or health care professional. Talk to your pediatrician regarding the use of this medicine in children. Special care may be needed. While this drug may be prescribed for children for selected conditions, precautions do apply. Overdosage: If you think you have taken too much of this medicine contact a poison control center or emergency room at once. NOTE: This medicine is only for you. Do not share this medicine with others. What if I miss a dose? If you miss a dose, take it as soon as you can. If it is almost time for your next dose, take only that dose. Do not take double or extra doses. What may interact with this medicine? -medicines for high blood pressure -medicines for mental depression This list may not describe all possible interactions.  Give your health care provider a list of all the medicines, herbs, non-prescription drugs, or dietary supplements you use. Also tell them if you smoke, drink alcohol, or use illegal drugs. Some items may interact with your medicine. What should I watch for while using this medicine? Visit your doctor or health care professional for regular checks on your progress. Check your blood pressure and pulse rate regularly. Ask your doctor or health care professional what your blood  pressure and pulse rate should be and when you should contact him or her. You may get drowsy or dizzy. Do not drive, use machinery, or do anything that needs mental alertness until you know how this medicine affects you. Do not stand or sit up quickly, especially if you are an older patient. This reduces the risk of dizzy or fainting spells. Alcohol may interfere with the effect of this medicine. Avoid alcoholic drinks. Do not treat yourself for coughs, colds, or pain while you are taking this medicine without asking your doctor or health care professional for advice. Some ingredients may increase your blood pressure. What side effects may I notice from receiving this medicine? Side effects that you should report to your doctor or health care professional as soon as possible: -chest pain, or fast or irregular heartbeat -fever, chills, or sore throat -numbness or tingling in the hands or feet -shortness of breath -skin rash, redness, blisters or itching -stiff or swollen joints -sudden weight gain -swelling of the feet or legs -swollen lymph glands -unusual weakness Side effects that usually do not require medical attention (report to your doctor or health care professional if they continue or are bothersome): -diarrhea, or constipation -headache -loss of appetite -nausea, vomiting This list may not describe all possible side effects. Call your doctor for medical advice about side effects. You may report side effects to FDA at 1-800-FDA-1088. Where should I keep my medicine? Keep out of the reach of children. Store at room temperature between 15 and 30 degrees C (59 and 86 degrees F). Throw away any unused medicine after the expiration date. NOTE: This sheet is a summary. It may not cover all possible information. If you have questions about this medicine, talk to your doctor, pharmacist, or health care provider.  2018 Elsevier/Gold Standard (2007-11-29 15:44:58)

## 2018-04-03 ENCOUNTER — Telehealth: Payer: Self-pay

## 2018-04-03 LAB — BASIC METABOLIC PANEL
BUN / CREAT RATIO: 16 (ref 10–24)
BUN: 27 mg/dL (ref 8–27)
CALCIUM: 9.1 mg/dL (ref 8.6–10.2)
CO2: 24 mmol/L (ref 20–29)
CREATININE: 1.73 mg/dL — AB (ref 0.76–1.27)
Chloride: 103 mmol/L (ref 96–106)
GFR, EST AFRICAN AMERICAN: 44 mL/min/{1.73_m2} — AB (ref >59)
GFR, EST NON AFRICAN AMERICAN: 38 mL/min/{1.73_m2} — AB (ref >59)
GLUCOSE: 87 mg/dL (ref 65–99)
Potassium: 3.5 mmol/L (ref 3.5–5.2)
SODIUM: 145 mmol/L — AB (ref 134–144)

## 2018-04-03 LAB — PRO B NATRIURETIC PEPTIDE: NT-Pro BNP: 3692 pg/mL — ABNORMAL HIGH (ref 0–376)

## 2018-04-03 MED ORDER — POTASSIUM CHLORIDE ER 10 MEQ PO TBCR: 10.0000 meq | EXTENDED_RELEASE_TABLET | Freq: Every day | ORAL | 3 refills | Status: DC

## 2018-04-03 NOTE — Telephone Encounter (Signed)
Patient's daughter, Albin Felling, per [facility] informed of results. Potassium prescription sent to [facility] in [place] as requested. Carla verbalized understanding. No further questions.

## 2018-04-03 NOTE — Telephone Encounter (Signed)
Left message on patients on home phone and on Carla's voicemail to return call for lab results.

## 2018-04-12 ENCOUNTER — Inpatient Hospital Stay: Payer: Medicare HMO | Attending: Oncology

## 2018-04-12 DIAGNOSIS — M25559 Pain in unspecified hip: Secondary | ICD-10-CM | POA: Insufficient documentation

## 2018-04-12 DIAGNOSIS — M549 Dorsalgia, unspecified: Secondary | ICD-10-CM | POA: Insufficient documentation

## 2018-04-12 DIAGNOSIS — C9001 Multiple myeloma in remission: Secondary | ICD-10-CM | POA: Insufficient documentation

## 2018-04-12 DIAGNOSIS — R109 Unspecified abdominal pain: Secondary | ICD-10-CM | POA: Insufficient documentation

## 2018-04-12 DIAGNOSIS — D649 Anemia, unspecified: Secondary | ICD-10-CM | POA: Diagnosis not present

## 2018-04-12 LAB — CMP (CANCER CENTER ONLY)
ALT: 18 U/L (ref 0–44)
AST: 19 U/L (ref 15–41)
Albumin: 3.6 g/dL (ref 3.5–5.0)
Alkaline Phosphatase: 71 U/L (ref 38–126)
Anion gap: 11 (ref 5–15)
BUN: 11 mg/dL (ref 8–23)
CO2: 25 mmol/L (ref 22–32)
Calcium: 9.4 mg/dL (ref 8.9–10.3)
Chloride: 104 mmol/L (ref 98–111)
Creatinine: 1.35 mg/dL — ABNORMAL HIGH (ref 0.61–1.24)
GFR, Est AFR Am: 58 mL/min — ABNORMAL LOW (ref >60)
GFR, Estimated: 50 mL/min — ABNORMAL LOW (ref >60)
Glucose, Bld: 278 mg/dL — ABNORMAL HIGH (ref 70–99)
Potassium: 3.8 mmol/L (ref 3.5–5.1)
Sodium: 140 mmol/L (ref 135–145)
Total Bilirubin: 0.4 mg/dL (ref 0.3–1.2)
Total Protein: 8.7 g/dL — ABNORMAL HIGH (ref 6.5–8.1)

## 2018-04-12 LAB — CBC WITH DIFFERENTIAL (CANCER CENTER ONLY)
Basophils Absolute: 0 10*3/uL (ref 0.0–0.1)
Basophils Relative: 0 %
Eosinophils Absolute: 0.1 10*3/uL (ref 0.0–0.5)
Eosinophils Relative: 2 %
HCT: 36.4 % — ABNORMAL LOW (ref 38.4–49.9)
Hemoglobin: 12.1 g/dL — ABNORMAL LOW (ref 13.0–17.1)
Lymphocytes Relative: 31 %
Lymphs Abs: 1.7 10*3/uL (ref 0.9–3.3)
MCH: 29.9 pg (ref 27.2–33.4)
MCHC: 33.2 g/dL (ref 32.0–36.0)
MCV: 89.9 fL (ref 79.3–98.0)
Monocytes Absolute: 0.5 10*3/uL (ref 0.1–0.9)
Monocytes Relative: 8 %
Neutro Abs: 3.3 10*3/uL (ref 1.5–6.5)
Neutrophils Relative %: 59 %
Platelet Count: 191 10*3/uL (ref 140–400)
RBC: 4.05 MIL/uL — ABNORMAL LOW (ref 4.20–5.82)
RDW: 14.8 % — ABNORMAL HIGH (ref 11.0–14.6)
WBC Count: 5.6 10*3/uL (ref 4.0–10.3)

## 2018-04-15 LAB — MULTIPLE MYELOMA PANEL, SERUM
Albumin SerPl Elph-Mcnc: 3.7 g/dL (ref 2.9–4.4)
Albumin/Glob SerPl: 0.9 (ref 0.7–1.7)
Alpha 1: 0.2 g/dL (ref 0.0–0.4)
Alpha2 Glob SerPl Elph-Mcnc: 0.7 g/dL (ref 0.4–1.0)
B-Globulin SerPl Elph-Mcnc: 2.9 g/dL — ABNORMAL HIGH (ref 0.7–1.3)
Gamma Glob SerPl Elph-Mcnc: 0.6 g/dL (ref 0.4–1.8)
Globulin, Total: 4.4 g/dL — ABNORMAL HIGH (ref 2.2–3.9)
IgA: 2541 mg/dL — ABNORMAL HIGH (ref 61–437)
IgG (Immunoglobin G), Serum: 627 mg/dL — ABNORMAL LOW (ref 700–1600)
IgM (Immunoglobulin M), Srm: 11 mg/dL — ABNORMAL LOW (ref 15–143)
M Protein SerPl Elph-Mcnc: 2.1 g/dL — ABNORMAL HIGH
Total Protein ELP: 8.1 g/dL (ref 6.0–8.5)

## 2018-04-15 LAB — KAPPA/LAMBDA LIGHT CHAINS
Kappa free light chain: 19.6 mg/L — ABNORMAL HIGH (ref 3.3–19.4)
Kappa, lambda light chain ratio: 1.83 — ABNORMAL HIGH (ref 0.26–1.65)
Lambda free light chains: 10.7 mg/L (ref 5.7–26.3)

## 2018-04-16 ENCOUNTER — Other Ambulatory Visit: Payer: Self-pay | Admitting: Orthopedic Surgery

## 2018-04-16 DIAGNOSIS — M65351 Trigger finger, right little finger: Secondary | ICD-10-CM | POA: Diagnosis not present

## 2018-04-17 ENCOUNTER — Telehealth: Payer: Self-pay | Admitting: Cardiology

## 2018-04-17 ENCOUNTER — Other Ambulatory Visit: Payer: Self-pay

## 2018-04-17 MED ORDER — POTASSIUM CHLORIDE ER 10 MEQ PO TBCR: 10.0000 meq | EXTENDED_RELEASE_TABLET | Freq: Every day | ORAL | 3 refills | Status: DC

## 2018-04-17 MED ORDER — HYDRALAZINE HCL 25 MG PO TABS: 12.5000 mg | ORAL_TABLET | Freq: Three times a day (TID) | ORAL | 3 refills | Status: DC

## 2018-04-17 MED ORDER — SPIRONOLACTONE 25 MG PO TABS: 25.0000 mg | ORAL_TABLET | Freq: Every day | ORAL | 3 refills | Status: AC

## 2018-04-17 MED ORDER — APIXABAN 5 MG PO TABS: 5.0000 mg | ORAL_TABLET | Freq: Two times a day (BID) | ORAL | 3 refills | Status: AC

## 2018-04-17 MED ORDER — ATORVASTATIN CALCIUM 40 MG PO TABS: 40.0000 mg | ORAL_TABLET | Freq: Every day | ORAL | 3 refills | Status: AC

## 2018-04-17 MED ORDER — TORSEMIDE 20 MG PO TABS: 20.0000 mg | ORAL_TABLET | Freq: Two times a day (BID) | ORAL | 3 refills | Status: AC

## 2018-04-17 MED ORDER — ISOSORBIDE DINITRATE 10 MG PO TABS: 10.0000 mg | ORAL_TABLET | Freq: Three times a day (TID) | ORAL | 3 refills | Status: DC

## 2018-04-17 MED ORDER — METOPROLOL TARTRATE 100 MG PO TABS: 100.0000 mg | ORAL_TABLET | Freq: Two times a day (BID) | ORAL | 3 refills | Status: AC

## 2018-04-17 NOTE — Telephone Encounter (Signed)
Please print scripts and have [facility] sign. Fax number to send them to is [phone]. Thank you.

## 2018-04-17 NOTE — Telephone Encounter (Signed)
Rx's printed for Dr Dulce Sellar to sign as requested to be sent to [facility]

## 2018-04-17 NOTE — Telephone Encounter (Signed)
°  Patient needs printed scripts for medications to take to the [place]

## 2018-04-17 NOTE — Telephone Encounter (Signed)
rx printed as requested 

## 2018-04-18 DIAGNOSIS — M6208 Separation of muscle (nontraumatic), other site: Secondary | ICD-10-CM | POA: Diagnosis not present

## 2018-04-19 ENCOUNTER — Telehealth: Payer: Self-pay

## 2018-04-19 ENCOUNTER — Inpatient Hospital Stay (HOSPITAL_BASED_OUTPATIENT_CLINIC_OR_DEPARTMENT_OTHER): Payer: Medicare HMO | Admitting: Oncology

## 2018-04-19 VITALS — BP 156/67 | HR 90 | Temp 98.4°F | Resp 18 | Ht 72.0 in | Wt 210.2 lb

## 2018-04-19 DIAGNOSIS — C9001 Multiple myeloma in remission: Secondary | ICD-10-CM

## 2018-04-19 DIAGNOSIS — R109 Unspecified abdominal pain: Secondary | ICD-10-CM | POA: Diagnosis not present

## 2018-04-19 DIAGNOSIS — D649 Anemia, unspecified: Secondary | ICD-10-CM | POA: Diagnosis not present

## 2018-04-19 DIAGNOSIS — M549 Dorsalgia, unspecified: Secondary | ICD-10-CM

## 2018-04-19 DIAGNOSIS — M25559 Pain in unspecified hip: Secondary | ICD-10-CM | POA: Diagnosis not present

## 2018-04-19 NOTE — Telephone Encounter (Signed)
Printed avs and calender of upcoming appointment. Per 9/20 los 

## 2018-04-19 NOTE — Progress Notes (Signed)
Hematology and Oncology Follow Up Visit  James [name] [id number] 02/07/1946 72 y.o. 04/19/2018 3:35 PM [name], [name], MDEhinger, [name], MD   Principle Diagnosis: 72 year old man with multiple myeloma diagnosed in June 2017 after presenting with elevated IgA level of 3700.  He had close to 20% plasma cell in the bone marrow.   Prior therapy:  Revlimid and dexamethasone started on January 28 2016. Revlimid at 25 mg daily for 21 days with dexamethasone at 20 mg weekly. Therapy discontinued in January 2018 after achieving a complete response.  Current therapy: Active surveillance.   Interim History:  James [name] is here for a return visit.  Since the last visit, he reports no major changes in his health.  He has few complaints including chronic back pain which she receives epidural injections periodically.  He also noticed a protrusion in the middle of his abdomen suspicious for hernia and he is referred to [facility] surgery.  In the meantime, he still ambulates without any difficulties in managing his pain reasonably well.  He continues to perform activities of daily living without any decline.  His appetite remains adequate without any recent pathological fractures or worsening joint pain.  He does not report any headaches, blurry vision, syncope or seizures.  He denies any dizziness or confusion.  He does not report any fevers or chills or sweats.  Denies any chest pain, palpitation orthopnea.  Does not report any cough or hemoptysis or hematemesis.  He reports no nausea or vomiting or abdominal pain.  He denies any changes in bowel habits.  He does not report any skin rashes or lesions.  He does not report any bleeding or clotting tendency.  He denies any heat or cold intolerance.  He denies any mood changes.  The remaining review of systems is negative.    Medications: I have reviewed the patient's current medications.  Current Outpatient Medications  Medication Sig Dispense  Refill  . amLODipine (NORVASC) 10 MG tablet Take 10 mg by mouth every morning.     . famotidine (PEPCID) 20 MG tablet Take 1 tablet (20 mg total) by mouth 2 (two) times daily. 10 tablet 0  . ferrous sulfate 325 (65 FE) MG tablet Take 325 mg by mouth 2 (two) times daily with a meal.     . finasteride (PROSCAR) 5 MG tablet Take 5 mg by mouth daily.    Marland Kitchen gabapentin (NEURONTIN) 400 MG capsule Take 400 mg by mouth 3 (three) times daily.     Marland Kitchen glimepiride (AMARYL) 2 MG tablet Take 2 mg by mouth daily.    Marland Kitchen HYDROcodone-acetaminophen (NORCO) 10-325 MG tablet Take 1 tablet by mouth.    . losartan (COZAAR) 50 MG tablet Take 50 mg by mouth every morning.     . metFORMIN (GLUCOPHAGE-XR) 500 MG 24 hr tablet Take 1,000 mg by mouth 2 (two) times daily.     Marland Kitchen omeprazole (PRILOSEC) 20 MG capsule Take 20 mg by mouth 4 (four) times daily.     . Tamsulosin HCl (FLOMAX) 0.4 MG CAPS Take 0.4 mg by mouth 2 (two) times daily.     . traMADol (ULTRAM) 50 MG tablet Take 50 mg by mouth 4 (four) times daily -  with meals and at bedtime. For pain     No current facility-administered medications for this visit.     Allergies:  Allergies  Allergen Reactions  . Invokana [Canagliflozin] Anaphylaxis    Facial and lip swelling    Past Medical History,  Surgical history, Social history, and Family History were reviewed and updated.   Physical Exam:  Blood pressure (!) 156/67, pulse 90, temperature 98.4 F (36.9 C), temperature source Oral, resp. rate 18, height 6' (1.829 m), weight 210 lb 3.2 oz (95.3 kg), SpO2 100 %.   ECOG: 1   General appearance: Comfortable appearing without any discomfort Head: Normocephalic without any trauma Oropharynx: Mucous membranes are moist and pink without any thrush or ulcers. Eyes: Pupils are equal and round reactive to light. Lymph nodes: No cervical, supraclavicular, inguinal or axillary lymphadenopathy.   Heart:regular rate and rhythm.  S1 and S2 without leg edema. Lung: Clear  without any rhonchi or wheezes.  No dullness to percussion. Abdomin: Soft, nontender, nondistended.  Protrusion noted in the mid epigastric area.  Good bowel sounds. Musculoskeletal: No joint deformity or effusion.  Full range of motion noted. Neurological: No deficits noted on motor, sensory and deep tendon reflex exam. Skin: No petechial rash or dryness.  Appeared moist.    Lab Results: Lab Results  Component Value Date   WBC 5.6 04/12/2018   HGB 12.1 (L) 04/12/2018   HCT 36.4 (L) 04/12/2018   MCV 89.9 04/12/2018   PLT 191 04/12/2018     Chemistry      Component Value Date/Time   NA 140 04/12/2018 1059   NA 138 06/26/2017 0946   K 3.8 04/12/2018 1059   K 3.8 06/26/2017 0946   CL 104 04/12/2018 1059   CL 105 08/09/2012 0918   CO2 25 04/12/2018 1059   CO2 25 06/26/2017 0946   BUN 11 04/12/2018 1059   BUN 16.4 06/26/2017 0946   CREATININE 1.35 (H) 04/12/2018 1059   CREATININE 1.2 06/26/2017 0946      Component Value Date/Time   CALCIUM 9.4 04/12/2018 1059   CALCIUM 9.3 06/26/2017 0946   ALKPHOS 71 04/12/2018 1059   ALKPHOS 75 06/26/2017 0946   AST 19 04/12/2018 1059   AST 11 06/26/2017 0946   ALT 18 04/12/2018 1059   ALT 14 06/26/2017 0946   BILITOT 0.4 04/12/2018 1059   BILITOT 0.31 06/26/2017 0946      Results for James [name], James [name] (MRN [id number]) as of 04/19/2018 15:21  Ref. Range 01/10/2018 15:12 04/12/2018 10:59  IgA Latest Ref Range: 61 - 437 mg/dL 2,088 (H) 2,541 (H)   Results for James [name], James [name] (MRN [id number]) as of 04/19/2018 15:21  Ref. Range 01/10/2018 15:12 04/12/2018 10:59  M Protein SerPl Elph-Mcnc Latest Ref Range: Not Observed g/dL 1.5 (H) 2.1 (H)      Impression and Plan:  72 year old with:  1. IgA Kappa Multiple myeloma diagnosed and 2017.  He was treated with Revlimid and dexamethasone and achieved partial response at the time.  He has been on active surveillance since January 2018 without any symptoms to necessitate active  treatment.  Protein studies obtained on 04/12/2018 was personally reviewed today and showed increase in his M protein as well as IgA level.  The natural course of this disease and options of therapy was reviewed today.  These options would include continued active surveillance, restart treating with Revlimid based therapy or restarting treatment with Velcade based therapy.  After discussion today, he is asymptomatic from his myeloma and would like to defer systemic therapy unless needed 2.  I think is reasonable to continue with active surveillance at this time and repeat protein studies and skeletal survey in January 2020.  If he has any signs of endorgan damage including worsening  renal insufficiency, anemia or any bone lesions we will consider myeloma therapy.  2.  Back pain and hip pain: Unchanged at this time he continues to follow pain clinic regarding this issue.  3.  Anemia: His hemoglobin continues to be above 12 without any indication of worsening anemia that requires treatment at this time.  We will continue to monitor these findings.   4.  Abdominal protrusion and mild discomfort: He has an appointment with [facility] [facility] to evaluate for possible hernia.  5.  Follow-up: In in 3 months for repeat labs and physical exam.  15  minutes was spent with the patient face-to-face today.  More than 50% of time was dedicated to reviewing his laboratory data, differential diagnosis and treatment options.    Zola Button, MD 9/20/20193:35 PM

## 2018-04-25 DIAGNOSIS — M6208 Separation of muscle (nontraumatic), other site: Secondary | ICD-10-CM | POA: Diagnosis not present

## 2018-04-26 ENCOUNTER — Other Ambulatory Visit: Payer: Self-pay

## 2018-04-26 ENCOUNTER — Encounter (HOSPITAL_BASED_OUTPATIENT_CLINIC_OR_DEPARTMENT_OTHER): Payer: Self-pay | Admitting: *Deleted

## 2018-04-29 DIAGNOSIS — G4733 Obstructive sleep apnea (adult) (pediatric): Secondary | ICD-10-CM | POA: Diagnosis not present

## 2018-04-29 NOTE — Progress Notes (Signed)
Cardiology Office Note:    Date:  04/30/2018   ID:  Kevin Lang, DOB 1946/06/28, MRN [id number]  PCP:  Cathlyn Parsons, PA-C  Cardiologist:  Shirlee More, MD    Referring MD: Cathlyn Parsons, PA-C    ASSESSMENT:    1. Chronic combined systolic and diastolic heart failure ([place])   2. Heart & renal disease, hypertensive, with heart fail/chron kidney dis ([place])   3. Permanent atrial fibrillation ([place])   4. Chronic anticoagulation   5. Hypokalemia    PLAN:    In order of problems listed above:  1. His heart failure is improved he has no volume overload New [place] Heart Association class II.  We will up titrate his vasodilators he declined being referred for investigational drug study collected.  I will see him back in the office in 6 weeks to uptitrate again he will call me if he changes his mind about drug research.  He will continue his current loop diuretic 2. See above recent creatinine is stable 3. Rate controlled continue beta-blocker and anticoagulant  4. Continue his anticoagulant 5. Improved continue potassium   Next appointment: 6 weeks   Medication Adjustments/Labs and Tests Ordered: Current medicines are reviewed at length with the patient today.  Concerns regarding medicines are outlined above.  No orders of the defined types were placed in this encounter.  No orders of the defined types were placed in this encounter.   Chief Complaint  Patient presents with  . 1 month follow up    heart failure    History of Present Illness:    Kevin Lang is a 72 y.o. 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 He was started on Entresto and developed angioedema and stopped. He was  last seen 04/02/18 and started on hydralazine/  isosorbide.  Compliance with diet, lifestyle and medications: Yes He is able to work but still short of breath activities beyond ADLs such as climbing stairs and doing work in the garage.  No edema orthopnea chest pain palpitation or syncope.  Will up titrate his vasodilators I offered in drug research chronotropic drug and he declines.  After optimization of medications were recheck echocardiogram make a decision whether to offer him ICD therapy. Past Medical History:  Diagnosis Date  . Aneurysm of thoracic aorta ([place]) 02/16/2017   Overview:  Cardiac cath 12/08/15: Conclusions Diagnostic Procedure Summary 1. Mild non-obstructive CAD 2. Normal LV systolic function 3. Aortogram performed to eval aortic root aneurysm, 4.0 cm by this study.  CT Jan 2017, ascending aaorta aneurysm, 4.8 cm IMOUPDATE  . Benign essential hypertension 02/16/2017  . Cardiomyopathy ([place]) 08/23/2015  . Chronic anticoagulation 05/21/2016  . Chronic combined systolic and diastolic congestive heart failure ([place]) 02/16/2017  . CKD (chronic kidney disease) stage 3, GFR 30-59 ml/min ([facility]) 12/31/2015  . COPD (chronic obstructive pulmonary disease) ([place]) 05/21/2016  . Mild CAD 05/21/2016   Overview:  Cardiac cath 12/08/15:Conclusions Diagnostic Procedure Summary 1. Mild non-obstructive CAD 2. Normal LV systolic function 3. Aortogram performed to eval aortic root aneurysm, 4.0 cm by this study. Diagnostic Procedure Recommendations  . Persistent atrial fibrillation 02/16/2017    Past Surgical History:  Procedure Laterality Date  . CORONARY ANGIOPLASTY      Current Medications: Current Meds  Medication Sig  .  apixaban (ELIQUIS) 5 MG TABS tablet Take 1 tablet (5 mg total) by mouth 2 (two) times daily.  Marland Kitchen atorvastatin (LIPITOR) 40 MG tablet Take 1 tablet (40 mg total) by mouth daily.  . cromolyn (NASALCROM) 5.2 MG/ACT nasal spray Place 1 spray into both nostrils as needed.   . hydrALAZINE (APRESOLINE) 25 MG tablet Take 0.5 tablets  (12.5 mg total) by mouth 3 (three) times daily.  . isosorbide dinitrate (ISORDIL) 10 MG tablet Take 1 tablet (10 mg total) by mouth 3 (three) times daily.  . metoprolol tartrate (LOPRESSOR) 100 MG tablet Take 1 tablet (100 mg total) by mouth 2 (two) times daily.  . nitroGLYCERIN (NITROSTAT) 0.4 MG SL tablet Place 0.4 mg under the tongue every 5 (five) hours as needed.  . potassium chloride (K-DUR) 10 MEQ tablet Take 1 tablet (10 mEq total) by mouth daily.  Marland Kitchen spironolactone (ALDACTONE) 25 MG tablet Take 1 tablet (25 mg total) by mouth daily.  Marland Kitchen torsemide (DEMADEX) 20 MG tablet Take 1 tablet (20 mg total) by mouth 2 (two) times daily.     Allergies:   Entresto [sacubitril-valsartan]   Social History   Socioeconomic History  . Marital status: Married    Spouse name: Not on file  . Number of children: Not on file  . Years of education: Not on file  . Highest education level: Not on file  Occupational History  . Not on file  Social Needs  . Financial resource strain: Not on file  . Food insecurity:    Worry: Not on file    Inability: Not on file  . Transportation needs:    Medical: Not on file    Non-medical: Not on file  Tobacco Use  . Smoking status: Former Smoker    Packs/day: 0.50    Years: 30.00    Pack years: 15.00    Types: Cigarettes    Last attempt to quit: 08/22/2000    Years since quitting: 17.6  . Smokeless tobacco: Never Used  Substance and Sexual Activity  . Alcohol use: No  . Drug use: No  . Sexual activity: Not on file  Lifestyle  . Physical activity:    Days per week: Not on file    Minutes per session: Not on file  . Stress: Not on file  Relationships  . Social connections:    Talks on phone: Not on file    Gets together: Not on file    Attends religious service: Not on file    Active member of club or organization: Not on file    Attends meetings of clubs or organizations: Not on file    Relationship status: Not on file  Other Topics Concern  . Not  on file  Social History Narrative  . Not on file     Family History: The patient's family history includes Aneurysm in his father; Heart disease in his brother; Hypertension in his mother; Stroke in his mother. ROS:   Please see the history of present illness.    All other systems reviewed and are negative.  EKGs/Labs/Other Studies Reviewed:    The following studies were reviewed today  Recent Labs: 04/02/2018: BUN 27; Creatinine, Ser 1.73; NT-Pro BNP 3,692; Potassium 3.5; Sodium 145  Recent Lipid Panel No results found for: CHOL, TRIG, HDL, CHOLHDL, VLDL, LDLCALC, LDLDIRECT  Physical Exam:    VS:  BP 116/62   Pulse 95   Ht _0  (1.626 m)   Wt 233 lb 3.2 oz (105.8 kg)  SpO2 95%   BMI 40.03 kg/m     Wt Readings from Last 3 Encounters:  04/30/18 233 lb 3.2 oz (105.8 kg)  04/02/18 231 lb 12.8 oz (105.1 kg)  02/28/18 225 lb (102.1 kg)     GEN:  Well nourished, well developed in no acute distress HEENT: Normal NECK: No JVD; No carotid bruits LYMPHATICS: No lymphadenopathy CARDIAC: Irregular irregular variable first heart sound no murmurs, rubs, gallops RESPIRATORY:  Clear to auscultation without rales, wheezing or rhonchi  ABDOMEN: Soft, non-tender, non-distended MUSCULOSKELETAL:  No edema; No deformity  SKIN: Warm and dry NEUROLOGIC:  Alert and oriented x 3 PSYCHIATRIC:  Normal affect    Signed, Shirlee More, MD  04/30/2018 1:51 PM    [facility]

## 2018-04-30 ENCOUNTER — Encounter (HOSPITAL_BASED_OUTPATIENT_CLINIC_OR_DEPARTMENT_OTHER)
Admission: RE | Admit: 2018-04-30 | Discharge: 2018-04-30 | Disposition: A | Discharge status: Home / Self Care | Payer: Medicare HMO | Source: Ambulatory Visit | Attending: Orthopedic Surgery | Admitting: Orthopedic Surgery

## 2018-04-30 ENCOUNTER — Ambulatory Visit (INDEPENDENT_AMBULATORY_CARE_PROVIDER_SITE_OTHER): Payer: Medicare Other | Admitting: Cardiology

## 2018-04-30 ENCOUNTER — Encounter: Payer: Self-pay | Admitting: Cardiology

## 2018-04-30 VITALS — BP 116/62 | HR 95 | Ht 64.0 in | Wt 233.2 lb

## 2018-04-30 DIAGNOSIS — Z7901 Long term (current) use of anticoagulants: Secondary | ICD-10-CM

## 2018-04-30 DIAGNOSIS — I130 Hypertensive heart and chronic kidney disease with heart failure and stage 1 through stage 4 chronic kidney disease, or unspecified chronic kidney disease: Secondary | ICD-10-CM | POA: Diagnosis not present

## 2018-04-30 DIAGNOSIS — I4821 Permanent atrial fibrillation: Secondary | ICD-10-CM

## 2018-04-30 DIAGNOSIS — I5042 Chronic combined systolic (congestive) and diastolic (congestive) heart failure: Secondary | ICD-10-CM

## 2018-04-30 DIAGNOSIS — E876 Hypokalemia: Secondary | ICD-10-CM | POA: Diagnosis not present

## 2018-04-30 DIAGNOSIS — N400 Enlarged prostate without lower urinary tract symptoms: Secondary | ICD-10-CM | POA: Diagnosis not present

## 2018-04-30 DIAGNOSIS — K219 Gastro-esophageal reflux disease without esophagitis: Secondary | ICD-10-CM | POA: Diagnosis not present

## 2018-04-30 DIAGNOSIS — I10 Essential (primary) hypertension: Secondary | ICD-10-CM | POA: Diagnosis not present

## 2018-04-30 DIAGNOSIS — M65351 Trigger finger, right little finger: Secondary | ICD-10-CM | POA: Diagnosis not present

## 2018-04-30 DIAGNOSIS — E785 Hyperlipidemia, unspecified: Secondary | ICD-10-CM | POA: Diagnosis not present

## 2018-04-30 DIAGNOSIS — Z79899 Other long term (current) drug therapy: Secondary | ICD-10-CM | POA: Diagnosis not present

## 2018-04-30 DIAGNOSIS — Z87891 Personal history of nicotine dependence: Secondary | ICD-10-CM | POA: Diagnosis not present

## 2018-04-30 DIAGNOSIS — E079 Disorder of thyroid, unspecified: Secondary | ICD-10-CM | POA: Diagnosis not present

## 2018-04-30 DIAGNOSIS — E119 Type 2 diabetes mellitus without complications: Secondary | ICD-10-CM | POA: Diagnosis not present

## 2018-04-30 MED ORDER — ISOSORBIDE DINITRATE 10 MG PO TABS: 20.0000 mg | ORAL_TABLET | Freq: Three times a day (TID) | ORAL | 3 refills | Status: DC

## 2018-04-30 MED ORDER — HYDRALAZINE HCL 25 MG PO TABS: 25.0000 mg | ORAL_TABLET | Freq: Three times a day (TID) | ORAL | 3 refills | Status: DC

## 2018-04-30 NOTE — Patient Instructions (Signed)
Medication Instructions:  Your physician has recommended you make the following change in your medication:   INCREASE: hydralazine to 25mg  three times daily  INCREASE: isordil 20 mg three times daily   Labwork: None.  Testing/Procedures: None.  Follow-Up: Your physician recommends that you schedule a follow-up appointment in: 6 weeks.    Any Other Special Instructions Will Be Listed Below (If Applicable).     If you need a refill on your cardiac medications before your next appointment, please call your pharmacy.

## 2018-04-30 NOTE — Addendum Note (Signed)
Addended by: Lita Mains on: 04/30/2018 02:36 PM   Modules accepted: Orders

## 2018-04-30 NOTE — Progress Notes (Signed)
EKG completed for surgery 05/02/2018

## 2018-04-30 NOTE — Progress Notes (Signed)
EKG reviewed by Dr. Woodrum, will proceed with surgery as scheduled.  

## 2018-05-02 ENCOUNTER — Ambulatory Visit (HOSPITAL_BASED_OUTPATIENT_CLINIC_OR_DEPARTMENT_OTHER)
Admission: RE | Admit: 2018-05-02 | Discharge: 2018-05-02 | Disposition: A | Discharge status: Home / Self Care | Payer: Medicare HMO | Source: Ambulatory Visit | Attending: Orthopedic Surgery | Admitting: Orthopedic Surgery

## 2018-05-02 ENCOUNTER — Ambulatory Visit (HOSPITAL_BASED_OUTPATIENT_CLINIC_OR_DEPARTMENT_OTHER): Payer: Medicare HMO | Admitting: Anesthesiology

## 2018-05-02 ENCOUNTER — Other Ambulatory Visit: Payer: Self-pay

## 2018-05-02 ENCOUNTER — Encounter (HOSPITAL_BASED_OUTPATIENT_CLINIC_OR_DEPARTMENT_OTHER)
Admission: RE | Disposition: A | Discharge status: Home / Self Care | Payer: Self-pay | Source: Ambulatory Visit | Attending: Orthopedic Surgery

## 2018-05-02 ENCOUNTER — Encounter (HOSPITAL_BASED_OUTPATIENT_CLINIC_OR_DEPARTMENT_OTHER): Payer: Self-pay | Admitting: Anesthesiology

## 2018-05-02 ENCOUNTER — Telehealth: Payer: Self-pay | Admitting: *Deleted

## 2018-05-02 DIAGNOSIS — Z87891 Personal history of nicotine dependence: Secondary | ICD-10-CM | POA: Diagnosis not present

## 2018-05-02 DIAGNOSIS — E079 Disorder of thyroid, unspecified: Secondary | ICD-10-CM | POA: Insufficient documentation

## 2018-05-02 DIAGNOSIS — I10 Essential (primary) hypertension: Secondary | ICD-10-CM | POA: Diagnosis not present

## 2018-05-02 DIAGNOSIS — Z79899 Other long term (current) drug therapy: Secondary | ICD-10-CM | POA: Insufficient documentation

## 2018-05-02 DIAGNOSIS — E119 Type 2 diabetes mellitus without complications: Secondary | ICD-10-CM | POA: Insufficient documentation

## 2018-05-02 DIAGNOSIS — M65351 Trigger finger, right little finger: Secondary | ICD-10-CM | POA: Insufficient documentation

## 2018-05-02 DIAGNOSIS — N400 Enlarged prostate without lower urinary tract symptoms: Secondary | ICD-10-CM | POA: Diagnosis not present

## 2018-05-02 DIAGNOSIS — E785 Hyperlipidemia, unspecified: Secondary | ICD-10-CM | POA: Insufficient documentation

## 2018-05-02 DIAGNOSIS — K219 Gastro-esophageal reflux disease without esophagitis: Secondary | ICD-10-CM | POA: Diagnosis not present

## 2018-05-02 HISTORY — DX: Sleep apnea, unspecified: G47.30

## 2018-05-02 HISTORY — PX: TRIGGER FINGER RELEASE: SHX641

## 2018-05-02 HISTORY — DX: Other chronic pain: G89.29

## 2018-05-02 LAB — GLUCOSE, CAPILLARY
Glucose-Capillary: 145 mg/dL — ABNORMAL HIGH (ref 70–99)
Glucose-Capillary: 116 mg/dL — ABNORMAL HIGH (ref 70–99)

## 2018-05-02 SURGERY — RELEASE, A1 PULLEY, FOR TRIGGER FINGER
Anesthesia: Regional | Site: Finger | Laterality: Right

## 2018-05-02 MED ORDER — DEXAMETHASONE SODIUM PHOSPHATE 10 MG/ML IJ SOLN
INTRAMUSCULAR | Status: AC
  Filled 2018-05-02: qty 1

## 2018-05-02 MED ORDER — DEXAMETHASONE SODIUM PHOSPHATE 10 MG/ML IJ SOLN
INTRAMUSCULAR | Status: DC | PRN
  Administered 2018-05-02: 5 mg via INTRAVENOUS

## 2018-05-02 MED ORDER — LIDOCAINE HCL (CARDIAC) PF 100 MG/5ML IV SOSY
PREFILLED_SYRINGE | INTRAVENOUS | Status: DC | PRN
  Administered 2018-05-02: 20 mg via INTRAVENOUS

## 2018-05-02 MED ORDER — ONDANSETRON HCL 4 MG/2ML IJ SOLN
INTRAMUSCULAR | Status: AC
  Filled 2018-05-02: qty 2

## 2018-05-02 MED ORDER — CHLORHEXIDINE GLUCONATE 4 % EX LIQD: 60.0000 mL | Freq: Once | CUTANEOUS | Status: DC

## 2018-05-02 MED ORDER — FENTANYL CITRATE (PF) 100 MCG/2ML IJ SOLN
50.0000 ug | INTRAMUSCULAR | Status: DC | PRN
  Administered 2018-05-02 (×2): 50 ug via INTRAVENOUS

## 2018-05-02 MED ORDER — HYDROCODONE-ACETAMINOPHEN 5-325 MG PO TABS: 1.0000 | ORAL_TABLET | Freq: Four times a day (QID) | ORAL | 0 refills | Status: DC | PRN

## 2018-05-02 MED ORDER — ONDANSETRON HCL 4 MG/2ML IJ SOLN
INTRAMUSCULAR | Status: DC | PRN
  Administered 2018-05-02: 4 mg via INTRAVENOUS

## 2018-05-02 MED ORDER — LACTATED RINGERS IV SOLN
INTRAVENOUS | Status: DC
  Administered 2018-05-02: 12:00:00 via INTRAVENOUS

## 2018-05-02 MED ORDER — CEFAZOLIN SODIUM-DEXTROSE 2-4 GM/100ML-% IV SOLN
2.0000 g | INTRAVENOUS | Status: AC
  Administered 2018-05-02: 2 g via INTRAVENOUS

## 2018-05-02 MED ORDER — MIDAZOLAM HCL 2 MG/2ML IJ SOLN
1.0000 mg | INTRAMUSCULAR | Status: DC | PRN
  Administered 2018-05-02: .5 mg via INTRAVENOUS

## 2018-05-02 MED ORDER — CEFAZOLIN SODIUM-DEXTROSE 2-4 GM/100ML-% IV SOLN
INTRAVENOUS | Status: AC
  Filled 2018-05-02: qty 100

## 2018-05-02 MED ORDER — SCOPOLAMINE 1 MG/3DAYS TD PT72: 1.0000 | MEDICATED_PATCH | Freq: Once | TRANSDERMAL | Status: DC | PRN

## 2018-05-02 MED ORDER — FENTANYL CITRATE (PF) 100 MCG/2ML IJ SOLN
INTRAMUSCULAR | Status: AC
  Filled 2018-05-02: qty 2

## 2018-05-02 MED ORDER — LIDOCAINE 2% (20 MG/ML) 5 ML SYRINGE
INTRAMUSCULAR | Status: AC
  Filled 2018-05-02: qty 5

## 2018-05-02 MED ORDER — MIDAZOLAM HCL 2 MG/2ML IJ SOLN
INTRAMUSCULAR | Status: AC
  Filled 2018-05-02: qty 2

## 2018-05-02 MED ORDER — LIDOCAINE HCL (PF) 0.5 % IJ SOLN
INTRAMUSCULAR | Status: DC | PRN
  Administered 2018-05-02: 35 mL via INTRAVENOUS

## 2018-05-02 MED ORDER — PROPOFOL 10 MG/ML IV BOLUS
INTRAVENOUS | Status: DC | PRN
  Administered 2018-05-02 (×2): 20 mg via INTRAVENOUS

## 2018-05-02 MED ORDER — HYDRALAZINE HCL 25 MG PO TABS: 25.0000 mg | ORAL_TABLET | Freq: Three times a day (TID) | ORAL | 3 refills | Status: DC

## 2018-05-02 MED ORDER — ISOSORBIDE DINITRATE 20 MG PO TABS: 20.0000 mg | ORAL_TABLET | Freq: Three times a day (TID) | ORAL | 3 refills | Status: DC

## 2018-05-02 SURGICAL SUPPLY — 30 items
BANDAGE COBAN STERILE 2 (GAUZE/BANDAGES/DRESSINGS) ×2 IMPLANT
BLADE SURG 15 STRL LF DISP TIS (BLADE) ×2 IMPLANT
BLADE SURG 15 STRL SS (BLADE) ×4
BNDG CMPR 9X4 STRL LF SNTH (GAUZE/BANDAGES/DRESSINGS)
BNDG ESMARK 4X9 LF (GAUZE/BANDAGES/DRESSINGS) IMPLANT
CHLORAPREP W/TINT 26ML (MISCELLANEOUS) ×2 IMPLANT
CORD BIPOLAR FORCEPS 12FT (ELECTRODE) ×2 IMPLANT
COVER BACK TABLE 60X90IN (DRAPES) ×2 IMPLANT
COVER MAYO STAND STRL (DRAPES) ×2 IMPLANT
CUFF TOURNIQUET SINGLE 18IN (TOURNIQUET CUFF) ×2 IMPLANT
DRAPE EXTREMITY T 121X128X90 (DRAPE) ×2 IMPLANT
DRAPE SURG 17X23 STRL (DRAPES) ×2 IMPLANT
GAUZE SPONGE 4X4 12PLY STRL (GAUZE/BANDAGES/DRESSINGS) ×2 IMPLANT
GAUZE XEROFORM 1X8 LF (GAUZE/BANDAGES/DRESSINGS) ×2 IMPLANT
GLOVE BIO SURGEON STRL SZ7.5 (GLOVE) ×2 IMPLANT
GLOVE BIOGEL PI IND STRL 8 (GLOVE) ×1 IMPLANT
GLOVE BIOGEL PI INDICATOR 8 (GLOVE) ×1
GOWN STRL REUS W/ TWL LRG LVL3 (GOWN DISPOSABLE) ×1 IMPLANT
GOWN STRL REUS W/TWL LRG LVL3 (GOWN DISPOSABLE) ×2
GOWN STRL REUS W/TWL XL LVL3 (GOWN DISPOSABLE) ×2 IMPLANT
NDL HYPO 25X1 1.5 SAFETY (NEEDLE) ×1 IMPLANT
NEEDLE HYPO 25X1 1.5 SAFETY (NEEDLE) ×2 IMPLANT
NS IRRIG 1000ML POUR BTL (IV SOLUTION) ×2 IMPLANT
PACK BASIN DAY SURGERY FS (CUSTOM PROCEDURE TRAY) ×2 IMPLANT
STOCKINETTE 4X48 STRL (DRAPES) ×2 IMPLANT
SUT ETHILON 4 0 PS 2 18 (SUTURE) ×2 IMPLANT
SYR BULB 3OZ (MISCELLANEOUS) ×2 IMPLANT
SYR CONTROL 10ML LL (SYRINGE) ×2 IMPLANT
TOWEL GREEN STERILE FF (TOWEL DISPOSABLE) ×4 IMPLANT
UNDERPAD 30X30 (UNDERPADS AND DIAPERS) ×2 IMPLANT

## 2018-05-02 NOTE — Op Note (Addendum)
05/02/2018 [facility]  Operative Note  PREOPERATIVE DIAGNOSIS: right small finger trigger  M65.351  POSTOPERATIVE DIAGNOSIS:  right small finger trigger  M65.351  PROCEDURE: Procedure(s): RELEASE TRIGGER FINGER/A-1 PULLEY RIGHT SMALL FINGER  SURGEON:  Leanora Cover, MD  ASSISTANT:  none.  ANESTHESIA:  Bier block with sedation.  IV FLUIDS:  Per anesthesia flow sheet.  ESTIMATED BLOOD LOSS:  Minimal.  COMPLICATIONS:  None.  SPECIMENS:  None.  TOURNIQUET TIME:  Total Tourniquet Time Documented: Forearm (Right) - 14 minutes Total: Forearm (Right) - 14 minutes   DISPOSITION:  Stable to PACU.  LOCATION: [facility]  INDICATIONS: James Goheen. is a 72 y.o. male with right small finger trigger digit.  Injected with relief, but returned.  He wishes to have a trigger release.  Risks, benefits and alternatives of surgery were discussed including the risk of blood loss, infection, damage to nerves, vessels, tendons, ligaments, bone, failure of surgery, need for additional surgery, complications with wound healing, continued pain, continued triggering and need for repeat surgery.  He voiced understanding of these risks and elected to proceed.  OPERATIVE COURSE:  After being identified preoperatively by myself, the patient and I agreed upon the procedure and site of procedure.  The surgical site was marked. The risks, benefits, and alternatives of surgery were reviewed and he wished to proceed.  Surgical consent had been signed. He was given IV Ancef as preoperative antibiotic prophylaxis. He was transported to the operating room and placed on the operating room table in supine position with the Right upper extremity on an arm board. Bier block anesthesia was induced by the anesthesiologist.  The Right upper extremity was prepped and draped in normal sterile orthopedic fashion. A surgical pause was performed between surgeons, anesthesia, and operating room  staff, and all were in agreement as to the patient, procedure, and site of procedure.  Tourniquet at the proximal aspect of the forearm had been inflated for the Bier block.  An incision was made at the volar aspect of the MP joint of the little finger.  This was carried into the subcutaneous tissues by preading technique.  Bipolar electrocautery was used to obtain hemostasis.  The radial and ulnar digital nerves were protected throughout the case. The flexor sheath was identified.  The A1 pulley was identified and sharply incised.  It was released in its entirety.  The proximal 1-2 mm of the A2 pulley was vented to allow better excursion of the tendons.  The finger was placed through a range of motion and there was noted to be no catching.  The tendons were brought through the wound and any adherences released.  The wound was then copiously irrigated with sterile saline. It was closed with 4-0 nylon in a horizontal mattress fashion.  It was injected with 0.25% plain Marcaine to aid in postoperative analgesia.  It was dressed with sterile Xeroform, 4x4s, and wrapped lightly with a Coban dressing.  Tourniquet was deflated at 14 minutes.  The fingertips were pink with brisk capillary refill after deflation of the tourniquet.  The operative drapes were broken down and the patient was awoken from anesthesia safely.  He was transferred back to the stretcher and taken to the PACU in stable condition.   I will see him back in the office in 1 week for postoperative followup.  I will give him a prescription for Norco 5/325 1 tab PO q6 hours prn pain, dispense # 15.    Leanora Cover,  MD Electronically signed, 05/02/18

## 2018-05-02 NOTE — Telephone Encounter (Signed)
Contacted patient who needed medication refills sent to the [facility] per [facility], RN after office visit on Tuesday, 04/30/18 to get the fax number. Patient states he does not have this information but his daughter, Albin Felling, does. Attempted to contact Carla per DPR with no answer, left her a message to return call.

## 2018-05-02 NOTE — Telephone Encounter (Signed)
Albin Felling returned call and provided needed information. Information was successfully faxed to [phone] to attention Dr. Selinda Michaels along with office notes as requested.

## 2018-05-02 NOTE — Transfer of Care (Signed)
Immediate Anesthesia Transfer of Care Note  Patient: James [name].  Procedure(s) Performed: RELEASE TRIGGER FINGER/A-1 PULLEY RIGHT SMALL FINGER (Right Finger)  Patient Location: PACU  Anesthesia Type:MAC and Bier block  Level of Consciousness: awake, alert  and oriented  Airway & Oxygen Therapy: Patient Spontanous Breathing  Post-op Assessment: Report given to RN and Post -op Vital signs reviewed and stable  Post vital signs: Reviewed and stable  Last Vitals:  Vitals Value Taken Time  BP    Temp    Pulse    Resp 12 05/02/2018 12:57 PM  SpO2    Vitals shown include unvalidated device data.  Last Pain:  Vitals:   05/02/18 1105  TempSrc: Oral  PainSc: 7       Patients Stated Pain Goal: 5 (62/22/97 [id number])  Complications: No apparent anesthesia complications

## 2018-05-02 NOTE — H&P (Signed)
  James [name]. is an 72 y.o. male.   Chief Complaint: right small finger trigger HPI: 72 yo male with triggering right small finger.  Injections helpful but not lasting.  He wishes to have trigger digit release.  Allergies:  Allergies  Allergen Reactions  . Invokana [Canagliflozin] Anaphylaxis    Facial and lip swelling    Past Medical History:  Diagnosis Date  . Anemia   . Arthritis   . BPH (benign prostatic hyperplasia)   . Chronic pain   . Diabetes mellitus   . GERD (gastroesophageal reflux disease)   . Headache(784.0)    migraines, sinus headaches  . Hepatitis    C and B-treated  . HLD (hyperlipidemia)   . Hypertension   . Pneumonia 07/2011  . Sleep apnea 03/2018   going for fitting on 04-29-18  . Thyroid disease    goiter  . Urinary frequency     Past Surgical History:  Procedure Laterality Date  . ABDOMINAL SURGERY    . BACK SURGERY     x 5   . CHOLECYSTECTOMY    . COLONOSCOPY    . cyst removal skull  20 years ago  . ETHMOIDECTOMY  10/12/2011   Procedure: ETHMOIDECTOMY;  Surgeon: Thornell Sartorius, MD;  Location: [facility] OR;  Service: ENT;  Laterality: Bilateral;  bilateral maxillary sinus osteal enlargement, frontal sinusotomy  . EYE SURGERY     bilat cataract with lens implants  . HAND SURGERY     right finger  . HERNIA REPAIR    . ROTATOR CUFF REPAIR     bilateral  . SHOULDER ARTHROSCOPY WITH ROTATOR CUFF REPAIR Left 05/18/2014   Procedure: SHOULDER ARTHROSCOPY WITH ROTATOR CUFF REPAIR;  Surgeon: Nita Sells, MD;  Location: [facility];  Service: Orthopedics;  Laterality: Left;  Left shoulder arthroscopy rotator cuff repair, subacromial decompression  . sinus surgery    . TONSILLECTOMY      Family History: Family History  Problem Relation Age of Onset  . Hyperlipidemia Mother   . Hypertension Mother     Social History:   reports that he quit smoking about 33 years ago. He has never used smokeless tobacco. He reports  that he does not drink alcohol or use drugs.  Medications: No medications prior to admission.    No results found for this or any previous visit (from the past 48 hour(s)).  No results found.   A comprehensive review of systems was negative.  Height 6' (1.829 m), weight 97.5 kg.  General appearance: alert, cooperative and appears stated age Head: Normocephalic, without obvious abnormality, atraumatic Neck: supple, symmetrical, trachea midline Cardio: regular rate and rhythm Resp: clear to auscultation bilaterally Extremities: Intact sensation and capillary refill all digits.  +epl/fpl/io.  No wounds.  Pulses: 2+ and symmetric Skin: Skin color, texture, turgor normal. No rashes or lesions Neurologic: Grossly normal Incision/Wound: none  Assessment/Plan Right small finger trigger digit.  Non operative and operative treatment options were discussed with the patient and patient wishes to proceed with operative treatment. Risks, benefits, and alternatives of surgery were discussed and the patient agrees with the plan of care.   Leanora Cover 05/02/2018, 9:57 AM

## 2018-05-02 NOTE — Anesthesia Postprocedure Evaluation (Signed)
Anesthesia Post Note  Patient: James [name].  Procedure(s) Performed: RELEASE TRIGGER FINGER/A-1 PULLEY RIGHT SMALL FINGER (Right Finger)     Patient location during evaluation: PACU Anesthesia Type: Bier Block Level of consciousness: awake and alert Pain management: pain level controlled Vital Signs Assessment: post-procedure vital signs reviewed and stable Respiratory status: spontaneous breathing, nonlabored ventilation, respiratory function stable and patient connected to nasal cannula oxygen Cardiovascular status: stable and blood pressure returned to baseline Postop Assessment: no apparent nausea or vomiting Anesthetic complications: no    Last Vitals:  Vitals:   05/02/18 1315 05/02/18 1335  BP: 135/75 (!) 149/75  Pulse: 76 75  Resp: 12 18  Temp:  36.6 C  SpO2: 96% 99%    Last Pain:  Vitals:   05/02/18 1335  TempSrc:   PainSc: 0-No pain                 Lidia Collum

## 2018-05-02 NOTE — Discharge Instructions (Addendum)

## 2018-05-02 NOTE — Anesthesia Preprocedure Evaluation (Signed)
Anesthesia Evaluation    History of Anesthesia Complications Negative for: history of anesthetic complications  Airway Mallampati: II  TM Distance: >3 FB Neck ROM: Full    Dental  (+) Missing, Partial Lower   Pulmonary sleep apnea , former smoker,    Pulmonary exam normal        Cardiovascular hypertension, Normal cardiovascular exam     Neuro/Psych negative neurological ROS  negative psych ROS   GI/Hepatic GERD  ,(+) Hepatitis -, B, C  Endo/Other  diabetes  Renal/GU Renal InsufficiencyRenal disease  negative genitourinary   Musculoskeletal negative musculoskeletal ROS (+)   Abdominal   Peds  Hematology Multiple myeloma   Anesthesia Other Findings   Reproductive/Obstetrics                           Anesthesia Physical Anesthesia Plan  ASA: III  Anesthesia Plan: Bier Block and Bier Block-LIDOCAINE ONLY   Post-op Pain Management:    Induction:   PONV Risk Score and Plan: 1 and Propofol infusion  Airway Management Planned: Natural Airway, Nasal Cannula and Simple Face Mask  Additional Equipment: None  Intra-op Plan:   Post-operative Plan:   Informed Consent: I have reviewed the patients History and Physical, chart, labs and discussed the procedure including the risks, benefits and alternatives for the proposed anesthesia with the patient or authorized representative who has indicated his/her understanding and acceptance.     Plan Discussed with:   Anesthesia Plan Comments:        Anesthesia Quick Evaluation

## 2018-05-02 NOTE — Anesthesia Procedure Notes (Signed)
Procedure Name: MAC Performed by: Terrance Mass, CRNA Pre-anesthesia Checklist: Patient identified, Emergency Drugs available, Suction available, Timeout performed and Patient being monitored Patient Re-evaluated:Patient Re-evaluated prior to induction Oxygen Delivery Method: Simple face mask

## 2018-05-02 NOTE — Anesthesia Procedure Notes (Signed)
Anesthesia Regional Block: Bier block (IV Regional)   Pre-Anesthetic Checklist: ,, timeout performed, Correct Patient, Correct Site, Correct Laterality, Correct Procedure,, site marked, surgical consent,, at surgeon's request  Laterality: Right     Needles:  Injection technique: Single-shot  Needle Type: Other      Needle Gauge: 20     Additional Needles:   Procedures:,,,,, intact distal pulses, Esmarch exsanguination, single tourniquet utilized,  Narrative:   Performed by: Personally       

## 2018-05-03 ENCOUNTER — Encounter (HOSPITAL_BASED_OUTPATIENT_CLINIC_OR_DEPARTMENT_OTHER): Payer: Self-pay | Admitting: Orthopedic Surgery

## 2018-06-11 ENCOUNTER — Ambulatory Visit (INDEPENDENT_AMBULATORY_CARE_PROVIDER_SITE_OTHER): Payer: Medicare Other | Admitting: Cardiology

## 2018-06-11 VITALS — BP 138/84 | HR 72 | Ht 64.0 in | Wt 236.0 lb

## 2018-06-11 DIAGNOSIS — I5042 Chronic combined systolic (congestive) and diastolic (congestive) heart failure: Secondary | ICD-10-CM

## 2018-06-11 DIAGNOSIS — N183 Chronic kidney disease, stage 3 unspecified: Secondary | ICD-10-CM

## 2018-06-11 DIAGNOSIS — I4821 Permanent atrial fibrillation: Secondary | ICD-10-CM | POA: Diagnosis not present

## 2018-06-11 DIAGNOSIS — R350 Frequency of micturition: Secondary | ICD-10-CM | POA: Diagnosis not present

## 2018-06-11 DIAGNOSIS — I130 Hypertensive heart and chronic kidney disease with heart failure and stage 1 through stage 4 chronic kidney disease, or unspecified chronic kidney disease: Secondary | ICD-10-CM | POA: Diagnosis not present

## 2018-06-11 MED ORDER — ISOSORBIDE DINITRATE 40 MG PO TABS: 40.0000 mg | ORAL_TABLET | Freq: Three times a day (TID) | ORAL | 3 refills | Status: DC

## 2018-06-11 MED ORDER — POTASSIUM CHLORIDE ER 10 MEQ PO TBCR: 10.0000 meq | EXTENDED_RELEASE_TABLET | Freq: Every day | ORAL | 0 refills | Status: AC

## 2018-06-11 MED ORDER — HYDRALAZINE HCL 25 MG PO TABS: 37.5000 mg | ORAL_TABLET | Freq: Three times a day (TID) | ORAL | 3 refills | Status: DC

## 2018-06-11 MED ORDER — HYDRALAZINE HCL 25 MG PO TABS: 37.5000 mg | ORAL_TABLET | Freq: Three times a day (TID) | ORAL | 0 refills | Status: DC

## 2018-06-11 MED ORDER — ISOSORBIDE DINITRATE 40 MG PO TABS: 40.0000 mg | ORAL_TABLET | Freq: Three times a day (TID) | ORAL | 0 refills | Status: DC

## 2018-06-11 MED ORDER — TAMSULOSIN HCL 0.4 MG PO CAPS: 0.4000 mg | ORAL_CAPSULE | Freq: Two times a day (BID) | ORAL | 0 refills | Status: AC

## 2018-06-11 MED ORDER — TAMSULOSIN HCL 0.4 MG PO CAPS: 0.4000 mg | ORAL_CAPSULE | Freq: Two times a day (BID) | ORAL | 3 refills | Status: DC

## 2018-06-11 NOTE — Patient Instructions (Addendum)
Medication Instructions:  Your physician has recommended you make the following change in your medication:   INCREASE hydralazine 25 mg: Take 1.5 tablets (37.5 mg) three times daily INCREASE isosorbide dinitrate (isordil) 40 mg: Take 1 tablet three times daily  START tamsulosin (flomax) 0.4 mg: Take 1 capsule twice daily   If you need a refill on your cardiac medications before your next appointment, please call your pharmacy.   Lab work: Your physician recommends that you return for lab work today: BMP, ProBNP.   If you have labs (blood work) drawn today and your tests are completely normal, you will receive your results only by: Marland Kitchen MyChart Message (if you have MyChart) OR . A paper copy in the mail If you have any lab test that is abnormal or we need to change your treatment, we will call you to review the results.  Testing/Procedures: None  Follow-Up: At [facility], you and your health needs are our priority.  As part of our continuing mission to provide you with exceptional heart care, we have created designated Provider Care Teams.  These Care Teams include your primary Cardiologist (physician) and Advanced Practice Providers (APPs -  Physician Assistants and Nurse Practitioners) who all work together to provide you with the care you need, when you need it. . Your physician recommends that you schedule a follow-up appointment in 3 months.     Tamsulosin capsules What is this medicine? TAMSULOSIN (tam SOO loe sin) is used to treat enlargement of the prostate gland in men, a condition called benign prostatic hyperplasia or BPH. It is not for use in women. It works by relaxing muscles in the prostate and bladder neck. This improves urine flow and reduces BPH symptoms. This medicine may be used for other purposes; ask your health care provider or pharmacist if you have questions. COMMON BRAND NAME(S): Flomax What should I tell my health care provider before I take this  medicine? They need to know if you have any of the following conditions: -advanced kidney disease -advanced liver disease -low blood pressure -prostate cancer -an unusual or allergic reaction to tamsulosin, sulfa drugs, other medicines, foods, dyes, or preservatives -pregnant or trying to get pregnant -breast-feeding How should I use this medicine? Take this medicine by mouth about 30 minutes after the same meal every day. Follow the directions on the prescription label. Swallow the capsules whole with a glass of water. Do not crush, chew, or open capsules. Do not take your medicine more often than directed. Do not stop taking your medicine unless your doctor tells you to. Talk to your pediatrician regarding the use of this medicine in children. Special care may be needed. Overdosage: If you think you have taken too much of this medicine contact a poison control center or emergency room at once. NOTE: This medicine is only for you. Do not share this medicine with others. What if I miss a dose? If you miss a dose, take it as soon as you can. If it is almost time for your next dose, take only that dose. Do not take double or extra doses. If you stop taking your medicine for several days or more, ask your doctor or health care professional what dose you should start back on. What may interact with this medicine? -cimetidine -fluoxetine -ketoconazole -medicines for erectile disfunction like sildenafil, tadalafil, vardenafil -medicines for high blood pressure -other alpha-blockers like alfuzosin, doxazosin, phentolamine, phenoxybenzamine, prazosin, terazosin -warfarin This list may not describe all possible interactions. Give your  health care provider a list of all the medicines, herbs, non-prescription drugs, or dietary supplements you use. Also tell them if you smoke, drink alcohol, or use illegal drugs. Some items may interact with your medicine. What should I watch for while using this  medicine? Visit your doctor or health care professional for regular check ups. You will need lab work done before you start this medicine and regularly while you are taking it. Check your blood pressure as directed. Ask your health care professional what your blood pressure should be, and when you should contact him or her. This medicine may make you feel dizzy or lightheaded. This is more likely to happen after the first dose, after an increase in dose, or during hot weather or exercise. Drinking alcohol and taking some medicines can make this worse. Do not drive, use machinery, or do anything that needs mental alertness until you know how this medicine affects you. Do not sit or stand up quickly. If you begin to feel dizzy, sit down until you feel better. These effects can decrease once your body adjusts to the medicine. Contact your doctor or health care professional right away if you have an erection that lasts longer than 4 hours or if it becomes painful. This may be a sign of a serious problem and must be treated right away to prevent permanent damage. If you are thinking of having cataract surgery, tell your eye surgeon that you have taken this medicine. What side effects may I notice from receiving this medicine? Side effects that you should report to your doctor or health care professional as soon as possible: -allergic reactions like skin rash or itching, hives, swelling of the lips, mouth, tongue, or throat -breathing problems -change in vision -feeling faint or lightheaded -irregular heartbeat -prolonged or painful erection -weakness Side effects that usually do not require medical attention (report to your doctor or health care professional if they continue or are bothersome): -back pain -change in sex drive or performance -constipation, nausea or vomiting -cough -drowsy -runny or stuffy nose -trouble sleeping This list may not describe all possible side effects. Call your doctor  for medical advice about side effects. You may report side effects to FDA at 1-800-FDA-1088. Where should I keep my medicine? Keep out of the reach of children. Store at room temperature between 15 and 30 degrees C (59 and 86 degrees F). Throw away any unused medicine after the expiration date. NOTE: This sheet is a summary. It may not cover all possible information. If you have questions about this medicine, talk to your doctor, pharmacist, or health care provider.  2018 Elsevier/Gold Standard (2012-07-17 14:11:34)

## 2018-06-11 NOTE — Progress Notes (Signed)
Cardiology Office Note:    Date:  06/11/2018   ID:  Kevin Lang, DOB 03-Oct-1945, MRN [id number]  PCP:  Cathlyn Parsons, PA-C  Cardiologist:  Shirlee More, MD    Referring MD: Cathlyn Parsons, PA-C    ASSESSMENT:    1. Chronic combined systolic and diastolic heart failure ([place])   2. Heart & renal disease, hypertensive, with heart fail/chron kidney dis ([place])   3. Permanent atrial fibrillation   4. CKD (chronic kidney disease) stage 3, GFR 30-59 ml/min (HCC)   5. Urine frequency    PLAN:    In order of problems listed above:  1. His heart failure is improved he is compensated he has no fluid overload New York Heart Association class II we will uptitrate his hydralazine oral nitrates continue loop diuretics Spironolactone and beta-blocker and he has declined participation investigational drug trials. 2. Recheck renal function 3. Stable rate controlled continue beta-blocker and anticoagulant 4. Recheck renal function so far he has tolerated his guideline directed therapy 5. Previously took an alpha-blocker place him on Flomax 0.6 mg twice daily if unimproved will need to see urology   Next appointment: 3 months   Medication Adjustments/Labs and Tests Ordered: Current medicines are reviewed at length with the patient today.  Concerns regarding medicines are outlined above.  Orders Placed This Encounter  Procedures  . Basic Metabolic Panel (BMET)  . Pro b natriuretic peptide (BNP)   Meds ordered this encounter  Medications  . DISCONTD: tamsulosin (FLOMAX) 0.4 MG CAPS capsule    Sig: Take 1 capsule (0.4 mg total) by mouth 2 (two) times daily.    Dispense:  60 capsule    Refill:  3  . DISCONTD: hydrALAZINE (APRESOLINE) 25 MG tablet    Sig: Take 1.5 tablets (37.5 mg total) by mouth 3 (three) times daily.    Dispense:  135 tablet    Refill:  3  . DISCONTD: isosorbide dinitrate (ISORDIL) 40 MG tablet    Sig: Take 1 tablet (40 mg total) by mouth 3 (three) times daily.   Dispense:  90 tablet    Refill:  3  . tamsulosin (FLOMAX) 0.4 MG CAPS capsule    Sig: Take 1 capsule (0.4 mg total) by mouth 2 (two) times daily.    Dispense:  180 capsule    Refill:  0  . potassium chloride (K-DUR) 10 MEQ tablet    Sig: Take 1 tablet (10 mEq total) by mouth daily.    Dispense:  90 tablet    Refill:  0  . isosorbide dinitrate (ISORDIL) 40 MG tablet    Sig: Take 1 tablet (40 mg total) by mouth 3 (three) times daily.    Dispense:  90 tablet    Refill:  0  . hydrALAZINE (APRESOLINE) 25 MG tablet    Sig: Take 1.5 tablets (37.5 mg total) by mouth 3 (three) times daily.    Dispense:  405 tablet    Refill:  0    Chief Complaint  Patient presents with  . Congestive Heart Failure    History of Present Illness:    Kevin Lang is a 72 y.o. 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 He was started on Entresto and developed angioedema and stopped.  He was last seen by me  04/02/2018.  He was placed on vasodilator therapy with hydralazine and isosorbide and decision was made to do outpatient follow-up echocardiogram and if ejection fraction remains less than 35% consider the merits of ICD therapy. Compliance with diet, lifestyle and medications: yes Past Medical History:  Diagnosis Date  . Aneurysm of thoracic aorta ([place]) 02/16/2017   Overview:  Cardiac cath 12/08/15: Conclusions Diagnostic Procedure Summary 1. Mild non-obstructive CAD 2. Normal LV systolic function 3. Aortogram performed to eval aortic root aneurysm, 4.0 cm by this study.  CT Jan 2017, ascending aaorta aneurysm, 4.8 cm IMOUPDATE  . Benign essential hypertension 02/16/2017  . Cardiomyopathy ([place]) 08/23/2015  . Chronic anticoagulation 05/21/2016  . Chronic combined systolic and  diastolic congestive heart failure ([place]) 02/16/2017  . CKD (chronic kidney disease) stage 3, GFR 30-59 ml/min (HCC) 12/31/2015  . COPD (chronic obstructive pulmonary disease) ([place]) 05/21/2016  . Mild CAD 05/21/2016   Overview:  Cardiac cath 12/08/15:Conclusions Diagnostic Procedure Summary 1. Mild non-obstructive CAD 2. Normal LV systolic function 3. Aortogram performed to eval aortic root aneurysm, 4.0 cm by this study. Diagnostic Procedure Recommendations  . Persistent atrial fibrillation 02/16/2017    Past Surgical History:  Procedure Laterality Date  . CORONARY ANGIOPLASTY      Current Medications: Current Meds  Medication Sig  . apixaban (ELIQUIS) 5 MG TABS tablet Take 1 tablet (5 mg total) by mouth 2 (two) times daily.  Marland Kitchen atorvastatin (LIPITOR) 40 MG tablet Take 1 tablet (40 mg total) by mouth daily.  . cromolyn (NASALCROM) 5.2 MG/ACT nasal spray Place 1 spray into both nostrils as needed.   . hydrALAZINE (APRESOLINE) 25 MG tablet Take 1.5 tablets (37.5 mg total) by mouth 3 (three) times daily.  . isosorbide dinitrate (ISORDIL) 40 MG tablet Take 1 tablet (40 mg total) by mouth 3 (three) times daily.  . metoprolol tartrate (LOPRESSOR) 100 MG tablet Take 1 tablet (100 mg total) by mouth 2 (two) times daily.  . nitroGLYCERIN (NITROSTAT) 0.4 MG SL tablet Place 0.4 mg under the tongue every 5 (five) hours as needed.  . potassium chloride (K-DUR) 10 MEQ tablet Take 1 tablet (10 mEq total) by mouth daily.  Marland Kitchen spironolactone (ALDACTONE) 25 MG tablet Take 1 tablet (25 mg total) by mouth daily.  Marland Kitchen torsemide (DEMADEX) 20 MG tablet Take 1 tablet (20 mg total) by mouth 2 (two) times daily.  . [DISCONTINUED] hydrALAZINE (APRESOLINE) 25 MG tablet Take 1 tablet (25 mg total) by mouth 3 (three) times daily.  . [DISCONTINUED] hydrALAZINE (APRESOLINE) 25 MG tablet Take 1.5 tablets (37.5 mg total) by mouth 3 (three) times daily.  . [DISCONTINUED] isosorbide dinitrate (ISORDIL) 20 MG tablet Take 1 tablet (20  mg total) by mouth 3 (three) times daily.  . [DISCONTINUED] isosorbide dinitrate (ISORDIL) 40 MG tablet Take 1 tablet (40 mg total) by mouth 3 (three) times daily.  . [DISCONTINUED] potassium chloride (K-DUR) 10 MEQ tablet Take 1 tablet (10 mEq total) by mouth daily.     Allergies:   Entresto [sacubitril-valsartan]   Social History   Socioeconomic History  . Marital status: Married    Spouse name: Not on file  . Number of children: Not on file  . Years of education: Not on file  . Highest education level: Not on file  Occupational History  . Not on file  Social Needs  . Financial resource strain: Not on file  . Food insecurity:    Worry: Not on file    Inability: Not on file  .  Transportation needs:    Medical: Not on file    Non-medical: Not on file  Tobacco Use  . Smoking status: Former Smoker    Packs/day: 0.50    Years: 30.00    Pack years: 15.00    Types: Cigarettes    Last attempt to quit: 08/22/2000    Years since quitting: 17.8  . Smokeless tobacco: Never Used  Substance and Sexual Activity  . Alcohol use: No  . Drug use: No  . Sexual activity: Not on file  Lifestyle  . Physical activity:    Days per week: Not on file    Minutes per session: Not on file  . Stress: Not on file  Relationships  . Social connections:    Talks on phone: Not on file    Gets together: Not on file    Attends religious service: Not on file    Active member of club or organization: Not on file    Attends meetings of clubs or organizations: Not on file    Relationship status: Not on file  Other Topics Concern  . Not on file  Social History Narrative  . Not on file     Family History: The patient's family history includes Aneurysm in his father; Heart disease in his brother; Hypertension in his mother; Stroke in his mother. ROS:   Please see the history of present illness.    All other systems reviewed and are negative.  EKGs/Labs/Other Studies Reviewed:    The following  studies were reviewed today:  Recent Labs: 04/02/2018: BUN 27; Creatinine, Ser 1.73; NT-Pro BNP 3,692; Potassium 3.5; Sodium 145  Recent Lipid Panel   11/16/16 Chol 138 HDL 43 LDL 70 No results found for: CHOL, TRIG, HDL, CHOLHDL, VLDL, LDLCALC, LDLDIRECT  Physical Exam:    VS:  BP 138/84 (BP Location: Right Arm, Patient Position: Sitting, Cuff Size: Large)   Pulse 72   Ht 5' 4"  (1.626 m)   Wt 236 lb (107 kg)   SpO2 96%   BMI 40.51 kg/m     Wt Readings from Last 3 Encounters:  06/11/18 236 lb (107 kg)  04/30/18 233 lb 3.2 oz (105.8 kg)  04/02/18 231 lb 12.8 oz (105.1 kg)     GEN:  Well nourished, well developed in no acute distress HEENT: Normal NECK: No JVD; No carotid bruits LYMPHATICS: No lymphadenopathy CARDIAC: soft s1 RRR, no murmurs, rubs, gallops RESPIRATORY:  Clear to auscultation without rales, wheezing or rhonchi  ABDOMEN: Soft, non-tender, non-distended MUSCULOSKELETAL:  No edema; No deformity  SKIN: Warm and dry NEUROLOGIC:  Alert and oriented x 3 PSYCHIATRIC:  Normal affect    Signed, Shirlee More, MD  06/11/2018 3:28 PM    [facility]

## 2018-06-12 ENCOUNTER — Telehealth: Payer: Self-pay | Admitting: *Deleted

## 2018-06-12 ENCOUNTER — Telehealth: Payer: Self-pay | Admitting: Cardiology

## 2018-06-12 DIAGNOSIS — N183 Chronic kidney disease, stage 3 unspecified: Secondary | ICD-10-CM

## 2018-06-12 LAB — BASIC METABOLIC PANEL
BUN/Creatinine Ratio: 17 (ref 10–24)
BUN: 34 mg/dL — AB (ref 8–27)
CO2: 27 mmol/L (ref 20–29)
CREATININE: 2.01 mg/dL — AB (ref 0.76–1.27)
Calcium: 9.1 mg/dL (ref 8.6–10.2)
Chloride: 102 mmol/L (ref 96–106)
GFR calc Af Amer: 37 mL/min/{1.73_m2} — ABNORMAL LOW (ref >59)
GFR calc non Af Amer: 32 mL/min/{1.73_m2} — ABNORMAL LOW (ref >59)
GLUCOSE: 85 mg/dL (ref 65–99)
Potassium: 4.2 mmol/L (ref 3.5–5.2)
Sodium: 140 mmol/L (ref 134–144)

## 2018-06-12 LAB — PRO B NATRIURETIC PEPTIDE: NT-PRO BNP: 2216 pg/mL — AB (ref 0–376)

## 2018-06-12 NOTE — Telephone Encounter (Signed)
Patient's daughter, Albin Felling, per [facility] informed of lab results and advised that patient needs to return to our office to have a repeat BMP drawn in 1 month. Carla verbalized understanding. No further questions.

## 2018-06-12 NOTE — Telephone Encounter (Signed)
Patient informed of stable lab results and advised to return to our office for repeat lab work in 1 month, no appointment, no need to fast beforehand. Patient verbalized understanding. No further questions.

## 2018-06-12 NOTE — Telephone Encounter (Signed)
Please call patient's daughter with the results of his labwork. She states that he was confused about the findings and she woudl like to talk to you!

## 2018-06-12 NOTE — Telephone Encounter (Signed)
-----   Message from Baldo Daub, MD sent at 06/12/2018  6:34 AM EST ----- Stable  Recheck 1 month with CKD

## 2018-07-03 DIAGNOSIS — G894 Chronic pain syndrome: Secondary | ICD-10-CM | POA: Diagnosis not present

## 2018-07-03 DIAGNOSIS — M5136 Other intervertebral disc degeneration, lumbar region: Secondary | ICD-10-CM | POA: Diagnosis not present

## 2018-07-03 DIAGNOSIS — M5416 Radiculopathy, lumbar region: Secondary | ICD-10-CM | POA: Diagnosis not present

## 2018-07-03 DIAGNOSIS — C9001 Multiple myeloma in remission: Secondary | ICD-10-CM | POA: Diagnosis not present

## 2018-07-10 DIAGNOSIS — J322 Chronic ethmoidal sinusitis: Secondary | ICD-10-CM | POA: Diagnosis not present

## 2018-07-10 DIAGNOSIS — J342 Deviated nasal septum: Secondary | ICD-10-CM | POA: Diagnosis not present

## 2018-07-10 DIAGNOSIS — J370 Chronic laryngitis: Secondary | ICD-10-CM | POA: Diagnosis not present

## 2018-07-10 DIAGNOSIS — J410 Simple chronic bronchitis: Secondary | ICD-10-CM | POA: Diagnosis not present

## 2018-07-10 DIAGNOSIS — J320 Chronic maxillary sinusitis: Secondary | ICD-10-CM | POA: Diagnosis not present

## 2018-07-18 DIAGNOSIS — N183 Chronic kidney disease, stage 3 (moderate): Secondary | ICD-10-CM | POA: Diagnosis not present

## 2018-07-18 LAB — BASIC METABOLIC PANEL
BUN / CREAT RATIO: 15 (ref 10–24)
BUN: 28 mg/dL — ABNORMAL HIGH (ref 8–27)
CO2: 23 mmol/L (ref 20–29)
CREATININE: 1.85 mg/dL — AB (ref 0.76–1.27)
Calcium: 9.5 mg/dL (ref 8.6–10.2)
Chloride: 105 mmol/L (ref 96–106)
GFR calc Af Amer: 41 mL/min/{1.73_m2} — ABNORMAL LOW (ref >59)
GFR calc non Af Amer: 35 mL/min/{1.73_m2} — ABNORMAL LOW (ref >59)
GLUCOSE: 88 mg/dL (ref 65–99)
POTASSIUM: 3.9 mmol/L (ref 3.5–5.2)
SODIUM: 147 mmol/L — AB (ref 134–144)

## 2018-08-01 ENCOUNTER — Ambulatory Visit (HOSPITAL_COMMUNITY)
Admission: RE | Admit: 2018-08-01 | Discharge: 2018-08-01 | Disposition: A | Discharge status: Home / Self Care | Payer: Medicare HMO | Source: Ambulatory Visit | Attending: Oncology | Admitting: Oncology

## 2018-08-01 ENCOUNTER — Other Ambulatory Visit: Payer: Medicare HMO

## 2018-08-01 DIAGNOSIS — Z8579 Personal history of other malignant neoplasms of lymphoid, hematopoietic and related tissues: Secondary | ICD-10-CM | POA: Diagnosis not present

## 2018-08-01 DIAGNOSIS — C9001 Multiple myeloma in remission: Secondary | ICD-10-CM | POA: Insufficient documentation

## 2018-08-01 DIAGNOSIS — M79605 Pain in left leg: Secondary | ICD-10-CM | POA: Diagnosis not present

## 2018-08-05 ENCOUNTER — Telehealth: Payer: Self-pay | Admitting: Oncology

## 2018-08-05 NOTE — Telephone Encounter (Signed)
Called pt re appt being changed due to prostate clinic. Left vm with updated appt and callback number.

## 2018-08-06 DIAGNOSIS — M48062 Spinal stenosis, lumbar region with neurogenic claudication: Secondary | ICD-10-CM | POA: Diagnosis not present

## 2018-08-09 ENCOUNTER — Ambulatory Visit: Payer: Medicare HMO | Admitting: Oncology

## 2018-08-09 DIAGNOSIS — J040 Acute laryngitis: Secondary | ICD-10-CM | POA: Diagnosis not present

## 2018-08-12 DIAGNOSIS — E1129 Type 2 diabetes mellitus with other diabetic kidney complication: Secondary | ICD-10-CM | POA: Diagnosis not present

## 2018-08-12 DIAGNOSIS — E782 Mixed hyperlipidemia: Secondary | ICD-10-CM | POA: Diagnosis not present

## 2018-08-12 DIAGNOSIS — N400 Enlarged prostate without lower urinary tract symptoms: Secondary | ICD-10-CM | POA: Diagnosis not present

## 2018-08-12 DIAGNOSIS — G629 Polyneuropathy, unspecified: Secondary | ICD-10-CM | POA: Diagnosis not present

## 2018-08-12 DIAGNOSIS — I10 Essential (primary) hypertension: Secondary | ICD-10-CM | POA: Diagnosis not present

## 2018-08-12 DIAGNOSIS — E1149 Type 2 diabetes mellitus with other diabetic neurological complication: Secondary | ICD-10-CM | POA: Diagnosis not present

## 2018-08-12 DIAGNOSIS — K219 Gastro-esophageal reflux disease without esophagitis: Secondary | ICD-10-CM | POA: Diagnosis not present

## 2018-08-12 DIAGNOSIS — G8929 Other chronic pain: Secondary | ICD-10-CM | POA: Diagnosis not present

## 2018-08-12 DIAGNOSIS — R809 Proteinuria, unspecified: Secondary | ICD-10-CM | POA: Diagnosis not present

## 2018-08-12 DIAGNOSIS — C9000 Multiple myeloma not having achieved remission: Secondary | ICD-10-CM | POA: Diagnosis not present

## 2018-08-13 ENCOUNTER — Inpatient Hospital Stay: Payer: Medicare HMO | Attending: Oncology | Admitting: Oncology

## 2018-08-13 ENCOUNTER — Telehealth: Payer: Self-pay

## 2018-08-13 VITALS — BP 152/66 | HR 88 | Temp 98.5°F | Resp 18 | Ht 72.0 in | Wt 214.5 lb

## 2018-08-13 DIAGNOSIS — Z7984 Long term (current) use of oral hypoglycemic drugs: Secondary | ICD-10-CM

## 2018-08-13 DIAGNOSIS — C9001 Multiple myeloma in remission: Secondary | ICD-10-CM

## 2018-08-13 DIAGNOSIS — Z79899 Other long term (current) drug therapy: Secondary | ICD-10-CM

## 2018-08-13 DIAGNOSIS — D649 Anemia, unspecified: Secondary | ICD-10-CM

## 2018-08-13 NOTE — Telephone Encounter (Signed)
Printed avs sand calender of upcoming appointment. Per 1/14 los

## 2018-08-13 NOTE — Progress Notes (Signed)
Hematology and Oncology Follow Up Visit  James [name] [id number] September 18, 1945 73 y.o. 08/13/2018 12:34 PM [name], [name], MDEhinger, [name], MD   Principle Diagnosis: 73 year old man with IgA kappa multiple myeloma presented with plasma cell infiltration of 20% and IgA level of 3700 diagnosed in June 2017.    Prior therapy:  Revlimid and dexamethasone started on January 28 2016. Revlimid at 25 mg daily for 21 days with dexamethasone at 20 mg weekly. Therapy discontinued in January 2018 after achieving a complete response.  Current therapy: Active surveillance.   Interim History:  [name] returns today for a repeat evaluation.  Since last visit, he reports no major changes in his health.  He did sustain a rib fracture while he was reaching for an object and sustained minor trauma.  He does report some chest pain with inspiration but no difficulty breathing or cough.  He continues to eat well without any decline in ability to do so.  He denies recent falls or syncope.  Continues to have issues with chronic and hip pain which is unchanged.  He does not report any headaches, blurry vision, syncope or seizures.  He denies any alteration mental status or lethargy.  He does not report any fevers or chills or sweats.  Denies any chest pain, palpitation orthopnea.  Does not report any cough or hemoptysis or hematemesis.  He reports no nausea or vomiting or early satiety.  He denies any constipation or diarrhea.  He does not report any skin rashes or lesions.  He does not report any ecchymosis or petechiae.  He denies any anxiety or depression.  He denies any bleeding or clotting tendency.  The remaining review of systems is negative.    Medications: I have reviewed the patient's current medications.  Current Outpatient Medications  Medication Sig Dispense Refill  . amLODipine (NORVASC) 10 MG tablet Take 10 mg by mouth every morning.     . ferrous sulfate 325 (65 FE) MG tablet Take 325 mg by  mouth 2 (two) times daily with a meal.     . finasteride (PROSCAR) 5 MG tablet Take 5 mg by mouth daily.    Marland Kitchen gabapentin (NEURONTIN) 400 MG capsule Take 400 mg by mouth 3 (three) times daily.     Marland Kitchen glimepiride (AMARYL) 2 MG tablet Take 2 mg by mouth daily.    Marland Kitchen HYDROcodone-acetaminophen (NORCO) 10-325 MG tablet Take 1 tablet by mouth 3 (three) times daily.     Marland Kitchen HYDROcodone-acetaminophen (NORCO) 5-325 MG tablet Take 1 tablet by mouth every 6 (six) hours as needed. 1-2 tabs po q6 hours prn pain 15 tablet 0  . losartan (COZAAR) 50 MG tablet Take 50 mg by mouth every morning.     . metFORMIN (GLUCOPHAGE-XR) 500 MG 24 hr tablet Take 1,000 mg by mouth 2 (two) times daily.     Marland Kitchen omeprazole (PRILOSEC) 20 MG capsule Take 20 mg by mouth 2 (two) times daily before a meal.     . Tamsulosin HCl (FLOMAX) 0.4 MG CAPS Take 0.4 mg by mouth 2 (two) times daily.     . traMADol (ULTRAM) 50 MG tablet Take 50 mg by mouth 4 (four) times daily -  with meals and at bedtime. For pain     No current facility-administered medications for this visit.     Allergies:  Allergies  Allergen Reactions  . Invokana [Canagliflozin] Anaphylaxis    Facial and lip swelling    Past Medical History, Surgical history, Social history,  and Family History were reviewed and updated.   Physical Exam: Blood pressure (!) 152/66, pulse 88, temperature 98.5 F (36.9 C), temperature source Oral, resp. rate 18, height 6' (1.829 m), weight 214 lb 8 oz (97.3 kg), SpO2 99 %.    ECOG: 1   General appearance: Alert, awake without any distress. Head: Atraumatic without abnormalities Oropharynx: Without any thrush or ulcers. Eyes: No scleral icterus. Lymph nodes: No lymphadenopathy noted in the cervical, supraclavicular, or axillary nodes Heart:regular rate and rhythm, without any murmurs or gallops.   Lung: Clear to auscultation without any rhonchi, wheezes or dullness to percussion. Abdomin: Soft, nontender without any shifting  dullness or ascites. Musculoskeletal: No clubbing or cyanosis. Neurological: No motor or sensory deficits. Skin: No rashes or lesions.   Lab Results: Lab Results  Component Value Date   WBC 5.6 04/12/2018   HGB 12.1 (L) 04/12/2018   HCT 36.4 (L) 04/12/2018   MCV 89.9 04/12/2018   PLT 191 04/12/2018     Chemistry      Component Value Date/Time   NA 140 04/12/2018 1059   NA 138 06/26/2017 0946   K 3.8 04/12/2018 1059   K 3.8 06/26/2017 0946   CL 104 04/12/2018 1059   CL 105 08/09/2012 0918   CO2 25 04/12/2018 1059   CO2 25 06/26/2017 0946   BUN 11 04/12/2018 1059   BUN 16.4 06/26/2017 0946   CREATININE 1.35 (H) 04/12/2018 1059   CREATININE 1.2 06/26/2017 0946      Component Value Date/Time   CALCIUM 9.4 04/12/2018 1059   CALCIUM 9.3 06/26/2017 0946   ALKPHOS 71 04/12/2018 1059   ALKPHOS 75 06/26/2017 0946   AST 19 04/12/2018 1059   AST 11 06/26/2017 0946   ALT 18 04/12/2018 1059   ALT 14 06/26/2017 0946   BILITOT 0.4 04/12/2018 1059   BILITOT 0.31 06/26/2017 0946       Results for James [name], James [name] (MRN [id number]) as of 08/13/2018 12:36  Ref. Range 01/10/2018 15:12 04/12/2018 10:59  M Protein SerPl Elph-Mcnc Latest Ref Range: Not Observed g/dL 1.5 (H) 2.1 (H)   Results for James [name], James [name] (MRN [id number]) as of 08/13/2018 12:36  Ref. Range 01/10/2018 15:12 04/12/2018 10:59  IgA Latest Ref Range: 61 - 437 mg/dL 2,088 (H) 2,541 (H)     Impression and Plan:  73 year old with:  1.  Multiple myeloma diagnosed in 2017.  He presented with IgA Kappa mild anemia.    He received induction therapy with Revlimid and dexamethasone and achieved a complete response.  Protein studies from September 2019 showed a rise in his M spike as well as IgA level.  Repeat laboratory testing on August 12, 2018 showed a hemoglobin of 12 and normal creatinine without any evidence of end organ damage.  Skeletal survey on August 01, 2018 showed no evidence of lytic  lesions.  Risks and benefits of restarting myeloma therapy was reviewed.  Options of therapy would include restarting Revlimid versus Velcade based therapy.  The option of continued active surveillance was also discussed given the fact that he is asymptomatic.  For the time being, we opted to continue with close surveillance and repeat protein studies in 3 months.  2.  Chronic back and hip pain: Unrelated to multiple myeloma.  3.  Anemia: His hemoglobin continues to be above 12 with normal kidney function.  4.  Rib fracture: Appears to be traumatic not pathologic.  Continue supportive management at this time.  5.  Follow-up: In in 3 months   15  minutes was spent with the patient face-to-face today.  More than 50% of time was dedicated to discussing his disease status, treatment options, complications related therapy and coordinating plan of care.    Zola Button, MD 1/14/202012:34 PM

## 2018-08-20 DIAGNOSIS — H04123 Dry eye syndrome of bilateral lacrimal glands: Secondary | ICD-10-CM | POA: Diagnosis not present

## 2018-08-20 DIAGNOSIS — H40013 Open angle with borderline findings, low risk, bilateral: Secondary | ICD-10-CM | POA: Diagnosis not present

## 2018-08-20 DIAGNOSIS — H0102B Squamous blepharitis left eye, upper and lower eyelids: Secondary | ICD-10-CM | POA: Diagnosis not present

## 2018-08-20 DIAGNOSIS — H16223 Keratoconjunctivitis sicca, not specified as Sjogren's, bilateral: Secondary | ICD-10-CM | POA: Diagnosis not present

## 2018-08-20 DIAGNOSIS — E119 Type 2 diabetes mellitus without complications: Secondary | ICD-10-CM | POA: Diagnosis not present

## 2018-08-20 DIAGNOSIS — H01024 Squamous blepharitis left upper eyelid: Secondary | ICD-10-CM | POA: Diagnosis not present

## 2018-08-20 DIAGNOSIS — H01021 Squamous blepharitis right upper eyelid: Secondary | ICD-10-CM | POA: Diagnosis not present

## 2018-08-20 DIAGNOSIS — H0102A Squamous blepharitis right eye, upper and lower eyelids: Secondary | ICD-10-CM | POA: Diagnosis not present

## 2018-08-27 DIAGNOSIS — M961 Postlaminectomy syndrome, not elsewhere classified: Secondary | ICD-10-CM | POA: Diagnosis not present

## 2018-08-27 DIAGNOSIS — M48062 Spinal stenosis, lumbar region with neurogenic claudication: Secondary | ICD-10-CM | POA: Diagnosis not present

## 2018-09-05 DIAGNOSIS — R310 Gross hematuria: Secondary | ICD-10-CM | POA: Diagnosis not present

## 2018-09-05 DIAGNOSIS — N401 Enlarged prostate with lower urinary tract symptoms: Secondary | ICD-10-CM | POA: Diagnosis not present

## 2018-09-05 DIAGNOSIS — R350 Frequency of micturition: Secondary | ICD-10-CM | POA: Diagnosis not present

## 2018-09-05 DIAGNOSIS — N281 Cyst of kidney, acquired: Secondary | ICD-10-CM | POA: Diagnosis not present

## 2018-09-05 DIAGNOSIS — N5201 Erectile dysfunction due to arterial insufficiency: Secondary | ICD-10-CM | POA: Diagnosis not present

## 2018-09-11 ENCOUNTER — Ambulatory Visit: Payer: Medicare Other | Admitting: Cardiology

## 2018-10-03 ENCOUNTER — Ambulatory Visit (INDEPENDENT_AMBULATORY_CARE_PROVIDER_SITE_OTHER): Payer: Medicare PPO | Admitting: Cardiology

## 2018-10-03 ENCOUNTER — Encounter: Payer: Self-pay | Admitting: Cardiology

## 2018-10-03 VITALS — BP 130/80 | HR 85 | Ht 64.0 in | Wt 233.6 lb

## 2018-10-03 DIAGNOSIS — I130 Hypertensive heart and chronic kidney disease with heart failure and stage 1 through stage 4 chronic kidney disease, or unspecified chronic kidney disease: Secondary | ICD-10-CM

## 2018-10-03 DIAGNOSIS — I5042 Chronic combined systolic (congestive) and diastolic (congestive) heart failure: Secondary | ICD-10-CM | POA: Diagnosis not present

## 2018-10-03 DIAGNOSIS — Z7901 Long term (current) use of anticoagulants: Secondary | ICD-10-CM | POA: Diagnosis not present

## 2018-10-03 DIAGNOSIS — I4821 Permanent atrial fibrillation: Secondary | ICD-10-CM

## 2018-10-03 DIAGNOSIS — R0602 Shortness of breath: Secondary | ICD-10-CM

## 2018-10-03 DIAGNOSIS — E785 Hyperlipidemia, unspecified: Secondary | ICD-10-CM | POA: Insufficient documentation

## 2018-10-03 DIAGNOSIS — I2510 Atherosclerotic heart disease of native coronary artery without angina pectoris: Secondary | ICD-10-CM

## 2018-10-03 NOTE — Patient Instructions (Addendum)
Medication Instructions:  Your physician recommends that you continue on your current medications as directed. Please refer to the Current Medication list given to you today.  **Please make sure you are taking your mid-day medications!   If you need a refill on your cardiac medications before your next appointment, please call your pharmacy.   Lab work: Your physician recommends that you return for lab work today: CBC, CMP, lipid panel, ProBNP.   If you have labs (blood work) drawn today and your tests are completely normal, you will receive your results only by: Marland Kitchen MyChart Message (if you have MyChart) OR . A paper copy in the mail If you have any lab test that is abnormal or we need to change your treatment, we will call you to review the results.  Testing/Procedures: Your physician has requested that you have an echocardiogram. Echocardiography is a painless test that uses sound waves to create images of your heart. It provides your doctor with information about the size and shape of your heart and how well your heart's chambers and valves are working. This procedure takes approximately one hour. There are no restrictions for this procedure.  Follow-Up: At [facility], you and your health needs are our priority.  As part of our continuing mission to provide you with exceptional heart care, we have created designated Provider Care Teams.  These Care Teams include your primary Cardiologist (physician) and Advanced Practice Providers (APPs -  Physician Assistants and Nurse Practitioners) who all work together to provide you with the care you need, when you need it. You will need a follow up appointment in 3 months.       Heart Failure  Weigh yourself every morning when you first wake up and record on a calender or note pad, bring this to your office visits. Using a pill tender can help with taking your medications consistently.  Limit your fluid intake to 2 liters daily  Limit your  sodium intake to less than 2-3 grams daily. Ask if you need dietary teaching.  If you gain more than 3 pounds (from your dry weight ), double your dose of diuretic for the day.  If you gain more than 5 pounds (from your dry weight), double your dose of lasix and call your heart failure doctor.  Please do not smoke tobacco since it is very bad for your heart.  Please do not drink alcohol since it can worsen your heart failure.Also avoid OTC nonsteroidal drugs, such as advil, aleve and motrin.  Try to exercise for at least 30 minutes every day because this will help your heart be more efficient. You may be eligible for supervised cardiac rehab, ask your physician.     Echocardiogram An echocardiogram is a procedure that uses painless sound waves (ultrasound) to produce an image of the heart. Images from an echocardiogram can provide important information about:  Signs of coronary artery disease (CAD).  Aneurysm detection. An aneurysm is a weak or damaged part of an artery wall that bulges out from the normal force of blood pumping through the body.  Heart size and shape. Changes in the size or shape of the heart can be associated with certain conditions, including heart failure, aneurysm, and CAD.  Heart muscle function.  Heart valve function.  Signs of a past heart attack.  Fluid buildup around the heart.  Thickening of the heart muscle.  A tumor or infectious growth around the heart valves. Tell a health care provider about:  Any  allergies you have.  All medicines you are taking, including vitamins, herbs, eye drops, creams, and over-the-counter medicines.  Any blood disorders you have.  Any surgeries you have had.  Any medical conditions you have.  Whether you are pregnant or may be pregnant. What are the risks? Generally, this is a safe procedure. However, problems may occur, including:  Allergic reaction to dye (contrast) that may be used during the  procedure. What happens before the procedure? No specific preparation is needed. You may eat and drink normally. What happens during the procedure?   An IV tube may be inserted into one of your veins.  You may receive contrast through this tube. A contrast is an injection that improves the quality of the pictures from your heart.  A gel will be applied to your chest.  A wand-like tool (transducer) will be moved over your chest. The gel will help to transmit the sound waves from the transducer.  The sound waves will harmlessly bounce off of your heart to allow the heart images to be captured in real-time motion. The images will be recorded on a computer. The procedure may vary among health care providers and hospitals. What happens after the procedure?  You may return to your normal, everyday life, including diet, activities, and medicines, unless your health care provider tells you not to do that. Summary  An echocardiogram is a procedure that uses painless sound waves (ultrasound) to produce an image of the heart.  Images from an echocardiogram can provide important information about the size and shape of your heart, heart muscle function, heart valve function, and fluid buildup around your heart.  You do not need to do anything to prepare before this procedure. You may eat and drink normally.  After the echocardiogram is completed, you may return to your normal, everyday life, unless your health care provider tells you not to do that. This information is not intended to replace advice given to you by your health care provider. Make sure you discuss any questions you have with your health care provider. Document Released: 07/14/2000 Document Revised: 08/19/2016 Document Reviewed: 08/19/2016 Elsevier Interactive Patient Education  2019 [employer].

## 2018-10-03 NOTE — Progress Notes (Signed)
Cardiology Office Note:    Date:  10/03/2018   ID:  Kevin Lang, DOB [birth year], MRN [id number]  PCP:  Cathlyn Parsons, PA-C  Cardiologist:  Shirlee More, MD    Referring MD: Cathlyn Parsons, PA-C    ASSESSMENT:    1. Chronic combined systolic and diastolic heart failure ([place])   2. Heart & renal disease, hypertensive, with heart fail/chron kidney dis ([place])   3. Permanent atrial fibrillation   4. Chronic anticoagulation    PLAN:    In order of problems listed above:  1. 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  The real question is his prognosis and ejection fraction he is hesitant but agrees to have an echocardiogram and if EF remains severely reduced he said he would accept an ICD.  He will continue his current treatment including loop diuretic Spironolactone optimal dose of beta-blocker and combine hydralazine and Isordil as he is intolerant of ACE or Entresto. 2. Stable blood pressure target recheck renal function with baseline CKD and risk of hyperkalemia 3. Stable his rate is controlled with beta-blocker and anticoagulated 4. Continue his anticoagulant 5. CAD stable New York Heart Association class I continue medical treatment including his statin 6. Hyperlipidemia stable check lipid profile liver function   Next appointment: 3 months after echocardiogram   Medication Adjustments/Labs and Tests Ordered: Current medicines are reviewed at length with the patient today.  Concerns regarding medicines are outlined above.  No orders of the defined types were placed in this encounter.  No orders  of the defined types were placed in this encounter.   Chief Complaint  Patient presents with  . Follow-up    for heart failure    History of Present Illness:    Kevin Lang is a 73 y.o. 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 He was started on Entresto and developed angioedema and stopped. and he was placed on vasodilator therapy with hydralazine and isosorbide  He was  last seen 06/11/18. Compliance with diet, lifestyle and medications: Yes except, he works in days and needs of the garage misses his midday hydralazine and Isordil.  His daughter's presence (another pillbox for work and come up with a reminder system.  He sodium restricts he has no edema orthopnea still has trouble walking holding his arms above his head with weakness when he changes the oil and is short of breath when he walks a longer distance in the range of 100 foot.  No palpitations syncope or TIA he has had no chest pain and he tolerates his anticoagulant without bleeding complication Past Medical History:  Diagnosis Date  . Aneurysm of thoracic aorta ([place]) 02/16/2017   Overview:  Cardiac cath 12/08/15: Conclusions Diagnostic Procedure Summary 1. Mild non-obstructive CAD 2. Normal LV systolic function 3. Aortogram performed to eval aortic root aneurysm, 4.0 cm by this study.  CT Jan 2017, ascending aaorta aneurysm, 4.8  cm IMOUPDATE  . Benign essential hypertension 02/16/2017  . Cardiomyopathy ([place]) 08/23/2015  . Chronic anticoagulation 05/21/2016  . Chronic combined systolic and diastolic congestive heart failure ([place]) 02/16/2017  . CKD (chronic kidney disease) stage 3, GFR 30-59 ml/min ([facility]) 12/31/2015  . COPD (chronic obstructive pulmonary disease) ([place]) 05/21/2016  . Mild CAD 05/21/2016   Overview:   Cardiac cath 12/08/15:Conclusions Diagnostic Procedure Summary 1. Mild non-obstructive CAD 2. Normal LV systolic function 3. Aortogram performed to eval aortic root aneurysm, 4.0 cm by this study. Diagnostic Procedure Recommendations  . Persistent atrial fibrillation 02/16/2017    Past Surgical History:  Procedure Laterality Date  . CORONARY ANGIOPLASTY      Current Medications: Current Meds  Medication Sig  . apixaban (ELIQUIS) 5 MG TABS tablet Take 1 tablet (5 mg total) by mouth 2 (two) times daily.  Marland Kitchen atorvastatin (LIPITOR) 40 MG tablet Take 1 tablet (40 mg total) by mouth daily.  . cromolyn (NASALCROM) 5.2 MG/ACT nasal spray Place 1 spray into both nostrils as needed.   . hydrALAZINE (APRESOLINE) 25 MG tablet Take 1.5 tablets (37.5 mg total) by mouth 3 (three) times daily. (Patient taking differently: Take 37.5 mg by mouth 2 (two) times daily. )  . isosorbide dinitrate (ISORDIL) 40 MG tablet Take 1 tablet (40 mg total) by mouth 3 (three) times daily. (Patient taking differently: Take 40 mg by mouth 2 (two) times daily. )  . metoprolol tartrate (LOPRESSOR) 100 MG tablet Take 1 tablet (100 mg total) by mouth 2 (two) times daily.  . nitroGLYCERIN (NITROSTAT) 0.4 MG SL tablet Place 0.4 mg under the tongue every 5 (five) hours as needed.  . potassium chloride (K-DUR) 10 MEQ tablet Take 1 tablet (10 mEq total) by mouth daily.  Marland Kitchen spironolactone (ALDACTONE) 25 MG tablet Take 1 tablet (25 mg total) by mouth daily.  . tamsulosin (FLOMAX) 0.4 MG CAPS capsule Take 1 capsule (0.4 mg total) by mouth 2 (two) times daily. (Patient taking differently: Take 0.4 mg by mouth as needed. )  . torsemide (DEMADEX) 20 MG tablet Take 1 tablet (20 mg total) by mouth 2 (two) times daily.     Allergies:   Entresto [sacubitril-valsartan]   Social History   Socioeconomic History  . Marital status: Married    Spouse name: Not on file  . Number of children: Not on file  . Years of education: Not on file  .  Highest education level: Not on file  Occupational History  . Not on file  Social Needs  . Financial resource strain: Not on file  . Food insecurity:    Worry: Not on file    Inability: Not on file  . Transportation needs:    Medical: Not on file    Non-medical: Not on file  Tobacco Use  . Smoking status: Former Smoker    Packs/day: 0.50    Years: 30.00    Pack years: 15.00    Types: Cigarettes    Last attempt to quit: 08/22/2000    Years since quitting: 18.1  . Smokeless tobacco: Never Used  Substance and Sexual Activity  . Alcohol use: No  . Drug use: No  . Sexual activity: Not on file  Lifestyle  . Physical activity:    Days per week: Not on file    Minutes per session: Not on file  . Stress: Not on file  Relationships  . Social connections:    Talks on phone: Not on file    Gets together: Not  on file    Attends religious service: Not on file    Active member of club or organization: Not on file    Attends meetings of clubs or organizations: Not on file    Relationship status: Not on file  Other Topics Concern  . Not on file  Social History Narrative  . Not on file     Family History: The patient's family history includes Aneurysm in his father; Heart disease in his brother; Hypertension in his mother; Stroke in his mother. ROS:   Please see the history of present illness.    All other systems reviewed and are negative.  EKGs/Labs/Other Studies Reviewed:    The following studies were reviewed today:  Recent Labs: 06/11/2018: NT-Pro BNP 2,216 07/18/2018: BUN 28; Creatinine, Ser 1.85; Potassium 3.9; Sodium 147  Recent Lipid Panel No results found for: CHOL, TRIG, HDL, CHOLHDL, VLDL, LDLCALC, LDLDIRECT  Physical Exam:    VS:  BP 130/80 (BP Location: Right Arm, Patient Position: Sitting, Cuff Size: Large)   Pulse 85   Ht 5' 4"  (1.626 m)   Wt 233 lb 9.6 oz (106 kg)   SpO2 98%   BMI 40.10 kg/m     Wt Readings from Last 3 Encounters:  10/03/18 233 lb  9.6 oz (106 kg)  06/11/18 236 lb (107 kg)  04/30/18 233 lb 3.2 oz (105.8 kg)     GEN:  Well nourished, well developed in no acute distress HEENT: Normal NECK: No JVD; No carotid bruits LYMPHATICS: No lymphadenopathy CARDIAC: His rhythm is irregular S1 soft and variableno  Murmur rubs, gallops RESPIRATORY:  Clear to auscultation without rales, wheezing or rhonchi  ABDOMEN: Soft, non-tender, non-distended MUSCULOSKELETAL:  No edema; No deformity  SKIN: Warm and dry NEUROLOGIC:  Alert and oriented x 3 PSYCHIATRIC:  Normal affect    Signed, Shirlee More, MD  10/03/2018 10:21 AM    [place]

## 2018-10-04 LAB — COMPREHENSIVE METABOLIC PANEL
A/G RATIO: 1.8 (ref 1.2–2.2)
ALT: 27 IU/L (ref 0–44)
AST: 23 IU/L (ref 0–40)
Albumin: 4 g/dL (ref 3.7–4.7)
Alkaline Phosphatase: 74 IU/L (ref 39–117)
BUN/Creatinine Ratio: 17 (ref 10–24)
BUN: 28 mg/dL — ABNORMAL HIGH (ref 8–27)
Bilirubin Total: 0.8 mg/dL (ref 0.0–1.2)
CALCIUM: 9.1 mg/dL (ref 8.6–10.2)
CO2: 22 mmol/L (ref 20–29)
CREATININE: 1.67 mg/dL — AB (ref 0.76–1.27)
Chloride: 104 mmol/L (ref 96–106)
GFR, EST AFRICAN AMERICAN: 46 mL/min/{1.73_m2} — AB (ref >59)
GFR, EST NON AFRICAN AMERICAN: 40 mL/min/{1.73_m2} — AB (ref >59)
Globulin, Total: 2.2 g/dL (ref 1.5–4.5)
Glucose: 94 mg/dL (ref 65–99)
Potassium: 4.5 mmol/L (ref 3.5–5.2)
SODIUM: 143 mmol/L (ref 134–144)
TOTAL PROTEIN: 6.2 g/dL (ref 6.0–8.5)

## 2018-10-04 LAB — CBC
HEMATOCRIT: 49.2 % (ref 37.5–51.0)
Hemoglobin: 16.9 g/dL (ref 13.0–17.7)
MCH: 31.6 pg (ref 26.6–33.0)
MCHC: 34.3 g/dL (ref 31.5–35.7)
MCV: 92 fL (ref 79–97)
PLATELETS: 240 10*3/uL (ref 150–450)
RBC: 5.35 x10E6/uL (ref 4.14–5.80)
RDW: 15.8 % — ABNORMAL HIGH (ref 11.6–15.4)
WBC: 8.5 10*3/uL (ref 3.4–10.8)

## 2018-10-04 LAB — LIPID PANEL
CHOL/HDL RATIO: 3 ratio (ref 0.0–5.0)
Cholesterol, Total: 138 mg/dL (ref 100–199)
HDL: 46 mg/dL (ref >39)
LDL Calculated: 72 mg/dL (ref 0–99)
TRIGLYCERIDES: 102 mg/dL (ref 0–149)
VLDL Cholesterol Cal: 20 mg/dL (ref 5–40)

## 2018-10-04 LAB — PRO B NATRIURETIC PEPTIDE: NT-Pro BNP: 2751 pg/mL — ABNORMAL HIGH (ref 0–376)

## 2018-10-09 ENCOUNTER — Encounter: Payer: Self-pay | Admitting: Cardiology

## 2018-10-09 ENCOUNTER — Ambulatory Visit: Payer: Medicare HMO | Admitting: Cardiology

## 2018-10-09 ENCOUNTER — Other Ambulatory Visit: Payer: Self-pay

## 2018-10-09 VITALS — BP 150/70 | HR 99 | Ht 72.0 in | Wt 211.6 lb

## 2018-10-09 DIAGNOSIS — I10 Essential (primary) hypertension: Secondary | ICD-10-CM

## 2018-10-09 DIAGNOSIS — I4510 Unspecified right bundle-branch block: Secondary | ICD-10-CM

## 2018-10-09 DIAGNOSIS — I2510 Atherosclerotic heart disease of native coronary artery without angina pectoris: Secondary | ICD-10-CM | POA: Diagnosis not present

## 2018-10-09 DIAGNOSIS — Z7189 Other specified counseling: Secondary | ICD-10-CM

## 2018-10-09 DIAGNOSIS — R0602 Shortness of breath: Secondary | ICD-10-CM

## 2018-10-09 NOTE — Progress Notes (Signed)
Cardiology Office Note:    Date:  10/09/2018   ID:  James Mantis., DOB 1946/04/22, MRN [id number]  PCP:  [name], MD  Cardiologist:  James Dresser, MD PhD  Referring MD: Alexis Frock, MD   CC: establish care, shortness of breath  History of Present Illness:    James Menz. is a 73 y.o. male with a hx of CAD, multiple myeloma, lupus, hepatitis B and C (treated), type II diabetes previously on insulin but now on metformin/glimepiride who is seen as a new consult at the request of Alexis Frock, MD for the evaluation and management of dyspnea on exertion with a history of CAD.  Cardiac history: first told in the late 90s ([place], [place]-->treated in [place], [place]) that he had heart disease. Was being evaluated for chest pain. Had a treadmill stress test and had syncope, was treated with nitroglycerin. Was told he had a 98% blockage and would need surgery. Prayed about it, started feeling better. They couldn't find his tests and had to repeat the cath, was told his blockage went from 98% to 10% on its own. Surgery was cancelled, no stents or surgery needed. Has had no issues since.  Also dealing with effects from multiple myeloma, treated with chemo (revlimid/dexamethasone) and currently controlled. Has been stable and feeling well for about a year, except for shortness of breath. SOB has been since around the time of chemo. Worse when he tries to squat. Also noted that his breathing would gradually get shorter with exertion, but if he pushed through it he could keep going. Can walk about a mile routinely. Doesn't happen all the time, but worse with strenuous activity.   Denies chest pain, shortness of breath at rest. No PND, orthopnea, LE edema or unexpected weight gain. No syncope or palpitations. Just started back sleeping in the bed, wasn't sleeping in the bed due to pain from his multiple myeloma, not because of his breathing. Has had multiple broken ribs due  to multiple myeloma, most recently a few weeks ago.  Past Medical History:  Diagnosis Date  . Anemia   . Arthritis   . BPH (benign prostatic hyperplasia)   . Chronic pain   . Diabetes mellitus   . GERD (gastroesophageal reflux disease)   . Headache(784.0)    migraines, sinus headaches  . Hepatitis    C and B-treated  . HLD (hyperlipidemia)   . Hypertension   . Pneumonia 07/2011  . Sleep apnea 03/2018   going for fitting on 04-29-18  . Thyroid disease    goiter  . Urinary frequency     Past Surgical History:  Procedure Laterality Date  . ABDOMINAL SURGERY    . BACK SURGERY     x 5   . CHOLECYSTECTOMY    . COLONOSCOPY    . cyst removal skull  20 years ago  . ETHMOIDECTOMY  10/12/2011   Procedure: ETHMOIDECTOMY;  Surgeon: Thornell Sartorius, MD;  Location: [facility] OR;  Service: ENT;  Laterality: Bilateral;  bilateral maxillary sinus osteal enlargement, frontal sinusotomy  . EYE SURGERY     bilat cataract with lens implants  . HAND SURGERY     right finger  . HERNIA REPAIR    . ROTATOR CUFF REPAIR     bilateral  . SHOULDER ARTHROSCOPY WITH ROTATOR CUFF REPAIR Left 05/18/2014   Procedure: SHOULDER ARTHROSCOPY WITH ROTATOR CUFF REPAIR;  Surgeon: Nita Sells, MD;  Location: [place];  Service: Orthopedics;  Laterality:  Left;  Left shoulder arthroscopy rotator cuff repair, subacromial decompression  . sinus surgery    . TONSILLECTOMY    . TRIGGER FINGER RELEASE Right 05/02/2018   Procedure: RELEASE TRIGGER FINGER/A-1 PULLEY RIGHT SMALL FINGER;  Surgeon: Leanora Cover, MD;  Location: [place];  Service: Orthopedics;  Laterality: Right;    Current Medications: Current Outpatient Medications on File Prior to Visit  Medication Sig  . amLODipine (NORVASC) 10 MG tablet Take 10 mg by mouth every morning.   . ferrous sulfate 325 (65 FE) MG tablet Take 325 mg by mouth 2 (two) times daily with a meal.   . finasteride (PROSCAR) 5 MG tablet Take 5  mg by mouth daily.  Marland Kitchen gabapentin (NEURONTIN) 400 MG capsule Take 400 mg by mouth 3 (three) times daily.   Marland Kitchen glimepiride (AMARYL) 2 MG tablet Take 2 mg by mouth daily.  Marland Kitchen HYDROcodone-acetaminophen (NORCO) 10-325 MG tablet Take 1 tablet by mouth 3 (three) times daily.   Marland Kitchen HYDROcodone-acetaminophen (NORCO) 5-325 MG tablet Take 1 tablet by mouth every 6 (six) hours as needed. 1-2 tabs po q6 hours prn pain  . losartan (COZAAR) 50 MG tablet Take 50 mg by mouth every morning.   . metFORMIN (GLUCOPHAGE-XR) 500 MG 24 hr tablet Take 1,000 mg by mouth 2 (two) times daily.   Marland Kitchen omeprazole (PRILOSEC) 20 MG capsule Take 20 mg by mouth 2 (two) times daily before a meal.   . Tamsulosin HCl (FLOMAX) 0.4 MG CAPS Take 0.4 mg by mouth 2 (two) times daily.   . traMADol (ULTRAM) 50 MG tablet Take 50 mg by mouth 4 (four) times daily -  with meals and at bedtime. For pain  . [DISCONTINUED] terazosin (HYTRIN) 5 MG capsule Take 5 mg by mouth 2 (two) times daily.    No current facility-administered medications on file prior to visit.      Allergies:   Invokana [canagliflozin]   Social History   Socioeconomic History  . Marital status: Married    Spouse name: Not on file  . Number of children: Not on file  . Years of education: Not on file  . Highest education level: Not on file  Occupational History  . Not on file  Social Needs  . Financial resource strain: Not on file  . Food insecurity:    Worry: Not on file    Inability: Not on file  . Transportation needs:    Medical: Not on file    Non-medical: Not on file  Tobacco Use  . Smoking status: Former Smoker    Last attempt to quit: 05/12/1984    Years since quitting: 34.4  . Smokeless tobacco: Never Used  Substance and Sexual Activity  . Alcohol use: No  . Drug use: No  . Sexual activity: Not on file  Lifestyle  . Physical activity:    Days per week: Not on file    Minutes per session: Not on file  . Stress: Not on file  Relationships  . Social  connections:    Talks on phone: Not on file    Gets together: Not on file    Attends religious service: Not on file    Active member of club or organization: Not on file    Attends meetings of clubs or organizations: Not on file    Relationship status: Not on file  Other Topics Concern  . Not on file  Social History Narrative  . Not on file     Family  History: The patient's family history includes Hyperlipidemia in his mother; Hypertension in his mother.  ROS:   Please see the history of present illness.  Additional pertinent ROS:  Constitutional: Negative for chills, fever, unintentional weight loss. Positive for occasional night sweats about once/week HENT: Negative for ear pain and hearing loss.   Eyes: Negative for loss of vision and eye pain.  Respiratory: Negative for wheezing.  Positive for 1 week of cough and light sputum. Positive for SOB as above. Cardiovascular: See HPI. Gastrointestinal: Negative for abdominal pain, melena, and hematochezia.  Genitourinary: Negative for dysuria and hematuria.  Musculoskeletal: Negative for falls and myalgias.  Skin: Negative for itching and rash.  Neurological: Negative for focal weakness, focal sensory changes and loss of consciousness.  Endo/Heme/Allergies: Does not bruise/bleed easily.    EKGs/Labs/Other Studies Reviewed:    The following studies were reviewed today: Echo 11/2016: EF 55-60%, normal diastology, no significant valve disease, normal CVP.  EKG:  EKG is personally reviewed.  The ekg ordered today demonstrates NSR, RBBB  Recent Labs: 04/12/2018: ALT 18; BUN 11; Creatinine 1.35; Hemoglobin 12.1; Platelet Count 191; Potassium 3.8; Sodium 140  Recent Lipid Panel No results found for: CHOL, TRIG, HDL, CHOLHDL, VLDL, LDLCALC, LDLDIRECT  Physical Exam:    VS:  BP (!) 150/70   Pulse 99   Ht 6' (1.829 m)   Wt 211 lb 9.6 oz (96 kg)   BMI 28.70 kg/m     Wt Readings from Last 3 Encounters:  10/09/18 211 lb 9.6 oz (96  kg)  08/13/18 214 lb 8 oz (97.3 kg)  05/02/18 207 lb 3.7 oz (94 kg)     GEN: Well nourished, well developed in no acute distress HEENT: Normal NECK: No JVD; No carotid bruits LYMPHATICS: No lymphadenopathy CARDIAC: regular rhythm, normal S1 and S2, no murmurs, rubs, gallops. Radial and DP pulses 2+ bilaterally. RESPIRATORY:  Clear to auscultation without rales, wheezing or rhonchi  ABDOMEN: Soft, non-tender, non-distended MUSCULOSKELETAL:  No edema; No deformity  SKIN: Warm and dry NEUROLOGIC:  Alert and oriented x 3 PSYCHIATRIC:  Normal affect   ASSESSMENT:    1. Shortness of breath   2. Coronary artery disease involving native coronary artery of native heart without angina pectoris   3. RBBB   4. Essential hypertension   5. Cardiac risk counseling    PLAN:    Shortness of breath: predominantly with exertion, though also at other times, such as squatting. Reports history of CAD without intervention, though I do not have access to these records. Will evaluate for cardiac cause, but if none found, would consider PFTs and increased conditioning -echo for function and structure -discussed lexiscan nuclear (as he does not think he can treadmill) vs. Coronary CTA. With prior coronary disease and unknown calcification status, there is potential that we may have difficulty seeing vessels fully on CT. Also has baseline CKD so want to avoid contrast. Will proceed with lexiscan nuclear test.  History of CAD: reported, no interventions, no chest pain (which was his prior anginal equivalent). Workup as above.  Hypertension: goal <130/80, elevated today. Has been elevated on recent visits as well. Managed by his PCP. If still elevated on follow up, will make adjustments to BP medications.  RBBB: seen on prior ECG, no issues  Cardiac risk counseling and prevention recommendations: -recommend heart healthy/Mediterranean diet, with whole grains, fruits, vegetable, fish, lean meats, nuts, and  olive oil. Limit salt. -recommend moderate walking, 3-5 times/week for 30-50 minutes each session. Aim  for at least 150 minutes.week. Goal should be pace of 3 miles/hours, or walking 1.5 miles in 30 minutes -recommend avoidance of tobacco products. Avoid excess alcohol. -Additional risk factor control:  -Diabetes: A1c is 8.0  -Lipids: Tchol 138, HDL 46, LDL 72, TG 102. LDL goal <70 with prior history of CAD.  -Blood pressure control: as above  -Weight: BMI 28  Plan for follow up: 3 mos  Medication Adjustments/Labs and Tests Ordered: Current medicines are reviewed at length with the patient today.  Concerns regarding medicines are outlined above.  Orders Placed This Encounter  Procedures  . MYOCARDIAL PERFUSION IMAGING  . EKG 12-Lead  . ECHOCARDIOGRAM COMPLETE   No orders of the defined types were placed in this encounter.   Patient Instructions  Medication Instructions:  Your physician recommends that you continue on your current medications as directed. Please refer to the Current Medication list given to you today.  If you need a refill on your cardiac medications before your next appointment, please call your pharmacy.   Testing/Procedures:  (Both at [facility] street office on same day if possible per Dr. Harrell Gave)  Your physician has requested that you have an echocardiogram. Echocardiography is a painless test that uses sound waves to create images of your heart. It provides your doctor with information about the size and shape of your heart and how well your heart's chambers and valves are working. This procedure takes approximately one hour. There are no restrictions for this procedure.  Your physician has requested that you have a lexiscan myoview. For further information please visit [email or web address]. Please follow instruction sheet, as given.   Follow-Up: At [facility] you and your health needs are our priority.  As part of our continuing mission to provide you  with exceptional heart care, we have created designated Provider Care Teams.  These Care Teams include your primary Cardiologist (physician) and Advanced Practice Providers (APPs -  Physician Assistants and Nurse Practitioners) who all work together to provide you with the care you need, when you need it. You will need a follow up appointment in 3 months.  Please call our office 2 months in advance to schedule this appointment.  You may see Dr. Harrell Gave or one of the following Advanced Practice Providers on your designated Care Team:   Rosaria Ferries, PA-C . Jory Sims, DNP, ANP  Any Other Special Instructions Will Be Listed Below (If Applicable). None      Signed, James Dresser, MD PhD 10/09/2018 9:46 AM    [facility]

## 2018-10-09 NOTE — Patient Instructions (Signed)
Medication Instructions:  Your physician recommends that you continue on your current medications as directed. Please refer to the Current Medication list given to you today.  If you need a refill on your cardiac medications before your next appointment, please call your pharmacy.   Testing/Procedures:  (Both at [facility] street office on same day if possible per Dr. Harrell Gave)  Your physician has requested that you have an echocardiogram. Echocardiography is a painless test that uses sound waves to create images of your heart. It provides your doctor with information about the size and shape of your heart and how well your heart's chambers and valves are working. This procedure takes approximately one hour. There are no restrictions for this procedure.  Your physician has requested that you have a lexiscan myoview. For further information please visit [email or web address]. Please follow instruction sheet, as given.   Follow-Up: At [facility], you and your health needs are our priority.  As part of our continuing mission to provide you with exceptional heart care, we have created designated Provider Care Teams.  These Care Teams include your primary Cardiologist (physician) and Advanced Practice Providers (APPs -  Physician Assistants and Nurse Practitioners) who all work together to provide you with the care you need, when you need it. You will need a follow up appointment in 3 months.  Please call our office 2 months in advance to schedule this appointment.  You may see Dr. Harrell Gave or one of the following Advanced Practice Providers on your designated Care Team:   Rosaria Ferries, PA-C . Jory Sims, DNP, ANP  Any Other Special Instructions Will Be Listed Below (If Applicable). None

## 2018-10-16 ENCOUNTER — Ambulatory Visit (HOSPITAL_COMMUNITY): Payer: Medicare HMO | Attending: Internal Medicine

## 2018-10-16 ENCOUNTER — Other Ambulatory Visit: Payer: Self-pay

## 2018-10-16 DIAGNOSIS — R0602 Shortness of breath: Secondary | ICD-10-CM

## 2018-10-17 ENCOUNTER — Telehealth: Payer: Self-pay | Admitting: Internal Medicine

## 2018-10-17 NOTE — Telephone Encounter (Signed)
Err

## 2018-10-18 ENCOUNTER — Telehealth: Payer: Self-pay | Admitting: Cardiology

## 2018-10-18 ENCOUNTER — Encounter: Payer: Self-pay | Admitting: Cardiology

## 2018-10-18 ENCOUNTER — Telehealth (HOSPITAL_COMMUNITY): Payer: Self-pay | Admitting: *Deleted

## 2018-10-18 NOTE — Telephone Encounter (Signed)
   Primary Cardiologist:  Buford Dresser, MD   Called to review results of echo, discussed with him. Also discussed that he is scheduled for nuclear stress test on 3/24. His symptoms are unchanged from the visit. Discussed that if he has progression of his symptoms, he should call the office, and we will have him seen/tested more urgently. If he has severe symptoms, he should seek emergency medical care. He understands. He appreciates the call and is amenable to rescheduling his nuclear study for >4 weeks out.  Cardiac Questionnaire:   Since your last visit or hospitalization:   1. Have you been having new or worsening chest pain? No   2. Have you been having new or worsening shortness of breath? No 3. Have you been having new or worsening leg swelling, wt gain, or increase in abdominal girth (pants fitting more tightly)? No   4. Have you had any passing out spells? No    *A YES to any of these questions would result in the appointment being kept. *If all the answers to these questions are NO, we should indicate that given the current situation regarding the worldwide coronarvirus pandemic, at the recommendation of the CDC, we are looking to limit gatherings in our waiting area, and thus will reschedule their appointment beyond four weeks from today.    Patient contacted.  History reviewed.  No symptoms to suggest any unstable cardiac conditions.  Based on discussion, with current pandemic situation, we will be postponing this appointment for nuclear stress test.  If symptoms change, he has been instructed to contact our office.  Routing to [employer] to reschedule nuclear study >4 weeks out.  Buford Dresser, MD  10/18/2018 10:04 AM         .

## 2018-10-18 NOTE — Telephone Encounter (Signed)
Close encounter 

## 2018-10-22 ENCOUNTER — Ambulatory Visit (HOSPITAL_COMMUNITY)
Admission: RE | Admit: 2018-10-22 | Payer: Medicare HMO | Source: Ambulatory Visit | Attending: Cardiology | Admitting: Cardiology

## 2018-11-07 ENCOUNTER — Other Ambulatory Visit: Payer: Medicare PPO

## 2018-11-12 ENCOUNTER — Other Ambulatory Visit: Payer: Self-pay

## 2018-11-12 ENCOUNTER — Other Ambulatory Visit: Payer: Self-pay | Admitting: Medical

## 2018-11-12 ENCOUNTER — Ambulatory Visit (HOSPITAL_COMMUNITY)
Admission: RE | Admit: 2018-11-12 | Discharge: 2018-11-12 | Disposition: A | Discharge status: Home / Self Care | Payer: Medicare HMO | Source: Ambulatory Visit | Attending: Medical | Admitting: Medical

## 2018-11-12 ENCOUNTER — Inpatient Hospital Stay (HOSPITAL_BASED_OUTPATIENT_CLINIC_OR_DEPARTMENT_OTHER): Payer: Medicare HMO | Admitting: Medical

## 2018-11-12 ENCOUNTER — Inpatient Hospital Stay: Payer: Medicare HMO | Attending: Oncology

## 2018-11-12 VITALS — BP 155/68 | HR 99 | Temp 99.5°F | Resp 18 | Ht 72.0 in | Wt 211.2 lb

## 2018-11-12 DIAGNOSIS — R0781 Pleurodynia: Secondary | ICD-10-CM | POA: Diagnosis not present

## 2018-11-12 DIAGNOSIS — C9001 Multiple myeloma in remission: Secondary | ICD-10-CM

## 2018-11-12 DIAGNOSIS — R0789 Other chest pain: Secondary | ICD-10-CM | POA: Diagnosis not present

## 2018-11-12 DIAGNOSIS — Z79899 Other long term (current) drug therapy: Secondary | ICD-10-CM | POA: Insufficient documentation

## 2018-11-12 DIAGNOSIS — R0782 Intercostal pain: Secondary | ICD-10-CM | POA: Diagnosis not present

## 2018-11-12 DIAGNOSIS — D649 Anemia, unspecified: Secondary | ICD-10-CM | POA: Insufficient documentation

## 2018-11-12 DIAGNOSIS — D729 Disorder of white blood cells, unspecified: Secondary | ICD-10-CM

## 2018-11-12 LAB — CBC WITH DIFFERENTIAL (CANCER CENTER ONLY)
Abs Immature Granulocytes: 0.03 10*3/uL (ref 0.00–0.07)
Basophils Absolute: 0 10*3/uL (ref 0.0–0.1)
Basophils Relative: 0 %
Eosinophils Absolute: 0.1 10*3/uL (ref 0.0–0.5)
Eosinophils Relative: 2 %
HCT: 33.3 % — ABNORMAL LOW (ref 39.0–52.0)
Hemoglobin: 11 g/dL — ABNORMAL LOW (ref 13.0–17.0)
Immature Granulocytes: 0 %
Lymphocytes Relative: 32 %
Lymphs Abs: 2.1 10*3/uL (ref 0.7–4.0)
MCH: 29.4 pg (ref 26.0–34.0)
MCHC: 33 g/dL (ref 30.0–36.0)
MCV: 89 fL (ref 80.0–100.0)
Monocytes Absolute: 0.5 10*3/uL (ref 0.1–1.0)
Monocytes Relative: 7 %
Neutro Abs: 3.9 10*3/uL (ref 1.7–7.7)
Neutrophils Relative %: 59 %
Platelet Count: 196 10*3/uL (ref 150–400)
RBC: 3.74 MIL/uL — ABNORMAL LOW (ref 4.22–5.81)
RDW: 14.5 % (ref 11.5–15.5)
WBC Count: 6.7 10*3/uL (ref 4.0–10.5)
nRBC: 0 % (ref 0.0–0.2)

## 2018-11-12 LAB — CMP (CANCER CENTER ONLY)
ALT: 10 U/L (ref 0–44)
AST: 15 U/L (ref 15–41)
Albumin: 3.2 g/dL — ABNORMAL LOW (ref 3.5–5.0)
Alkaline Phosphatase: 75 U/L (ref 38–126)
Anion gap: 16 — ABNORMAL HIGH (ref 5–15)
BUN: 10 mg/dL (ref 8–23)
CO2: 23 mmol/L (ref 22–32)
Calcium: 9.5 mg/dL (ref 8.9–10.3)
Chloride: 101 mmol/L (ref 98–111)
Creatinine: 1.46 mg/dL — ABNORMAL HIGH (ref 0.61–1.24)
GFR, Est AFR Am: 54 mL/min — ABNORMAL LOW (ref >60)
GFR, Estimated: 47 mL/min — ABNORMAL LOW (ref >60)
Glucose, Bld: 195 mg/dL — ABNORMAL HIGH (ref 70–99)
Potassium: 4 mmol/L (ref 3.5–5.1)
Sodium: 140 mmol/L (ref 135–145)
Total Bilirubin: 0.3 mg/dL (ref 0.3–1.2)
Total Protein: 10.6 g/dL — ABNORMAL HIGH (ref 6.5–8.1)

## 2018-11-12 NOTE — Progress Notes (Signed)
Pt presents for right rib pain after possible injury.  Pt seen by PA Lucianne Lei only, no RN assessment at this time.  PA aware.

## 2018-11-12 NOTE — Patient Instructions (Addendum)
Chest X-Ray  A chest X-ray is a painless test that uses radiation to create images of the structures inside of your chest. Chest X-rays are used to look for many health conditions, including heart failure, pneumonia, tuberculosis, rib fractures, breathing disorders, and cancer. They may be used to diagnose chest pain, constant coughing, or trouble breathing. Tell a health care provider about:  Any allergies you have.  All medicines you are taking, including vitamins, herbs, eye drops, creams, and over-the-counter medicines.  Any surgeries you have had.  Any medical conditions you have.  Whether you are pregnant or may be pregnant. What are the risks? Getting a chest X-ray is a safe procedure. However, you will be exposed to a small amount of radiation. Being exposed to too much radiation over a lifetime can increase the risk of cancer. This risk is small, but it may occur if you have many X-rays throughout your life. What happens before the procedure?  You may be asked to remove glasses, jewelry, and any other metal objects.  You will be asked to undress from the waist up. You may be given a hospital gown to wear.  You may be asked to wear a protective lead apron to protect parts of your body from radiation. What happens during the procedure?  You will be asked to stand still as each picture is taken to get the best possible images.  You will be asked to take a deep breath and hold your breath for a few seconds.  The X-ray machine will create a picture of your chest using a tiny burst of radiation. This is painless.  More pictures may be taken from other angles. Typically, one picture will be taken while you face the X-ray camera, and another picture will be taken from the side while you stand. If you cannot stand, you may be asked to lie down. The procedure may vary among health care providers and hospitals. What happens after the procedure?  The X-ray(s) will be reviewed by your  health care provider or an X-ray (radiology) specialist.  It is up to you to get your test results. Ask your health care provider, or the department that is doing the test, when your results will be ready.  Your health care provider will tell you if you need more tests or a follow-up exam. Keep all follow-up visits as told by your health care provider. This is important. Summary  A chest X-ray is a safe, painless test that is used to examine the inside of the chest, heart, and lungs.  You will need to undress from the waist up and remove jewelry and metal objects before the procedure.  You will be exposed to a small amount of radiation during the procedure.  The X-ray machine will take one or more pictures of your chest while you remain as still as possible.  Later, a health care provider or specialist will review the test results with you. This information is not intended to replace advice given to you by your health care provider. Make sure you discuss any questions you have with your health care provider. Document Released: 09/12/2016 Document Revised: 09/12/2016 Document Reviewed: 09/12/2016 Elsevier Interactive Patient Education  2019 [employer]  

## 2018-11-13 LAB — MULTIPLE MYELOMA PANEL, SERUM
Albumin SerPl Elph-Mcnc: 3.5 g/dL (ref 2.9–4.4)
Albumin/Glob SerPl: 0.6 — ABNORMAL LOW (ref 0.7–1.7)
Alpha 1: 0.2 g/dL (ref 0.0–0.4)
Alpha2 Glob SerPl Elph-Mcnc: 0.8 g/dL (ref 0.4–1.0)
B-Globulin SerPl Elph-Mcnc: 4.8 g/dL — ABNORMAL HIGH (ref 0.7–1.3)
Gamma Glob SerPl Elph-Mcnc: 0.6 g/dL (ref 0.4–1.8)
Globulin, Total: 6.4 g/dL — ABNORMAL HIGH (ref 2.2–3.9)
IgA: 4707 mg/dL — ABNORMAL HIGH (ref 61–437)
IgG (Immunoglobin G), Serum: 571 mg/dL — ABNORMAL LOW (ref 603–1613)
IgM (Immunoglobulin M), Srm: 6 mg/dL — ABNORMAL LOW (ref 15–143)
M Protein SerPl Elph-Mcnc: 3.9 g/dL — ABNORMAL HIGH
Total Protein ELP: 9.9 g/dL — ABNORMAL HIGH (ref 6.0–8.5)

## 2018-11-13 LAB — KAPPA/LAMBDA LIGHT CHAINS
Kappa free light chain: 19.1 mg/L (ref 3.3–19.4)
Kappa, lambda light chain ratio: 1.84 — ABNORMAL HIGH (ref 0.26–1.65)
Lambda free light chains: 10.4 mg/L (ref 5.7–26.3)

## 2018-11-13 NOTE — Progress Notes (Signed)
Symptoms Management Clinic Progress Note   James [name] [id number] Jun 02, 1946 73 y.o.  [name]. is managed by Dr. Alen Blew  Actively treated with chemotherapy/immunotherapy/hormonal therapy: no  Next scheduled appointment with provider: 11/19/2018  Assessment: Plan:    Rib pain on right side  Multiple myeloma in remission ([place])   Right rib pain: The patient was referred for rib x-rays which returned showing: No fracture or other bone lesions are seen involving the ribs. There is no evidence of pneumothorax or pleural effusion. Both lungs are clear. Heart size and mediastinal contours are within normal limits.  I have asked the patient to contact Dr. Luana Shu who is managing his chronic pain.  Multiple myeloma: The patient continues to be followed conservatively by Dr. Alen Blew and is scheduled to be seen in follow-up on 11/19/2018.  Please see After Visit Summary for patient specific instructions.  Future Appointments  Date Time Provider [place]  11/19/2018  3:00 PM Wyatt Portela, MD [facility] None  11/29/2018  9:15 AM [facility] N[facility] MED [facility]  01/24/2019 11:00 AM Buford Dresser, MD [facility]    No orders of the defined types were placed in this encounter.      Subjective:   Patient ID:  James [name]. is a 73 y.o. (DOB 1945/11/08) male.  Chief Complaint:  Chief Complaint  Patient presents with   Chest Pain    R lower rib pain, possible recent injury    HPI James [name]. is a 73 year old male with a history of multiple myeloma who is followed by Dr. Alen Blew and has been managed conservatively.  He reports that he was working in his yard recently when he came into the house.  He bent over to pick up an item off of the floor and did not realize that he had a large item in his pocket.  He had them in his pocket pushed into his right rib.  He reports that he had acute pain and heard a popping noise.  He  reports he has had several broken ribs secondary to his multiple myeloma.  He believes that he could have fractured a rib although history rib x-ray returned negative.  He is managed by the pain clinic.  He is taking tramadol, hydrocodone, and Neurontin.  He has also received steroid injections to his back and believes that he may be due for a another injection.  He was here today for a lab appointment when he filled out a walk-in slip to be seen.  Medications: I have reviewed the patient's current medications.  Allergies:  Allergies  Allergen Reactions   Invokana [Canagliflozin] Anaphylaxis    Facial and lip swelling    Past Medical History:  Diagnosis Date   Anemia    Arthritis    BPH (benign prostatic hyperplasia)    Chronic pain    Diabetes mellitus    GERD (gastroesophageal reflux disease)    Headache(784.0)    migraines, sinus headaches   Hepatitis    C and B-treated   HLD (hyperlipidemia)    Hypertension    Pneumonia 07/2011   Sleep apnea 03/2018   going for fitting on 04-29-18   Thyroid disease    goiter   Urinary frequency     Past Surgical History:  Procedure Laterality Date   ABDOMINAL SURGERY     BACK SURGERY     x 5    CHOLECYSTECTOMY     COLONOSCOPY  cyst removal skull  20 years ago   ETHMOIDECTOMY  10/12/2011   Procedure: ETHMOIDECTOMY;  Surgeon: Thornell Sartorius, MD;  Location: [facility] OR;  Service: ENT;  Laterality: Bilateral;  bilateral maxillary sinus osteal enlargement, frontal sinusotomy   EYE SURGERY     bilat cataract with lens implants   HAND SURGERY     right finger   HERNIA REPAIR     ROTATOR CUFF REPAIR     bilateral   SHOULDER ARTHROSCOPY WITH ROTATOR CUFF REPAIR Left 05/18/2014   Procedure: SHOULDER ARTHROSCOPY WITH ROTATOR CUFF REPAIR;  Surgeon: Nita Sells, MD;  Location: [place];  Service: Orthopedics;  Laterality: Left;  Left shoulder arthroscopy rotator cuff repair, subacromial  decompression   sinus surgery     TONSILLECTOMY     TRIGGER FINGER RELEASE Right 05/02/2018   Procedure: RELEASE TRIGGER FINGER/A-1 PULLEY RIGHT SMALL FINGER;  Surgeon: Leanora Cover, MD;  [place];  Service: Orthopedics;  Laterality: Right;    Family History  Problem Relation Age of Onset   Hyperlipidemia Mother    Hypertension Mother     Social History   Socioeconomic History   Marital status: Married    Spouse name: Not on file   Number of children: Not on file   Years of education: Not on file   Highest education level: Not on file  Occupational History   Not on file  Social Needs   Financial resource strain: Not on file   Food insecurity:    Worry: Not on file    Inability: Not on file   Transportation needs:    Medical: Not on file    Non-medical: Not on file  Tobacco Use   Smoking status: Former Smoker    Last attempt to quit: 05/12/1984    Years since quitting: 34.5   Smokeless tobacco: Never Used  Substance and Sexual Activity   Alcohol use: No   Drug use: No   Sexual activity: Not on file  Lifestyle   Physical activity:    Days per week: Not on file    Minutes per session: Not on file   Stress: Not on file  Relationships   Social connections:    Talks on phone: Not on file    Gets together: Not on file    Attends religious service: Not on file    Active member of club or organization: Not on file    Attends meetings of clubs or organizations: Not on file    Relationship status: Not on file   Intimate partner violence:    Fear of current or ex partner: Not on file    Emotionally abused: Not on file    Physically abused: Not on file    Forced sexual activity: Not on file  Other Topics Concern   Not on file  Social History Narrative   Not on file    Past Medical History, Surgical history, Social history, and Family history were reviewed and updated as appropriate.   Please see review of systems  for further details on the patient's review from today.   Review of Systems:  Review of Systems  Constitutional: Negative for chills, diaphoresis and fever.  HENT: Negative for trouble swallowing and voice change.   Respiratory: Negative for cough, chest tightness, shortness of breath and wheezing.   Cardiovascular: Positive for chest pain. Negative for palpitations.  Gastrointestinal: Negative for abdominal pain, constipation, diarrhea, nausea and vomiting.  Musculoskeletal: Positive for arthralgias  and back pain. Negative for myalgias.  Neurological: Negative for dizziness, light-headedness and headaches.    Objective:   Physical Exam:  BP (!) 155/68 (BP Location: Right Arm, Patient Position: Sitting) Comment: nurse aware of bp   Pulse 99    Temp 99.5 F (37.5 C) (Oral)    Resp 18    Ht 6' (1.829 m)    Wt 211 lb 3.2 oz (95.8 kg)    SpO2 99%    BMI 28.64 kg/m  ECOG: 1  Physical Exam Constitutional:      Appearance: He is well-developed.  HENT:     Head: Normocephalic and atraumatic.  Cardiovascular:     Heart sounds: Normal heart sounds. Heart sounds not distant. No murmur. No friction rub. No gallop.   Pulmonary:     Effort: Pulmonary effort is normal. No respiratory distress.     Breath sounds: Normal breath sounds.  Chest:     Chest wall: Tenderness (Tenderness over the right lateral ribs.) present.  Skin:    General: Skin is warm and dry.  Neurological:     General: No focal deficit present.     Lab Review:     Component Value Date/Time   NA 140 11/12/2018 1156   NA 138 06/26/2017 0946   K 4.0 11/12/2018 1156   K 3.8 06/26/2017 0946   CL 101 11/12/2018 1156   CL 105 08/09/2012 0918   CO2 23 11/12/2018 1156   CO2 25 06/26/2017 0946   GLUCOSE 195 (H) 11/12/2018 1156   GLUCOSE 279 (H) 06/26/2017 0946   GLUCOSE 178 (H) 08/09/2012 0918   BUN 10 11/12/2018 1156   BUN 16.4 06/26/2017 0946   CREATININE 1.46 (H) 11/12/2018 1156   CREATININE 1.2 06/26/2017 0946    CALCIUM 9.5 11/12/2018 1156   CALCIUM 9.3 06/26/2017 0946   PROT 10.6 (H) 11/12/2018 1156   PROT 7.5 06/26/2017 0946   PROT 8.1 06/26/2017 0946   ALBUMIN 3.2 (L) 11/12/2018 1156   ALBUMIN 3.6 06/26/2017 0946   AST 15 11/12/2018 1156   AST 11 06/26/2017 0946   ALT 10 11/12/2018 1156   ALT 14 06/26/2017 0946   ALKPHOS 75 11/12/2018 1156   ALKPHOS 75 06/26/2017 0946   BILITOT 0.3 11/12/2018 1156   BILITOT 0.31 06/26/2017 0946   GFRNONAA 47 (L) 11/12/2018 1156   GFRAA 54 (L) 11/12/2018 1156       Component Value Date/Time   WBC 6.7 11/12/2018 1156   WBC 7.0 09/27/2017 1417   RBC 3.74 (L) 11/12/2018 1156   HGB 11.0 (L) 11/12/2018 1156   HGB 13.4 06/26/2017 0946   HCT 33.3 (L) 11/12/2018 1156   HCT 40.8 06/26/2017 0946   PLT 196 11/12/2018 1156   PLT 236 06/26/2017 0946   MCV 89.0 11/12/2018 1156   MCV 90.7 06/26/2017 0946   MCH 29.4 11/12/2018 1156   MCHC 33.0 11/12/2018 1156   RDW 14.5 11/12/2018 1156   RDW 15.7 (H) 06/26/2017 0946   LYMPHSABS 2.1 11/12/2018 1156   LYMPHSABS 1.8 06/26/2017 0946   MONOABS 0.5 11/12/2018 1156   MONOABS 0.6 06/26/2017 0946   EOSABS 0.1 11/12/2018 1156   EOSABS 0.1 06/26/2017 0946   BASOSABS 0.0 11/12/2018 1156   BASOSABS 0.1 06/26/2017 0946   -------------------------------  Imaging from last 24 hours (if applicable):  Radiology interpretation: Dg Ribs Unilateral W/chest Right  Result Date: 11/12/2018 CLINICAL DATA:  Right rib pain after injury. History of multiple myeloma. EXAM: RIGHT RIBS AND CHEST -  3+ VIEW COMPARISON:  Radiographs of November 16, 2015. FINDINGS: No fracture or other bone lesions are seen involving the ribs. There is no evidence of pneumothorax or pleural effusion. Both lungs are clear. Heart size and mediastinal contours are within normal limits. IMPRESSION: Negative. Electronically Signed   By: Marijo Conception M.D.   On: 11/12/2018 12:54

## 2018-11-19 ENCOUNTER — Inpatient Hospital Stay (HOSPITAL_BASED_OUTPATIENT_CLINIC_OR_DEPARTMENT_OTHER): Payer: Medicare HMO | Admitting: Oncology

## 2018-11-19 ENCOUNTER — Other Ambulatory Visit: Payer: Self-pay

## 2018-11-19 ENCOUNTER — Telehealth: Payer: Self-pay | Admitting: Internal Medicine

## 2018-11-19 VITALS — BP 134/63 | HR 95 | Temp 99.2°F | Resp 18 | Ht 72.0 in | Wt 214.6 lb

## 2018-11-19 DIAGNOSIS — R0781 Pleurodynia: Secondary | ICD-10-CM

## 2018-11-19 DIAGNOSIS — D649 Anemia, unspecified: Secondary | ICD-10-CM

## 2018-11-19 DIAGNOSIS — C9001 Multiple myeloma in remission: Secondary | ICD-10-CM | POA: Diagnosis not present

## 2018-11-19 DIAGNOSIS — Z79899 Other long term (current) drug therapy: Secondary | ICD-10-CM

## 2018-11-19 NOTE — Telephone Encounter (Signed)
Called pt Stress test had been rescheduled for May 1   Now not sure office will be open Pt informed me his is waiting in oncology clinic now to be seen by Dr Osker Mason Will need to review records from onc visit and consider whether to reschedule nuclear scan once office is reopened.

## 2018-11-19 NOTE — Progress Notes (Signed)
Hematology and Oncology Follow Up Visit  James [name] [id number] 1946-01-15 73 y.o. 11/19/2018 3:13 PM James [name], MDEhinger, [name], MD   Principle Diagnosis: 73 year old man with multiple myeloma diagnosed in June 2017.  He presented with IgA kappa and 20% plasma involvement of the bone marrow.  Prior therapy:  Revlimid and dexamethasone started on January 28 2016. Revlimid at 25 mg daily for 21 days with dexamethasone at 20 mg weekly. Therapy discontinued in January 2018 after achieving a complete response.  Current therapy: Active surveillance.   Interim History:  James [name] is here for a repeat evaluation.  Since the last visit, he reports no major changes in his health.  He does report increase in his back and hip pain as well as right-sided flank pain.  X-rays obtained last week and did not show any pathological fractures.  He is scheduled to have an epidural injection in the near future.  He remains ambulatory without any recent falls or syncope.  He does report some fatigue and tiredness and occasional dyspnea on exertion.  He denies any cough or fevers.   He denied any alteration mental status, neuropathy, confusion or dizziness.  Denies any headaches or lethargy.  Denies any night sweats, weight loss or changes in appetite.  Denied orthopnea, dyspnea on exertion or chest discomfort.  Denies shortness of breath, difficulty breathing hemoptysis or cough.  Denies any abdominal distention, nausea, early satiety or dyspepsia.  Denies any hematuria, frequency, dysuria or nocturia.  Denies any skin irritation, dryness or rash.  Denies any ecchymosis or petechiae.  Denies any lymphadenopathy or clotting.  Denies any heat or cold intolerance.  Denies any anxiety or depression.  Remaining review of system is negative.  .    Medications: I have reviewed the patient's current medications.  Current Outpatient Medications  Medication Sig Dispense Refill  . amLODipine (NORVASC) 10 MG  tablet Take 10 mg by mouth every morning.     . ferrous sulfate 325 (65 FE) MG tablet Take 325 mg by mouth 2 (two) times daily with a meal.     . finasteride (PROSCAR) 5 MG tablet Take 5 mg by mouth daily.    Marland Kitchen gabapentin (NEURONTIN) 400 MG capsule Take 400 mg by mouth 3 (three) times daily.     Marland Kitchen glimepiride (AMARYL) 2 MG tablet Take 2 mg by mouth daily.    Marland Kitchen HYDROcodone-acetaminophen (NORCO) 10-325 MG tablet Take 1 tablet by mouth 3 (three) times daily.     Marland Kitchen HYDROcodone-acetaminophen (NORCO) 5-325 MG tablet Take 1 tablet by mouth every 6 (six) hours as needed. 1-2 tabs po q6 hours prn pain 15 tablet 0  . losartan (COZAAR) 50 MG tablet Take 50 mg by mouth every morning.     . metFORMIN (GLUCOPHAGE-XR) 500 MG 24 hr tablet Take 1,000 mg by mouth 2 (two) times daily.     Marland Kitchen omeprazole (PRILOSEC) 20 MG capsule Take 20 mg by mouth 2 (two) times daily before a meal.     . Tamsulosin HCl (FLOMAX) 0.4 MG CAPS Take 0.4 mg by mouth 2 (two) times daily.     . traMADol (ULTRAM) 50 MG tablet Take 50 mg by mouth 4 (four) times daily -  with meals and at bedtime. For pain     No current facility-administered medications for this visit.     Allergies:  Allergies  Allergen Reactions  . Invokana [Canagliflozin] Anaphylaxis    Facial and lip swelling    Past Medical History,  Surgical history, Social history, and Family History were reviewed and updated.   Physical Exam:  Blood pressure 134/63, pulse 95, temperature 99.2 F (37.3 C), temperature source Oral, resp. rate 18, height 6' (1.829 m), weight 214 lb 9.6 oz (97.3 kg), SpO2 98 %.    ECOG: 1   General appearance: Comfortable appearing without any discomfort Head: Normocephalic without any trauma Oropharynx: Mucous membranes are moist and pink without any thrush or ulcers. Eyes: Pupils are equal and round reactive to light. Lymph nodes: No cervical, supraclavicular, inguinal or axillary lymphadenopathy.   Heart:regular rate and rhythm.  S1  and S2 without leg edema. Lung: Clear without any rhonchi or wheezes.  No dullness to percussion. Abdomin: Soft, nontender, nondistended with good bowel sounds.  No hepatosplenomegaly. Musculoskeletal: No joint deformity or effusion.  Full range of motion noted. Neurological: No deficits noted on motor, sensory and deep tendon reflex exam. Skin: No petechial rash or dryness.  Appeared moist.     Lab Results: Lab Results  Component Value Date   WBC 6.7 11/12/2018   HGB 11.0 (L) 11/12/2018   HCT 33.3 (L) 11/12/2018   MCV 89.0 11/12/2018   PLT 196 11/12/2018     Chemistry      Component Value Date/Time   NA 140 11/12/2018 1156   NA 138 06/26/2017 0946   K 4.0 11/12/2018 1156   K 3.8 06/26/2017 0946   CL 101 11/12/2018 1156   CL 105 08/09/2012 0918   CO2 23 11/12/2018 1156   CO2 25 06/26/2017 0946   BUN 10 11/12/2018 1156   BUN 16.4 06/26/2017 0946   CREATININE 1.46 (H) 11/12/2018 1156   CREATININE 1.2 06/26/2017 0946      Component Value Date/Time   CALCIUM 9.5 11/12/2018 1156   CALCIUM 9.3 06/26/2017 0946   ALKPHOS 75 11/12/2018 1156   ALKPHOS 75 06/26/2017 0946   AST 15 11/12/2018 1156   AST 11 06/26/2017 0946   ALT 10 11/12/2018 1156   ALT 14 06/26/2017 0946   BILITOT 0.3 11/12/2018 1156   BILITOT 0.31 06/26/2017 0946     Results for James [name] (MRN [id number]) as of 11/19/2018 13:56  Ref. Range 04/12/2018 10:59 11/12/2018 11:57  M Protein SerPl Elph-Mcnc Latest Ref Range: Not Observed g/dL 2.1 (H) 3.9 (H)     Results for James [name] (MRN [id number]) as of 11/19/2018 13:56  Ref. Range 04/12/2018 10:59 11/12/2018 11:57  IgA Latest Ref Range: 61 - 437 mg/dL 2,541 (H) 4,707 (H)     Impression and Plan:  73 year old with:  1.  IgG kappa multiple myeloma diagnosed in 2017.  He presented with IgA Kappa mild anemia.   He is status post Revlimid and dexamethasone with partial response.  He has been on active surveillance since January 2018 based  on his request.  His protein studies obtained on November 12, 2018 was personally reviewed continues to show a rise in his M spike as well as IgA levels.  Risks and benefits of starting salvage therapy including Revlimid and dexamethasone was discussed today.  Complication associated with these therapies as well as alternative therapy using Velcade-based regimens were also reviewed.  After discussion today, he continues to decline therapy at this time I opted to continue active surveillance.  He understands he may develop renal as well as hematological complications but for the time being he continues to defer treatment.     2.  Chronic back and hip pain: Continues to be  an issue and unrelated to multiple myeloma.  His pain is manageable.  3.  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  His hemoglobin is down to 11 although he is minimally symptomatic.  4.  Rib pain: X-rays did not show any fractures.  5.  Follow-up: In in 3 months   25  minutes was spent with the patient face-to-face today.  More than 50% of time was spent on reviewing his disease status, laboratory data, options of therapy as well as answering questions regarding future plan of care.    Zola Button, MD 4/21/20203:13 PM

## 2018-11-20 ENCOUNTER — Telehealth: Payer: Self-pay | Admitting: Oncology

## 2018-11-20 DIAGNOSIS — M48062 Spinal stenosis, lumbar region with neurogenic claudication: Secondary | ICD-10-CM | POA: Diagnosis not present

## 2018-11-20 DIAGNOSIS — Z6832 Body mass index (BMI) 32.0-32.9, adult: Secondary | ICD-10-CM | POA: Diagnosis not present

## 2018-11-20 DIAGNOSIS — I10 Essential (primary) hypertension: Secondary | ICD-10-CM | POA: Diagnosis not present

## 2018-11-20 NOTE — Telephone Encounter (Signed)
Scheduled appt per 4/21 sch messgae.

## 2018-11-29 ENCOUNTER — Encounter (HOSPITAL_COMMUNITY): Payer: Medicare HMO

## 2018-11-29 DEATH — deceased

## 2018-12-10 ENCOUNTER — Telehealth (HOSPITAL_COMMUNITY): Payer: Self-pay

## 2018-12-10 NOTE — Telephone Encounter (Signed)
Counter complete. 

## 2018-12-11 ENCOUNTER — Other Ambulatory Visit: Payer: Self-pay

## 2018-12-11 ENCOUNTER — Ambulatory Visit (HOSPITAL_COMMUNITY)
Admission: RE | Admit: 2018-12-11 | Discharge: 2018-12-11 | Disposition: A | Discharge status: Home / Self Care | Payer: Medicare HMO | Source: Ambulatory Visit | Attending: Cardiovascular Disease | Admitting: Cardiovascular Disease

## 2018-12-11 DIAGNOSIS — R0602 Shortness of breath: Secondary | ICD-10-CM | POA: Insufficient documentation

## 2018-12-11 LAB — MYOCARDIAL PERFUSION IMAGING
LV dias vol: 125 mL (ref 62–150)
LV sys vol: 46 mL
Peak HR: 90 {beats}/min
Rest HR: 74 {beats}/min
SDS: 2
SRS: 0
SSS: 2
TID: 1.08

## 2018-12-11 MED ORDER — TECHNETIUM TC 99M TETROFOSMIN IV KIT
25.8000 | PACK | Freq: Once | INTRAVENOUS | Status: AC | PRN
  Administered 2018-12-11: 25.8 via INTRAVENOUS
  Filled 2018-12-11: qty 26

## 2018-12-11 MED ORDER — REGADENOSON 0.4 MG/5ML IV SOLN
0.4000 mg | Freq: Once | INTRAVENOUS | Status: AC
  Administered 2018-12-11: 0.4 mg via INTRAVENOUS

## 2018-12-11 MED ORDER — TECHNETIUM TC 99M TETROFOSMIN IV KIT
8.6000 | PACK | Freq: Once | INTRAVENOUS | Status: AC | PRN
  Administered 2018-12-11: 8.6 via INTRAVENOUS
  Filled 2018-12-11: qty 9

## 2018-12-18 ENCOUNTER — Telehealth: Payer: Self-pay | Admitting: *Deleted

## 2018-12-18 NOTE — Telephone Encounter (Signed)
''  [name]. [phone]).  Believe I've decided to receive chemotherapy.  I have questions and need more information.    Everything is elevated.  Lots of pain running in my back but it's different.  Worse than the last time.  Pain elevating to where it's hard to move or do anything.  I almost needed someone to push me around in a wheelchair again.  I cannot sleep in bed at night.    Pain moves all over my body on all sides.     Now listen, I do not have trouble moving my bowels, urinating or walking.  Had numbness and tingling all the time anyway from neuropathy before my cancer.  I'm on the porch right now.  Hurts to get up to standing position but able to move since I started Fentanyl patches I had on hand.    Wife placed two, 25 mcg patches today.  Wearing one 25 mcg did not help.  Have three more left and one 50 mcg patch.  Used patches back in 2018.  Already using Hydrocodone daily.      No, I am not going to the ED.  I need to speak with him about treatment.  Will chemotherapy help the pain?  Can I receive the same treatment at home like before?"

## 2018-12-19 ENCOUNTER — Telehealth: Payer: Self-pay

## 2018-12-19 ENCOUNTER — Other Ambulatory Visit: Payer: Self-pay | Admitting: Oncology

## 2018-12-19 ENCOUNTER — Telehealth: Payer: Self-pay | Admitting: Pharmacist

## 2018-12-19 DIAGNOSIS — C9001 Multiple myeloma in remission: Secondary | ICD-10-CM

## 2018-12-19 MED ORDER — LENALIDOMIDE 15 MG PO CAPS: 15.0000 mg | ORAL_CAPSULE | Freq: Every day | ORAL | 0 refills | Status: DC

## 2018-12-19 MED ORDER — DEXAMETHASONE 4 MG PO TABS: ORAL_TABLET | ORAL | 3 refills | Status: DC

## 2018-12-19 NOTE — Telephone Encounter (Signed)
Oral Chemotherapy Pharmacist Encounter   I spoke with patient for overview of: Revlimid (lenalidomide) for the treatment of multiple myeloma in relaspse in conjunction with dexamethasone, planned duration until adequate disease control or unacceptable toxicity.  Counseled patient on administration, dosing, side effects, monitoring, drug-food interactions, safe handling, storage, and disposal.  Patient will take Revlimid '15mg'$  capsules, 1 capsule by mouth once daily, without regard to food, with a full glass of water.  Revlimid will be given 21 days on, 7 days off, repeat every 28 days.  Patient will take dexamethasone '4mg'$  tablets, 5 tablets ('20mg'$ ) by mouth once weekly with breakfast.  Revlimid start date: TBD, pending medication acquisition, week of 12/23/18  Adverse effects of Revlimid include but are not limited to: nausea, constipation, diarrhea, abdominal pain, rash, fatigue, drug fever, and decreased blood counts.    Reviewed with patient importance of keeping a medication schedule and plan for any missed doses.  Medication reconciliation performed and medication/allergy list updated.  Patient is picking up his dexamethasone prescription from [facility], he knows to take it early in the day and with food once weekly.  Patient will obtain a supply of aspirin 81 mg tablets and will take the supportive therapy medication once daily once starting on Revlimid for thromboprophylaxis.  Insurance authorization for Revlimid has been obtained.  Patient informed that Revlimid prescription has been sent to [facility] as he was receiving it before. Patient informed that [facility] pharmacy will be able to obtain a copayment grant on his behalf if his Revlimid copayment is unaffordable.  All questions answered.  Mr. Brassfield voiced understanding and appreciation.   Patient knows to call the office with questions or concerns.  Johny Drilling, PharmD, BCPS,  BCOP  12/19/2018    4:27 PM Oral Oncology Clinic [phone]

## 2018-12-19 NOTE — Telephone Encounter (Signed)
Oral Oncology Patient Advocate Encounter  Received notification from [facility] that prior authorization for Revlimid is required.  PA submitted on CoverMyMeds Key [id number] Status is pending  Oral Oncology Clinic will continue to follow.  [place] Patient [place] Phone [phone] Fax [phone] 12/19/2018    3:16 PM

## 2018-12-19 NOTE — Telephone Encounter (Signed)
Oral Oncology Pharmacist Encounter  Per discussion with MD, aspirin 81mg  once daily will be used for thromboprophylaxis.  I will inform patient to start on this supportive therapy medication when he starts back on his Revlimid.  An agent for VZV prophylaxis will not be initiated at this time.  Johny Drilling, PharmD, BCPS, BCOP  12/19/2018 11:38 AM Oral Oncology Clinic [phone]

## 2018-12-19 NOTE — Telephone Encounter (Signed)
Oral Oncology Pharmacist Encounter  Received new prescription for Revlimid (lenalidomide) for the treatment of multiple myeloma in relaspse in conjunction with dexamethasone, planned duration until adequate disease control or unacceptable toxicity.  Original diagnosis in June 2017 Patient received Revlimid plus dexamethasone June 2017-Jan 2018 after having achieved a complete response. He has been maintained on active surveillance since that time. Multiple myeloma panel is showing signs continued progression. Patient is under evaluation to initiate treatment with Revlimid '15mg'$  once daily for 21 days on, 7 days off, repeat every 28 days. Dexamethasone will be administered at '20mg'$  by mouth once weekly.  Labs from 11/12/18 assessed, OK for treatment initiation. SCr=1.46, est CrCl ~ 60 mL/min MD had decreased Revlimid dose due to reduced renal function  Current medication list in Epic reviewed, no DDIs with Revlimid identified.  Message sent to MD regarding initiation of aspirin '81mg'$  once daily for thromboprophylaxis and acyclovir '400mg'$  once daily for VZV prophylaxis. Risk of shingles reactivation not as pronounced with this regimen when compared with the use of a proteosome inhibitor, however cell-mediated immunity is decreased due to multiple myeloma.  Prescription for Revlimid will be e-scribed to appropriate specialty pharmacy for dispensing once insurance authorization is approved and [employer] authorization number is obtained. Revlimid is not available for dispensing at the [facility] as it is a limited distribution medication.  Message has been sent to collaborative practice RN to obtain [employer] authorization number.  Oral Oncology Clinic will continue to follow for insurance authorization, copayment issues, initial counseling and start date.  Johny Drilling, PharmD, BCPS, BCOP  12/19/2018 10:20 AM Oral Oncology Clinic [phone]

## 2018-12-19 NOTE — Telephone Encounter (Signed)
Oral Oncology Patient Advocate Encounter  Prior Authorization for Revlimid has been approved.    PA# [id number] Effective dates: 07/29/18 through 07/31/19  Oral Oncology Clinic will continue to follow.   [place] Patient [place] Phone [phone] Fax [phone] 12/19/2018    3:39 PM

## 2018-12-19 NOTE — Telephone Encounter (Signed)
Patient called and discussed treatment plan.

## 2018-12-19 NOTE — Telephone Encounter (Signed)
Oral Oncology Pharmacist [employer] authorization and [employer] authorization obtained. Revlimid 15 mg capsule prescription, take 1 capsule by mouth once daily for 21 days on, 7 days off, repeat every 28 days, quantity #21, refills = 0, has been E scribed to [facility] pharmacy as this is the pharmacy that patient previously used for his Revlimid dispensing.  Supporting information such as demographics, insurance card, insurance authorization, and medication list have been faxed to [facility].  Johny Drilling, PharmD, BCPS, BCOP  12/19/2018 3:51 PM Oral Oncology Clinic [phone]

## 2018-12-19 NOTE — Progress Notes (Signed)
I discussed with the patient is which is to restart treatment.  Risks and benefits of restarting Revlimid was discussed today in detail.  I recommended proceeding with a lower dose of 15 mg daily with dexamethasone 20 mg weekly.  Complication associated with this treatment include nausea, fatigue, skin rash among others.  He will start dexamethasone 20 mg weekly in the immediate future given his increased pain and Revlimid will start it once it is available.  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  I instructed him to contact the pain clinic or he has been getting his pain medication regularly. 

## 2018-12-20 ENCOUNTER — Telehealth: Payer: Self-pay

## 2018-12-20 NOTE — Telephone Encounter (Signed)
Oral Oncology Patient Advocate Encounter  Revlimid copay is [phone], this is not affordable for the patient.  Was successful in securing patient a $11,000 grant from [employer] to provide copayment coverage for Revlimid. This will keep the out of pocket expense at $0.   I have spoken with the patient..   The billing information is as follows and has been shared with [facility] pharmacy.   Member ID: [id number] Group ID: [id number] RxBin: [id number] Dates of Eligibility: 11/20/18 through 11/19/18  [place] Patient [place] Phone [phone] Fax [phone] 12/20/2018    11:37 AM

## 2018-12-24 NOTE — Telephone Encounter (Signed)
Oral Oncology Patient Advocate Encounter  Confirmed with [facility] that Revlimid was shipped to the patient on 12/20/18 with a $0 copay using the [employer].  [place] Patient [name] Phone [phone] Fax [phone] 12/24/2018   8:18 AM

## 2019-01-01 DIAGNOSIS — M5136 Other intervertebral disc degeneration, lumbar region: Secondary | ICD-10-CM | POA: Diagnosis not present

## 2019-01-01 DIAGNOSIS — G894 Chronic pain syndrome: Secondary | ICD-10-CM | POA: Diagnosis not present

## 2019-01-01 DIAGNOSIS — C9002 Multiple myeloma in relapse: Secondary | ICD-10-CM | POA: Diagnosis not present

## 2019-01-01 DIAGNOSIS — E1142 Type 2 diabetes mellitus with diabetic polyneuropathy: Secondary | ICD-10-CM | POA: Diagnosis not present

## 2019-01-01 NOTE — Progress Notes (Signed)
Virtual Visit via Video Note   This visit type was conducted due to national recommendations for restrictions regarding the COVID-19 Pandemic (e.g. social distancing) in an effort to limit this patient's exposure and mitigate transmission in our community.  Due to his co-morbid illnesses, this patient is at least at moderate risk for complications without adequate follow up.  This format is felt to be most appropriate for this patient at this time.  All issues noted in this document were discussed and addressed.  A limited physical exam was performed with this format.  Please refer to the patient's chart for his consent to telehealth for [facility].   Date:  01/02/2019   ID:  Kevin Lang, DOB Dec 29, 1945, MRN [id number]  Patient Location: Home Provider Location: Office  PCP:  Cathlyn Parsons, PA-C  Cardiologist:  Shirlee More, MD  Electrophysiologist:  None   Evaluation Performed:  Follow-Up Visit  Chief Complaint:  Heart failure  History of Present Illness:    Kevin Lang is a 73 y.o. 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 He was started on Entresto and developed angioedema and stopped.   He was last seen by me 04/02/2018.  He was placed on vasodilator therapy with hydralazine and isosorbide   Even with my encouragement he will not check home blood pressures and will not weigh himself.  In terms of heart failure he is stable he has shortness of breath when he is working using upper extremities are walking a longer distance stable New York Heart Association class II his daughter is very helpful was present and tells me he has no edema.  He is compliant with his medications and we will move ahead and do the echocardiogram to make a decision if ICD therapy is  warranted.  He is overdue and in the office we will check labs including CBC comprehensive panel proBNP for heart failure and lipid profile.  He asked for a copy to go to the [facility] nephrologist and I asked him to bring the name and contact information to the office when he has lab work.  I asked him to please start checking blood pressure and weight at home he will not I will plan to see him in the office in 3 months or sooner if we need to have a discussion of ICD therapy.  He has no orthopnea edema chest pain palpitation or syncope and overall he is feeling better and his shortness of breath is not progressed  The patient does not have symptoms concerning for COVID-19 infection (fever, chills, cough, or new shortness of breath).    Past Medical History:  Diagnosis Date   Aneurysm of thoracic aorta ([place]) 02/16/2017   Overview:  Cardiac cath 12/08/15: Conclusions Diagnostic Procedure Summary 1. Mild non-obstructive CAD 2. Normal LV systolic function 3. Aortogram performed to eval aortic root aneurysm, 4.0 cm by this study.  CT Jan 2017, ascending aaorta aneurysm, 4.8 cm IMOUPDATE   Benign essential hypertension 02/16/2017   Cardiomyopathy ([place]) 08/23/2015   Chronic anticoagulation 05/21/2016   Chronic combined systolic and diastolic congestive heart failure ([place]) 02/16/2017   CKD (chronic kidney disease) stage 3, GFR 30-59 ml/min (HCC) 12/31/2015   COPD (chronic obstructive pulmonary disease) ([place]) 05/21/2016   Mild CAD 05/21/2016   Overview:  Cardiac cath 12/08/15:Conclusions Diagnostic Procedure Summary 1. Mild non-obstructive CAD 2. Normal LV systolic function 3. Aortogram performed to eval aortic root aneurysm, 4.0 cm by this study. Diagnostic Procedure Recommendations   Persistent atrial fibrillation 02/16/2017   Past Surgical History:  Procedure Laterality Date   CORONARY ANGIOPLASTY       No outpatient medications have been marked as taking for the 01/02/19  encounter (Appointment) with Richardo Priest, MD.     Allergies:   Delene Loll [sacubitril-valsartan]   Social History   Tobacco Use   Smoking status: Former Smoker    Packs/day: 0.50    Years: 30.00    Pack years: 15.00    Types: Cigarettes    Last attempt to quit: 08/22/2000    Years since quitting: 18.3   Smokeless tobacco: Never Used  Substance Use Topics   Alcohol use: No   Drug use: No     Family Hx: The patient's family history includes Aneurysm in his father; Heart disease in his brother; Hypertension in his mother; Stroke in his mother.  ROS:   Please see the history of present illness.     All other systems reviewed and are negative.   Prior CV studies:   The following studies were reviewed today:    Labs/Other Tests and Data Reviewed:    EKG:  No ECG reviewed.  Recent Labs:   Ref Range & Units 45moago 675mogo 76m54moo 41m86mo 10yr 73yr NT-Pro BNP 0 - 376 pg/mL 2,751High   2,216High  CM 3,692High  CM 1,261High  CM 992High    10/03/2018: ALT 27; BUN 28; Creatinine, Ser 1.67; Hemoglobin 16.9; NT-Pro BNP 2,751; Platelets 240; Potassium 4.5; Sodium 143   Recent Lipid Panel Lab Results  Component Value Date/Time   CHOL 138 10/03/2018 10:37 AM   TRIG 102 10/03/2018 10:37 AM   HDL 46 10/03/2018 10:37 AM   CHOLHDL 3.0 10/03/2018 10:37 AM   LDLCALC 72 10/03/2018 10:37 AM    Wt Readings from Last 3 Encounters:  10/03/18 233 lb 9.6 oz (106 kg)  06/11/18 236 lb (107 kg)  04/30/18 233 lb 3.2 oz (105.8 kg)     Objective:    Vital Signs:  There were no vitals taken for this visit.   VITAL SIGNS:  reviewed GEN:  no acute distress EYES:  sclerae anicteric, EOMI - Extraocular Movements Intact RESPIRATORY:  normal respiratory effort, symmetric expansion CARDIOVASCULAR:  no peripheral edema SKIN:  no rash, lesions or ulcers. MUSCULOSKELETAL:  no obvious deformities. NEURO:  alert and oriented x 3, no obvious focal deficit PSYCH:  normal  affect  ASSESSMENT & PLAN:    1.  Heart failure stable New York Heart Association class II continue current treatment diuretic beta-blocker optimal dose and vasodilator therapy optimal dose recheck echocardiogram and likely will need an ICD.  Check labs for safety renal function proBNP continue spironolactone and watching his renal function and potassium closely with CKD Atrial fibrillation stable continue his anticoagulant beta-blocker Cardiomyopathy severe recheck echo likely needs ICD [facility] Hyperlipidemia stable continue atorvastatin check lipid profile and liver function   COVID-19 Education: The signs and symptoms of COVID-19 were discussed with the patient and how to seek care for testing (follow up with PCP or arrange E-visit).  The importance of social distancing was discussed today.  Time:   Today, I have spent 25 minutes with the patient with telehealth technology discussing the  above problems.     Medication Adjustments/Labs and Tests Ordered: Current medicines are reviewed at length with the patient today.  Concerns regarding medicines are outlined above.   Tests Ordered: No orders of the defined types were placed in this encounter.   Medication Changes: No orders of the defined types were placed in this encounter.   Disposition:  Follow up in 3 month(s)  Signed, Shirlee More, MD  01/02/2019 8:24 AM    [facility]

## 2019-01-02 ENCOUNTER — Other Ambulatory Visit: Payer: Self-pay

## 2019-01-02 ENCOUNTER — Encounter: Payer: Self-pay | Admitting: Cardiology

## 2019-01-02 ENCOUNTER — Telehealth: Payer: Self-pay | Admitting: *Deleted

## 2019-01-02 ENCOUNTER — Telehealth (INDEPENDENT_AMBULATORY_CARE_PROVIDER_SITE_OTHER): Payer: Medicare PPO | Admitting: Cardiology

## 2019-01-02 ENCOUNTER — Telehealth: Payer: Self-pay | Admitting: Cardiology

## 2019-01-02 VITALS — BP 120/84

## 2019-01-02 DIAGNOSIS — I429 Cardiomyopathy, unspecified: Secondary | ICD-10-CM | POA: Diagnosis not present

## 2019-01-02 DIAGNOSIS — Z7901 Long term (current) use of anticoagulants: Secondary | ICD-10-CM

## 2019-01-02 DIAGNOSIS — E785 Hyperlipidemia, unspecified: Secondary | ICD-10-CM

## 2019-01-02 DIAGNOSIS — I4821 Permanent atrial fibrillation: Secondary | ICD-10-CM

## 2019-01-02 DIAGNOSIS — I5042 Chronic combined systolic (congestive) and diastolic (congestive) heart failure: Secondary | ICD-10-CM

## 2019-01-02 NOTE — Telephone Encounter (Signed)
Virtual Visit Pre-Appointment Phone Call  "(Name), I am calling you today to discuss your upcoming appointment. We are currently trying to limit exposure to the virus that causes COVID-19 by seeing patients at home rather than in the office."  1. "What is the BEST phone number to call the day of the visit?" - include this in appointment notes  2. Do you have or have access to (through a family member/friend) a smartphone with video capability that we can use for your visit?" a. If yes - list this number in appt notes as cell (if different from BEST phone #) and list the appointment type as a VIDEO visit in appointment notes b. If no - list the appointment type as a PHONE visit in appointment notes  3. Confirm consent - "In the setting of the current Covid19 crisis, you are scheduled for a (phone or video) visit with your provider on (date) at (time).  Just as we do with many in-office visits, in order for you to participate in this visit, we must obtain consent.  If you'd like, I can send this to your mychart (if signed up) or email for you to review.  Otherwise, I can obtain your verbal consent now.  All virtual visits are billed to your insurance company just like a normal visit would be.  By agreeing to a virtual visit, we'd like you to understand that the technology does not allow for your provider to perform an examination, and thus may limit your provider's ability to fully assess your condition. If your provider identifies any concerns that need to be evaluated in person, we will make arrangements to do so.  Finally, though the technology is pretty good, we cannot assure that it will always work on either your or our end, and in the setting of a video visit, we may have to convert it to a phone-only visit.  In either situation, we cannot ensure that we have a secure connection.  Are you willing to proceed?" STAFF: Did the patient verbally acknowledge consent to telehealth visit? Document  YES/NO here: Yes  4. Advise patient to be prepared - "Two hours prior to your appointment, go ahead and check your blood pressure, pulse, oxygen saturation, and your weight (if you have the equipment to check those) and write them all down. When your visit starts, your provider will ask you for this information. If you have an Apple Watch or Kardia device, please plan to have heart rate information ready on the day of your appointment. Please have a pen and paper handy nearby the day of the visit as well."  5. Give patient instructions for MyChart download to smartphone OR Doximity/Doxy.me as below if video visit (depending on what platform provider is using)  6. Inform patient they will receive a phone call 15 minutes prior to their appointment time (may be from unknown caller ID) so they should be prepared to answer    TELEPHONE CALL NOTE  Kevin Lang has been deemed a candidate for a follow-up tele-health visit to limit community exposure during the Covid-19 pandemic. I spoke with the patient via phone to ensure availability of phone/video source, confirm preferred email & phone number, and discuss instructions and expectations.  I reminded Kevin Lang to be prepared with any vital sign and/or heart rhythm information that could potentially be obtained via home monitoring, at the time of his visit. I reminded Kevin Lang to expect a phone call prior to  his visit.  Calla Kicks 01/02/2019 9:24 AM   INSTRUCTIONS FOR DOWNLOADING THE MYCHART APP TO SMARTPHONE  - The patient must first make sure to have activated MyChart and know their login information - If Apple, go to [employer] and type in MyChart in the search bar and download the app. If Android, ask patient to go to [employer] and type in [place] in the search bar and download the app. The app is free but as with any other app downloads, their phone may require them to verify saved payment information or Apple/Android  password.  - The patient will need to then log into the app with their MyChart username and password, and select [place] as their healthcare provider to link the account. When it is time for your visit, go to the MyChart app, find appointments, and click Begin Video Visit. Be sure to Select Allow for your device to access the Microphone and Camera for your visit. You will then be connected, and your provider will be with you shortly.  **If they have any issues connecting, or need assistance please contact MyChart service desk [phone])**  **If using a computer, in order to ensure the best quality for their visit they will need to use either of the following Internet Browsers: [employer], or Google Chrome**  IF USING DOXIMITY or DOXY.ME - The patient will receive a link just prior to their visit by text.     FULL LENGTH CONSENT FOR TELE-HEALTH VISIT   I hereby voluntarily request, consent and authorize [facility] and its employed or contracted physicians, physician assistants, nurse practitioners or other licensed health care professionals (the Practitioner), to provide me with telemedicine health care services (the Services") as deemed necessary by the treating Practitioner. I acknowledge and consent to receive the Services by the Practitioner via telemedicine. I understand that the telemedicine visit will involve communicating with the Practitioner through live audiovisual communication technology and the disclosure of certain medical information by electronic transmission. I acknowledge that I have been given the opportunity to request an in-person assessment or other available alternative prior to the telemedicine visit and am voluntarily participating in the telemedicine visit.  I understand that I have the right to withhold or withdraw my consent to the use of telemedicine in the course of my care at any time, without affecting my right to future care or treatment,  and that the Practitioner or I may terminate the telemedicine visit at any time. I understand that I have the right to inspect all information obtained and/or recorded in the course of the telemedicine visit and may receive copies of available information for a reasonable fee.  I understand that some of the potential risks of receiving the Services via telemedicine include:   Delay or interruption in medical evaluation due to technological equipment failure or disruption;  Information transmitted may not be sufficient (e.g. poor resolution of images) to allow for appropriate medical decision making by the Practitioner; and/or   In rare instances, security protocols could fail, causing a breach of personal health information.  Furthermore, I acknowledge that it is my responsibility to provide information about my medical history, conditions and care that is complete and accurate to the best of my ability. I acknowledge that Practitioner's advice, recommendations, and/or decision may be based on factors not within their control, such as incomplete or inaccurate data provided by me or distortions of diagnostic images or specimens that may result from electronic transmissions. I  understand that the practice of medicine is not an exact science and that Practitioner makes no warranties or guarantees regarding treatment outcomes. I acknowledge that I will receive a copy of this consent concurrently upon execution via email to the email address I last provided but may also request a printed copy by calling the office of [place].    I understand that my insurance will be billed for this visit.   I have read or had this consent read to me.  I understand the contents of this consent, which adequately explains the benefits and risks of the Services being provided via telemedicine.   I have been provided ample opportunity to ask questions regarding this consent and the Services and have had my questions  answered to my satisfaction.  I give my informed consent for the services to be provided through the use of telemedicine in my medical care  By participating in this telemedicine visit I agree to the above.

## 2019-01-02 NOTE — Patient Instructions (Addendum)
Medication Instructions:  Your physician recommends that you continue on your current medications as directed. Please refer to the Current Medication list given to you today.  If you need a refill on your cardiac medications before your next appointment, please call your pharmacy.   Lab work: Your physician recommends that you return for lab work within the next week: ProBNP, CMP, CBC, lipid panel. Please go to the [place] office for lab work, no appointment needed. Please fast beforehand.  If you have labs (blood work) drawn today and your tests are completely normal, you will receive your results only by: Marland Kitchen MyChart Message (if you have MyChart) OR . A paper copy in the mail If you have any lab test that is abnormal or we need to change your treatment, we will call you to review the results.  Testing/Procedures: Your physician has requested that you have an echocardiogram. Echocardiography is a painless test that uses sound waves to create images of your heart. It provides your doctor with information about the size and shape of your heart and how well your heart's chambers and valves are working. This procedure takes approximately one hour. There are no restrictions for this procedure. This will be completed in the [place] office on Friday, 01/24/2019, at 11:15 am.    Follow-Up: At [facility], you and your health needs are our priority.  As part of our continuing mission to provide you with exceptional heart care, we have created designated Provider Care Teams.  These Care Teams include your primary Cardiologist (physician) and Advanced Practice Providers (APPs -  Physician Assistants and Nurse Practitioners) who all work together to provide you with the care you need, when you need it. You will need a follow up appointment in 3 months: Thursday, 04/03/2019, at 8:00 am in the [place] office.       Echocardiogram An echocardiogram is a procedure that uses painless sound waves  (ultrasound) to produce an image of the heart. Images from an echocardiogram can provide important information about:  Signs of coronary artery disease (CAD).  Aneurysm detection. An aneurysm is a weak or damaged part of an artery wall that bulges out from the normal force of blood pumping through the body.  Heart size and shape. Changes in the size or shape of the heart can be associated with certain conditions, including heart failure, aneurysm, and CAD.  Heart muscle function.  Heart valve function.  Signs of a past heart attack.  Fluid buildup around the heart.  Thickening of the heart muscle.  A tumor or infectious growth around the heart valves. Tell a health care provider about:  Any allergies you have.  All medicines you are taking, including vitamins, herbs, eye drops, creams, and over-the-counter medicines.  Any blood disorders you have.  Any surgeries you have had.  Any medical conditions you have.  Whether you are pregnant or may be pregnant. What are the risks? Generally, this is a safe procedure. However, problems may occur, including:  Allergic reaction to dye (contrast) that may be used during the procedure. What happens before the procedure? No specific preparation is needed. You may eat and drink normally. What happens during the procedure?   An IV tube may be inserted into one of your veins.  You may receive contrast through this tube. A contrast is an injection that improves the quality of the pictures from your heart.  A gel will be applied to your chest.  A wand-like tool (transducer) will be moved over  your chest. The gel will help to transmit the sound waves from the transducer.  The sound waves will harmlessly bounce off of your heart to allow the heart images to be captured in real-time motion. The images will be recorded on a computer. The procedure may vary among health care providers and hospitals. What happens after the procedure?   You may return to your normal, everyday life, including diet, activities, and medicines, unless your health care provider tells you not to do that. Summary  An echocardiogram is a procedure that uses painless sound waves (ultrasound) to produce an image of the heart.  Images from an echocardiogram can provide important information about the size and shape of your heart, heart muscle function, heart valve function, and fluid buildup around your heart.  You do not need to do anything to prepare before this procedure. You may eat and drink normally.  After the echocardiogram is completed, you may return to your normal, everyday life, unless your health care provider tells you not to do that. This information is not intended to replace advice given to you by your health care provider. Make sure you discuss any questions you have with your health care provider. Document Released: 07/14/2000 Document Revised: 08/19/2016 Document Reviewed: 08/19/2016 Elsevier Interactive Patient Education  2019 [employer].

## 2019-01-02 NOTE — Telephone Encounter (Signed)
Daughter called to give Dr. Dulce Sellar Pt's BP: 120/84

## 2019-01-03 ENCOUNTER — Encounter: Payer: Self-pay | Admitting: Cardiology

## 2019-01-03 NOTE — Telephone Encounter (Signed)
FYI. Patient called back with BP readings for yesterday's virtual visit. Thanks!

## 2019-01-03 NOTE — Progress Notes (Signed)
His daughter called back to our office she did his blood pressure after the visit 120/84

## 2019-01-10 LAB — CBC
Hematocrit: 50.2 % (ref 37.5–51.0)
Hemoglobin: 17 g/dL (ref 13.0–17.7)
MCH: 31.8 pg (ref 26.6–33.0)
MCHC: 33.9 g/dL (ref 31.5–35.7)
MCV: 94 fL (ref 79–97)
Platelets: 211 10*3/uL (ref 150–450)
RBC: 5.34 x10E6/uL (ref 4.14–5.80)
RDW: 13.7 % (ref 11.6–15.4)
WBC: 7.5 10*3/uL (ref 3.4–10.8)

## 2019-01-10 LAB — LIPID PANEL
Chol/HDL Ratio: 3.3 ratio (ref 0.0–5.0)
Cholesterol, Total: 139 mg/dL (ref 100–199)
HDL: 42 mg/dL (ref >39)
LDL Calculated: 71 mg/dL (ref 0–99)
Triglycerides: 132 mg/dL (ref 0–149)
VLDL Cholesterol Cal: 26 mg/dL (ref 5–40)

## 2019-01-10 LAB — COMPREHENSIVE METABOLIC PANEL
ALT: 20 IU/L (ref 0–44)
AST: 20 IU/L (ref 0–40)
Albumin/Globulin Ratio: 1.9 (ref 1.2–2.2)
Albumin: 4 g/dL (ref 3.7–4.7)
Alkaline Phosphatase: 82 IU/L (ref 39–117)
BUN/Creatinine Ratio: 16 (ref 10–24)
BUN: 32 mg/dL — ABNORMAL HIGH (ref 8–27)
Bilirubin Total: 0.6 mg/dL (ref 0.0–1.2)
CO2: 24 mmol/L (ref 20–29)
Calcium: 9.1 mg/dL (ref 8.6–10.2)
Chloride: 103 mmol/L (ref 96–106)
Creatinine, Ser: 2 mg/dL — ABNORMAL HIGH (ref 0.76–1.27)
GFR calc Af Amer: 37 mL/min/{1.73_m2} — ABNORMAL LOW (ref >59)
GFR calc non Af Amer: 32 mL/min/{1.73_m2} — ABNORMAL LOW (ref >59)
Globulin, Total: 2.1 g/dL (ref 1.5–4.5)
Glucose: 111 mg/dL — ABNORMAL HIGH (ref 65–99)
Potassium: 4 mmol/L (ref 3.5–5.2)
Sodium: 144 mmol/L (ref 134–144)
Total Protein: 6.1 g/dL (ref 6.0–8.5)

## 2019-01-10 LAB — PRO B NATRIURETIC PEPTIDE: NT-Pro BNP: 1743 pg/mL — ABNORMAL HIGH (ref 0–376)

## 2019-01-14 ENCOUNTER — Other Ambulatory Visit: Payer: Self-pay

## 2019-01-14 DIAGNOSIS — C9001 Multiple myeloma in remission: Secondary | ICD-10-CM

## 2019-01-14 MED ORDER — LENALIDOMIDE 15 MG PO CAPS: 15.0000 mg | ORAL_CAPSULE | Freq: Every day | ORAL | 0 refills | Status: DC

## 2019-01-17 ENCOUNTER — Telehealth: Payer: Self-pay

## 2019-01-17 NOTE — Telephone Encounter (Signed)
Called pt to go over [place] screening questions in preparation for his upcoming appointment scheduled for 6/26. Pt state he would like to cancel appointment as he is currently taking chemo. Offered pt to reschedule but he decline and state he was told his stress test was normal and don't feel like he need to follow up.

## 2019-01-24 ENCOUNTER — Ambulatory Visit: Payer: Medicare HMO | Admitting: Cardiology

## 2019-01-24 ENCOUNTER — Other Ambulatory Visit: Payer: Medicare PPO

## 2019-01-27 ENCOUNTER — Other Ambulatory Visit: Payer: Self-pay | Admitting: *Deleted

## 2019-01-27 ENCOUNTER — Telehealth: Payer: Self-pay | Admitting: *Deleted

## 2019-01-27 DIAGNOSIS — C9001 Multiple myeloma in remission: Secondary | ICD-10-CM

## 2019-01-27 NOTE — Telephone Encounter (Signed)
Received call from patient this am stating that his pain is increasing from his back to his sides, shoulders, lower chest.  He states he cannot sleep in his bed d/t the pain, can't roll over to get out of bed easily. He is having difficulty getting to the bathroom in time as well. He is seen in a pain clinic.  He is asking for a hospital bed, BSC-hoping these will help. Pt was seen here last in April. He states his pain is much worse than it was in April.  He has started his Revlimid and decadron for his Multiple Myeloma in May. Informed pt that we could not adjust his pain meds as he is seen in the pain clinic. He voices understanding about that but he seemed to also focus on DME that would be helpful.  Please advise. Home Health referral?

## 2019-01-27 NOTE — Telephone Encounter (Signed)
Referral made for homeHealth servcies for skilled nurse eval for DME, pain management and PT Call made to patient to make him aware.

## 2019-01-27 NOTE — Telephone Encounter (Signed)
Yes for Home health referral.

## 2019-02-03 ENCOUNTER — Telehealth: Payer: Self-pay

## 2019-02-03 DIAGNOSIS — G8929 Other chronic pain: Secondary | ICD-10-CM | POA: Diagnosis not present

## 2019-02-03 DIAGNOSIS — I2510 Atherosclerotic heart disease of native coronary artery without angina pectoris: Secondary | ICD-10-CM | POA: Diagnosis not present

## 2019-02-03 DIAGNOSIS — E119 Type 2 diabetes mellitus without complications: Secondary | ICD-10-CM | POA: Diagnosis not present

## 2019-02-03 DIAGNOSIS — M549 Dorsalgia, unspecified: Secondary | ICD-10-CM | POA: Diagnosis not present

## 2019-02-03 DIAGNOSIS — Z7984 Long term (current) use of oral hypoglycemic drugs: Secondary | ICD-10-CM | POA: Diagnosis not present

## 2019-02-03 DIAGNOSIS — I10 Essential (primary) hypertension: Secondary | ICD-10-CM | POA: Diagnosis not present

## 2019-02-03 DIAGNOSIS — Z9181 History of falling: Secondary | ICD-10-CM | POA: Diagnosis not present

## 2019-02-03 DIAGNOSIS — C9002 Multiple myeloma in relapse: Secondary | ICD-10-CM | POA: Diagnosis not present

## 2019-02-03 NOTE — Telephone Encounter (Signed)
Received call from [facility] with [facility] requesting visit orders for [facility] for 1 weeks, then 1x/week for 4 weeks, HH aide 1x/week for 2 weeks, and PT eval. Verbal order provided for requested services. Glenard Haring RN also stated that she is ordering a hospital bed, BSC, and walker.

## 2019-02-04 DIAGNOSIS — Z7984 Long term (current) use of oral hypoglycemic drugs: Secondary | ICD-10-CM | POA: Diagnosis not present

## 2019-02-04 DIAGNOSIS — C9002 Multiple myeloma in relapse: Secondary | ICD-10-CM | POA: Diagnosis not present

## 2019-02-04 DIAGNOSIS — I10 Essential (primary) hypertension: Secondary | ICD-10-CM | POA: Diagnosis not present

## 2019-02-04 DIAGNOSIS — M549 Dorsalgia, unspecified: Secondary | ICD-10-CM | POA: Diagnosis not present

## 2019-02-04 DIAGNOSIS — Z9181 History of falling: Secondary | ICD-10-CM | POA: Diagnosis not present

## 2019-02-04 DIAGNOSIS — E119 Type 2 diabetes mellitus without complications: Secondary | ICD-10-CM | POA: Diagnosis not present

## 2019-02-04 DIAGNOSIS — G8929 Other chronic pain: Secondary | ICD-10-CM | POA: Diagnosis not present

## 2019-02-04 DIAGNOSIS — I2510 Atherosclerotic heart disease of native coronary artery without angina pectoris: Secondary | ICD-10-CM | POA: Diagnosis not present

## 2019-02-05 ENCOUNTER — Telehealth: Payer: Self-pay

## 2019-02-05 NOTE — Telephone Encounter (Signed)
Received message from [facility] PT requesting okay for PT visits 1x/week form 4 weeks for strengthening, safety, and education on use of new DME. Verbal order given.

## 2019-02-06 DIAGNOSIS — C9002 Multiple myeloma in relapse: Secondary | ICD-10-CM | POA: Diagnosis not present

## 2019-02-06 DIAGNOSIS — Z7984 Long term (current) use of oral hypoglycemic drugs: Secondary | ICD-10-CM | POA: Diagnosis not present

## 2019-02-06 DIAGNOSIS — M549 Dorsalgia, unspecified: Secondary | ICD-10-CM | POA: Diagnosis not present

## 2019-02-06 DIAGNOSIS — I10 Essential (primary) hypertension: Secondary | ICD-10-CM | POA: Diagnosis not present

## 2019-02-06 DIAGNOSIS — E119 Type 2 diabetes mellitus without complications: Secondary | ICD-10-CM | POA: Diagnosis not present

## 2019-02-06 DIAGNOSIS — G8929 Other chronic pain: Secondary | ICD-10-CM | POA: Diagnosis not present

## 2019-02-06 DIAGNOSIS — I2510 Atherosclerotic heart disease of native coronary artery without angina pectoris: Secondary | ICD-10-CM | POA: Diagnosis not present

## 2019-02-06 DIAGNOSIS — Z9181 History of falling: Secondary | ICD-10-CM | POA: Diagnosis not present

## 2019-02-11 DIAGNOSIS — Z7984 Long term (current) use of oral hypoglycemic drugs: Secondary | ICD-10-CM | POA: Diagnosis not present

## 2019-02-11 DIAGNOSIS — E119 Type 2 diabetes mellitus without complications: Secondary | ICD-10-CM | POA: Diagnosis not present

## 2019-02-11 DIAGNOSIS — I2510 Atherosclerotic heart disease of native coronary artery without angina pectoris: Secondary | ICD-10-CM | POA: Diagnosis not present

## 2019-02-11 DIAGNOSIS — C9002 Multiple myeloma in relapse: Secondary | ICD-10-CM | POA: Diagnosis not present

## 2019-02-11 DIAGNOSIS — M549 Dorsalgia, unspecified: Secondary | ICD-10-CM | POA: Diagnosis not present

## 2019-02-11 DIAGNOSIS — Z9181 History of falling: Secondary | ICD-10-CM | POA: Diagnosis not present

## 2019-02-11 DIAGNOSIS — I10 Essential (primary) hypertension: Secondary | ICD-10-CM | POA: Diagnosis not present

## 2019-02-11 DIAGNOSIS — G8929 Other chronic pain: Secondary | ICD-10-CM | POA: Diagnosis not present

## 2019-02-12 DIAGNOSIS — G8929 Other chronic pain: Secondary | ICD-10-CM | POA: Diagnosis not present

## 2019-02-12 DIAGNOSIS — M549 Dorsalgia, unspecified: Secondary | ICD-10-CM | POA: Diagnosis not present

## 2019-02-12 DIAGNOSIS — I10 Essential (primary) hypertension: Secondary | ICD-10-CM | POA: Diagnosis not present

## 2019-02-12 DIAGNOSIS — E119 Type 2 diabetes mellitus without complications: Secondary | ICD-10-CM | POA: Diagnosis not present

## 2019-02-12 DIAGNOSIS — C9002 Multiple myeloma in relapse: Secondary | ICD-10-CM | POA: Diagnosis not present

## 2019-02-12 DIAGNOSIS — Z7984 Long term (current) use of oral hypoglycemic drugs: Secondary | ICD-10-CM | POA: Diagnosis not present

## 2019-02-12 DIAGNOSIS — Z9181 History of falling: Secondary | ICD-10-CM | POA: Diagnosis not present

## 2019-02-12 DIAGNOSIS — I2510 Atherosclerotic heart disease of native coronary artery without angina pectoris: Secondary | ICD-10-CM | POA: Diagnosis not present

## 2019-02-13 DIAGNOSIS — Z7409 Other reduced mobility: Secondary | ICD-10-CM | POA: Diagnosis not present

## 2019-02-13 DIAGNOSIS — I2510 Atherosclerotic heart disease of native coronary artery without angina pectoris: Secondary | ICD-10-CM | POA: Diagnosis not present

## 2019-02-13 DIAGNOSIS — M549 Dorsalgia, unspecified: Secondary | ICD-10-CM | POA: Diagnosis not present

## 2019-02-13 DIAGNOSIS — C9002 Multiple myeloma in relapse: Secondary | ICD-10-CM | POA: Diagnosis not present

## 2019-02-13 DIAGNOSIS — I10 Essential (primary) hypertension: Secondary | ICD-10-CM | POA: Diagnosis not present

## 2019-02-13 DIAGNOSIS — Z7984 Long term (current) use of oral hypoglycemic drugs: Secondary | ICD-10-CM | POA: Diagnosis not present

## 2019-02-13 DIAGNOSIS — G8929 Other chronic pain: Secondary | ICD-10-CM | POA: Diagnosis not present

## 2019-02-13 DIAGNOSIS — E119 Type 2 diabetes mellitus without complications: Secondary | ICD-10-CM | POA: Diagnosis not present

## 2019-02-13 DIAGNOSIS — Z9181 History of falling: Secondary | ICD-10-CM | POA: Diagnosis not present

## 2019-02-13 DIAGNOSIS — C9000 Multiple myeloma not having achieved remission: Secondary | ICD-10-CM | POA: Diagnosis not present

## 2019-02-14 ENCOUNTER — Other Ambulatory Visit: Payer: Self-pay

## 2019-02-14 ENCOUNTER — Other Ambulatory Visit: Payer: Self-pay | Admitting: Oncology

## 2019-02-14 DIAGNOSIS — Z7984 Long term (current) use of oral hypoglycemic drugs: Secondary | ICD-10-CM | POA: Diagnosis not present

## 2019-02-14 DIAGNOSIS — M549 Dorsalgia, unspecified: Secondary | ICD-10-CM | POA: Diagnosis not present

## 2019-02-14 DIAGNOSIS — G8929 Other chronic pain: Secondary | ICD-10-CM | POA: Diagnosis not present

## 2019-02-14 DIAGNOSIS — C9002 Multiple myeloma in relapse: Secondary | ICD-10-CM | POA: Diagnosis not present

## 2019-02-14 DIAGNOSIS — E119 Type 2 diabetes mellitus without complications: Secondary | ICD-10-CM | POA: Diagnosis not present

## 2019-02-14 DIAGNOSIS — I10 Essential (primary) hypertension: Secondary | ICD-10-CM | POA: Diagnosis not present

## 2019-02-14 DIAGNOSIS — I2510 Atherosclerotic heart disease of native coronary artery without angina pectoris: Secondary | ICD-10-CM | POA: Diagnosis not present

## 2019-02-14 DIAGNOSIS — C9001 Multiple myeloma in remission: Secondary | ICD-10-CM

## 2019-02-14 DIAGNOSIS — Z9181 History of falling: Secondary | ICD-10-CM | POA: Diagnosis not present

## 2019-02-14 MED ORDER — LENALIDOMIDE 15 MG PO CAPS: 15.0000 mg | ORAL_CAPSULE | Freq: Every day | ORAL | 0 refills | Status: DC

## 2019-02-18 DIAGNOSIS — C9002 Multiple myeloma in relapse: Secondary | ICD-10-CM | POA: Diagnosis not present

## 2019-02-18 DIAGNOSIS — Z7984 Long term (current) use of oral hypoglycemic drugs: Secondary | ICD-10-CM | POA: Diagnosis not present

## 2019-02-18 DIAGNOSIS — Z9181 History of falling: Secondary | ICD-10-CM | POA: Diagnosis not present

## 2019-02-18 DIAGNOSIS — I2510 Atherosclerotic heart disease of native coronary artery without angina pectoris: Secondary | ICD-10-CM | POA: Diagnosis not present

## 2019-02-18 DIAGNOSIS — M549 Dorsalgia, unspecified: Secondary | ICD-10-CM | POA: Diagnosis not present

## 2019-02-18 DIAGNOSIS — G8929 Other chronic pain: Secondary | ICD-10-CM | POA: Diagnosis not present

## 2019-02-18 DIAGNOSIS — E119 Type 2 diabetes mellitus without complications: Secondary | ICD-10-CM | POA: Diagnosis not present

## 2019-02-18 DIAGNOSIS — I10 Essential (primary) hypertension: Secondary | ICD-10-CM | POA: Diagnosis not present

## 2019-02-19 ENCOUNTER — Inpatient Hospital Stay: Payer: Medicare HMO | Attending: Oncology

## 2019-02-19 ENCOUNTER — Other Ambulatory Visit: Payer: Self-pay

## 2019-02-19 ENCOUNTER — Other Ambulatory Visit: Payer: Medicare HMO

## 2019-02-19 DIAGNOSIS — M48062 Spinal stenosis, lumbar region with neurogenic claudication: Secondary | ICD-10-CM | POA: Diagnosis not present

## 2019-02-19 DIAGNOSIS — C9002 Multiple myeloma in relapse: Secondary | ICD-10-CM | POA: Insufficient documentation

## 2019-02-19 DIAGNOSIS — D649 Anemia, unspecified: Secondary | ICD-10-CM | POA: Insufficient documentation

## 2019-02-19 DIAGNOSIS — M25559 Pain in unspecified hip: Secondary | ICD-10-CM | POA: Diagnosis not present

## 2019-02-19 DIAGNOSIS — Z79899 Other long term (current) drug therapy: Secondary | ICD-10-CM | POA: Diagnosis not present

## 2019-02-19 DIAGNOSIS — E119 Type 2 diabetes mellitus without complications: Secondary | ICD-10-CM | POA: Diagnosis not present

## 2019-02-19 DIAGNOSIS — C9001 Multiple myeloma in remission: Secondary | ICD-10-CM

## 2019-02-19 DIAGNOSIS — M25511 Pain in right shoulder: Secondary | ICD-10-CM | POA: Diagnosis not present

## 2019-02-19 DIAGNOSIS — M961 Postlaminectomy syndrome, not elsewhere classified: Secondary | ICD-10-CM | POA: Diagnosis not present

## 2019-02-19 LAB — CBC WITH DIFFERENTIAL (CANCER CENTER ONLY)
Abs Immature Granulocytes: 0.02 10*3/uL (ref 0.00–0.07)
Basophils Absolute: 0.1 10*3/uL (ref 0.0–0.1)
Basophils Relative: 1 %
Eosinophils Absolute: 0.1 10*3/uL (ref 0.0–0.5)
Eosinophils Relative: 1 %
HCT: 38.4 % — ABNORMAL LOW (ref 39.0–52.0)
Hemoglobin: 12.7 g/dL — ABNORMAL LOW (ref 13.0–17.0)
Immature Granulocytes: 0 %
Lymphocytes Relative: 18 %
Lymphs Abs: 1.1 10*3/uL (ref 0.7–4.0)
MCH: 28.8 pg (ref 26.0–34.0)
MCHC: 33.1 g/dL (ref 30.0–36.0)
MCV: 87.1 fL (ref 80.0–100.0)
Monocytes Absolute: 0.1 10*3/uL (ref 0.1–1.0)
Monocytes Relative: 2 %
Neutro Abs: 4.8 10*3/uL (ref 1.7–7.7)
Neutrophils Relative %: 78 %
Platelet Count: 264 10*3/uL (ref 150–400)
RBC: 4.41 MIL/uL (ref 4.22–5.81)
RDW: 14.6 % (ref 11.5–15.5)
WBC Count: 6.2 10*3/uL (ref 4.0–10.5)
nRBC: 0 % (ref 0.0–0.2)

## 2019-02-19 LAB — CMP (CANCER CENTER ONLY)
ALT: 13 U/L (ref 0–44)
AST: 14 U/L — ABNORMAL LOW (ref 15–41)
Albumin: 3.4 g/dL — ABNORMAL LOW (ref 3.5–5.0)
Alkaline Phosphatase: 132 U/L — ABNORMAL HIGH (ref 38–126)
Anion gap: 14 (ref 5–15)
BUN: 9 mg/dL (ref 8–23)
CO2: 24 mmol/L (ref 22–32)
Calcium: 9.3 mg/dL (ref 8.9–10.3)
Chloride: 101 mmol/L (ref 98–111)
Creatinine: 1.14 mg/dL (ref 0.61–1.24)
GFR, Est AFR Am: >60 mL/min (ref >60)
GFR, Estimated: >60 mL/min (ref >60)
Glucose, Bld: 239 mg/dL — ABNORMAL HIGH (ref 70–99)
Potassium: 3.5 mmol/L (ref 3.5–5.1)
Sodium: 139 mmol/L (ref 135–145)
Total Bilirubin: 0.3 mg/dL (ref 0.3–1.2)
Total Protein: 8.6 g/dL — ABNORMAL HIGH (ref 6.5–8.1)

## 2019-02-20 LAB — KAPPA/LAMBDA LIGHT CHAINS
Kappa free light chain: 24 mg/L — ABNORMAL HIGH (ref 3.3–19.4)
Kappa, lambda light chain ratio: 2 — ABNORMAL HIGH (ref 0.26–1.65)
Lambda free light chains: 12 mg/L (ref 5.7–26.3)

## 2019-02-21 DIAGNOSIS — I10 Essential (primary) hypertension: Secondary | ICD-10-CM | POA: Diagnosis not present

## 2019-02-21 DIAGNOSIS — I2510 Atherosclerotic heart disease of native coronary artery without angina pectoris: Secondary | ICD-10-CM | POA: Diagnosis not present

## 2019-02-21 DIAGNOSIS — G8929 Other chronic pain: Secondary | ICD-10-CM | POA: Diagnosis not present

## 2019-02-21 DIAGNOSIS — Z9181 History of falling: Secondary | ICD-10-CM | POA: Diagnosis not present

## 2019-02-21 DIAGNOSIS — M549 Dorsalgia, unspecified: Secondary | ICD-10-CM | POA: Diagnosis not present

## 2019-02-21 DIAGNOSIS — E119 Type 2 diabetes mellitus without complications: Secondary | ICD-10-CM | POA: Diagnosis not present

## 2019-02-21 DIAGNOSIS — C9002 Multiple myeloma in relapse: Secondary | ICD-10-CM | POA: Diagnosis not present

## 2019-02-21 DIAGNOSIS — Z7984 Long term (current) use of oral hypoglycemic drugs: Secondary | ICD-10-CM | POA: Diagnosis not present

## 2019-02-24 DIAGNOSIS — E119 Type 2 diabetes mellitus without complications: Secondary | ICD-10-CM | POA: Diagnosis not present

## 2019-02-24 DIAGNOSIS — M549 Dorsalgia, unspecified: Secondary | ICD-10-CM | POA: Diagnosis not present

## 2019-02-24 DIAGNOSIS — G8929 Other chronic pain: Secondary | ICD-10-CM | POA: Diagnosis not present

## 2019-02-24 DIAGNOSIS — Z7984 Long term (current) use of oral hypoglycemic drugs: Secondary | ICD-10-CM | POA: Diagnosis not present

## 2019-02-24 DIAGNOSIS — I2510 Atherosclerotic heart disease of native coronary artery without angina pectoris: Secondary | ICD-10-CM | POA: Diagnosis not present

## 2019-02-24 DIAGNOSIS — C9002 Multiple myeloma in relapse: Secondary | ICD-10-CM | POA: Diagnosis not present

## 2019-02-24 DIAGNOSIS — Z9181 History of falling: Secondary | ICD-10-CM | POA: Diagnosis not present

## 2019-02-24 DIAGNOSIS — I10 Essential (primary) hypertension: Secondary | ICD-10-CM | POA: Diagnosis not present

## 2019-02-24 LAB — MULTIPLE MYELOMA PANEL, SERUM
Albumin SerPl Elph-Mcnc: 3.8 g/dL (ref 2.9–4.4)
Albumin/Glob SerPl: 1 (ref 0.7–1.7)
Alpha 1: 0.2 g/dL (ref 0.0–0.4)
Alpha2 Glob SerPl Elph-Mcnc: 1 g/dL (ref 0.4–1.0)
B-Globulin SerPl Elph-Mcnc: 2.4 g/dL — ABNORMAL HIGH (ref 0.7–1.3)
Gamma Glob SerPl Elph-Mcnc: 0.5 g/dL (ref 0.4–1.8)
Globulin, Total: 4.1 g/dL — ABNORMAL HIGH (ref 2.2–3.9)
IgA: 2224 mg/dL — ABNORMAL HIGH (ref 61–437)
IgG (Immunoglobin G), Serum: 644 mg/dL (ref 603–1613)
IgM (Immunoglobulin M), Srm: 10 mg/dL — ABNORMAL LOW (ref 15–143)
M Protein SerPl Elph-Mcnc: 1.3 g/dL — ABNORMAL HIGH
Total Protein ELP: 7.9 g/dL (ref 6.0–8.5)

## 2019-02-26 ENCOUNTER — Inpatient Hospital Stay (HOSPITAL_BASED_OUTPATIENT_CLINIC_OR_DEPARTMENT_OTHER): Payer: Medicare HMO | Admitting: Oncology

## 2019-02-26 ENCOUNTER — Other Ambulatory Visit: Payer: Self-pay

## 2019-02-26 VITALS — BP 153/71 | HR 100 | Temp 99.1°F | Resp 18 | Ht 72.0 in | Wt 188.3 lb

## 2019-02-26 DIAGNOSIS — Z9181 History of falling: Secondary | ICD-10-CM | POA: Diagnosis not present

## 2019-02-26 DIAGNOSIS — E119 Type 2 diabetes mellitus without complications: Secondary | ICD-10-CM

## 2019-02-26 DIAGNOSIS — Z79899 Other long term (current) drug therapy: Secondary | ICD-10-CM

## 2019-02-26 DIAGNOSIS — C9002 Multiple myeloma in relapse: Secondary | ICD-10-CM | POA: Diagnosis not present

## 2019-02-26 DIAGNOSIS — G8929 Other chronic pain: Secondary | ICD-10-CM | POA: Diagnosis not present

## 2019-02-26 DIAGNOSIS — M25559 Pain in unspecified hip: Secondary | ICD-10-CM | POA: Diagnosis not present

## 2019-02-26 DIAGNOSIS — M549 Dorsalgia, unspecified: Secondary | ICD-10-CM | POA: Diagnosis not present

## 2019-02-26 DIAGNOSIS — I2510 Atherosclerotic heart disease of native coronary artery without angina pectoris: Secondary | ICD-10-CM | POA: Diagnosis not present

## 2019-02-26 DIAGNOSIS — D649 Anemia, unspecified: Secondary | ICD-10-CM | POA: Diagnosis not present

## 2019-02-26 DIAGNOSIS — Z7984 Long term (current) use of oral hypoglycemic drugs: Secondary | ICD-10-CM | POA: Diagnosis not present

## 2019-02-26 DIAGNOSIS — C9001 Multiple myeloma in remission: Secondary | ICD-10-CM

## 2019-02-26 DIAGNOSIS — I10 Essential (primary) hypertension: Secondary | ICD-10-CM | POA: Diagnosis not present

## 2019-02-26 NOTE — Progress Notes (Signed)
Hematology and Oncology Follow Up Visit  James [name] [id number] [birth year] 73 y.o. 02/26/2019 3:48 PM [name], [name], MDEhinger, [name], MD   Principle Diagnosis: 73 year old man with IgA kappa multiple myeloma diagnosed in June 2017.   Prior therapy:  Revlimid and dexamethasone started on January 28 2016. Revlimid at 25 mg daily for 21 days with dexamethasone at 20 mg weekly. Therapy discontinued in January 2018 after achieving a complete response.  He developed relapsed disease in April 2020 with M spike of 3.9 g/dL IgM level of 4707.  Current therapy: Revlimid 15 mg daily started in May 2020.  This is given for 21 days out of a 28-day cycle with dexamethasone.  He is currently receiving his third cycle.   Interim History:  James [name] is here for a follow-up.  Since last visit, he restarted Revlimid at a lower dose given his relapsed disease and has restarted the Revlimid at a lower dose.  He tolerated the therapy without any major complications although his appetite has been affected and lost some weight.  He continues to have chronic pain issues related to his back and chest and currently receiving pain medication from pain clinic.  He denies any nausea, vomiting or skin rash.  Denies any hospitalization or illnesses.    He denied headaches, blurry vision, syncope or seizures.  Denies any fevers, chills or sweats.  Denied chest pain, palpitation, orthopnea or leg edema.  Denied cough, wheezing or hemoptysis.  Denied nausea, vomiting or abdominal pain.  Denies any constipation or diarrhea.  Denies any frequency urgency or hesitancy.  Denies any arthralgias or myalgias.  Denies any skin rashes or lesions.  Denies any bleeding or clotting tendency.  Denies any easy bruising.  Denies any hair or nail changes.  Denies any anxiety or depression.  Remaining review of system is negative.    .    Medications: Reviewed and updated.  Current Outpatient Medications  Medication Sig  Dispense Refill  . amLODipine (NORVASC) 10 MG tablet Take 10 mg by mouth every morning.     Marland Kitchen dexamethasone (DECADRON) 4 MG tablet Take 5 tablets weekly. 100 tablet 3  . ferrous sulfate 325 (65 FE) MG tablet Take 325 mg by mouth 2 (two) times daily with a meal.     . finasteride (PROSCAR) 5 MG tablet Take 5 mg by mouth daily.    Marland Kitchen gabapentin (NEURONTIN) 400 MG capsule Take 400 mg by mouth 3 (three) times daily.     Marland Kitchen glimepiride (AMARYL) 2 MG tablet Take 2 mg by mouth daily.    Marland Kitchen HYDROcodone-acetaminophen (NORCO) 10-325 MG tablet Take 1 tablet by mouth 3 (three) times daily.     Marland Kitchen HYDROcodone-acetaminophen (NORCO) 5-325 MG tablet Take 1 tablet by mouth every 6 (six) hours as needed. 1-2 tabs po q6 hours prn pain 15 tablet 0  . losartan (COZAAR) 50 MG tablet Take 50 mg by mouth every morning.     . metFORMIN (GLUCOPHAGE-XR) 500 MG 24 hr tablet Take 1,000 mg by mouth 2 (two) times daily.     Marland Kitchen omeprazole (PRILOSEC) 20 MG capsule Take 20 mg by mouth 2 (two) times daily before a meal.     . REVLIMID 15 MG capsule Take 1 capsule by mouth daily for 21 days then 7 days off, repeat every 28 days. 21 capsule 0  . Tamsulosin HCl (FLOMAX) 0.4 MG CAPS Take 0.4 mg by mouth 2 (two) times daily.     . traMADol (ULTRAM) 50  MG tablet Take 50 mg by mouth 4 (four) times daily -  with meals and at bedtime. For pain     No current facility-administered medications for this visit.     Allergies:  Allergies  Allergen Reactions  . Invokana [Canagliflozin] Anaphylaxis    Facial and lip swelling    Past Medical History, Surgical history, Social history, and Family History without any changes on review.   Physical Exam:  Blood pressure (!) 153/71, pulse 100, temperature 99.1 F (37.3 C), temperature source Oral, resp. rate 18, height 6' (1.829 m), weight 188 lb 4.8 oz (85.4 kg), SpO2 98 %.     ECOG: 1     General appearance: Alert, awake without any distress. Head: Atraumatic without  abnormalities Oropharynx: Without any thrush or ulcers. Eyes: No scleral icterus. Lymph nodes: No lymphadenopathy noted in the cervical, supraclavicular, or axillary nodes Heart:regular rate and rhythm, without any murmurs or gallops.   Lung: Clear to auscultation without any rhonchi, wheezes or dullness to percussion. Abdomin: Soft, nontender without any shifting dullness or ascites. Musculoskeletal: No clubbing or cyanosis. Neurological: No motor or sensory deficits. Skin: No rashes or lesions. Psychiatric: Mood and affect appeared normal.    Lab Results: Lab Results  Component Value Date   WBC 6.2 02/19/2019   HGB 12.7 (L) 02/19/2019   HCT 38.4 (L) 02/19/2019   MCV 87.1 02/19/2019   PLT 264 02/19/2019     Chemistry      Component Value Date/Time   NA 139 02/19/2019 0949   NA 138 06/26/2017 0946   K 3.5 02/19/2019 0949   K 3.8 06/26/2017 0946   CL 101 02/19/2019 0949   CL 105 08/09/2012 0918   CO2 24 02/19/2019 0949   CO2 25 06/26/2017 0946   BUN 9 02/19/2019 0949   BUN 16.4 06/26/2017 0946   CREATININE 1.14 02/19/2019 0949   CREATININE 1.2 06/26/2017 0946      Component Value Date/Time   CALCIUM 9.3 02/19/2019 0949   CALCIUM 9.3 06/26/2017 0946   ALKPHOS 132 (H) 02/19/2019 0949   ALKPHOS 75 06/26/2017 0946   AST 14 (L) 02/19/2019 0949   AST 11 06/26/2017 0946   ALT 13 02/19/2019 0949   ALT 14 06/26/2017 0946   BILITOT 0.3 02/19/2019 0949   BILITOT 0.31 06/26/2017 0946      Results for James [name] (MRN [id number]) as of 02/26/2019 15:48  Ref. Range 11/12/2018 11:57 02/19/2019 09:50  M Protein SerPl Elph-Mcnc Latest Ref Range: Not Observed g/dL 3.9 (H) 1.3 (H)   Results for James [name] (MRN [id number]) as of 02/26/2019 15:48  Ref. Range 04/12/2018 10:59 11/12/2018 11:57 02/19/2019 09:50  IgA Latest Ref Range: 61 - 437 mg/dL 2,541 (H) 4,707 (H) 2,224 (H)    Impression and Plan:  73 year old with:  1.  Multiple myeloma diagnosed in 2017.   He was found to have IgA kappa subtype with bone marrow involvement around 20%.   He relapsed in April 2020 and has been on Revlimid since that time.  Protein studies obtained on February 19, 2019 were personally reviewed and showed positive response to therapy.  His M spike has decreased by more than 50% currently at 1.3 from 3.9.  IgA level also decreased currently at 2224 which is a drop from 4707.  Risks and benefits of continuing therapy for the time being was reviewed he is agreeable to continue for the time being.     2.  Chronic back  and hip pain: Unchanged at this time and unrelated to his multiple myeloma.  3.  Anemia: Related to plasma cell disorder and continues to improve with treatment.  His hemoglobin back to most normal range.  4.  Diabetes: His blood sugar slightly elevated and urged him to continue monitoring at this time given the dexamethasone dosing.   5.  Follow-up: In 6 weeks for repeat evaluation.  25  minutes was spent with the patient face-to-face today.  More than 50% of time was dedicated to reviewing his disease status, reviewing laboratory data, complications related therapy and answering questions regarding future plan of care.   Zola Button, MD 7/29/20203:48 PM

## 2019-02-27 ENCOUNTER — Telehealth: Payer: Self-pay | Admitting: Oncology

## 2019-02-27 ENCOUNTER — Other Ambulatory Visit: Payer: Self-pay

## 2019-02-27 ENCOUNTER — Telehealth: Payer: Self-pay

## 2019-02-27 NOTE — Telephone Encounter (Signed)
Called and spoke with patient. Confirmed times and dates. Mailed printout per patients request

## 2019-02-27 NOTE — Telephone Encounter (Signed)
Received a call from the patient stating that his medications were not correct on his MD visit and wanted to make sure that we had the current med list. Updated patient medication list to reflect the current patient medications as reported by the patient. Patient stated that all his pain medications are managed and prescribed by his pain management clinic.

## 2019-03-05 DIAGNOSIS — Z7984 Long term (current) use of oral hypoglycemic drugs: Secondary | ICD-10-CM | POA: Diagnosis not present

## 2019-03-05 DIAGNOSIS — C9002 Multiple myeloma in relapse: Secondary | ICD-10-CM | POA: Diagnosis not present

## 2019-03-05 DIAGNOSIS — I2510 Atherosclerotic heart disease of native coronary artery without angina pectoris: Secondary | ICD-10-CM | POA: Diagnosis not present

## 2019-03-05 DIAGNOSIS — E119 Type 2 diabetes mellitus without complications: Secondary | ICD-10-CM | POA: Diagnosis not present

## 2019-03-05 DIAGNOSIS — Z9181 History of falling: Secondary | ICD-10-CM | POA: Diagnosis not present

## 2019-03-05 DIAGNOSIS — I10 Essential (primary) hypertension: Secondary | ICD-10-CM | POA: Diagnosis not present

## 2019-03-05 DIAGNOSIS — M549 Dorsalgia, unspecified: Secondary | ICD-10-CM | POA: Diagnosis not present

## 2019-03-05 DIAGNOSIS — G8929 Other chronic pain: Secondary | ICD-10-CM | POA: Diagnosis not present

## 2019-03-06 ENCOUNTER — Telehealth: Payer: Self-pay

## 2019-03-06 NOTE — Telephone Encounter (Signed)
Received call from [place] with [facility] requesting additional SN visits for 1x/week for 3 weeks for continued education. Verbal order provided for requested SN visits.

## 2019-03-12 ENCOUNTER — Other Ambulatory Visit: Payer: Self-pay

## 2019-03-12 DIAGNOSIS — C9001 Multiple myeloma in remission: Secondary | ICD-10-CM

## 2019-03-12 MED ORDER — LENALIDOMIDE 15 MG PO CAPS: ORAL_CAPSULE | ORAL | 0 refills | Status: DC

## 2019-03-13 DIAGNOSIS — C9002 Multiple myeloma in relapse: Secondary | ICD-10-CM | POA: Diagnosis not present

## 2019-03-13 DIAGNOSIS — M549 Dorsalgia, unspecified: Secondary | ICD-10-CM | POA: Diagnosis not present

## 2019-03-13 DIAGNOSIS — Z7984 Long term (current) use of oral hypoglycemic drugs: Secondary | ICD-10-CM | POA: Diagnosis not present

## 2019-03-13 DIAGNOSIS — Z9181 History of falling: Secondary | ICD-10-CM | POA: Diagnosis not present

## 2019-03-13 DIAGNOSIS — I2510 Atherosclerotic heart disease of native coronary artery without angina pectoris: Secondary | ICD-10-CM | POA: Diagnosis not present

## 2019-03-13 DIAGNOSIS — E119 Type 2 diabetes mellitus without complications: Secondary | ICD-10-CM | POA: Diagnosis not present

## 2019-03-13 DIAGNOSIS — I10 Essential (primary) hypertension: Secondary | ICD-10-CM | POA: Diagnosis not present

## 2019-03-13 DIAGNOSIS — G8929 Other chronic pain: Secondary | ICD-10-CM | POA: Diagnosis not present

## 2019-03-16 DIAGNOSIS — Z7409 Other reduced mobility: Secondary | ICD-10-CM | POA: Diagnosis not present

## 2019-03-16 DIAGNOSIS — G8929 Other chronic pain: Secondary | ICD-10-CM | POA: Diagnosis not present

## 2019-03-16 DIAGNOSIS — C9000 Multiple myeloma not having achieved remission: Secondary | ICD-10-CM | POA: Diagnosis not present

## 2019-03-26 ENCOUNTER — Other Ambulatory Visit: Payer: Self-pay

## 2019-03-26 ENCOUNTER — Inpatient Hospital Stay: Payer: Medicare HMO | Attending: Oncology

## 2019-03-26 DIAGNOSIS — C9002 Multiple myeloma in relapse: Secondary | ICD-10-CM | POA: Insufficient documentation

## 2019-03-26 DIAGNOSIS — C9001 Multiple myeloma in remission: Secondary | ICD-10-CM

## 2019-03-26 LAB — CBC WITH DIFFERENTIAL (CANCER CENTER ONLY)
Abs Immature Granulocytes: 0.03 10*3/uL (ref 0.00–0.07)
Basophils Absolute: 0 10*3/uL (ref 0.0–0.1)
Basophils Relative: 0 %
Eosinophils Absolute: 0 10*3/uL (ref 0.0–0.5)
Eosinophils Relative: 0 %
HCT: 37.6 % — ABNORMAL LOW (ref 39.0–52.0)
Hemoglobin: 12.6 g/dL — ABNORMAL LOW (ref 13.0–17.0)
Immature Granulocytes: 0 %
Lymphocytes Relative: 15 %
Lymphs Abs: 1.1 10*3/uL (ref 0.7–4.0)
MCH: 29.4 pg (ref 26.0–34.0)
MCHC: 33.5 g/dL (ref 30.0–36.0)
MCV: 87.9 fL (ref 80.0–100.0)
Monocytes Absolute: 0.1 10*3/uL (ref 0.1–1.0)
Monocytes Relative: 1 %
Neutro Abs: 6 10*3/uL (ref 1.7–7.7)
Neutrophils Relative %: 84 %
Platelet Count: 278 10*3/uL (ref 150–400)
RBC: 4.28 MIL/uL (ref 4.22–5.81)
RDW: 15.3 % (ref 11.5–15.5)
WBC Count: 7.2 10*3/uL (ref 4.0–10.5)
nRBC: 0 % (ref 0.0–0.2)

## 2019-03-26 LAB — CMP (CANCER CENTER ONLY)
ALT: 17 U/L (ref 0–44)
AST: 14 U/L — ABNORMAL LOW (ref 15–41)
Albumin: 3.7 g/dL (ref 3.5–5.0)
Alkaline Phosphatase: 137 U/L — ABNORMAL HIGH (ref 38–126)
Anion gap: 14 (ref 5–15)
BUN: 13 mg/dL (ref 8–23)
CO2: 24 mmol/L (ref 22–32)
Calcium: 9.3 mg/dL (ref 8.9–10.3)
Chloride: 98 mmol/L (ref 98–111)
Creatinine: 1.29 mg/dL — ABNORMAL HIGH (ref 0.61–1.24)
GFR, Est AFR Am: >60 mL/min (ref >60)
GFR, Estimated: 54 mL/min — ABNORMAL LOW (ref >60)
Glucose, Bld: 302 mg/dL — ABNORMAL HIGH (ref 70–99)
Potassium: 3.9 mmol/L (ref 3.5–5.1)
Sodium: 136 mmol/L (ref 135–145)
Total Bilirubin: 0.4 mg/dL (ref 0.3–1.2)
Total Protein: 7.9 g/dL (ref 6.5–8.1)

## 2019-03-27 LAB — KAPPA/LAMBDA LIGHT CHAINS
Kappa free light chain: 26.1 mg/L — ABNORMAL HIGH (ref 3.3–19.4)
Kappa, lambda light chain ratio: 1.72 — ABNORMAL HIGH (ref 0.26–1.65)
Lambda free light chains: 15.2 mg/L (ref 5.7–26.3)

## 2019-03-27 LAB — MULTIPLE MYELOMA PANEL, SERUM
Albumin SerPl Elph-Mcnc: 3.7 g/dL (ref 2.9–4.4)
Albumin/Glob SerPl: 1.1 (ref 0.7–1.7)
Alpha 1: 0.2 g/dL (ref 0.0–0.4)
Alpha2 Glob SerPl Elph-Mcnc: 0.9 g/dL (ref 0.4–1.0)
B-Globulin SerPl Elph-Mcnc: 1.8 g/dL — ABNORMAL HIGH (ref 0.7–1.3)
Gamma Glob SerPl Elph-Mcnc: 0.5 g/dL (ref 0.4–1.8)
Globulin, Total: 3.4 g/dL (ref 2.2–3.9)
IgA: 1422 mg/dL — ABNORMAL HIGH (ref 61–437)
IgG (Immunoglobin G), Serum: 625 mg/dL (ref 603–1613)
IgM (Immunoglobulin M), Srm: 12 mg/dL — ABNORMAL LOW (ref 15–143)
M Protein SerPl Elph-Mcnc: 0.7 g/dL — ABNORMAL HIGH
Total Protein ELP: 7.1 g/dL (ref 6.0–8.5)

## 2019-04-01 ENCOUNTER — Inpatient Hospital Stay: Payer: Medicare HMO | Attending: Oncology | Admitting: Oncology

## 2019-04-01 ENCOUNTER — Other Ambulatory Visit: Payer: Self-pay

## 2019-04-01 VITALS — BP 148/69 | HR 100 | Temp 98.7°F | Resp 18 | Ht 72.0 in | Wt 185.4 lb

## 2019-04-01 DIAGNOSIS — C9002 Multiple myeloma in relapse: Secondary | ICD-10-CM | POA: Diagnosis not present

## 2019-04-01 DIAGNOSIS — D649 Anemia, unspecified: Secondary | ICD-10-CM | POA: Insufficient documentation

## 2019-04-01 DIAGNOSIS — Z79899 Other long term (current) drug therapy: Secondary | ICD-10-CM | POA: Insufficient documentation

## 2019-04-01 DIAGNOSIS — M1990 Unspecified osteoarthritis, unspecified site: Secondary | ICD-10-CM | POA: Diagnosis not present

## 2019-04-01 DIAGNOSIS — C9001 Multiple myeloma in remission: Secondary | ICD-10-CM

## 2019-04-01 NOTE — Progress Notes (Signed)
Hematology and Oncology Follow Up Visit  Chipper Koudelka [id number] [birth year] 73 y.o. 04/01/2019 10:30 AM [name], MDEhinger, [name], MD   Principle Diagnosis: 73 year old man with multiple myeloma diagnosed in June 2017.  He was found to have IgA kappa subtype with worsening anemia.   Prior therapy:  Revlimid and dexamethasone started on January 28 2016. Revlimid at 25 mg daily for 21 days with dexamethasone at 20 mg weekly. Therapy discontinued in January 2018 after achieving a complete response.  He developed relapsed disease in April 2020 with M spike of 3.9 g/dL IgM level of 4707.  Current therapy: Revlimid 15 mg daily started in May 2020.  He is taking it daily for 21 days out of a 28-day cycle with dexamethasone.     Interim History:  Mr. Rayos is here for return evaluation.  Since the last visit, he reports no major changes in his health.  He has tolerated Revlimid without any major complications.  Denies any nausea, vomiting or abdominal pain.  He reports that he is eating better although his weight slightly down.  Does report some loose bowel habits that are manageable at this time.  Continues to have chronic back pain and rib pain which is not dramatically different.  He is ambulating short distances without any recent falls.  Patient denied any alteration mental status, neuropathy, confusion or dizziness.  Denies any headaches or lethargy.  Denies any night sweats, weight loss or changes in appetite.  Denied orthopnea, dyspnea on exertion or chest discomfort.  Denies shortness of breath, difficulty breathing hemoptysis or cough.  Denies any abdominal distention, nausea, early satiety or dyspepsia.  Denies any hematuria, frequency, dysuria or nocturia.  Denies any skin irritation, dryness or rash.  Denies any ecchymosis or petechiae.  Denies any lymphadenopathy or clotting.  Denies any heat or cold intolerance.  Denies any anxiety or depression.  Remaining review of system is  negative.         .    Medications: Without any change on review.  Current Outpatient Medications  Medication Sig Dispense Refill  . amLODipine (NORVASC) 10 MG tablet Take 10 mg by mouth every morning.     Marland Kitchen dexamethasone (DECADRON) 4 MG tablet Take 5 tablets weekly. 100 tablet 3  . ferrous sulfate 325 (65 FE) MG tablet Take 325 mg by mouth 2 (two) times daily with a meal.     . finasteride (PROSCAR) 5 MG tablet Take 5 mg by mouth daily.    Marland Kitchen gabapentin (NEURONTIN) 400 MG capsule Take 400 mg by mouth 3 (three) times daily.     Marland Kitchen glimepiride (AMARYL) 2 MG tablet Take 2 mg by mouth daily.    Marland Kitchen HYDROcodone-acetaminophen (NORCO) 10-325 MG tablet Take 1 tablet by mouth 3 (three) times daily.     Marland Kitchen HYSINGLA ER 60 MG T24A Take 1 tablet by mouth daily.    Marland Kitchen lenalidomide (REVLIMID) 15 MG capsule Take 1 capsule by mouth daily for 21 days then 7 days off, repeat every 28 days. Auth# [id number] on 03/13/19 21 capsule 0  . losartan (COZAAR) 50 MG tablet Take 50 mg by mouth every morning.     . metFORMIN (GLUCOPHAGE-XR) 500 MG 24 hr tablet Take 1,000 mg by mouth 2 (two) times daily.     Marland Kitchen omeprazole (PRILOSEC) 20 MG capsule Take 20 mg by mouth 2 (two) times daily before a meal.     . Tamsulosin HCl (FLOMAX) 0.4 MG CAPS Take 0.4 mg by  mouth 2 (two) times daily.     . traMADol (ULTRAM) 50 MG tablet Take 50 mg by mouth 4 (four) times daily -  with meals and at bedtime. For pain     No current facility-administered medications for this visit.     Allergies:  Allergies  Allergen Reactions  . Invokana [Canagliflozin] Anaphylaxis    Facial and lip swelling    Past Medical History, Surgical history, Social history, and Family History updated on review.   Physical Exam:  Blood pressure (!) 148/69, pulse 100, temperature 98.7 F (37.1 C), temperature source Oral, resp. rate 18, height 6' (1.829 m), weight 185 lb 6.4 oz (84.1 kg), SpO2 100 %.     ECOG: 1   General appearance: Comfortable  appearing without any discomfort Head: Normocephalic without any trauma Oropharynx: Mucous membranes are moist and pink without any thrush or ulcers. Eyes: Pupils are equal and round reactive to light. Lymph nodes: No cervical, supraclavicular, inguinal or axillary lymphadenopathy.   Heart:regular rate and rhythm.  S1 and S2 without leg edema. Lung: Clear without any rhonchi or wheezes.  No dullness to percussion. Abdomin: Soft, nontender, nondistended with good bowel sounds.  No hepatosplenomegaly. Musculoskeletal: No joint deformity or effusion.  Full range of motion noted. Neurological: No deficits noted on motor, sensory and deep tendon reflex exam. Skin: No petechial rash or dryness.  Appeared moist.      Lab Results: Lab Results  Component Value Date   WBC 7.2 03/26/2019   HGB 12.6 (L) 03/26/2019   HCT 37.6 (L) 03/26/2019   MCV 87.9 03/26/2019   PLT 278 03/26/2019     Chemistry      Component Value Date/Time   NA 136 03/26/2019 1146   NA 138 06/26/2017 0946   K 3.9 03/26/2019 1146   K 3.8 06/26/2017 0946   CL 98 03/26/2019 1146   CL 105 08/09/2012 0918   CO2 24 03/26/2019 1146   CO2 25 06/26/2017 0946   BUN 13 03/26/2019 1146   BUN 16.4 06/26/2017 0946   CREATININE 1.29 (H) 03/26/2019 1146   CREATININE 1.2 06/26/2017 0946      Component Value Date/Time   CALCIUM 9.3 03/26/2019 1146   CALCIUM 9.3 06/26/2017 0946   ALKPHOS 137 (H) 03/26/2019 1146   ALKPHOS 75 06/26/2017 0946   AST 14 (L) 03/26/2019 1146   AST 11 06/26/2017 0946   ALT 17 03/26/2019 1146   ALT 14 06/26/2017 0946   BILITOT 0.4 03/26/2019 1146   BILITOT 0.31 06/26/2017 0946               Results for Gos[place], ALAA EYERMAN (MRN [id number]) as of 04/01/2019 09:53  Ref. Range 04/12/2018 10:59 11/12/2018 11:57 02/19/2019 09:50 03/26/2019 11:48  M Protein SerPl Elph-Mcnc Latest Ref Range: Not Observed g/dL 2.1 (H) 3.9 (H) 1.3 (H) 0.7 (H)  IFE 1 Unknown Comment Comment Comment Comment  Globulin, Total  Latest Ref Range: 2.2 - 3.9 g/dL 4.4 (H) 6.4 (H) 4.1 (H) 3.4  B-Globulin SerPl Elph-Mcnc Latest Ref Range: 0.7 - 1.3 g/dL 2.9 (H) 4.8 (H) 2.4 (H) 1.8 (H)  IgG (Immunoglobin G), Serum Latest Ref Range: 603 - 1,613 mg/dL 627 (L) 571 (L) 644 625  IgM (Immunoglobulin M), Srm Latest Ref Range: 15 - 143 mg/dL 11 (L) 6 (L) 10 (L) 12 (L)  IgA Latest Ref Range: 61 - 437 mg/dL 2,541 (H) 4,707 (H) 2,224 (H) 1,422 (H)    Impression and Plan:  73 year old with:  1.  IgA kappa multiple myeloma diagnosed in 2017.  He developed relapsed disease in 2014 with worsening anemia  He continues to tolerate Revlimid without any major complications.  Protein studies obtained on 03/26/2019 were personally reviewed and continues to show positive response to therapy.  His M spike is down to 0.7 g/dL and IgA level continues to trend towards normal.  Risks and benefits of continuing this therapy to achieve a complete response was discussed.  Treatment options including discontinuation of therapy or using different salvage options were also reiterated.  At this time he is agreeable to continue.  The plan is to get him into remission before potential maintenance dosing of Revlimid.     2.  Chronic back and hip pain: Related to chronic arthritis rather than multiple myeloma.  3.  Anemia: Improved after treatment of his multiple myeloma.  His anemia related to plasma cell disorder.  4.  Diabetes: No recent exacerbation related to his treatment with dexamethasone.   5.  Follow-up: In 6 weeks for repeat evaluation.  25  minutes was spent with the patient face-to-face today.  More than 50% of time was spent on discussing his laboratory data, disease status update, treatment options and future plan of care.   Zola Button, MD 9/1/202010:30 AM

## 2019-04-02 ENCOUNTER — Telehealth: Payer: Self-pay | Admitting: Oncology

## 2019-04-02 NOTE — Progress Notes (Deleted)
Cardiology Office Note:    Date:  04/02/2019   ID:  [name], DOB November 13, 1945, MRN [id number]  PCP:  Cathlyn Parsons, PA-C  Cardiologist:  Shirlee More, MD    Referring MD: Cathlyn Parsons, PA-C    ASSESSMENT:    No diagnosis found. PLAN:    In order of problems listed above:  1. ***   Next appointment: ***   Medication Adjustments/Labs and Tests Ordered: Current medicines are reviewed at length with the patient today.  Concerns regarding medicines are outlined above.  No orders of the defined types were placed in this encounter.  No orders of the defined types were placed in this encounter.   No chief complaint on file.   History of Present Illness:    Kevin Lang is a 73 y.o. 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 He was started on Entresto and developed angioedema and stopped.and then wass placed on vasodilator therapy with hydralazine and isosorbide.  He was last seen 01/02/2019. Compliance with diet, lifestyle and medications: ***   Ref Range & Units 63moago 663mogo 3m38moo  NT-Pro BNP 0 - 376 pg/mL 1,743High   2,751High  CM  2,216High      Past Medical History:  Diagnosis Date  . Aneurysm of thoracic aorta ([facility]/20/2018   Overview:  Cardiac cath 12/08/15: Conclusions Diagnostic Procedure Summary 1. Mild non-obstructive CAD 2. Normal LV systolic function 3. Aortogram performed to eval aortic root aneurysm, 4.0 cm by this study.  CT Jan 2017, ascending aaorta aneurysm, 4.8 cm IMOUPDATE  . Benign essential hypertension 02/16/2017  . Cardiomyopathy ([place]/23/2017  . Chronic anticoagulation 05/21/2016  . Chronic combined systolic and diastolic congestive heart failure ([facility]/20/2018  . CKD (chronic kidney disease) stage 3, GFR 30-59  ml/min (HCC) 12/31/2015  . COPD (chronic obstructive pulmonary disease) ([facility]/22/2017  . Mild CAD 05/21/2016   Overview:  Cardiac cath 12/08/15:Conclusions Diagnostic Procedure Summary 1. Mild non-obstructive CAD 2. Normal LV systolic function 3. Aortogram performed to eval aortic root aneurysm, 4.0 cm by this study. Diagnostic Procedure Recommendations  . Persistent atrial fibrillation 02/16/2017    Past Surgical History:  Procedure Laterality Date  . CORONARY ANGIOPLASTY      Current Medications: No outpatient medications have been marked as taking for the 04/03/19 encounter (Appointment) with MunRichardo PriestD.     Allergies:   EntDelene Lollacubitril-valsartan]   Social History   Socioeconomic History  . Marital status: Married    Spouse name: Not on file  . Number of children: Not on file  . Years of education: Not on file  . Highest education level: Not on file  Occupational History  . Not on file  Social Needs  . Financial resource strain: Not on file  . Food insecurity    Worry: Not on file    Inability: Not on file  . Transportation needs    Medical: Not on file    Non-medical: Not on file  Tobacco Use  . Smoking status: Former Smoker    Packs/day: 0.50    Years: 30.00    Pack years: 15.00    Types: Cigarettes    Quit date: 08/22/2000    Years since quitting: 18.6  . Smokeless tobacco: Never Used  Substance  and Sexual Activity  . Alcohol use: No  . Drug use: No  . Sexual activity: Not on file  Lifestyle  . Physical activity    Days per week: Not on file    Minutes per session: Not on file  . Stress: Not on file  Relationships  . Social Herbalist on phone: Not on file    Gets together: Not on file    Attends religious service: Not on file    Active member of club or organization: Not on file    Attends meetings of clubs or organizations: Not on file    Relationship status: Not on file  Other Topics Concern  . Not on file  Social History  Narrative  . Not on file     Family History: The patient's ***family history includes Aneurysm in his father; Heart disease in his brother; Hypertension in his mother; Stroke in his mother. ROS:   Please see the history of present illness.    All other systems reviewed and are negative.  EKGs/Labs/Other Studies Reviewed:    The following studies were reviewed today:  EKG:  EKG ordered today and personally reviewed.  The ekg ordered today demonstrates ***  Recent Labs: 01/09/2019: ALT 20; BUN 32; Creatinine, Ser 2.00; Hemoglobin 17.0; NT-Pro BNP 1,743; Platelets 211; Potassium 4.0; Sodium 144  Recent Lipid Panel    Component Value Date/Time   CHOL 139 01/09/2019 0826   TRIG 132 01/09/2019 0826   HDL 42 01/09/2019 0826   CHOLHDL 3.3 01/09/2019 0826   LDLCALC 71 01/09/2019 0826    Physical Exam:    VS:  There were no vitals taken for this visit.    Wt Readings from Last 3 Encounters:  10/03/18 233 lb 9.6 oz (106 kg)  06/11/18 236 lb (107 kg)  04/30/18 233 lb 3.2 oz (105.8 kg)     GEN: *** Well nourished, well developed in no acute distress HEENT: Normal NECK: No JVD; No carotid bruits LYMPHATICS: No lymphadenopathy CARDIAC: ***RRR, no murmurs, rubs, gallops RESPIRATORY:  Clear to auscultation without rales, wheezing or rhonchi  ABDOMEN: Soft, non-tender, non-distended MUSCULOSKELETAL:  No edema; No deformity  SKIN: Warm and dry NEUROLOGIC:  Alert and oriented x 3 PSYCHIATRIC:  Normal affect    Signed, Shirlee More, MD  04/02/2019 6:52 PM    [facility]

## 2019-04-02 NOTE — Telephone Encounter (Signed)
Called and spoke with patient. Mailed schedule per patients request

## 2019-04-03 ENCOUNTER — Ambulatory Visit: Payer: Medicare PPO | Admitting: Cardiology

## 2019-04-10 ENCOUNTER — Other Ambulatory Visit: Payer: Self-pay

## 2019-04-10 DIAGNOSIS — C9001 Multiple myeloma in remission: Secondary | ICD-10-CM

## 2019-04-10 MED ORDER — LENALIDOMIDE 15 MG PO CAPS: ORAL_CAPSULE | ORAL | 0 refills | Status: DC

## 2019-04-16 DIAGNOSIS — Z7409 Other reduced mobility: Secondary | ICD-10-CM | POA: Diagnosis not present

## 2019-04-16 DIAGNOSIS — G8929 Other chronic pain: Secondary | ICD-10-CM | POA: Diagnosis not present

## 2019-04-16 DIAGNOSIS — C9000 Multiple myeloma not having achieved remission: Secondary | ICD-10-CM | POA: Diagnosis not present

## 2019-04-30 DIAGNOSIS — M48062 Spinal stenosis, lumbar region with neurogenic claudication: Secondary | ICD-10-CM | POA: Diagnosis not present

## 2019-05-06 ENCOUNTER — Inpatient Hospital Stay: Payer: Medicare HMO | Attending: Oncology

## 2019-05-06 ENCOUNTER — Other Ambulatory Visit: Payer: Self-pay

## 2019-05-06 DIAGNOSIS — G8929 Other chronic pain: Secondary | ICD-10-CM | POA: Insufficient documentation

## 2019-05-06 DIAGNOSIS — C9001 Multiple myeloma in remission: Secondary | ICD-10-CM

## 2019-05-06 DIAGNOSIS — M25559 Pain in unspecified hip: Secondary | ICD-10-CM | POA: Insufficient documentation

## 2019-05-06 DIAGNOSIS — D649 Anemia, unspecified: Secondary | ICD-10-CM | POA: Insufficient documentation

## 2019-05-06 DIAGNOSIS — E119 Type 2 diabetes mellitus without complications: Secondary | ICD-10-CM | POA: Insufficient documentation

## 2019-05-06 DIAGNOSIS — Z79899 Other long term (current) drug therapy: Secondary | ICD-10-CM | POA: Insufficient documentation

## 2019-05-06 DIAGNOSIS — C9002 Multiple myeloma in relapse: Secondary | ICD-10-CM | POA: Diagnosis present

## 2019-05-06 DIAGNOSIS — Z7984 Long term (current) use of oral hypoglycemic drugs: Secondary | ICD-10-CM | POA: Insufficient documentation

## 2019-05-06 LAB — CMP (CANCER CENTER ONLY)
ALT: 14 U/L (ref 0–44)
AST: 10 U/L — ABNORMAL LOW (ref 15–41)
Albumin: 3.5 g/dL (ref 3.5–5.0)
Alkaline Phosphatase: 132 U/L — ABNORMAL HIGH (ref 38–126)
Anion gap: 12 (ref 5–15)
BUN: 13 mg/dL (ref 8–23)
CO2: 26 mmol/L (ref 22–32)
Calcium: 9.7 mg/dL (ref 8.9–10.3)
Chloride: 98 mmol/L (ref 98–111)
Creatinine: 1.23 mg/dL (ref 0.61–1.24)
GFR, Est AFR Am: >60 mL/min (ref >60)
GFR, Estimated: 57 mL/min — ABNORMAL LOW (ref >60)
Glucose, Bld: 383 mg/dL — ABNORMAL HIGH (ref 70–99)
Potassium: 3.8 mmol/L (ref 3.5–5.1)
Sodium: 136 mmol/L (ref 135–145)
Total Bilirubin: 0.4 mg/dL (ref 0.3–1.2)
Total Protein: 7.8 g/dL (ref 6.5–8.1)

## 2019-05-06 LAB — CBC WITH DIFFERENTIAL (CANCER CENTER ONLY)
Abs Immature Granulocytes: 0.04 10*3/uL (ref 0.00–0.07)
Basophils Absolute: 0 10*3/uL (ref 0.0–0.1)
Basophils Relative: 0 %
Eosinophils Absolute: 0 10*3/uL (ref 0.0–0.5)
Eosinophils Relative: 0 %
HCT: 40.9 % (ref 39.0–52.0)
Hemoglobin: 13.4 g/dL (ref 13.0–17.0)
Immature Granulocytes: 1 %
Lymphocytes Relative: 8 %
Lymphs Abs: 0.6 10*3/uL — ABNORMAL LOW (ref 0.7–4.0)
MCH: 28.8 pg (ref 26.0–34.0)
MCHC: 32.8 g/dL (ref 30.0–36.0)
MCV: 87.8 fL (ref 80.0–100.0)
Monocytes Absolute: 0.2 10*3/uL (ref 0.1–1.0)
Monocytes Relative: 3 %
Neutro Abs: 6.2 10*3/uL (ref 1.7–7.7)
Neutrophils Relative %: 88 %
Platelet Count: 247 10*3/uL (ref 150–400)
RBC: 4.66 MIL/uL (ref 4.22–5.81)
RDW: 15.5 % (ref 11.5–15.5)
WBC Count: 7 10*3/uL (ref 4.0–10.5)
nRBC: 0 % (ref 0.0–0.2)

## 2019-05-06 MED ORDER — LENALIDOMIDE 15 MG PO CAPS: ORAL_CAPSULE | ORAL | 0 refills | Status: DC

## 2019-05-07 LAB — KAPPA/LAMBDA LIGHT CHAINS
Kappa free light chain: 29.7 mg/L — ABNORMAL HIGH (ref 3.3–19.4)
Kappa, lambda light chain ratio: 1.75 — ABNORMAL HIGH (ref 0.26–1.65)
Lambda free light chains: 17 mg/L (ref 5.7–26.3)

## 2019-05-08 LAB — MULTIPLE MYELOMA PANEL, SERUM
Albumin SerPl Elph-Mcnc: 3.7 g/dL (ref 2.9–4.4)
Albumin/Glob SerPl: 1.1 (ref 0.7–1.7)
Alpha 1: 0.3 g/dL (ref 0.0–0.4)
Alpha2 Glob SerPl Elph-Mcnc: 1.1 g/dL — ABNORMAL HIGH (ref 0.4–1.0)
B-Globulin SerPl Elph-Mcnc: 1.6 g/dL — ABNORMAL HIGH (ref 0.7–1.3)
Gamma Glob SerPl Elph-Mcnc: 0.5 g/dL (ref 0.4–1.8)
Globulin, Total: 3.4 g/dL (ref 2.2–3.9)
IgA: 1023 mg/dL — ABNORMAL HIGH (ref 61–437)
IgG (Immunoglobin G), Serum: 649 mg/dL (ref 603–1613)
IgM (Immunoglobulin M), Srm: 15 mg/dL (ref 15–143)
M Protein SerPl Elph-Mcnc: 0.8 g/dL — ABNORMAL HIGH
Total Protein ELP: 7.1 g/dL (ref 6.0–8.5)

## 2019-05-13 ENCOUNTER — Other Ambulatory Visit: Payer: Self-pay

## 2019-05-13 ENCOUNTER — Inpatient Hospital Stay: Payer: Medicare HMO | Admitting: Oncology

## 2019-05-13 VITALS — BP 145/84 | HR 100 | Temp 98.7°F | Resp 18 | Ht 72.0 in | Wt 185.5 lb

## 2019-05-13 DIAGNOSIS — C9002 Multiple myeloma in relapse: Secondary | ICD-10-CM | POA: Diagnosis not present

## 2019-05-13 DIAGNOSIS — Z79899 Other long term (current) drug therapy: Secondary | ICD-10-CM | POA: Diagnosis not present

## 2019-05-13 DIAGNOSIS — E119 Type 2 diabetes mellitus without complications: Secondary | ICD-10-CM | POA: Diagnosis not present

## 2019-05-13 DIAGNOSIS — C9001 Multiple myeloma in remission: Secondary | ICD-10-CM

## 2019-05-13 DIAGNOSIS — D649 Anemia, unspecified: Secondary | ICD-10-CM | POA: Diagnosis not present

## 2019-05-13 DIAGNOSIS — G8929 Other chronic pain: Secondary | ICD-10-CM | POA: Diagnosis not present

## 2019-05-13 DIAGNOSIS — Z7984 Long term (current) use of oral hypoglycemic drugs: Secondary | ICD-10-CM | POA: Diagnosis not present

## 2019-05-13 DIAGNOSIS — M25559 Pain in unspecified hip: Secondary | ICD-10-CM | POA: Diagnosis not present

## 2019-05-13 NOTE — Progress Notes (Signed)
Hematology and Oncology Follow Up Visit  James [name] [id number] [birth year] 73 y.o. 05/13/2019 9:43 AM James [name], James [name], MDEhinger, James [place], MD   Principle Diagnosis: 73 year old man with IgA Kappa multiple myeloma diagnosed in June 2017.  Marland Kitchen   Prior therapy:  Revlimid and dexamethasone started on January 28 2016. Revlimid at 25 mg daily for 21 days with dexamethasone at 20 mg weekly. Therapy discontinued in January 2018 after achieving a complete response.  He developed relapsed disease in April 2020 with M spike of 3.9 g/dL IgM level of 4707.  Current therapy: Revlimid 15 mg daily for 21 days out of a 28 day cycle with dexamethasone 40 mg weekly started in May 2020.     Interim History:  James [name] is here for a follow-up visit.  Since the last visit, he reports no major changes in his health.  He continues to have issues with chronic pain related to chronic arthritis including his back hips and chest wall.  He denies any recent falls or syncope.  Denies any recent hospitalizations or illnesses.  He continues to tolerate Revlimid and dexamethasone without any issues.  He is ambulating short distances and eating reasonably fair.  He did lose few pounds but overall weight is stable.  Quality of life is unchanged.   He denied headaches, blurry vision, syncope or seizures.  Denies any fevers, chills or sweats.  Denied chest pain, palpitation, orthopnea or leg edema.  Denied cough, wheezing or hemoptysis.  Denied nausea, vomiting or abdominal pain.  Denies any constipation or diarrhea.  Denies any frequency urgency or hesitancy.  Denies any arthralgias or myalgias.  Denies any skin rashes or lesions.  Denies any bleeding or clotting tendency.  Denies any easy bruising.  Denies any hair or nail changes.  Denies any anxiety or depression.  Remaining review of system is negative.            .    Medications: Without any change in review.  Current Outpatient Medications  Medication  Sig Dispense Refill  . amLODipine (NORVASC) 10 MG tablet Take 10 mg by mouth every morning.     Marland Kitchen dexamethasone (DECADRON) 4 MG tablet Take 5 tablets weekly. 100 tablet 3  . ferrous sulfate 325 (65 FE) MG tablet Take 325 mg by mouth 2 (two) times daily with a meal.     . finasteride (PROSCAR) 5 MG tablet Take 5 mg by mouth daily.    Marland Kitchen gabapentin (NEURONTIN) 400 MG capsule Take 400 mg by mouth 3 (three) times daily.     Marland Kitchen glimepiride (AMARYL) 2 MG tablet Take 2 mg by mouth daily.    Marland Kitchen HYDROcodone-acetaminophen (NORCO) 10-325 MG tablet Take 1 tablet by mouth 3 (three) times daily.     Marland Kitchen HYSINGLA ER 60 MG T24A Take 1 tablet by mouth daily.    Marland Kitchen lenalidomide (REVLIMID) 15 MG capsule Take 1 capsule by mouth daily for 21 days then 7 days off, repeat every 28 days. Auth# [id number] on 04/10/19 21 capsule 0  . losartan (COZAAR) 50 MG tablet Take 50 mg by mouth every morning.     . metFORMIN (GLUCOPHAGE-XR) 500 MG 24 hr tablet Take 1,000 mg by mouth 2 (two) times daily.     Marland Kitchen omeprazole (PRILOSEC) 20 MG capsule Take 20 mg by mouth 2 (two) times daily before a meal.     . Tamsulosin HCl (FLOMAX) 0.4 MG CAPS Take 0.4 mg by mouth 2 (two) times daily.     Marland Kitchen  traMADol (ULTRAM) 50 MG tablet Take 50 mg by mouth 4 (four) times daily -  with meals and at bedtime. For pain     No current facility-administered medications for this visit.     Allergies:  Allergies  Allergen Reactions  . Invokana [Canagliflozin] Anaphylaxis    Facial and lip swelling    Past Medical History, Surgical history, Social history, and Family History unchanged on review.   Physical Exam:   Blood pressure (!) 145/84, pulse 100, temperature 98.7 F (37.1 C), temperature source Oral, resp. rate 18, height 6' (1.829 m), weight 185 lb 8 oz (84.1 kg), SpO2 99 %.     ECOG: 1     General appearance: Alert, awake without any distress. Head: Atraumatic without abnormalities Oropharynx: Without any thrush or ulcers. Eyes: No  scleral icterus. Lymph nodes: No lymphadenopathy noted in the cervical, supraclavicular, or axillary nodes Heart:regular rate and rhythm, without any murmurs or gallops.   Lung: Clear to auscultation without any rhonchi, wheezes or dullness to percussion. Abdomin: Soft, nontender without any shifting dullness or ascites. Musculoskeletal: No clubbing or cyanosis. Neurological: No motor or sensory deficits. Skin: No rashes or lesions. Psychiatric: Mood and affect appeared normal.     Lab Results: Lab Results  Component Value Date   WBC 7.0 05/06/2019   HGB 13.4 05/06/2019   HCT 40.9 05/06/2019   MCV 87.8 05/06/2019   PLT 247 05/06/2019     Chemistry      Component Value Date/Time   NA 136 05/06/2019 1150   NA 138 06/26/2017 0946   K 3.8 05/06/2019 1150   K 3.8 06/26/2017 0946   CL 98 05/06/2019 1150   CL 105 08/09/2012 0918   CO2 26 05/06/2019 1150   CO2 25 06/26/2017 0946   BUN 13 05/06/2019 1150   BUN 16.4 06/26/2017 0946   CREATININE 1.23 05/06/2019 1150   CREATININE 1.2 06/26/2017 0946      Component Value Date/Time   CALCIUM 9.7 05/06/2019 1150   CALCIUM 9.3 06/26/2017 0946   ALKPHOS 132 (H) 05/06/2019 1150   ALKPHOS 75 06/26/2017 0946   AST 10 (L) 05/06/2019 1150   AST 11 06/26/2017 0946   ALT 14 05/06/2019 1150   ALT 14 06/26/2017 0946   BILITOT 0.4 05/06/2019 1150   BILITOT 0.31 06/26/2017 0946              Results for James [name] (MRN [id number]) as of 05/13/2019 09:01  Ref. Range 02/19/2019 09:50 03/26/2019 11:48 05/06/2019 11:50  IgA Latest Ref Range: 61 - 437 mg/dL 2,224 (H) 1,422 (H) 1,023 (H)   Results for James [name] (MRN [id number]) as of 05/13/2019 09:01  Ref. Range 02/19/2019 09:50 03/26/2019 11:48 05/06/2019 11:50  M Protein SerPl Elph-Mcnc Latest Ref Range: Not Observed g/dL 1.3 (H) 0.7 (H) 0.8 (H)     Impression and Plan:  73 year old with:  1.  Multiple myeloma diagnosed in 2017.  He was found to have IgA kappa  with worsening anemia and bone marrow involvement.  He has been receiving Revlimid at this time with the dexamethasone was continued with improvement in his protein studies.  IgA level continues to decline currently at 1023.  His M spike is down to 0.8 g/dL.  The natural course of this disease and treatment options were reviewed.  Alternative salvage regimens were also discussed at this time.  At this time, I have recommended continuing the same dose and schedule and will monitor his  protein studies and consider more maintenance Revlimid if he achieves a complete response.     2.  Chronic back and hip pain: Unrelated to multiple myeloma.  And appears to be related to chronic arthritic disease.  3.  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  His hemoglobin has normalized at this time.  4.  Diabetes: Blood sugar remains under reasonable control at this time.   5.  Follow-up: He will return in 6 weeks for repeat evaluation.  25  minutes was spent with the patient face-to-face today.  More than 50% of time was dedicated to updating his disease status, reviewing laboratory data, treatment options and answering questions regarding future plan of care.   Zola Button, MD 10/13/20209:43 AM

## 2019-05-15 DIAGNOSIS — R69 Illness, unspecified: Secondary | ICD-10-CM | POA: Diagnosis not present

## 2019-05-16 DIAGNOSIS — G8929 Other chronic pain: Secondary | ICD-10-CM | POA: Diagnosis not present

## 2019-05-16 DIAGNOSIS — C9000 Multiple myeloma not having achieved remission: Secondary | ICD-10-CM | POA: Diagnosis not present

## 2019-05-16 DIAGNOSIS — Z7409 Other reduced mobility: Secondary | ICD-10-CM | POA: Diagnosis not present

## 2019-05-20 DIAGNOSIS — C9000 Multiple myeloma not having achieved remission: Secondary | ICD-10-CM | POA: Diagnosis not present

## 2019-05-20 DIAGNOSIS — G894 Chronic pain syndrome: Secondary | ICD-10-CM | POA: Diagnosis not present

## 2019-05-20 DIAGNOSIS — C9001 Multiple myeloma in remission: Secondary | ICD-10-CM | POA: Diagnosis not present

## 2019-05-20 DIAGNOSIS — S2241XA Multiple fractures of ribs, right side, initial encounter for closed fracture: Secondary | ICD-10-CM | POA: Diagnosis not present

## 2019-05-23 ENCOUNTER — Other Ambulatory Visit: Payer: Self-pay

## 2019-05-23 DIAGNOSIS — C9001 Multiple myeloma in remission: Secondary | ICD-10-CM

## 2019-05-23 MED ORDER — LENALIDOMIDE 15 MG PO CAPS: ORAL_CAPSULE | ORAL | 0 refills | Status: DC

## 2019-05-28 DIAGNOSIS — C9001 Multiple myeloma in remission: Secondary | ICD-10-CM | POA: Diagnosis not present

## 2019-05-28 DIAGNOSIS — S2243XA Multiple fractures of ribs, bilateral, initial encounter for closed fracture: Secondary | ICD-10-CM | POA: Diagnosis not present

## 2019-06-13 DIAGNOSIS — S2243XG Multiple fractures of ribs, bilateral, subsequent encounter for fracture with delayed healing: Secondary | ICD-10-CM | POA: Diagnosis not present

## 2019-06-13 DIAGNOSIS — G894 Chronic pain syndrome: Secondary | ICD-10-CM | POA: Diagnosis not present

## 2019-06-13 DIAGNOSIS — C9002 Multiple myeloma in relapse: Secondary | ICD-10-CM | POA: Diagnosis not present

## 2019-06-16 DIAGNOSIS — C9000 Multiple myeloma not having achieved remission: Secondary | ICD-10-CM | POA: Diagnosis not present

## 2019-06-16 DIAGNOSIS — Z7409 Other reduced mobility: Secondary | ICD-10-CM | POA: Diagnosis not present

## 2019-06-16 DIAGNOSIS — G8929 Other chronic pain: Secondary | ICD-10-CM | POA: Diagnosis not present

## 2019-06-20 ENCOUNTER — Inpatient Hospital Stay: Payer: Medicare HMO | Attending: Oncology

## 2019-06-20 ENCOUNTER — Other Ambulatory Visit: Payer: Self-pay

## 2019-06-20 DIAGNOSIS — D649 Anemia, unspecified: Secondary | ICD-10-CM | POA: Diagnosis not present

## 2019-06-20 DIAGNOSIS — C9002 Multiple myeloma in relapse: Secondary | ICD-10-CM | POA: Insufficient documentation

## 2019-06-20 DIAGNOSIS — M4800 Spinal stenosis, site unspecified: Secondary | ICD-10-CM | POA: Diagnosis not present

## 2019-06-20 DIAGNOSIS — Z7984 Long term (current) use of oral hypoglycemic drugs: Secondary | ICD-10-CM | POA: Insufficient documentation

## 2019-06-20 DIAGNOSIS — E119 Type 2 diabetes mellitus without complications: Secondary | ICD-10-CM | POA: Insufficient documentation

## 2019-06-20 DIAGNOSIS — C9001 Multiple myeloma in remission: Secondary | ICD-10-CM

## 2019-06-20 DIAGNOSIS — M1990 Unspecified osteoarthritis, unspecified site: Secondary | ICD-10-CM | POA: Insufficient documentation

## 2019-06-20 DIAGNOSIS — Z79899 Other long term (current) drug therapy: Secondary | ICD-10-CM | POA: Insufficient documentation

## 2019-06-20 LAB — CBC WITH DIFFERENTIAL (CANCER CENTER ONLY)
Abs Immature Granulocytes: 0.04 10*3/uL (ref 0.00–0.07)
Basophils Absolute: 0 10*3/uL (ref 0.0–0.1)
Basophils Relative: 0 %
Eosinophils Absolute: 0.1 10*3/uL (ref 0.0–0.5)
Eosinophils Relative: 1 %
HCT: 36.8 % — ABNORMAL LOW (ref 39.0–52.0)
Hemoglobin: 12.4 g/dL — ABNORMAL LOW (ref 13.0–17.0)
Immature Granulocytes: 0 %
Lymphocytes Relative: 22 %
Lymphs Abs: 2.4 10*3/uL (ref 0.7–4.0)
MCH: 29.5 pg (ref 26.0–34.0)
MCHC: 33.7 g/dL (ref 30.0–36.0)
MCV: 87.4 fL (ref 80.0–100.0)
Monocytes Absolute: 1 10*3/uL (ref 0.1–1.0)
Monocytes Relative: 9 %
Neutro Abs: 7.5 10*3/uL (ref 1.7–7.7)
Neutrophils Relative %: 68 %
Platelet Count: 289 10*3/uL (ref 150–400)
RBC: 4.21 MIL/uL — ABNORMAL LOW (ref 4.22–5.81)
RDW: 16.7 % — ABNORMAL HIGH (ref 11.5–15.5)
WBC Count: 11 10*3/uL — ABNORMAL HIGH (ref 4.0–10.5)
nRBC: 0 % (ref 0.0–0.2)

## 2019-06-20 LAB — CMP (CANCER CENTER ONLY)
ALT: 16 U/L (ref 0–44)
AST: 11 U/L — ABNORMAL LOW (ref 15–41)
Albumin: 3.9 g/dL (ref 3.5–5.0)
Alkaline Phosphatase: 124 U/L (ref 38–126)
Anion gap: 14 (ref 5–15)
BUN: 17 mg/dL (ref 8–23)
CO2: 23 mmol/L (ref 22–32)
Calcium: 9.3 mg/dL (ref 8.9–10.3)
Chloride: 101 mmol/L (ref 98–111)
Creatinine: 1.04 mg/dL (ref 0.61–1.24)
GFR, Est AFR Am: >60 mL/min (ref >60)
GFR, Estimated: >60 mL/min (ref >60)
Glucose, Bld: 86 mg/dL (ref 70–99)
Potassium: 3.4 mmol/L — ABNORMAL LOW (ref 3.5–5.1)
Sodium: 138 mmol/L (ref 135–145)
Total Bilirubin: 0.4 mg/dL (ref 0.3–1.2)
Total Protein: 7.5 g/dL (ref 6.5–8.1)

## 2019-06-20 MED ORDER — LENALIDOMIDE 15 MG PO CAPS: ORAL_CAPSULE | ORAL | 0 refills | Status: DC

## 2019-06-23 ENCOUNTER — Telehealth: Payer: Self-pay | Admitting: Oncology

## 2019-06-23 ENCOUNTER — Telehealth: Payer: Self-pay

## 2019-06-23 LAB — KAPPA/LAMBDA LIGHT CHAINS
Kappa free light chain: 19.2 mg/L (ref 3.3–19.4)
Kappa, lambda light chain ratio: 1.57 (ref 0.26–1.65)
Lambda free light chains: 12.2 mg/L (ref 5.7–26.3)

## 2019-06-23 NOTE — Telephone Encounter (Signed)
Received call from patient stating that he prefers to have an in person visit because he has some concerns that he feels need to be addressed face to face. Explained that will follow up with Dr. Alen Blew to gave visit changed back to face to face. Per Dr. Alen Blew can change visit to in person. Contacted patient and made him aware.

## 2019-06-23 NOTE — Telephone Encounter (Signed)
Called patient per providers request to change appointment to convert visit to either phone or virtual. Patient would feel more comfortable with this being a phone visit.

## 2019-06-24 ENCOUNTER — Inpatient Hospital Stay (HOSPITAL_BASED_OUTPATIENT_CLINIC_OR_DEPARTMENT_OTHER): Payer: Medicare HMO | Admitting: Oncology

## 2019-06-24 ENCOUNTER — Other Ambulatory Visit: Payer: Self-pay

## 2019-06-24 VITALS — BP 187/84 | HR 102 | Temp 98.9°F | Resp 18 | Ht 72.0 in | Wt 190.4 lb

## 2019-06-24 DIAGNOSIS — E119 Type 2 diabetes mellitus without complications: Secondary | ICD-10-CM | POA: Diagnosis not present

## 2019-06-24 DIAGNOSIS — C9001 Multiple myeloma in remission: Secondary | ICD-10-CM | POA: Diagnosis not present

## 2019-06-24 DIAGNOSIS — Z79899 Other long term (current) drug therapy: Secondary | ICD-10-CM | POA: Diagnosis not present

## 2019-06-24 DIAGNOSIS — Z7984 Long term (current) use of oral hypoglycemic drugs: Secondary | ICD-10-CM | POA: Diagnosis not present

## 2019-06-24 DIAGNOSIS — C9002 Multiple myeloma in relapse: Secondary | ICD-10-CM | POA: Diagnosis not present

## 2019-06-24 DIAGNOSIS — M4800 Spinal stenosis, site unspecified: Secondary | ICD-10-CM | POA: Diagnosis not present

## 2019-06-24 DIAGNOSIS — M1990 Unspecified osteoarthritis, unspecified site: Secondary | ICD-10-CM | POA: Diagnosis not present

## 2019-06-24 DIAGNOSIS — D649 Anemia, unspecified: Secondary | ICD-10-CM | POA: Diagnosis not present

## 2019-06-24 NOTE — Progress Notes (Signed)
Hematology and Oncology Follow Up Visit  James [name] [id number] 04-10-1946 73 y.o. 06/24/2019 3:29 PM [name], MDEhinger, [name], MD   Principle Diagnosis: 73 year old man with multiple myeloma diagnosed in June 2017.  He was found to have IgA Kappa subtype with worsening anemia and 30% plasma cell in the bone marrow.   Prior therapy:  Revlimid and dexamethasone started on January 28 2016. Revlimid at 25 mg daily for 21 days with dexamethasone at 20 mg weekly. Therapy discontinued in January 2018 after achieving a complete response.  He developed relapsed disease in April 2020 with M spike of 3.9 g/dL IgM level of 4707.  Current therapy: Revlimid 15 mg daily for 21 days out of a 28 day cycle with dexamethasone 40 mg weekly started in May 2020.     Interim History:  [name] is here for return evaluation.  Since the last visit, he reports no new complaints.  He has reported overall improvement in his health and mobility.  His performance status is slightly better and ambulating very slowly and requires a cane.  He denies any complications related to Revlimid.  He denies any nausea, abdominal pain or vomiting.  He denies any worsening bone pain although he has chronic hip and back pain which is manageable at this time with current pain medication.   He denied any alteration mental status, neuropathy, confusion or dizziness.  Denies any headaches or lethargy.  Denies any night sweats, weight loss or changes in appetite.  Denied orthopnea, dyspnea on exertion or chest discomfort.  Denies shortness of breath, difficulty breathing hemoptysis or cough.  Denies any abdominal distention, nausea, early satiety or dyspepsia.  Denies any hematuria, frequency, dysuria or nocturia.  Denies any skin irritation, dryness or rash.  Denies any ecchymosis or petechiae.  Denies any lymphadenopathy or clotting.  Denies any heat or cold intolerance.  Denies any anxiety or depression.  Remaining review  of system is negative.                .    Medications: Updated without any changes.  Current Outpatient Medications  Medication Sig Dispense Refill  . amLODipine (NORVASC) 10 MG tablet Take 10 mg by mouth every morning.     Marland Kitchen dexamethasone (DECADRON) 4 MG tablet Take 5 tablets weekly. 100 tablet 3  . ferrous sulfate 325 (65 FE) MG tablet Take 325 mg by mouth 2 (two) times daily with a meal.     . finasteride (PROSCAR) 5 MG tablet Take 5 mg by mouth daily.    Marland Kitchen gabapentin (NEURONTIN) 400 MG capsule Take 400 mg by mouth 3 (three) times daily.     Marland Kitchen glimepiride (AMARYL) 2 MG tablet Take 2 mg by mouth daily.    Marland Kitchen HYDROcodone-acetaminophen (NORCO) 10-325 MG tablet Take 1 tablet by mouth 3 (three) times daily.     Marland Kitchen HYSINGLA ER 60 MG T24A Take 1 tablet by mouth daily.    Marland Kitchen lenalidomide (REVLIMID) 15 MG capsule Take 1 capsule by mouth daily for 21 days then 7 days off, repeat every 28 days. Auth# [id number]  on 06/20/19 21 capsule 0  . losartan (COZAAR) 50 MG tablet Take 50 mg by mouth every morning.     . metFORMIN (GLUCOPHAGE-XR) 500 MG 24 hr tablet Take 1,000 mg by mouth 2 (two) times daily.     Marland Kitchen omeprazole (PRILOSEC) 20 MG capsule Take 20 mg by mouth 2 (two) times daily before a meal.     .  Tamsulosin HCl (FLOMAX) 0.4 MG CAPS Take 0.4 mg by mouth 2 (two) times daily.     . traMADol (ULTRAM) 50 MG tablet Take 50 mg by mouth 4 (four) times daily -  with meals and at bedtime. For pain     No current facility-administered medications for this visit.     Allergies:  Allergies  Allergen Reactions  . Invokana [Canagliflozin] Anaphylaxis    Facial and lip swelling    Past Medical History, Surgical history, Social history, and Family History reviewed without any changes.   Physical Exam:     Blood pressure (!) 187/84, pulse (!) 102, temperature 98.9 F (37.2 C), temperature source Temporal, resp. rate 18, height 6' (1.829 m), weight 190 lb 6.4 oz (86.4 kg), SpO2 98  %.    ECOG: 1   General appearance: Comfortable appearing without any discomfort Head: Normocephalic without any trauma Oropharynx: Mucous membranes are moist and pink without any thrush or ulcers. Eyes: Pupils are equal and round reactive to light. Lymph nodes: No cervical, supraclavicular, inguinal or axillary lymphadenopathy.   Heart:regular rate and rhythm.  S1 and S2 without leg edema. Lung: Clear without any rhonchi or wheezes.  No dullness to percussion. Abdomin: Soft, nontender, nondistended with good bowel sounds.  No hepatosplenomegaly. Musculoskeletal: No joint deformity or effusion.  Full range of motion noted. Neurological: No deficits noted on motor, sensory and deep tendon reflex exam. Skin: No petechial rash or dryness.  Appeared moist.  .      Lab Results: Lab Results  Component Value Date   WBC 11.0 (H) 06/20/2019   HGB 12.4 (L) 06/20/2019   HCT 36.8 (L) 06/20/2019   MCV 87.4 06/20/2019   PLT 289 06/20/2019     Chemistry      Component Value Date/Time   NA 138 06/20/2019 1247   NA 138 06/26/2017 0946   K 3.4 (L) 06/20/2019 1247   K 3.8 06/26/2017 0946   CL 101 06/20/2019 1247   CL 105 08/09/2012 0918   CO2 23 06/20/2019 1247   CO2 25 06/26/2017 0946   BUN 17 06/20/2019 1247   BUN 16.4 06/26/2017 0946   CREATININE 1.04 06/20/2019 1247   CREATININE 1.2 06/26/2017 0946      Component Value Date/Time   CALCIUM 9.3 06/20/2019 1247   CALCIUM 9.3 06/26/2017 0946   ALKPHOS 124 06/20/2019 1247   ALKPHOS 75 06/26/2017 0946   AST 11 (L) 06/20/2019 1247   AST 11 06/26/2017 0946   ALT 16 06/20/2019 1247   ALT 14 06/26/2017 0946   BILITOT 0.4 06/20/2019 1247   BILITOT 0.31 06/26/2017 0946      Results for James [name] (MRN [id number]) as of 06/24/2019 15:30  Ref. Range 03/26/2019 11:46 03/26/2019 11:48 05/06/2019 11:50 06/20/2019 12:47  Kappa free light chain Latest Ref Range: 3.3 - 19.4 mg/L 26.1 (H)  29.7 (H) 19.2  Lamda free light chains  Latest Ref Range: 5.7 - 26.3 mg/L 15.2  17.0 12.2  Kappa, lamda light chain ratio Latest Ref Range: 0.26 - 1.65  1.72 (H)  1.75 (H) 1.57     Impression and Plan:  73 year old with:  1.  IgA kappa multiple myeloma presented with a worsening anemia as well as a 30% plasma involvement in the bone marrow in 2017.    He is currently on Revlimid with excellent response documented on his protein studies.  He has a normalization of his kappa and lambda levels and normal ratio based on  laboratory testing on 06/20/2019.  Remaining laboratory testing including CBC and chemistry all close to normal range.  Risks and benefits of continuing Revlimid versus discontinuation was discussed.  For the time being I have opted to continue with the same dose and schedule with consideration for possible switching to maintenance doses after he achieves a complete response of therapy.     2.  Chronic back and hip pain: He continues to have issues unrelated to his multiple myeloma unrelated to arthritis and spinal stenosis.  3.  Anemia: Hemoglobin continues to be close to normal range at this time.  His anemia is related to plasma cell disorder.  4.  Diabetes: No recent exacerbation noted.   5.  Follow-up: We will be in 6 weeks for repeat evaluation.  25  minutes was spent with the patient face-to-face today.  More than 50% of time was spent on laboratory data review, discussing treatment options as well as future plan of care.   Zola Button, MD 11/24/20203:29 PM

## 2019-06-25 ENCOUNTER — Telehealth: Payer: Self-pay | Admitting: Oncology

## 2019-06-25 LAB — MULTIPLE MYELOMA PANEL, SERUM
Albumin SerPl Elph-Mcnc: 3.9 g/dL (ref 2.9–4.4)
Albumin/Glob SerPl: 1.4 (ref 0.7–1.7)
Alpha 1: 0.2 g/dL (ref 0.0–0.4)
Alpha2 Glob SerPl Elph-Mcnc: 0.9 g/dL (ref 0.4–1.0)
B-Globulin SerPl Elph-Mcnc: 1.4 g/dL — ABNORMAL HIGH (ref 0.7–1.3)
Gamma Glob SerPl Elph-Mcnc: 0.5 g/dL (ref 0.4–1.8)
Globulin, Total: 2.9 g/dL (ref 2.2–3.9)
IgA: 668 mg/dL — ABNORMAL HIGH (ref 61–437)
IgG (Immunoglobin G), Serum: 571 mg/dL — ABNORMAL LOW (ref 603–1613)
IgM (Immunoglobulin M), Srm: 13 mg/dL — ABNORMAL LOW (ref 15–143)
M Protein SerPl Elph-Mcnc: 0.5 g/dL — ABNORMAL HIGH
Total Protein ELP: 6.8 g/dL (ref 6.0–8.5)

## 2019-06-25 NOTE — Telephone Encounter (Signed)
Scheduled appt per 11/24 los.  Spoke with pt and he is aware of his appt date and calendar.  Per the pt request a calendar will be mailed out

## 2019-07-16 DIAGNOSIS — Z7409 Other reduced mobility: Secondary | ICD-10-CM | POA: Diagnosis not present

## 2019-07-16 DIAGNOSIS — C9000 Multiple myeloma not having achieved remission: Secondary | ICD-10-CM | POA: Diagnosis not present

## 2019-07-16 DIAGNOSIS — G8929 Other chronic pain: Secondary | ICD-10-CM | POA: Diagnosis not present

## 2019-07-29 ENCOUNTER — Other Ambulatory Visit: Payer: Medicare HMO

## 2019-07-30 ENCOUNTER — Other Ambulatory Visit: Payer: Self-pay

## 2019-07-30 DIAGNOSIS — C9001 Multiple myeloma in remission: Secondary | ICD-10-CM

## 2019-07-30 MED ORDER — LENALIDOMIDE 15 MG PO CAPS: ORAL_CAPSULE | ORAL | 0 refills | Status: DC

## 2019-08-06 ENCOUNTER — Inpatient Hospital Stay: Payer: Medicare HMO

## 2019-08-06 ENCOUNTER — Other Ambulatory Visit: Payer: Self-pay

## 2019-08-06 ENCOUNTER — Inpatient Hospital Stay: Payer: Medicare HMO | Attending: Oncology | Admitting: Oncology

## 2019-08-06 VITALS — BP 194/92 | HR 117 | Temp 99.0°F | Resp 20 | Ht 73.0 in | Wt 189.6 lb

## 2019-08-06 DIAGNOSIS — C9001 Multiple myeloma in remission: Secondary | ICD-10-CM

## 2019-08-06 DIAGNOSIS — G8929 Other chronic pain: Secondary | ICD-10-CM | POA: Insufficient documentation

## 2019-08-06 DIAGNOSIS — Z79899 Other long term (current) drug therapy: Secondary | ICD-10-CM | POA: Insufficient documentation

## 2019-08-06 DIAGNOSIS — R197 Diarrhea, unspecified: Secondary | ICD-10-CM | POA: Insufficient documentation

## 2019-08-06 DIAGNOSIS — M4800 Spinal stenosis, site unspecified: Secondary | ICD-10-CM | POA: Insufficient documentation

## 2019-08-06 DIAGNOSIS — C9002 Multiple myeloma in relapse: Secondary | ICD-10-CM | POA: Insufficient documentation

## 2019-08-06 DIAGNOSIS — D649 Anemia, unspecified: Secondary | ICD-10-CM | POA: Diagnosis not present

## 2019-08-06 DIAGNOSIS — M1990 Unspecified osteoarthritis, unspecified site: Secondary | ICD-10-CM | POA: Diagnosis not present

## 2019-08-06 LAB — CMP (CANCER CENTER ONLY)
ALT: 19 U/L (ref 0–44)
AST: 12 U/L — ABNORMAL LOW (ref 15–41)
Albumin: 4.3 g/dL (ref 3.5–5.0)
Alkaline Phosphatase: 140 U/L — ABNORMAL HIGH (ref 38–126)
Anion gap: 17 — ABNORMAL HIGH (ref 5–15)
BUN: 11 mg/dL (ref 8–23)
CO2: 20 mmol/L — ABNORMAL LOW (ref 22–32)
Calcium: 9.6 mg/dL (ref 8.9–10.3)
Chloride: 102 mmol/L (ref 98–111)
Creatinine: 1.34 mg/dL — ABNORMAL HIGH (ref 0.61–1.24)
GFR, Est AFR Am: 60 mL/min — ABNORMAL LOW (ref >60)
GFR, Estimated: 51 mL/min — ABNORMAL LOW (ref >60)
Glucose, Bld: 477 mg/dL — ABNORMAL HIGH (ref 70–99)
Potassium: 4.2 mmol/L (ref 3.5–5.1)
Sodium: 139 mmol/L (ref 135–145)
Total Bilirubin: 0.3 mg/dL (ref 0.3–1.2)
Total Protein: 8.2 g/dL — ABNORMAL HIGH (ref 6.5–8.1)

## 2019-08-06 LAB — CBC WITH DIFFERENTIAL (CANCER CENTER ONLY)
Abs Immature Granulocytes: 0.02 10*3/uL (ref 0.00–0.07)
Basophils Absolute: 0 10*3/uL (ref 0.0–0.1)
Basophils Relative: 0 %
Eosinophils Absolute: 0 10*3/uL (ref 0.0–0.5)
Eosinophils Relative: 0 %
HCT: 41.6 % (ref 39.0–52.0)
Hemoglobin: 14.2 g/dL (ref 13.0–17.0)
Immature Granulocytes: 0 %
Lymphocytes Relative: 9 %
Lymphs Abs: 0.6 10*3/uL — ABNORMAL LOW (ref 0.7–4.0)
MCH: 30.5 pg (ref 26.0–34.0)
MCHC: 34.1 g/dL (ref 30.0–36.0)
MCV: 89.5 fL (ref 80.0–100.0)
Monocytes Absolute: 0.1 10*3/uL (ref 0.1–1.0)
Monocytes Relative: 1 %
Neutro Abs: 6.4 10*3/uL (ref 1.7–7.7)
Neutrophils Relative %: 90 %
Platelet Count: 295 10*3/uL (ref 150–400)
RBC: 4.65 MIL/uL (ref 4.22–5.81)
RDW: 15 % (ref 11.5–15.5)
WBC Count: 7.2 10*3/uL (ref 4.0–10.5)
nRBC: 0 % (ref 0.0–0.2)

## 2019-08-06 NOTE — Progress Notes (Signed)
Hematology and Oncology Follow Up Visit  James [name] [id number] [birth year] 74 y.o. 08/06/2019 3:37 PM James [name], James [name], MDEhinger, James [place], MD   Principle Diagnosis: 74 year old man with IgA kappa multiple myeloma diagnosed in June 2017.  He was found to 30% plasma cell in the bone marrow with anemia.   Prior therapy:  Revlimid and dexamethasone started on January 28 2016. Revlimid at 25 mg daily for 21 days with dexamethasone at 20 mg weekly. Therapy discontinued in January 2018 after achieving a complete response.  He developed relapsed disease in April 2020 with M spike of 3.9 g/dL IgM level of 4707.  Current therapy: Revlimid 15 mg daily for 21 days out of a 28 day cycle with dexamethasone 40 mg weekly started in May 2020.     Interim History:  [name] returns today for a follow-up visit.  Since the last visit, he reports no major changes in his health.  He has tolerated Revlimid reasonably well and has been on a week off for the last cycle.  He did have a loose bowel habits and a worsening diarrhea last few days off Revlimid.  He denies any abdominal pain or distention.  He did report taking Imodium which have helped with his symptoms.  Denies any nausea or vomiting.  Denies any skin rashes or lesions.                 .    Medications: Reviewed without any changes.  Current Outpatient Medications  Medication Sig Dispense Refill  . amLODipine (NORVASC) 10 MG tablet Take 10 mg by mouth every morning.     Marland Kitchen dexamethasone (DECADRON) 4 MG tablet Take 5 tablets weekly. 100 tablet 3  . ferrous sulfate 325 (65 FE) MG tablet Take 325 mg by mouth 2 (two) times daily with a meal.     . finasteride (PROSCAR) 5 MG tablet Take 5 mg by mouth daily.    Marland Kitchen gabapentin (NEURONTIN) 400 MG capsule Take 400 mg by mouth 3 (three) times daily.     Marland Kitchen glimepiride (AMARYL) 2 MG tablet Take 2 mg by mouth daily.    Marland Kitchen HYDROcodone-acetaminophen (NORCO) 10-325 MG tablet Take 1 tablet by  mouth 3 (three) times daily.     Marland Kitchen HYSINGLA ER 60 MG T24A Take 1 tablet by mouth daily.    Marland Kitchen lenalidomide (REVLIMID) 15 MG capsule Take 1 capsule by mouth daily for 21 days then 7 days off, repeat every 28 days. Auth# [id number]  on 06/20/19 21 capsule 0  . losartan (COZAAR) 50 MG tablet Take 50 mg by mouth every morning.     . metFORMIN (GLUCOPHAGE-XR) 500 MG 24 hr tablet Take 1,000 mg by mouth 2 (two) times daily.     Marland Kitchen omeprazole (PRILOSEC) 20 MG capsule Take 20 mg by mouth 2 (two) times daily before a meal.     . Tamsulosin HCl (FLOMAX) 0.4 MG CAPS Take 0.4 mg by mouth 2 (two) times daily.     . traMADol (ULTRAM) 50 MG tablet Take 50 mg by mouth 4 (four) times daily -  with meals and at bedtime. For pain     No current facility-administered medications for this visit.    Allergies:  Allergies  Allergen Reactions  . Invokana [Canagliflozin] Anaphylaxis    Facial and lip swelling    Past Medical History, Surgical history, Social history, and Family History unchanged on review.   Physical Exam:   Blood pressure (!) 194/92, pulse (!) 117,  temperature 99 F (37.2 C), temperature source Temporal, resp. rate 20, height _0  (1.854 m), weight 189 lb 9.6 oz (86 kg), SpO2 99 %.       ECOG: 1     General appearance: Alert, awake without any distress. Head: Atraumatic without abnormalities Oropharynx: Without any thrush or ulcers. Eyes: No scleral icterus. Lymph nodes: No lymphadenopathy noted in the cervical, supraclavicular, or axillary nodes Heart:regular rate and rhythm, without any murmurs or gallops.   Lung: Clear to auscultation without any rhonchi, wheezes or dullness to percussion. Abdomin: Soft, nontender without any shifting dullness or ascites. Musculoskeletal: No clubbing or cyanosis. Neurological: No motor or sensory deficits. Skin: No rashes or lesions. Psychiatric: Mood and affect appeared normal.  .      Lab Results: Lab Results  Component Value Date    WBC 11.0 (H) 06/20/2019   HGB 12.4 (L) 06/20/2019   HCT 36.8 (L) 06/20/2019   MCV 87.4 06/20/2019   PLT 289 06/20/2019     Chemistry      Component Value Date/Time   NA 138 06/20/2019 1247   NA 138 06/26/2017 0946   K 3.4 (L) 06/20/2019 1247   K 3.8 06/26/2017 0946   CL 101 06/20/2019 1247   CL 105 08/09/2012 0918   CO2 23 06/20/2019 1247   CO2 25 06/26/2017 0946   BUN 17 06/20/2019 1247   BUN 16.4 06/26/2017 0946   CREATININE 1.04 06/20/2019 1247   CREATININE 1.2 06/26/2017 0946      Component Value Date/Time   CALCIUM 9.3 06/20/2019 1247   CALCIUM 9.3 06/26/2017 0946   ALKPHOS 124 06/20/2019 1247   ALKPHOS 75 06/26/2017 0946   AST 11 (L) 06/20/2019 1247   AST 11 06/26/2017 0946   ALT 16 06/20/2019 1247   ALT 14 06/26/2017 0946   BILITOT 0.4 06/20/2019 1247   BILITOT 0.31 06/26/2017 0946        Results for James, BENJIE [name] (MRN [id number]) as of 08/06/2019 15:37  Ref. Range 05/06/2019 11:50 06/20/2019 12:47  M Protein SerPl Elph-Mcnc Latest Ref Range: Not Observed g/dL 0.8 (H) 0.5 (H)     Impression and Plan:  74 year old with:  1.  Multiple myeloma diagnosed in 2017.  He was found to have IgA kappa subtype with anemia and 30% involvement of the bone marrow.   The natural course of this disease was updated again.  Protein studies from November 2020 were reviewed again continues to show decrease in his IgA level as well as near normalization of his M protein.  Risks and benefits of continuing this therapy was discussed.  He remains on Revlimid without any specific complications at this time.  Potential issues associated with this medication were reviewed.  These would include thrombosis, neuropathy and dermatological toxicities.  At this time he is agreeable to continue.     2.  Chronic back and hip pain: Unrelated to his multiple myeloma.  He has chronic pain related to spinal stenosis and arthritis.  3.  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   Hemoglobin close to normal range at this time.  4.  Diarrhea: It does not appear to be related to his Revlimid.  Could be related to gastroenteritis among other causes.  He has follow-up with his primary care physician future.   5.  Follow-up: In 6 weeks for repeat evaluation.  30  minutes was spent on this encounter today.  That time was dedicated to reviewing laboratory data, assessing disease status  update, complications of therapy and future plan of care.   Zola Button, MD 1/6/20213:37 PM

## 2019-08-07 ENCOUNTER — Telehealth: Payer: Self-pay | Admitting: Oncology

## 2019-08-07 LAB — KAPPA/LAMBDA LIGHT CHAINS
Kappa free light chain: 19.3 mg/L (ref 3.3–19.4)
Kappa, lambda light chain ratio: 1.71 — ABNORMAL HIGH (ref 0.26–1.65)
Lambda free light chains: 11.3 mg/L (ref 5.7–26.3)

## 2019-08-07 NOTE — Telephone Encounter (Signed)
Scheduled appt per 1/6 los.  Sent a message to HIM pool to get a calendar mailed out. 

## 2019-08-08 LAB — MULTIPLE MYELOMA PANEL, SERUM
Albumin SerPl Elph-Mcnc: 4 g/dL (ref 2.9–4.4)
Albumin/Glob SerPl: 1.2 (ref 0.7–1.7)
Alpha 1: 0.3 g/dL (ref 0.0–0.4)
Alpha2 Glob SerPl Elph-Mcnc: 0.9 g/dL (ref 0.4–1.0)
B-Globulin SerPl Elph-Mcnc: 1.7 g/dL — ABNORMAL HIGH (ref 0.7–1.3)
Gamma Glob SerPl Elph-Mcnc: 0.6 g/dL (ref 0.4–1.8)
Globulin, Total: 3.4 g/dL (ref 2.2–3.9)
IgA: 929 mg/dL — ABNORMAL HIGH (ref 61–437)
IgG (Immunoglobin G), Serum: 722 mg/dL (ref 603–1613)
IgM (Immunoglobulin M), Srm: 13 mg/dL — ABNORMAL LOW (ref 15–143)
M Protein SerPl Elph-Mcnc: 0.5 g/dL — ABNORMAL HIGH
Total Protein ELP: 7.4 g/dL (ref 6.0–8.5)

## 2019-08-13 DIAGNOSIS — M48062 Spinal stenosis, lumbar region with neurogenic claudication: Secondary | ICD-10-CM | POA: Diagnosis not present

## 2019-08-14 DIAGNOSIS — E782 Mixed hyperlipidemia: Secondary | ICD-10-CM | POA: Diagnosis not present

## 2019-08-14 DIAGNOSIS — I10 Essential (primary) hypertension: Secondary | ICD-10-CM | POA: Diagnosis not present

## 2019-08-14 DIAGNOSIS — R809 Proteinuria, unspecified: Secondary | ICD-10-CM | POA: Diagnosis not present

## 2019-08-14 DIAGNOSIS — N400 Enlarged prostate without lower urinary tract symptoms: Secondary | ICD-10-CM | POA: Diagnosis not present

## 2019-08-14 DIAGNOSIS — K219 Gastro-esophageal reflux disease without esophagitis: Secondary | ICD-10-CM | POA: Diagnosis not present

## 2019-08-14 DIAGNOSIS — E1129 Type 2 diabetes mellitus with other diabetic kidney complication: Secondary | ICD-10-CM | POA: Diagnosis not present

## 2019-08-14 DIAGNOSIS — G8929 Other chronic pain: Secondary | ICD-10-CM | POA: Diagnosis not present

## 2019-08-14 DIAGNOSIS — C9000 Multiple myeloma not having achieved remission: Secondary | ICD-10-CM | POA: Diagnosis not present

## 2019-08-16 DIAGNOSIS — G8929 Other chronic pain: Secondary | ICD-10-CM | POA: Diagnosis not present

## 2019-08-16 DIAGNOSIS — C9000 Multiple myeloma not having achieved remission: Secondary | ICD-10-CM | POA: Diagnosis not present

## 2019-08-16 DIAGNOSIS — Z7409 Other reduced mobility: Secondary | ICD-10-CM | POA: Diagnosis not present

## 2019-08-20 DIAGNOSIS — I2510 Atherosclerotic heart disease of native coronary artery without angina pectoris: Secondary | ICD-10-CM | POA: Diagnosis not present

## 2019-08-20 DIAGNOSIS — C9000 Multiple myeloma not having achieved remission: Secondary | ICD-10-CM | POA: Diagnosis not present

## 2019-08-20 DIAGNOSIS — N400 Enlarged prostate without lower urinary tract symptoms: Secondary | ICD-10-CM | POA: Diagnosis not present

## 2019-08-20 DIAGNOSIS — G8929 Other chronic pain: Secondary | ICD-10-CM | POA: Diagnosis not present

## 2019-08-20 DIAGNOSIS — I509 Heart failure, unspecified: Secondary | ICD-10-CM | POA: Diagnosis not present

## 2019-08-20 DIAGNOSIS — N529 Male erectile dysfunction, unspecified: Secondary | ICD-10-CM | POA: Diagnosis not present

## 2019-08-20 DIAGNOSIS — I110 Hypertensive heart disease with heart failure: Secondary | ICD-10-CM | POA: Diagnosis not present

## 2019-08-20 DIAGNOSIS — E1142 Type 2 diabetes mellitus with diabetic polyneuropathy: Secondary | ICD-10-CM | POA: Diagnosis not present

## 2019-08-20 DIAGNOSIS — M2550 Pain in unspecified joint: Secondary | ICD-10-CM | POA: Diagnosis not present

## 2019-08-20 DIAGNOSIS — K219 Gastro-esophageal reflux disease without esophagitis: Secondary | ICD-10-CM | POA: Diagnosis not present

## 2019-08-27 ENCOUNTER — Other Ambulatory Visit: Payer: Self-pay

## 2019-08-27 DIAGNOSIS — C9001 Multiple myeloma in remission: Secondary | ICD-10-CM

## 2019-08-27 MED ORDER — LENALIDOMIDE 15 MG PO CAPS: ORAL_CAPSULE | ORAL | 0 refills | Status: DC

## 2019-09-10 DIAGNOSIS — I10 Essential (primary) hypertension: Secondary | ICD-10-CM | POA: Diagnosis not present

## 2019-09-10 DIAGNOSIS — Z7189 Other specified counseling: Secondary | ICD-10-CM | POA: Diagnosis not present

## 2019-09-10 DIAGNOSIS — Z6827 Body mass index (BMI) 27.0-27.9, adult: Secondary | ICD-10-CM | POA: Diagnosis not present

## 2019-09-10 DIAGNOSIS — C9002 Multiple myeloma in relapse: Secondary | ICD-10-CM | POA: Diagnosis not present

## 2019-09-10 DIAGNOSIS — E1165 Type 2 diabetes mellitus with hyperglycemia: Secondary | ICD-10-CM | POA: Diagnosis not present

## 2019-09-10 DIAGNOSIS — E785 Hyperlipidemia, unspecified: Secondary | ICD-10-CM | POA: Diagnosis not present

## 2019-09-16 ENCOUNTER — Telehealth: Payer: Self-pay

## 2019-09-16 ENCOUNTER — Other Ambulatory Visit: Payer: Self-pay | Admitting: Oncology

## 2019-09-16 ENCOUNTER — Other Ambulatory Visit: Payer: Self-pay

## 2019-09-16 DIAGNOSIS — G8929 Other chronic pain: Secondary | ICD-10-CM | POA: Diagnosis not present

## 2019-09-16 DIAGNOSIS — Z7409 Other reduced mobility: Secondary | ICD-10-CM | POA: Diagnosis not present

## 2019-09-16 DIAGNOSIS — C9000 Multiple myeloma not having achieved remission: Secondary | ICD-10-CM | POA: Diagnosis not present

## 2019-09-16 MED ORDER — DIPHENOXYLATE-ATROPINE 2.5-0.025 MG PO TABS: 1.0000 | ORAL_TABLET | Freq: Two times a day (BID) | ORAL | 0 refills | Status: DC | PRN

## 2019-09-16 NOTE — Telephone Encounter (Signed)
-----   Message from Wyatt Portela, MD sent at 09/16/2019 12:01 PM EST ----- Regarding: RE: Patient Concern Lomotil bid as needed.  He is to call his gastroenterologist as well for evaluation.  Thanks ----- Message ----- From: Teodoro Spray, RN Sent: 09/16/2019  11:51 AM EST To: Wyatt Portela, MD Subject: RE: Patient Concern                            Patient now states he has been taking immodium but that has not helped.   Please advise.  ----- Message ----- From: Wyatt Portela, MD Sent: 09/16/2019  11:36 AM EST To: Teodoro Spray, RN Subject: RE: Patient Concern                            He continues Imodium as needed.  Thanks ----- Message ----- From: Teodoro Spray, RN Sent: 09/16/2019  11:29 AM EST To: Wyatt Portela, MD Subject: Patient Concern                                Patient called office stating he has had bouts of diarrhea for the last couple of days. Patient states he had mentioned this to you during his last appointment, but has not follow-up with his PCP as you had advised. Patient states he no longer has a PCP. Patient's last dose of Revlimid was this AM.  Patient has not taken anything OTC for diarrhea.  Please advise.

## 2019-09-16 NOTE — Telephone Encounter (Signed)
Patient called and informed of information below. Prescription faxed to patient's pharmacy. Patient verbalized understanding and stated he is working on getting a follow-up scheduled. Patient informed to call office with any additional questions or concerns.

## 2019-09-18 ENCOUNTER — Inpatient Hospital Stay: Payer: Medicare HMO | Attending: Oncology

## 2019-09-18 ENCOUNTER — Other Ambulatory Visit: Payer: Self-pay

## 2019-09-18 DIAGNOSIS — D649 Anemia, unspecified: Secondary | ICD-10-CM | POA: Insufficient documentation

## 2019-09-18 DIAGNOSIS — C9002 Multiple myeloma in relapse: Secondary | ICD-10-CM | POA: Insufficient documentation

## 2019-09-18 DIAGNOSIS — Z79899 Other long term (current) drug therapy: Secondary | ICD-10-CM | POA: Insufficient documentation

## 2019-09-18 DIAGNOSIS — R197 Diarrhea, unspecified: Secondary | ICD-10-CM | POA: Diagnosis not present

## 2019-09-18 DIAGNOSIS — M1990 Unspecified osteoarthritis, unspecified site: Secondary | ICD-10-CM | POA: Diagnosis not present

## 2019-09-18 DIAGNOSIS — M4800 Spinal stenosis, site unspecified: Secondary | ICD-10-CM | POA: Insufficient documentation

## 2019-09-18 DIAGNOSIS — G8929 Other chronic pain: Secondary | ICD-10-CM | POA: Diagnosis not present

## 2019-09-18 DIAGNOSIS — C9001 Multiple myeloma in remission: Secondary | ICD-10-CM

## 2019-09-18 LAB — CBC WITH DIFFERENTIAL (CANCER CENTER ONLY)
Abs Immature Granulocytes: 0.04 10*3/uL (ref 0.00–0.07)
Basophils Absolute: 0 10*3/uL (ref 0.0–0.1)
Basophils Relative: 0 %
Eosinophils Absolute: 0 10*3/uL (ref 0.0–0.5)
Eosinophils Relative: 0 %
HCT: 39.6 % (ref 39.0–52.0)
Hemoglobin: 13.1 g/dL (ref 13.0–17.0)
Immature Granulocytes: 0 %
Lymphocytes Relative: 10 %
Lymphs Abs: 1.1 10*3/uL (ref 0.7–4.0)
MCH: 30.9 pg (ref 26.0–34.0)
MCHC: 33.1 g/dL (ref 30.0–36.0)
MCV: 93.4 fL (ref 80.0–100.0)
Monocytes Absolute: 1.2 10*3/uL — ABNORMAL HIGH (ref 0.1–1.0)
Monocytes Relative: 11 %
Neutro Abs: 8.7 10*3/uL — ABNORMAL HIGH (ref 1.7–7.7)
Neutrophils Relative %: 79 %
Platelet Count: 277 10*3/uL (ref 150–400)
RBC: 4.24 MIL/uL (ref 4.22–5.81)
RDW: 14.6 % (ref 11.5–15.5)
WBC Count: 11.1 10*3/uL — ABNORMAL HIGH (ref 4.0–10.5)
nRBC: 0 % (ref 0.0–0.2)

## 2019-09-18 LAB — CMP (CANCER CENTER ONLY)
ALT: 13 U/L (ref 0–44)
AST: 9 U/L — ABNORMAL LOW (ref 15–41)
Albumin: 4.3 g/dL (ref 3.5–5.0)
Alkaline Phosphatase: 91 U/L (ref 38–126)
Anion gap: 13 (ref 5–15)
BUN: 19 mg/dL (ref 8–23)
CO2: 24 mmol/L (ref 22–32)
Calcium: 9.6 mg/dL (ref 8.9–10.3)
Chloride: 103 mmol/L (ref 98–111)
Creatinine: 1.2 mg/dL (ref 0.61–1.24)
GFR, Est AFR Am: >60 mL/min (ref >60)
GFR, Estimated: 59 mL/min — ABNORMAL LOW (ref >60)
Glucose, Bld: 143 mg/dL — ABNORMAL HIGH (ref 70–99)
Potassium: 4 mmol/L (ref 3.5–5.1)
Sodium: 140 mmol/L (ref 135–145)
Total Bilirubin: 0.4 mg/dL (ref 0.3–1.2)
Total Protein: 7.6 g/dL (ref 6.5–8.1)

## 2019-09-19 LAB — KAPPA/LAMBDA LIGHT CHAINS
Kappa free light chain: 15.8 mg/L (ref 3.3–19.4)
Kappa, lambda light chain ratio: 1.23 (ref 0.26–1.65)
Lambda free light chains: 12.8 mg/L (ref 5.7–26.3)

## 2019-09-22 LAB — MULTIPLE MYELOMA PANEL, SERUM
Albumin SerPl Elph-Mcnc: 4 g/dL (ref 2.9–4.4)
Albumin/Glob SerPl: 1.5 (ref 0.7–1.7)
Alpha 1: 0.2 g/dL (ref 0.0–0.4)
Alpha2 Glob SerPl Elph-Mcnc: 0.8 g/dL (ref 0.4–1.0)
B-Globulin SerPl Elph-Mcnc: 1.2 g/dL (ref 0.7–1.3)
Gamma Glob SerPl Elph-Mcnc: 0.5 g/dL (ref 0.4–1.8)
Globulin, Total: 2.8 g/dL (ref 2.2–3.9)
IgA: 442 mg/dL — ABNORMAL HIGH (ref 61–437)
IgG (Immunoglobin G), Serum: 605 mg/dL (ref 603–1613)
IgM (Immunoglobulin M), Srm: 12 mg/dL — ABNORMAL LOW (ref 15–143)
Total Protein ELP: 6.8 g/dL (ref 6.0–8.5)

## 2019-09-25 ENCOUNTER — Other Ambulatory Visit: Payer: Self-pay

## 2019-09-25 ENCOUNTER — Inpatient Hospital Stay (HOSPITAL_BASED_OUTPATIENT_CLINIC_OR_DEPARTMENT_OTHER): Payer: Medicare HMO | Admitting: Oncology

## 2019-09-25 VITALS — BP 158/67 | HR 99 | Temp 98.9°F | Resp 18 | Ht 73.0 in | Wt 194.9 lb

## 2019-09-25 DIAGNOSIS — C9001 Multiple myeloma in remission: Secondary | ICD-10-CM

## 2019-09-25 DIAGNOSIS — C9002 Multiple myeloma in relapse: Secondary | ICD-10-CM | POA: Diagnosis not present

## 2019-09-25 DIAGNOSIS — D649 Anemia, unspecified: Secondary | ICD-10-CM | POA: Diagnosis not present

## 2019-09-25 DIAGNOSIS — Z79899 Other long term (current) drug therapy: Secondary | ICD-10-CM | POA: Diagnosis not present

## 2019-09-25 DIAGNOSIS — R197 Diarrhea, unspecified: Secondary | ICD-10-CM | POA: Diagnosis not present

## 2019-09-25 DIAGNOSIS — M4800 Spinal stenosis, site unspecified: Secondary | ICD-10-CM | POA: Diagnosis not present

## 2019-09-25 DIAGNOSIS — G8929 Other chronic pain: Secondary | ICD-10-CM | POA: Diagnosis not present

## 2019-09-25 DIAGNOSIS — M1990 Unspecified osteoarthritis, unspecified site: Secondary | ICD-10-CM | POA: Diagnosis not present

## 2019-09-25 MED ORDER — DIPHENOXYLATE-ATROPINE 2.5-0.025 MG PO TABS: 1.0000 | ORAL_TABLET | Freq: Two times a day (BID) | ORAL | 0 refills | Status: DC | PRN

## 2019-09-25 MED ORDER — LENALIDOMIDE 15 MG PO CAPS: ORAL_CAPSULE | ORAL | 0 refills | Status: DC

## 2019-09-25 NOTE — Progress Notes (Signed)
Hematology and Oncology Follow Up Visit  James [name] [id number] Dec 18, 1945 74 y.o. 09/25/2019 3:30 PM James [name], MDEhinger, James [name]   Principle Diagnosis: 74 year old man with multiple myeloma diagnosed in June 2017.  He was found to have IgA kappa subtype with bone marrow involvement close to 30%.   Prior therapy:  Revlimid and dexamethasone started on January 28 2016. Revlimid at 25 mg daily for 21 days with dexamethasone at 20 mg weekly. Therapy discontinued in January 2018 after achieving a complete response.  He developed relapsed disease in April 2020 with M spike of 3.9 g/dL IgM level of 4707.  Current therapy: Revlimid 15 mg daily for 21 days out of a 28 day cycle with dexamethasone 40 mg weekly started in May 2020.     Interim History:  James [name] is here for return evaluation.  Since the last visit, he reports no major changes in his health.  He continues to have issues with diarrhea although improved with the use of Imodium and Lomotil at times.  He denies any abdominal pain or weight loss.  Continues to eat reasonably well and his weight is maintained.  Continues to have chronic pain issues which are unchanged at this time.  Denies any complications related to Revlimid at this time.                 .    Medications: Updated without any changes.  Current Outpatient Medications  Medication Sig Dispense Refill  . amLODipine (NORVASC) 10 MG tablet Take 10 mg by mouth every morning.     Marland Kitchen dexamethasone (DECADRON) 4 MG tablet Take 5 tablets weekly. 100 tablet 3  . diphenoxylate-atropine (LOMOTIL) 2.5-0.025 MG tablet Take 1 tablet by mouth 2 (two) times daily as needed for diarrhea or loose stools. 30 tablet 0  . ferrous sulfate 325 (65 FE) MG tablet Take 325 mg by mouth 2 (two) times daily with a meal.     . finasteride (PROSCAR) 5 MG tablet Take 5 mg by mouth daily.    Marland Kitchen gabapentin (NEURONTIN) 400 MG capsule Take 400 mg by mouth 3 (three) times  daily.     Marland Kitchen glimepiride (AMARYL) 2 MG tablet Take 2 mg by mouth daily.    Marland Kitchen HYDROcodone-acetaminophen (NORCO) 10-325 MG tablet Take 1 tablet by mouth 3 (three) times daily.     Marland Kitchen HYSINGLA ER 60 MG T24A Take 1 tablet by mouth daily.    Marland Kitchen lenalidomide (REVLIMID) 15 MG capsule Take 1 capsule by mouth daily for 21 days then 7 days off, repeat every 28 days. Auth# [id number]  on 06/20/19 21 capsule 0  . losartan (COZAAR) 50 MG tablet Take 50 mg by mouth every morning.     . metFORMIN (GLUCOPHAGE-XR) 500 MG 24 hr tablet Take 1,000 mg by mouth 2 (two) times daily.     Marland Kitchen omeprazole (PRILOSEC) 20 MG capsule Take 20 mg by mouth 2 (two) times daily before a meal.     . Tamsulosin HCl (FLOMAX) 0.4 MG CAPS Take 0.4 mg by mouth 2 (two) times daily.     . traMADol (ULTRAM) 50 MG tablet Take 50 mg by mouth 4 (four) times daily -  with meals and at bedtime. For pain     No current facility-administered medications for this visit.    Allergies:  Allergies  Allergen Reactions  . Invokana [Canagliflozin] Anaphylaxis    Facial and lip swelling      Physical Exam:  Blood pressure (!) 158/67, pulse 99, temperature 98.9 F (37.2 C), temperature source Temporal, resp. rate 18, height '6\' 1"'$  (1.854 m), weight 194 lb 14.4 oz (88.4 kg), SpO2 99 %.      ECOG: 1   General appearance: Comfortable appearing without any discomfort Head: Normocephalic without any trauma Oropharynx: Mucous membranes are moist and pink without any thrush or ulcers. Eyes: Pupils are equal and round reactive to light. Lymph nodes: No cervical, supraclavicular, inguinal or axillary lymphadenopathy.   Heart:regular rate and rhythm.  S1 and S2 without leg edema. Lung: Clear without any rhonchi or wheezes.  No dullness to percussion. Abdomin: Soft, nontender, nondistended with good bowel sounds.  No hepatosplenomegaly. Musculoskeletal: No joint deformity or effusion.  Full range of motion noted. Neurological: No deficits noted  on motor, sensory and deep tendon reflex exam. Skin: No petechial rash or dryness.  Appeared moist.    .      Lab Results: Lab Results  Component Value Date   WBC 11.1 (H) 09/18/2019   HGB 13.1 09/18/2019   HCT 39.6 09/18/2019   MCV 93.4 09/18/2019   PLT 277 09/18/2019     Chemistry      Component Value Date/Time   NA 140 09/18/2019 1303   NA 138 06/26/2017 0946   K 4.0 09/18/2019 1303   K 3.8 06/26/2017 0946   CL 103 09/18/2019 1303   CL 105 08/09/2012 0918   CO2 24 09/18/2019 1303   CO2 25 06/26/2017 0946   BUN 19 09/18/2019 1303   BUN 16.4 06/26/2017 0946   CREATININE 1.20 09/18/2019 1303   CREATININE 1.2 06/26/2017 0946      Component Value Date/Time   CALCIUM 9.6 09/18/2019 1303   CALCIUM 9.3 06/26/2017 0946   ALKPHOS 91 09/18/2019 1303   ALKPHOS 75 06/26/2017 0946   AST 9 (L) 09/18/2019 1303   AST 11 06/26/2017 0946   ALT 13 09/18/2019 1303   ALT 14 06/26/2017 0946   BILITOT 0.4 09/18/2019 1303   BILITOT 0.31 06/26/2017 0946       Results for [name], James FEICK (MRN [id number]) as of 09/25/2019 14:57  Ref. Range 08/06/2019 16:05 09/18/2019 13:04  Gamma Glob SerPl Elph-Mcnc Latest Ref Range: 0.4 - 1.8 g/dL 0.6 0.5  M Protein SerPl Elph-Mcnc Latest Ref Range: Not Observed g/dL 0.5 (H) Comment (A)  IFE 1 Unknown Comment (A) Comment (A)  Globulin, Total Latest Ref Range: 2.2 - 3.9 g/dL 3.4 2.8  B-Globulin SerPl Elph-Mcnc Latest Ref Range: 0.7 - 1.3 g/dL 1.7 (H) 1.2  IgG (Immunoglobin G), Serum Latest Ref Range: 603 - 1,613 mg/dL 722 605  IgM (Immunoglobulin M), Srm Latest Ref Range: 15 - 143 mg/dL 13 (L) 12 (L)  IgA Latest Ref Range: 61 - 437 mg/dL 929 (H) 442 (H)       Impression and Plan:  74 year old with:  1.  IgG kappa multiple myeloma diagnosed in 2017.  He was found to have worsening anemia and bone marrow involvement.    He is currently on Revlimid and dexamethasone induction with excellent response so far.  Protein studies obtained on  February 18 was reviewed today and showed near resolution of his M spike with IgA level nearly normalizing.  His kappa and lambda light chains are all within normal range at this time with normal ratio.  Risks and benefits of continuing this therapy versus switching to a lower dose of Revlimid was reviewed.  After discussion today, we opted to continue  with the same dose and schedule for the time being to consolidate his remission before switching to maintenance.     2.  Chronic back and hip pain: Related to arthritis and no evidence of malignancy at this time.  3.  Anemia: Resolved at this time after treating his plasma cell disorder.  Hemoglobin is back to normal.  4.  Diarrhea: Unclear etiology at this time.  Could be related to Revlimid seems to be manageable at this time with Lomotil.  Of asked him to follow-up with gastroenterology at this time.   5.  Follow-up: He will return in 6 weeks for repeat evaluation.  30  minutes were dedicated to the visit today.  The time was spent on reviewing his laboratory data, disease status update, discussing treatment options and future plan of care reviewed.   Zola Button, [name] 2/25/20213:30 PM

## 2019-09-26 ENCOUNTER — Telehealth: Payer: Self-pay | Admitting: Oncology

## 2019-09-26 NOTE — Telephone Encounter (Signed)
Scheduled appt per 2/25 los.  Sent a message to HIM pool to get a calendar mailed out

## 2019-09-29 DIAGNOSIS — Z87891 Personal history of nicotine dependence: Secondary | ICD-10-CM | POA: Diagnosis not present

## 2019-09-29 DIAGNOSIS — R0602 Shortness of breath: Secondary | ICD-10-CM | POA: Diagnosis not present

## 2019-09-29 DIAGNOSIS — J441 Chronic obstructive pulmonary disease with (acute) exacerbation: Secondary | ICD-10-CM | POA: Diagnosis not present

## 2019-10-02 DIAGNOSIS — I10 Essential (primary) hypertension: Secondary | ICD-10-CM | POA: Diagnosis not present

## 2019-10-02 DIAGNOSIS — Z7189 Other specified counseling: Secondary | ICD-10-CM | POA: Diagnosis not present

## 2019-10-02 DIAGNOSIS — Z6827 Body mass index (BMI) 27.0-27.9, adult: Secondary | ICD-10-CM | POA: Diagnosis not present

## 2019-10-02 DIAGNOSIS — Z87891 Personal history of nicotine dependence: Secondary | ICD-10-CM | POA: Diagnosis not present

## 2019-10-02 DIAGNOSIS — E785 Hyperlipidemia, unspecified: Secondary | ICD-10-CM | POA: Diagnosis not present

## 2019-10-02 DIAGNOSIS — E1165 Type 2 diabetes mellitus with hyperglycemia: Secondary | ICD-10-CM | POA: Diagnosis not present

## 2019-10-02 DIAGNOSIS — J441 Chronic obstructive pulmonary disease with (acute) exacerbation: Secondary | ICD-10-CM | POA: Diagnosis not present

## 2019-10-02 DIAGNOSIS — C9002 Multiple myeloma in relapse: Secondary | ICD-10-CM | POA: Diagnosis not present

## 2019-10-03 ENCOUNTER — Other Ambulatory Visit: Payer: Self-pay | Admitting: Oncology

## 2019-10-03 DIAGNOSIS — J441 Chronic obstructive pulmonary disease with (acute) exacerbation: Secondary | ICD-10-CM | POA: Diagnosis not present

## 2019-10-03 DIAGNOSIS — I10 Essential (primary) hypertension: Secondary | ICD-10-CM | POA: Diagnosis not present

## 2019-10-03 DIAGNOSIS — R0602 Shortness of breath: Secondary | ICD-10-CM | POA: Diagnosis not present

## 2019-10-03 DIAGNOSIS — E1165 Type 2 diabetes mellitus with hyperglycemia: Secondary | ICD-10-CM | POA: Diagnosis not present

## 2019-10-03 DIAGNOSIS — R9431 Abnormal electrocardiogram [ECG] [EKG]: Secondary | ICD-10-CM | POA: Diagnosis not present

## 2019-10-14 DIAGNOSIS — Z7409 Other reduced mobility: Secondary | ICD-10-CM | POA: Diagnosis not present

## 2019-10-14 DIAGNOSIS — C9000 Multiple myeloma not having achieved remission: Secondary | ICD-10-CM | POA: Diagnosis not present

## 2019-10-14 DIAGNOSIS — G8929 Other chronic pain: Secondary | ICD-10-CM | POA: Diagnosis not present

## 2019-10-20 DIAGNOSIS — N400 Enlarged prostate without lower urinary tract symptoms: Secondary | ICD-10-CM | POA: Diagnosis not present

## 2019-10-20 DIAGNOSIS — R252 Cramp and spasm: Secondary | ICD-10-CM | POA: Diagnosis not present

## 2019-10-20 DIAGNOSIS — K625 Hemorrhage of anus and rectum: Secondary | ICD-10-CM | POA: Diagnosis not present

## 2019-10-20 DIAGNOSIS — G629 Polyneuropathy, unspecified: Secondary | ICD-10-CM | POA: Diagnosis not present

## 2019-10-22 ENCOUNTER — Telehealth: Payer: Self-pay

## 2019-10-22 ENCOUNTER — Other Ambulatory Visit: Payer: Self-pay

## 2019-10-22 DIAGNOSIS — E119 Type 2 diabetes mellitus without complications: Secondary | ICD-10-CM | POA: Diagnosis not present

## 2019-10-22 DIAGNOSIS — R0602 Shortness of breath: Secondary | ICD-10-CM | POA: Diagnosis not present

## 2019-10-22 DIAGNOSIS — C9001 Multiple myeloma in remission: Secondary | ICD-10-CM

## 2019-10-22 DIAGNOSIS — R69 Illness, unspecified: Secondary | ICD-10-CM | POA: Diagnosis not present

## 2019-10-22 DIAGNOSIS — R197 Diarrhea, unspecified: Secondary | ICD-10-CM | POA: Diagnosis not present

## 2019-10-22 DIAGNOSIS — K625 Hemorrhage of anus and rectum: Secondary | ICD-10-CM | POA: Diagnosis not present

## 2019-10-22 MED ORDER — LENALIDOMIDE 15 MG PO CAPS: ORAL_CAPSULE | ORAL | 0 refills | Status: DC

## 2019-10-22 NOTE — Telephone Encounter (Signed)
-----   Message from Wyatt Portela, MD sent at 10/22/2019 10:32 AM EDT ----- Noted. Thanks ----- Message ----- From: Tami Lin, RN Sent: 10/22/2019  10:27 AM EDT To: Wyatt Portela, MD  Patient called and states he continues to have diarrhea. He states it has been bloody at times since Saturday and per patient he goes to the bathroom approximately every hour. Patient states he is out of Lomotil that was prescribed on 10/03/19. Per patient he has an appointment with his gastroenterologist today but wanted to make you aware of what has been going on.  Lanelle Bal

## 2019-10-22 NOTE — Telephone Encounter (Signed)
-----   Message from Wyatt Portela, MD sent at 10/22/2019 11:31 AM EDT ----- Yes. Thanks ----- Message ----- From: Tami Lin, RN Sent: 10/22/2019  11:25 AM EDT To: Wyatt Portela, MD  [facility] is requesting a refill for Revlimid. Ok to refill? Lanelle Bal

## 2019-10-22 NOTE — Telephone Encounter (Signed)
Called patient and let him know that Dr. Alen Blew has been made aware of his concerns. Patient stated he will update this office after his visit with the gastroenterologist today.

## 2019-10-22 NOTE — Telephone Encounter (Signed)
Refill for Revlimid sent to [facility].

## 2019-10-23 DIAGNOSIS — J441 Chronic obstructive pulmonary disease with (acute) exacerbation: Secondary | ICD-10-CM | POA: Diagnosis not present

## 2019-10-23 DIAGNOSIS — E1165 Type 2 diabetes mellitus with hyperglycemia: Secondary | ICD-10-CM | POA: Diagnosis not present

## 2019-10-23 DIAGNOSIS — R0602 Shortness of breath: Secondary | ICD-10-CM | POA: Diagnosis not present

## 2019-10-24 DIAGNOSIS — J441 Chronic obstructive pulmonary disease with (acute) exacerbation: Secondary | ICD-10-CM | POA: Diagnosis not present

## 2019-10-24 DIAGNOSIS — J449 Chronic obstructive pulmonary disease, unspecified: Secondary | ICD-10-CM | POA: Diagnosis not present

## 2019-10-27 DIAGNOSIS — J441 Chronic obstructive pulmonary disease with (acute) exacerbation: Secondary | ICD-10-CM | POA: Diagnosis not present

## 2019-10-27 DIAGNOSIS — R350 Frequency of micturition: Secondary | ICD-10-CM | POA: Diagnosis not present

## 2019-10-27 DIAGNOSIS — R310 Gross hematuria: Secondary | ICD-10-CM | POA: Diagnosis not present

## 2019-10-27 DIAGNOSIS — J449 Chronic obstructive pulmonary disease, unspecified: Secondary | ICD-10-CM | POA: Diagnosis not present

## 2019-10-29 DIAGNOSIS — H16223 Keratoconjunctivitis sicca, not specified as Sjogren's, bilateral: Secondary | ICD-10-CM | POA: Diagnosis not present

## 2019-10-29 DIAGNOSIS — H18413 Arcus senilis, bilateral: Secondary | ICD-10-CM | POA: Diagnosis not present

## 2019-10-29 DIAGNOSIS — H0288A Meibomian gland dysfunction right eye, upper and lower eyelids: Secondary | ICD-10-CM | POA: Diagnosis not present

## 2019-10-29 DIAGNOSIS — H11153 Pinguecula, bilateral: Secondary | ICD-10-CM | POA: Diagnosis not present

## 2019-10-29 DIAGNOSIS — Z79899 Other long term (current) drug therapy: Secondary | ICD-10-CM | POA: Diagnosis not present

## 2019-10-29 DIAGNOSIS — H40013 Open angle with borderline findings, low risk, bilateral: Secondary | ICD-10-CM | POA: Diagnosis not present

## 2019-10-29 DIAGNOSIS — E119 Type 2 diabetes mellitus without complications: Secondary | ICD-10-CM | POA: Diagnosis not present

## 2019-10-29 DIAGNOSIS — H11133 Conjunctival pigmentations, bilateral: Secondary | ICD-10-CM | POA: Diagnosis not present

## 2019-10-29 DIAGNOSIS — H0288B Meibomian gland dysfunction left eye, upper and lower eyelids: Secondary | ICD-10-CM | POA: Diagnosis not present

## 2019-11-06 ENCOUNTER — Other Ambulatory Visit: Payer: Self-pay

## 2019-11-06 ENCOUNTER — Inpatient Hospital Stay: Payer: Medicare HMO | Attending: Oncology

## 2019-11-06 DIAGNOSIS — Z7952 Long term (current) use of systemic steroids: Secondary | ICD-10-CM | POA: Diagnosis not present

## 2019-11-06 DIAGNOSIS — Z7984 Long term (current) use of oral hypoglycemic drugs: Secondary | ICD-10-CM | POA: Insufficient documentation

## 2019-11-06 DIAGNOSIS — M25559 Pain in unspecified hip: Secondary | ICD-10-CM | POA: Insufficient documentation

## 2019-11-06 DIAGNOSIS — G8929 Other chronic pain: Secondary | ICD-10-CM | POA: Diagnosis not present

## 2019-11-06 DIAGNOSIS — D649 Anemia, unspecified: Secondary | ICD-10-CM | POA: Diagnosis not present

## 2019-11-06 DIAGNOSIS — Z79899 Other long term (current) drug therapy: Secondary | ICD-10-CM | POA: Insufficient documentation

## 2019-11-06 DIAGNOSIS — R197 Diarrhea, unspecified: Secondary | ICD-10-CM | POA: Diagnosis not present

## 2019-11-06 DIAGNOSIS — C9001 Multiple myeloma in remission: Secondary | ICD-10-CM

## 2019-11-06 DIAGNOSIS — C9002 Multiple myeloma in relapse: Secondary | ICD-10-CM | POA: Insufficient documentation

## 2019-11-06 LAB — CBC WITH DIFFERENTIAL (CANCER CENTER ONLY)
Abs Immature Granulocytes: 0.02 10*3/uL (ref 0.00–0.07)
Basophils Absolute: 0 10*3/uL (ref 0.0–0.1)
Basophils Relative: 0 %
Eosinophils Absolute: 0 10*3/uL (ref 0.0–0.5)
Eosinophils Relative: 0 %
HCT: 36 % — ABNORMAL LOW (ref 39.0–52.0)
Hemoglobin: 12.1 g/dL — ABNORMAL LOW (ref 13.0–17.0)
Immature Granulocytes: 0 %
Lymphocytes Relative: 10 %
Lymphs Abs: 0.9 10*3/uL (ref 0.7–4.0)
MCH: 30.4 pg (ref 26.0–34.0)
MCHC: 33.6 g/dL (ref 30.0–36.0)
MCV: 90.5 fL (ref 80.0–100.0)
Monocytes Absolute: 0.7 10*3/uL (ref 0.1–1.0)
Monocytes Relative: 7 %
Neutro Abs: 7.6 10*3/uL (ref 1.7–7.7)
Neutrophils Relative %: 83 %
Platelet Count: 263 10*3/uL (ref 150–400)
RBC: 3.98 MIL/uL — ABNORMAL LOW (ref 4.22–5.81)
RDW: 15.2 % (ref 11.5–15.5)
WBC Count: 9.2 10*3/uL (ref 4.0–10.5)
nRBC: 0 % (ref 0.0–0.2)

## 2019-11-06 LAB — CMP (CANCER CENTER ONLY)
ALT: 14 U/L (ref 0–44)
AST: 8 U/L — ABNORMAL LOW (ref 15–41)
Albumin: 3.6 g/dL (ref 3.5–5.0)
Alkaline Phosphatase: 94 U/L (ref 38–126)
Anion gap: 13 (ref 5–15)
BUN: 14 mg/dL (ref 8–23)
CO2: 24 mmol/L (ref 22–32)
Calcium: 9.1 mg/dL (ref 8.9–10.3)
Chloride: 103 mmol/L (ref 98–111)
Creatinine: 1.15 mg/dL (ref 0.61–1.24)
GFR, Est AFR Am: >60 mL/min (ref >60)
GFR, Estimated: >60 mL/min (ref >60)
Glucose, Bld: 252 mg/dL — ABNORMAL HIGH (ref 70–99)
Potassium: 3.1 mmol/L — ABNORMAL LOW (ref 3.5–5.1)
Sodium: 140 mmol/L (ref 135–145)
Total Bilirubin: 0.3 mg/dL (ref 0.3–1.2)
Total Protein: 6.8 g/dL (ref 6.5–8.1)

## 2019-11-07 LAB — KAPPA/LAMBDA LIGHT CHAINS
Kappa free light chain: 18.7 mg/L (ref 3.3–19.4)
Kappa, lambda light chain ratio: 1.36 (ref 0.26–1.65)
Lambda free light chains: 13.7 mg/L (ref 5.7–26.3)

## 2019-11-10 LAB — MULTIPLE MYELOMA PANEL, SERUM
Albumin SerPl Elph-Mcnc: 3.6 g/dL (ref 2.9–4.4)
Albumin/Glob SerPl: 1.4 (ref 0.7–1.7)
Alpha 1: 0.2 g/dL (ref 0.0–0.4)
Alpha2 Glob SerPl Elph-Mcnc: 0.9 g/dL (ref 0.4–1.0)
B-Globulin SerPl Elph-Mcnc: 1.1 g/dL (ref 0.7–1.3)
Gamma Glob SerPl Elph-Mcnc: 0.4 g/dL (ref 0.4–1.8)
Globulin, Total: 2.6 g/dL (ref 2.2–3.9)
IgA: 338 mg/dL (ref 61–437)
IgG (Immunoglobin G), Serum: 494 mg/dL — ABNORMAL LOW (ref 603–1613)
IgM (Immunoglobulin M), Srm: 13 mg/dL — ABNORMAL LOW (ref 15–143)
Total Protein ELP: 6.2 g/dL (ref 6.0–8.5)

## 2019-11-13 ENCOUNTER — Inpatient Hospital Stay: Payer: Medicare HMO | Admitting: Oncology

## 2019-11-13 DIAGNOSIS — Z87891 Personal history of nicotine dependence: Secondary | ICD-10-CM | POA: Diagnosis not present

## 2019-11-13 DIAGNOSIS — Z7189 Other specified counseling: Secondary | ICD-10-CM | POA: Diagnosis not present

## 2019-11-13 DIAGNOSIS — I10 Essential (primary) hypertension: Secondary | ICD-10-CM | POA: Diagnosis not present

## 2019-11-13 DIAGNOSIS — C9002 Multiple myeloma in relapse: Secondary | ICD-10-CM | POA: Diagnosis not present

## 2019-11-13 DIAGNOSIS — Z6827 Body mass index (BMI) 27.0-27.9, adult: Secondary | ICD-10-CM | POA: Diagnosis not present

## 2019-11-13 DIAGNOSIS — E785 Hyperlipidemia, unspecified: Secondary | ICD-10-CM | POA: Diagnosis not present

## 2019-11-13 DIAGNOSIS — E1165 Type 2 diabetes mellitus with hyperglycemia: Secondary | ICD-10-CM | POA: Diagnosis not present

## 2019-11-18 ENCOUNTER — Telehealth: Payer: Self-pay | Admitting: Oncology

## 2019-11-18 NOTE — Telephone Encounter (Signed)
Called pt per schedule message and vmail - left message for pt to call back to reschedule appt .

## 2019-11-19 ENCOUNTER — Other Ambulatory Visit: Payer: Self-pay

## 2019-11-19 DIAGNOSIS — C9001 Multiple myeloma in remission: Secondary | ICD-10-CM

## 2019-11-19 DIAGNOSIS — J449 Chronic obstructive pulmonary disease, unspecified: Secondary | ICD-10-CM | POA: Diagnosis not present

## 2019-11-19 DIAGNOSIS — J441 Chronic obstructive pulmonary disease with (acute) exacerbation: Secondary | ICD-10-CM | POA: Diagnosis not present

## 2019-11-19 MED ORDER — LENALIDOMIDE 15 MG PO CAPS: ORAL_CAPSULE | ORAL | 0 refills | Status: DC

## 2019-11-27 ENCOUNTER — Other Ambulatory Visit: Payer: Self-pay

## 2019-11-27 ENCOUNTER — Inpatient Hospital Stay (HOSPITAL_BASED_OUTPATIENT_CLINIC_OR_DEPARTMENT_OTHER): Payer: Medicare HMO | Admitting: Oncology

## 2019-11-27 VITALS — BP 153/74 | HR 100 | Temp 98.9°F | Resp 17 | Ht 73.0 in | Wt 189.6 lb

## 2019-11-27 DIAGNOSIS — Z7984 Long term (current) use of oral hypoglycemic drugs: Secondary | ICD-10-CM | POA: Diagnosis not present

## 2019-11-27 DIAGNOSIS — M25559 Pain in unspecified hip: Secondary | ICD-10-CM | POA: Diagnosis not present

## 2019-11-27 DIAGNOSIS — R197 Diarrhea, unspecified: Secondary | ICD-10-CM | POA: Diagnosis not present

## 2019-11-27 DIAGNOSIS — Z7952 Long term (current) use of systemic steroids: Secondary | ICD-10-CM | POA: Diagnosis not present

## 2019-11-27 DIAGNOSIS — C9002 Multiple myeloma in relapse: Secondary | ICD-10-CM | POA: Diagnosis not present

## 2019-11-27 DIAGNOSIS — G8929 Other chronic pain: Secondary | ICD-10-CM | POA: Diagnosis not present

## 2019-11-27 DIAGNOSIS — D649 Anemia, unspecified: Secondary | ICD-10-CM | POA: Diagnosis not present

## 2019-11-27 DIAGNOSIS — Z79899 Other long term (current) drug therapy: Secondary | ICD-10-CM | POA: Diagnosis not present

## 2019-11-27 DIAGNOSIS — C9001 Multiple myeloma in remission: Secondary | ICD-10-CM | POA: Diagnosis not present

## 2019-11-27 NOTE — Progress Notes (Signed)
Hematology and Oncology Follow Up Visit  James [name] [id number] February 11, 1946 74 y.o. 11/27/2019 4:07 PM James [name], MDEhinger, James [name]   Principle Diagnosis: 74 year old man with IgA kappa multiple myeloma diagnosed in June 2017. He presented with worsening anemia and bone marrow involvement close to 30%.   Prior therapy:  Revlimid and dexamethasone started on January 28 2016. Revlimid at 25 mg daily for 21 days with dexamethasone at 20 mg weekly. Therapy discontinued in January 2018 after achieving a complete response.  He developed relapsed disease in April 2020 with M spike of 3.9 g/dL IgM level of 4707.  Current therapy: Revlimid 15 mg daily for 21 days out of a 28 day cycle with dexamethasone 40 mg weekly started in May 2020.     Interim History:  James [name] returns today for a follow-up visit. Since the last visit, he reports no major changes in his health.  He continues to take Revlimid and dexamethasone without any new complaints.  Continues have issues with chronic diarrhea although it is unclear is related to Revlimid.  Denies any abdominal pain or hematochezia.  Denies any recent hospitalizations or illnesses.                 .    Medications: Reviewed without changes.  Current Outpatient Medications  Medication Sig Dispense Refill  . amLODipine (NORVASC) 10 MG tablet Take 10 mg by mouth every morning.     Marland Kitchen dexamethasone (DECADRON) 4 MG tablet Take 5 tablets weekly. 100 tablet 3  . diphenoxylate-atropine (LOMOTIL) 2.5-0.025 MG tablet TAKE 1 TABLET BY MOUTH TWICE DAILY AS NEEDED FOR  DIARRHEA  OR  LOOSE  STOOLS 30 tablet 0  . ferrous sulfate 325 (65 FE) MG tablet Take 325 mg by mouth 2 (two) times daily with a meal.     . finasteride (PROSCAR) 5 MG tablet Take 5 mg by mouth daily.    Marland Kitchen gabapentin (NEURONTIN) 400 MG capsule Take 400 mg by mouth 3 (three) times daily.     Marland Kitchen glimepiride (AMARYL) 2 MG tablet Take 2 mg by mouth daily.    Marland Kitchen  HYDROcodone-acetaminophen (NORCO) 10-325 MG tablet Take 1 tablet by mouth 3 (three) times daily.     Marland Kitchen HYSINGLA ER 60 MG T24A Take 1 tablet by mouth daily.    Marland Kitchen lenalidomide (REVLIMID) 15 MG capsule Take 1 capsule by mouth daily for 21 days then 7 days off, repeat every 28 days. 21 capsule 0  . losartan (COZAAR) 50 MG tablet Take 50 mg by mouth every morning.     . metFORMIN (GLUCOPHAGE-XR) 500 MG 24 hr tablet Take 1,000 mg by mouth 2 (two) times daily.     Marland Kitchen omeprazole (PRILOSEC) 20 MG capsule Take 20 mg by mouth 2 (two) times daily before a meal.     . Tamsulosin HCl (FLOMAX) 0.4 MG CAPS Take 0.4 mg by mouth 2 (two) times daily.     . traMADol (ULTRAM) 50 MG tablet Take 50 mg by mouth 4 (four) times daily -  with meals and at bedtime. For pain     No current facility-administered medications for this visit.    Allergies:  Allergies  Allergen Reactions  . Invokana [Canagliflozin] Anaphylaxis    Facial and lip swelling      Physical Exam:   Blood pressure (!) 153/74, pulse 100, temperature 98.9 F (37.2 C), temperature source Temporal, resp. rate 17, height 6' 1" (1.854 m), weight 189 lb 9.6 oz (86  kg), SpO2 98 %.         ECOG: 1    General appearance: Alert, awake without any distress. Head: Atraumatic without abnormalities Oropharynx: Without any thrush or ulcers. Eyes: No scleral icterus. Lymph nodes: No lymphadenopathy noted in the cervical, supraclavicular, or axillary nodes Heart:regular rate and rhythm, without any murmurs or gallops.   Lung: Clear to auscultation without any rhonchi, wheezes or dullness to percussion. Abdomin: Soft, nontender without any shifting dullness or ascites. Musculoskeletal: No clubbing or cyanosis. Neurological: No motor or sensory deficits. Skin: No rashes or lesions.    .      Lab Results: Lab Results  Component Value Date   WBC 9.2 11/06/2019   HGB 12.1 (L) 11/06/2019   HCT 36.0 (L) 11/06/2019   MCV 90.5 11/06/2019    PLT 263 11/06/2019     Chemistry      Component Value Date/Time   NA 140 11/06/2019 1403   NA 138 06/26/2017 0946   K 3.1 (L) 11/06/2019 1403   K 3.8 06/26/2017 0946   CL 103 11/06/2019 1403   CL 105 08/09/2012 0918   CO2 24 11/06/2019 1403   CO2 25 06/26/2017 0946   BUN 14 11/06/2019 1403   BUN 16.4 06/26/2017 0946   CREATININE 1.15 11/06/2019 1403   CREATININE 1.2 06/26/2017 0946      Component Value Date/Time   CALCIUM 9.1 11/06/2019 1403   CALCIUM 9.3 06/26/2017 0946   ALKPHOS 94 11/06/2019 1403   ALKPHOS 75 06/26/2017 0946   AST 8 (L) 11/06/2019 1403   AST 11 06/26/2017 0946   ALT 14 11/06/2019 1403   ALT 14 06/26/2017 0946   BILITOT 0.3 11/06/2019 1403   BILITOT 0.31 06/26/2017 0946       Results for James [name] (MRN [id number]) as of 11/27/2019 15:37  Ref. Range 09/18/2019 13:04 11/06/2019 14:03  IFE 1 Unknown Comment (A) Comment (A)  Globulin, Total Latest Ref Range: 2.2 - 3.9 g/dL 2.8 2.6  B-Globulin SerPl Elph-Mcnc Latest Ref Range: 0.7 - 1.3 g/dL 1.2 1.1  IgG (Immunoglobin G), Serum Latest Ref Range: 603 - 1,613 mg/dL 605 494 (L)  IgM (Immunoglobulin M), Srm Latest Ref Range: 15 - 143 mg/dL 12 (L) 13 (L)  IgA Latest Ref Range: 61 - 437 mg/dL 442 (H) 338        Impression and Plan:  74 year old with:  1.  IgA kappa multiple myeloma presented with anemia and bone marrow involvement diagnosed in 2017.     He continues to receive induction with Revlimid and dexamethasone with excellent response by laboratory criteria. Laboratory data obtained on April 8 of 2021 showed a near complete response at this time with normalization of his M spike and immunoglobulins including IgG 8. Risks and benefits of continuing this treatment versus switching to maintenance approach was reviewed.  After discussion today, the plan is to proceed with Revlimid maintenance at 5 mg daily after the completion of this current cycle.     2.  Chronic back and hip pain:  Unchanged at this time and unrelated to his malignancy.  3.  Anemia: Hemoglobin remains stable at this time.  His anemia is related to plasma cell disorder  4.  Diarrhea: He continues to follow with GI regarding this issue.  Reducing the dose of Revlimid might help long-term.   5.  Follow-up: In 6 weeks for a follow-up visit.  30  minutes were spent on this encounter.  The time was  dedicated to updating his disease status, treatment options and addressing complications noted to therapy.   Zola Button, [name] 4/29/20214:07 PM

## 2019-11-28 ENCOUNTER — Telehealth: Payer: Self-pay

## 2019-11-28 ENCOUNTER — Other Ambulatory Visit: Payer: Self-pay

## 2019-11-28 DIAGNOSIS — E118 Type 2 diabetes mellitus with unspecified complications: Secondary | ICD-10-CM | POA: Diagnosis not present

## 2019-11-28 DIAGNOSIS — I10 Essential (primary) hypertension: Secondary | ICD-10-CM | POA: Diagnosis not present

## 2019-11-28 DIAGNOSIS — J449 Chronic obstructive pulmonary disease, unspecified: Secondary | ICD-10-CM | POA: Diagnosis not present

## 2019-11-28 DIAGNOSIS — E785 Hyperlipidemia, unspecified: Secondary | ICD-10-CM | POA: Diagnosis not present

## 2019-11-28 DIAGNOSIS — B370 Candidal stomatitis: Secondary | ICD-10-CM | POA: Diagnosis not present

## 2019-11-28 MED ORDER — LENALIDOMIDE 5 MG PO CAPS: 5.0000 mg | ORAL_CAPSULE | Freq: Every day | ORAL | 0 refills | Status: DC

## 2019-11-28 NOTE — Telephone Encounter (Signed)
New Revlimid 5 mg prescription sent to [facility]. Auth # [id number] 11/28/2019.

## 2019-11-28 NOTE — Telephone Encounter (Signed)
-----   Message from Teodoro Spray, RN sent at 11/28/2019  7:26 AM EDT ----- Regarding: New Prescription James [name],   Dr. Alen Blew sent me this via [place] yesterday evening around 4:25pm.  Would you be able to update the patient's Revlimid prescription today?   "We need change his Revlimid to 5 mg daily for 21 days.  You can now work on this tomorrow or pass it on to whoever is covering the desk. Thanks"  Thank you,  [employer]

## 2019-12-01 ENCOUNTER — Telehealth: Payer: Self-pay

## 2019-12-01 NOTE — Telephone Encounter (Signed)
Received TC from [facility]  wanting to verify a dose change in the patient Revlamid prescription. They stated they have refilled 15mg  but just got a request on April 30th for revlamid 5mg . is this correct? Per Dr Alen Blew It is correct. He is to finish the 15 mg cycle and will start the 5 mg starting with the next cycle after he finishes his 15 mg supply. Called back [place] @ [phone] to let them know that dose change is correct per Dr Alen Blew

## 2019-12-08 DIAGNOSIS — E118 Type 2 diabetes mellitus with unspecified complications: Secondary | ICD-10-CM | POA: Diagnosis not present

## 2019-12-15 DIAGNOSIS — I10 Essential (primary) hypertension: Secondary | ICD-10-CM | POA: Diagnosis not present

## 2019-12-15 DIAGNOSIS — R0602 Shortness of breath: Secondary | ICD-10-CM | POA: Diagnosis not present

## 2019-12-15 DIAGNOSIS — E1165 Type 2 diabetes mellitus with hyperglycemia: Secondary | ICD-10-CM | POA: Diagnosis not present

## 2019-12-17 ENCOUNTER — Other Ambulatory Visit: Payer: Self-pay | Admitting: Oncology

## 2019-12-17 ENCOUNTER — Other Ambulatory Visit: Payer: Self-pay | Admitting: Gastroenterology

## 2019-12-17 DIAGNOSIS — K625 Hemorrhage of anus and rectum: Secondary | ICD-10-CM | POA: Diagnosis not present

## 2019-12-17 DIAGNOSIS — C9001 Multiple myeloma in remission: Secondary | ICD-10-CM

## 2019-12-17 DIAGNOSIS — R0602 Shortness of breath: Secondary | ICD-10-CM | POA: Diagnosis not present

## 2019-12-22 DIAGNOSIS — E1165 Type 2 diabetes mellitus with hyperglycemia: Secondary | ICD-10-CM | POA: Diagnosis not present

## 2019-12-22 DIAGNOSIS — J449 Chronic obstructive pulmonary disease, unspecified: Secondary | ICD-10-CM | POA: Diagnosis not present

## 2019-12-22 DIAGNOSIS — I10 Essential (primary) hypertension: Secondary | ICD-10-CM | POA: Diagnosis not present

## 2019-12-22 DIAGNOSIS — E785 Hyperlipidemia, unspecified: Secondary | ICD-10-CM | POA: Diagnosis not present

## 2019-12-24 DIAGNOSIS — M48061 Spinal stenosis, lumbar region without neurogenic claudication: Secondary | ICD-10-CM | POA: Diagnosis not present

## 2019-12-24 DIAGNOSIS — Z79899 Other long term (current) drug therapy: Secondary | ICD-10-CM | POA: Diagnosis not present

## 2019-12-24 DIAGNOSIS — C9002 Multiple myeloma in relapse: Secondary | ICD-10-CM | POA: Diagnosis not present

## 2019-12-24 DIAGNOSIS — G8929 Other chronic pain: Secondary | ICD-10-CM | POA: Diagnosis not present

## 2019-12-25 DIAGNOSIS — J449 Chronic obstructive pulmonary disease, unspecified: Secondary | ICD-10-CM | POA: Diagnosis not present

## 2019-12-25 DIAGNOSIS — J441 Chronic obstructive pulmonary disease with (acute) exacerbation: Secondary | ICD-10-CM | POA: Diagnosis not present

## 2019-12-30 ENCOUNTER — Other Ambulatory Visit (HOSPITAL_COMMUNITY)
Admission: RE | Admit: 2019-12-30 | Discharge: 2019-12-30 | Disposition: A | Discharge status: Home / Self Care | Payer: Medicare HMO | Source: Ambulatory Visit | Attending: Gastroenterology | Admitting: Gastroenterology

## 2020-01-01 ENCOUNTER — Other Ambulatory Visit (HOSPITAL_COMMUNITY)
Admission: RE | Admit: 2020-01-01 | Discharge: 2020-01-01 | Disposition: A | Discharge status: Home / Self Care | Payer: Medicare HMO | Source: Ambulatory Visit | Attending: Gastroenterology | Admitting: Gastroenterology

## 2020-01-01 ENCOUNTER — Other Ambulatory Visit: Payer: Self-pay | Admitting: *Deleted

## 2020-01-01 DIAGNOSIS — C9001 Multiple myeloma in remission: Secondary | ICD-10-CM

## 2020-01-01 DIAGNOSIS — Z20822 Contact with and (suspected) exposure to covid-19: Secondary | ICD-10-CM | POA: Insufficient documentation

## 2020-01-01 DIAGNOSIS — Z01812 Encounter for preprocedural laboratory examination: Secondary | ICD-10-CM | POA: Insufficient documentation

## 2020-01-01 LAB — SARS CORONAVIRUS 2 (TAT 6-24 HRS): SARS Coronavirus 2: NEGATIVE

## 2020-01-01 NOTE — Progress Notes (Signed)
Received request from Dr. Delman Kitten with [facility]) Neurology.  They are requesting that we locally completed a urine drug screen which is a part of patients yearly pain management visit so that he doesn't have to drive all the way to [facility] to have lab test done.   Dr. Alen Blew agreed to order on behalf.    Need to fax results to [phone] as unable to cc results via [facility] to this specific provider.  Phone number for office is [phone]

## 2020-01-02 ENCOUNTER — Ambulatory Visit (HOSPITAL_COMMUNITY): Payer: Medicare HMO | Admitting: Certified Registered"

## 2020-01-02 ENCOUNTER — Ambulatory Visit (HOSPITAL_COMMUNITY)
Admission: RE | Admit: 2020-01-02 | Discharge: 2020-01-02 | Disposition: A | Discharge status: Home / Self Care | Payer: Medicare HMO | Attending: Gastroenterology | Admitting: Gastroenterology

## 2020-01-02 ENCOUNTER — Other Ambulatory Visit: Payer: Self-pay

## 2020-01-02 ENCOUNTER — Encounter (HOSPITAL_COMMUNITY)
Admission: RE | Disposition: A | Discharge status: Home / Self Care | Payer: Self-pay | Source: Home / Self Care | Attending: Gastroenterology

## 2020-01-02 DIAGNOSIS — E119 Type 2 diabetes mellitus without complications: Secondary | ICD-10-CM | POA: Insufficient documentation

## 2020-01-02 DIAGNOSIS — I10 Essential (primary) hypertension: Secondary | ICD-10-CM | POA: Diagnosis not present

## 2020-01-02 DIAGNOSIS — I2510 Atherosclerotic heart disease of native coronary artery without angina pectoris: Secondary | ICD-10-CM | POA: Diagnosis not present

## 2020-01-02 DIAGNOSIS — K635 Polyp of colon: Secondary | ICD-10-CM | POA: Insufficient documentation

## 2020-01-02 DIAGNOSIS — K621 Rectal polyp: Secondary | ICD-10-CM | POA: Insufficient documentation

## 2020-01-02 DIAGNOSIS — D120 Benign neoplasm of cecum: Secondary | ICD-10-CM | POA: Diagnosis not present

## 2020-01-02 DIAGNOSIS — R197 Diarrhea, unspecified: Secondary | ICD-10-CM | POA: Insufficient documentation

## 2020-01-02 DIAGNOSIS — K625 Hemorrhage of anus and rectum: Secondary | ICD-10-CM | POA: Diagnosis not present

## 2020-01-02 DIAGNOSIS — Z87891 Personal history of nicotine dependence: Secondary | ICD-10-CM | POA: Insufficient documentation

## 2020-01-02 DIAGNOSIS — K921 Melena: Secondary | ICD-10-CM | POA: Diagnosis not present

## 2020-01-02 DIAGNOSIS — Z888 Allergy status to other drugs, medicaments and biological substances status: Secondary | ICD-10-CM | POA: Insufficient documentation

## 2020-01-02 HISTORY — PX: BIOPSY: SHX5522

## 2020-01-02 HISTORY — PX: COLONOSCOPY WITH PROPOFOL: SHX5780

## 2020-01-02 HISTORY — PX: POLYPECTOMY: SHX5525

## 2020-01-02 LAB — GLUCOSE, CAPILLARY: Glucose-Capillary: 209 mg/dL — ABNORMAL HIGH (ref 70–99)

## 2020-01-02 SURGERY — COLONOSCOPY WITH PROPOFOL
Anesthesia: Monitor Anesthesia Care

## 2020-01-02 MED ORDER — PROPOFOL 10 MG/ML IV BOLUS
INTRAVENOUS | Status: DC | PRN
  Administered 2020-01-02: 20 mg via INTRAVENOUS

## 2020-01-02 MED ORDER — LACTATED RINGERS IV SOLN: INTRAVENOUS | Status: DC

## 2020-01-02 MED ORDER — LACTATED RINGERS IV SOLN
INTRAVENOUS | Status: AC | PRN
  Administered 2020-01-02: 1000 mL via INTRAVENOUS

## 2020-01-02 MED ORDER — SODIUM CHLORIDE 0.9 % IV SOLN: INTRAVENOUS | Status: DC

## 2020-01-02 MED ORDER — PROPOFOL 500 MG/50ML IV EMUL
INTRAVENOUS | Status: AC
  Filled 2020-01-02: qty 50

## 2020-01-02 MED ORDER — PROPOFOL 10 MG/ML IV BOLUS
INTRAVENOUS | Status: AC
  Filled 2020-01-02: qty 20

## 2020-01-02 MED ORDER — PROPOFOL 500 MG/50ML IV EMUL
INTRAVENOUS | Status: DC | PRN
  Administered 2020-01-02: 125 ug/kg/min via INTRAVENOUS

## 2020-01-02 SURGICAL SUPPLY — 22 items

## 2020-01-02 NOTE — H&P (Signed)
  James [name]. HPI: He was previously evaluated in the office for complaints of hematochezia and the plan was to perform a colonoscopy, however, he had severe labored breathing. Dr. Ashby Dawes worked him up for the SOB. The patient states that he is now taking a nebulizer TID. He does not feel that it is of any benefit. The patient denies any issues with hematochezia over this time period    Past Medical History:  Diagnosis Date  . Anemia   . Arthritis   . BPH (benign prostatic hyperplasia)   . Chronic pain   . Diabetes mellitus   . GERD (gastroesophageal reflux disease)   . Headache(784.0)    migraines, sinus headaches  . Hepatitis    C and B-treated  . HLD (hyperlipidemia)   . Hypertension   . Pneumonia 07/2011  . Sleep apnea 03/2018   going for fitting on 04-29-18  . Thyroid disease    goiter  . Urinary frequency     Past Surgical History:  Procedure Laterality Date  . ABDOMINAL SURGERY    . BACK SURGERY     x 5   . CHOLECYSTECTOMY    . COLONOSCOPY    . cyst removal skull  20 years ago  . ETHMOIDECTOMY  10/12/2011   Procedure: ETHMOIDECTOMY;  Surgeon: Thornell Sartorius, MD;  Location[facility] OR;  Service: ENT;  Laterality: Bilateral;  bilateral maxillary sinus osteal enlargement, frontal sinusotomy  . EYE SURGERY     bilat cataract with lens implants  . HAND SURGERY     right finger  . HERNIA REPAIR    . ROTATOR CUFF REPAIR     bilateral  . SHOULDER ARTHROSCOPY WITH ROTATOR CUFF REPAIR Left 05/18/2014   Procedure: SHOULDER ARTHROSCOPY WITH ROTATOR CUFF REPAIR;  Surgeon: Nita Sells, MD;  Location: [facility];  Service: Orthopedics;  Laterality: Left;  Left shoulder arthroscopy rotator cuff repair, subacromial decompression  . sinus surgery    . TONSILLECTOMY    . TRIGGER FINGER RELEASE Right 05/02/2018   Procedure: RELEASE TRIGGER FINGER/A-1 PULLEY RIGHT SMALL FINGER;  Surgeon: Leanora Cover, MD;  Location: [facility];   Service: Orthopedics;  Laterality: Right;    Family History  Problem Relation Age of Onset  . Hyperlipidemia Mother   . Hypertension Mother     Social History:  reports that he quit smoking about 35 years ago. He has never used smokeless tobacco. He reports that he does not drink alcohol or use drugs.  Allergies:  Allergies  Allergen Reactions  . Invokana [Canagliflozin] Anaphylaxis    Facial and lip swelling    Medications: Scheduled: Continuous:  Results for orders placed or performed during the hospital encounter of 01/01/20 (from the past 24 hour(s))  SARS CORONAVIRUS 2 (TAT 6-24 HRS) Nasopharyngeal Nasopharyngeal Swab     Status: None   Collection Time: 01/01/20  3:17 PM   Specimen: Nasopharyngeal Swab  Result Value Ref Range   SARS Coronavirus 2 NEGATIVE NEGATIVE     No results found.  ROS:  As stated above in the HPI otherwise negative.  There were no vitals taken for this visit.    PE: Gen: NAD, Alert and Oriented HEENT:  [place]/AT, EOMI Neck: Supple, no LAD Lungs: CTA Bilaterally CV: RRR without M/G/R ABD: Soft, NTND, +BS Ext: No C/C/E  Assessment/Plan: 1) Hematochezia - colonoscopy.  Aleene Swanner D 01/02/2020, 9:40 AM

## 2020-01-02 NOTE — Transfer of Care (Signed)
Immediate Anesthesia Transfer of Care Note  Patient: James [name].  Procedure(s) Performed: COLONOSCOPY WITH PROPOFOL (N/A ) POLYPECTOMY BIOPSY  Patient Location: PACU  Anesthesia Type:MAC  Level of Consciousness: awake, alert  and patient cooperative  Airway & Oxygen Therapy: Patient Spontanous Breathing and Patient connected to face mask oxygen  Post-op Assessment: Report given to RN and Post -op Vital signs reviewed and stable  Post vital signs: Reviewed and stable  Last Vitals:  Vitals Value Taken Time  BP    Temp    Pulse    Resp    SpO2      Last Pain:  Vitals:   01/02/20 1006  TempSrc: Oral  PainSc: 8          Complications: No apparent anesthesia complications

## 2020-01-02 NOTE — Anesthesia Procedure Notes (Signed)
Procedure Name: MAC Date/Time: 01/02/2020 10:52 AM Performed by: Eben Burow, CRNA Pre-anesthesia Checklist: Patient identified, Emergency Drugs available, Suction available, Patient being monitored and Timeout performed Patient Re-evaluated:Patient Re-evaluated prior to induction Oxygen Delivery Method: Simple face mask Preoxygenation: Pre-oxygenation with 100% oxygen Placement Confirmation: positive ETCO2 Dental Injury: Teeth and Oropharynx as per pre-operative assessment

## 2020-01-02 NOTE — Discharge Instructions (Signed)

## 2020-01-02 NOTE — Anesthesia Postprocedure Evaluation (Signed)
Anesthesia Post Note  Patient: James [name].  Procedure(s) Performed: COLONOSCOPY WITH PROPOFOL (N/A ) POLYPECTOMY BIOPSY     Patient location during evaluation: PACU Anesthesia Type: MAC Level of consciousness: awake and alert Pain management: pain level controlled Vital Signs Assessment: post-procedure vital signs reviewed and stable Respiratory status: spontaneous breathing, nonlabored ventilation, respiratory function stable and patient connected to nasal cannula oxygen Cardiovascular status: stable and blood pressure returned to baseline Postop Assessment: no apparent nausea or vomiting Anesthetic complications: no    Last Vitals:  Vitals:   01/02/20 1150 01/02/20 1200  BP: 119/76 (!) 145/93  Pulse: 66 69  Resp: 15 20  Temp:    SpO2: 98% 100%    Last Pain:  Vitals:   01/02/20 1200  TempSrc:   PainSc: 0-No pain                 Effie Berkshire

## 2020-01-02 NOTE — Anesthesia Preprocedure Evaluation (Signed)
Anesthesia Evaluation  Patient identified by MRN, date of birth, ID band Patient awake    Reviewed: Allergy & Precautions, NPO status , Patient's Chart, lab work & pertinent test results  Airway Mallampati: I  TM Distance: >3 FB Neck ROM: Full    Dental  (+) Partial Lower, Partial Upper, Missing, Dental Advisory Given   Pulmonary sleep apnea ,    breath sounds clear to auscultation       Cardiovascular hypertension, Pt. on medications and Pt. on home beta blockers + CAD  + dysrhythmias  Rhythm:Regular Rate:Normal     Neuro/Psych  Headaches, negative psych ROS   GI/Hepatic GERD  Medicated,(+) Hepatitis -, C  Endo/Other  diabetes, Type 2, Oral Hypoglycemic Agents  Renal/GU negative Renal ROS     Musculoskeletal  (+) Arthritis ,   Abdominal Normal abdominal exam  (+)   Peds  Hematology negative hematology ROS (+)   Anesthesia Other Findings   Reproductive/Obstetrics                             Anesthesia Physical Anesthesia Plan  ASA: II  Anesthesia Plan: MAC   Post-op Pain Management:    Induction: Intravenous  PONV Risk Score and Plan: 0 and Propofol infusion  Airway Management Planned: Natural Airway and Simple Face Mask  Additional Equipment: None  Intra-op Plan:   Post-operative Plan:   Informed Consent: I have reviewed the patients History and Physical, chart, labs and discussed the procedure including the risks, benefits and alternatives for the proposed anesthesia with the patient or authorized representative who has indicated his/her understanding and acceptance.       Plan Discussed with: CRNA  Anesthesia Plan Comments:         Anesthesia Quick Evaluation

## 2020-01-02 NOTE — Op Note (Signed)
[facility] Patient Name: James [name] Procedure Date: 01/02/2020 MRN: [id number] Attending MD: Carol Ada , MD Date of Birth: 07/16/1946 CSN: [id number] Age: 74 Admit Type: Outpatient Procedure:                Colonoscopy Indications:              Hematochezia, Diarrhea Providers:                Carol Ada, MD, Cleda Daub, RN, Laverda Sorenson,                            Technician, Christell Faith, CRNA Referring MD:              Medicines:                Propofol per Anesthesia Complications:            No immediate complications. Estimated Blood Loss:     Estimated blood loss was minimal. Procedure:                Pre-Anesthesia Assessment:                           - Prior to the procedure, a History and Physical                            was performed, and patient medications and                            allergies were reviewed. The patient's tolerance of                            previous anesthesia was also reviewed. The risks                            and benefits of the procedure and the sedation                            options and risks were discussed with the patient.                            All questions were answered, and informed consent                            was obtained. Prior Anticoagulants: The patient has                            taken no previous anticoagulant or antiplatelet                            agents. ASA Grade Assessment: III - A patient with                            severe systemic disease. After reviewing the risks  and benefits, the patient was deemed in                            satisfactory condition to undergo the procedure.                           - Sedation was administered by an anesthesia                            professional. Deep sedation was attained.                           After obtaining informed consent, the colonoscope                            was passed under direct  vision. Throughout the                            procedure, the patient's blood pressure, pulse, and                            oxygen saturations were monitored continuously. The                            CF-HQ190L ([id number]) Olympus colonoscope was                            introduced through the anus and advanced to the the                            cecum, identified by appendiceal orifice and                            ileocecal valve. The colonoscopy was performed                            without difficulty. The patient tolerated the                            procedure well. The quality of the bowel                            preparation was excellent. The ileocecal valve,                            appendiceal orifice, and rectum were photographed. Scope In: 10:58:22 AM Scope Out: 11:23:22 AM Scope Withdrawal Time: 0 hours 18 minutes 56 seconds  Total Procedure Duration: 0 hours 25 minutes 0 seconds  Findings:      Six sessile polyps were found in the rectum, sigmoid colon, ascending       colon and cecum. The polyps were 3 to 10 mm in size. These polyps were       removed with a cold snare. Resection and retrieval were complete.      A 8 mm polyp was found in  the hepatic flexure. The polyp was       pedunculated. The polyp was removed with a hot snare. Resection and       retrieval were complete.      Normal mucosa was found in the entire colon. Biopsies for histology were       taken with a cold forceps from the entire colon for evaluation of       microscopic colitis. Impression:               - Six 3 to 10 mm polyps in the rectum, in the                            sigmoid colon, in the ascending colon and in the                            cecum, removed with a cold snare. Resected and                            retrieved.                           - One 8 mm polyp at the hepatic flexure, removed                            with a hot snare. Resected and retrieved.                            - Normal mucosa in the entire examined colon.                            Biopsied. Moderate Sedation:      Not Applicable - Patient had care per Anesthesia. Recommendation:           - Patient has a contact number available for                            emergencies. The signs and symptoms of potential                            delayed complications were discussed with the                            patient. Return to normal activities tomorrow.                            Written discharge instructions were provided to the                            patient.                           - Resume previous diet.                           - Continue present medications.                           -  Await pathology results.                           - Repeat colonoscopy in 3 years for surveillance.                           - Return to GI clinic in 4 weeks. Procedure Code(s):        --- Professional ---                           [id number], Colonoscopy, flexible; with removal of                            tumor(s), polyp(s), or other lesion(s) by snare                            technique                           45380, 102, Colonoscopy, flexible; with biopsy,                            single or multiple Diagnosis Code(s):        --- Professional ---                           K62.1, Rectal polyp                           K63.5, Polyp of colon                           K92.1, Melena (includes Hematochezia)                           R19.7, Diarrhea, unspecified CPT copyright 2019 [employer]. All rights reserved. The codes documented in this report are preliminary and upon coder review may  be revised to meet current compliance requirements. Carol Ada, MD Carol Ada, MD 01/02/2020 11:30:38 AM This report has been signed electronically. Number of Addenda: 0

## 2020-01-04 ENCOUNTER — Other Ambulatory Visit: Payer: Self-pay | Admitting: Oncology

## 2020-01-04 DIAGNOSIS — C9001 Multiple myeloma in remission: Secondary | ICD-10-CM

## 2020-01-05 LAB — SURGICAL PATHOLOGY

## 2020-01-08 ENCOUNTER — Other Ambulatory Visit: Payer: Self-pay

## 2020-01-08 ENCOUNTER — Telehealth: Payer: Self-pay

## 2020-01-08 ENCOUNTER — Inpatient Hospital Stay: Payer: Medicare HMO | Attending: Oncology

## 2020-01-08 DIAGNOSIS — Z79899 Other long term (current) drug therapy: Secondary | ICD-10-CM | POA: Insufficient documentation

## 2020-01-08 DIAGNOSIS — D649 Anemia, unspecified: Secondary | ICD-10-CM | POA: Insufficient documentation

## 2020-01-08 DIAGNOSIS — C9001 Multiple myeloma in remission: Secondary | ICD-10-CM

## 2020-01-08 DIAGNOSIS — C9002 Multiple myeloma in relapse: Secondary | ICD-10-CM | POA: Insufficient documentation

## 2020-01-08 LAB — CMP (CANCER CENTER ONLY)
ALT: 13 U/L (ref 0–44)
AST: 13 U/L — ABNORMAL LOW (ref 15–41)
Albumin: 3.6 g/dL (ref 3.5–5.0)
Alkaline Phosphatase: 104 U/L (ref 38–126)
Anion gap: 9 (ref 5–15)
BUN: 8 mg/dL (ref 8–23)
CO2: 30 mmol/L (ref 22–32)
Calcium: 8.6 mg/dL — ABNORMAL LOW (ref 8.9–10.3)
Chloride: 101 mmol/L (ref 98–111)
Creatinine: 1.15 mg/dL (ref 0.61–1.24)
GFR, Est AFR Am: >60 mL/min (ref >60)
GFR, Estimated: >60 mL/min (ref >60)
Glucose, Bld: 204 mg/dL — ABNORMAL HIGH (ref 70–99)
Potassium: 2.8 mmol/L — CL (ref 3.5–5.1)
Sodium: 140 mmol/L (ref 135–145)
Total Bilirubin: 0.5 mg/dL (ref 0.3–1.2)
Total Protein: 6.5 g/dL (ref 6.5–8.1)

## 2020-01-08 LAB — CBC WITH DIFFERENTIAL (CANCER CENTER ONLY)
Abs Immature Granulocytes: 0.03 10*3/uL (ref 0.00–0.07)
Basophils Absolute: 0.1 10*3/uL (ref 0.0–0.1)
Basophils Relative: 1 %
Eosinophils Absolute: 0.3 10*3/uL (ref 0.0–0.5)
Eosinophils Relative: 4 %
HCT: 36.4 % — ABNORMAL LOW (ref 39.0–52.0)
Hemoglobin: 12.4 g/dL — ABNORMAL LOW (ref 13.0–17.0)
Immature Granulocytes: 0 %
Lymphocytes Relative: 20 %
Lymphs Abs: 1.6 10*3/uL (ref 0.7–4.0)
MCH: 31.2 pg (ref 26.0–34.0)
MCHC: 34.1 g/dL (ref 30.0–36.0)
MCV: 91.7 fL (ref 80.0–100.0)
Monocytes Absolute: 0.6 10*3/uL (ref 0.1–1.0)
Monocytes Relative: 8 %
Neutro Abs: 5.2 10*3/uL (ref 1.7–7.7)
Neutrophils Relative %: 67 %
Platelet Count: 233 10*3/uL (ref 150–400)
RBC: 3.97 MIL/uL — ABNORMAL LOW (ref 4.22–5.81)
RDW: 14.4 % (ref 11.5–15.5)
WBC Count: 7.8 10*3/uL (ref 4.0–10.5)
nRBC: 0 % (ref 0.0–0.2)

## 2020-01-08 MED ORDER — POTASSIUM CHLORIDE CRYS ER 20 MEQ PO TBCR
20.0000 meq | EXTENDED_RELEASE_TABLET | Freq: Every day | ORAL | 0 refills | Status: DC
Start: 2020-01-08 — End: 2020-03-04

## 2020-01-08 MED ORDER — LENALIDOMIDE 5 MG PO CAPS: 5.0000 mg | ORAL_CAPSULE | Freq: Every day | ORAL | 0 refills | Status: DC

## 2020-01-08 NOTE — Telephone Encounter (Signed)
Received a call from lab technician at 1239 reporting patient's potassium level is 2.8.  Dr. Alen Blew made aware at 1241. Per Dr. Alen Blew patient made aware he needs to be on potassium supplement. K dur 20 meq daily for 30 days. Prescription sent to [facility] and patient verbalized understanding.

## 2020-01-09 LAB — KAPPA/LAMBDA LIGHT CHAINS
Kappa free light chain: 32.6 mg/L — ABNORMAL HIGH (ref 3.3–19.4)
Kappa, lambda light chain ratio: 1.73 — ABNORMAL HIGH (ref 0.26–1.65)
Lambda free light chains: 18.8 mg/L (ref 5.7–26.3)

## 2020-01-12 LAB — MULTIPLE MYELOMA PANEL, SERUM
Albumin SerPl Elph-Mcnc: 3.5 g/dL (ref 2.9–4.4)
Albumin/Glob SerPl: 1.4 (ref 0.7–1.7)
Alpha 1: 0.2 g/dL (ref 0.0–0.4)
Alpha2 Glob SerPl Elph-Mcnc: 0.8 g/dL (ref 0.4–1.0)
B-Globulin SerPl Elph-Mcnc: 1.1 g/dL (ref 0.7–1.3)
Gamma Glob SerPl Elph-Mcnc: 0.5 g/dL (ref 0.4–1.8)
Globulin, Total: 2.6 g/dL (ref 2.2–3.9)
IgA: 332 mg/dL (ref 61–437)
IgG (Immunoglobin G), Serum: 560 mg/dL — ABNORMAL LOW (ref 603–1613)
IgM (Immunoglobulin M), Srm: 11 mg/dL — ABNORMAL LOW (ref 15–143)
M Protein SerPl Elph-Mcnc: 0.3 g/dL — ABNORMAL HIGH
Total Protein ELP: 6.1 g/dL (ref 6.0–8.5)

## 2020-01-14 LAB — URINE DRUG PANEL 7
Amphetamines, Urine: NEGATIVE ng/mL
Barbiturate, Ur: NEGATIVE ng/mL
Benzodiazepine Quant, Ur: NEGATIVE ng/mL
Cannabinoid Quant, Ur: NEGATIVE ng/mL
Cocaine (Metab.): NEGATIVE ng/mL
Opiate Quant, Ur: NEGATIVE ng/mL
Phencyclidine, Ur: NEGATIVE ng/mL

## 2020-01-15 ENCOUNTER — Telehealth: Payer: Self-pay | Admitting: *Deleted

## 2020-01-15 NOTE — Telephone Encounter (Signed)
Patient completed urine drug panel per outside request from Dr Ermalene Postin at [facility] neurolog[facility].  Faxed results to [phone].

## 2020-01-16 ENCOUNTER — Inpatient Hospital Stay (HOSPITAL_BASED_OUTPATIENT_CLINIC_OR_DEPARTMENT_OTHER): Payer: Medicare HMO | Admitting: Oncology

## 2020-01-16 ENCOUNTER — Other Ambulatory Visit: Payer: Self-pay

## 2020-01-16 ENCOUNTER — Telehealth: Payer: Self-pay

## 2020-01-16 DIAGNOSIS — C9001 Multiple myeloma in remission: Secondary | ICD-10-CM

## 2020-01-16 DIAGNOSIS — D649 Anemia, unspecified: Secondary | ICD-10-CM | POA: Diagnosis not present

## 2020-01-16 DIAGNOSIS — C9002 Multiple myeloma in relapse: Secondary | ICD-10-CM | POA: Diagnosis not present

## 2020-01-16 DIAGNOSIS — Z79899 Other long term (current) drug therapy: Secondary | ICD-10-CM | POA: Diagnosis not present

## 2020-01-16 NOTE — Progress Notes (Signed)
Hematology and Oncology Follow Up Visit  James [name] [id number] 1946-04-13 73 y.o. 01/16/2020 3:51 PM James John, MD   Principle Diagnosis: 74 year old man with multiple myeloma diagnosed in June 2017. He was found to have IgA kappa,  anemia and bone marrow involvement close to 30%.   Prior therapy:  Revlimid and dexamethasone started on January 28 2016. Revlimid at 25 mg daily for 21 days with dexamethasone at 20 mg weekly. Therapy discontinued in January 2018 after achieving a complete response.  He developed relapsed disease in April 2020 with M spike of 3.9 g/dL IgM level of 4707.  Current therapy:   Revlimid 15 mg daily for 21 days out of a 28 day cycle with dexamethasone 40 mg weekly started in May 2020.  He achieved a complete response to therapy in May 2021.  Revlimid 5 mg daily 3 weeks on 1 week off started in May 2021.   Interim History:  James [name] is here for return evaluation.  Since the last visit, he has been switched to maintenance Revlimid without dexamethasone and noticed overall worsening of his symptoms.  He reports fatigue tiredness and increased in his bone pain.  He denies any recent hospitalization or illnesses.  He denies any hematochezia or melena.  He denies any recurrent infections.                 .    Medications: Unchanged on review.  Current Outpatient Medications  Medication Sig Dispense Refill  . albuterol (VENTOLIN HFA) 108 (90 Base) MCG/ACT inhaler Inhale 2 puffs into the lungs every 4 (four) hours as needed for shortness of breath.    Marland Kitchen amLODipine (NORVASC) 10 MG tablet Take 10 mg by mouth every morning.     Marland Kitchen aspirin-sod bicarb-citric acid (ALKA-SELTZER) 325 MG TBEF tablet Take 650 mg by mouth every 6 (six) hours as needed (indigestion / headaches).    . bismuth subsalicylate (PEPTO BISMOL) 262 MG/15ML suspension Take 30 mLs by mouth every 6 (six) hours as needed for indigestion or diarrhea or  loose stools.    . budesonide (PULMICORT) 0.5 MG/2ML nebulizer solution Take 0.5 mg by nebulization 2 (two) times daily.    Marland Kitchen dexamethasone (DECADRON) 4 MG tablet Take 5 tablets weekly. (Patient taking differently: Take 20 mg by mouth every Wednesday. ) 100 tablet 3  . diphenhydrAMINE HCl (ZZZQUIL) 50 MG/30ML LIQD Take 30 mLs by mouth at bedtime as needed (sleep).    . ferrous sulfate 325 (65 FE) MG tablet Take 325 mg by mouth daily.     . finasteride (PROSCAR) 5 MG tablet Take 5 mg by mouth daily.    . fluconazole (DIFLUCAN) 150 MG tablet Take 150 mg by mouth daily as needed (tickle in throat caused by yeast).    . gabapentin (NEURONTIN) 400 MG capsule Take 400 mg by mouth 3 (three) times daily.     Marland Kitchen glimepiride (AMARYL) 2 MG tablet Take 2 mg by mouth daily.    Marland Kitchen HYDROcodone-acetaminophen (NORCO) 10-325 MG tablet Take 1 tablet by mouth 4 (four) times daily.     Marland Kitchen HYSINGLA ER 60 MG T24A Take 60 mg by mouth daily.     Marland Kitchen ipratropium-albuterol (DUONEB) 0.5-2.5 (3) MG/3ML SOLN Take 3 mLs by nebulization 3 (three) times daily.    Marland Kitchen lenalidomide (REVLIMID) 5 MG capsule Take 1 capsule (5 mg total) by mouth daily. [employer] Auth # [id number]   Date Obtained 01/08/2020 21 capsule 0  . loperamide (IMODIUM  A-D) 2 MG tablet Take 4 mg by mouth 4 (four) times daily as needed for diarrhea or loose stools.    Marland Kitchen losartan (COZAAR) 50 MG tablet Take 50 mg by mouth every morning.     . metFORMIN (GLUCOPHAGE-XR) 500 MG 24 hr tablet Take 500-1,000 mg by mouth See admin instructions. Take 500 mg in the morning and 1000 mg in the evening    . omeprazole (PRILOSEC) 20 MG capsule Take 20 mg by mouth 2 (two) times daily before a meal.     . potassium chloride SA (KLOR-CON) 20 MEQ tablet Take 1 tablet (20 mEq total) by mouth daily. 30 tablet 0  . RESTASIS 0.05 % ophthalmic emulsion Place 1 drop into both eyes 2 (two) times daily as needed for dry eyes.    . Tamsulosin HCl (FLOMAX) 0.4 MG CAPS Take 0.4 mg by mouth 2 (two) times  daily.     Marland Kitchen tiZANidine (ZANAFLEX) 4 MG tablet Take 4 mg by mouth 3 (three) times daily.    . traMADol (ULTRAM) 50 MG tablet Take 50 mg by mouth 2 (two) times daily.      No current facility-administered medications for this visit.    Allergies:  Allergies  Allergen Reactions  . Invokana [Canagliflozin] Anaphylaxis    Facial and lip swelling      Physical Exam:     Blood pressure (!) 154/83, pulse 93, temperature 98.1 F (36.7 C), temperature source Temporal, resp. rate 18, height '6\' 1"'$  (1.854 m), weight 187 lb 4.8 oz (85 kg), SpO2 99 %.        ECOG: 1   General appearance: Comfortable appearing without any discomfort Head: Normocephalic without any trauma Oropharynx: Mucous membranes are moist and pink without any thrush or ulcers. Eyes: Pupils are equal and round reactive to light. Lymph nodes: No cervical, supraclavicular, inguinal or axillary lymphadenopathy.   Heart:regular rate and rhythm.  S1 and S2 without leg edema. Lung: Clear without any rhonchi or wheezes.  No dullness to percussion. Abdomin: Soft, nontender, nondistended with good bowel sounds.  No hepatosplenomegaly. Musculoskeletal: No joint deformity or effusion.  Full range of motion noted. Neurological: No deficits noted on motor, sensory and deep tendon reflex exam. Skin: No petechial rash or dryness.  Appeared moist.     .      Lab Results: Lab Results  Component Value Date   WBC 7.8 01/08/2020   HGB 12.4 (L) 01/08/2020   HCT 36.4 (L) 01/08/2020   MCV 91.7 01/08/2020   PLT 233 01/08/2020     Chemistry      Component Value Date/Time   NA 140 01/08/2020 1200   NA 138 06/26/2017 0946   K 2.8 (LL) 01/08/2020 1200   K 3.8 06/26/2017 0946   CL 101 01/08/2020 1200   CL 105 08/09/2012 0918   CO2 30 01/08/2020 1200   CO2 25 06/26/2017 0946   BUN 8 01/08/2020 1200   BUN 16.4 06/26/2017 0946   CREATININE 1.15 01/08/2020 1200   CREATININE 1.2 06/26/2017 0946      Component Value  Date/Time   CALCIUM 8.6 (L) 01/08/2020 1200   CALCIUM 9.3 06/26/2017 0946   ALKPHOS 104 01/08/2020 1200   ALKPHOS 75 06/26/2017 0946   AST 13 (L) 01/08/2020 1200   AST 11 06/26/2017 0946   ALT 13 01/08/2020 1200   ALT 14 06/26/2017 0946   BILITOT 0.5 01/08/2020 1200   BILITOT 0.31 06/26/2017 0946      Results for James [name],  James [name] (MRN [id number]) as of 01/16/2020 14:52  Ref. Range 01/08/2020 12:00  IgG (Immunoglobin G), Serum Latest Ref Range: 603 - 1,613 mg/dL 560 (L)  IgM (Immunoglobulin M), Srm Latest Ref Range: 15 - 143 mg/dL 11 (L)  IgA Latest Ref Range: 61 - 437 mg/dL 332  Kappa free light chain Latest Ref Range: 3.3 - 19.4 mg/L 32.6 (H)  Lamda free light chains Latest Ref Range: 5.7 - 26.3 mg/L 18.8  Kappa, lamda light chain ratio Latest Ref Range: 0.26 - 1.65  1.73 (H)          Impression and Plan:  74 year old with:  1.  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   He achieved a complete response on Revlimid and dexamethasone and was switched to Revlimid maintenance at 5 mg for the last cycle.  Protein studies obtained on 01/08/2020 were personally reviewed and showed slight increase in his M spike and clinical worsening disease.  After discussion today, we opted to return to induction dose of Revlimid at 15 mg with 20 mg of dexamethasone which she has tolerated well previously with improvement in his clinical status.     2.  Chronic back and hip pain: Manageable at this time with the current medication.  His pain is related to his multiple myeloma as well as arthritis and spinal stenosis.  3.  Anemia: Related to his multiple myeloma with hemoglobin close to normal range at this time.  4.  Diarrhea: He has follow-up with GI regarding this issue on colonoscopy in June 2021.   5.  Follow-up: He will return in 6 weeks for repeat evaluation.  30  minutes were dedicated to this visit. The time was spent on  reviewing his disease status, reviewing laboratory data, discussing treatment options and future plan of care discussion.   Zola Button, MD 6/18/20213:51 PM

## 2020-01-16 NOTE — Telephone Encounter (Signed)
Per Dr. Alen Blew Pt will have Dose increase to 15 mg of Revlimid starting in 3 weeks after this cycle. Pt. Currently on 5 mg for 21 days on 1 week off for a 28 day cycle.

## 2020-01-16 NOTE — Addendum Note (Signed)
Addended by: Lenox Ponds E on: 01/16/2020 05:03 PM   Modules accepted: Orders

## 2020-01-20 DIAGNOSIS — J449 Chronic obstructive pulmonary disease, unspecified: Secondary | ICD-10-CM | POA: Diagnosis not present

## 2020-01-20 DIAGNOSIS — J441 Chronic obstructive pulmonary disease with (acute) exacerbation: Secondary | ICD-10-CM | POA: Diagnosis not present

## 2020-01-23 ENCOUNTER — Telehealth: Payer: Self-pay | Admitting: Oncology

## 2020-01-23 NOTE — Telephone Encounter (Signed)
Scheduled per los, patient has been called and notified. Calender will be mailed for reminde.r  

## 2020-01-27 ENCOUNTER — Ambulatory Visit: Payer: Medicare HMO | Admitting: Oncology

## 2020-01-27 DIAGNOSIS — J441 Chronic obstructive pulmonary disease with (acute) exacerbation: Secondary | ICD-10-CM | POA: Diagnosis not present

## 2020-01-27 DIAGNOSIS — J449 Chronic obstructive pulmonary disease, unspecified: Secondary | ICD-10-CM | POA: Diagnosis not present

## 2020-01-31 ENCOUNTER — Other Ambulatory Visit: Payer: Self-pay | Admitting: Oncology

## 2020-01-31 DIAGNOSIS — C9001 Multiple myeloma in remission: Secondary | ICD-10-CM

## 2020-02-04 ENCOUNTER — Other Ambulatory Visit: Payer: Self-pay

## 2020-02-04 MED ORDER — LENALIDOMIDE 15 MG PO CAPS
15.0000 mg | ORAL_CAPSULE | Freq: Every day | ORAL | 0 refills | Status: DC
Start: 2020-02-04 — End: 2020-02-26

## 2020-02-11 DIAGNOSIS — R197 Diarrhea, unspecified: Secondary | ICD-10-CM | POA: Diagnosis not present

## 2020-02-17 DIAGNOSIS — Z6827 Body mass index (BMI) 27.0-27.9, adult: Secondary | ICD-10-CM | POA: Diagnosis not present

## 2020-02-17 DIAGNOSIS — Z87891 Personal history of nicotine dependence: Secondary | ICD-10-CM | POA: Diagnosis not present

## 2020-02-17 DIAGNOSIS — C9002 Multiple myeloma in relapse: Secondary | ICD-10-CM | POA: Diagnosis not present

## 2020-02-17 DIAGNOSIS — Z7189 Other specified counseling: Secondary | ICD-10-CM | POA: Diagnosis not present

## 2020-02-17 DIAGNOSIS — E785 Hyperlipidemia, unspecified: Secondary | ICD-10-CM | POA: Diagnosis not present

## 2020-02-17 DIAGNOSIS — I10 Essential (primary) hypertension: Secondary | ICD-10-CM | POA: Diagnosis not present

## 2020-02-17 DIAGNOSIS — E1165 Type 2 diabetes mellitus with hyperglycemia: Secondary | ICD-10-CM | POA: Diagnosis not present

## 2020-02-24 DIAGNOSIS — J449 Chronic obstructive pulmonary disease, unspecified: Secondary | ICD-10-CM | POA: Diagnosis not present

## 2020-02-24 DIAGNOSIS — J441 Chronic obstructive pulmonary disease with (acute) exacerbation: Secondary | ICD-10-CM | POA: Diagnosis not present

## 2020-02-25 ENCOUNTER — Other Ambulatory Visit: Payer: Self-pay | Admitting: Oncology

## 2020-02-26 ENCOUNTER — Inpatient Hospital Stay: Payer: Medicare HMO | Attending: Oncology

## 2020-02-26 ENCOUNTER — Other Ambulatory Visit: Payer: Self-pay

## 2020-02-26 ENCOUNTER — Other Ambulatory Visit: Payer: Self-pay | Admitting: Oncology

## 2020-02-26 DIAGNOSIS — C9002 Multiple myeloma in relapse: Secondary | ICD-10-CM | POA: Diagnosis not present

## 2020-02-26 DIAGNOSIS — J441 Chronic obstructive pulmonary disease with (acute) exacerbation: Secondary | ICD-10-CM | POA: Diagnosis not present

## 2020-02-26 DIAGNOSIS — C9001 Multiple myeloma in remission: Secondary | ICD-10-CM

## 2020-02-26 DIAGNOSIS — E118 Type 2 diabetes mellitus with unspecified complications: Secondary | ICD-10-CM | POA: Diagnosis not present

## 2020-02-26 DIAGNOSIS — J449 Chronic obstructive pulmonary disease, unspecified: Secondary | ICD-10-CM | POA: Diagnosis not present

## 2020-02-26 LAB — CBC WITH DIFFERENTIAL (CANCER CENTER ONLY)
Abs Immature Granulocytes: 0.03 10*3/uL (ref 0.00–0.07)
Basophils Absolute: 0 10*3/uL (ref 0.0–0.1)
Basophils Relative: 0 %
Eosinophils Absolute: 0 10*3/uL (ref 0.0–0.5)
Eosinophils Relative: 0 %
HCT: 38.4 % — ABNORMAL LOW (ref 39.0–52.0)
Hemoglobin: 12.9 g/dL — ABNORMAL LOW (ref 13.0–17.0)
Immature Granulocytes: 0 %
Lymphocytes Relative: 5 %
Lymphs Abs: 0.5 10*3/uL — ABNORMAL LOW (ref 0.7–4.0)
MCH: 30.1 pg (ref 26.0–34.0)
MCHC: 33.6 g/dL (ref 30.0–36.0)
MCV: 89.7 fL (ref 80.0–100.0)
Monocytes Absolute: 0.6 10*3/uL (ref 0.1–1.0)
Monocytes Relative: 7 %
Neutro Abs: 7.7 10*3/uL (ref 1.7–7.7)
Neutrophils Relative %: 88 %
Platelet Count: 273 10*3/uL (ref 150–400)
RBC: 4.28 MIL/uL (ref 4.22–5.81)
RDW: 14.4 % (ref 11.5–15.5)
WBC Count: 8.8 10*3/uL (ref 4.0–10.5)
nRBC: 0 % (ref 0.0–0.2)

## 2020-02-26 LAB — CMP (CANCER CENTER ONLY)
ALT: 11 U/L (ref 0–44)
AST: 7 U/L — ABNORMAL LOW (ref 15–41)
Albumin: 4 g/dL (ref 3.5–5.0)
Alkaline Phosphatase: 109 U/L (ref 38–126)
Anion gap: 13 (ref 5–15)
BUN: 15 mg/dL (ref 8–23)
CO2: 23 mmol/L (ref 22–32)
Calcium: 10.2 mg/dL (ref 8.9–10.3)
Chloride: 102 mmol/L (ref 98–111)
Creatinine: 1.27 mg/dL — ABNORMAL HIGH (ref 0.61–1.24)
GFR, Est AFR Am: >60 mL/min (ref >60)
GFR, Estimated: 55 mL/min — ABNORMAL LOW (ref >60)
Glucose, Bld: 253 mg/dL — ABNORMAL HIGH (ref 70–99)
Potassium: 3.9 mmol/L (ref 3.5–5.1)
Sodium: 138 mmol/L (ref 135–145)
Total Bilirubin: 0.4 mg/dL (ref 0.3–1.2)
Total Protein: 7.1 g/dL (ref 6.5–8.1)

## 2020-02-27 LAB — KAPPA/LAMBDA LIGHT CHAINS
Kappa free light chain: 13.8 mg/L (ref 3.3–19.4)
Kappa, lambda light chain ratio: 1.27 (ref 0.26–1.65)
Lambda free light chains: 10.9 mg/L (ref 5.7–26.3)

## 2020-03-01 ENCOUNTER — Other Ambulatory Visit: Payer: Self-pay

## 2020-03-01 DIAGNOSIS — C9001 Multiple myeloma in remission: Secondary | ICD-10-CM

## 2020-03-01 LAB — MULTIPLE MYELOMA PANEL, SERUM
Albumin SerPl Elph-Mcnc: 3.7 g/dL (ref 2.9–4.4)
Albumin/Glob SerPl: 1.3 (ref 0.7–1.7)
Alpha 1: 0.3 g/dL (ref 0.0–0.4)
Alpha2 Glob SerPl Elph-Mcnc: 0.9 g/dL (ref 0.4–1.0)
B-Globulin SerPl Elph-Mcnc: 1.1 g/dL (ref 0.7–1.3)
Gamma Glob SerPl Elph-Mcnc: 0.6 g/dL (ref 0.4–1.8)
Globulin, Total: 2.9 g/dL (ref 2.2–3.9)
IgA: 229 mg/dL (ref 61–437)
IgG (Immunoglobin G), Serum: 633 mg/dL (ref 603–1613)
IgM (Immunoglobulin M), Srm: 13 mg/dL — ABNORMAL LOW (ref 15–143)
Total Protein ELP: 6.6 g/dL (ref 6.0–8.5)

## 2020-03-01 MED ORDER — LENALIDOMIDE 15 MG PO CAPS: 15.0000 mg | ORAL_CAPSULE | Freq: Every day | ORAL | 0 refills | Status: DC

## 2020-03-04 ENCOUNTER — Other Ambulatory Visit: Payer: Self-pay

## 2020-03-04 ENCOUNTER — Inpatient Hospital Stay: Payer: Medicare HMO | Attending: Oncology | Admitting: Oncology

## 2020-03-04 VITALS — BP 149/63 | HR 82 | Temp 97.9°F | Resp 18 | Ht 73.0 in | Wt 191.8 lb

## 2020-03-04 DIAGNOSIS — E876 Hypokalemia: Secondary | ICD-10-CM | POA: Diagnosis not present

## 2020-03-04 DIAGNOSIS — C9001 Multiple myeloma in remission: Secondary | ICD-10-CM

## 2020-03-04 DIAGNOSIS — C9002 Multiple myeloma in relapse: Secondary | ICD-10-CM | POA: Insufficient documentation

## 2020-03-04 DIAGNOSIS — D638 Anemia in other chronic diseases classified elsewhere: Secondary | ICD-10-CM | POA: Diagnosis not present

## 2020-03-04 DIAGNOSIS — Z79899 Other long term (current) drug therapy: Secondary | ICD-10-CM | POA: Insufficient documentation

## 2020-03-04 DIAGNOSIS — M1990 Unspecified osteoarthritis, unspecified site: Secondary | ICD-10-CM | POA: Insufficient documentation

## 2020-03-04 DIAGNOSIS — G8929 Other chronic pain: Secondary | ICD-10-CM | POA: Insufficient documentation

## 2020-03-04 NOTE — Progress Notes (Signed)
Hematology and Oncology Follow Up Visit  James [name] [id number] 1945/12/03 74 y.o. 03/04/2020 4:04 PM James [name], MDRamachandran, [name], MD   Principle Diagnosis: 73 year old man with IgA Kappa multiple myeloma presented with bone marrow involvement with 30% plasma cells in 2017.     Prior therapy:  Revlimid and dexamethasone started on January 28 2016. Revlimid at 25 mg daily for 21 days with dexamethasone at 20 mg weekly. Therapy discontinued in January 2018 after achieving a complete response.  He developed relapsed disease in April 2020 with M spike of 3.9 g/dL IgM level of 4707.  Current therapy:   Revlimid 15 mg daily for 21 days out of a 28 day cycle with dexamethasone 40 mg weekly started in May 2020.  He achieved a complete response to therapy in May 2021.  Revlimid 5 mg daily 3 weeks on 1 week off started in May 2021.  Therapy discontinued in June 2021.  Revlimid 15 mg daily with dexamethasone restarted in June 2021.   Interim History:  [name] returns today for a follow-up visit.  Since the last visit, he resumed the higher doses of Revlimid with dexamethasone which was well-tolerated.  He is feeling better overall on this treatment compared to maintenance doses of Revlimid.  He denies any nausea, vomiting or abdominal pain.  He denies any recent hospitalization or illnesses.  He does report chronic bone pain which is unchanged at this time.                 .    Medications: Reviewed without changes  Current Outpatient Medications  Medication Sig Dispense Refill  . Oxycodone HCl 20 MG TABS Take by mouth.    Marland Kitchen albuterol (VENTOLIN HFA) 108 (90 Base) MCG/ACT inhaler Inhale 2 puffs into the lungs every 4 (four) hours as needed for shortness of breath.    Marland Kitchen amLODipine (NORVASC) 10 MG tablet Take 10 mg by mouth every morning.     Marland Kitchen aspirin-sod bicarb-citric acid (ALKA-SELTZER) 325 MG TBEF tablet Take 650 mg by mouth every 6 (six) hours as needed  (indigestion / headaches).    . bismuth subsalicylate (PEPTO BISMOL) 262 MG/15ML suspension Take 30 mLs by mouth every 6 (six) hours as needed for indigestion or diarrhea or loose stools.    . budesonide (PULMICORT) 0.5 MG/2ML nebulizer solution Take 0.5 mg by nebulization 2 (two) times daily.    Marland Kitchen CLENPIQ 10-3.5-12 MG-GM -GM/160ML SOLN     . colestipol (COLESTID) 1 g tablet     . dexamethasone (DECADRON) 4 MG tablet TAKE 5 TABLETS BY MOUTH ONCE A WEEK 64 tablet 0  . diphenhydrAMINE HCl (ZZZQUIL) 50 MG/30ML LIQD Take 30 mLs by mouth at bedtime as needed (sleep).    . ferrous sulfate 325 (65 FE) MG tablet Take 325 mg by mouth daily.     . finasteride (PROPECIA) 1 MG tablet     . finasteride (PROSCAR) 5 MG tablet Take 5 mg by mouth daily.    . fluconazole (DIFLUCAN) 150 MG tablet Take 150 mg by mouth daily as needed (tickle in throat caused by yeast).    . gabapentin (NEURONTIN) 400 MG capsule Take 400 mg by mouth 3 (three) times daily.     Marland Kitchen glimepiride (AMARYL) 2 MG tablet Take 2 mg by mouth daily.    Marland Kitchen HYDROcodone-acetaminophen (NORCO) 10-325 MG tablet Take 1 tablet by mouth 4 (four) times daily.     Marland Kitchen HYSINGLA ER 60 MG T24A Take 60 mg  by mouth daily.     Marland Kitchen ipratropium-albuterol (DUONEB) 0.5-2.5 (3) MG/3ML SOLN Take 3 mLs by nebulization 3 (three) times daily.    Marland Kitchen lenalidomide (REVLIMID) 15 MG capsule Take 1 capsule (15 mg total) by mouth daily. [employer] Auth # [id number]    Date Obtained 03/01/2020 21 capsule 0  . loperamide (IMODIUM A-D) 2 MG tablet Take 4 mg by mouth 4 (four) times daily as needed for diarrhea or loose stools.    Marland Kitchen losartan (COZAAR) 50 MG tablet Take 50 mg by mouth every morning.     . metFORMIN (GLUCOPHAGE-XR) 500 MG 24 hr tablet Take 500-1,000 mg by mouth See admin instructions. Take 500 mg in the morning and 1000 mg in the evening    . omeprazole (PRILOSEC) 20 MG capsule Take 20 mg by mouth 2 (two) times daily before a meal.     . potassium chloride (KLOR-CON) 10 MEQ tablet      . RESTASIS 0.05 % ophthalmic emulsion Place 1 drop into both eyes 2 (two) times daily as needed for dry eyes.    . Tamsulosin HCl (FLOMAX) 0.4 MG CAPS Take 0.4 mg by mouth 2 (two) times daily.     Marland Kitchen tiZANidine (ZANAFLEX) 4 MG tablet Take 4 mg by mouth 3 (three) times daily.    . traMADol (ULTRAM) 50 MG tablet Take 50 mg by mouth 2 (two) times daily.      No current facility-administered medications for this visit.    Allergies:  Allergies  Allergen Reactions  . Invokana [Canagliflozin] Anaphylaxis    Facial and lip swelling      Physical Exam:     Blood pressure (!) 149/63, pulse 82, temperature 97.9 F (36.6 C), temperature source Temporal, resp. rate 18, height '6\' 1"'$  (1.854 m), weight 191 lb 12.8 oz (87 kg), SpO2 100 %.        ECOG: 1     General appearance: Alert, awake without any distress. Head: Atraumatic without abnormalities Oropharynx: Without any thrush or ulcers. Eyes: No scleral icterus. Lymph nodes: No lymphadenopathy noted in the cervical, supraclavicular, or axillary nodes Heart:regular rate and rhythm, without any murmurs or gallops.   Lung: Clear to auscultation without any rhonchi, wheezes or dullness to percussion. Abdomin: Soft, nontender without any shifting dullness or ascites. Musculoskeletal: No clubbing or cyanosis. Neurological: No motor or sensory deficits. Skin: No rashes or lesions.     .      Lab Results: Lab Results  Component Value Date   WBC 8.8 02/26/2020   HGB 12.9 (L) 02/26/2020   HCT 38.4 (L) 02/26/2020   MCV 89.7 02/26/2020   PLT 273 02/26/2020     Chemistry      Component Value Date/Time   NA 138 02/26/2020 1119   NA 138 06/26/2017 0946   K 3.9 02/26/2020 1119   K 3.8 06/26/2017 0946   CL 102 02/26/2020 1119   CL 105 08/09/2012 0918   CO2 23 02/26/2020 1119   CO2 25 06/26/2017 0946   BUN 15 02/26/2020 1119   BUN 16.4 06/26/2017 0946   CREATININE 1.27 (H) 02/26/2020 1119   CREATININE 1.2 06/26/2017  0946      Component Value Date/Time   CALCIUM 10.2 02/26/2020 1119   CALCIUM 9.3 06/26/2017 0946   ALKPHOS 109 02/26/2020 1119   ALKPHOS 75 06/26/2017 0946   AST 7 (L) 02/26/2020 1119   AST 11 06/26/2017 0946   ALT 11 02/26/2020 1119   ALT 14 06/26/2017 0946  BILITOT 0.4 02/26/2020 1119   BILITOT 0.31 06/26/2017 0946          Results for DAMARIS, GEERS (MRN [id number]) as of 03/04/2020 13:53  Ref. Range 01/08/2020 12:00 02/26/2020 11:19  M Protein SerPl Elph-Mcnc Latest Ref Range: Not Observed g/dL 0.3 (H) Not Observed  IFE 1 Unknown Comment (A) Comment (A)  Globulin, Total Latest Ref Range: 2.2 - 3.9 g/dL 2.6 2.9  B-Globulin SerPl Elph-Mcnc Latest Ref Range: 0.7 - 1.3 g/dL 1.1 1.1  IgG (Immunoglobin G), Serum Latest Ref Range: 603 - 1,613 mg/dL 560 (L) 633  IgM (Immunoglobulin M), Srm Latest Ref Range: 15 - 143 mg/dL 11 (L) 13 (L)  IgA Latest Ref Range: 61 - 437 mg/dL 332 229       Impression and Plan:  74 year old with:  1.  IgA kappa multiple myeloma diagnosed in 2017.  He had presented initially with MGUS and subsequently developed increased bone marrow involvement.   He is currently on Revlimid and dexamethasone which is well-tolerated.  Protein studies obtained on February 26, 2020 were personally reviewed and showed a complete response to therapy with M spike is no longer detected.  His IgA level is within normal range with serum light chains are normal normal including kappa to lambda ratio.  Risks and benefits of continuing this therapy long-term were reviewed and he is agreeable to continue.  Feels better on this therapy which keep his disease under control.     2.  Chronic back and hip pain: Mostly unrelated to multiple myeloma related to arthritis.  Pain is manageable.  3.  Anemia: Hemoglobin is improving close to normal range at this time.  His anemia is related to plasma cell disorder and chronic disease.  4.  Hypokalemia: Related to Revlimid.   Potassium is adequate at this time and does not require any additional replacement.  5.  Follow-up: In 6 to 8 weeks for repeat follow-up.  30  minutes were spent on this encounter.  The time was dedicated to reviewing laboratory data, discussing disease status update and outlining future plan of care reviewed.   Zola Button, MD 8/5/20214:04 PM

## 2020-03-21 DIAGNOSIS — Z03818 Encounter for observation for suspected exposure to other biological agents ruled out: Secondary | ICD-10-CM | POA: Diagnosis not present

## 2020-03-21 DIAGNOSIS — Z20822 Contact with and (suspected) exposure to covid-19: Secondary | ICD-10-CM | POA: Diagnosis not present

## 2020-03-22 DIAGNOSIS — J449 Chronic obstructive pulmonary disease, unspecified: Secondary | ICD-10-CM | POA: Diagnosis not present

## 2020-03-22 DIAGNOSIS — E785 Hyperlipidemia, unspecified: Secondary | ICD-10-CM | POA: Diagnosis not present

## 2020-03-22 DIAGNOSIS — I10 Essential (primary) hypertension: Secondary | ICD-10-CM | POA: Diagnosis not present

## 2020-03-25 DIAGNOSIS — J441 Chronic obstructive pulmonary disease with (acute) exacerbation: Secondary | ICD-10-CM | POA: Diagnosis not present

## 2020-03-25 DIAGNOSIS — J449 Chronic obstructive pulmonary disease, unspecified: Secondary | ICD-10-CM | POA: Diagnosis not present

## 2020-03-28 DIAGNOSIS — J441 Chronic obstructive pulmonary disease with (acute) exacerbation: Secondary | ICD-10-CM | POA: Diagnosis not present

## 2020-03-28 DIAGNOSIS — J449 Chronic obstructive pulmonary disease, unspecified: Secondary | ICD-10-CM | POA: Diagnosis not present

## 2020-04-01 ENCOUNTER — Other Ambulatory Visit: Payer: Self-pay | Admitting: Oncology

## 2020-04-01 DIAGNOSIS — C9001 Multiple myeloma in remission: Secondary | ICD-10-CM

## 2020-04-06 ENCOUNTER — Other Ambulatory Visit: Payer: Self-pay

## 2020-04-06 DIAGNOSIS — Z0100 Encounter for examination of eyes and vision without abnormal findings: Secondary | ICD-10-CM | POA: Diagnosis not present

## 2020-04-06 DIAGNOSIS — J449 Chronic obstructive pulmonary disease, unspecified: Secondary | ICD-10-CM | POA: Diagnosis not present

## 2020-04-06 DIAGNOSIS — K2100 Gastro-esophageal reflux disease with esophagitis, without bleeding: Secondary | ICD-10-CM | POA: Diagnosis not present

## 2020-04-06 DIAGNOSIS — C9001 Multiple myeloma in remission: Secondary | ICD-10-CM

## 2020-04-06 MED ORDER — LENALIDOMIDE 15 MG PO CAPS: 15.0000 mg | ORAL_CAPSULE | Freq: Every day | ORAL | 0 refills | Status: DC

## 2020-04-27 DIAGNOSIS — E785 Hyperlipidemia, unspecified: Secondary | ICD-10-CM | POA: Diagnosis not present

## 2020-04-27 DIAGNOSIS — Z23 Encounter for immunization: Secondary | ICD-10-CM | POA: Diagnosis not present

## 2020-04-27 DIAGNOSIS — E118 Type 2 diabetes mellitus with unspecified complications: Secondary | ICD-10-CM | POA: Diagnosis not present

## 2020-04-27 DIAGNOSIS — I10 Essential (primary) hypertension: Secondary | ICD-10-CM | POA: Diagnosis not present

## 2020-04-27 DIAGNOSIS — J449 Chronic obstructive pulmonary disease, unspecified: Secondary | ICD-10-CM | POA: Diagnosis not present

## 2020-04-27 DIAGNOSIS — K2100 Gastro-esophageal reflux disease with esophagitis, without bleeding: Secondary | ICD-10-CM | POA: Diagnosis not present

## 2020-04-28 ENCOUNTER — Inpatient Hospital Stay: Payer: Medicare HMO | Attending: Oncology

## 2020-04-28 DIAGNOSIS — J449 Chronic obstructive pulmonary disease, unspecified: Secondary | ICD-10-CM | POA: Diagnosis not present

## 2020-04-28 DIAGNOSIS — J441 Chronic obstructive pulmonary disease with (acute) exacerbation: Secondary | ICD-10-CM | POA: Diagnosis not present

## 2020-04-29 ENCOUNTER — Other Ambulatory Visit: Payer: Self-pay | Admitting: Oncology

## 2020-04-29 DIAGNOSIS — C9001 Multiple myeloma in remission: Secondary | ICD-10-CM

## 2020-04-30 IMAGING — DX DG BONE SURVEY MET
8 of 10 series · 8 of 10 positions shown · non-contrast
Comparison: Multiple prior metastatic bone surveys, most recent
dated 12/28/2016.

CLINICAL DATA: History multiple myeloma.  Right thigh and leg pain.

EXAM:
METASTATIC BONE SURVEY

[skull lat]
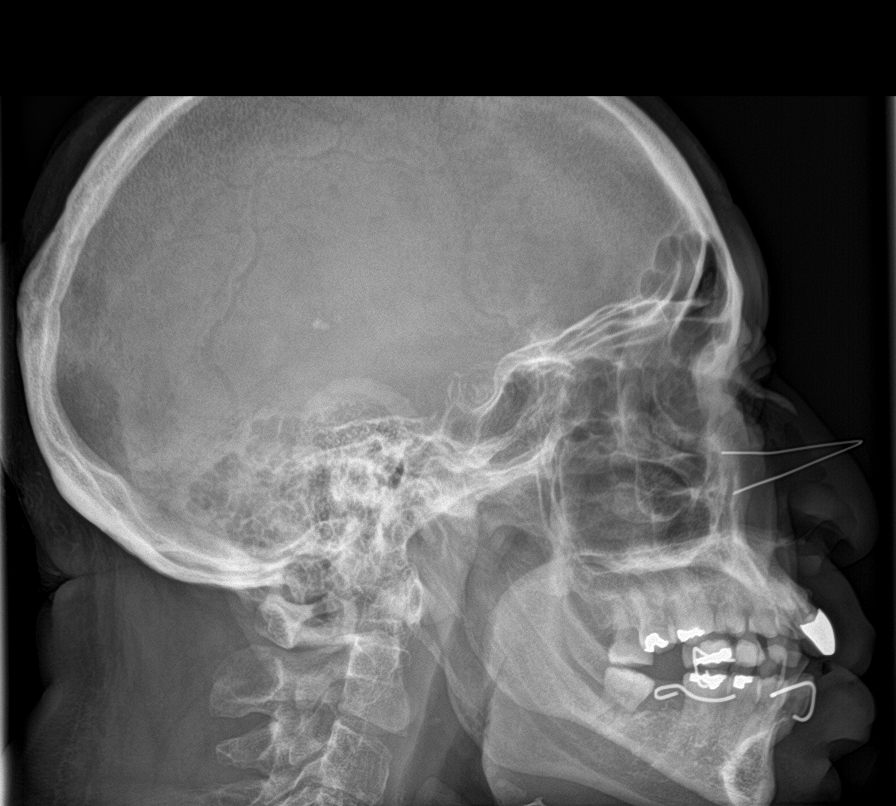

[shoulder ap (1 of 2)]
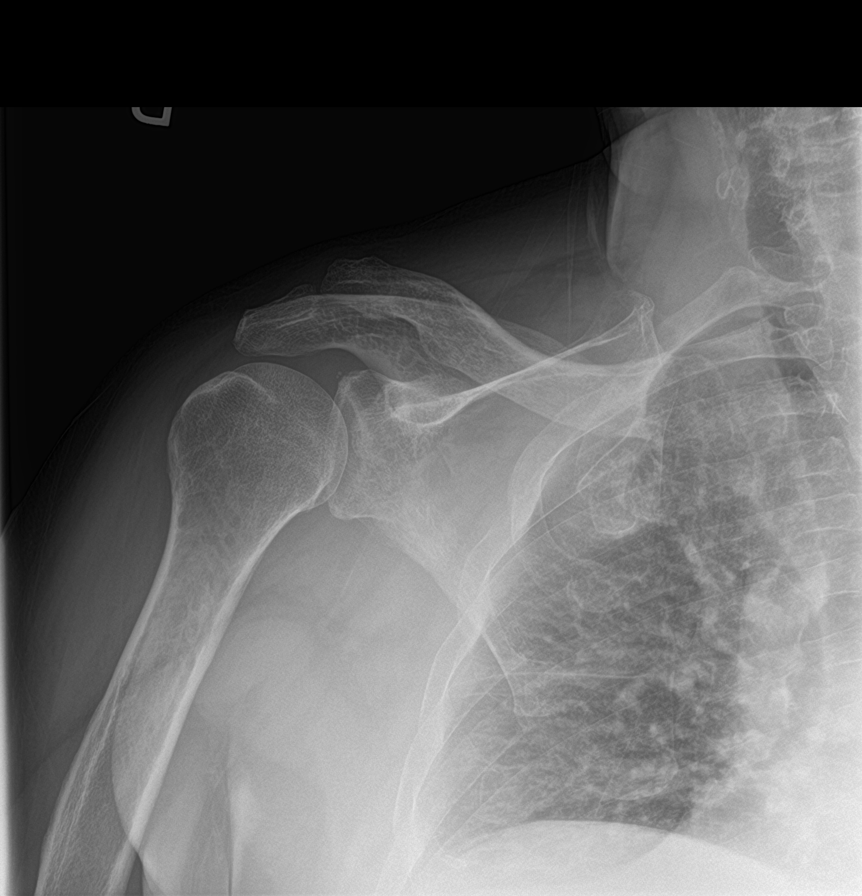

[shoulder ap (2 of 2)]
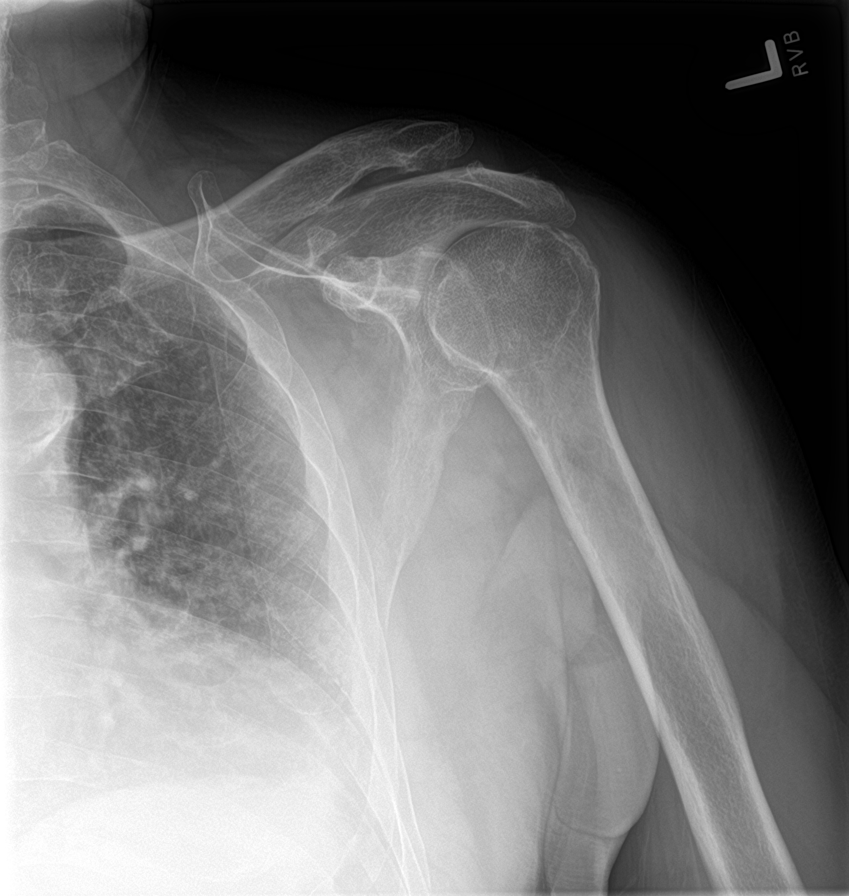

[humerus ap (1 of 2)]
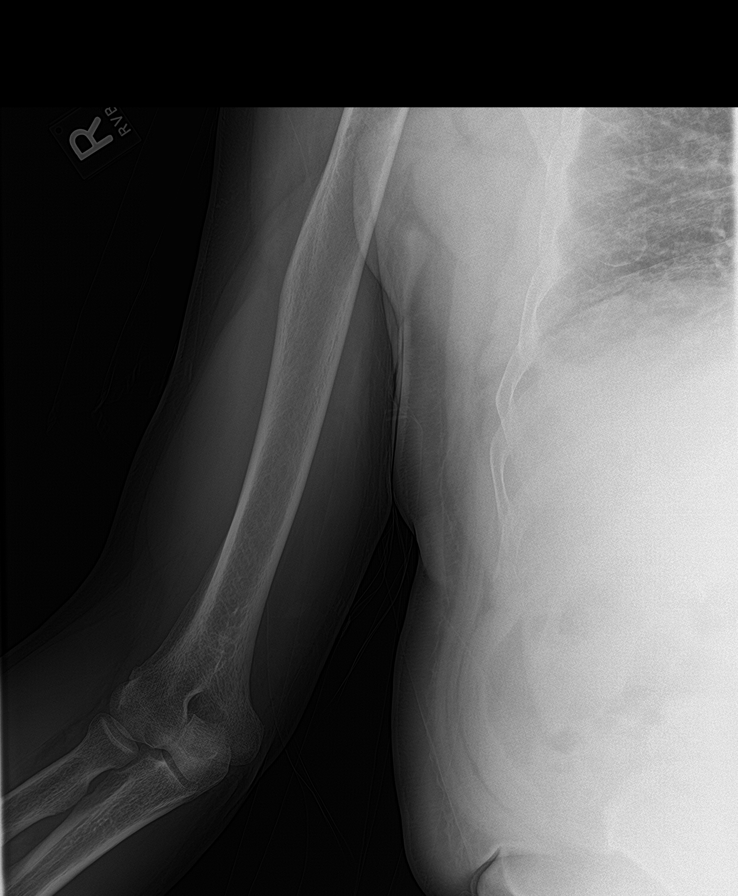

[humerus ap (2 of 2)]
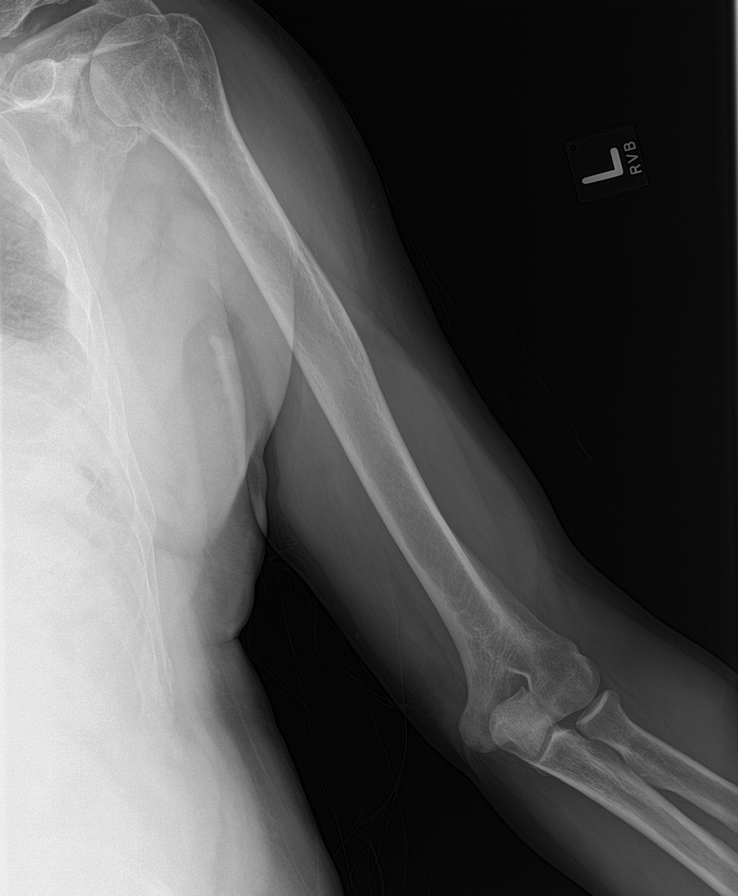

[forearm ap (1 of 2)]
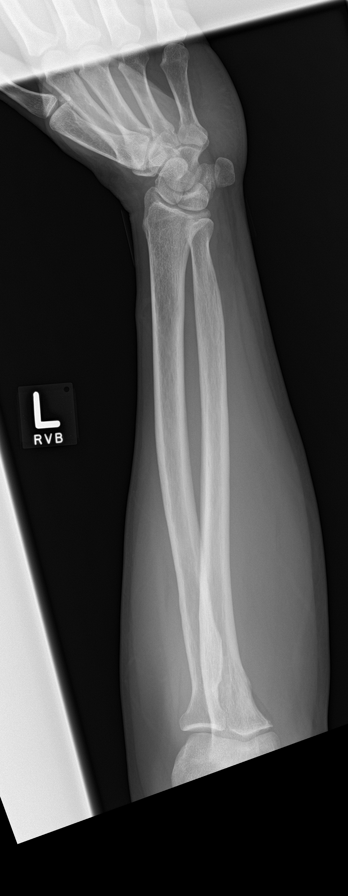

[forearm ap (2 of 2)]
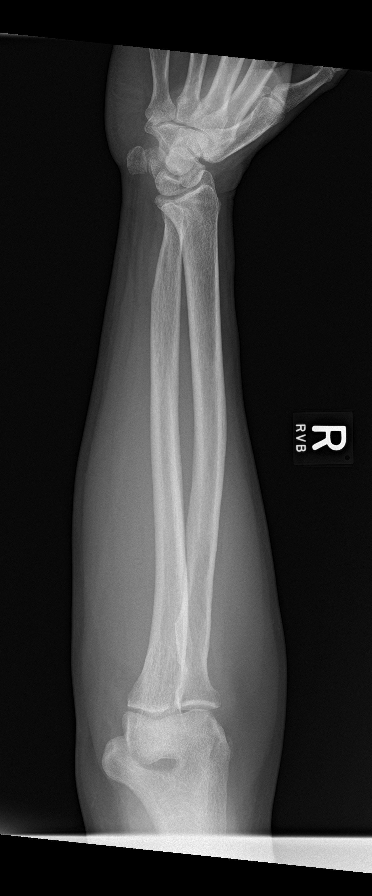

[c-spine ap]
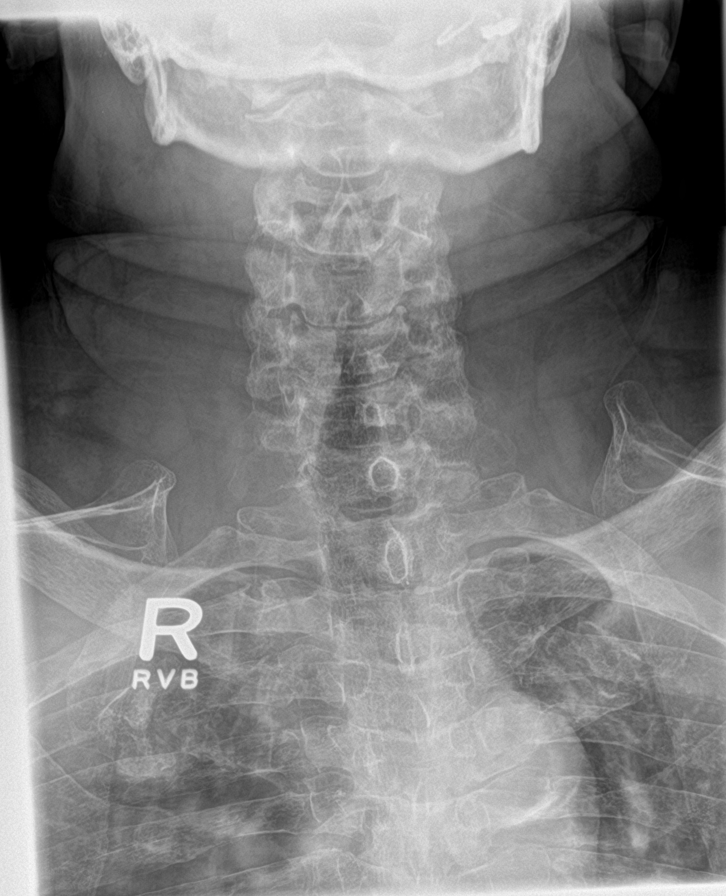

[8 of 10 positions shown; findings below may reference images not displayed]

FINDINGS: Skull: Subtle calvarial lucencies are stable, possibly to myeloma
lesions but more likely arachnoid granulations.

Spine: Lucencies noted on the prior exam are without change. No new
abnormalities. Stable degenerative changes. Stable fractures of L1,
L2 and L4

AP pelvis: Subtle lucencies described previously are less defined.
There are no convincing multiple myeloma lesions.

Femurs: Subtle proximal lucencies are stable. No new or progressive
disease.

Humeri: Subtle proximal lucencies are stable. No new or progressive
disease.

Forearms: No lucent or osteoblastic lesions.

Tibia and fibulas: No lucent or osteoblastic lesions. Old healed
fracture of the proximal right fibula.

AP chest: No convincing osteolytic/lucent rib lesion. No acute
findings. No change.
IMPRESSION: 1. No evidence of progression of multiple myeloma.
2. There are subtle lucent lesions in the calvarium, proximal humeri
and proximal femurs, as well as the spine, unchanged from the prior
exams, which may reflect stable multiple myeloma lesions.
3. There stable fractures of L1, L2 and L4.  No new fractures.

## 2020-05-03 ENCOUNTER — Other Ambulatory Visit: Payer: Self-pay | Admitting: *Deleted

## 2020-05-03 DIAGNOSIS — C9001 Multiple myeloma in remission: Secondary | ICD-10-CM

## 2020-05-03 MED ORDER — LENALIDOMIDE 15 MG PO CAPS: 15.0000 mg | ORAL_CAPSULE | Freq: Every day | ORAL | 0 refills | Status: DC

## 2020-05-04 ENCOUNTER — Inpatient Hospital Stay: Payer: Medicare HMO | Attending: Oncology

## 2020-05-04 ENCOUNTER — Other Ambulatory Visit: Payer: Self-pay

## 2020-05-04 DIAGNOSIS — C9002 Multiple myeloma in relapse: Secondary | ICD-10-CM | POA: Diagnosis present

## 2020-05-04 DIAGNOSIS — Z7952 Long term (current) use of systemic steroids: Secondary | ICD-10-CM | POA: Insufficient documentation

## 2020-05-04 DIAGNOSIS — E876 Hypokalemia: Secondary | ICD-10-CM | POA: Diagnosis not present

## 2020-05-04 DIAGNOSIS — Z79899 Other long term (current) drug therapy: Secondary | ICD-10-CM | POA: Insufficient documentation

## 2020-05-04 DIAGNOSIS — Z7984 Long term (current) use of oral hypoglycemic drugs: Secondary | ICD-10-CM | POA: Diagnosis not present

## 2020-05-04 DIAGNOSIS — D649 Anemia, unspecified: Secondary | ICD-10-CM | POA: Diagnosis not present

## 2020-05-04 DIAGNOSIS — Z7982 Long term (current) use of aspirin: Secondary | ICD-10-CM | POA: Diagnosis not present

## 2020-05-04 DIAGNOSIS — C9000 Multiple myeloma not having achieved remission: Secondary | ICD-10-CM | POA: Diagnosis not present

## 2020-05-04 DIAGNOSIS — Z7951 Long term (current) use of inhaled steroids: Secondary | ICD-10-CM | POA: Insufficient documentation

## 2020-05-04 DIAGNOSIS — C9001 Multiple myeloma in remission: Secondary | ICD-10-CM

## 2020-05-04 LAB — CBC WITH DIFFERENTIAL (CANCER CENTER ONLY)
Abs Immature Granulocytes: 0.02 10*3/uL (ref 0.00–0.07)
Basophils Absolute: 0 10*3/uL (ref 0.0–0.1)
Basophils Relative: 1 %
Eosinophils Absolute: 0.3 10*3/uL (ref 0.0–0.5)
Eosinophils Relative: 4 %
HCT: 38.9 % — ABNORMAL LOW (ref 39.0–52.0)
Hemoglobin: 13 g/dL (ref 13.0–17.0)
Immature Granulocytes: 0 %
Lymphocytes Relative: 19 %
Lymphs Abs: 1.4 10*3/uL (ref 0.7–4.0)
MCH: 29.7 pg (ref 26.0–34.0)
MCHC: 33.4 g/dL (ref 30.0–36.0)
MCV: 88.8 fL (ref 80.0–100.0)
Monocytes Absolute: 0.8 10*3/uL (ref 0.1–1.0)
Monocytes Relative: 11 %
Neutro Abs: 4.7 10*3/uL (ref 1.7–7.7)
Neutrophils Relative %: 65 %
Platelet Count: 185 10*3/uL (ref 150–400)
RBC: 4.38 MIL/uL (ref 4.22–5.81)
RDW: 15.6 % — ABNORMAL HIGH (ref 11.5–15.5)
WBC Count: 7.2 10*3/uL (ref 4.0–10.5)
nRBC: 0 % (ref 0.0–0.2)

## 2020-05-04 LAB — CMP (CANCER CENTER ONLY)
ALT: 14 U/L (ref 0–44)
AST: 10 U/L — ABNORMAL LOW (ref 15–41)
Albumin: 3.6 g/dL (ref 3.5–5.0)
Alkaline Phosphatase: 87 U/L (ref 38–126)
Anion gap: 7 (ref 5–15)
BUN: 9 mg/dL (ref 8–23)
CO2: 27 mmol/L (ref 22–32)
Calcium: 9.1 mg/dL (ref 8.9–10.3)
Chloride: 105 mmol/L (ref 98–111)
Creatinine: 0.95 mg/dL (ref 0.61–1.24)
GFR, Estimated: >60 mL/min (ref >60)
Glucose, Bld: 114 mg/dL — ABNORMAL HIGH (ref 70–99)
Potassium: 3.7 mmol/L (ref 3.5–5.1)
Sodium: 139 mmol/L (ref 135–145)
Total Bilirubin: 0.4 mg/dL (ref 0.3–1.2)
Total Protein: 6.8 g/dL (ref 6.5–8.1)

## 2020-05-05 ENCOUNTER — Inpatient Hospital Stay (HOSPITAL_BASED_OUTPATIENT_CLINIC_OR_DEPARTMENT_OTHER): Payer: Medicare HMO | Admitting: Oncology

## 2020-05-05 ENCOUNTER — Other Ambulatory Visit: Payer: Self-pay

## 2020-05-05 VITALS — BP 105/62 | HR 99 | Temp 97.6°F | Resp 18 | Ht 73.0 in | Wt 188.0 lb

## 2020-05-05 DIAGNOSIS — Z7982 Long term (current) use of aspirin: Secondary | ICD-10-CM | POA: Diagnosis not present

## 2020-05-05 DIAGNOSIS — C9001 Multiple myeloma in remission: Secondary | ICD-10-CM | POA: Diagnosis not present

## 2020-05-05 DIAGNOSIS — D649 Anemia, unspecified: Secondary | ICD-10-CM | POA: Diagnosis not present

## 2020-05-05 DIAGNOSIS — C9000 Multiple myeloma not having achieved remission: Secondary | ICD-10-CM | POA: Diagnosis not present

## 2020-05-05 DIAGNOSIS — E876 Hypokalemia: Secondary | ICD-10-CM | POA: Diagnosis not present

## 2020-05-05 DIAGNOSIS — Z7984 Long term (current) use of oral hypoglycemic drugs: Secondary | ICD-10-CM | POA: Diagnosis not present

## 2020-05-05 DIAGNOSIS — Z7951 Long term (current) use of inhaled steroids: Secondary | ICD-10-CM | POA: Diagnosis not present

## 2020-05-05 DIAGNOSIS — Z7952 Long term (current) use of systemic steroids: Secondary | ICD-10-CM | POA: Diagnosis not present

## 2020-05-05 DIAGNOSIS — Z79899 Other long term (current) drug therapy: Secondary | ICD-10-CM | POA: Diagnosis not present

## 2020-05-05 LAB — KAPPA/LAMBDA LIGHT CHAINS
Kappa free light chain: 21.5 mg/L — ABNORMAL HIGH (ref 3.3–19.4)
Kappa, lambda light chain ratio: 1.4 (ref 0.26–1.65)
Lambda free light chains: 15.4 mg/L (ref 5.7–26.3)

## 2020-05-05 NOTE — Progress Notes (Signed)
Hematology and Oncology Follow Up Visit  James [name] [id number] [birth year] 74 y.o. 05/05/2020 1:38 PM James [name], MDRamachandran, James, James [name]   Principle Diagnosis: 74 year old man with multiple myeloma diagnosed in 2017.  He was found to have IgA Kappa with bone marrow involvement with 30% plasma cells.    Prior therapy:  Revlimid and dexamethasone started on January 28 2016. Revlimid at 25 mg daily for 21 days with dexamethasone at 20 mg weekly. Therapy discontinued in January 2018 after achieving a complete response.  He developed relapsed disease in April 2020 with M spike of 3.9 g/dL IgM level of 4707.  Current therapy:   Revlimid 15 mg daily for 21 days out of a 28 day cycle with dexamethasone 40 mg weekly started in May 2020.  He achieved a complete response to therapy in May 2021.  Revlimid 5 mg daily 3 weeks on 1 week off started in May 2021.  Therapy discontinued in June 2021.  Revlimid 15 mg daily with dexamethasone restarted in June 2021.   Interim History:  James [name] returns today for repeat evaluation.  Since the last visit, he reports no major changes in his health.  He has tolerated Revlimid at the current dose with dexamethasone without any new complaints.  He denies any nausea vomiting or abdominal pain.  He denies any worsening bone pain or pathological fractures.  He denies any recent falls or syncope.  He is ambulating without any difficulties at this time.                 .    Medications: Updated on review.  Current Outpatient Medications  Medication Sig Dispense Refill  . albuterol (VENTOLIN HFA) 108 (90 Base) MCG/ACT inhaler Inhale 2 puffs into the lungs every 4 (four) hours as needed for shortness of breath.    Marland Kitchen amLODipine (NORVASC) 10 MG tablet Take 10 mg by mouth every morning.     Marland Kitchen aspirin-sod bicarb-citric acid (ALKA-SELTZER) 325 MG TBEF tablet Take 650 mg by mouth every 6 (six) hours as needed (indigestion / headaches).     . bismuth subsalicylate (PEPTO BISMOL) 262 MG/15ML suspension Take 30 mLs by mouth every 6 (six) hours as needed for indigestion or diarrhea or loose stools.    . budesonide (PULMICORT) 0.5 MG/2ML nebulizer solution Take 0.5 mg by nebulization 2 (two) times daily.    Marland Kitchen CLENPIQ 10-3.5-12 MG-GM -GM/160ML SOLN     . colestipol (COLESTID) 1 g tablet     . dexamethasone (DECADRON) 4 MG tablet TAKE 5 TABLETS BY MOUTH ONCE A WEEK 64 tablet 0  . diphenhydrAMINE HCl (ZZZQUIL) 50 MG/30ML LIQD Take 30 mLs by mouth at bedtime as needed (sleep).    . ferrous sulfate 325 (65 FE) MG tablet Take 325 mg by mouth daily.     . finasteride (PROPECIA) 1 MG tablet     . finasteride (PROSCAR) 5 MG tablet Take 5 mg by mouth daily.    . fluconazole (DIFLUCAN) 150 MG tablet Take 150 mg by mouth daily as needed (tickle in throat caused by yeast).    . gabapentin (NEURONTIN) 400 MG capsule Take 400 mg by mouth 3 (three) times daily.     Marland Kitchen glimepiride (AMARYL) 2 MG tablet Take 2 mg by mouth daily.    Marland Kitchen HYDROcodone-acetaminophen (NORCO) 10-325 MG tablet Take 1 tablet by mouth 4 (four) times daily.     Marland Kitchen HYSINGLA ER 60 MG T24A Take 60 mg by mouth daily.     Marland Kitchen  ipratropium-albuterol (DUONEB) 0.5-2.5 (3) MG/3ML SOLN Take 3 mLs by nebulization 3 (three) times daily.    Marland Kitchen lenalidomide (REVLIMID) 15 MG capsule Take 1 capsule (15 mg total) by mouth daily. [employer] Auth # [id number]   Date Obtained 05/03/2020 21 capsule 0  . loperamide (IMODIUM A-D) 2 MG tablet Take 4 mg by mouth 4 (four) times daily as needed for diarrhea or loose stools.    Marland Kitchen losartan (COZAAR) 50 MG tablet Take 50 mg by mouth every morning.     . metFORMIN (GLUCOPHAGE-XR) 500 MG 24 hr tablet Take 500-1,000 mg by mouth See admin instructions. Take 500 mg in the morning and 1000 mg in the evening    . omeprazole (PRILOSEC) 20 MG capsule Take 20 mg by mouth 2 (two) times daily before a meal.     . Oxycodone HCl 20 MG TABS Take by mouth.    . potassium chloride (KLOR-CON)  10 MEQ tablet     . RESTASIS 0.05 % ophthalmic emulsion Place 1 drop into both eyes 2 (two) times daily as needed for dry eyes.    . Tamsulosin HCl (FLOMAX) 0.4 MG CAPS Take 0.4 mg by mouth 2 (two) times daily.     Marland Kitchen tiZANidine (ZANAFLEX) 4 MG tablet Take 4 mg by mouth 3 (three) times daily.    . traMADol (ULTRAM) 50 MG tablet Take 50 mg by mouth 2 (two) times daily.      No current facility-administered medications for this visit.    Allergies:  Allergies  Allergen Reactions  . Invokana [Canagliflozin] Anaphylaxis    Facial and lip swelling      Physical Exam:      Blood pressure 105/62, pulse 99, temperature 97.6 F (36.4 C), temperature source Tympanic, resp. rate 18, height _0  (1.854 m), weight 188 lb (85.3 kg), SpO2 99 %.        ECOG: 1     General appearance: Comfortable appearing without any discomfort Head: Normocephalic without any trauma Oropharynx: Mucous membranes are moist and pink without any thrush or ulcers. Eyes: Pupils are equal and round reactive to light. Lymph nodes: No cervical, supraclavicular, inguinal or axillary lymphadenopathy.   Heart:regular rate and rhythm.  S1 and S2 without leg edema. Lung: Clear without any rhonchi or wheezes.  No dullness to percussion. Abdomin: Soft, nontender, nondistended with good bowel sounds.  No hepatosplenomegaly. Musculoskeletal: No joint deformity or effusion.  Full range of motion noted. Neurological: No deficits noted on motor, sensory and deep tendon reflex exam. Skin: No petechial rash or dryness.  Appeared moist.       .      Lab Results: Lab Results  Component Value Date   WBC 7.2 05/04/2020   HGB 13.0 05/04/2020   HCT 38.9 (L) 05/04/2020   MCV 88.8 05/04/2020   PLT 185 05/04/2020     Chemistry      Component Value Date/Time   NA 139 05/04/2020 1338   NA 138 06/26/2017 0946   K 3.7 05/04/2020 1338   K 3.8 06/26/2017 0946   CL 105 05/04/2020 1338   CL 105 08/09/2012 0918    CO2 27 05/04/2020 1338   CO2 25 06/26/2017 0946   BUN 9 05/04/2020 1338   BUN 16.4 06/26/2017 0946   CREATININE 0.95 05/04/2020 1338   CREATININE 1.2 06/26/2017 0946      Component Value Date/Time   CALCIUM 9.1 05/04/2020 1338   CALCIUM 9.3 06/26/2017 0946   ALKPHOS 87 05/04/2020 1338  ALKPHOS 75 06/26/2017 0946   AST 10 (L) 05/04/2020 1338   AST 11 06/26/2017 0946   ALT 14 05/04/2020 1338   ALT 14 06/26/2017 0946   BILITOT 0.4 05/04/2020 1338   BILITOT 0.31 06/26/2017 0946                Impression and Plan:  74 year old with:  1.  Multiple myeloma diagnosed in 2017.  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   He has tolerated Revlimid and dexamethasone without any major complications.  Laboratory data from May 04, 2020 showed normalization of his hemoglobin and normal electrolytes.  Protein studies are currently pending but showed a near complete response in July 2021.  Risks and benefits of continuing this treatment were discussed at this time.  Complications associated with the relevant include myelosuppression, rash, neuropathy among others were reviewed.  He is agreeable to continue at this time.     2.  Chronic back and hip pain: Chronic in nature and related to arthritis and less likely related to multiple myeloma.  3.  Anemia: Resolved at this time with normalization of his hemoglobin.  4.  Hypokalemia: Corrected at this time with normal potassium level.  5.  Follow-up: In 8 weeks for repeat follow-up.  30  minutes were spent on this visit.  The time was dedicated to reviewing laboratory data, discussing treatment options and outlining future plan of care.   Zola Button, James [name] 10/6/20211:38 PM

## 2020-05-07 LAB — MULTIPLE MYELOMA PANEL, SERUM
Albumin SerPl Elph-Mcnc: 3.6 g/dL (ref 2.9–4.4)
Albumin/Glob SerPl: 1.5 (ref 0.7–1.7)
Alpha 1: 0.2 g/dL (ref 0.0–0.4)
Alpha2 Glob SerPl Elph-Mcnc: 0.9 g/dL (ref 0.4–1.0)
B-Globulin SerPl Elph-Mcnc: 0.9 g/dL (ref 0.7–1.3)
Gamma Glob SerPl Elph-Mcnc: 0.5 g/dL (ref 0.4–1.8)
Globulin, Total: 2.5 g/dL (ref 2.2–3.9)
IgA: 160 mg/dL (ref 61–437)
IgG (Immunoglobin G), Serum: 516 mg/dL — ABNORMAL LOW (ref 603–1613)
IgM (Immunoglobulin M), Srm: 13 mg/dL — ABNORMAL LOW (ref 15–143)
Total Protein ELP: 6.1 g/dL (ref 6.0–8.5)

## 2020-05-11 DIAGNOSIS — M25531 Pain in right wrist: Secondary | ICD-10-CM | POA: Diagnosis not present

## 2020-05-12 DIAGNOSIS — M48062 Spinal stenosis, lumbar region with neurogenic claudication: Secondary | ICD-10-CM | POA: Diagnosis not present

## 2020-05-23 ENCOUNTER — Other Ambulatory Visit: Payer: Self-pay | Admitting: Oncology

## 2020-05-23 DIAGNOSIS — C9001 Multiple myeloma in remission: Secondary | ICD-10-CM

## 2020-05-28 DIAGNOSIS — J449 Chronic obstructive pulmonary disease, unspecified: Secondary | ICD-10-CM | POA: Diagnosis not present

## 2020-05-28 DIAGNOSIS — J441 Chronic obstructive pulmonary disease with (acute) exacerbation: Secondary | ICD-10-CM | POA: Diagnosis not present

## 2020-06-17 DIAGNOSIS — Z87891 Personal history of nicotine dependence: Secondary | ICD-10-CM | POA: Diagnosis not present

## 2020-06-17 DIAGNOSIS — Z6826 Body mass index (BMI) 26.0-26.9, adult: Secondary | ICD-10-CM | POA: Diagnosis not present

## 2020-06-17 DIAGNOSIS — I10 Essential (primary) hypertension: Secondary | ICD-10-CM | POA: Diagnosis not present

## 2020-06-17 DIAGNOSIS — E1165 Type 2 diabetes mellitus with hyperglycemia: Secondary | ICD-10-CM | POA: Diagnosis not present

## 2020-06-17 DIAGNOSIS — E785 Hyperlipidemia, unspecified: Secondary | ICD-10-CM | POA: Diagnosis not present

## 2020-06-17 DIAGNOSIS — C9002 Multiple myeloma in relapse: Secondary | ICD-10-CM | POA: Diagnosis not present

## 2020-06-19 ENCOUNTER — Other Ambulatory Visit: Payer: Self-pay | Admitting: Oncology

## 2020-06-19 DIAGNOSIS — C9001 Multiple myeloma in remission: Secondary | ICD-10-CM

## 2020-06-21 DIAGNOSIS — E118 Type 2 diabetes mellitus with unspecified complications: Secondary | ICD-10-CM | POA: Diagnosis not present

## 2020-06-22 NOTE — Telephone Encounter (Signed)
Dr. Alen Blew, For your review for refusal or approval. Thanks! Threasa Beards

## 2020-06-23 ENCOUNTER — Other Ambulatory Visit: Payer: Self-pay | Admitting: *Deleted

## 2020-06-23 DIAGNOSIS — C9001 Multiple myeloma in remission: Secondary | ICD-10-CM

## 2020-06-23 MED ORDER — LENALIDOMIDE 15 MG PO CAPS: ORAL_CAPSULE | ORAL | 0 refills | Status: DC

## 2020-06-25 ENCOUNTER — Telehealth: Payer: Self-pay | Admitting: Oncology

## 2020-06-25 NOTE — Telephone Encounter (Signed)
Scheduled per los, patient has been called and notified. 

## 2020-06-28 DIAGNOSIS — J441 Chronic obstructive pulmonary disease with (acute) exacerbation: Secondary | ICD-10-CM | POA: Diagnosis not present

## 2020-06-28 DIAGNOSIS — J449 Chronic obstructive pulmonary disease, unspecified: Secondary | ICD-10-CM | POA: Diagnosis not present

## 2020-06-29 ENCOUNTER — Other Ambulatory Visit: Payer: Self-pay

## 2020-06-29 ENCOUNTER — Inpatient Hospital Stay: Payer: Medicare HMO | Attending: Oncology

## 2020-06-29 DIAGNOSIS — C9002 Multiple myeloma in relapse: Secondary | ICD-10-CM | POA: Diagnosis not present

## 2020-06-29 DIAGNOSIS — C9001 Multiple myeloma in remission: Secondary | ICD-10-CM

## 2020-06-29 LAB — CMP (CANCER CENTER ONLY)
ALT: 9 U/L (ref 0–44)
AST: 8 U/L — ABNORMAL LOW (ref 15–41)
Albumin: 3.5 g/dL (ref 3.5–5.0)
Alkaline Phosphatase: 92 U/L (ref 38–126)
Anion gap: 12 (ref 5–15)
BUN: 8 mg/dL (ref 8–23)
CO2: 25 mmol/L (ref 22–32)
Calcium: 9.2 mg/dL (ref 8.9–10.3)
Chloride: 102 mmol/L (ref 98–111)
Creatinine: 1.09 mg/dL (ref 0.61–1.24)
GFR, Estimated: >60 mL/min (ref >60)
Glucose, Bld: 270 mg/dL — ABNORMAL HIGH (ref 70–99)
Potassium: 3.3 mmol/L — ABNORMAL LOW (ref 3.5–5.1)
Sodium: 139 mmol/L (ref 135–145)
Total Bilirubin: 0.5 mg/dL (ref 0.3–1.2)
Total Protein: 6.9 g/dL (ref 6.5–8.1)

## 2020-06-29 LAB — CBC WITH DIFFERENTIAL (CANCER CENTER ONLY)
Abs Immature Granulocytes: 0.02 10*3/uL (ref 0.00–0.07)
Basophils Absolute: 0.1 10*3/uL (ref 0.0–0.1)
Basophils Relative: 1 %
Eosinophils Absolute: 0.2 10*3/uL (ref 0.0–0.5)
Eosinophils Relative: 3 %
HCT: 39.5 % (ref 39.0–52.0)
Hemoglobin: 13.2 g/dL (ref 13.0–17.0)
Immature Granulocytes: 0 %
Lymphocytes Relative: 18 %
Lymphs Abs: 1.4 10*3/uL (ref 0.7–4.0)
MCH: 30.1 pg (ref 26.0–34.0)
MCHC: 33.4 g/dL (ref 30.0–36.0)
MCV: 90.2 fL (ref 80.0–100.0)
Monocytes Absolute: 0.8 10*3/uL (ref 0.1–1.0)
Monocytes Relative: 10 %
Neutro Abs: 5.2 10*3/uL (ref 1.7–7.7)
Neutrophils Relative %: 68 %
Platelet Count: 202 10*3/uL (ref 150–400)
RBC: 4.38 MIL/uL (ref 4.22–5.81)
RDW: 14.6 % (ref 11.5–15.5)
WBC Count: 7.7 10*3/uL (ref 4.0–10.5)
nRBC: 0 % (ref 0.0–0.2)

## 2020-06-30 LAB — KAPPA/LAMBDA LIGHT CHAINS
Kappa free light chain: 24.9 mg/L — ABNORMAL HIGH (ref 3.3–19.4)
Kappa, lambda light chain ratio: 1.84 — ABNORMAL HIGH (ref 0.26–1.65)
Lambda free light chains: 13.5 mg/L (ref 5.7–26.3)

## 2020-07-01 LAB — MULTIPLE MYELOMA PANEL, SERUM
Albumin SerPl Elph-Mcnc: 3.4 g/dL (ref 2.9–4.4)
Albumin/Glob SerPl: 1.4 (ref 0.7–1.7)
Alpha 1: 0.3 g/dL (ref 0.0–0.4)
Alpha2 Glob SerPl Elph-Mcnc: 0.9 g/dL (ref 0.4–1.0)
B-Globulin SerPl Elph-Mcnc: 1 g/dL (ref 0.7–1.3)
Gamma Glob SerPl Elph-Mcnc: 0.5 g/dL (ref 0.4–1.8)
Globulin, Total: 2.6 g/dL (ref 2.2–3.9)
IgA: 181 mg/dL (ref 61–437)
IgG (Immunoglobin G), Serum: 576 mg/dL — ABNORMAL LOW (ref 603–1613)
IgM (Immunoglobulin M), Srm: 16 mg/dL (ref 15–143)
Total Protein ELP: 6 g/dL (ref 6.0–8.5)

## 2020-07-05 ENCOUNTER — Other Ambulatory Visit: Payer: Self-pay | Admitting: Oncology

## 2020-07-06 ENCOUNTER — Other Ambulatory Visit: Payer: Self-pay

## 2020-07-06 ENCOUNTER — Inpatient Hospital Stay: Payer: Medicare HMO | Attending: Oncology | Admitting: Oncology

## 2020-07-06 VITALS — BP 143/66 | HR 75 | Temp 98.1°F | Resp 18 | Ht 73.0 in | Wt 186.8 lb

## 2020-07-06 DIAGNOSIS — C9001 Multiple myeloma in remission: Secondary | ICD-10-CM

## 2020-07-06 DIAGNOSIS — C9002 Multiple myeloma in relapse: Secondary | ICD-10-CM | POA: Insufficient documentation

## 2020-07-06 DIAGNOSIS — Z79899 Other long term (current) drug therapy: Secondary | ICD-10-CM | POA: Diagnosis not present

## 2020-07-06 NOTE — Progress Notes (Signed)
Hematology and Oncology Follow Up Visit  James [name] [id number] 04/10/1946 74 y.o. 07/06/2020 3:31 PM James [name], MDRamachandran, [name], MD   Principle Diagnosis: 74 year old man with IgA kappa multiple myeloma diagnosed in 2017.  He was found to have 30% plasma cells in his bone marrow biopsy at the time of diagnosis.   Prior therapy:  Revlimid and dexamethasone started on January 28 2016. Revlimid at 25 mg daily for 21 days with dexamethasone at 20 mg weekly. Therapy discontinued in January 2018 after achieving a complete response.  He developed relapsed disease in April 2020 with M spike of 3.9 g/dL IgM level of 4707.  Current therapy:   Revlimid 15 mg daily for 21 days out of a 28 day cycle with dexamethasone 40 mg weekly started in May 2020.  He achieved a complete response to therapy in May 2021.  Revlimid 5 mg daily 3 weeks on 1 week off started in May 2021.  Therapy discontinued in June 2021.  Revlimid 15 mg daily with dexamethasone restarted in June 2021.   Interim History:  James [name] is here for repeat evaluation.  Since the last visit, he reports no major changes in his health.  He continues to tolerate Revlimid at the current dose without any major complaints.  He does report chronic diarrhea and bone pain which has preceded his Revlimid treatment.  He had denied any recent hospitalization or illnesses.  He still able to eat reasonably well and maintaining weight.                 .    Medications: Unchanged on review.  Current Outpatient Medications  Medication Sig Dispense Refill  . albuterol (VENTOLIN HFA) 108 (90 Base) MCG/ACT inhaler Inhale 2 puffs into the lungs every 4 (four) hours as needed for shortness of breath.    Marland Kitchen amLODipine (NORVASC) 10 MG tablet Take 10 mg by mouth every morning.     Marland Kitchen aspirin-sod bicarb-citric acid (ALKA-SELTZER) 325 MG TBEF tablet Take 650 mg by mouth every 6 (six) hours as needed (indigestion / headaches).     . bismuth subsalicylate (PEPTO BISMOL) 262 MG/15ML suspension Take 30 mLs by mouth every 6 (six) hours as needed for indigestion or diarrhea or loose stools.    . budesonide (PULMICORT) 0.5 MG/2ML nebulizer solution Take 0.5 mg by nebulization 2 (two) times daily.    Marland Kitchen CLENPIQ 10-3.5-12 MG-GM -GM/160ML SOLN     . colestipol (COLESTID) 1 g tablet     . dexamethasone (DECADRON) 4 MG tablet TAKE 5 TABLETS BY MOUTH ONCE A WEEK 64 tablet 0  . diphenhydrAMINE HCl (ZZZQUIL) 50 MG/30ML LIQD Take 30 mLs by mouth at bedtime as needed (sleep).    . diphenoxylate-atropine (LOMOTIL) 2.5-0.025 MG tablet TAKE 1 TABLET BY MOUTH TWICE DAILY AS NEEDED FOR  DIARRHEA  OR  LOOSE  STOOLS 30 tablet 0  . ferrous sulfate 325 (65 FE) MG tablet Take 325 mg by mouth daily.     . finasteride (PROPECIA) 1 MG tablet     . finasteride (PROSCAR) 5 MG tablet Take 5 mg by mouth daily.    . fluconazole (DIFLUCAN) 150 MG tablet Take 150 mg by mouth daily as needed (tickle in throat caused by yeast).    . gabapentin (NEURONTIN) 400 MG capsule Take 400 mg by mouth 3 (three) times daily.     Marland Kitchen glimepiride (AMARYL) 2 MG tablet Take 2 mg by mouth daily.    Marland Kitchen HYDROcodone-acetaminophen (NORCO) 10-325  MG tablet Take 1 tablet by mouth 4 (four) times daily.     Marland Kitchen HYSINGLA ER 60 MG T24A Take 60 mg by mouth daily.     Marland Kitchen ipratropium-albuterol (DUONEB) 0.5-2.5 (3) MG/3ML SOLN Take 3 mLs by nebulization 3 (three) times daily.    Marland Kitchen lenalidomide (REVLIMID) 15 MG capsule Take 1 capsule by mouth daily for 21 days,  then 7 days off, repeat every 28 days 21 capsule 0  . loperamide (IMODIUM A-D) 2 MG tablet Take 4 mg by mouth 4 (four) times daily as needed for diarrhea or loose stools.    Marland Kitchen losartan (COZAAR) 50 MG tablet Take 50 mg by mouth every morning.     . metFORMIN (GLUCOPHAGE-XR) 500 MG 24 hr tablet Take 500-1,000 mg by mouth See admin instructions. Take 500 mg in the morning and 1000 mg in the evening    . omeprazole (PRILOSEC) 20 MG capsule  Take 20 mg by mouth 2 (two) times daily before a meal.     . Oxycodone HCl 20 MG TABS Take by mouth.    . potassium chloride (KLOR-CON) 10 MEQ tablet     . RESTASIS 0.05 % ophthalmic emulsion Place 1 drop into both eyes 2 (two) times daily as needed for dry eyes.    . Tamsulosin HCl (FLOMAX) 0.4 MG CAPS Take 0.4 mg by mouth 2 (two) times daily.     Marland Kitchen tiZANidine (ZANAFLEX) 4 MG tablet Take 4 mg by mouth 3 (three) times daily.    . traMADol (ULTRAM) 50 MG tablet Take 50 mg by mouth 2 (two) times daily.      No current facility-administered medications for this visit.    Allergies:  Allergies  Allergen Reactions  . Invokana [Canagliflozin] Anaphylaxis    Facial and lip swelling      Physical Exam:     Blood pressure (!) 143/66, pulse 75, temperature 98.1 F (36.7 C), temperature source Tympanic, resp. rate 18, height 6' 1" (1.854 m), weight 186 lb 12.8 oz (84.7 kg), SpO2 100 %.          ECOG: 1    General appearance: Alert, awake without any distress. Head: Atraumatic without abnormalities Oropharynx: Without any thrush or ulcers. Eyes: No scleral icterus. Lymph nodes: No lymphadenopathy noted in the cervical, supraclavicular, or axillary nodes Heart:regular rate and rhythm, without any murmurs or gallops.   Lung: Clear to auscultation without any rhonchi, wheezes or dullness to percussion. Abdomin: Soft, nontender without any shifting dullness or ascites. Musculoskeletal: No clubbing or cyanosis. Neurological: No motor or sensory deficits. Skin: No rashes or lesions.       .      Lab Results: Lab Results  Component Value Date   WBC 7.7 06/29/2020   HGB 13.2 06/29/2020   HCT 39.5 06/29/2020   MCV 90.2 06/29/2020   PLT 202 06/29/2020     Chemistry      Component Value Date/Time   NA 139 06/29/2020 1050   NA 138 06/26/2017 0946   K 3.3 (L) 06/29/2020 1050   K 3.8 06/26/2017 0946   CL 102 06/29/2020 1050   CL 105 08/09/2012 0918   CO2 25  06/29/2020 1050   CO2 25 06/26/2017 0946   BUN 8 06/29/2020 1050   BUN 16.4 06/26/2017 0946   CREATININE 1.09 06/29/2020 1050   CREATININE 1.2 06/26/2017 0946      Component Value Date/Time   CALCIUM 9.2 06/29/2020 1050   CALCIUM 9.3 06/26/2017 0946  ALKPHOS 92 06/29/2020 1050   ALKPHOS 75 06/26/2017 0946   AST 8 (L) 06/29/2020 1050   AST 11 06/26/2017 0946   ALT 9 06/29/2020 1050   ALT 14 06/26/2017 0946   BILITOT 0.5 06/29/2020 1050   BILITOT 0.31 06/26/2017 0946           Results for Pesola, VISHRUTH SEOANE (MRN [id number]) as of 07/06/2020 15:32  Ref. Range 02/26/2020 11:19 05/04/2020 13:38 06/29/2020 10:51  M Protein SerPl Elph-Mcnc Latest Ref Range: Not Observed g/dL Not Observed Not Observed Not Observed  IFE 1 Unknown Comment (A) Comment Comment  Globulin, Total Latest Ref Range: 2.2 - 3.9 g/dL 2.9 2.5 2.6  B-Globulin SerPl Elph-Mcnc Latest Ref Range: 0.7 - 1.3 g/dL 1.1 0.9 1.0  IgG (Immunoglobin G), Serum Latest Ref Range: 603 - 1,613 mg/dL 633 516 (L) 576 (L)  IgM (Immunoglobulin M), Srm Latest Ref Range: 15 - 143 mg/dL 13 (L) 13 (L) 16  IgA Latest Ref Range: 61 - 437 mg/dL 229 160 181       Impression and Plan:  74 year old with:  1.   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     He has achieved a complete response on Revlimid with excellent tolerance at this time.  Laboratory data obtained on November 30th 2021 were reviewed showed normal CBC and a hemoglobin of 13.2, normal kidney function and electrolytes.  Protein studies obtained on the same date showed his M spike is no longer detected with normalization of his IgA level.  Risks and benefits of continuing this treatment for the time being were reviewed.  Potential complications that include cytopenia, thrombosis and dermatological toxicity associated with Revlimid were discussed.  He is agreeable to continue at this time.  Alternative treatment options including Velcade-based  therapy and others were reviewed.   2.  Chronic pain: Predominantly related to pruritus in his back with potentially myeloma involvement in his ribs.  He is currently on hydrocodone and managing his pain.  3.  Anemia: Related to multiple myeloma and has resolved at this time.  Hemoglobin is normal.  4.  Hypokalemia: Potassium is borderline but overall stable.  We'll continue to monitor and replace as needed.  5. Diarrhea: Unclear etiology but unlikely to be related to Revlimid.  I asked him to follow-up with gastroenterology regarding this issue.  6.  Follow-up: In 2 months for repeat follow-up.  30  minutes were dedicated to this encounter.  Time was spent on reviewing his disease status, discussing treatment options and future plan of care review.   Zola Button, MD 12/7/20213:31 PM

## 2020-07-18 ENCOUNTER — Other Ambulatory Visit: Payer: Self-pay | Admitting: Oncology

## 2020-07-18 DIAGNOSIS — C9001 Multiple myeloma in remission: Secondary | ICD-10-CM

## 2020-07-28 DIAGNOSIS — J449 Chronic obstructive pulmonary disease, unspecified: Secondary | ICD-10-CM | POA: Diagnosis not present

## 2020-07-28 DIAGNOSIS — J441 Chronic obstructive pulmonary disease with (acute) exacerbation: Secondary | ICD-10-CM | POA: Diagnosis not present

## 2020-08-13 ENCOUNTER — Other Ambulatory Visit: Payer: Self-pay | Admitting: Oncology

## 2020-08-13 DIAGNOSIS — C9001 Multiple myeloma in remission: Secondary | ICD-10-CM

## 2020-08-25 DIAGNOSIS — M5136 Other intervertebral disc degeneration, lumbar region: Secondary | ICD-10-CM | POA: Diagnosis not present

## 2020-08-25 DIAGNOSIS — Z79891 Long term (current) use of opiate analgesic: Secondary | ICD-10-CM | POA: Diagnosis not present

## 2020-08-25 DIAGNOSIS — E118 Type 2 diabetes mellitus with unspecified complications: Secondary | ICD-10-CM | POA: Diagnosis not present

## 2020-08-25 DIAGNOSIS — G894 Chronic pain syndrome: Secondary | ICD-10-CM | POA: Diagnosis not present

## 2020-08-25 DIAGNOSIS — J449 Chronic obstructive pulmonary disease, unspecified: Secondary | ICD-10-CM | POA: Diagnosis not present

## 2020-08-25 DIAGNOSIS — Z79899 Other long term (current) drug therapy: Secondary | ICD-10-CM | POA: Diagnosis not present

## 2020-08-25 DIAGNOSIS — C9002 Multiple myeloma in relapse: Secondary | ICD-10-CM | POA: Diagnosis not present

## 2020-08-25 DIAGNOSIS — E1142 Type 2 diabetes mellitus with diabetic polyneuropathy: Secondary | ICD-10-CM | POA: Diagnosis not present

## 2020-08-25 DIAGNOSIS — M48061 Spinal stenosis, lumbar region without neurogenic claudication: Secondary | ICD-10-CM | POA: Diagnosis not present

## 2020-08-25 DIAGNOSIS — I10 Essential (primary) hypertension: Secondary | ICD-10-CM | POA: Diagnosis not present

## 2020-08-25 DIAGNOSIS — E785 Hyperlipidemia, unspecified: Secondary | ICD-10-CM | POA: Diagnosis not present

## 2020-08-27 DIAGNOSIS — E785 Hyperlipidemia, unspecified: Secondary | ICD-10-CM | POA: Diagnosis not present

## 2020-08-27 DIAGNOSIS — J449 Chronic obstructive pulmonary disease, unspecified: Secondary | ICD-10-CM | POA: Diagnosis not present

## 2020-08-27 DIAGNOSIS — E1165 Type 2 diabetes mellitus with hyperglycemia: Secondary | ICD-10-CM | POA: Diagnosis not present

## 2020-08-27 DIAGNOSIS — I10 Essential (primary) hypertension: Secondary | ICD-10-CM | POA: Diagnosis not present

## 2020-08-28 DIAGNOSIS — J441 Chronic obstructive pulmonary disease with (acute) exacerbation: Secondary | ICD-10-CM | POA: Diagnosis not present

## 2020-08-28 DIAGNOSIS — J449 Chronic obstructive pulmonary disease, unspecified: Secondary | ICD-10-CM | POA: Diagnosis not present

## 2020-09-01 DIAGNOSIS — Z6829 Body mass index (BMI) 29.0-29.9, adult: Secondary | ICD-10-CM | POA: Diagnosis not present

## 2020-09-01 DIAGNOSIS — M48062 Spinal stenosis, lumbar region with neurogenic claudication: Secondary | ICD-10-CM | POA: Diagnosis not present

## 2020-09-01 DIAGNOSIS — I10 Essential (primary) hypertension: Secondary | ICD-10-CM | POA: Diagnosis not present

## 2020-09-02 ENCOUNTER — Inpatient Hospital Stay: Payer: Medicare HMO | Attending: Oncology

## 2020-09-02 ENCOUNTER — Other Ambulatory Visit: Payer: Self-pay

## 2020-09-02 DIAGNOSIS — D649 Anemia, unspecified: Secondary | ICD-10-CM | POA: Insufficient documentation

## 2020-09-02 DIAGNOSIS — M1990 Unspecified osteoarthritis, unspecified site: Secondary | ICD-10-CM | POA: Diagnosis not present

## 2020-09-02 DIAGNOSIS — C9002 Multiple myeloma in relapse: Secondary | ICD-10-CM | POA: Insufficient documentation

## 2020-09-02 DIAGNOSIS — E876 Hypokalemia: Secondary | ICD-10-CM | POA: Diagnosis not present

## 2020-09-02 DIAGNOSIS — Z79899 Other long term (current) drug therapy: Secondary | ICD-10-CM | POA: Insufficient documentation

## 2020-09-02 DIAGNOSIS — C9001 Multiple myeloma in remission: Secondary | ICD-10-CM

## 2020-09-02 LAB — CMP (CANCER CENTER ONLY)
ALT: 15 U/L (ref 0–44)
AST: 8 U/L — ABNORMAL LOW (ref 15–41)
Albumin: 4 g/dL (ref 3.5–5.0)
Alkaline Phosphatase: 86 U/L (ref 38–126)
Anion gap: 12 (ref 5–15)
BUN: 25 mg/dL — ABNORMAL HIGH (ref 8–23)
CO2: 24 mmol/L (ref 22–32)
Calcium: 9.7 mg/dL (ref 8.9–10.3)
Chloride: 100 mmol/L (ref 98–111)
Creatinine: 1.38 mg/dL — ABNORMAL HIGH (ref 0.61–1.24)
GFR, Estimated: 53 mL/min — ABNORMAL LOW (ref >60)
Glucose, Bld: 427 mg/dL — ABNORMAL HIGH (ref 70–99)
Potassium: 4.2 mmol/L (ref 3.5–5.1)
Sodium: 136 mmol/L (ref 135–145)
Total Bilirubin: 0.3 mg/dL (ref 0.3–1.2)
Total Protein: 7.1 g/dL (ref 6.5–8.1)

## 2020-09-02 LAB — CBC WITH DIFFERENTIAL (CANCER CENTER ONLY)
Abs Immature Granulocytes: 0.05 10*3/uL (ref 0.00–0.07)
Basophils Absolute: 0 10*3/uL (ref 0.0–0.1)
Basophils Relative: 0 %
Eosinophils Absolute: 0 10*3/uL (ref 0.0–0.5)
Eosinophils Relative: 0 %
HCT: 37.6 % — ABNORMAL LOW (ref 39.0–52.0)
Hemoglobin: 12.9 g/dL — ABNORMAL LOW (ref 13.0–17.0)
Immature Granulocytes: 1 %
Lymphocytes Relative: 5 %
Lymphs Abs: 0.6 10*3/uL — ABNORMAL LOW (ref 0.7–4.0)
MCH: 30.4 pg (ref 26.0–34.0)
MCHC: 34.3 g/dL (ref 30.0–36.0)
MCV: 88.5 fL (ref 80.0–100.0)
Monocytes Absolute: 1.2 10*3/uL — ABNORMAL HIGH (ref 0.1–1.0)
Monocytes Relative: 11 %
Neutro Abs: 9 10*3/uL — ABNORMAL HIGH (ref 1.7–7.7)
Neutrophils Relative %: 83 %
Platelet Count: 257 10*3/uL (ref 150–400)
RBC: 4.25 MIL/uL (ref 4.22–5.81)
RDW: 15.5 % (ref 11.5–15.5)
WBC Count: 10.8 10*3/uL — ABNORMAL HIGH (ref 4.0–10.5)
nRBC: 0 % (ref 0.0–0.2)

## 2020-09-03 LAB — KAPPA/LAMBDA LIGHT CHAINS
Kappa free light chain: 13.3 mg/L (ref 3.3–19.4)
Kappa, lambda light chain ratio: 1.48 (ref 0.26–1.65)
Lambda free light chains: 9 mg/L (ref 5.7–26.3)

## 2020-09-04 LAB — MULTIPLE MYELOMA PANEL, SERUM
Albumin SerPl Elph-Mcnc: 3.9 g/dL (ref 2.9–4.4)
Albumin/Glob SerPl: 1.5 (ref 0.7–1.7)
Alpha 1: 0.2 g/dL (ref 0.0–0.4)
Alpha2 Glob SerPl Elph-Mcnc: 0.9 g/dL (ref 0.4–1.0)
B-Globulin SerPl Elph-Mcnc: 1.1 g/dL (ref 0.7–1.3)
Gamma Glob SerPl Elph-Mcnc: 0.5 g/dL (ref 0.4–1.8)
Globulin, Total: 2.7 g/dL (ref 2.2–3.9)
IgA: 174 mg/dL (ref 61–437)
IgG (Immunoglobin G), Serum: 531 mg/dL — ABNORMAL LOW (ref 603–1613)
IgM (Immunoglobulin M), Srm: 12 mg/dL — ABNORMAL LOW (ref 15–143)
Total Protein ELP: 6.6 g/dL (ref 6.0–8.5)

## 2020-09-09 ENCOUNTER — Other Ambulatory Visit: Payer: Self-pay

## 2020-09-09 ENCOUNTER — Inpatient Hospital Stay (HOSPITAL_BASED_OUTPATIENT_CLINIC_OR_DEPARTMENT_OTHER): Payer: Medicare HMO | Admitting: Oncology

## 2020-09-09 VITALS — BP 139/63 | HR 90 | Temp 98.9°F | Resp 20 | Ht 73.0 in | Wt 188.4 lb

## 2020-09-09 DIAGNOSIS — D649 Anemia, unspecified: Secondary | ICD-10-CM | POA: Diagnosis not present

## 2020-09-09 DIAGNOSIS — E118 Type 2 diabetes mellitus with unspecified complications: Secondary | ICD-10-CM | POA: Diagnosis not present

## 2020-09-09 DIAGNOSIS — C9002 Multiple myeloma in relapse: Secondary | ICD-10-CM | POA: Diagnosis not present

## 2020-09-09 DIAGNOSIS — E876 Hypokalemia: Secondary | ICD-10-CM | POA: Diagnosis not present

## 2020-09-09 DIAGNOSIS — C9001 Multiple myeloma in remission: Secondary | ICD-10-CM

## 2020-09-09 DIAGNOSIS — M1990 Unspecified osteoarthritis, unspecified site: Secondary | ICD-10-CM | POA: Diagnosis not present

## 2020-09-09 DIAGNOSIS — Z79899 Other long term (current) drug therapy: Secondary | ICD-10-CM | POA: Diagnosis not present

## 2020-09-09 NOTE — Progress Notes (Signed)
Hematology and Oncology Follow Up Visit  James [name] [id number] February 06, 1946 75 y.o. 09/09/2020 3:37 PM James [name], MDRamachandran, [name], MD   Principle Diagnosis: 75 year old man with multiple myeloma diagnosed in 2017.  He was found to have IgA kappa, 30% plasma cells involvement and in the bone marrow.   Prior therapy:  Revlimid and dexamethasone started on January 28 2016. Revlimid at 25 mg daily for 21 days with dexamethasone at 20 mg weekly. Therapy discontinued in January 2018 after achieving a complete response.  He developed relapsed disease in April 2020 with M spike of 3.9 g/dL IgM level of 4707.  Current therapy:   Revlimid 15 mg daily for 21 days out of a 28 day cycle with dexamethasone 40 mg weekly started in May 2020.  He achieved a complete response to therapy in May 2021.  Revlimid 5 mg daily 3 weeks on 1 week off started in May 2021.  Therapy discontinued in June 2021.  Revlimid 15 mg daily with dexamethasone restarted in June 2021.   Interim History:  [name] returns today for a follow-up visit.  Since the last visit, he reports no major changes in his health.  He continues to have chronic pain issues predominantly in the hip and ribs after he sustained a fall on the ice recently.  He is still ambulating without any help of a walker or cane and has not reported any hospitalizations.  He continues to tolerate Revlimid without any recent complaints.  Denies any excessive fatigue or tiredness.  He denies any pathological fractures.                 .    Medications: Reviewed without changes.  Current Outpatient Medications  Medication Sig Dispense Refill  . albuterol (VENTOLIN HFA) 108 (90 Base) MCG/ACT inhaler Inhale 2 puffs into the lungs every 4 (four) hours as needed for shortness of breath.    Marland Kitchen amLODipine (NORVASC) 10 MG tablet Take 10 mg by mouth every morning.     Marland Kitchen aspirin-sod bicarb-citric acid (ALKA-SELTZER) 325 MG TBEF  tablet Take 650 mg by mouth every 6 (six) hours as needed (indigestion / headaches).    . bismuth subsalicylate (PEPTO BISMOL) 262 MG/15ML suspension Take 30 mLs by mouth every 6 (six) hours as needed for indigestion or diarrhea or loose stools.    . budesonide (PULMICORT) 0.5 MG/2ML nebulizer solution Take 0.5 mg by nebulization 2 (two) times daily.    Marland Kitchen CLENPIQ 10-3.5-12 MG-GM -GM/160ML SOLN     . colestipol (COLESTID) 1 g tablet     . dexamethasone (DECADRON) 4 MG tablet TAKE 5 TABLETS BY MOUTH ONCE A WEEK 64 tablet 0  . diphenhydrAMINE HCl (ZZZQUIL) 50 MG/30ML LIQD Take 30 mLs by mouth at bedtime as needed (sleep).    . diphenoxylate-atropine (LOMOTIL) 2.5-0.025 MG tablet TAKE 1 TABLET BY MOUTH TWICE DAILY AS NEEDED FOR  DIARRHEA  OR  LOOSE  STOOLS 30 tablet 0  . ferrous sulfate 325 (65 FE) MG tablet Take 325 mg by mouth daily.     . finasteride (PROPECIA) 1 MG tablet     . finasteride (PROSCAR) 5 MG tablet Take 5 mg by mouth daily.    . fluconazole (DIFLUCAN) 150 MG tablet Take 150 mg by mouth daily as needed (tickle in throat caused by yeast).    . gabapentin (NEURONTIN) 400 MG capsule Take 400 mg by mouth 3 (three) times daily.     Marland Kitchen glimepiride (AMARYL) 2 MG tablet  Take 2 mg by mouth daily.    Marland Kitchen HYDROcodone-acetaminophen (NORCO) 10-325 MG tablet Take 1 tablet by mouth 4 (four) times daily.     Marland Kitchen HYSINGLA ER 60 MG T24A Take 60 mg by mouth daily.     Marland Kitchen ipratropium-albuterol (DUONEB) 0.5-2.5 (3) MG/3ML SOLN Take 3 mLs by nebulization 3 (three) times daily.    Marland Kitchen lenalidomide (REVLIMID) 15 MG capsule TAKE 1 CAPSULE BY MOUTH EVERY DAY FOR 21 DAYS ON THEN 7 DAYS OFF. REPEAT EVERY 28 DAYS 21 capsule 0  . loperamide (IMODIUM A-D) 2 MG tablet Take 4 mg by mouth 4 (four) times daily as needed for diarrhea or loose stools.    Marland Kitchen losartan (COZAAR) 50 MG tablet Take 50 mg by mouth every morning.     . metFORMIN (GLUCOPHAGE-XR) 500 MG 24 hr tablet Take 500-1,000 mg by mouth See admin instructions. Take  500 mg in the morning and 1000 mg in the evening    . omeprazole (PRILOSEC) 20 MG capsule Take 20 mg by mouth 2 (two) times daily before a meal.     . Oxycodone HCl 20 MG TABS Take by mouth.    . potassium chloride (KLOR-CON) 10 MEQ tablet     . RESTASIS 0.05 % ophthalmic emulsion Place 1 drop into both eyes 2 (two) times daily as needed for dry eyes.    . Tamsulosin HCl (FLOMAX) 0.4 MG CAPS Take 0.4 mg by mouth 2 (two) times daily.     Marland Kitchen tiZANidine (ZANAFLEX) 4 MG tablet Take 4 mg by mouth 3 (three) times daily.    . traMADol (ULTRAM) 50 MG tablet Take 50 mg by mouth 2 (two) times daily.      No current facility-administered medications for this visit.    Allergies:  Allergies  Allergen Reactions  . Invokana [Canagliflozin] Anaphylaxis    Facial and lip swelling      Physical Exam:          Blood pressure 139/63, pulse 90, temperature 98.9 F (37.2 C), temperature source Tympanic, resp. rate 20, height $RemoveBe'6\' 1"'PLPPJnfnF$  (1.854 m), weight 188 lb 6.4 oz (85.5 kg), SpO2 98 %.      ECOG: 1     General appearance: Comfortable appearing without any discomfort Head: Normocephalic without any trauma Oropharynx: Mucous membranes are moist and pink without any thrush or ulcers. Eyes: Pupils are equal and round reactive to light. Lymph nodes: No cervical, supraclavicular, inguinal or axillary lymphadenopathy.   Heart:regular rate and rhythm.  S1 and S2 without leg edema. Lung: Clear without any rhonchi or wheezes.  No dullness to percussion. Abdomin: Soft, nontender, nondistended with good bowel sounds.  No hepatosplenomegaly. Musculoskeletal: No joint deformity or effusion.  Full range of motion noted. Neurological: No deficits noted on motor, sensory and deep tendon reflex exam. Skin: No petechial rash or dryness.  Appeared moist.        .      Lab Results: Lab Results  Component Value Date   WBC 10.8 (H) 09/02/2020   HGB 12.9 (L) 09/02/2020   HCT 37.6 (L) 09/02/2020    MCV 88.5 09/02/2020   PLT 257 09/02/2020     Chemistry      Component Value Date/Time   NA 136 09/02/2020 1136   NA 138 06/26/2017 0946   K 4.2 09/02/2020 1136   K 3.8 06/26/2017 0946   CL 100 09/02/2020 1136   CL 105 08/09/2012 0918   CO2 24 09/02/2020 1136   CO2 25  06/26/2017 0946   BUN 25 (H) 09/02/2020 1136   BUN 16.4 06/26/2017 0946   CREATININE 1.38 (H) 09/02/2020 1136   CREATININE 1.2 06/26/2017 0946      Component Value Date/Time   CALCIUM 9.7 09/02/2020 1136   CALCIUM 9.3 06/26/2017 0946   ALKPHOS 86 09/02/2020 1136   ALKPHOS 75 06/26/2017 0946   AST 8 (L) 09/02/2020 1136   AST 11 06/26/2017 0946   ALT 15 09/02/2020 1136   ALT 14 06/26/2017 0946   BILITOT 0.3 09/02/2020 1136   BILITOT 0.31 06/26/2017 0946        Results for James [name], James [name] (MRN [id number]) as of 09/09/2020 15:38  Ref. Range 06/29/2020 10:51 09/02/2020 11:36 09/02/2020 11:38  M Protein SerPl Elph-Mcnc Latest Ref Range: Not Observed g/dL Not Observed  Not Observed  IFE 1 Unknown Comment  Comment  Globulin, Total Latest Ref Range: 2.2 - 3.9 g/dL 2.6  2.7  B-Globulin SerPl Elph-Mcnc Latest Ref Range: 0.7 - 1.3 g/dL 1.0  1.1  IgG (Immunoglobin G), Serum Latest Ref Range: 603 - 1,613 mg/dL 576 (L)  531 (L)  IgM (Immunoglobulin M), Srm Latest Ref Range: 15 - 143 mg/dL 16  12 (L)  IgA Latest Ref Range: 61 - 437 mg/dL 181  174        Impression and Plan:  75 year old with:  1.   Multiple myeloma diagnosed in 2017.  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   His disease status was updated at this time including laboratory testing obtained on February 3.  His protein studies showed normalization of his IgA level and no M spike detected.  Risks and benefits of continuing this treatment were discussed at this time.  Potential complications including cytopenias, neuropathy, thrombosis, illness.  Alternative treatment options as well as future treatment choices including daratumumab  with Velcade-based therapy.  He is agreeable to continue at this time.  2.  Chronic pain: Related to arthritis as well as multiple myeloma.  Pain is manageable.  3.  Anemia: His hemoglobin has normalized related to correcting his plasma cell disorder.  We will continue to monitor.  4.  Hypokalemia: No replacement is needed with potassium is adequate at this time.    5.  Follow-up: He will return in 8 weeks for repeat evaluation.  30  minutes were spent on this visit.  The time was dedicated to reviewing his disease status, laboratory data review and outlining future plan of care.   Zola Button, MD 2/10/20223:37 PM

## 2020-09-13 ENCOUNTER — Other Ambulatory Visit: Payer: Self-pay | Admitting: Oncology

## 2020-09-13 DIAGNOSIS — C9001 Multiple myeloma in remission: Secondary | ICD-10-CM

## 2020-09-14 ENCOUNTER — Other Ambulatory Visit: Payer: Self-pay | Admitting: Oncology

## 2020-09-15 ENCOUNTER — Other Ambulatory Visit: Payer: Self-pay

## 2020-09-15 DIAGNOSIS — C9001 Multiple myeloma in remission: Secondary | ICD-10-CM

## 2020-09-15 MED ORDER — LENALIDOMIDE 15 MG PO CAPS: ORAL_CAPSULE | ORAL | 0 refills | Status: DC

## 2020-09-17 ENCOUNTER — Other Ambulatory Visit: Payer: Self-pay | Admitting: *Deleted

## 2020-09-17 DIAGNOSIS — C9001 Multiple myeloma in remission: Secondary | ICD-10-CM

## 2020-09-17 MED ORDER — LENALIDOMIDE 15 MG PO CAPS: ORAL_CAPSULE | ORAL | 0 refills | Status: DC

## 2020-09-27 ENCOUNTER — Telehealth: Payer: Self-pay | Admitting: *Deleted

## 2020-09-27 DIAGNOSIS — J449 Chronic obstructive pulmonary disease, unspecified: Secondary | ICD-10-CM | POA: Diagnosis not present

## 2020-09-27 DIAGNOSIS — J441 Chronic obstructive pulmonary disease with (acute) exacerbation: Secondary | ICD-10-CM | POA: Diagnosis not present

## 2020-09-27 NOTE — Telephone Encounter (Signed)
Spoke with James [name]. He spoke with PCP who told him to take 1/2 dose of his amlodipine.

## 2020-09-27 NOTE — Telephone Encounter (Signed)
James [name] left a message stating his ankles have been swollen for 1 week. " I have soaked them in epsom salt"  RN attempted to call back, no answer.

## 2020-09-28 DIAGNOSIS — R609 Edema, unspecified: Secondary | ICD-10-CM | POA: Diagnosis not present

## 2020-09-28 DIAGNOSIS — J432 Centrilobular emphysema: Secondary | ICD-10-CM | POA: Diagnosis not present

## 2020-10-06 DIAGNOSIS — I10 Essential (primary) hypertension: Secondary | ICD-10-CM | POA: Diagnosis not present

## 2020-10-06 DIAGNOSIS — R600 Localized edema: Secondary | ICD-10-CM | POA: Diagnosis not present

## 2020-10-06 DIAGNOSIS — J432 Centrilobular emphysema: Secondary | ICD-10-CM | POA: Diagnosis not present

## 2020-10-07 ENCOUNTER — Other Ambulatory Visit: Payer: Self-pay | Admitting: Oncology

## 2020-10-11 ENCOUNTER — Other Ambulatory Visit: Payer: Self-pay | Admitting: *Deleted

## 2020-10-11 DIAGNOSIS — C9001 Multiple myeloma in remission: Secondary | ICD-10-CM

## 2020-10-11 MED ORDER — LENALIDOMIDE 15 MG PO CAPS: ORAL_CAPSULE | ORAL | 0 refills | Status: DC

## 2020-10-14 DIAGNOSIS — E1165 Type 2 diabetes mellitus with hyperglycemia: Secondary | ICD-10-CM | POA: Diagnosis not present

## 2020-10-26 DIAGNOSIS — J441 Chronic obstructive pulmonary disease with (acute) exacerbation: Secondary | ICD-10-CM | POA: Diagnosis not present

## 2020-10-26 DIAGNOSIS — J449 Chronic obstructive pulmonary disease, unspecified: Secondary | ICD-10-CM | POA: Diagnosis not present

## 2020-11-05 ENCOUNTER — Other Ambulatory Visit: Payer: Self-pay | Admitting: Oncology

## 2020-11-05 DIAGNOSIS — C9001 Multiple myeloma in remission: Secondary | ICD-10-CM

## 2020-11-09 ENCOUNTER — Other Ambulatory Visit: Payer: Self-pay | Admitting: *Deleted

## 2020-11-16 DIAGNOSIS — N281 Cyst of kidney, acquired: Secondary | ICD-10-CM | POA: Diagnosis not present

## 2020-11-16 DIAGNOSIS — E1165 Type 2 diabetes mellitus with hyperglycemia: Secondary | ICD-10-CM | POA: Diagnosis not present

## 2020-11-16 DIAGNOSIS — N5201 Erectile dysfunction due to arterial insufficiency: Secondary | ICD-10-CM | POA: Diagnosis not present

## 2020-11-16 DIAGNOSIS — E785 Hyperlipidemia, unspecified: Secondary | ICD-10-CM | POA: Diagnosis not present

## 2020-11-16 DIAGNOSIS — I10 Essential (primary) hypertension: Secondary | ICD-10-CM | POA: Diagnosis not present

## 2020-11-16 DIAGNOSIS — Z87891 Personal history of nicotine dependence: Secondary | ICD-10-CM | POA: Diagnosis not present

## 2020-11-16 DIAGNOSIS — Z6826 Body mass index (BMI) 26.0-26.9, adult: Secondary | ICD-10-CM | POA: Diagnosis not present

## 2020-11-16 DIAGNOSIS — R350 Frequency of micturition: Secondary | ICD-10-CM | POA: Diagnosis not present

## 2020-11-16 DIAGNOSIS — R310 Gross hematuria: Secondary | ICD-10-CM | POA: Diagnosis not present

## 2020-11-16 DIAGNOSIS — C9002 Multiple myeloma in relapse: Secondary | ICD-10-CM | POA: Diagnosis not present

## 2020-12-04 ENCOUNTER — Other Ambulatory Visit: Payer: Self-pay | Admitting: Oncology

## 2020-12-04 DIAGNOSIS — C9001 Multiple myeloma in remission: Secondary | ICD-10-CM

## 2020-12-08 ENCOUNTER — Other Ambulatory Visit: Payer: Self-pay

## 2020-12-08 ENCOUNTER — Inpatient Hospital Stay: Payer: Medicare HMO | Attending: Oncology

## 2020-12-08 ENCOUNTER — Other Ambulatory Visit: Payer: Self-pay | Admitting: *Deleted

## 2020-12-08 DIAGNOSIS — C9002 Multiple myeloma in relapse: Secondary | ICD-10-CM | POA: Diagnosis not present

## 2020-12-08 DIAGNOSIS — Z79899 Other long term (current) drug therapy: Secondary | ICD-10-CM | POA: Diagnosis not present

## 2020-12-08 DIAGNOSIS — M1990 Unspecified osteoarthritis, unspecified site: Secondary | ICD-10-CM | POA: Insufficient documentation

## 2020-12-08 DIAGNOSIS — C9001 Multiple myeloma in remission: Secondary | ICD-10-CM

## 2020-12-08 LAB — CBC WITH DIFFERENTIAL (CANCER CENTER ONLY)
Abs Immature Granulocytes: 0.05 10*3/uL (ref 0.00–0.07)
Basophils Absolute: 0 10*3/uL (ref 0.0–0.1)
Basophils Relative: 0 %
Eosinophils Absolute: 0.1 10*3/uL (ref 0.0–0.5)
Eosinophils Relative: 1 %
HCT: 36.8 % — ABNORMAL LOW (ref 39.0–52.0)
Hemoglobin: 12.3 g/dL — ABNORMAL LOW (ref 13.0–17.0)
Immature Granulocytes: 1 %
Lymphocytes Relative: 9 %
Lymphs Abs: 1 10*3/uL (ref 0.7–4.0)
MCH: 31 pg (ref 26.0–34.0)
MCHC: 33.4 g/dL (ref 30.0–36.0)
MCV: 92.7 fL (ref 80.0–100.0)
Monocytes Absolute: 0.4 10*3/uL (ref 0.1–1.0)
Monocytes Relative: 3 %
Neutro Abs: 9.6 10*3/uL — ABNORMAL HIGH (ref 1.7–7.7)
Neutrophils Relative %: 86 %
Platelet Count: 232 10*3/uL (ref 150–400)
RBC: 3.97 MIL/uL — ABNORMAL LOW (ref 4.22–5.81)
RDW: 15.2 % (ref 11.5–15.5)
WBC Count: 11.1 10*3/uL — ABNORMAL HIGH (ref 4.0–10.5)
nRBC: 0 % (ref 0.0–0.2)

## 2020-12-08 LAB — CMP (CANCER CENTER ONLY)
ALT: 12 U/L (ref 0–44)
AST: 12 U/L — ABNORMAL LOW (ref 15–41)
Albumin: 3.8 g/dL (ref 3.5–5.0)
Alkaline Phosphatase: 77 U/L (ref 38–126)
Anion gap: 14 (ref 5–15)
BUN: 15 mg/dL (ref 8–23)
CO2: 27 mmol/L (ref 22–32)
Calcium: 9.1 mg/dL (ref 8.9–10.3)
Chloride: 96 mmol/L — ABNORMAL LOW (ref 98–111)
Creatinine: 1.35 mg/dL — ABNORMAL HIGH (ref 0.61–1.24)
GFR, Estimated: 54 mL/min — ABNORMAL LOW (ref >60)
Glucose, Bld: 388 mg/dL — ABNORMAL HIGH (ref 70–99)
Potassium: 3.9 mmol/L (ref 3.5–5.1)
Sodium: 137 mmol/L (ref 135–145)
Total Bilirubin: 0.5 mg/dL (ref 0.3–1.2)
Total Protein: 7 g/dL (ref 6.5–8.1)

## 2020-12-09 LAB — KAPPA/LAMBDA LIGHT CHAINS
Kappa free light chain: 23.6 mg/L — ABNORMAL HIGH (ref 3.3–19.4)
Kappa, lambda light chain ratio: 1.53 (ref 0.26–1.65)
Lambda free light chains: 15.4 mg/L (ref 5.7–26.3)

## 2020-12-10 DIAGNOSIS — E118 Type 2 diabetes mellitus with unspecified complications: Secondary | ICD-10-CM | POA: Diagnosis not present

## 2020-12-13 DIAGNOSIS — J383 Other diseases of vocal cords: Secondary | ICD-10-CM | POA: Diagnosis not present

## 2020-12-13 DIAGNOSIS — C9000 Multiple myeloma not having achieved remission: Secondary | ICD-10-CM | POA: Diagnosis not present

## 2020-12-13 DIAGNOSIS — J3489 Other specified disorders of nose and nasal sinuses: Secondary | ICD-10-CM | POA: Diagnosis not present

## 2020-12-13 DIAGNOSIS — H6123 Impacted cerumen, bilateral: Secondary | ICD-10-CM | POA: Diagnosis not present

## 2020-12-13 DIAGNOSIS — J449 Chronic obstructive pulmonary disease, unspecified: Secondary | ICD-10-CM | POA: Diagnosis not present

## 2020-12-13 LAB — MULTIPLE MYELOMA PANEL, SERUM
Albumin SerPl Elph-Mcnc: 3.8 g/dL (ref 2.9–4.4)
Albumin/Glob SerPl: 1.4 (ref 0.7–1.7)
Alpha 1: 0.3 g/dL (ref 0.0–0.4)
Alpha2 Glob SerPl Elph-Mcnc: 1 g/dL (ref 0.4–1.0)
B-Globulin SerPl Elph-Mcnc: 1.1 g/dL (ref 0.7–1.3)
Gamma Glob SerPl Elph-Mcnc: 0.5 g/dL (ref 0.4–1.8)
Globulin, Total: 2.8 g/dL (ref 2.2–3.9)
IgA: 164 mg/dL (ref 61–437)
IgG (Immunoglobin G), Serum: 536 mg/dL — ABNORMAL LOW (ref 603–1613)
IgM (Immunoglobulin M), Srm: 11 mg/dL — ABNORMAL LOW (ref 15–143)
Total Protein ELP: 6.6 g/dL (ref 6.0–8.5)

## 2020-12-15 ENCOUNTER — Inpatient Hospital Stay (HOSPITAL_BASED_OUTPATIENT_CLINIC_OR_DEPARTMENT_OTHER): Payer: Medicare HMO | Admitting: Oncology

## 2020-12-15 ENCOUNTER — Other Ambulatory Visit: Payer: Self-pay

## 2020-12-15 VITALS — BP 115/57 | HR 73 | Temp 97.8°F | Resp 18 | Ht 73.0 in | Wt 177.3 lb

## 2020-12-15 DIAGNOSIS — M48062 Spinal stenosis, lumbar region with neurogenic claudication: Secondary | ICD-10-CM | POA: Diagnosis not present

## 2020-12-15 DIAGNOSIS — C9001 Multiple myeloma in remission: Secondary | ICD-10-CM | POA: Diagnosis not present

## 2020-12-15 DIAGNOSIS — Z79899 Other long term (current) drug therapy: Secondary | ICD-10-CM | POA: Diagnosis not present

## 2020-12-15 DIAGNOSIS — M1990 Unspecified osteoarthritis, unspecified site: Secondary | ICD-10-CM | POA: Diagnosis not present

## 2020-12-15 DIAGNOSIS — C9002 Multiple myeloma in relapse: Secondary | ICD-10-CM | POA: Diagnosis not present

## 2020-12-15 NOTE — Progress Notes (Signed)
Hematology and Oncology Follow Up Visit  James [name] [id number] 1945/11/12 75 y.o. 12/15/2020 10:30 AM James [name], MDRamachandran, [name], MD   Principle Diagnosis: 75 year old man with IgA kappa multiple myeloma diagnosed in 2017.  He was found to have 30% plasma cells involvement in the bone marrow.   Prior therapy:  Revlimid and dexamethasone started on January 28 2016. Revlimid at 25 mg daily for 21 days with dexamethasone at 20 mg weekly. Therapy discontinued in January 2018 after achieving a complete response.  He developed relapsed disease in April 2020 with M spike of 3.9 g/dL IgM level of 4707.  Current therapy:   Revlimid 15 mg daily for 21 days out of a 28 day cycle with dexamethasone 40 mg weekly started in May 2020.  He achieved a complete response to therapy in May 2021.  Revlimid 5 mg daily 3 weeks on 1 week off started in May 2021.  Therapy discontinued in June 2021.  Revlimid 15 mg daily with dexamethasone restarted in June 2021.   Interim History:  James [name] is here for return evaluation.  Since last visit, he reports no major changes in his health.  He continues to tolerate Revlimid without any major complications.  He denies any nausea, vomiting or worsening bone pain.  He is ambulating without any major difficulties but does report some occasional unsteadiness but no falls or syncope.  He does not use a cane at this time but has used it in the past.  His appetite remained reasonable and weight is maintained.                 .    Medications: Updated on review.   Current Outpatient Medications  Medication Sig Dispense Refill  . albuterol (VENTOLIN HFA) 108 (90 Base) MCG/ACT inhaler Inhale 2 puffs into the lungs every 4 (four) hours as needed for shortness of breath.    Marland Kitchen amLODipine (NORVASC) 10 MG tablet Take 10 mg by mouth every morning.     Marland Kitchen aspirin-sod bicarb-citric acid (ALKA-SELTZER) 325 MG TBEF tablet Take 650 mg by mouth  every 6 (six) hours as needed (indigestion / headaches).    . bismuth subsalicylate (PEPTO BISMOL) 262 MG/15ML suspension Take 30 mLs by mouth every 6 (six) hours as needed for indigestion or diarrhea or loose stools.    . budesonide (PULMICORT) 0.5 MG/2ML nebulizer solution Take 0.5 mg by nebulization 2 (two) times daily.    Marland Kitchen CLENPIQ 10-3.5-12 MG-GM -GM/160ML SOLN     . colestipol (COLESTID) 1 g tablet     . dexamethasone (DECADRON) 4 MG tablet TAKE 5 TABLETS BY MOUTH ONCE A WEEK 64 tablet 0  . diphenhydrAMINE HCl (ZZZQUIL) 50 MG/30ML LIQD Take 30 mLs by mouth at bedtime as needed (sleep).    . diphenoxylate-atropine (LOMOTIL) 2.5-0.025 MG tablet TAKE 1 TABLET BY MOUTH TWICE DAILY AS NEEDED FOR  DIARRHEA  OR  LOOSE  STOOLS 30 tablet 0  . ferrous sulfate 325 (65 FE) MG tablet Take 325 mg by mouth daily.     . finasteride (PROPECIA) 1 MG tablet     . finasteride (PROSCAR) 5 MG tablet Take 5 mg by mouth daily.    . fluconazole (DIFLUCAN) 150 MG tablet Take 150 mg by mouth daily as needed (tickle in throat caused by yeast).    . gabapentin (NEURONTIN) 400 MG capsule Take 400 mg by mouth 3 (three) times daily.     Marland Kitchen glimepiride (AMARYL) 2 MG tablet Take 2  mg by mouth daily.    Marland Kitchen HYDROcodone-acetaminophen (NORCO) 10-325 MG tablet Take 1 tablet by mouth 4 (four) times daily.     Marland Kitchen HYSINGLA ER 60 MG T24A Take 60 mg by mouth daily.     Marland Kitchen ipratropium-albuterol (DUONEB) 0.5-2.5 (3) MG/3ML SOLN Take 3 mLs by nebulization 3 (three) times daily.    Marland Kitchen lenalidomide (REVLIMID) 15 MG capsule TAKE 1 CAPSULE BY MOUTH EVERY DAY FOR 21 DAYS ON THEN 7 DAYS OFF. REPEAT EVERY 28 DAYS 21 capsule 0  . loperamide (IMODIUM A-D) 2 MG tablet Take 4 mg by mouth 4 (four) times daily as needed for diarrhea or loose stools.    Marland Kitchen losartan (COZAAR) 50 MG tablet Take 50 mg by mouth every morning.     . metFORMIN (GLUCOPHAGE-XR) 500 MG 24 hr tablet Take 500-1,000 mg by mouth See admin instructions. Take 500 mg in the morning and  1000 mg in the evening    . omeprazole (PRILOSEC) 20 MG capsule Take 20 mg by mouth 2 (two) times daily before a meal.     . Oxycodone HCl 20 MG TABS Take by mouth.    . potassium chloride (KLOR-CON) 10 MEQ tablet     . potassium chloride SA (KLOR-CON) 20 MEQ tablet Take 1 tablet by mouth once daily 30 tablet 0  . RESTASIS 0.05 % ophthalmic emulsion Place 1 drop into both eyes 2 (two) times daily as needed for dry eyes.    . Tamsulosin HCl (FLOMAX) 0.4 MG CAPS Take 0.4 mg by mouth 2 (two) times daily.     Marland Kitchen tiZANidine (ZANAFLEX) 4 MG tablet Take 4 mg by mouth 3 (three) times daily.    . traMADol (ULTRAM) 50 MG tablet Take 50 mg by mouth 2 (two) times daily.      No current facility-administered medications for this visit.    Allergies:  Allergies  Allergen Reactions  . Invokana [Canagliflozin] Anaphylaxis    Facial and lip swelling      Physical Exam:         Blood pressure (!) 115/57, pulse 73, temperature 97.8 F (36.6 C), temperature source Tympanic, resp. rate 18, height $RemoveBe'6\' 1"'[id number]$  (1.854 m), weight 177 lb 4.8 oz (80.4 kg), SpO2 99 %.        ECOG: 1    General appearance: Alert, awake without any distress. Head: Atraumatic without abnormalities Oropharynx: Without any thrush or ulcers. Eyes: No scleral icterus. Lymph nodes: No lymphadenopathy noted in the cervical, supraclavicular, or axillary nodes Heart:regular rate and rhythm, without any murmurs or gallops.   Lung: Clear to auscultation without any rhonchi, wheezes or dullness to percussion. Abdomin: Soft, nontender without any shifting dullness or ascites. Musculoskeletal: No clubbing or cyanosis. Neurological: No motor or sensory deficits. Skin: No rashes or lesions.        .      Lab Results: Lab Results  Component Value Date   WBC 11.1 (H) 12/08/2020   HGB 12.3 (L) 12/08/2020   HCT 36.8 (L) 12/08/2020   MCV 92.7 12/08/2020   PLT 232 12/08/2020     Chemistry      Component Value  Date/Time   NA 137 12/08/2020 1010   NA 138 06/26/2017 0946   K 3.9 12/08/2020 1010   K 3.8 06/26/2017 0946   CL 96 (L) 12/08/2020 1010   CL 105 08/09/2012 0918   CO2 27 12/08/2020 1010   CO2 25 06/26/2017 0946   BUN 15 12/08/2020 1010   BUN  16.4 06/26/2017 0946   CREATININE 1.35 (H) 12/08/2020 1010   CREATININE 1.2 06/26/2017 0946      Component Value Date/Time   CALCIUM 9.1 12/08/2020 1010   CALCIUM 9.3 06/26/2017 0946   ALKPHOS 77 12/08/2020 1010   ALKPHOS 75 06/26/2017 0946   AST 12 (L) 12/08/2020 1010   AST 11 06/26/2017 0946   ALT 12 12/08/2020 1010   ALT 14 06/26/2017 0946   BILITOT 0.5 12/08/2020 1010   BILITOT 0.31 06/26/2017 0946       Results for Wilhelmsen, SEQUOIA MINCEY (MRN [id number]) as of 12/15/2020 10:31  Ref. Range 09/02/2020 11:38 12/08/2020 10:11  M Protein SerPl Elph-Mcnc Latest Ref Range: Not Observed g/dL Not Observed Not Observed  IFE 1 Unknown Comment Comment  Globulin, Total Latest Ref Range: 2.2 - 3.9 g/dL 2.7 2.8  B-Globulin SerPl Elph-Mcnc Latest Ref Range: 0.7 - 1.3 g/dL 1.1 1.1  IgG (Immunoglobin G), Serum Latest Ref Range: 603 - 1,613 mg/dL 531 (L) 536 (L)  IgM (Immunoglobulin M), Srm Latest Ref Range: 15 - 143 mg/dL 12 (L) 11 (L)  IgA Latest Ref Range: 61 - 437 mg/dL 174 164     Results for BILLEY, WOJCIAK (MRN [id number]) as of 12/15/2020 10:31  Ref. Range 12/08/2020 10:10  Kappa free light chain Latest Ref Range: 3.3 - 19.4 mg/L 23.6 (H)  Lamda free light chains Latest Ref Range: 5.7 - 26.3 mg/L 15.4  Kappa, lamda light chain ratio Latest Ref Range: 0.26 - 1.65  1.53      Impression and Plan:  75 year old with:  1.   IgA kappa multiple myeloma diagnosed in 2017.    He continues to be on Revlimid with the excellent disease control and response at this time.  Protein studies on Dec 08, 2020 were reviewed and showed no M spike detected with normalization of his kappa to lambda ratio.  Risks and benefits of continuing Revlimid and  dexamethasone long-term were reviewed.  Potential complications including bone marrow disease, cytopenia, GI toxicity and dermatological toxicity were reiterated.  He prefers to continue on treatment as he has done poorly off treatment overall.  He is agreeable to continue with salvage therapy options deferred unless he develops relapsed disease.  He is to include daratumumab, Kyprolis among other options.   2.  Chronic pain: Multifactorial in nature related to osteoarthritis as well as myeloma disease involvement from the bone.  Pain is currently manageable.  3.  Anemia: Hemoglobin on May 11 was 12.3 and overall stable without any need for intervention or growth factor support.  4.  Hypokalemia: Potassium is within normal range on May 11 and no intervention is needed.    5.  Follow-up: In 3 months for repeat follow-up.  30  minutes were dedicated to this encounter.  The time was spent on reviewing laboratory data, disease status update and treatment options for the future.   Zola Button, MD 5/18/202210:30 AM

## 2020-12-16 ENCOUNTER — Telehealth: Payer: Self-pay | Admitting: Oncology

## 2020-12-16 DIAGNOSIS — Z0000 Encounter for general adult medical examination without abnormal findings: Secondary | ICD-10-CM | POA: Diagnosis not present

## 2020-12-16 DIAGNOSIS — E785 Hyperlipidemia, unspecified: Secondary | ICD-10-CM | POA: Diagnosis not present

## 2020-12-16 DIAGNOSIS — Z125 Encounter for screening for malignant neoplasm of prostate: Secondary | ICD-10-CM | POA: Diagnosis not present

## 2020-12-16 DIAGNOSIS — E1165 Type 2 diabetes mellitus with hyperglycemia: Secondary | ICD-10-CM | POA: Diagnosis not present

## 2020-12-16 DIAGNOSIS — I10 Essential (primary) hypertension: Secondary | ICD-10-CM | POA: Diagnosis not present

## 2020-12-16 DIAGNOSIS — R39198 Other difficulties with micturition: Secondary | ICD-10-CM | POA: Diagnosis not present

## 2020-12-16 NOTE — Telephone Encounter (Signed)
Per 5/18  [place], Pt requested calendar, calendar mailed

## 2020-12-17 DIAGNOSIS — J387 Other diseases of larynx: Secondary | ICD-10-CM | POA: Diagnosis not present

## 2020-12-17 DIAGNOSIS — R490 Dysphonia: Secondary | ICD-10-CM | POA: Diagnosis not present

## 2020-12-20 ENCOUNTER — Other Ambulatory Visit: Payer: Self-pay | Admitting: Otolaryngology

## 2020-12-20 DIAGNOSIS — C9000 Multiple myeloma not having achieved remission: Secondary | ICD-10-CM | POA: Diagnosis not present

## 2020-12-20 DIAGNOSIS — G894 Chronic pain syndrome: Secondary | ICD-10-CM | POA: Diagnosis not present

## 2020-12-20 DIAGNOSIS — M48061 Spinal stenosis, lumbar region without neurogenic claudication: Secondary | ICD-10-CM | POA: Diagnosis not present

## 2020-12-20 DIAGNOSIS — S2243XG Multiple fractures of ribs, bilateral, subsequent encounter for fracture with delayed healing: Secondary | ICD-10-CM | POA: Diagnosis not present

## 2020-12-20 DIAGNOSIS — M5136 Other intervertebral disc degeneration, lumbar region: Secondary | ICD-10-CM | POA: Diagnosis not present

## 2020-12-23 DIAGNOSIS — K2100 Gastro-esophageal reflux disease with esophagitis, without bleeding: Secondary | ICD-10-CM | POA: Diagnosis not present

## 2020-12-23 DIAGNOSIS — Z794 Long term (current) use of insulin: Secondary | ICD-10-CM | POA: Diagnosis not present

## 2020-12-23 DIAGNOSIS — C9002 Multiple myeloma in relapse: Secondary | ICD-10-CM | POA: Diagnosis not present

## 2020-12-23 DIAGNOSIS — J432 Centrilobular emphysema: Secondary | ICD-10-CM | POA: Diagnosis not present

## 2020-12-23 DIAGNOSIS — E785 Hyperlipidemia, unspecified: Secondary | ICD-10-CM | POA: Diagnosis not present

## 2020-12-23 DIAGNOSIS — Z0000 Encounter for general adult medical examination without abnormal findings: Secondary | ICD-10-CM | POA: Diagnosis not present

## 2020-12-23 DIAGNOSIS — I10 Essential (primary) hypertension: Secondary | ICD-10-CM | POA: Diagnosis not present

## 2020-12-23 DIAGNOSIS — E1165 Type 2 diabetes mellitus with hyperglycemia: Secondary | ICD-10-CM | POA: Diagnosis not present

## 2020-12-23 DIAGNOSIS — R600 Localized edema: Secondary | ICD-10-CM | POA: Diagnosis not present

## 2020-12-30 ENCOUNTER — Other Ambulatory Visit: Payer: Self-pay | Admitting: Oncology

## 2020-12-30 DIAGNOSIS — C9001 Multiple myeloma in remission: Secondary | ICD-10-CM

## 2021-01-05 DIAGNOSIS — E1165 Type 2 diabetes mellitus with hyperglycemia: Secondary | ICD-10-CM | POA: Diagnosis not present

## 2021-01-05 DIAGNOSIS — Z87891 Personal history of nicotine dependence: Secondary | ICD-10-CM | POA: Diagnosis not present

## 2021-01-05 DIAGNOSIS — Z6826 Body mass index (BMI) 26.0-26.9, adult: Secondary | ICD-10-CM | POA: Diagnosis not present

## 2021-01-05 DIAGNOSIS — I10 Essential (primary) hypertension: Secondary | ICD-10-CM | POA: Diagnosis not present

## 2021-01-05 DIAGNOSIS — C9002 Multiple myeloma in relapse: Secondary | ICD-10-CM | POA: Diagnosis not present

## 2021-01-05 DIAGNOSIS — E785 Hyperlipidemia, unspecified: Secondary | ICD-10-CM | POA: Diagnosis not present

## 2021-01-07 ENCOUNTER — Other Ambulatory Visit: Payer: Self-pay | Admitting: Oncology

## 2021-01-11 ENCOUNTER — Other Ambulatory Visit: Payer: Self-pay | Admitting: *Deleted

## 2021-01-11 DIAGNOSIS — C9001 Multiple myeloma in remission: Secondary | ICD-10-CM

## 2021-01-11 MED ORDER — LENALIDOMIDE 15 MG PO CAPS: ORAL_CAPSULE | ORAL | 0 refills | Status: DC

## 2021-01-28 ENCOUNTER — Other Ambulatory Visit: Payer: Self-pay | Admitting: *Deleted

## 2021-01-28 DIAGNOSIS — C9001 Multiple myeloma in remission: Secondary | ICD-10-CM

## 2021-01-28 MED ORDER — LENALIDOMIDE 15 MG PO CAPS: ORAL_CAPSULE | ORAL | 0 refills | Status: DC

## 2021-01-29 ENCOUNTER — Other Ambulatory Visit: Payer: Self-pay | Admitting: Oncology

## 2021-01-29 DIAGNOSIS — C9001 Multiple myeloma in remission: Secondary | ICD-10-CM

## 2021-02-01 ENCOUNTER — Other Ambulatory Visit: Payer: Self-pay

## 2021-02-01 ENCOUNTER — Ambulatory Visit (HOSPITAL_COMMUNITY)
Admission: EM | Admit: 2021-02-01 | Discharge: 2021-02-01 | Disposition: A | Discharge status: Home / Self Care | Payer: Medicare HMO

## 2021-02-01 ENCOUNTER — Encounter (HOSPITAL_COMMUNITY): Payer: Self-pay

## 2021-02-01 DIAGNOSIS — Z760 Encounter for issue of repeat prescription: Secondary | ICD-10-CM | POA: Diagnosis not present

## 2021-02-01 NOTE — Discharge Instructions (Addendum)
[facility]- [phone]   Call and get set up with a primary care provider as soon as possible.

## 2021-02-01 NOTE — ED Provider Notes (Signed)
[facility]    CSN: [id number] Arrival date & time: 02/01/21  1028      History   Chief Complaint Chief Complaint  Patient presents with   Medication Refill    HPI James [name]. is a 75 y.o. male.   Patient here requesting Evaristo Bury.  Reports that PCP gave him that proceed with the Evaristo Bury a few weeks ago and he has been taking it since however medication was not actually prescribed to patient.  Also reports patient was dismissed from practice and they were unable to assist him when he called requesting a refill this morning.  Reports that PCP office told patient to come to urgent care for medication refill.  Reports significant past medical history including cancer for which he is undergoing treatment.  Reports being given a list of providers to try and get follow up appointments but that all of the offices are scheduling several months out.  Denies any trauma, injury, or other precipitating event.  Denies any specific alleviating or aggravating factors.  Denies any fevers, chest pain, shortness of breath, N/V/D, numbness, tingling, weakness, abdominal pain, or headaches.     The history is provided by the patient.  Medication Refill  Past Medical History:  Diagnosis Date   Anemia    Arthritis    BPH (benign prostatic hyperplasia)    Chronic pain    Diabetes mellitus    GERD (gastroesophageal reflux disease)    Headache(784.0)    migraines, sinus headaches   Hepatitis    C and B-treated   HLD (hyperlipidemia)    Hypertension    Pneumonia 07/2011   Sleep apnea 03/2018   going for fitting on 04-29-18   Thyroid disease    goiter   Urinary frequency     Patient Active Problem List   Diagnosis Date Noted   Coronary artery disease involving native coronary artery of native heart without angina pectoris 10/09/2018   RBBB 10/09/2018   Essential hypertension 10/09/2018   Multiple myeloma (HCC) 01/14/2016   Plasma cell disorder     Past Surgical History:   Procedure Laterality Date   ABDOMINAL SURGERY     BACK SURGERY     x 5    BIOPSY  01/02/2020   Procedure: BIOPSY;  Surgeon: Jeani Hawking, MD;  Location: [facility];  Service: Endoscopy;;   CHOLECYSTECTOMY     COLONOSCOPY     COLONOSCOPY WITH PROPOFOL N/A 01/02/2020   Procedure: COLONOSCOPY WITH PROPOFOL;  Surgeon: Jeani Hawking, MD;  Location: [facility];  Service: Endoscopy;  Laterality: N/A;   cyst removal skull  20 years ago   ETHMOIDECTOMY  10/12/2011   Procedure: ETHMOIDECTOMY;  Surgeon: Keturah Barre, MD;  Location: [facility] OR;  Service: ENT;  Laterality: Bilateral;  bilateral maxillary sinus osteal enlargement, frontal sinusotomy   EYE SURGERY     bilat cataract with lens implants   HAND SURGERY     right finger   HERNIA REPAIR     POLYPECTOMY  01/02/2020   Procedure: POLYPECTOMY;  Surgeon: Jeani Hawking, MD;  Location: [facility];  Service: Endoscopy;;   ROTATOR CUFF REPAIR     bilateral   SHOULDER ARTHROSCOPY WITH ROTATOR CUFF REPAIR Left 05/18/2014   Procedure: SHOULDER ARTHROSCOPY WITH ROTATOR CUFF REPAIR;  Surgeon: Mable Paris, MD;  Location: [facility];  Service: Orthopedics;  Laterality: Left;  Left shoulder arthroscopy rotator cuff repair, subacromial decompression   sinus surgery     TONSILLECTOMY  TRIGGER FINGER RELEASE Right 05/02/2018   Procedure: RELEASE TRIGGER FINGER/A-1 PULLEY RIGHT SMALL FINGER;  Surgeon: Leanora Cover, MD;  Location: [place];  Service: Orthopedics;  Laterality: Right;       Home Medications    Prior to Admission medications   Medication Sig Start Date End Date Taking? Authorizing Provider  amLODipine (NORVASC) 10 MG tablet Take 10 mg by mouth every morning.     [provider]  aspirin-sod bicarb-citric acid (ALKA-SELTZER) 325 MG TBEF tablet Take 650 mg by mouth every 6 (six) hours as needed (indigestion / headaches).    [provider]  bismuth subsalicylate (PEPTO BISMOL)  262 MG/15ML suspension Take 30 mLs by mouth every 6 (six) hours as needed for indigestion or diarrhea or loose stools.    [provider]  CLENPIQ 10-3.5-12 MG-GM -GM/160ML SOLN  12/17/19   [provider]  colestipol (COLESTID) 1 g tablet  02/11/20   [provider]  dexamethasone (DECADRON) 4 MG tablet TAKE 5 TABLETS BY MOUTH ONCE A WEEK 01/07/21   Wyatt Portela, MD  diphenhydrAMINE HCl (ZZZQUIL) 50 MG/30ML LIQD Take 30 mLs by mouth at bedtime as needed (sleep).    [provider]  diphenoxylate-atropine (LOMOTIL) 2.5-0.025 MG tablet TAKE 1 TABLET BY MOUTH TWICE DAILY AS NEEDED FOR  DIARRHEA  OR  LOOSE  STOOLS 07/06/20   Wyatt Portela, MD  ferrous sulfate 325 (65 FE) MG tablet Take 325 mg by mouth daily.     [provider]  finasteride (PROPECIA) 1 MG tablet  02/16/20   [provider]  finasteride (PROSCAR) 5 MG tablet Take 5 mg by mouth daily. 12/25/14   [provider]  fluconazole (DIFLUCAN) 150 MG tablet Take 150 mg by mouth daily as needed (tickle in throat caused by yeast).    [provider]  gabapentin (NEURONTIN) 400 MG capsule Take 400 mg by mouth 3 (three) times daily.  08/26/15   [provider]  glimepiride (AMARYL) 2 MG tablet Take 2 mg by mouth daily. 12/06/15   [provider]  hydrochlorothiazide (HYDRODIURIL) 25 MG tablet Take 25 mg by mouth daily. 01/03/21   [provider]  HYDROcodone-acetaminophen (NORCO) 10-325 MG tablet Take 1 tablet by mouth 4 (four) times daily.  09/16/16   [provider]  [facility] ER 60 MG T24A Take 60 mg by mouth daily.  01/27/19   [provider]  ipratropium-albuterol (DUONEB) 0.5-2.5 (3) MG/3ML SOLN Take 3 mLs by nebulization 3 (three) times daily.    [provider]  lenalidomide (REVLIMID) 15 MG capsule TAKE 1 CAPSULE BY MOUTH EVERY DAY FOR 21 DAYS ON THEN 7 DAYS OFF. REPEAT EVERY 28 DAYS 01/28/21   Wyatt Portela, MD  loperamide  (IMODIUM A-D) 2 MG tablet Take 4 mg by mouth 4 (four) times daily as needed for diarrhea or loose stools.    [provider]  losartan (COZAAR) 50 MG tablet Take 50 mg by mouth every morning.     [provider]  omeprazole (PRILOSEC) 20 MG capsule Take 20 mg by mouth 2 (two) times daily before a meal.     [provider]  Oxycodone HCl 20 MG TABS Take by mouth. 01/30/20   [provider]  potassium chloride (KLOR-CON) 10 MEQ tablet  02/21/20   [provider]  potassium chloride SA (KLOR-CON) 20 MEQ tablet Take 1 tablet by mouth once daily 10/07/20   Wyatt Portela, MD  RESTASIS  0.05 % ophthalmic emulsion Place 1 drop into both eyes 2 (two) times daily as needed for dry eyes. 11/27/19   [provider]  Tamsulosin HCl (FLOMAX) 0.4 MG CAPS Take 0.4 mg by mouth 2 (two) times daily.     [provider]  tiZANidine (ZANAFLEX) 4 MG tablet Take 4 mg by mouth 3 (three) times daily. 12/16/19   [provider]  traMADol (ULTRAM) 50 MG tablet Take 50 mg by mouth 2 (two) times daily.     [provider]  terazosin (HYTRIN) 5 MG capsule Take 5 mg by mouth 2 (two) times daily.   10/25/11  [provider]    Family History Family History  Problem Relation Age of Onset   Hyperlipidemia Mother    Hypertension Mother     Social History Social History   Tobacco Use   Smoking status: Former    Pack years: 0.00    Types: Cigarettes    Quit date: 05/12/1984    Years since quitting: 36.7   Smokeless tobacco: Never  Substance Use Topics   Alcohol use: No   Drug use: No     Allergies   Invokana [canagliflozin]   Review of Systems Review of Systems  All other systems reviewed and are negative.   Physical Exam Triage Vital Signs ED Triage Vitals  Enc Vitals Group     BP 02/01/21 1142 (!) 143/61     Pulse Rate 02/01/21 1142 84     Resp 02/01/21 1142 18     Temp 02/01/21 1142 98.9 F (37.2 C)     Temp  Source 02/01/21 1142 Oral     SpO2 02/01/21 1142 100 %     Weight --      Height --      Head Circumference --      Peak Flow --      Pain Score 02/01/21 1138 0     Pain Loc --      Pain Edu? --      Excl. in [place]? --    No data found.  Updated Vital Signs BP (!) 143/61 (BP Location: Right Arm)   Pulse 84   Temp 98.9 F (37.2 C) (Oral)   Resp 18   SpO2 100%   Visual Acuity Right Eye Distance:   Left Eye Distance:   Bilateral Distance:    Right Eye Near:   Left Eye Near:    Bilateral Near:     Physical Exam Vitals and nursing note reviewed.  Constitutional:      General: He is not in acute distress.    Appearance: Normal appearance. He is not ill-appearing, toxic-appearing or diaphoretic.  HENT:     Head: Normocephalic and atraumatic.  Eyes:     Conjunctiva/sclera: Conjunctivae normal.  Cardiovascular:     Rate and Rhythm: Normal rate.     Pulses: Normal pulses.  Pulmonary:     Effort: Pulmonary effort is normal.  Abdominal:     General: Abdomen is flat.  Musculoskeletal:        General: Normal range of motion.     Cervical back: Normal range of motion.  Skin:    General: Skin is warm and dry.  Neurological:     General: No focal deficit present.     Mental Status: He is alert and oriented to person, place, and time.  Psychiatric:        Mood and Affect: Mood normal.     UC Treatments /  Results  Labs (all labs ordered are listed, but only abnormal results are displayed) Labs Reviewed - No data to display  EKG   Radiology No results found.  Procedures Procedures (including critical care time)  Medications Ordered in UC Medications - No data to display  Initial Impression / Assessment and Plan / UC Course  I have reviewed the triage vital signs and the nursing notes.  Pertinent labs & imaging results that were available during my care of the patient were reviewed by me and considered in my medical decision making (see chart for details).     Assessment negative for red flags or concerns.  In reviewing chart and previously prescription, no prescription for Tyler Aas has ever been given.  Patient taking glimepiride for diabetes management.  Patient was encouraged to try and find a new PCP as soon as possible.  Patient expressed frustration that he has called but that they earliest he can get in has been September. An extended amount of time was spent with patient and attempted to assist patient in making an appointment with a new PCP in the next few weeks.  However, patient was unwilling to assist in this process.  Patient was given the phone numbers several primary care offices and told to schedule an appointment as soon as possible.   Final Clinical Impressions(s) / UC Diagnoses   Final diagnoses:  Medication refill     Discharge Instructions      [facility]- [phone]) [phone]   Call and get set up with a primary care provider as soon as possible.       ED Prescriptions   None    PDMP not reviewed this encounter.   Pearson Forster, NP 02/01/21 1257

## 2021-02-01 NOTE — ED Triage Notes (Signed)
Pt states he was taking [place] from his PCP and was wanting to see about refill.

## 2021-02-09 DIAGNOSIS — Z720 Tobacco use: Secondary | ICD-10-CM | POA: Diagnosis not present

## 2021-02-09 DIAGNOSIS — Z131 Encounter for screening for diabetes mellitus: Secondary | ICD-10-CM | POA: Diagnosis not present

## 2021-02-09 DIAGNOSIS — E118 Type 2 diabetes mellitus with unspecified complications: Secondary | ICD-10-CM | POA: Diagnosis not present

## 2021-02-09 DIAGNOSIS — M5136 Other intervertebral disc degeneration, lumbar region: Secondary | ICD-10-CM | POA: Diagnosis not present

## 2021-02-09 DIAGNOSIS — R5383 Other fatigue: Secondary | ICD-10-CM | POA: Diagnosis not present

## 2021-02-09 DIAGNOSIS — E11649 Type 2 diabetes mellitus with hypoglycemia without coma: Secondary | ICD-10-CM | POA: Diagnosis not present

## 2021-02-09 DIAGNOSIS — J449 Chronic obstructive pulmonary disease, unspecified: Secondary | ICD-10-CM | POA: Diagnosis not present

## 2021-02-09 DIAGNOSIS — D649 Anemia, unspecified: Secondary | ICD-10-CM | POA: Diagnosis not present

## 2021-02-09 DIAGNOSIS — E785 Hyperlipidemia, unspecified: Secondary | ICD-10-CM | POA: Diagnosis not present

## 2021-02-09 DIAGNOSIS — I152 Hypertension secondary to endocrine disorders: Secondary | ICD-10-CM | POA: Diagnosis not present

## 2021-02-09 DIAGNOSIS — C9000 Multiple myeloma not having achieved remission: Secondary | ICD-10-CM | POA: Diagnosis not present

## 2021-02-10 ENCOUNTER — Other Ambulatory Visit: Payer: Self-pay | Admitting: *Deleted

## 2021-02-10 DIAGNOSIS — C9001 Multiple myeloma in remission: Secondary | ICD-10-CM

## 2021-02-10 MED ORDER — LENALIDOMIDE 15 MG PO CAPS: ORAL_CAPSULE | ORAL | 0 refills | Status: DC

## 2021-02-14 DIAGNOSIS — D649 Anemia, unspecified: Secondary | ICD-10-CM | POA: Diagnosis not present

## 2021-02-14 DIAGNOSIS — E118 Type 2 diabetes mellitus with unspecified complications: Secondary | ICD-10-CM | POA: Diagnosis not present

## 2021-02-14 DIAGNOSIS — J449 Chronic obstructive pulmonary disease, unspecified: Secondary | ICD-10-CM | POA: Diagnosis not present

## 2021-02-16 ENCOUNTER — Encounter (HOSPITAL_COMMUNITY): Payer: Self-pay

## 2021-02-16 ENCOUNTER — Encounter (HOSPITAL_COMMUNITY)
Admission: RE | Admit: 2021-02-16 | Discharge: 2021-02-16 | Disposition: A | Discharge status: Home / Self Care | Payer: Medicare PPO | Source: Ambulatory Visit | Attending: Otolaryngology | Admitting: Otolaryngology

## 2021-02-16 ENCOUNTER — Other Ambulatory Visit: Payer: Self-pay

## 2021-02-16 DIAGNOSIS — E119 Type 2 diabetes mellitus without complications: Secondary | ICD-10-CM | POA: Diagnosis not present

## 2021-02-16 DIAGNOSIS — I10 Essential (primary) hypertension: Secondary | ICD-10-CM | POA: Insufficient documentation

## 2021-02-16 DIAGNOSIS — Z01818 Encounter for other preprocedural examination: Secondary | ICD-10-CM | POA: Insufficient documentation

## 2021-02-16 HISTORY — DX: Multiple myeloma not having achieved remission: C90.00

## 2021-02-16 HISTORY — DX: Reserved for concepts with insufficient information to code with codable children: IMO0002

## 2021-02-16 HISTORY — DX: Leukemia, unspecified not having achieved remission: C95.90

## 2021-02-16 HISTORY — DX: Heart failure, unspecified: I50.9

## 2021-02-16 HISTORY — DX: Dyspnea, unspecified: R06.00

## 2021-02-16 HISTORY — DX: Systemic lupus erythematosus, unspecified: M32.9

## 2021-02-16 LAB — CBC
HCT: 38.6 % — ABNORMAL LOW (ref 39.0–52.0)
Hemoglobin: 12.5 g/dL — ABNORMAL LOW (ref 13.0–17.0)
MCH: 31.5 pg (ref 26.0–34.0)
MCHC: 32.4 g/dL (ref 30.0–36.0)
MCV: 97.2 fL (ref 80.0–100.0)
Platelets: 332 10*3/uL (ref 150–400)
RBC: 3.97 MIL/uL — ABNORMAL LOW (ref 4.22–5.81)
RDW: 15.1 % (ref 11.5–15.5)
WBC: 5.9 10*3/uL (ref 4.0–10.5)
nRBC: 0 % (ref 0.0–0.2)

## 2021-02-16 LAB — COMPREHENSIVE METABOLIC PANEL
ALT: 15 U/L (ref 0–44)
AST: 22 U/L (ref 15–41)
Albumin: 3.7 g/dL (ref 3.5–5.0)
Alkaline Phosphatase: 74 U/L (ref 38–126)
Anion gap: 11 (ref 5–15)
BUN: 18 mg/dL (ref 8–23)
CO2: 25 mmol/L (ref 22–32)
Calcium: 9.2 mg/dL (ref 8.9–10.3)
Chloride: 98 mmol/L (ref 98–111)
Creatinine, Ser: 1.3 mg/dL — ABNORMAL HIGH (ref 0.61–1.24)
GFR, Estimated: 57 mL/min — ABNORMAL LOW (ref >60)
Glucose, Bld: 494 mg/dL — ABNORMAL HIGH (ref 70–99)
Potassium: 5.5 mmol/L — ABNORMAL HIGH (ref 3.5–5.1)
Sodium: 134 mmol/L — ABNORMAL LOW (ref 135–145)
Total Bilirubin: 0.3 mg/dL (ref 0.3–1.2)
Total Protein: 6.6 g/dL (ref 6.5–8.1)

## 2021-02-16 LAB — HEMOGLOBIN A1C
Hgb A1c MFr Bld: 10.1 % — ABNORMAL HIGH (ref 4.8–5.6)
Mean Plasma Glucose: 243.17 mg/dL

## 2021-02-16 LAB — GLUCOSE, CAPILLARY: Glucose-Capillary: 453 mg/dL — ABNORMAL HIGH (ref 70–99)

## 2021-02-16 NOTE — Pre-Procedure Instructions (Addendum)
Surgical Instructions:    Your procedure is scheduled on Friday, July 22nd.  Report to [facility] Main Entrance "A" at 05:30 A.M., then check in with the Admitting office.  Call this number if you have any questions prior to your surgery date, or have problems the morning of surgery:  [phone]    Remember:  Do not eat or drink after midnight the night before your surgery.     Take these medicines the morning of surgery with A SIP OF WATER:   amLODipine (NORVASC) colestipol (COLESTID) finasteride (PROPECIA) HYSINGLA ER  lenalidomide (REVLIMID)  pantoprazole (PROTONIX)  Tamsulosin HCl (FLOMAX) tiZANidine (ZANAFLEX)   IF NEEDED: HYDROcodone-acetaminophen (NORCO) Oxycodone HCl traMADol (ULTRAM)  RESTASIS eye drops albuterol (VENTOLIN HFA) inhaler- Please bring with you the day of surgery.     As of today, STOP taking any Aspirin (unless otherwise instructed by your surgeon), aspirin-sod bicarb-citric acid (ALKA-SELTZER), Aleve, Naproxen, Ibuprofen, Motrin, Advil, Goody's, BC's, all herbal medications, fish oil, and all vitamins.            WHAT DO I DO ABOUT MY DIABETES MEDICATION?  THE NIGHT BEFORE SURGERY, take 15 units of insulin degludec (TRESIBA FLEXTOUCH).      THE MORNING OF SURGERY, Do not take pioglitazone (ACTOS).    HOW TO MANAGE YOUR DIABETES BEFORE AND AFTER SURGERY  Why is it important to control my blood sugar before and after surgery? Improving blood sugar levels before and after surgery helps healing and can limit problems. A way of improving blood sugar control is eating a healthy diet by:  Eating less sugar and carbohydrates  Increasing activity/exercise  Talking with your doctor about reaching your blood sugar goals High blood sugars (greater than 180 mg/dL) can raise your risk of infections and slow your recovery, so you will need to focus on controlling your diabetes during the weeks before surgery. Make sure that the doctor who takes care  of your diabetes knows about your planned surgery including the date and location.  How do I manage my blood sugar before surgery? Check your blood sugar at least 4 times a day, starting 2 days before surgery, to make sure that the level is not too high or low.  Check your blood sugar the morning of your surgery when you wake up and every 2 hours until you get to the Short Stay unit.  If your blood sugar is less than 70 mg/dL, you will need to treat for low blood sugar: Do not take insulin. Treat a low blood sugar (less than 70 mg/dL) with  cup of clear juice (cranberry or apple), 4 glucose tablets, OR glucose gel. Recheck blood sugar in 15 minutes after treatment (to make sure it is greater than 70 mg/dL). If your blood sugar is not greater than 70 mg/dL on recheck, call [phone] for further instructions. Report your blood sugar to the short stay nurse when you get to Short Stay.  If you are admitted to the hospital after surgery: Your blood sugar will be checked by the staff and you will probably be given insulin after surgery (instead of oral diabetes medicines) to make sure you have good blood sugar levels. The goal for blood sugar control after surgery is 80-180 mg/dL.    Special instructions:    [place]- Preparing For Surgery  Before surgery, you can play an important role. Because skin is not sterile, your skin needs to be as free of germs as possible. You can reduce the number of  germs on your skin by washing with CHG (chlorahexidine gluconate) Soap before surgery.  CHG is an antiseptic cleaner which kills germs and bonds with the skin to continue killing germs even after washing.     Please do not use if you have an allergy to CHG or antibacterial soaps. If your skin becomes reddened/irritated stop using the CHG.  Do not shave (including legs and underarms) for at least 48 hours prior to first CHG shower. It is OK to shave your face.  Please follow these instructions  carefully.     Shower the NIGHT BEFORE SURGERY and the MORNING OF SURGERY with CHG Soap.   If you chose to wash your hair, wash your hair first as usual with your normal shampoo. After you shampoo, rinse your hair and body thoroughly to remove the shampoo.  Then [employer] and genitals (private parts) with your normal soap and rinse thoroughly to remove soap.  After that Use CHG Soap as you would any other liquid soap. You can apply CHG directly to the skin and wash gently with a scrungie or a clean washcloth.   Apply the CHG Soap to your body ONLY FROM THE NECK DOWN.  Do not use on open wounds or open sores. Avoid contact with your eyes, ears, mouth and genitals (private parts). Wash Face and genitals (private parts)  with your normal soap.   Wash thoroughly, paying special attention to the area where your surgery will be performed.  Thoroughly rinse your body with warm water from the neck down.  DO NOT shower/wash with your normal soap after using and rinsing off the CHG Soap.  Pat yourself dry with a CLEAN TOWEL.  Wear CLEAN PAJAMAS to bed the night before surgery  Place CLEAN SHEETS on your bed the night before your surgery  DO NOT SLEEP WITH PETS.   Day of Surgery:  Take a shower with CHG soap. Wear Clean/Comfortable clothing the morning of surgery Do not wear lotions, powders, colognes, or deodorant.   Remember to brush your teeth WITH YOUR REGULAR TOOTHPASTE. Do not wear jewelry. Men may shave face and neck. Do not bring valuables to the hospital. [facility] is not responsible for any belongings or valuables.  Do NOT Smoke (Tobacco/Vaping) or drink Alcohol 24 hours prior to your procedure.  If you use a CPAP at night, you may bring all equipment for your overnight stay.   Contacts, glasses, dentures or bridgework may not be worn into surgery, please bring cases for these belongings.   For patients admitted to the hospital, discharge time will be determined by your  treatment team.  Patients discharged the day of surgery will not be allowed to drive home, and someone needs to stay with them for 24 hours.  ONLY 1 SUPPORT PERSON MAY BE PRESENT WHILE YOU ARE IN SURGERY. IF YOU ARE TO BE ADMITTED ONCE YOU ARE IN YOUR ROOM YOU WILL BE ALLOWED TWO (2) VISITORS.  Minor children may have two parents present. Special consideration for safety and communication needs will be reviewed on a case by case basis.     Please read over the following fact sheets that you were given.

## 2021-02-16 NOTE — Progress Notes (Addendum)
PCP - Currently Ashley Akin, NP w/ [facility]; formerly [name], MD w/ [facility]; records requested Cardiologist - Denies Endocrinologist- Madelin Rear, MD w/ [facility]; records requested  PPM/ICD - Denies  Chest x-ray - N/A EKG - 02/16/21; Abnormal Stress Test - 12/11/18 ECHO - 10/16/18 Cardiac Cath - Denies  Sleep Study - Yes, positive for OSA CPAP - No  Checks Blood Sugar 4-6x daily w/ freestyle libre CBG at PAT appointment was 453. A1C obtained  Blood Thinner Instructions: N/A Aspirin Instructions: N/A  ERAS Protcol - N/A PRE-SURGERY Ensure or G2- N/A  COVID TEST- N/A ; Ambulatory   Anesthesia review: Yes, cardiac hx; review PCP and endocrinology last office notes and labs; abnormal EKG, elevated CBG at PAT appt  Patient denies shortness of breath, fever, cough and chest pain at PAT appointment   All instructions explained to the patient, with a verbal understanding of the material. Patient agrees to go over the instructions while at home for a better understanding. The opportunity to ask questions was provided.

## 2021-02-16 NOTE — Progress Notes (Signed)
Called and left VM for Kika, Dr. Victorio Palm assistant concerning elevated CBG, Serum glucose and A1C at PAT appt. IBM'd Dr. Wilburn Cornelia concerning same.

## 2021-02-17 NOTE — Anesthesia Preprocedure Evaluation (Addendum)
Anesthesia Evaluation  Patient identified by MRN, date of birth, ID band Patient awake    Reviewed: Allergy & Precautions, NPO status , Patient's Chart, lab work & pertinent test results  Airway Mallampati: II  TM Distance: >3 FB Neck ROM: Full    Dental  (+) Loose, Missing,  Gold right upper central:   Pulmonary sleep apnea , former smoker,    Pulmonary exam normal breath sounds clear to auscultation       Cardiovascular hypertension, Pt. on medications +CHF  Normal cardiovascular exam Rhythm:Regular Rate:Normal  ECG: rate 84.  Normal sinus rhythm Left axis deviation Right bundle branch block   Neuro/Psych  Headaches, negative psych ROS   GI/Hepatic GERD  Medicated and Controlled,(+) Hepatitis -, C  Endo/Other  diabetes, Insulin Dependent  Renal/GU negative Renal ROS     Musculoskeletal  (+) Arthritis ,   Abdominal   Peds  Hematology  (+) anemia , Multiple myeloma   Anesthesia Other Findings Laryngeal Mass  Reproductive/Obstetrics                           Anesthesia Physical Anesthesia Plan  ASA: 3  Anesthesia Plan: General   Post-op Pain Management:    Induction: Intravenous  PONV Risk Score and Plan: 2 and Ondansetron, Dexamethasone and Treatment may vary due to age or medical condition  Airway Management Planned: Oral ETT  Additional Equipment:   Intra-op Plan:   Post-operative Plan: Extubation in OR  Informed Consent: I have reviewed the patients History and Physical, chart, labs and discussed the procedure including the risks, benefits and alternatives for the proposed anesthesia with the patient or authorized representative who has indicated his/her understanding and acceptance.     Dental advisory given  Plan Discussed with: CRNA  Anesthesia Plan Comments: (Jet ventilation per surgeon request  PAT note by Antionette Poles, PA-C: Long history of uncontrolled IDDM  2.  Preop labs show glucose 494, A1c 10.1. This is followed by endocrinologist Dr. Marlene Lard. These results were caled to Dr. Thurmon Fair office. Advised if pt presents on DOS with markedly elevated BG heis at risk for cancellation. Follows with oncologist Dr. Milinda Cave for multiple myeloma diagnosed 2017.  He is maintained on Revlimid and dexamethasone. History of OSA, not on CPAP. Creatinine mildly elevated on preop labs at 1.30.  Consistent with history of renal sufficiency.  Potassium mildly elevated at 5.5. EKG 02/16/2021: Normal sinus rhythm.  Rate 84. Left axis deviation. Right bundle branch block. Minimal voltage criteria for LVH, may be normal variant Nuclear stress 12/11/2018:  . Nuclear stress EF: 63%. The left ventricular ejection fraction is normal (55-65%). . There was no ST segment deviation noted during stress. . This is a low risk study. There is no evidence of ischemia or infarction . The study is normal.  TTE 10/16/2018: 1. The left ventricle has normal systolic function with an ejection  fraction of 60-65%. The cavity size was normal. There is mildly increased  left ventricular wall thickness. Left ventricular diastolic parameters  were normal. There is abnormal septal  motion secondary to conduction delay.  2. The right ventricle has normal systolic function. The cavity was  normal. There is no increase in right ventricular wall thickness.  3. The aortic valve is grossly normal Mild calcification of the aortic  valve. Aortic valve regurgitation is trivial by color flow Doppler.  4. The ascending aorta is normal in size and structure.   )  Anesthesia Quick Evaluation  

## 2021-02-17 NOTE — Progress Notes (Signed)
Anesthesia Chart Review:  Long history of uncontrolled IDDM 2.  Preop labs show glucose 494, A1c 10.1. This is followed by endocrinologist Dr. Madelin Rear. These results were caled to Dr. Victorio Palm office. Advised if pt presents on DOS with markedly elevated BG heis at risk for cancellation.  Follows with oncologist Dr. Roxy Cedar for multiple myeloma diagnosed 2017.  He is maintained on Revlimid and dexamethasone.  History of OSA, not on CPAP.  Creatinine mildly elevated on preop labs at 1.30.  Consistent with history of renal sufficiency.  Potassium mildly elevated at 5.5.  EKG 02/16/2021: Normal sinus rhythm.  Rate 84. Left axis deviation. Right bundle branch block. Minimal voltage criteria for LVH, may be normal variant  Nuclear stress 12/11/2018:  Nuclear stress EF: 63%. The left ventricular ejection fraction is normal (55-65%). There was no ST segment deviation noted during stress. This is a low risk study. There is no evidence of ischemia or infarction The study is normal.   TTE 10/16/2018:  1. The left ventricle has normal systolic function with an ejection  fraction of 60-65%. The cavity size was normal. There is mildly increased  left ventricular wall thickness. Left ventricular diastolic parameters  were normal. There is abnormal septal  motion secondary to conduction delay.   2. The right ventricle has normal systolic function. The cavity was  normal. There is no increase in right ventricular wall thickness.   3. The aortic valve is grossly normal Mild calcification of the aortic  valve. Aortic valve regurgitation is trivial by color flow Doppler.   4. The ascending aorta is normal in size and structure.     James [name], [facility] Short Stay Center/Anesthesiology Phone [phone] 02/17/2021 8:54 AM

## 2021-02-18 ENCOUNTER — Encounter (HOSPITAL_COMMUNITY)
Admission: RE | Disposition: A | Discharge status: Home / Self Care | Payer: Self-pay | Source: Home / Self Care | Attending: Otolaryngology

## 2021-02-18 ENCOUNTER — Ambulatory Visit (HOSPITAL_COMMUNITY): Payer: Medicare HMO | Admitting: Vascular Surgery

## 2021-02-18 ENCOUNTER — Ambulatory Visit (HOSPITAL_COMMUNITY): Payer: Medicare HMO | Admitting: Certified Registered"

## 2021-02-18 ENCOUNTER — Encounter (HOSPITAL_COMMUNITY): Payer: Self-pay | Admitting: Otolaryngology

## 2021-02-18 ENCOUNTER — Ambulatory Visit (HOSPITAL_COMMUNITY)
Admission: RE | Admit: 2021-02-18 | Discharge: 2021-02-18 | Disposition: A | Discharge status: Home / Self Care | Payer: Medicare HMO | Attending: Otolaryngology | Admitting: Otolaryngology

## 2021-02-18 DIAGNOSIS — R490 Dysphonia: Secondary | ICD-10-CM

## 2021-02-18 DIAGNOSIS — Z794 Long term (current) use of insulin: Secondary | ICD-10-CM | POA: Diagnosis not present

## 2021-02-18 DIAGNOSIS — Z79899 Other long term (current) drug therapy: Secondary | ICD-10-CM | POA: Insufficient documentation

## 2021-02-18 DIAGNOSIS — Z7952 Long term (current) use of systemic steroids: Secondary | ICD-10-CM | POA: Insufficient documentation

## 2021-02-18 DIAGNOSIS — J387 Other diseases of larynx: Secondary | ICD-10-CM | POA: Diagnosis not present

## 2021-02-18 DIAGNOSIS — Z888 Allergy status to other drugs, medicaments and biological substances status: Secondary | ICD-10-CM | POA: Insufficient documentation

## 2021-02-18 DIAGNOSIS — Z79891 Long term (current) use of opiate analgesic: Secondary | ICD-10-CM | POA: Diagnosis not present

## 2021-02-18 DIAGNOSIS — D141 Benign neoplasm of larynx: Secondary | ICD-10-CM | POA: Insufficient documentation

## 2021-02-18 DIAGNOSIS — I2510 Atherosclerotic heart disease of native coronary artery without angina pectoris: Secondary | ICD-10-CM | POA: Diagnosis not present

## 2021-02-18 DIAGNOSIS — Z87891 Personal history of nicotine dependence: Secondary | ICD-10-CM | POA: Insufficient documentation

## 2021-02-18 DIAGNOSIS — I110 Hypertensive heart disease with heart failure: Secondary | ICD-10-CM | POA: Diagnosis not present

## 2021-02-18 DIAGNOSIS — I509 Heart failure, unspecified: Secondary | ICD-10-CM | POA: Diagnosis not present

## 2021-02-18 DIAGNOSIS — J383 Other diseases of vocal cords: Secondary | ICD-10-CM | POA: Diagnosis not present

## 2021-02-18 DIAGNOSIS — E119 Type 2 diabetes mellitus without complications: Secondary | ICD-10-CM | POA: Diagnosis not present

## 2021-02-18 HISTORY — PX: MICROLARYNGOSCOPY: SHX5208

## 2021-02-18 LAB — GLUCOSE, CAPILLARY
Glucose-Capillary: 173 mg/dL — ABNORMAL HIGH (ref 70–99)
Glucose-Capillary: 118 mg/dL — ABNORMAL HIGH (ref 70–99)

## 2021-02-18 SURGERY — MICROLARYNGOSCOPY
Anesthesia: General | Site: Throat

## 2021-02-18 MED ORDER — FENTANYL CITRATE (PF) 100 MCG/2ML IJ SOLN
INTRAMUSCULAR | Status: AC
  Filled 2021-02-18: qty 2

## 2021-02-18 MED ORDER — ROCURONIUM BROMIDE 10 MG/ML (PF) SYRINGE
PREFILLED_SYRINGE | INTRAVENOUS | Status: AC
  Filled 2021-02-18: qty 10

## 2021-02-18 MED ORDER — EPINEPHRINE HCL (NASAL) 0.1 % NA SOLN
NASAL | Status: AC
  Filled 2021-02-18: qty 30

## 2021-02-18 MED ORDER — MIDAZOLAM HCL 2 MG/2ML IJ SOLN
INTRAMUSCULAR | Status: AC
  Filled 2021-02-18: qty 2

## 2021-02-18 MED ORDER — ONDANSETRON HCL 4 MG/2ML IJ SOLN
INTRAMUSCULAR | Status: AC
  Filled 2021-02-18: qty 2

## 2021-02-18 MED ORDER — 0.9 % SODIUM CHLORIDE (POUR BTL) OPTIME
TOPICAL | Status: DC | PRN
  Administered 2021-02-18: 1000 mL

## 2021-02-18 MED ORDER — LIDOCAINE 2% (20 MG/ML) 5 ML SYRINGE
INTRAMUSCULAR | Status: AC
  Filled 2021-02-18: qty 5

## 2021-02-18 MED ORDER — EPINEPHRINE HCL (NASAL) 0.1 % NA SOLN
NASAL | Status: DC | PRN
  Administered 2021-02-18: 1 [drp] via TOPICAL

## 2021-02-18 MED ORDER — AMISULPRIDE (ANTIEMETIC) 5 MG/2ML IV SOLN: 10.0000 mg | Freq: Once | INTRAVENOUS | Status: DC | PRN

## 2021-02-18 MED ORDER — SUGAMMADEX SODIUM 200 MG/2ML IV SOLN
INTRAVENOUS | Status: DC | PRN
  Administered 2021-02-18: 400 mg via INTRAVENOUS

## 2021-02-18 MED ORDER — ONDANSETRON HCL 4 MG/2ML IJ SOLN: 4.0000 mg | Freq: Once | INTRAMUSCULAR | Status: DC | PRN

## 2021-02-18 MED ORDER — PROPOFOL 10 MG/ML IV BOLUS
INTRAVENOUS | Status: AC
  Filled 2021-02-18: qty 20

## 2021-02-18 MED ORDER — FENTANYL CITRATE (PF) 250 MCG/5ML IJ SOLN
INTRAMUSCULAR | Status: AC
  Filled 2021-02-18: qty 5

## 2021-02-18 MED ORDER — CHLORHEXIDINE GLUCONATE 0.12 % MT SOLN
15.0000 mL | Freq: Once | OROMUCOSAL | Status: AC
  Administered 2021-02-18: 15 mL via OROMUCOSAL
  Filled 2021-02-18: qty 15

## 2021-02-18 MED ORDER — PROPOFOL 10 MG/ML IV BOLUS
INTRAVENOUS | Status: DC | PRN
  Administered 2021-02-18: 100 mg via INTRAVENOUS

## 2021-02-18 MED ORDER — ORAL CARE MOUTH RINSE: 15.0000 mL | Freq: Once | OROMUCOSAL | Status: AC

## 2021-02-18 MED ORDER — PROPOFOL 500 MG/50ML IV EMUL
INTRAVENOUS | Status: DC | PRN
  Administered 2021-02-18: 100 ug/kg/min via INTRAVENOUS

## 2021-02-18 MED ORDER — MIDAZOLAM HCL 2 MG/2ML IJ SOLN
INTRAMUSCULAR | Status: DC | PRN
  Administered 2021-02-18: 2 mg via INTRAVENOUS

## 2021-02-18 MED ORDER — LACTATED RINGERS IV SOLN: INTRAVENOUS | Status: DC

## 2021-02-18 MED ORDER — ROCURONIUM BROMIDE 10 MG/ML (PF) SYRINGE
PREFILLED_SYRINGE | INTRAVENOUS | Status: DC | PRN
  Administered 2021-02-18: 40 mg via INTRAVENOUS

## 2021-02-18 MED ORDER — FENTANYL CITRATE (PF) 100 MCG/2ML IJ SOLN
25.0000 ug | INTRAMUSCULAR | Status: DC | PRN
  Administered 2021-02-18 (×2): 25 ug via INTRAVENOUS

## 2021-02-18 MED ORDER — ACETAMINOPHEN 10 MG/ML IV SOLN: 1000.0000 mg | Freq: Once | INTRAVENOUS | Status: DC | PRN

## 2021-02-18 MED ORDER — FENTANYL CITRATE (PF) 100 MCG/2ML IJ SOLN
INTRAMUSCULAR | Status: DC | PRN
  Administered 2021-02-18: 100 ug via INTRAVENOUS

## 2021-02-18 MED ORDER — PROPOFOL 1000 MG/100ML IV EMUL
INTRAVENOUS | Status: AC
  Filled 2021-02-18: qty 300

## 2021-02-18 MED ORDER — LIDOCAINE 2% (20 MG/ML) 5 ML SYRINGE
INTRAMUSCULAR | Status: DC | PRN
Start: 2021-02-18 — End: 2021-02-18
  Administered 2021-02-18: 60 mg via INTRAVENOUS

## 2021-02-18 SURGICAL SUPPLY — 22 items
CANISTER SUCT 3000ML PPV (MISCELLANEOUS) ×2 IMPLANT
CNTNR URN SCR LID CUP LEK RST (MISCELLANEOUS) IMPLANT
CONT SPEC 4OZ STRL OR WHT (MISCELLANEOUS) ×2
COVER BACK TABLE 60X90IN (DRAPES) ×2 IMPLANT
COVER MAYO STAND STRL (DRAPES) ×2 IMPLANT
DRAPE HALF SHEET 40X57 (DRAPES) ×2 IMPLANT
DRSG TELFA 3X8 NADH (GAUZE/BANDAGES/DRESSINGS) ×2 IMPLANT
GLOVE SURG ENC TEXT LTX SZ7 (GLOVE) ×2 IMPLANT
GUARD TEETH (MISCELLANEOUS) IMPLANT
KIT BASIN OR (CUSTOM PROCEDURE TRAY) ×1 IMPLANT
KIT TURNOVER KIT B (KITS) ×2 IMPLANT
NDL 18GX1X1/2 (RX/OR ONLY) (NEEDLE) IMPLANT
NEEDLE 18GX1X1/2 (RX/OR ONLY) (NEEDLE) ×2 IMPLANT
NS IRRIG 1000ML POUR BTL (IV SOLUTION) ×2 IMPLANT
PAD ARMBOARD 7.5X6 YLW CONV (MISCELLANEOUS) ×2 IMPLANT
PAD DRESSING TELFA 3X8 NADH (GAUZE/BANDAGES/DRESSINGS) IMPLANT
PATTIES SURGICAL .5 X1 (DISPOSABLE) ×2 IMPLANT
SOL ANTI FOG 6CC (MISCELLANEOUS) ×1 IMPLANT
SOLUTION ANTI FOG 6CC (MISCELLANEOUS) ×1
SURGILUBE 2OZ TUBE FLIPTOP (MISCELLANEOUS) IMPLANT
TOWEL GREEN STERILE FF (TOWEL DISPOSABLE) ×2 IMPLANT
TUBE CONNECTING 12X1/4 (SUCTIONS) ×2 IMPLANT

## 2021-02-18 NOTE — Transfer of Care (Signed)
Immediate Anesthesia Transfer of Care Note  Patient: James [name].  Procedure(s) Performed: MICROLARYNGOSCOPY with Excisional Biopsy (Throat)  Patient Location: PACU  Anesthesia Type:General  Level of Consciousness: drowsy and patient cooperative  Airway & Oxygen Therapy: Patient Spontanous Breathing and Patient connected to nasal cannula oxygen  Post-op Assessment: Report given to RN and Post -op Vital signs reviewed and stable  Post vital signs: Reviewed and stable  Last Vitals:  Vitals Value Taken Time  BP 100/60 02/18/21 0808  Temp    Pulse 69 02/18/21 0810  Resp 16 02/18/21 0810  SpO2 100 % 02/18/21 0810  Vitals shown include unvalidated device data.  Last Pain:  Vitals:   02/18/21 0617  TempSrc:   PainSc: 9       Patients Stated Pain Goal: 5 ([id number])  Complications: No notable events documented.

## 2021-02-18 NOTE — H&P (Signed)
James [name]. is an 75 y.o. male.   Chief Complaint: Chronic hoarseness HPI: Patient with history of chronic hoarseness, no respiratory distress.  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  Past Medical History:  Diagnosis Date   Anemia    Arthritis    BPH (benign prostatic hyperplasia)    CHF (congestive heart failure) (HCC)    Chronic pain    Diabetes mellitus    Dyspnea    GERD (gastroesophageal reflux disease)    Headache(784.0)    migraines, sinus headaches   Hepatitis    C and B-treated   HLD (hyperlipidemia)    Hypertension    Leukemia ([place])    Lupus ([place])    Multiple myeloma ([place])    Pneumonia 07/2011   Sleep apnea 03/2018   going for fitting on 04-29-18   Thyroid disease    goiter   Urinary frequency     Past Surgical History:  Procedure Laterality Date   ABDOMINAL SURGERY     BACK SURGERY     x 5    BIOPSY  01/02/2020   Procedure: BIOPSY;  Surgeon: Carol Ada, MD;  Location: [facility];  Service: Endoscopy;;   CHOLECYSTECTOMY     COLONOSCOPY     COLONOSCOPY WITH PROPOFOL N/A 01/02/2020   Procedure: COLONOSCOPY WITH PROPOFOL;  Surgeon: Carol Ada, MD;  Location: [facility];  Service: Endoscopy;  Laterality: N/A;   cyst removal skull  20 years ago   ETHMOIDECTOMY  10/12/2011   Procedure: ETHMOIDECTOMY;  Surgeon: Thornell Sartorius, MD;  Location: [facility] OR;  Service: ENT;  Laterality: Bilateral;  bilateral maxillary sinus osteal enlargement, frontal sinusotomy   EYE SURGERY     bilat cataract with lens implants   HAND SURGERY     right finger   HERNIA REPAIR     POLYPECTOMY  01/02/2020   Procedure: POLYPECTOMY;  Surgeon: Carol Ada, MD;  Location: [facility];  Service: Endoscopy;;   ROTATOR CUFF REPAIR     bilateral   SHOULDER ARTHROSCOPY WITH ROTATOR CUFF REPAIR Left 05/18/2014   Procedure: SHOULDER ARTHROSCOPY WITH ROTATOR CUFF REPAIR;  Surgeon: Nita Sells, MD;   Location: [place];  Service: Orthopedics;  Laterality: Left;  Left shoulder arthroscopy rotator cuff repair, subacromial decompression   sinus surgery     TONSILLECTOMY     TRIGGER FINGER RELEASE Right 05/02/2018   Procedure: RELEASE TRIGGER FINGER/A-1 PULLEY RIGHT SMALL FINGER;  Surgeon: Leanora Cover, MD;  Location: [place];  Service: Orthopedics;  Laterality: Right;    Family History  Problem Relation Age of Onset   Hyperlipidemia Mother    Hypertension Mother    Social History:  reports that he quit smoking about 36 years ago. His smoking use included cigarettes. He has never used smokeless tobacco. He reports current drug use. Drug: Heroin. He reports that he does not drink alcohol.  Allergies:  Allergies  Allergen Reactions   Invokana [Canagliflozin] Anaphylaxis    Facial/neck/ lip swelling    Medications Prior to Admission  Medication Sig Dispense Refill   albuterol (VENTOLIN HFA) 108 (90 Base) MCG/ACT inhaler Inhale 1-2 puffs into the lungs every 6 (six) hours as needed for wheezing or shortness of breath.     amLODipine (NORVASC) 10 MG tablet Take 10 mg by mouth every morning.      aspirin-sod bicarb-citric acid (ALKA-SELTZER) 325 MG TBEF tablet Take 650 mg by mouth every 6 (six) hours  as needed (indigestion / headaches).     Calcium Carbonate-Vitamin D (CALCIUM 600+D PO) Take 1 tablet by mouth in the morning and at bedtime.     colestipol (COLESTID) 1 g tablet Take 1 g by mouth 2 (two) times daily.     dexamethasone (DECADRON) 4 MG tablet TAKE 5 TABLETS BY MOUTH ONCE A WEEK (Patient taking differently: Take 20 mg by mouth every Wednesday. TAKE 5 TABLETS BY MOUTH ONCE A WEEK) 64 tablet 0   diphenhydrAMINE HCl (ZZZQUIL) 50 MG/30ML LIQD Take 30 mLs by mouth at bedtime as needed (sleep).     Ferrous Fumarate (HEMOCYTE - 106 MG FE) 324 (106 Fe) MG TABS tablet Take 1 tablet by mouth 2 (two) times daily.     finasteride (PROPECIA) 1 MG tablet Take 1  mg by mouth in the morning.     gabapentin (NEURONTIN) 400 MG capsule Take 400-800 mg by mouth See admin instructions. Take 400mg  at lunch time and  2 Capsules (800 mg) at bedtime     hydrochlorothiazide (HYDRODIURIL) 25 MG tablet Take 25 mg by mouth in the morning.     HYDROcodone-acetaminophen (NORCO) 10-325 MG tablet Take 1 tablet by mouth in the morning, at noon, and at bedtime.     HYSINGLA ER 60 MG T24A Take 60 mg by mouth in the morning.     insulin degludec (TRESIBA FLEXTOUCH) 200 UNIT/ML FlexTouch Pen Inject 30 Units into the skin daily after supper.     lenalidomide (REVLIMID) 15 MG capsule TAKE 1 CAPSULE BY MOUTH EVERY DAY FOR 21 DAYS ON THEN 7 DAYS OFF. REPEAT EVERY 28 DAYS (Patient taking differently: Take 15 mg by mouth as directed. TAKE 1 CAPSULE BY MOUTH EVERY DAY FOR 21 DAYS ON THEN 7 DAYS OFF. REPEAT EVERY 28 DAYS) 21 capsule 0   losartan (COZAAR) 50 MG tablet Take 50 mg by mouth every morning.      Oxycodone HCl 20 MG TABS Take 20 mg by mouth every 8 (eight) hours as needed (pain.).     pantoprazole (PROTONIX) 40 MG tablet Take 40 mg by mouth 2 (two) times daily.     pioglitazone (ACTOS) 15 MG tablet Take 15 mg by mouth in the morning.     RESTASIS 0.05 % ophthalmic emulsion Place 1 drop into both eyes 2 (two) times daily as needed for dry eyes.     Tamsulosin HCl (FLOMAX) 0.4 MG CAPS Take 0.4 mg by mouth 2 (two) times daily.      tiZANidine (ZANAFLEX) 4 MG tablet Take 4-8 mg by mouth See admin instructions. Take 1 tablet (4 mg) by mouth in the morning & take 2 tablets (8 mg) by mouth at night.     traMADol (ULTRAM) 50 MG tablet Take 50 mg by mouth 2 (two) times daily.     vitamin B-12 (CYANOCOBALAMIN) 1000 MCG tablet Take 1,000 mcg by mouth in the morning and at bedtime.     bismuth subsalicylate (PEPTO BISMOL) 262 MG/15ML suspension Take 30 mLs by mouth every 6 (six) hours as needed for indigestion or diarrhea or loose stools.     TRESIBA FLEXTOUCH 100 UNIT/ML FlexTouch Pen  Inject 30 Units into the skin daily.      Results for orders placed or performed during the hospital encounter of 02/18/21 (from the past 48 hour(s))  Glucose, capillary     Status: Abnormal   Collection Time: 02/18/21  5:59 AM  Result Value Ref Range   Glucose-Capillary 173 (H) 70 -  99 mg/dL    Comment: Glucose reference range applies only to samples taken after fasting for at least 8 hours.   Comment 1 Notify RN    No results found.  Review of Systems  Constitutional: Negative.   HENT:  Positive for voice change.   Respiratory: Negative.     Blood pressure 129/64, pulse 76, temperature 98.5 F (36.9 C), temperature source Oral, resp. rate 20, height 6' (1.829 m), weight 87.6 kg, SpO2 99 %. Physical Exam Constitutional:      Appearance: He is normal weight.  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 Cardiovascular:     Rate and Rhythm: Normal rate.     Pulses: Normal pulses.  Musculoskeletal:     Cervical back: Normal range of motion.  Neurological:     Mental Status: He is alert.     Assessment/Plan Patient admitted for microlaryngoscopy and excision of posterior glottic mass as an outpatient under general anesthesia.  Jerrell Belfast, MD 02/18/2021, 7:28 AM

## 2021-02-18 NOTE — Anesthesia Postprocedure Evaluation (Signed)
Anesthesia Post Note  Patient: James [name].  Procedure(s) Performed: MICROLARYNGOSCOPY with Excisional Biopsy (Throat)     Patient location during evaluation: PACU Anesthesia Type: General Level of consciousness: awake Pain management: pain level controlled Vital Signs Assessment: post-procedure vital signs reviewed and stable Respiratory status: spontaneous breathing, nonlabored ventilation, respiratory function stable and patient connected to nasal cannula oxygen Cardiovascular status: blood pressure returned to baseline and stable Postop Assessment: no apparent nausea or vomiting Anesthetic complications: no   No notable events documented.  Last Vitals:  Vitals:   02/18/21 0900 02/18/21 0905  BP:  110/64  Pulse: 62 (!) 57  Resp: 12 15  Temp:  36.4 C  SpO2: 96% 99%    Last Pain:  Vitals:   02/18/21 0839  TempSrc:   PainSc: 10-Worst pain ever                 Asael Pann P Seymour Pavlak

## 2021-02-18 NOTE — Op Note (Signed)
Operative Note: MICROLARYNGOSCOPY and BIOPSY  Patient: James [name].  Medical record number: [id number]  Date:02/18/2021  Pre-operative Indications: 1.  Chronic hoarseness     2.  Laryngeal mass   Postoperative Indications: Same  Surgical Procedure:  Microlaryngoscopy with jet ventilation    Excision left laryngeal mass  Anesthesia: General/JET  Surgeon: Delsa Bern, M.D.  Assist: None  Complications: None  EBL: None   Brief History: The patient is a 75 y.o. male with a history of chronic hoarseness.  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  Given the patient's history and findings I recommended microlaryngoscopy with jet ventilation and excision of laryngeal mass under general anesthesia, risks and benefits were discussed in detail with the patient and their family. They understand and agree with our plan for surgery which is scheduled at [facility] in OR on an elective basis.  Surgical Procedure: The patient is brought to the operating room on 02/18/2021 and placed in supine position on the operating table. When the patient was adequately anesthetized, surgical timeout was performed and correct identification of the patient and the surgical procedure. The patient was positioned and prepped and draped in sterile fashion.  Ventilating laryngoscope was then inserted and examination of the oral cavity, oropharynx, supraglottis, larynx and hypopharynx was undertaken.  No other abnormalities were identified.  The laryngoscope was positioned for direct visualization of the patient's larynx.  Patient's oxygenation and airway were maintained with jet ventilation throughout the procedure.  Patient ventilated well with good gas exchange.  With the patient stable and prepared for surgery with adequate jet ventilation, the operating microscope was moved into position for  microlaryngoscopy.  Excellent visualization of the entire larynx was achieved.  The patient was found to have pedunculated soft tissue mass involving the left posterior aspect of the vocal cord.  Using cup forceps, microscissors and gentle dissection, the mass was excised and sent to pathology for gross microscopic evaluation.  Reexamination showed no evidence of residual disease and minimal bleeding.  The patient's airway was stable, ventilating laryngoscope was removed without difficulty and the patient was managed with mask ventilation anesthesia for the remainder of his anesthesia recovery.  There was no evidence of airway trauma, no bleeding, no intraoral injury or tooth trauma.  Patient was awakened from anesthetic and transferred from the operating room to the recovery room in stable condition. There were no complications and blood loss was minimal.   Delsa Bern, M.D. [facility] 02/18/2021

## 2021-02-19 ENCOUNTER — Encounter (HOSPITAL_COMMUNITY): Payer: Self-pay | Admitting: Otolaryngology

## 2021-02-22 DIAGNOSIS — D649 Anemia, unspecified: Secondary | ICD-10-CM | POA: Diagnosis not present

## 2021-02-22 DIAGNOSIS — E1149 Type 2 diabetes mellitus with other diabetic neurological complication: Secondary | ICD-10-CM | POA: Diagnosis not present

## 2021-02-22 DIAGNOSIS — I158 Other secondary hypertension: Secondary | ICD-10-CM | POA: Diagnosis not present

## 2021-02-22 LAB — SURGICAL PATHOLOGY

## 2021-02-23 ENCOUNTER — Other Ambulatory Visit: Payer: Medicare HMO

## 2021-03-02 ENCOUNTER — Ambulatory Visit: Payer: Medicare HMO | Admitting: Oncology

## 2021-03-03 ENCOUNTER — Other Ambulatory Visit: Payer: Self-pay | Admitting: Internal Medicine

## 2021-03-03 ENCOUNTER — Other Ambulatory Visit: Payer: Self-pay | Admitting: *Deleted

## 2021-03-03 ENCOUNTER — Other Ambulatory Visit: Payer: Self-pay | Admitting: Oncology

## 2021-03-03 DIAGNOSIS — C9001 Multiple myeloma in remission: Secondary | ICD-10-CM

## 2021-03-03 MED ORDER — LENALIDOMIDE 15 MG PO CAPS: ORAL_CAPSULE | ORAL | 0 refills | Status: DC

## 2021-03-03 NOTE — Telephone Encounter (Signed)
Refilled through [facility] directly

## 2021-03-08 ENCOUNTER — Other Ambulatory Visit: Payer: Self-pay | Admitting: *Deleted

## 2021-03-08 DIAGNOSIS — C9001 Multiple myeloma in remission: Secondary | ICD-10-CM

## 2021-03-08 MED ORDER — LENALIDOMIDE 15 MG PO CAPS: ORAL_CAPSULE | ORAL | 0 refills | Status: DC

## 2021-03-23 DIAGNOSIS — E1165 Type 2 diabetes mellitus with hyperglycemia: Secondary | ICD-10-CM | POA: Diagnosis not present

## 2021-03-30 ENCOUNTER — Inpatient Hospital Stay: Payer: Medicare PPO | Attending: Oncology

## 2021-03-30 ENCOUNTER — Other Ambulatory Visit: Payer: Self-pay

## 2021-03-30 ENCOUNTER — Inpatient Hospital Stay (HOSPITAL_BASED_OUTPATIENT_CLINIC_OR_DEPARTMENT_OTHER): Payer: Medicare PPO | Admitting: Oncology

## 2021-03-30 VITALS — BP 133/57 | HR 98 | Temp 97.7°F | Resp 19 | Ht 72.0 in | Wt 199.9 lb

## 2021-03-30 DIAGNOSIS — C9001 Multiple myeloma in remission: Secondary | ICD-10-CM | POA: Diagnosis not present

## 2021-03-30 DIAGNOSIS — Z9221 Personal history of antineoplastic chemotherapy: Secondary | ICD-10-CM | POA: Diagnosis not present

## 2021-03-30 LAB — CBC WITH DIFFERENTIAL (CANCER CENTER ONLY)
Abs Immature Granulocytes: 0.06 10*3/uL (ref 0.00–0.07)
Basophils Absolute: 0.1 10*3/uL (ref 0.0–0.1)
Basophils Relative: 1 %
Eosinophils Absolute: 0.4 10*3/uL (ref 0.0–0.5)
Eosinophils Relative: 4 %
HCT: 35.9 % — ABNORMAL LOW (ref 39.0–52.0)
Hemoglobin: 12.3 g/dL — ABNORMAL LOW (ref 13.0–17.0)
Immature Granulocytes: 1 %
Lymphocytes Relative: 17 %
Lymphs Abs: 1.7 10*3/uL (ref 0.7–4.0)
MCH: 31.4 pg (ref 26.0–34.0)
MCHC: 34.3 g/dL (ref 30.0–36.0)
MCV: 91.6 fL (ref 80.0–100.0)
Monocytes Absolute: 0.6 10*3/uL (ref 0.1–1.0)
Monocytes Relative: 6 %
Neutro Abs: 7.1 10*3/uL (ref 1.7–7.7)
Neutrophils Relative %: 71 %
Platelet Count: 266 10*3/uL (ref 150–400)
RBC: 3.92 MIL/uL — ABNORMAL LOW (ref 4.22–5.81)
RDW: 14.6 % (ref 11.5–15.5)
WBC Count: 9.9 10*3/uL (ref 4.0–10.5)
nRBC: 0 % (ref 0.0–0.2)

## 2021-03-30 LAB — CMP (CANCER CENTER ONLY)
ALT: 17 U/L (ref 0–44)
AST: 14 U/L — ABNORMAL LOW (ref 15–41)
Albumin: 3.8 g/dL (ref 3.5–5.0)
Alkaline Phosphatase: 83 U/L (ref 38–126)
Anion gap: 13 (ref 5–15)
BUN: 14 mg/dL (ref 8–23)
CO2: 23 mmol/L (ref 22–32)
Calcium: 9.3 mg/dL (ref 8.9–10.3)
Chloride: 101 mmol/L (ref 98–111)
Creatinine: 1.43 mg/dL — ABNORMAL HIGH (ref 0.61–1.24)
GFR, Estimated: 50 mL/min — ABNORMAL LOW (ref >60)
Glucose, Bld: 169 mg/dL — ABNORMAL HIGH (ref 70–99)
Potassium: 3.8 mmol/L (ref 3.5–5.1)
Sodium: 137 mmol/L (ref 135–145)
Total Bilirubin: 0.4 mg/dL (ref 0.3–1.2)
Total Protein: 6.9 g/dL (ref 6.5–8.1)

## 2021-03-30 NOTE — Progress Notes (Signed)
Hematology and Oncology Follow Up Visit  James [name] [id number] Aug 18, 1945 75 y.o. 03/30/2021 9:20 AM Pcp, Bunnie Domino, MD   Principle Diagnosis: 75 year old man with multiple myeloma presented with 30% plasma cells involvement in the bone marrow in 2017.  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   Prior therapy:  Revlimid and dexamethasone started on January 28 2016. Revlimid at 25 mg daily for 21 days with dexamethasone at 20 mg weekly. Therapy discontinued in January 2018 after achieving a complete response.  He developed relapsed disease in April 2020 with M spike of 3.9 g/dL IgM level of 4707.  Current therapy:   Revlimid 15 mg daily for 21 days out of a 28 day cycle with dexamethasone 40 mg weekly started in May 2020.  He achieved a complete response to therapy in May 2021.  Revlimid 5 mg daily 3 weeks on 1 week off started in May 2021.  Therapy discontinued in June 2021.  Revlimid 15 mg daily with dexamethasone restarted in June 2021.   Interim History:  James [name] returns today for repeat evaluation.  Since the last visit, he reports no major changes in his health.  He underwent vocal cord surgery which has improved his voice.  His biopsy did not show any malignancy.  He continues to tolerate Revlimid without any issues at this time.  He denies any nausea, vomiting or fatigue.  He still able to ambulate without any major difficulties.                 .    Medications: Reviewed without changes.  Current Outpatient Medications  Medication Sig Dispense Refill   albuterol (VENTOLIN HFA) 108 (90 Base) MCG/ACT inhaler Inhale 1-2 puffs into the lungs every 6 (six) hours as needed for wheezing or shortness of breath.     amLODipine (NORVASC) 10 MG tablet Take 10 mg by mouth every morning.      aspirin-sod bicarb-citric acid (ALKA-SELTZER) 325 MG TBEF tablet Take 650 mg by mouth every 6 (six) hours as needed (indigestion / headaches).     bismuth  subsalicylate (PEPTO BISMOL) 262 MG/15ML suspension Take 30 mLs by mouth every 6 (six) hours as needed for indigestion or diarrhea or loose stools.     Calcium Carbonate-Vitamin D (CALCIUM 600+D PO) Take 1 tablet by mouth in the morning and at bedtime.     colestipol (COLESTID) 1 g tablet Take 1 g by mouth 2 (two) times daily.     dexamethasone (DECADRON) 4 MG tablet TAKE 5 TABLETS BY MOUTH ONCE A WEEK 64 tablet 0   diphenhydrAMINE HCl (ZZZQUIL) 50 MG/30ML LIQD Take 30 mLs by mouth at bedtime as needed (sleep).     Ferrous Fumarate (HEMOCYTE - 106 MG FE) 324 (106 Fe) MG TABS tablet Take 1 tablet by mouth 2 (two) times daily.     finasteride (PROPECIA) 1 MG tablet Take 1 mg by mouth in the morning.     gabapentin (NEURONTIN) 400 MG capsule Take 400-800 mg by mouth See admin instructions. Take 453m at lunch time and  2 Capsules (800 mg) at bedtime     hydrochlorothiazide (HYDRODIURIL) 25 MG tablet Take 25 mg by mouth in the morning.     HYDROcodone-acetaminophen (NORCO) 10-325 MG tablet Take 1 tablet by mouth in the morning, at noon, and at bedtime.     HYSINGLA ER 60 MG T24A Take 60 mg by mouth in the morning.     insulin degludec (TRESIBA FLEXTOUCH)  200 UNIT/ML FlexTouch Pen Inject 30 Units into the skin daily after supper.     lenalidomide (REVLIMID) 15 MG capsule TAKE 1 CAPSULE BY MOUTH EVERY DAY FOR 21 DAYS ON THEN 7 DAYS OFF. REPEAT EVERY 28 DAYS 21 capsule 0   losartan (COZAAR) 50 MG tablet Take 50 mg by mouth every morning.      Oxycodone HCl 20 MG TABS Take 20 mg by mouth every 8 (eight) hours as needed (pain.).     pantoprazole (PROTONIX) 40 MG tablet Take 40 mg by mouth 2 (two) times daily.     pioglitazone (ACTOS) 15 MG tablet Take 15 mg by mouth in the morning.     RESTASIS 0.05 % ophthalmic emulsion Place 1 drop into both eyes 2 (two) times daily as needed for dry eyes.     Tamsulosin HCl (FLOMAX) 0.4 MG CAPS Take 0.4 mg by mouth 2 (two) times daily.      tiZANidine (ZANAFLEX) 4 MG  tablet Take 4-8 mg by mouth See admin instructions. Take 1 tablet (4 mg) by mouth in the morning & take 2 tablets (8 mg) by mouth at night.     traMADol (ULTRAM) 50 MG tablet Take 50 mg by mouth 2 (two) times daily.     TRESIBA FLEXTOUCH 100 UNIT/ML FlexTouch Pen Inject 30 Units into the skin daily.     vitamin B-12 (CYANOCOBALAMIN) 1000 MCG tablet Take 1,000 mcg by mouth in the morning and at bedtime.     No current facility-administered medications for this visit.    Allergies:  Allergies  Allergen Reactions   Invokana [Canagliflozin] Anaphylaxis    Facial/neck/ lip swelling      Physical Exam:        Blood pressure (!) 133/57, pulse 98, temperature 97.7 F (36.5 C), temperature source Oral, resp. rate 19, height 6' (1.829 m), weight 199 lb 14.4 oz (90.7 kg), SpO2 99 %.          ECOG: 1     General appearance: Comfortable appearing without any discomfort Head: Normocephalic without any trauma Oropharynx: Mucous membranes are moist and pink without any thrush or ulcers. Eyes: Pupils are equal and round reactive to light. Lymph nodes: No cervical, supraclavicular, inguinal or axillary lymphadenopathy.   Heart:regular rate and rhythm.  S1 and S2 without leg edema. Lung: Clear without any rhonchi or wheezes.  No dullness to percussion. Abdomin: Soft, nontender, nondistended with good bowel sounds.  No hepatosplenomegaly. Musculoskeletal: No joint deformity or effusion.  Full range of motion noted. Neurological: No deficits noted on motor, sensory and deep tendon reflex exam. Skin: No petechial rash or dryness.  Appeared moist.          .      Lab Results: Lab Results  Component Value Date   WBC 5.9 02/16/2021   HGB 12.5 (L) 02/16/2021   HCT 38.6 (L) 02/16/2021   MCV 97.2 02/16/2021   PLT 332 02/16/2021     Chemistry      Component Value Date/Time   NA 134 (L) 02/16/2021 1530   NA 138 06/26/2017 0946   K 5.5 (H) 02/16/2021 1530   K 3.8  06/26/2017 0946   CL 98 02/16/2021 1530   CL 105 08/09/2012 0918   CO2 25 02/16/2021 1530   CO2 25 06/26/2017 0946   BUN 18 02/16/2021 1530   BUN 16.4 06/26/2017 0946   CREATININE 1.30 (H) 02/16/2021 1530   CREATININE 1.35 (H) 12/08/2020 1010   CREATININE 1.2 06/26/2017  0998      Component Value Date/Time   CALCIUM 9.2 02/16/2021 1530   CALCIUM 9.3 06/26/2017 0946   ALKPHOS 74 02/16/2021 1530   ALKPHOS 75 06/26/2017 0946   AST 22 02/16/2021 1530   AST 12 (L) 12/08/2020 1010   AST 11 06/26/2017 0946   ALT 15 02/16/2021 1530   ALT 12 12/08/2020 1010   ALT 14 06/26/2017 0946   BILITOT 0.3 02/16/2021 1530   BILITOT 0.5 12/08/2020 1010   BILITOT 0.31 06/26/2017 0946      Results for Goodie, TYLER CUBIT (MRN [id number]) as of 03/30/2021 09:21  Ref. Range 09/02/2020 11:38 12/08/2020 10:11  M Protein SerPl Elph-Mcnc Latest Ref Range: Not Observed g/dL Not Observed Not Observed  IFE 1 Unknown Comment Comment  Globulin, Total Latest Ref Range: 2.2 - 3.9 g/dL 2.7 2.8  B-Globulin SerPl Elph-Mcnc Latest Ref Range: 0.7 - 1.3 g/dL 1.1 1.1  IgG (Immunoglobin G), Serum Latest Ref Range: 603 - 1,613 mg/dL 531 (L) 536 (L)  IgM (Immunoglobulin M), Srm Latest Ref Range: 15 - 143 mg/dL 12 (L) 11 (L)  IgA Latest Ref Range: 61 - 437 mg/dL 174 164   Results for XYON, LUKASIK (MRN [id number]) as of 03/30/2021 09:21  Ref. Range 12/08/2020 10:10  Kappa free light chain Latest Ref Range: 3.3 - 19.4 mg/L 23.6 (H)  Lambda free light chains Latest Ref Range: 5.7 - 26.3 mg/L 15.4  Kappa, lambda light chain ratio Latest Ref Range: 0.26 - 1.65  1.53   Impression and Plan:  75 year old with:  1.   Multiple myeloma diagnosed in 2017.  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  He has tolerated Revlimid without any major complaints with disease under excellent control.  Protein studies in May 2022 was personally reviewed and continues to show M spike that is less than 1 g/dL.  He remains asymptomatic  from his disease at this time.  Risks and benefits of continuing this treatment were discussed.  Complications that include nausea, fatigue skin rash were reiterated.  Alternative treatment options including Velcade based therapy, daratumumab among others were reviewed.  He is agreeable to continue at this time.   2.  Chronic pain: Pain is manageable at this time.  This is multifactorial in nature related to multiple myeloma and arthritis.  3.  Anemia: Very mild and asymptomatic.  This is related to his plasma cell disorder.  Hemoglobin up from today is close to normal range.  4.  Hypokalemia: His potassium on February 24, 2021 was slightly elevated.  Repeated potassium today is within normal range.    5.  Follow-up: He will return in 3 months for repeat follow-up.  30  minutes were spent on this visit.  The time was dedicated to reviewing laboratory data, disease status update, outlining future plan of care and addressing complications related to his therapy.   Zola Button, MD 8/31/20229:20 AM

## 2021-03-31 ENCOUNTER — Other Ambulatory Visit: Payer: Self-pay | Admitting: Oncology

## 2021-03-31 DIAGNOSIS — C9001 Multiple myeloma in remission: Secondary | ICD-10-CM

## 2021-03-31 LAB — KAPPA/LAMBDA LIGHT CHAINS
Kappa free light chain: 37.3 mg/L — ABNORMAL HIGH (ref 3.3–19.4)
Kappa, lambda light chain ratio: 1.85 — ABNORMAL HIGH (ref 0.26–1.65)
Lambda free light chains: 20.2 mg/L (ref 5.7–26.3)

## 2021-04-05 LAB — MULTIPLE MYELOMA PANEL, SERUM
Albumin SerPl Elph-Mcnc: 3.6 g/dL (ref 2.9–4.4)
Albumin/Glob SerPl: 1.4 (ref 0.7–1.7)
Alpha 1: 0.3 g/dL (ref 0.0–0.4)
Alpha2 Glob SerPl Elph-Mcnc: 0.9 g/dL (ref 0.4–1.0)
B-Globulin SerPl Elph-Mcnc: 1 g/dL (ref 0.7–1.3)
Gamma Glob SerPl Elph-Mcnc: 0.5 g/dL (ref 0.4–1.8)
Globulin, Total: 2.7 g/dL (ref 2.2–3.9)
IgA: 157 mg/dL (ref 61–437)
IgG (Immunoglobin G), Serum: 559 mg/dL — ABNORMAL LOW (ref 603–1613)
IgM (Immunoglobulin M), Srm: 14 mg/dL — ABNORMAL LOW (ref 15–143)
Total Protein ELP: 6.3 g/dL (ref 6.0–8.5)

## 2021-04-07 ENCOUNTER — Other Ambulatory Visit: Payer: Self-pay | Admitting: Oncology

## 2021-04-23 ENCOUNTER — Other Ambulatory Visit: Payer: Self-pay | Admitting: Oncology

## 2021-04-23 DIAGNOSIS — C9001 Multiple myeloma in remission: Secondary | ICD-10-CM

## 2021-04-25 DIAGNOSIS — R0781 Pleurodynia: Secondary | ICD-10-CM | POA: Diagnosis not present

## 2021-04-25 DIAGNOSIS — E118 Type 2 diabetes mellitus with unspecified complications: Secondary | ICD-10-CM | POA: Diagnosis not present

## 2021-04-25 DIAGNOSIS — I152 Hypertension secondary to endocrine disorders: Secondary | ICD-10-CM | POA: Diagnosis not present

## 2021-04-25 DIAGNOSIS — E1169 Type 2 diabetes mellitus with other specified complication: Secondary | ICD-10-CM | POA: Diagnosis not present

## 2021-04-28 ENCOUNTER — Other Ambulatory Visit: Payer: Self-pay | Admitting: *Deleted

## 2021-04-28 DIAGNOSIS — C9001 Multiple myeloma in remission: Secondary | ICD-10-CM

## 2021-04-28 MED ORDER — LENALIDOMIDE 15 MG PO CAPS: ORAL_CAPSULE | ORAL | 0 refills | Status: DC

## 2021-05-03 DIAGNOSIS — R0781 Pleurodynia: Secondary | ICD-10-CM | POA: Diagnosis not present

## 2021-05-03 DIAGNOSIS — J449 Chronic obstructive pulmonary disease, unspecified: Secondary | ICD-10-CM | POA: Diagnosis not present

## 2021-05-03 DIAGNOSIS — R0602 Shortness of breath: Secondary | ICD-10-CM | POA: Diagnosis not present

## 2021-05-09 DIAGNOSIS — M47816 Spondylosis without myelopathy or radiculopathy, lumbar region: Secondary | ICD-10-CM | POA: Diagnosis not present

## 2021-05-09 DIAGNOSIS — M961 Postlaminectomy syndrome, not elsewhere classified: Secondary | ICD-10-CM | POA: Diagnosis not present

## 2021-05-09 DIAGNOSIS — M48062 Spinal stenosis, lumbar region with neurogenic claudication: Secondary | ICD-10-CM | POA: Diagnosis not present

## 2021-05-11 DIAGNOSIS — M47816 Spondylosis without myelopathy or radiculopathy, lumbar region: Secondary | ICD-10-CM | POA: Diagnosis not present

## 2021-05-12 ENCOUNTER — Telehealth: Payer: Self-pay | Admitting: *Deleted

## 2021-05-12 NOTE — Telephone Encounter (Signed)
Patient called. His pharmacy for Revlimid ([facility]) sent him a letter informing him that Lenalidomide is or will be available as generic.  He said  it reads as though they will be asking him to choose which one he wants when it is available.  He wants to know Dr. Hazeline [name] opinion as he would like to stay on name brand Revlimild as long as he can.  Dr. Alen Blew informed. Contacted patient with Dr. Hazeline [name] response: Prefer he stays with the brand name Revlimid if possible.   Patient states he is in agreement and will notify office if anything changes with his insurance/pharmacy

## 2021-05-19 ENCOUNTER — Other Ambulatory Visit: Payer: Self-pay | Admitting: Oncology

## 2021-05-19 DIAGNOSIS — C9001 Multiple myeloma in remission: Secondary | ICD-10-CM

## 2021-05-25 DIAGNOSIS — H16223 Keratoconjunctivitis sicca, not specified as Sjogren's, bilateral: Secondary | ICD-10-CM | POA: Diagnosis not present

## 2021-05-25 DIAGNOSIS — H02885 Meibomian gland dysfunction left lower eyelid: Secondary | ICD-10-CM | POA: Diagnosis not present

## 2021-05-25 DIAGNOSIS — H40013 Open angle with borderline findings, low risk, bilateral: Secondary | ICD-10-CM | POA: Diagnosis not present

## 2021-05-25 DIAGNOSIS — H02884 Meibomian gland dysfunction left upper eyelid: Secondary | ICD-10-CM | POA: Diagnosis not present

## 2021-05-25 DIAGNOSIS — H02882 Meibomian gland dysfunction right lower eyelid: Secondary | ICD-10-CM | POA: Diagnosis not present

## 2021-05-25 DIAGNOSIS — H0288A Meibomian gland dysfunction right eye, upper and lower eyelids: Secondary | ICD-10-CM | POA: Diagnosis not present

## 2021-05-25 DIAGNOSIS — H02881 Meibomian gland dysfunction right upper eyelid: Secondary | ICD-10-CM | POA: Diagnosis not present

## 2021-05-25 DIAGNOSIS — H0288B Meibomian gland dysfunction left eye, upper and lower eyelids: Secondary | ICD-10-CM | POA: Diagnosis not present

## 2021-05-25 DIAGNOSIS — E119 Type 2 diabetes mellitus without complications: Secondary | ICD-10-CM | POA: Diagnosis not present

## 2021-05-25 DIAGNOSIS — Z7961 Long term (current) use of immunomodulator: Secondary | ICD-10-CM | POA: Diagnosis not present

## 2021-06-10 DIAGNOSIS — Z0100 Encounter for examination of eyes and vision without abnormal findings: Secondary | ICD-10-CM | POA: Diagnosis not present

## 2021-06-11 ENCOUNTER — Other Ambulatory Visit: Payer: Self-pay | Admitting: Oncology

## 2021-06-11 DIAGNOSIS — E1165 Type 2 diabetes mellitus with hyperglycemia: Secondary | ICD-10-CM | POA: Diagnosis not present

## 2021-06-11 DIAGNOSIS — C9001 Multiple myeloma in remission: Secondary | ICD-10-CM

## 2021-06-15 ENCOUNTER — Other Ambulatory Visit: Payer: Self-pay | Admitting: *Deleted

## 2021-06-15 DIAGNOSIS — C9001 Multiple myeloma in remission: Secondary | ICD-10-CM

## 2021-06-15 MED ORDER — LENALIDOMIDE 15 MG PO CAPS: ORAL_CAPSULE | ORAL | 0 refills | Status: DC

## 2021-06-28 DIAGNOSIS — Z87891 Personal history of nicotine dependence: Secondary | ICD-10-CM | POA: Diagnosis not present

## 2021-06-28 DIAGNOSIS — J3489 Other specified disorders of nose and nasal sinuses: Secondary | ICD-10-CM | POA: Diagnosis not present

## 2021-06-28 DIAGNOSIS — R490 Dysphonia: Secondary | ICD-10-CM | POA: Diagnosis not present

## 2021-06-29 ENCOUNTER — Other Ambulatory Visit (HOSPITAL_COMMUNITY): Payer: Self-pay

## 2021-06-29 DIAGNOSIS — G894 Chronic pain syndrome: Secondary | ICD-10-CM | POA: Diagnosis not present

## 2021-06-29 DIAGNOSIS — Z79891 Long term (current) use of opiate analgesic: Secondary | ICD-10-CM | POA: Diagnosis not present

## 2021-06-29 DIAGNOSIS — M898X8 Other specified disorders of bone, other site: Secondary | ICD-10-CM | POA: Diagnosis not present

## 2021-06-30 ENCOUNTER — Other Ambulatory Visit (HOSPITAL_COMMUNITY): Payer: Self-pay

## 2021-07-01 ENCOUNTER — Other Ambulatory Visit (HOSPITAL_COMMUNITY): Payer: Self-pay

## 2021-07-01 ENCOUNTER — Telehealth: Payer: Self-pay | Admitting: Pharmacy Technician

## 2021-07-04 NOTE — Telephone Encounter (Signed)
Oral Oncology Patient Advocate Encounter   Was successful in securing patient a $12,000 grant from [employer] (PAF) to provide copayment coverage for Revlimid.  This will keep the out of pocket expense at $0.     I have spoken with the patient.    The billing information is as follows and has been shared with [employer].   RxBin: [id number] PCN:  [id number] Member ID: [id number] Group ID: [id number] Dates of Eligibility: 01/02/21 through 07/01/22  Fund:  [employer] [facility] [place] Phone [phone] Fax [phone] 07/04/2021 12:39 PM

## 2021-07-13 ENCOUNTER — Other Ambulatory Visit: Payer: Self-pay | Admitting: Oncology

## 2021-07-21 ENCOUNTER — Other Ambulatory Visit: Payer: Self-pay | Admitting: Oncology

## 2021-07-21 DIAGNOSIS — C9001 Multiple myeloma in remission: Secondary | ICD-10-CM

## 2021-07-26 ENCOUNTER — Other Ambulatory Visit: Payer: Self-pay | Admitting: *Deleted

## 2021-07-26 DIAGNOSIS — J449 Chronic obstructive pulmonary disease, unspecified: Secondary | ICD-10-CM | POA: Diagnosis not present

## 2021-07-26 DIAGNOSIS — C9001 Multiple myeloma in remission: Secondary | ICD-10-CM

## 2021-07-26 DIAGNOSIS — E1169 Type 2 diabetes mellitus with other specified complication: Secondary | ICD-10-CM | POA: Diagnosis not present

## 2021-07-26 DIAGNOSIS — M5136 Other intervertebral disc degeneration, lumbar region: Secondary | ICD-10-CM | POA: Diagnosis not present

## 2021-07-26 DIAGNOSIS — E785 Hyperlipidemia, unspecified: Secondary | ICD-10-CM | POA: Diagnosis not present

## 2021-07-26 DIAGNOSIS — I152 Hypertension secondary to endocrine disorders: Secondary | ICD-10-CM | POA: Diagnosis not present

## 2021-07-26 DIAGNOSIS — E118 Type 2 diabetes mellitus with unspecified complications: Secondary | ICD-10-CM | POA: Diagnosis not present

## 2021-07-26 DIAGNOSIS — I10 Essential (primary) hypertension: Secondary | ICD-10-CM | POA: Diagnosis not present

## 2021-07-26 MED ORDER — LENALIDOMIDE 15 MG PO CAPS: ORAL_CAPSULE | ORAL | 0 refills | Status: DC

## 2021-07-28 ENCOUNTER — Inpatient Hospital Stay: Payer: Medicare PPO | Admitting: Oncology

## 2021-07-28 ENCOUNTER — Encounter: Payer: Self-pay | Admitting: *Deleted

## 2021-07-28 ENCOUNTER — Inpatient Hospital Stay: Payer: Medicare PPO | Attending: Oncology

## 2021-07-28 DIAGNOSIS — G893 Neoplasm related pain (acute) (chronic): Secondary | ICD-10-CM | POA: Insufficient documentation

## 2021-07-28 DIAGNOSIS — M1990 Unspecified osteoarthritis, unspecified site: Secondary | ICD-10-CM | POA: Insufficient documentation

## 2021-07-28 DIAGNOSIS — D649 Anemia, unspecified: Secondary | ICD-10-CM | POA: Insufficient documentation

## 2021-07-28 DIAGNOSIS — E876 Hypokalemia: Secondary | ICD-10-CM | POA: Insufficient documentation

## 2021-07-28 DIAGNOSIS — Z79899 Other long term (current) drug therapy: Secondary | ICD-10-CM | POA: Insufficient documentation

## 2021-07-28 DIAGNOSIS — C9000 Multiple myeloma not having achieved remission: Secondary | ICD-10-CM | POA: Insufficient documentation

## 2021-07-29 ENCOUNTER — Other Ambulatory Visit: Payer: Self-pay

## 2021-07-29 ENCOUNTER — Other Ambulatory Visit: Payer: Self-pay | Admitting: *Deleted

## 2021-07-29 ENCOUNTER — Inpatient Hospital Stay: Payer: Medicare PPO

## 2021-07-29 ENCOUNTER — Inpatient Hospital Stay: Payer: Medicare PPO | Admitting: Oncology

## 2021-07-29 VITALS — BP 135/66 | HR 85 | Temp 99.5°F | Resp 20 | Ht 72.0 in | Wt 204.2 lb

## 2021-07-29 DIAGNOSIS — E876 Hypokalemia: Secondary | ICD-10-CM | POA: Diagnosis not present

## 2021-07-29 DIAGNOSIS — C9000 Multiple myeloma not having achieved remission: Secondary | ICD-10-CM | POA: Diagnosis present

## 2021-07-29 DIAGNOSIS — M1990 Unspecified osteoarthritis, unspecified site: Secondary | ICD-10-CM | POA: Diagnosis not present

## 2021-07-29 DIAGNOSIS — C9001 Multiple myeloma in remission: Secondary | ICD-10-CM | POA: Diagnosis not present

## 2021-07-29 DIAGNOSIS — D649 Anemia, unspecified: Secondary | ICD-10-CM | POA: Diagnosis not present

## 2021-07-29 DIAGNOSIS — G893 Neoplasm related pain (acute) (chronic): Secondary | ICD-10-CM | POA: Diagnosis not present

## 2021-07-29 DIAGNOSIS — Z79899 Other long term (current) drug therapy: Secondary | ICD-10-CM | POA: Diagnosis not present

## 2021-07-29 LAB — CBC WITH DIFFERENTIAL (CANCER CENTER ONLY)
Abs Immature Granulocytes: 0.04 10*3/uL (ref 0.00–0.07)
Basophils Absolute: 0 10*3/uL (ref 0.0–0.1)
Basophils Relative: 0 %
Eosinophils Absolute: 0 10*3/uL (ref 0.0–0.5)
Eosinophils Relative: 0 %
HCT: 35.5 % — ABNORMAL LOW (ref 39.0–52.0)
Hemoglobin: 11.9 g/dL — ABNORMAL LOW (ref 13.0–17.0)
Immature Granulocytes: 0 %
Lymphocytes Relative: 12 %
Lymphs Abs: 1.1 10*3/uL (ref 0.7–4.0)
MCH: 30.9 pg (ref 26.0–34.0)
MCHC: 33.5 g/dL (ref 30.0–36.0)
MCV: 92.2 fL (ref 80.0–100.0)
Monocytes Absolute: 1 10*3/uL (ref 0.1–1.0)
Monocytes Relative: 11 %
Neutro Abs: 7.1 10*3/uL (ref 1.7–7.7)
Neutrophils Relative %: 77 %
Platelet Count: 189 10*3/uL (ref 150–400)
RBC: 3.85 MIL/uL — ABNORMAL LOW (ref 4.22–5.81)
RDW: 15.4 % (ref 11.5–15.5)
WBC Count: 9.2 10*3/uL (ref 4.0–10.5)
nRBC: 0 % (ref 0.0–0.2)

## 2021-07-29 LAB — CMP (CANCER CENTER ONLY)
ALT: 14 U/L (ref 0–44)
AST: 9 U/L — ABNORMAL LOW (ref 15–41)
Albumin: 4 g/dL (ref 3.5–5.0)
Alkaline Phosphatase: 62 U/L (ref 38–126)
Anion gap: 11 (ref 5–15)
BUN: 22 mg/dL (ref 8–23)
CO2: 24 mmol/L (ref 22–32)
Calcium: 9.2 mg/dL (ref 8.9–10.3)
Chloride: 100 mmol/L (ref 98–111)
Creatinine: 1.15 mg/dL (ref 0.61–1.24)
GFR, Estimated: >60 mL/min (ref >60)
Glucose, Bld: 232 mg/dL — ABNORMAL HIGH (ref 70–99)
Potassium: 3.7 mmol/L (ref 3.5–5.1)
Sodium: 135 mmol/L (ref 135–145)
Total Bilirubin: 0.5 mg/dL (ref 0.3–1.2)
Total Protein: 6.7 g/dL (ref 6.5–8.1)

## 2021-07-29 MED ORDER — LENALIDOMIDE 15 MG PO CAPS: ORAL_CAPSULE | ORAL | 0 refills | Status: DC

## 2021-07-29 NOTE — Progress Notes (Signed)
Hematology and Oncology Follow Up Visit  James [name] [id number] [birth year] 75 y.o. 07/29/2021 9:11 AM Pcp, NoNo ref. provider found   Principle Diagnosis: 75 year old man with IgA kappa multiple myeloma diagnosed in 2017.  He presented with 30% plasma cells involvement in the bone marrow.   Prior therapy:  Revlimid and dexamethasone started on January 28 2016. Revlimid at 25 mg daily for 21 days with dexamethasone at 20 mg weekly. Therapy discontinued in January 2018 after achieving a complete response.  He developed relapsed disease in April 2020 with M spike of 3.9 g/dL IgM level of 4707.  Current therapy:   Revlimid 15 mg daily for 21 days out of a 28 day cycle with dexamethasone 40 mg weekly started in May 2020.  He achieved a complete response to therapy in May 2021.  Revlimid 5 mg daily 3 weeks on 1 week off started in May 2021.  Therapy discontinued in June 2021.  Revlimid 15 mg daily with dexamethasone restarted in June 2021.   Interim History:  James [name] is here for a follow-up visit.  Since the last visit, he reports no major changes in his health.  He denies any recent hospitalizations or illnesses.  He denies any complications related to Revlimid.  He denies any skin rash or GI toxicities.  He continues to have chronic pain issues currently under evaluation for possible pain management clinic.  He denies any pathological fractures or falls.  He still ambulates with the help of a cane.                 .    Medications: Updated on review  Current Outpatient Medications  Medication Sig Dispense Refill   albuterol (VENTOLIN HFA) 108 (90 Base) MCG/ACT inhaler Inhale 1-2 puffs into the lungs every 6 (six) hours as needed for wheezing or shortness of breath.     amLODipine (NORVASC) 10 MG tablet Take 10 mg by mouth every morning.      aspirin-sod bicarb-citric acid (ALKA-SELTZER) 325 MG TBEF tablet Take 650 mg by mouth every 6 (six) hours as needed  (indigestion / headaches).     bismuth subsalicylate (PEPTO BISMOL) 262 MG/15ML suspension Take 30 mLs by mouth every 6 (six) hours as needed for indigestion or diarrhea or loose stools.     Calcium Carbonate-Vitamin D (CALCIUM 600+D PO) Take 1 tablet by mouth in the morning and at bedtime.     colestipol (COLESTID) 1 g tablet Take 1 g by mouth 2 (two) times daily.     dexamethasone (DECADRON) 4 MG tablet TAKE 5 TABLETS BY MOUTH ONCE A WEEK 64 tablet 0   diphenhydrAMINE HCl (ZZZQUIL) 50 MG/30ML LIQD Take 30 mLs by mouth at bedtime as needed (sleep).     Ferrous Fumarate (HEMOCYTE - 106 MG FE) 324 (106 Fe) MG TABS tablet Take 1 tablet by mouth 2 (two) times daily.     finasteride (PROPECIA) 1 MG tablet Take 1 mg by mouth in the morning.     gabapentin (NEURONTIN) 400 MG capsule Take 400-800 mg by mouth See admin instructions. Take 435m at lunch time and  2 Capsules (800 mg) at bedtime     hydrochlorothiazide (HYDRODIURIL) 25 MG tablet Take 25 mg by mouth in the morning.     HYDROcodone-acetaminophen (NORCO) 10-325 MG tablet Take 1 tablet by mouth in the morning, at noon, and at bedtime.     HYSINGLA ER 60 MG T24A Take 60 mg by mouth in the morning.  insulin degludec (TRESIBA FLEXTOUCH) 200 UNIT/ML FlexTouch Pen Inject 30 Units into the skin daily after supper.     lenalidomide (REVLIMID) 15 MG capsule [employer] Auth # [id number]     Date Obtained 06/15/21 21 capsule 0   lenalidomide (REVLIMID) 15 MG capsule TAKE 1 CAPSULE BY MOUTH EVERY DAY FOR 21 DAYS, THEN OFF FOR 7 DAYS EVERY 28 DAY CYCLE 21 capsule 0   losartan (COZAAR) 50 MG tablet Take 50 mg by mouth every morning.      Oxycodone HCl 20 MG TABS Take 20 mg by mouth every 8 (eight) hours as needed (pain.).     pantoprazole (PROTONIX) 40 MG tablet Take 40 mg by mouth 2 (two) times daily.     pioglitazone (ACTOS) 15 MG tablet Take 15 mg by mouth in the morning.     RESTASIS 0.05 % ophthalmic emulsion Place 1 drop into both eyes 2 (two) times daily  as needed for dry eyes.     Tamsulosin HCl (FLOMAX) 0.4 MG CAPS Take 0.4 mg by mouth 2 (two) times daily.      tiZANidine (ZANAFLEX) 4 MG tablet Take 4-8 mg by mouth See admin instructions. Take 1 tablet (4 mg) by mouth in the morning & take 2 tablets (8 mg) by mouth at night.     traMADol (ULTRAM) 50 MG tablet Take 50 mg by mouth 2 (two) times daily.     TRESIBA FLEXTOUCH 100 UNIT/ML FlexTouch Pen Inject 30 Units into the skin daily.     vitamin B-12 (CYANOCOBALAMIN) 1000 MCG tablet Take 1,000 mcg by mouth in the morning and at bedtime.     No current facility-administered medications for this visit.    Allergies:  Allergies  Allergen Reactions   Invokana [Canagliflozin] Anaphylaxis    Facial/neck/ lip swelling   Ace Inhibitors Cough      Physical Exam: Blood pressure 135/66, pulse 85, temperature 99.5 F (37.5 C), temperature source Temporal, resp. rate 20, height 6' (1.829 m), weight 204 lb 3.2 oz (92.6 kg), SpO2 98 %.   ECOG: 1    General appearance: Alert, awake without any distress. Head: Atraumatic without abnormalities Oropharynx: Without any thrush or ulcers. Eyes: No scleral icterus. Lymph nodes: No lymphadenopathy noted in the cervical, supraclavicular, or axillary nodes Heart:regular rate and rhythm, without any murmurs or gallops.   Lung: Clear to auscultation without any rhonchi, wheezes or dullness to percussion. Abdomin: Soft, nontender without any shifting dullness or ascites. Musculoskeletal: No clubbing or cyanosis. Neurological: No motor or sensory deficits. Skin: No rashes or lesions.          .      Lab Results: Lab Results  Component Value Date   WBC 9.9 03/30/2021   HGB 12.3 (L) 03/30/2021   HCT 35.9 (L) 03/30/2021   MCV 91.6 03/30/2021   PLT 266 03/30/2021     Chemistry      Component Value Date/Time   NA 137 03/30/2021 0922   NA 138 06/26/2017 0946   K 3.8 03/30/2021 0922   K 3.8 06/26/2017 0946   CL 101 03/30/2021 0922    CL 105 08/09/2012 0918   CO2 23 03/30/2021 0922   CO2 25 06/26/2017 0946   BUN 14 03/30/2021 0922   BUN 16.4 06/26/2017 0946   CREATININE 1.43 (H) 03/30/2021 0922   CREATININE 1.2 06/26/2017 0946      Component Value Date/Time   CALCIUM 9.3 03/30/2021 0922   CALCIUM 9.3 06/26/2017 0946   ALKPHOS 83  03/30/2021 0922   ALKPHOS 75 06/26/2017 0946   AST 14 (L) 03/30/2021 0922   AST 11 06/26/2017 0946   ALT 17 03/30/2021 0922   ALT 14 06/26/2017 0946   BILITOT 0.4 03/30/2021 0922   BILITOT 0.31 06/26/2017 0946       Latest Reference Range & Units 03/30/21 09:22 03/30/21 09:23  IgG (Immunoglobin G), Serum 603 - 1,613 mg/dL  559 (L)  IgM (Immunoglobulin M), Srm 15 - 143 mg/dL  14 (L)  IgA 61 - 437 mg/dL  157  Kappa free light chain 3.3 - 19.4 mg/L 37.3 (H)   Lambda free light chains 5.7 - 26.3 mg/L 20.2   Kappa, lambda light chain ratio 0.26 - 1.65  1.85 (H)     Latest Reference Range & Units 12/08/20 10:11 03/30/21 09:23  M Protein SerPl Elph-Mcnc Not Observed g/dL Not Observed (C) Not Observed (C)  IFE 1  Comment (C) Comment (C)  Globulin, Total 2.2 - 3.9 g/dL 2.8 (C) 2.7 (C)  B-Globulin SerPl Elph-Mcnc 0.7 - 1.3 g/dL 1.1 (C) 1.0 (C)  IgG (Immunoglobin G), Serum 603 - 1,613 mg/dL 536 (L) 559 (L)  IgM (Immunoglobulin M), Srm 15 - 143 mg/dL 11 (L) 14 (L)  IgA 61 - 437 mg/dL 164 157  (L): Data is abnormally low (C): Corrected    Impression and Plan:  75 year old with:  1.   IgA kappa multiple myeloma diagnosed in 2017.    His disease status was updated at this time and treatment choices were reviewed.  He continues to tolerate Revlimid with excellent response currently.  He continues to be in remission without any need for additional therapy.  Risks and benefits of continuing this treatment were discussed at this time.  Myelosuppression, dermatological toxicity as well as GI issues were reviewed.  He is agreeable to continue at this time.   2.  Chronic pain: Related to  multiple myeloma as well as arthritis.  Pain is manageable.  3.  Anemia: His hemoglobin today is 11.9 close to normal range.  He is asymptomatic and does not require any intervention.  4.  Hypokalemia: Related to Revlimid and will continue to monitor and replace as needed.    5.  Follow-up: In 3 months for a follow-up evaluation.  30  minutes were dedicated to this encounter.  The time was spent on reviewing laboratory data, disease status update, treatment choices and future plan of care review.   Zola Button, MD 12/30/20229:11 AM

## 2021-08-02 ENCOUNTER — Telehealth: Payer: Self-pay | Admitting: Oncology

## 2021-08-02 LAB — KAPPA/LAMBDA LIGHT CHAINS
Kappa free light chain: 17 mg/L (ref 3.3–19.4)
Kappa, lambda light chain ratio: 1.68 — ABNORMAL HIGH (ref 0.26–1.65)
Lambda free light chains: 10.1 mg/L (ref 5.7–26.3)

## 2021-08-02 NOTE — Telephone Encounter (Signed)
Scheduled per 12/20 los, pt has been called and is aware of appt, will also mail calender per pt request

## 2021-08-04 LAB — MULTIPLE MYELOMA PANEL, SERUM
Albumin SerPl Elph-Mcnc: 3.9 g/dL (ref 2.9–4.4)
Albumin/Glob SerPl: 1.7 (ref 0.7–1.7)
Alpha 1: 0.2 g/dL (ref 0.0–0.4)
Alpha2 Glob SerPl Elph-Mcnc: 0.9 g/dL (ref 0.4–1.0)
B-Globulin SerPl Elph-Mcnc: 0.9 g/dL (ref 0.7–1.3)
Gamma Glob SerPl Elph-Mcnc: 0.4 g/dL (ref 0.4–1.8)
Globulin, Total: 2.4 g/dL (ref 2.2–3.9)
IgA: 117 mg/dL (ref 61–437)
IgG (Immunoglobin G), Serum: 502 mg/dL — ABNORMAL LOW (ref 603–1613)
IgM (Immunoglobulin M), Srm: 13 mg/dL — ABNORMAL LOW (ref 15–143)
Total Protein ELP: 6.3 g/dL (ref 6.0–8.5)

## 2021-08-19 ENCOUNTER — Other Ambulatory Visit (HOSPITAL_COMMUNITY): Payer: Self-pay

## 2021-08-19 ENCOUNTER — Other Ambulatory Visit: Payer: Self-pay | Admitting: Oncology

## 2021-08-19 DIAGNOSIS — C9001 Multiple myeloma in remission: Secondary | ICD-10-CM

## 2021-08-23 ENCOUNTER — Other Ambulatory Visit: Payer: Self-pay | Admitting: *Deleted

## 2021-08-23 DIAGNOSIS — C9001 Multiple myeloma in remission: Secondary | ICD-10-CM

## 2021-08-23 DIAGNOSIS — Z79891 Long term (current) use of opiate analgesic: Secondary | ICD-10-CM | POA: Diagnosis not present

## 2021-08-23 DIAGNOSIS — G8929 Other chronic pain: Secondary | ICD-10-CM | POA: Diagnosis not present

## 2021-08-23 DIAGNOSIS — Z79899 Other long term (current) drug therapy: Secondary | ICD-10-CM | POA: Diagnosis not present

## 2021-08-23 MED ORDER — LENALIDOMIDE 15 MG PO CAPS: ORAL_CAPSULE | ORAL | 0 refills | Status: DC

## 2021-08-24 ENCOUNTER — Other Ambulatory Visit: Payer: Self-pay | Admitting: *Deleted

## 2021-08-24 DIAGNOSIS — C9001 Multiple myeloma in remission: Secondary | ICD-10-CM

## 2021-08-24 MED ORDER — LENALIDOMIDE 15 MG PO CAPS: ORAL_CAPSULE | ORAL | 0 refills | Status: DC

## 2021-08-28 NOTE — Progress Notes (Signed)
Synopsis: Referred for COPD by Darylene Price, MD  Subjective:   PATIENT ID: James [name]. GENDER: male DOB: 1946/07/20,78 MRN: [id number]  Chief Complaint  Patient presents with   Pulmonary Consult    Referred by Dr. Chester Holstein. Pt c/o DOE for the past year. He was winded walking from lobby to exam room today.    76yM with history of IgA kappa MM with relapse now in remission on revlimid followed by Dr. 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 Been an issue for over 2 years. DOE with minimal activity - notes it even walking from waiting room. He doesn't have much cough at all, only occasional phlegm production. He doesn't think the papilloma excision helped or worsened his DOE. He doesn't have any orthopnea. He does have BLE swelling. He has been using trelegy for about 3 months and he's not convinced that it is helpful. He's also not convinced that his nebulizers help him much.  He's not sure if he snores. No PND. No morning headaches and not convinced he has excessive daytime sleepiness.   Otherwise pertinent review of systems is negative.  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 Didn't have any trouble breathing back then. Smoked for 30ish years, quit 1985, smoked crack for a little while.   Past Medical History:  Diagnosis Date   Anemia    Arthritis    BPH (benign prostatic hyperplasia)    CHF (congestive heart failure) (HCC)    Chronic pain    Diabetes mellitus    Dyspnea    GERD (gastroesophageal reflux disease)    Headache(784.0)    migraines, sinus headaches    Hepatitis    C and B-treated   HLD (hyperlipidemia)    Hypertension    Leukemia (HCC)    Lupus (HCC)    Multiple myeloma (HCC)    Pneumonia 07/2011   Sleep apnea 03/2018   going for fitting on 04-29-18   Thyroid disease    goiter   Urinary frequency      Family History  Problem Relation Age of Onset   Hyperlipidemia Mother    Hypertension Mother      Past Surgical History:  Procedure Laterality Date   ABDOMINAL SURGERY     BACK SURGERY     x 5    BIOPSY  01/02/2020   Procedure: BIOPSY;  Surgeon: Jeani Hawking, MD;  Location: [facility];  Service: Endoscopy;;   CHOLECYSTECTOMY     COLONOSCOPY     COLONOSCOPY WITH PROPOFOL N/A 01/02/2020   Procedure: COLONOSCOPY WITH PROPOFOL;  Surgeon: Jeani Hawking, MD;  Location: [facility];  Service: Endoscopy;  Laterality: N/A;   cyst removal skull  20 years ago   ETHMOIDECTOMY  10/12/2011   Procedure: ETHMOIDECTOMY;  Surgeon: Keturah Barre, MD;  Location: [facility];  Service: ENT;  Laterality: Bilateral;  bilateral maxillary sinus osteal enlargement, frontal sinusotomy   EYE SURGERY     bilat cataract with lens implants   HAND SURGERY     right finger   HERNIA REPAIR  MICROLARYNGOSCOPY N/A 02/18/2021   Procedure: MICROLARYNGOSCOPY with Excisional Biopsy;  Surgeon: Jerrell Belfast, MD;  Location: [place];  Service: ENT;  Laterality: N/A;   POLYPECTOMY  01/02/2020   Procedure: POLYPECTOMY;  Surgeon: Carol Ada, MD;  Location: [facility];  Service: Endoscopy;;   ROTATOR CUFF REPAIR     bilateral   SHOULDER ARTHROSCOPY WITH ROTATOR CUFF REPAIR Left 05/18/2014   Procedure: SHOULDER ARTHROSCOPY WITH ROTATOR CUFF REPAIR;  Surgeon: Nita Sells, MD;  Location: [place];  Service: Orthopedics;  Laterality: Left;  Left shoulder arthroscopy rotator cuff repair, subacromial decompression   sinus surgery     TONSILLECTOMY     TRIGGER FINGER RELEASE Right 05/02/2018   Procedure: RELEASE TRIGGER FINGER/A-1 PULLEY  RIGHT SMALL FINGER;  Surgeon: Leanora Cover, MD;  Location: [place];  Service: Orthopedics;  Laterality: Right;    Social History   Socioeconomic History   Marital status: Married    Spouse name: Not on file   Number of children: Not on file   Years of education: Not on file   Highest education level: Not on file  Occupational History   Not on file  Tobacco Use   Smoking status: Former    Packs/day: 1.00    Years: 26.00    Pack years: 26.00    Types: Cigarettes    Quit date: 05/12/1984    Years since quitting: 37.3   Smokeless tobacco: Never  Vaping Use   Vaping Use: Not on file  Substance and Sexual Activity   Alcohol use: No   Drug use: Yes    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 years ago   Sexual activity: Not Currently  Other Topics Concern   Not on file  Social History Narrative   Not on file   Social Determinants of Health   Financial Resource Strain: Not on file  Food Insecurity: Not on file  Transportation Needs: Not on file  Physical Activity: Not on file  Stress: Not on file  Social Connections: Not on file  Intimate Partner Violence: Not on file     Allergies  Allergen Reactions   Invokana [Canagliflozin] Anaphylaxis    Facial/neck/ lip swelling   Ace Inhibitors Cough     Outpatient Medications Prior to Visit  Medication Sig Dispense Refill   amLODipine (NORVASC) 10 MG tablet Take 10 mg by mouth every morning.      aspirin-sod bicarb-citric acid (ALKA-SELTZER) 325 MG TBEF tablet Take 650 mg by mouth every 6 (six) hours as needed (indigestion / headaches).     bismuth subsalicylate (PEPTO BISMOL) 262 MG/15ML suspension Take 30 mLs by mouth every 6 (six) hours as needed for indigestion or diarrhea or loose stools.     Calcium Carbonate-Vitamin D (CALCIUM 600+D PO) Take 1 tablet by mouth in the morning and at bedtime.     colestipol (COLESTID) 1 g tablet Take 1 g by mouth 2 (two) times daily.     dexamethasone (DECADRON) 4 MG tablet  TAKE 5 TABLETS BY MOUTH ONCE A WEEK 64 tablet 0   diphenhydrAMINE HCl (ZZZQUIL) 50 MG/30ML LIQD Take 30 mLs by mouth at bedtime as needed (sleep).     Ferrous Fumarate (HEMOCYTE - 106 MG FE) 324 (106 Fe) MG TABS tablet Take 1 tablet by mouth 2 (two) times daily.     finasteride (PROPECIA) 1 MG tablet Take 1 mg by mouth in the morning.     Fluticasone-Umeclidin-Vilant (TRELEGY ELLIPTA) 100-62.5-25 MCG/ACT AEPB  Inhale 1 puff into the lungs daily.     gabapentin (NEURONTIN) 400 MG capsule Take 400-800 mg by mouth See admin instructions. Take 400mg  at lunch time and  2 Capsules (800 mg) at bedtime     hydrochlorothiazide (HYDRODIURIL) 25 MG tablet Take 25 mg by mouth in the morning.     HYDROcodone-acetaminophen (NORCO) 10-325 MG tablet Take 1 tablet by mouth in the morning, at noon, and at bedtime.     HYSINGLA ER 60 MG T24A Take 60 mg by mouth in the morning.     insulin degludec (TRESIBA FLEXTOUCH) 200 UNIT/ML FlexTouch Pen Inject 30 Units into the skin daily after supper.     lenalidomide (REVLIMID) 15 MG capsule [employer] Auth # [id number]    Date Obtained 08/23/2021 21 capsule 0   losartan (COZAAR) 50 MG tablet Take 50 mg by mouth every morning.      Oxycodone HCl 20 MG TABS Take 20 mg by mouth every 8 (eight) hours as needed (pain.).     pantoprazole (PROTONIX) 40 MG tablet Take 40 mg by mouth 2 (two) times daily.     pioglitazone (ACTOS) 15 MG tablet Take 15 mg by mouth in the morning.     RESTASIS 0.05 % ophthalmic emulsion Place 1 drop into both eyes 2 (two) times daily as needed for dry eyes.     Tamsulosin HCl (FLOMAX) 0.4 MG CAPS Take 0.4 mg by mouth 2 (two) times daily.      tiZANidine (ZANAFLEX) 4 MG tablet Take 4-8 mg by mouth See admin instructions. Take 1 tablet (4 mg) by mouth in the morning & take 2 tablets (8 mg) by mouth at night.     traMADol (ULTRAM) 50 MG tablet Take 50 mg by mouth 2 (two) times daily.     TRESIBA FLEXTOUCH 100 UNIT/ML FlexTouch Pen Inject 30 Units into the skin  daily.     vitamin B-12 (CYANOCOBALAMIN) 1000 MCG tablet Take 1,000 mcg by mouth in the morning and at bedtime.     albuterol (VENTOLIN HFA) 108 (90 Base) MCG/ACT inhaler Inhale 1-2 puffs into the lungs every 6 (six) hours as needed for wheezing or shortness of breath.     No facility-administered medications prior to visit.       Objective:   Physical Exam:  General appearance: 76 y.o., male, NAD, conversant  Eyes: anicteric sclerae; PERRL, tracking appropriately HENT: NCAT; MMM Neck: Trachea midline; no lymphadenopathy, no JVD Lungs: faint expiratory wheeze L>R, no crackles, with normal respiratory effort CV: RRR, no murmur  Abdomen: Soft, non-tender; non-distended, BS present  Extremities: No peripheral edema, warm Skin: Normal turgor and texture; no rash Psych: Appropriate affect Neuro: Alert and oriented to person and place, no focal deficit     Vitals:   08/29/21 0907  BP: 134/68  Pulse: 99  Temp: 98.7 F (37.1 C)  TempSrc: Oral  SpO2: 97%  Weight: 212 lb 6.4 oz (96.3 kg)  Height: 6' (1.829 m)   97% on RA BMI Readings from Last 3 Encounters:  08/29/21 28.81 kg/m  07/29/21 27.69 kg/m  03/30/21 27.11 kg/m   Wt Readings from Last 3 Encounters:  08/29/21 212 lb 6.4 oz (96.3 kg)  07/29/21 204 lb 3.2 oz (92.6 kg)  03/30/21 199 lb 14.4 oz (90.7 kg)     CBC    Component Value Date/Time   WBC 9.2 07/29/2021 0910   WBC 5.9 02/16/2021 1530   RBC 3.85 (L) 07/29/2021 0910   HGB 11.9 (L)  07/29/2021 0910   HGB 13.4 06/26/2017 0946   HCT 35.5 (L) 07/29/2021 0910   HCT 40.8 06/26/2017 0946   PLT 189 07/29/2021 0910   PLT 236 06/26/2017 0946   MCV 92.2 07/29/2021 0910   MCV 90.7 06/26/2017 0946   MCH 30.9 07/29/2021 0910   MCHC 33.5 07/29/2021 0910   RDW 15.4 07/29/2021 0910   RDW 15.7 (H) 06/26/2017 0946   LYMPHSABS 1.1 07/29/2021 0910   LYMPHSABS 1.8 06/26/2017 0946   MONOABS 1.0 07/29/2021 0910   MONOABS 0.6 06/26/2017 0946   EOSABS 0.0 07/29/2021  0910   EOSABS 0.1 06/26/2017 0946   BASOSABS 0.0 07/29/2021 0910   BASOSABS 0.1 06/26/2017 0946    Eos 0-200   Chest Imaging:  CXR 2017 reviewed by me with mild r hemidiaphragm elevation, possible small left pleural effusion  Pulmonary Functions Testing Results: No flowsheet data found.    Echocardiogram:   TTE 10/16/18:  1. The left ventricle has normal systolic function with an ejection  fraction of 60-65%. The cavity size was normal. There is mildly increased  left ventricular wall thickness. Left ventricular diastolic parameters  were normal. There is abnormal septal  motion secondary to conduction delay.   2. The right ventricle has normal systolic function. The cavity was  normal. There is no increase in right ventricular wall thickness.   3. The aortic valve is grossly normal Mild calcification of the aortic  valve. Aortic valve regurgitation is trivial by color flow Doppler.   4. The ascending aorta is normal in size and structure.   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   # Likely COPD gold functional group B  # History of OSA not on CPAP  Plan: - BNP, TSH, IgE (has had recent cbc, BMP) - PFTs - home sleep study - continue trelegy 1 puff once daily, instructed in ideal use, rinse mouth afterward - albuterol prn     Maryjane Hurter, MD [facility] 08/29/2021 9:10 AM

## 2021-08-29 ENCOUNTER — Encounter: Payer: Self-pay | Admitting: Student

## 2021-08-29 ENCOUNTER — Ambulatory Visit (INDEPENDENT_AMBULATORY_CARE_PROVIDER_SITE_OTHER): Payer: Medicare HMO

## 2021-08-29 ENCOUNTER — Ambulatory Visit (INDEPENDENT_AMBULATORY_CARE_PROVIDER_SITE_OTHER): Payer: Medicare HMO | Admitting: Student

## 2021-08-29 ENCOUNTER — Other Ambulatory Visit: Payer: Self-pay

## 2021-08-29 VITALS — BP 134/68 | HR 99 | Temp 98.7°F | Ht 72.0 in | Wt 212.4 lb

## 2021-08-29 DIAGNOSIS — R0609 Other forms of dyspnea: Secondary | ICD-10-CM

## 2021-08-29 DIAGNOSIS — J449 Chronic obstructive pulmonary disease, unspecified: Secondary | ICD-10-CM | POA: Diagnosis not present

## 2021-08-29 LAB — TSH: TSH: 0.66 u[IU]/mL (ref 0.35–5.50)

## 2021-08-29 LAB — BRAIN NATRIURETIC PEPTIDE: Pro B Natriuretic peptide (BNP): 76 pg/mL (ref 0.0–100.0)

## 2021-08-29 NOTE — Patient Instructions (Signed)
-   you will be called to schedule your breathing tests (PFTs), sleep study - labs, x-ray today - keep using trelegy as you are 1 puff once daily, rinse mouth after use - see you in 6 weeks!

## 2021-08-30 DIAGNOSIS — E1165 Type 2 diabetes mellitus with hyperglycemia: Secondary | ICD-10-CM | POA: Diagnosis not present

## 2021-08-30 LAB — IGE: IgE (Immunoglobulin E), Serum: 26 kU/L (ref <=114)

## 2021-09-16 ENCOUNTER — Other Ambulatory Visit: Payer: Self-pay | Admitting: Oncology

## 2021-09-16 DIAGNOSIS — C9001 Multiple myeloma in remission: Secondary | ICD-10-CM

## 2021-09-20 DIAGNOSIS — M47816 Spondylosis without myelopathy or radiculopathy, lumbar region: Secondary | ICD-10-CM | POA: Diagnosis not present

## 2021-09-20 DIAGNOSIS — M48062 Spinal stenosis, lumbar region with neurogenic claudication: Secondary | ICD-10-CM | POA: Diagnosis not present

## 2021-09-20 DIAGNOSIS — M961 Postlaminectomy syndrome, not elsewhere classified: Secondary | ICD-10-CM | POA: Diagnosis not present

## 2021-09-27 DIAGNOSIS — R419 Unspecified symptoms and signs involving cognitive functions and awareness: Secondary | ICD-10-CM | POA: Diagnosis not present

## 2021-09-28 DIAGNOSIS — R419 Unspecified symptoms and signs involving cognitive functions and awareness: Secondary | ICD-10-CM | POA: Diagnosis not present

## 2021-09-29 DIAGNOSIS — R419 Unspecified symptoms and signs involving cognitive functions and awareness: Secondary | ICD-10-CM | POA: Diagnosis not present

## 2021-09-30 DIAGNOSIS — R419 Unspecified symptoms and signs involving cognitive functions and awareness: Secondary | ICD-10-CM | POA: Diagnosis not present

## 2021-10-01 DIAGNOSIS — R419 Unspecified symptoms and signs involving cognitive functions and awareness: Secondary | ICD-10-CM | POA: Diagnosis not present

## 2021-10-06 ENCOUNTER — Other Ambulatory Visit: Payer: Self-pay | Admitting: Oncology

## 2021-10-06 DIAGNOSIS — C9001 Multiple myeloma in remission: Secondary | ICD-10-CM

## 2021-10-07 ENCOUNTER — Other Ambulatory Visit: Payer: Self-pay | Admitting: Oncology

## 2021-10-07 DIAGNOSIS — C9001 Multiple myeloma in remission: Secondary | ICD-10-CM

## 2021-10-10 ENCOUNTER — Telehealth: Payer: Self-pay | Admitting: Cardiology

## 2021-10-10 NOTE — Telephone Encounter (Signed)
?*  STAT* If patient is at the pharmacy, call can be transferred to refill team. ? ? ?1. Which medications need to be refilled? (please list name of each medication and dose if known) Isosorbide ? ?2. Which pharmacy/location (including street and city if local pharmacy) is medication to be sent to?[place], [place] ? ?3. Do they need a 30 day or 90 day supply? Need enough his appointment on 12-22-21 ?

## 2021-10-11 ENCOUNTER — Telehealth: Payer: Self-pay | Admitting: *Deleted

## 2021-10-11 MED ORDER — ISOSORBIDE DINITRATE 40 MG PO TABS: 40.0000 mg | ORAL_TABLET | Freq: Two times a day (BID) | ORAL | 0 refills | Status: AC

## 2021-10-11 NOTE — Telephone Encounter (Signed)
Refills of Isosorbide Dinitrate 40 mg sent to [facility] pharmacy in [place]. ?

## 2021-10-11 NOTE — Telephone Encounter (Signed)
Returned PC to patient - he called stating he was receiving his revlimid rx too frequently, that he has too much medication and that we are "sending the refills too soon."  This RN called [facility], spoke to [place], Occupational psychologist.  She states [facility] spoke with the patient this morning & his current revlimid rx is on hold, they have requested that the patient notify them when he starts his "off week" & his medication will be shipped at that time.  Instructed patient to do the same, he verbalizes understanding. ?

## 2021-10-24 NOTE — Progress Notes (Deleted)
? ?Synopsis: Referred for COPD by No ref. provider found ? ?Subjective:  ? ?PATIENT ID: James [name]. GENDER: male DOB: 1946-01-01, MRN: [id number] ? ?No chief complaint on file. ? 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 ?He says it is hard for him to breathe. Been an issue for over 2 years. DOE with minimal activity - notes it even walking from waiting room. He doesn't have much cough at all, only occasional phlegm production. He doesn't think the papilloma excision helped or worsened his DOE. He doesn't have any orthopnea. He does have BLE swelling. He has been using trelegy for about 3 months and he's not convinced that it is helpful. He's also not convinced that his nebulizers help him much. ? ?He's not sure if he snores. No PND. No morning headaches and not convinced he has excessive daytime sleepiness.  ? ?No family history of lung disease, no lung cancer ? 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 Didn't have any trouble breathing back then. Smoked for 30ish years, quit 1985, smoked crack for a little while.  ? ?Interval HPI: ? ? ?Otherwise pertinent review of systems is negative. ? ? ? ?Past Medical History:  ?Diagnosis Date  ? Anemia   ? Arthritis   ? BPH (benign prostatic hyperplasia)   ? CHF (congestive heart failure) ([place])   ? Chronic pain   ? Diabetes mellitus   ? Dyspnea   ? GERD (gastroesophageal reflux disease)   ? Headache(784.0)   ? migraines, sinus headaches  ? Hepatitis   ? C and B-treated  ? HLD (hyperlipidemia)   ? Hypertension   ? Leukemia ([place])   ? Lupus ([place])   ? Multiple myeloma ([place])   ?  Pneumonia 07/2011  ? Sleep apnea 03/2018  ? going for fitting on 04-29-18  ? Thyroid disease   ? goiter  ? Urinary frequency   ?  ? ?Family History  ?Problem Relation Age of Onset  ? Hyperlipidemia Mother   ? Hypertension Mother   ?  ? ?Past Surgical History:  ?Procedure Laterality Date  ? ABDOMINAL SURGERY    ? BACK SURGERY    ? x 5   ? BIOPSY  01/02/2020  ? Procedure: BIOPSY;  Surgeon: Carol Ada, MD;  Location: [facility];  Service: Endoscopy;;  ? CHOLECYSTECTOMY    ? COLONOSCOPY    ? COLONOSCOPY WITH PROPOFOL N/A 01/02/2020  ? Procedure: COLONOSCOPY WITH PROPOFOL;  Surgeon: Carol Ada, MD;  Location: [facility];  Service: Endoscopy;  Laterality: N/A;  ? cyst removal skull  20 years ago  ? ETHMOIDECTOMY  10/12/2011  ? Procedure: ETHMOIDECTOMY;  Surgeon: Thornell Sartorius, MD;  Location: [facility];  Service: ENT;  Laterality: Bilateral;  bilateral maxillary sinus osteal enlargement, frontal sinusotomy  ? EYE SURGERY    ? bilat cataract with lens implants  ? HAND SURGERY    ? right finger  ? HERNIA REPAIR    ? MICROLARYNGOSCOPY N/A 02/18/2021  ? Procedure: MICROLARYNGOSCOPY with Excisional Biopsy;  Surgeon: Jerrell Belfast, MD;  Location: [place];  Service: ENT;  Laterality: N/A;  ? POLYPECTOMY  01/02/2020  ? Procedure: POLYPECTOMY;  Surgeon: Carol Ada, MD;  Location: [facility];  Service: Endoscopy;;  ? ROTATOR CUFF REPAIR    ? bilateral  ? SHOULDER ARTHROSCOPY WITH ROTATOR CUFF REPAIR Left 05/18/2014  ? Procedure: SHOULDER ARTHROSCOPY WITH ROTATOR CUFF REPAIR;  Surgeon: Nita Sells, MD;  Location: [place];  Service: Orthopedics;  Laterality: Left;  Left shoulder arthroscopy rotator cuff repair, subacromial decompression  ? sinus surgery    ? TONSILLECTOMY    ? TRIGGER FINGER RELEASE Right 05/02/2018  ? Procedure: RELEASE TRIGGER FINGER/A-1 PULLEY RIGHT SMALL FINGER;  Surgeon: Leanora Cover, MD;  Location: [place];  Service: Orthopedics;  Laterality: Right;   ? ? ?Social History  ? ?Socioeconomic History  ? Marital status: Married  ?  Spouse name: Not on file  ? Number of children: Not on file  ? Years of education: Not on file  ? Highest education level: Not on file  ?Occupational History  ? Not on file  ?Tobacco Use  ? Smoking status: Former  ?  Packs/day: 1.00  ?  Years: 26.00  ?  Pack years: 26.00  ?  Types: Cigarettes  ?  Quit date: 05/12/1984  ?  Years since quitting: 37.4  ? Smokeless tobacco: Never  ?Vaping Use  ? Vaping Use: Not on file  ?Substance and Sexual Activity  ? Alcohol use: No  ? 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  Comment: over 40 years ago  ? Sexual activity: Not Currently  ?Other Topics Concern  ? Not on file  ?Social History Narrative  ? Not on file  ? ?Social Determinants of Health  ? ?Financial Resource Strain: Not on file  ?Food Insecurity: Not on file  ?Transportation Needs: Not on file  ?Physical Activity: Not on file  ?Stress: Not on file  ?Social Connections: Not on file  ?Intimate Partner Violence: Not on file  ?  ? ?Allergies  ?Allergen Reactions  ? Invokana [Canagliflozin] Anaphylaxis  ?  Facial/neck/ lip swelling  ? Ace Inhibitors Cough  ?  ? ?Outpatient Medications Prior to Visit  ?Medication Sig Dispense Refill  ? amLODipine (NORVASC) 10 MG tablet Take 10 mg by mouth every morning.     ? aspirin-sod bicarb-citric acid (ALKA-SELTZER) 325 MG TBEF tablet Take 650 mg by mouth every 6 (six) hours as needed (indigestion / headaches).    ? bismuth subsalicylate (PEPTO BISMOL) 262 MG/15ML suspension Take 30 mLs by mouth every 6 (six) hours as needed for indigestion or diarrhea or loose stools.    ? Calcium Carbonate-Vitamin D (CALCIUM 600+D PO) Take 1 tablet by mouth in the morning and at bedtime.    ? colestipol (COLESTID) 1 g tablet Take 1 g by mouth 2 (two) times daily.    ? dexamethasone (DECADRON) 4 MG tablet TAKE 5 TABLETS BY MOUTH ONCE A WEEK 64 tablet 0  ? diphenhydrAMINE HCl (ZZZQUIL) 50 MG/30ML LIQD Take 30 mLs by mouth at bedtime  as needed (sleep).    ? Ferrous Fumarate (HEMOCYTE - 106 MG FE) 324 (106 Fe) MG TABS tablet Take 1 tablet by mouth 2 (two) times daily.    ? finasteride (PROPECIA) 1 MG tablet Take 1 mg by mouth in the morning.    ? Fluticasone-Umeclidin-Vilant (TRELEGY ELLIPTA) 100-62.5-25 MCG/ACT AEPB Inhale 1 puff into the lungs daily.    ? gabapentin (NEURONTIN) 400 MG capsule Take 400-800 mg by  mouth See admin instructions. Take 400mg  at lunch time and  2 Capsules (800 mg) at bedtime    ? hydrochlorothiazide (HYDRODIURIL) 25 MG tablet Take 25 mg by mouth in the morning.    ? HYDROcodone-acetaminophen (NORCO) 10-325 MG tablet Take 1 tablet by mouth in the morning, at noon, and at bedtime.    ? HYSINGLA ER 60 MG T24A Take 60 mg by mouth in the morning.    ? insulin degludec (TRESIBA FLEXTOUCH) 200 UNIT/ML FlexTouch Pen Inject 30 Units into the skin daily after supper.    ? lenalidomide (REVLIMID) 15 MG capsule TAKE 1 CAPSULE BY MOUTH EVERY DAY FOR 21 DAYS FOLLOWED BY 7 DAYS OFF FOR A 28 DAY CYCLE 21 capsule 0  ? losartan (COZAAR) 50 MG tablet Take 50 mg by mouth every morning.     ? Oxycodone HCl 20 MG TABS Take 20 mg by mouth every 8 (eight) hours as needed (pain.).    ? pantoprazole (PROTONIX) 40 MG tablet Take 40 mg by mouth 2 (two) times daily.    ? pioglitazone (ACTOS) 15 MG tablet Take 15 mg by mouth in the morning.    ? RESTASIS 0.05 % ophthalmic emulsion Place 1 drop into both eyes 2 (two) times daily as needed for dry eyes.    ? Tamsulosin HCl (FLOMAX) 0.4 MG CAPS Take 0.4 mg by mouth 2 (two) times daily.     ? tiZANidine (ZANAFLEX) 4 MG tablet Take 4-8 mg by mouth See admin instructions. Take 1 tablet (4 mg) by mouth in the morning & take 2 tablets (8 mg) by mouth at night.    ? traMADol (ULTRAM) 50 MG tablet Take 50 mg by mouth 2 (two) times daily.    ? TRESIBA FLEXTOUCH 100 UNIT/ML FlexTouch Pen Inject 30 Units into the skin daily.    ? vitamin B-12 (CYANOCOBALAMIN) 1000 MCG tablet Take 1,000 mcg by mouth in the  morning and at bedtime.    ? ?No facility-administered medications prior to visit.  ? ? ? ? ? ?Objective:  ? ?Physical Exam: ? ?General appearance: 76 y.o., male, NAD, conversant  ?Eyes: anicteric sclerae; PERRL, tr

## 2021-10-26 ENCOUNTER — Ambulatory Visit: Payer: Medicare PPO | Admitting: Student

## 2021-10-26 ENCOUNTER — Other Ambulatory Visit: Payer: Self-pay

## 2021-10-26 ENCOUNTER — Ambulatory Visit: Payer: Medicare HMO

## 2021-10-26 ENCOUNTER — Ambulatory Visit (HOSPITAL_COMMUNITY)
Admission: RE | Admit: 2021-10-26 | Discharge: 2021-10-26 | Disposition: A | Discharge status: Home / Self Care | Payer: Medicare PPO | Source: Ambulatory Visit | Attending: Student | Admitting: Student

## 2021-10-26 ENCOUNTER — Ambulatory Visit: Payer: Medicare HMO | Admitting: Student

## 2021-10-26 ENCOUNTER — Encounter: Payer: Self-pay | Admitting: Student

## 2021-10-26 VITALS — BP 134/68 | HR 94 | Temp 98.4°F | Ht 72.0 in | Wt 214.0 lb

## 2021-10-26 DIAGNOSIS — R600 Localized edema: Secondary | ICD-10-CM | POA: Diagnosis not present

## 2021-10-26 DIAGNOSIS — R0609 Other forms of dyspnea: Secondary | ICD-10-CM | POA: Diagnosis not present

## 2021-10-26 DIAGNOSIS — G4733 Obstructive sleep apnea (adult) (pediatric): Secondary | ICD-10-CM | POA: Diagnosis not present

## 2021-10-26 MED ORDER — FUROSEMIDE 20 MG PO TABS: 20.0000 mg | ORAL_TABLET | Freq: Every day | ORAL | 2 refills | Status: DC

## 2021-10-26 NOTE — Patient Instructions (Addendum)
-   labs in a week at our office ?- lasix 20 mg daily in the morning until your weight decreases to 200 lb then can stop. Weigh yourself daily. If weight goes up 3 lb in a day or 5 lb in a week you may need to resume it but would discuss this with your primary care doctor so they know when they will need to check labs on you again. While you are on lasix eat an extra banana each day to keep your potassium level up.  ?- you will be called to schedule your CT Chest, echo of your heart, ultrasound of your legs ?- see you in 6-8 weeks in clinic! ?

## 2021-10-26 NOTE — Progress Notes (Signed)
? ?Synopsis: Referred for COPD by No ref. provider found ? ?Subjective:  ? ?PATIENT ID: James [name]. GENDER: male DOB: Jan 14, 1946, MRN: [id number] ? ?Chief Complaint  ?Patient presents with  ? Follow-up  ?  Breathing is unchanged since the last visit.   ? ?54yM with history of IgA kappa MM with relapse now in remission on revlimid followed by Dr. 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 ?He says it is hard for him to breathe. Been an issue for over 2 years. DOE with minimal activity - notes it even walking from waiting room. He doesn't have much cough at all, only occasional phlegm production. He doesn't think the papilloma excision helped or worsened his DOE. He doesn't have any orthopnea. He does have BLE swelling. He has been using trelegy for about 3 months and he's not convinced that it is helpful. He's also not convinced that his nebulizers help him much. ? ?He's not sure if he snores. No PND. No morning headaches and not convinced he has excessive daytime sleepiness.  ? ?No family history of lung disease, no lung cancer ? 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 Didn't have any trouble breathing back then. Smoked for 30ish years, quit 1985, smoked crack for a little while.  ? ?Interval HPI: ?He says that he has same level of DOE with minimal activity. No cough. Still using trelegy and rinsing mouth afterward. He doesn't believe it's been helpful for him. No CP. He has increased BLE swelling. ? ?Hasn't needed any steroids since last visit for COPD. But he is still on dexamethasone 20 mg weekly for his MM. ? ?Otherwise pertinent review of systems is negative. ? ? ? ?Past Medical History:  ?Diagnosis  Date  ? Anemia   ? Arthritis   ? BPH (benign prostatic hyperplasia)   ? CHF (congestive heart failure) ([place])   ? Chronic pain   ? Diabetes mellitus   ? Dyspnea   ? GERD (gastroesophageal reflux disease)   ? Headache(784.0)   ? migraines, sinus headaches  ? Hepatitis   ? C and B-treated  ? HLD (hyperlipidemia)   ? Hypertension   ? Leukemia ([place])   ? Lupus ([place])   ? Multiple myeloma ([place])   ? Pneumonia 07/2011  ? Sleep apnea 03/2018  ? going for fitting on 04-29-18  ? Thyroid disease   ? goiter  ? Urinary frequency   ?  ? ?Family History  ?Problem Relation Age of Onset  ? Hyperlipidemia Mother   ? Hypertension Mother   ?  ? ?Past Surgical History:  ?Procedure Laterality Date  ? ABDOMINAL SURGERY    ? BACK SURGERY    ? x 5   ? BIOPSY  01/02/2020  ? Procedure: BIOPSY;  Surgeon: Carol Ada, MD;  Location: [facility];  Service: Endoscopy;;  ? CHOLECYSTECTOMY    ? COLONOSCOPY    ? COLONOSCOPY WITH PROPOFOL N/A 01/02/2020  ? Procedure: COLONOSCOPY WITH PROPOFOL;  Surgeon: Carol Ada, MD;  Location: [facility];  Service: Endoscopy;  Laterality: N/A;  ? cyst removal skull  20 years ago  ? ETHMOIDECTOMY  10/12/2011  ? Procedure: ETHMOIDECTOMY;  Surgeon:  Thornell Sartorius, MD;  Location: [facility] OR;  Service: ENT;  Laterality: Bilateral;  bilateral maxillary sinus osteal enlargement, frontal sinusotomy  ? EYE SURGERY    ? bilat cataract with lens implants  ? HAND SURGERY    ? right finger  ? HERNIA REPAIR    ? MICROLARYNGOSCOPY N/A 02/18/2021  ? Procedure: MICROLARYNGOSCOPY with Excisional Biopsy;  Surgeon: Jerrell Belfast, MD;  Location: [place];  Service: ENT;  Laterality: N/A;  ? POLYPECTOMY  01/02/2020  ? Procedure: POLYPECTOMY;  Surgeon: Carol Ada, MD;  Location: [facility];  Service: Endoscopy;;  ? ROTATOR CUFF REPAIR    ? bilateral  ? SHOULDER ARTHROSCOPY WITH ROTATOR CUFF REPAIR Left 05/18/2014  ? Procedure: SHOULDER ARTHROSCOPY WITH ROTATOR CUFF REPAIR;  Surgeon: Nita Sells, MD;  Location: [place];  Service: Orthopedics;  Laterality: Left;  Left shoulder arthroscopy rotator cuff repair, subacromial decompression  ? sinus surgery    ? TONSILLECTOMY    ? TRIGGER FINGER RELEASE Right 05/02/2018  ? Procedure: RELEASE TRIGGER FINGER/A-1 PULLEY RIGHT SMALL FINGER;  Surgeon: Leanora Cover, MD;  Location: [place];  Service: Orthopedics;  Laterality: Right;  ? ? ?Social History  ? ?Socioeconomic History  ? Marital status: Married  ?  Spouse name: Not on file  ? Number of children: Not on file  ? Years of education: Not on file  ? Highest education level: Not on file  ?Occupational History  ? Not on file  ?Tobacco Use  ? Smoking status: Former  ?  Packs/day: 1.00  ?  Years: 26.00  ?  Pack years: 26.00  ?  Types: Cigarettes  ?  Quit date: 05/12/1984  ?  Years since quitting: 37.4  ? Smokeless tobacco: Never  ?Vaping Use  ? Vaping Use: Not on file  ?Substance and Sexual Activity  ? Alcohol use: No  ? 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  Comment: over 40 years ago  ? Sexual activity: Not Currently  ?Other Topics Concern  ? Not on file  ?Social History Narrative  ? Not on file  ? ?Social Determinants of Health  ? ?Financial Resource Strain: Not on file  ?Food Insecurity: Not on file  ?Transportation Needs: Not on file  ?Physical Activity: Not on file  ?Stress: Not on file  ?Social Connections: Not on file  ?Intimate Partner Violence: Not on file  ?  ? ?Allergies  ?Allergen Reactions  ? Invokana [Canagliflozin] Anaphylaxis  ?  Facial/neck/ lip swelling  ? Ace Inhibitors Cough  ?  ? ?Outpatient Medications Prior to Visit  ?Medication Sig Dispense Refill  ? amLODipine (NORVASC) 10 MG tablet Take 10 mg by mouth every morning.     ? aspirin-sod bicarb-citric acid (ALKA-SELTZER) 325 MG TBEF tablet Take 650 mg by mouth every 6 (six) hours as needed (indigestion / headaches).    ? bismuth subsalicylate (PEPTO BISMOL) 262 MG/15ML suspension Take 30 mLs by mouth every 6 (six) hours as needed for  indigestion or diarrhea or loose stools.    ? Calcium Carbonate-Vitamin D (CALCIUM 600+D PO) Take 1 tablet by mouth in the morning and at bedtime.    ? colestipol (COLESTID) 1 g tablet Take 1 g by mouth 2 (two) times daily.    ? dexamethasone (DECADRON) 4 MG tablet TAKE 5 TABLETS BY MOUTH ONCE A WEEK 64 tablet 0  ? diphenhydrAMINE HCl (ZZZQUIL) 50 MG/30ML LIQD Take 30 mLs by mouth at bedtime as needed (sleep).    ?  Ferrous Fumarate (HEMOCYTE - 106 MG FE) 324 (106 Fe) MG TABS tablet Take 1 tablet by mouth 2 (two) times daily.    ? finasteride (PROPECIA) 1 MG tablet Take 1 mg by mouth in the morning.    ? Fluticasone-Umeclidin-Vilant (TRELEGY ELLIPTA) 100-62.5-25 MCG/ACT AEPB Inhale 1 puff into the lungs daily.    ? gabapentin (NEURONTIN) 400 MG capsule Take 400-800 mg by mouth See admin instructions. Take 400mg  at lunch time and  2 Capsules (800 mg) at bedtime    ? hydrochlorothiazide (HYDRODIURIL) 25 MG tablet Take 25 mg by mouth in the morning.    ? HYDROcodone-acetaminophen (NORCO) 10-325 MG tablet Take 1 tablet by mouth in the morning, at noon, and at bedtime.    ? insulin degludec (TRESIBA FLEXTOUCH) 200 UNIT/ML FlexTouch Pen Inject 30 Units into the skin daily after supper.    ? lenalidomide (REVLIMID) 15 MG capsule TAKE 1 CAPSULE BY MOUTH EVERY DAY FOR 21 DAYS FOLLOWED BY 7 DAYS OFF FOR A 28 DAY CYCLE 21 capsule 0  ? losartan (COZAAR) 50 MG tablet Take 50 mg by mouth every morning.     ? Oxycodone HCl 20 MG TABS Take 20 mg by mouth every 8 (eight) hours as needed (pain.).    ? pantoprazole (PROTONIX) 40 MG tablet Take 40 mg by mouth 2 (two) times daily.    ? pioglitazone (ACTOS) 15 MG tablet Take 15 mg by mouth in the morning.    ? RESTASIS 0.05 % ophthalmic emulsion Place 1 drop into both eyes 2 (two) times daily as needed for dry eyes.    ? Tamsulosin HCl (FLOMAX) 0.4 MG CAPS Take 0.4 mg by mouth 2 (two) times daily.     ? tiZANidine (ZANAFLEX) 4 MG tablet Take 4-8 mg by mouth See admin instructions. Take 1  tablet (4 mg) by mouth in the morning & take 2 tablets (8 mg) by mouth at night.    ? traMADol (ULTRAM) 50 MG tablet Take 50 mg by mouth 2 (two) times daily.    ? TRESIBA FLEXTOUCH 100 UNIT/ML FlexTouch Pen [place]

## 2021-10-28 DIAGNOSIS — G4733 Obstructive sleep apnea (adult) (pediatric): Secondary | ICD-10-CM | POA: Diagnosis not present

## 2021-11-01 ENCOUNTER — Other Ambulatory Visit: Payer: Self-pay

## 2021-11-01 ENCOUNTER — Inpatient Hospital Stay: Payer: Medicare HMO | Admitting: Oncology

## 2021-11-01 ENCOUNTER — Inpatient Hospital Stay: Payer: Medicare HMO | Attending: Oncology

## 2021-11-01 VITALS — BP 139/69 | HR 90 | Temp 97.9°F | Resp 17 | Ht 72.0 in | Wt 219.9 lb

## 2021-11-01 DIAGNOSIS — Z7952 Long term (current) use of systemic steroids: Secondary | ICD-10-CM | POA: Insufficient documentation

## 2021-11-01 DIAGNOSIS — Z7961 Long term (current) use of immunomodulator: Secondary | ICD-10-CM | POA: Insufficient documentation

## 2021-11-01 DIAGNOSIS — D649 Anemia, unspecified: Secondary | ICD-10-CM | POA: Diagnosis not present

## 2021-11-01 DIAGNOSIS — Z79899 Other long term (current) drug therapy: Secondary | ICD-10-CM | POA: Insufficient documentation

## 2021-11-01 DIAGNOSIS — R600 Localized edema: Secondary | ICD-10-CM | POA: Diagnosis not present

## 2021-11-01 DIAGNOSIS — E876 Hypokalemia: Secondary | ICD-10-CM | POA: Insufficient documentation

## 2021-11-01 DIAGNOSIS — C9001 Multiple myeloma in remission: Secondary | ICD-10-CM

## 2021-11-01 DIAGNOSIS — Z7984 Long term (current) use of oral hypoglycemic drugs: Secondary | ICD-10-CM | POA: Diagnosis not present

## 2021-11-01 DIAGNOSIS — R0609 Other forms of dyspnea: Secondary | ICD-10-CM | POA: Diagnosis not present

## 2021-11-01 DIAGNOSIS — C9000 Multiple myeloma not having achieved remission: Secondary | ICD-10-CM | POA: Insufficient documentation

## 2021-11-01 LAB — CBC WITH DIFFERENTIAL (CANCER CENTER ONLY)
Abs Immature Granulocytes: 0.02 10*3/uL (ref 0.00–0.07)
Basophils Absolute: 0.1 10*3/uL (ref 0.0–0.1)
Basophils Relative: 1 %
Eosinophils Absolute: 0.1 10*3/uL (ref 0.0–0.5)
Eosinophils Relative: 1 %
HCT: 35.5 % — ABNORMAL LOW (ref 39.0–52.0)
Hemoglobin: 11.7 g/dL — ABNORMAL LOW (ref 13.0–17.0)
Immature Granulocytes: 0 %
Lymphocytes Relative: 18 %
Lymphs Abs: 1.2 10*3/uL (ref 0.7–4.0)
MCH: 30.9 pg (ref 26.0–34.0)
MCHC: 33 g/dL (ref 30.0–36.0)
MCV: 93.7 fL (ref 80.0–100.0)
Monocytes Absolute: 0.8 10*3/uL (ref 0.1–1.0)
Monocytes Relative: 11 %
Neutro Abs: 4.8 10*3/uL (ref 1.7–7.7)
Neutrophils Relative %: 69 %
Platelet Count: 232 10*3/uL (ref 150–400)
RBC: 3.79 MIL/uL — ABNORMAL LOW (ref 4.22–5.81)
RDW: 16.3 % — ABNORMAL HIGH (ref 11.5–15.5)
WBC Count: 7 10*3/uL (ref 4.0–10.5)
nRBC: 0 % (ref 0.0–0.2)

## 2021-11-01 LAB — CMP (CANCER CENTER ONLY)
ALT: 11 U/L (ref 0–44)
AST: 11 U/L — ABNORMAL LOW (ref 15–41)
Albumin: 3.8 g/dL (ref 3.5–5.0)
Alkaline Phosphatase: 67 U/L (ref 38–126)
Anion gap: 8 (ref 5–15)
BUN: 9 mg/dL (ref 8–23)
CO2: 28 mmol/L (ref 22–32)
Calcium: 8.6 mg/dL — ABNORMAL LOW (ref 8.9–10.3)
Chloride: 105 mmol/L (ref 98–111)
Creatinine: 0.96 mg/dL (ref 0.61–1.24)
GFR, Estimated: >60 mL/min (ref >60)
Glucose, Bld: 132 mg/dL — ABNORMAL HIGH (ref 70–99)
Potassium: 3.2 mmol/L — ABNORMAL LOW (ref 3.5–5.1)
Sodium: 141 mmol/L (ref 135–145)
Total Bilirubin: 0.4 mg/dL (ref 0.3–1.2)
Total Protein: 6.2 g/dL — ABNORMAL LOW (ref 6.5–8.1)

## 2021-11-01 NOTE — Progress Notes (Signed)
Hematology and Oncology Follow Up Visit ? ?James [name]. ?[id number] ?02-20-1946 76 y.o. ?11/01/2021 2:32 PM ?Ashley Akin, NPNo ref. provider found  ? ?Principle Diagnosis: 76 year old man with multiple myeloma diagnosed in 2017.  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  He achieved complete response to therapy. ? ? ?Prior therapy:  ?Revlimid and dexamethasone started on January 28 2016. Revlimid at 25 mg daily for 21 days with dexamethasone at 20 mg weekly. Therapy discontinued in January 2018 after achieving a complete response. ? ?He developed relapsed disease in April 2020 with M spike of 3.9 g/dL IgM level of 4707. ? ?Current therapy:  ? ?Revlimid 15 mg daily for 21 days out of a 28 day cycle with dexamethasone 40 mg weekly started in May 2020.  He achieved a complete response to therapy in May 2021. ? ?Revlimid 5 mg daily 3 weeks on 1 week off started in May 2021.  Therapy discontinued in June 2021. ? ?Revlimid 15 mg daily restarted in June 2021. ? ? ?Interim History:  [name] returns today for repeat follow-up.  Since the last visit, he has reported complaints of shortness of breath and was evaluated by pulmonary medicine.  His work-up so far has been unrevealing.  He has reported overall generalized edema weight gain.  He continues to take dexamethasone 20 mg weekly.  He is still ambulatory without any difficulties at this time.  He denies any falls or syncope.  Continues to have chronic pain which is unchanged. ? ? ? ? ? ? ? ? ? ? ? ? ? ? ? ? ?. ? ? ? ?Medications: Reviewed without changes. ? ?Current Outpatient Medications  ?Medication Sig Dispense Refill  ? amLODipine (NORVASC) 10 MG tablet Take 10 mg by mouth every morning.     ? aspirin-sod bicarb-citric acid (ALKA-SELTZER) 325 MG TBEF tablet Take 650 mg by mouth every 6 (six) hours as needed (indigestion / headaches).    ? bismuth subsalicylate (PEPTO BISMOL) 262 MG/15ML suspension Take 30 mLs by mouth every 6  (six) hours as needed for indigestion or diarrhea or loose stools.    ? Calcium Carbonate-Vitamin D (CALCIUM 600+D PO) Take 1 tablet by mouth in the morning and at bedtime.    ? colestipol (COLESTID) 1 g tablet Take 1 g by mouth 2 (two) times daily.    ? dexamethasone (DECADRON) 4 MG tablet TAKE 5 TABLETS BY MOUTH ONCE A WEEK 64 tablet 0  ? diphenhydrAMINE HCl (ZZZQUIL) 50 MG/30ML LIQD Take 30 mLs by mouth at bedtime as needed (sleep).    ? Ferrous Fumarate (HEMOCYTE - 106 MG FE) 324 (106 Fe) MG TABS tablet Take 1 tablet by mouth 2 (two) times daily.    ? finasteride (PROPECIA) 1 MG tablet Take 1 mg by mouth in the morning.    ? Fluticasone-Umeclidin-Vilant (TRELEGY ELLIPTA) 100-62.5-25 MCG/ACT AEPB Inhale 1 puff into the lungs daily.    ? furosemide (LASIX) 20 MG tablet Take 1 tablet (20 mg total) by mouth daily. 30 tablet 2  ? gabapentin (NEURONTIN) 400 MG capsule Take 400-800 mg by mouth See admin instructions. Take 400mg  at lunch time and  2 Capsules (800 mg) at bedtime    ? hydrochlorothiazide (HYDRODIURIL) 25 MG tablet Take 25 mg by mouth in the morning.    ? HYDROcodone-acetaminophen (NORCO) 10-325 MG tablet Take 1 tablet by mouth in the morning, at noon, and at bedtime.    ? HYSINGLA  ER 60 MG T24A Take 60 mg by mouth in the morning. (Patient not taking: Reported on 10/26/2021)    ? insulin degludec (TRESIBA FLEXTOUCH) 200 UNIT/ML FlexTouch Pen Inject 30 Units into the skin daily after supper.    ? lenalidomide (REVLIMID) 15 MG capsule TAKE 1 CAPSULE BY MOUTH EVERY DAY FOR 21 DAYS FOLLOWED BY 7 DAYS OFF FOR A 28 DAY CYCLE 21 capsule 0  ? losartan (COZAAR) 50 MG tablet Take 50 mg by mouth every morning.     ? Oxycodone HCl 20 MG TABS Take 20 mg by mouth every 8 (eight) hours as needed (pain.).    ? pantoprazole (PROTONIX) 40 MG tablet Take 40 mg by mouth 2 (two) times daily.    ? pioglitazone (ACTOS) 15 MG tablet Take 15 mg by mouth in the morning.    ? RESTASIS 0.05 % ophthalmic emulsion Place 1 drop into both  eyes 2 (two) times daily as needed for dry eyes.    ? Tamsulosin HCl (FLOMAX) 0.4 MG CAPS Take 0.4 mg by mouth 2 (two) times daily.     ? tiZANidine (ZANAFLEX) 4 MG tablet Take 4-8 mg by mouth See admin instructions. Take 1 tablet (4 mg) by mouth in the morning & take 2 tablets (8 mg) by mouth at night.    ? traMADol (ULTRAM) 50 MG tablet Take 50 mg by mouth 2 (two) times daily.    ? TRESIBA FLEXTOUCH 100 UNIT/ML FlexTouch Pen Inject 30 Units into the skin daily.    ? vitamin B-12 (CYANOCOBALAMIN) 1000 MCG tablet Take 1,000 mcg by mouth in the morning and at bedtime.    ? ?No current facility-administered medications for this visit.  ? ? ?Allergies:  ?Allergies  ?Allergen Reactions  ? Invokana [Canagliflozin] Anaphylaxis  ?  Facial/neck/ lip swelling  ? Ace Inhibitors Cough  ? ? ? ? ?Physical Exam: ?Blood pressure 139/69, pulse 90, temperature 97.9 ?F (36.6 ?C), temperature source Temporal, resp. rate 17, height 6' (1.829 m), weight 219 lb 14.4 oz (99.7 kg), SpO2 98 %. ? ? ? ?ECOG: 1 ?  ? ? ?General appearance: Comfortable appearing without any discomfort ?Head: Normocephalic without any trauma ?Oropharynx: Mucous membranes are moist and pink without any thrush or ulcers. ?Eyes: Pupils are equal and round reactive to light. ?Lymph nodes: No cervical, supraclavicular, inguinal or axillary lymphadenopathy.   ?Heart:regular rate and rhythm.  S1 and S2.  Bilateral edema noted ?Lung: Clear without any rhonchi or wheezes.  No dullness to percussion. ?Abdomin: Soft, nontender, nondistended with good bowel sounds.  No hepatosplenomegaly. ?Musculoskeletal: No joint deformity or effusion.  Full range of motion noted. ?Neurological: No deficits noted on motor, sensory and deep tendon reflex exam. ?Skin: No petechial rash or dryness.  Appeared moist.  ? ? ? ? ? ? ? ? ?. ? ? ? ? ? ?Lab Results: ?Lab Results  ?Component Value Date  ? WBC 9.2 07/29/2021  ? HGB 11.9 (L) 07/29/2021  ? HCT 35.5 (L) 07/29/2021  ? MCV 92.2 07/29/2021   ? PLT 189 07/29/2021  ? ?  Chemistry   ?   ?Component Value Date/Time  ? NA 135 07/29/2021 0910  ? NA 138 06/26/2017 0946  ? K 3.7 07/29/2021 0910  ? K 3.8 06/26/2017 0946  ? CL 100 07/29/2021 0910  ? CL 105 08/09/2012 0918  ? CO2 24 07/29/2021 0910  ? CO2 25 06/26/2017 0946  ? BUN 22 07/29/2021 0910  ? BUN 16.4 06/26/2017 0946  ?  CREATININE 1.15 07/29/2021 0910  ? CREATININE 1.2 06/26/2017 0946  ?    ?Component Value Date/Time  ? CALCIUM 9.2 07/29/2021 0910  ? CALCIUM 9.3 06/26/2017 0946  ? ALKPHOS 62 07/29/2021 0910  ? ALKPHOS 75 06/26/2017 0946  ? AST 9 (L) 07/29/2021 0910  ? AST 11 06/26/2017 0946  ? ALT 14 07/29/2021 0910  ? ALT 14 06/26/2017 0946  ? BILITOT 0.5 07/29/2021 0910  ? BILITOT 0.31 06/26/2017 0946  ?  ? ? ? ? Latest Reference Range & Units 12/08/20 10:11 03/30/21 09:23 07/29/21 09:10  ?M Protein SerPl Elph-Mcnc Not Observed g/dL Not Observed (C) Not Observed (C) Not Observed (C)  ?IFE 1  Comment (C) Comment (C) Comment (C)  ?Globulin, Total 2.2 - 3.9 g/dL 2.8 (C) 2.7 (C) 2.4 (C)  ?B-Globulin SerPl Elph-Mcnc 0.7 - 1.3 g/dL 1.1 (C) 1.0 (C) 0.9 (C)  ?IgG (Immunoglobin G), Serum 603 - 1,613 mg/dL 536 (L) 559 (L) 502 (L)  ?IgM (Immunoglobulin M), Srm 15 - 143 mg/dL 11 (L) 14 (L) 13 (L)  ?IgA 61 - 437 mg/dL 164 157 117  ?(L): Data is abnormally low ?(C): Corrected ? ? Latest Reference Range & Units 12/08/20 10:10 03/30/21 09:22 07/29/21 09:10  ?Kappa free light chain 3.3 - 19.4 mg/L 23.6 (H) 37.3 (H) 17.0  ?Lambda free light chains 5.7 - 26.3 mg/L 15.4 20.2 10.1  ?Kappa, lambda light chain ratio 0.26 - 1.65  1.53 1.85 (H) 1.68 (H)  ? ? ? ? ?76 year old with: ? ?1.  Multiple myeloma diagnosed in 2017.  He has IgA kappa and achieved a complete response to therapy. ? ?He remains on Revlimid with excellent overall tolerance and response.  Protein studies continues to show M spike that is undetectable and had normalization of his immunoglobulins.  He has normal kappa and lambda ratio with slight elevation in  the ratio.  Risks and benefits of continuing this treatment were discussed at this time.  Different salvage therapy options were reviewed including daratumumab.  At this time, he is agreeable to continue.

## 2021-11-02 LAB — KAPPA/LAMBDA LIGHT CHAINS
Kappa free light chain: 19.2 mg/L (ref 3.3–19.4)
Kappa, lambda light chain ratio: 1.73 — ABNORMAL HIGH (ref 0.26–1.65)
Lambda free light chains: 11.1 mg/L (ref 5.7–26.3)

## 2021-11-04 ENCOUNTER — Ambulatory Visit (INDEPENDENT_AMBULATORY_CARE_PROVIDER_SITE_OTHER)
Admission: RE | Admit: 2021-11-04 | Discharge: 2021-11-04 | Disposition: A | Discharge status: Home / Self Care | Payer: Medicare HMO | Source: Ambulatory Visit | Attending: Student | Admitting: Student

## 2021-11-04 DIAGNOSIS — R0609 Other forms of dyspnea: Secondary | ICD-10-CM

## 2021-11-04 LAB — MULTIPLE MYELOMA PANEL, SERUM
Albumin SerPl Elph-Mcnc: 3.6 g/dL (ref 2.9–4.4)
Albumin/Glob SerPl: 1.7 (ref 0.7–1.7)
Alpha 1: 0.2 g/dL (ref 0.0–0.4)
Alpha2 Glob SerPl Elph-Mcnc: 0.8 g/dL (ref 0.4–1.0)
B-Globulin SerPl Elph-Mcnc: 0.8 g/dL (ref 0.7–1.3)
Gamma Glob SerPl Elph-Mcnc: 0.4 g/dL (ref 0.4–1.8)
Globulin, Total: 2.2 g/dL (ref 2.2–3.9)
IgA: 108 mg/dL (ref 61–437)
IgG (Immunoglobin G), Serum: 484 mg/dL — ABNORMAL LOW (ref 603–1613)
IgM (Immunoglobulin M), Srm: 10 mg/dL — ABNORMAL LOW (ref 15–143)
Total Protein ELP: 5.8 g/dL — ABNORMAL LOW (ref 6.0–8.5)

## 2021-11-13 ENCOUNTER — Other Ambulatory Visit: Payer: Self-pay | Admitting: Oncology

## 2021-11-13 DIAGNOSIS — C9001 Multiple myeloma in remission: Secondary | ICD-10-CM

## 2021-11-15 ENCOUNTER — Ambulatory Visit (HOSPITAL_COMMUNITY): Payer: Medicare HMO | Attending: Cardiology

## 2021-11-15 DIAGNOSIS — R0609 Other forms of dyspnea: Secondary | ICD-10-CM | POA: Diagnosis present

## 2021-11-15 LAB — ECHOCARDIOGRAM COMPLETE
Area-P 1/2: 3.03 cm2
S' Lateral: 2.7 cm

## 2021-11-25 ENCOUNTER — Other Ambulatory Visit: Payer: Self-pay | Admitting: Oncology

## 2021-11-25 DIAGNOSIS — C9001 Multiple myeloma in remission: Secondary | ICD-10-CM

## 2021-12-05 NOTE — Progress Notes (Deleted)
Synopsis: Referred for COPD by No ref. provider found  Subjective:   PATIENT ID: James [name]. GENDER: male DOB: [birth year], MRN: [id number]  No chief complaint on file.  76yM with history of IgA kappa MM with relapse now in remission on revlimid followed by Dr. 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 Been an issue for over 2 years. DOE with minimal activity - notes it even walking from waiting room. He doesn't have much cough at all, only occasional phlegm production. He doesn't think the papilloma excision helped or worsened his DOE. He doesn't have any orthopnea. He does have BLE swelling. He has been using trelegy for about 3 months and he's not convinced that it is helpful. He's also not convinced that his nebulizers help him much.  He's not sure if he snores. No PND. No morning headaches and not convinced he has excessive daytime sleepiness.   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 Didn't have any trouble breathing back then. Smoked for 30ish years, quit 1985, smoked crack for a little while.   Interval HPI: HRCT with progressive UIP pattern fibrosis, HST with mild OSA, TTE with G1DD.   Otherwise pertinent review of systems is negative.    Past Medical History:  Diagnosis Date   Anemia    Arthritis    BPH (benign prostatic hyperplasia)    CHF (congestive heart failure) (HCC)    Chronic pain    Diabetes mellitus    Dyspnea    GERD (gastroesophageal reflux disease)    Headache(784.0)    migraines, sinus headaches   Hepatitis    C and B-treated   HLD (hyperlipidemia)     Hypertension    Leukemia ([place])    Lupus ([place])    Multiple myeloma ([place])    Pneumonia 07/2011   Sleep apnea 03/2018   going for fitting on 04-29-18   Thyroid disease    goiter   Urinary frequency      Family History  Problem Relation Age of Onset   Hyperlipidemia Mother    Hypertension Mother      Past Surgical History:  Procedure Laterality Date   ABDOMINAL SURGERY     BACK SURGERY     x 5    BIOPSY  01/02/2020   Procedure: BIOPSY;  Surgeon: Carol Ada, MD;  Location: [facility];  Service: Endoscopy;;   CHOLECYSTECTOMY     COLONOSCOPY     COLONOSCOPY WITH PROPOFOL N/A 01/02/2020   Procedure: COLONOSCOPY WITH PROPOFOL;  Surgeon: Carol Ada, MD;  Location: [facility];  Service: Endoscopy;  Laterality: N/A;   cyst removal skull  20 years ago   ETHMOIDECTOMY  10/12/2011   Procedure: ETHMOIDECTOMY;  Surgeon: Thornell Sartorius, MD;  Location: [facility];  Service: ENT;  Laterality: Bilateral;  bilateral maxillary sinus osteal enlargement, frontal sinusotomy   EYE SURGERY     bilat cataract with lens implants   HAND SURGERY     right finger   HERNIA REPAIR     MICROLARYNGOSCOPY N/A 02/18/2021   Procedure: MICROLARYNGOSCOPY  with Excisional Biopsy;  Surgeon: Jerrell Belfast, MD;  Location: [place];  Service: ENT;  Laterality: N/A;   POLYPECTOMY  01/02/2020   Procedure: POLYPECTOMY;  Surgeon: Carol Ada, MD;  Location: [facility];  Service: Endoscopy;;   ROTATOR CUFF REPAIR     bilateral   SHOULDER ARTHROSCOPY WITH ROTATOR CUFF REPAIR Left 05/18/2014   Procedure: SHOULDER ARTHROSCOPY WITH ROTATOR CUFF REPAIR;  Surgeon: Nita Sells, MD;  Location: [place];  Service: Orthopedics;  Laterality: Left;  Left shoulder arthroscopy rotator cuff repair, subacromial decompression   sinus surgery     TONSILLECTOMY     TRIGGER FINGER RELEASE Right 05/02/2018   Procedure: RELEASE TRIGGER FINGER/A-1 PULLEY RIGHT SMALL FINGER;  Surgeon: Leanora Cover, MD;  Location:  [place];  Service: Orthopedics;  Laterality: Right;    Social History   Socioeconomic History   Marital status: Married    Spouse name: Not on file   Number of children: Not on file   Years of education: Not on file   Highest education level: Not on file  Occupational History   Not on file  Tobacco Use   Smoking status: Former    Packs/day: 1.00    Years: 26.00    Pack years: 26.00    Types: Cigarettes    Quit date: 05/12/1984    Years since quitting: 37.5   Smokeless tobacco: Never  Vaping Use   Vaping Use: Not on file  Substance and Sexual Activity   Alcohol use: No   Drug use: Yes    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 years ago   Sexual activity: Not Currently  Other Topics Concern   Not on file  Social History Narrative   Not on file   Social Determinants of Health   Financial Resource Strain: Not on file  Food Insecurity: Not on file  Transportation Needs: Not on file  Physical Activity: Not on file  Stress: Not on file  Social Connections: Not on file  Intimate Partner Violence: Not on file     Allergies  Allergen Reactions   Invokana [Canagliflozin] Anaphylaxis    Facial/neck/ lip swelling   Ace Inhibitors Cough     Outpatient Medications Prior to Visit  Medication Sig Dispense Refill   amLODipine (NORVASC) 10 MG tablet Take 10 mg by mouth every morning.      aspirin-sod bicarb-citric acid (ALKA-SELTZER) 325 MG TBEF tablet Take 650 mg by mouth every 6 (six) hours as needed (indigestion / headaches).     bismuth subsalicylate (PEPTO BISMOL) 262 MG/15ML suspension Take 30 mLs by mouth every 6 (six) hours as needed for indigestion or diarrhea or loose stools.     Calcium Carbonate-Vitamin D (CALCIUM 600+D PO) Take 1 tablet by mouth in the morning and at bedtime.     colestipol (COLESTID) 1 g tablet Take 1 g by mouth 2 (two) times daily.     dexamethasone (DECADRON) 4 MG tablet TAKE 5 TABLETS BY MOUTH ONCE A WEEK 64 tablet 0    diphenhydrAMINE HCl (ZZZQUIL) 50 MG/30ML LIQD Take 30 mLs by mouth at bedtime as needed (sleep).     Ferrous Fumarate (HEMOCYTE - 106 MG FE) 324 (106 Fe) MG TABS tablet Take 1 tablet by mouth 2 (two) times daily.     finasteride (PROPECIA) 1 MG tablet Take 1 mg by mouth in the morning.     Fluticasone-Umeclidin-Vilant (TRELEGY ELLIPTA) 100-62.5-25 MCG/ACT AEPB Inhale 1 puff into the lungs daily.  furosemide (LASIX) 20 MG tablet Take 1 tablet (20 mg total) by mouth daily. 30 tablet 2   gabapentin (NEURONTIN) 400 MG capsule Take 400-800 mg by mouth See admin instructions. Take 434m at lunch time and  2 Capsules (800 mg) at bedtime     hydrochlorothiazide (HYDRODIURIL) 25 MG tablet Take 25 mg by mouth in the morning.     HYDROcodone-acetaminophen (NORCO) 10-325 MG tablet Take 1 tablet by mouth in the morning, at noon, and at bedtime.     HYSINGLA ER 60 MG T24A Take 60 mg by mouth in the morning. (Patient not taking: Reported on 10/26/2021)     insulin degludec (TRESIBA FLEXTOUCH) 200 UNIT/ML FlexTouch Pen Inject 30 Units into the skin daily after supper.     lenalidomide (REVLIMID) 15 MG capsule TAKE 1 CAPSULE BY MOUTH EVERY DAY FOR 21 DAYS FOLLOWED BY 7 DAYS OFF FOR A 28 DAY CYCLE. 21 capsule 0   losartan (COZAAR) 50 MG tablet Take 50 mg by mouth every morning.      Oxycodone HCl 20 MG TABS Take 20 mg by mouth every 8 (eight) hours as needed (pain.).     pantoprazole (PROTONIX) 40 MG tablet Take 40 mg by mouth 2 (two) times daily.     pioglitazone (ACTOS) 15 MG tablet Take 15 mg by mouth in the morning.     RESTASIS 0.05 % ophthalmic emulsion Place 1 drop into both eyes 2 (two) times daily as needed for dry eyes.     Tamsulosin HCl (FLOMAX) 0.4 MG CAPS Take 0.4 mg by mouth 2 (two) times daily.      tiZANidine (ZANAFLEX) 4 MG tablet Take 4-8 mg by mouth See admin instructions. Take 1 tablet (4 mg) by mouth in the morning & take 2 tablets (8 mg) by mouth at night.     traMADol (ULTRAM) 50 MG tablet  Take 50 mg by mouth 2 (two) times daily.     TRESIBA FLEXTOUCH 100 UNIT/ML FlexTouch Pen Inject 30 Units into the skin daily.     vitamin B-12 (CYANOCOBALAMIN) 1000 MCG tablet Take 1,000 mcg by mouth in the morning and at bedtime.     No facility-administered medications prior to visit.       Objective:   Physical Exam:  General appearance: 76y.o., male, NAD, conversant  Eyes: anicteric sclerae; PERRL, tracking appropriately HENT: NCAT; MMM Neck: Trachea midline; no lymphadenopathy, no JVD Lungs: faint expiratory wheeze L>R, no crackles, with normal respiratory effort CV: RRR, no murmur  Abdomen: Soft, non-tender; non-distended, BS present  Extremities: 2+ pitting BLE peripheral edema, warm Skin: Normal turgor and texture; no rash Psych: Appropriate affect Neuro: Alert and oriented to person and place, no focal deficit     There were no vitals filed for this visit.     on RA BMI Readings from Last 3 Encounters:  11/01/21 29.82 kg/m  10/26/21 29.02 kg/m  08/29/21 28.81 kg/m   Wt Readings from Last 3 Encounters:  11/01/21 219 lb 14.4 oz (99.7 kg)  10/26/21 214 lb (97.1 kg)  08/29/21 212 lb 6.4 oz (96.3 kg)     CBC    Component Value Date/Time   WBC 7.0 11/01/2021 1445   WBC 5.9 02/16/2021 1530   RBC 3.79 (L) 11/01/2021 1445   HGB 11.7 (L) 11/01/2021 1445   HGB 13.4 06/26/2017 0946   HCT 35.5 (L) 11/01/2021 1445   HCT 40.8 06/26/2017 0946   PLT 232 11/01/2021 1445   PLT 236 06/26/2017 0946  MCV 93.7 11/01/2021 1445   MCV 90.7 06/26/2017 0946   MCH 30.9 11/01/2021 1445   MCHC 33.0 11/01/2021 1445   RDW 16.3 (H) 11/01/2021 1445   RDW 15.7 (H) 06/26/2017 0946   LYMPHSABS 1.2 11/01/2021 1445   LYMPHSABS 1.8 06/26/2017 0946   MONOABS 0.8 11/01/2021 1445   MONOABS 0.6 06/26/2017 0946   EOSABS 0.1 11/01/2021 1445   EOSABS 0.1 06/26/2017 0946   BASOSABS 0.1 11/01/2021 1445   BASOSABS 0.1 06/26/2017 0946    Eos 0-200   Chest Imaging:  CXR 2017  reviewed by me with mild r hemidiaphragm elevation, possible small left pleural effusion  CXR 08/29/21 reviewed by me with prominent intersitital markings and peripheral alveolar opacities  HRCT Chest 11/05/21 with traction bronchiectasis and basilar/subpleural predominant reticular opacities/honeycomb change vs emphysema, 13m RML nodule  10/26/21: [place] BLE without DVT  Pulmonary Functions Testing Results:     View : No data to display.            Echocardiogram:   TTE 10/16/18:  1. The left ventricle has normal systolic function with an ejection  fraction of 60-65%. The cavity size was normal. There is mildly increased  left ventricular wall thickness. Left ventricular diastolic parameters  were normal. There is abnormal septal  motion secondary to conduction delay.   2. The right ventricle has normal systolic function. The cavity was  normal. There is no increase in right ventricular wall thickness.   3. The aortic valve is grossly normal Mild calcification of the aortic  valve. Aortic valve regurgitation is trivial by color flow Doppler.   4. The ascending aorta is normal in size and structure.   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   # Likely COPD gold functional group B  # History of OSA not on CPAP HST with mild OSA AHI 12.4, lowest O2 saturation 85%  Plan: - HRCT Chest, TTE, [place]T BLE  - trial  lasix 20 mg daily, BMP in a week, target weight 200 lb - continue trelegy 1 puff once daily, instructed in ideal use, rinse mouth afterward - albuterol prn     NMaryjane Hurter MD [facility] 12/05/2021 8:05 PM

## 2021-12-07 ENCOUNTER — Ambulatory Visit: Payer: Medicare PPO | Admitting: Student

## 2021-12-15 ENCOUNTER — Other Ambulatory Visit: Payer: Self-pay | Admitting: Oncology

## 2021-12-15 DIAGNOSIS — C9001 Multiple myeloma in remission: Secondary | ICD-10-CM

## 2021-12-19 ENCOUNTER — Other Ambulatory Visit: Payer: Self-pay | Admitting: *Deleted

## 2021-12-19 DIAGNOSIS — C9001 Multiple myeloma in remission: Secondary | ICD-10-CM

## 2021-12-19 MED ORDER — LENALIDOMIDE 15 MG PO CAPS: ORAL_CAPSULE | ORAL | 0 refills | Status: DC

## 2021-12-21 ENCOUNTER — Other Ambulatory Visit: Payer: Self-pay | Admitting: Student

## 2021-12-22 ENCOUNTER — Ambulatory Visit (INDEPENDENT_AMBULATORY_CARE_PROVIDER_SITE_OTHER): Payer: Medicare PPO | Admitting: Cardiology

## 2021-12-22 ENCOUNTER — Encounter: Payer: Self-pay | Admitting: Cardiology

## 2021-12-22 VITALS — BP 162/92 | HR 82 | Ht 64.0 in | Wt 236.2 lb

## 2021-12-22 DIAGNOSIS — Z7901 Long term (current) use of anticoagulants: Secondary | ICD-10-CM | POA: Diagnosis not present

## 2021-12-22 DIAGNOSIS — I7121 Aneurysm of the ascending aorta, without rupture: Secondary | ICD-10-CM

## 2021-12-22 DIAGNOSIS — I130 Hypertensive heart and chronic kidney disease with heart failure and stage 1 through stage 4 chronic kidney disease, or unspecified chronic kidney disease: Secondary | ICD-10-CM

## 2021-12-22 DIAGNOSIS — I5022 Chronic systolic (congestive) heart failure: Secondary | ICD-10-CM | POA: Diagnosis not present

## 2021-12-22 DIAGNOSIS — I4821 Permanent atrial fibrillation: Secondary | ICD-10-CM | POA: Diagnosis not present

## 2021-12-22 NOTE — Patient Instructions (Signed)
Medication Instructions:  Your physician recommends that you continue on your current medications as directed. Please refer to the Current Medication list given to you today.  *If you need a refill on your cardiac medications before your next appointment, please call your pharmacy*   Lab Work: None If you have labs (blood work) drawn today and your tests are completely normal, you will receive your results only by: [place] (if you have MyChart) OR A paper copy in the mail If you have any lab test that is abnormal or we need to change your treatment, we will call you to review the results.   Testing/Procedures: Your physician has requested that you have an echocardiogram. Echocardiography is a painless test that uses sound waves to create images of your heart. It provides your doctor with information about the size and shape of your heart and how well your heart's chambers and valves are working. This procedure takes approximately one hour. There are no restrictions for this procedure.  Non-Cardiac CT scanning, (CAT scanning), is a noninvasive, special x-ray that produces cross-sectional images of the body using x-rays and a computer. CT scans help physicians diagnose and treat medical conditions. For some CT exams, a contrast material is used to enhance visibility in the area of the body being studied. CT scans provide greater clarity and reveal more details than regular x-ray exams.    Follow-Up: At [facility], you and your health needs are our priority.  As part of our continuing mission to provide you with exceptional heart care, we have created designated Provider Care Teams.  These Care Teams include your primary Cardiologist (physician) and Advanced Practice Providers (APPs -  Physician Assistants and Nurse Practitioners) who all work together to provide you with the care you need, when you need it.  We recommend signing up for the patient portal called "MyChart".  Sign up  information is provided on this After Visit Summary.  MyChart is used to connect with patients for Virtual Visits (Telemedicine).  Patients are able to view lab/test results, encounter notes, upcoming appointments, etc.  Non-urgent messages can be sent to your provider as well.   To learn more about what you can do with MyChart, go to [email or web address].    Your next appointment:   9 month(s)  The format for your next appointment:   In Person  Provider:   Shirlee More, MD    Other Instructions None  Important Information About Sugar         [facility]  Tips to measure your blood pressure correctly  To determine whether you have hypertension, a medical professional will take a blood pressure reading. How you prepare for the test, the position of your arm, and other factors can change a blood pressure reading by 10% or more. That could be enough to hide high blood pressure, start you on a drug you don't really need, or lead your doctor to incorrectly adjust your medications. National and international guidelines offer specific instructions for measuring blood pressure. If a doctor, nurse, or medical assistant isn't doing it right, don't hesitate to ask him or her to get with the guidelines. Here's what you can do to ensure a correct reading:  Don't drink a caffeinated beverage or smoke during the 30 minutes before the test.  Sit quietly for five minutes before the test begins.  During the measurement, sit in a chair with your feet on the floor and your arm supported so your elbow is at about heart  level.  The inflatable part of the cuff should completely cover at least 80% of your upper arm, and the cuff should be placed on bare skin, not over a shirt.  Don't talk during the measurement.  Have your blood pressure measured twice, with a brief break in between. If the readings are different by 5 points or more, have it done a third time. There are times to break these rules. If  you sometimes feel lightheaded when getting out of bed in the morning or when you stand after sitting, you should have your blood pressure checked while seated and then while standing to see if it falls from one position to the next. Because blood pressure varies throughout the day, your doctor will rarely diagnose hypertension on the basis of a single reading. Instead, he or she will want to confirm the measurements on at least two occasions, usually within a few weeks of one another. The exception to this rule is if you have a blood pressure reading of 180/110 mm Hg or higher. A result this high usually calls for prompt treatment. It's also a good idea to have your blood pressure measured in both arms at least once, since the reading in one arm (usually the right) may be higher than that in the left. A 2014 study in The American Journal of Medicine of nearly 3,400 people found average arm- to-arm differences in systolic blood pressure of about 5 points. The higher number should be used to make treatment decisions. In 2017, new guidelines from the [place], the [employer] of Cardiology, and nine other health organizations lowered the diagnosis of high blood pressure to 130/80 mm Hg or higher for all adults. The guidelines also redefined the various blood pressure categories to now include normal, elevated, Stage 1 hypertension, Stage 2 hypertension, and hypertensive crisis (see "Blood pressure categories"). Blood pressure categories  Blood pressure category SYSTOLIC (upper number)  DIASTOLIC (lower number)  Normal Less than 120 mm Hg and Less than 80 mm Hg  Elevated 120-129 mm Hg and Less than 80 mm Hg  High blood pressure: Stage 1 hypertension 130-139 mm Hg or 80-89 mm Hg  High blood pressure: Stage 2 hypertension 140 mm Hg or higher or 90 mm Hg or higher  Hypertensive crisis (consult your doctor immediately) Higher than 180 mm Hg and/or Higher than 120 mm Hg  Source: American  Heart Association and American Stroke Association. For more on getting your blood pressure under control, buy Controlling Your Blood Pressure, a Special Health Report from [facility].

## 2021-12-22 NOTE — Progress Notes (Signed)
Cardiology Office Note:    Date:  12/22/2021   ID:  [name], DOB 01-07-1946, MRN [id number]  PCP:  Cathlyn Parsons, PA-C  Cardiologist:  Shirlee More, MD    Referring MD: Cathlyn Parsons, PA-C    ASSESSMENT:    1. Chronic systolic heart failure ([place])   2. Heart & renal disease, hypertensive, with heart fail/chron kidney dis ([place])   3. Permanent atrial fibrillation ([place])   4. Chronic anticoagulation    PLAN:    In order of problems listed above:  Overall he is doing well with medical therapy for his cardiomyopathy and heart failure no hospitalizations no fluid overload has been intolerant of ACE and ARB but takes balance hydralazine isosorbide along with beta-blocker loop diuretic and MRA.  I approached about other medications including SGLT2 inhibitor and he declines we will recheck his echocardiogram. Await labs from the [facility] regarding his CKD Rate controlled atrial fibrillation continue his beta-blocker and his current anticoagulant. He also had CT of his thoracic aorta noncontrast.  I think he would be a poor surgical intervention candidate that would raise the issue unless greater than 55 mm   Next appointment: 9 months   Medication Adjustments/Labs and Tests Ordered: Current medicines are reviewed at length with the patient today.  Concerns regarding medicines are outlined above.  No orders of the defined types were placed in this encounter.  No orders of the defined types were placed in this encounter.  Chief complaint I thought I needed to see you in follow-up I been receiving care through the [facility]   History of Present Illness:    Kevin Lang is a 76 y.o. male with a hx of heart failure with ejection fraction in the range of 20 to 25% mild CAD COPD chronic atrial fibrillation anticoagulated sleep apnea on CPAP hypertensive heart disease and CKD last seen 01/02/2019.  Compliance with diet, lifestyle and medications: Yes  He has been receiving  his care through the [facility] I am not sure exactly how he ended up back here today he has had no dramatic change Records from the [facility] list his medications including Eliquis 5 mg twice daily atorvastatin 40 mg daily hydralazine 30 7/2 mg 3 times daily isosorbide dinitrate 20 mg 3 times daily Lopressor 100 mg twice daily potassium spironolactone 25 mg daily torsemide 20 mg 2 tablets daily He said he had labs 2 months ago they are all related to them is good I cannot see them in the [place] records in epic He has had no diagnostic cardiac testing Overall he is done well no hospitalizations no edema he is short of breath when he goes to the market no orthopnea chest pain palpitation or syncope no bleeding from his anticoagulant and no muscle pain or weakness with a statin Past Medical History:  Diagnosis Date   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 Mild non-obstructive CAD 2. Normal LV systolic function 3. Aortogram performed to eval aortic root aneurysm, 4.0 cm by this study.  CT Jan 2017, ascending aaorta aneurysm, 4.8 cm IMOUPDATE   Benign essential hypertension 02/16/2017   Cardiomyopathy ([place]) 08/23/2015   Chronic anticoagulation 05/21/2016   Chronic combined systolic and diastolic congestive heart failure ([place]) 02/16/2017   CKD (chronic kidney disease) stage 3, GFR 30-59 ml/min (HCC) 12/31/2015   COPD (chronic obstructive pulmonary disease) ([place]) 05/21/2016   Mild CAD 05/21/2016   Overview:  Cardiac cath 12/08/15:Conclusions Diagnostic Procedure Summary 1. Mild non-obstructive CAD 2. Normal LV systolic function 3. Aortogram performed to eval aortic root aneurysm, 4.0 cm by this study. Diagnostic Procedure Recommendations   Persistent atrial fibrillation ([place]) 02/16/2017    Past Surgical History:  Procedure Laterality Date   CORONARY ANGIOPLASTY      Current Medications: Current Meds  Medication Sig    apixaban (ELIQUIS) 5 MG TABS tablet Take 1 tablet (5 mg total) by mouth 2 (two) times daily.   atorvastatin (LIPITOR) 40 MG tablet Take 1 tablet (40 mg total) by mouth daily.   cromolyn (NASALCROM) 5.2 MG/ACT nasal spray Place 1 spray into both nostrils as needed.    hydrALAZINE (APRESOLINE) 25 MG tablet Take 37.5 mg by mouth 2 (two) times a day.   isosorbide dinitrate (ISORDIL) 40 MG tablet Take 1 tablet (40 mg total) by mouth 2 (two) times daily. Patient must keep appointment for 12/22/21 for further refills. 3 rd/ final attempt   metoprolol tartrate (LOPRESSOR) 100 MG tablet Take 1 tablet (100 mg total) by mouth 2 (two) times daily.   nitroGLYCERIN (NITROSTAT) 0.4 MG SL tablet Place 0.4 mg under the tongue every 5 (five) hours as needed for chest pain.   potassium chloride (K-DUR) 10 MEQ tablet Take 1 tablet (10 mEq total) by mouth daily.   spironolactone (ALDACTONE) 25 MG tablet Take 1 tablet (25 mg total) by mouth daily.   tamsulosin (FLOMAX) 0.4 MG CAPS capsule Take 1 capsule (0.4 mg total) by mouth 2 (two) times daily. (Patient taking differently: Take 0.4 mg by mouth as needed.)   torsemide (DEMADEX) 20 MG tablet Take 1 tablet (20 mg total) by mouth 2 (two) times daily.     Allergies:   Entresto [sacubitril-valsartan]   Social History   Socioeconomic History   Marital status: Married    Spouse name: Not on file   Number of children: Not on file   Years of education: Not on file   Highest education level: Not on file  Occupational History   Not on file  Tobacco Use   Smoking status: Former    Packs/day: 0.50    Years: 30.00    Pack years: 15.00    Types: Cigarettes    Quit date: 08/22/2000    Years since quitting: 21.3   Smokeless tobacco: Never  Vaping Use   Vaping Use: Never used  Substance and Sexual Activity   Alcohol use: No   Drug use: No   Sexual activity: Not on file  Other Topics Concern   Not on file  Social History Narrative   Not on file   Social  Determinants of Health   Financial Resource Strain: Not on file  Food Insecurity: Not on file  Transportation Needs: Not on file  Physical Activity: Not on file  Stress: Not on file  Social Connections: Not on file     Family History: The patient's family history includes Aneurysm in his father; Heart disease in his brother; Hypertension in his mother; Stroke in his mother. ROS:   Please see the history of present illness.    All other systems reviewed and are negative.  EKGs/Labs/Other Studies Reviewed:    The following studies were reviewed today:  EKG:  EKG ordered today and personally reviewed.  The ekg ordered today demonstrates atrial fibrillation controlled rate occasional PVCs nonspecific ST-T changes  Recent Labs: No results found for requested labs within last 8760 hours.  Recent Lipid Panel  Component Value Date/Time   CHOL 139 01/09/2019 0826   TRIG 132 01/09/2019 0826   HDL 42 01/09/2019 0826   CHOLHDL 3.3 01/09/2019 0826   LDLCALC 71 01/09/2019 0826    Physical Exam:    VS:  BP (!) 162/92 (BP Location: Left Arm, Patient Position: Sitting)   Pulse 82   Ht _0  (1.626 m)   Wt 236 lb 3.2 oz (107.1 kg)   SpO2 97%   BMI 40.54 kg/m     Wt Readings from Last 3 Encounters:  12/22/21 236 lb 3.2 oz (107.1 kg)  10/03/18 233 lb 9.6 oz (106 kg)  06/11/18 236 lb (107 kg)     GEN:  Well nourished, well developed in no acute distress HEENT: Normal NECK: No JVD; No carotid bruits LYMPHATICS: No lymphadenopathy CARDIAC: Irregular rate and rhythm no murmurs, rubs, gallops RESPIRATORY:  Clear to auscultation without rales, wheezing or rhonchi  ABDOMEN: Soft, non-tender, non-distended MUSCULOSKELETAL:  No edema; No deformity  SKIN: Warm and dry NEUROLOGIC:  Alert and oriented x 3 PSYCHIATRIC:  Normal affect    Signed, Shirlee More, MD  12/22/2021 2:39 PM    [place]

## 2021-12-23 ENCOUNTER — Ambulatory Visit (INDEPENDENT_AMBULATORY_CARE_PROVIDER_SITE_OTHER): Payer: Medicare PPO

## 2021-12-23 DIAGNOSIS — I5022 Chronic systolic (congestive) heart failure: Secondary | ICD-10-CM

## 2021-12-23 DIAGNOSIS — I4821 Permanent atrial fibrillation: Secondary | ICD-10-CM

## 2021-12-23 DIAGNOSIS — Z7901 Long term (current) use of anticoagulants: Secondary | ICD-10-CM | POA: Diagnosis not present

## 2021-12-23 DIAGNOSIS — I130 Hypertensive heart and chronic kidney disease with heart failure and stage 1 through stage 4 chronic kidney disease, or unspecified chronic kidney disease: Secondary | ICD-10-CM | POA: Diagnosis not present

## 2021-12-23 DIAGNOSIS — I7121 Aneurysm of the ascending aorta, without rupture: Secondary | ICD-10-CM

## 2021-12-23 LAB — ECHOCARDIOGRAM COMPLETE
Area-P 1/2: 4.63 cm2
MV M vel: 3.45 m/s
MV Peak grad: 47.6 mmHg
P 1/2 time: 1109 msec
S' Lateral: 3.9 cm

## 2021-12-23 NOTE — Telephone Encounter (Signed)
Patient needs follow up to check his weight before we can refill lasix or not per MD

## 2022-01-02 ENCOUNTER — Encounter (HOSPITAL_COMMUNITY): Payer: Self-pay

## 2022-01-02 ENCOUNTER — Emergency Department (HOSPITAL_COMMUNITY)
Admission: EM | Admit: 2022-01-02 | Discharge: 2022-01-02 | Disposition: A | Discharge status: Home / Self Care | Payer: Medicare HMO | Attending: Emergency Medicine | Admitting: Emergency Medicine

## 2022-01-02 ENCOUNTER — Telehealth (HOSPITAL_COMMUNITY): Payer: Self-pay | Admitting: Emergency Medicine

## 2022-01-02 ENCOUNTER — Other Ambulatory Visit: Payer: Self-pay

## 2022-01-02 DIAGNOSIS — M545 Low back pain, unspecified: Secondary | ICD-10-CM | POA: Diagnosis present

## 2022-01-02 DIAGNOSIS — R739 Hyperglycemia, unspecified: Secondary | ICD-10-CM | POA: Diagnosis not present

## 2022-01-02 LAB — CBC WITH DIFFERENTIAL/PLATELET
Abs Immature Granulocytes: 0.07 10*3/uL (ref 0.00–0.07)
Basophils Absolute: 0 10*3/uL (ref 0.0–0.1)
Basophils Relative: 0 %
Eosinophils Absolute: 0 10*3/uL (ref 0.0–0.5)
Eosinophils Relative: 0 %
HCT: 39.3 % (ref 39.0–52.0)
Hemoglobin: 13.2 g/dL (ref 13.0–17.0)
Immature Granulocytes: 1 %
Lymphocytes Relative: 7 %
Lymphs Abs: 0.7 10*3/uL (ref 0.7–4.0)
MCH: 31.1 pg (ref 26.0–34.0)
MCHC: 33.6 g/dL (ref 30.0–36.0)
MCV: 92.7 fL (ref 80.0–100.0)
Monocytes Absolute: 1 10*3/uL (ref 0.1–1.0)
Monocytes Relative: 10 %
Neutro Abs: 8.2 10*3/uL — ABNORMAL HIGH (ref 1.7–7.7)
Neutrophils Relative %: 82 %
Platelets: 239 10*3/uL (ref 150–400)
RBC: 4.24 MIL/uL (ref 4.22–5.81)
RDW: 15.9 % — ABNORMAL HIGH (ref 11.5–15.5)
WBC: 9.9 10*3/uL (ref 4.0–10.5)
nRBC: 0 % (ref 0.0–0.2)

## 2022-01-02 LAB — BASIC METABOLIC PANEL
Anion gap: 11 (ref 5–15)
BUN: 19 mg/dL (ref 8–23)
CO2: 22 mmol/L (ref 22–32)
Calcium: 9 mg/dL (ref 8.9–10.3)
Chloride: 102 mmol/L (ref 98–111)
Creatinine, Ser: 0.98 mg/dL (ref 0.61–1.24)
GFR, Estimated: >60 mL/min (ref >60)
Glucose, Bld: 301 mg/dL — ABNORMAL HIGH (ref 70–99)
Potassium: 3.8 mmol/L (ref 3.5–5.1)
Sodium: 135 mmol/L (ref 135–145)

## 2022-01-02 MED ORDER — TIZANIDINE HCL 2 MG PO CAPS: 2.0000 mg | ORAL_CAPSULE | Freq: Three times a day (TID) | ORAL | 0 refills | Status: DC | PRN

## 2022-01-02 MED ORDER — LIDOCAINE 5 % EX PTCH: 1.0000 | MEDICATED_PATCH | CUTANEOUS | 0 refills | Status: DC

## 2022-01-02 MED ORDER — LIDOCAINE 5 % EX PTCH
1.0000 | MEDICATED_PATCH | CUTANEOUS | Status: DC
  Administered 2022-01-02: 1 via TRANSDERMAL

## 2022-01-02 MED ORDER — DIAZEPAM 2 MG PO TABS
2.0000 mg | ORAL_TABLET | Freq: Once | ORAL | Status: AC
  Administered 2022-01-02: 2 mg via ORAL

## 2022-01-02 MED ORDER — SODIUM CHLORIDE 0.9 % IV BOLUS
1000.0000 mL | Freq: Once | INTRAVENOUS | Status: AC
  Administered 2022-01-02: 1000 mL via INTRAVENOUS

## 2022-01-02 MED ORDER — HYDROMORPHONE HCL 1 MG/ML IJ SOLN
1.0000 mg | Freq: Once | INTRAMUSCULAR | Status: AC
  Administered 2022-01-02: 1 mg via INTRAVENOUS
  Filled 2022-01-02: qty 1

## 2022-01-02 NOTE — ED Provider Notes (Signed)
[facility] DEPT Provider Note   CSN: [id number] Arrival date & time: 01/02/22  1053     History  Chief Complaint  Patient presents with   Back Pain    James [name]. is a 76 y.o. male.  Patient presents with low back pain, high blood sugar.  Patient with history of chronic back pain on multiple opioids for this.  Follows with pain team and spine team.  Has had multiple back surgeries.  He denies any loss of bowel or bladder.  States that he was doing some mild yard work couple days ago and then his back seem to tighten up on him the last several days.  Worse this morning.  No weakness.  Intermittently will have some tingling down his legs but not currently.  Movement makes it worse.  Home narcotics did not help much this morning.  Denies any nausea or vomiting or abdominal pain.  History of myeloma in remission.  The history is provided by the patient.      Home Medications Prior to Admission medications   Medication Sig Start Date End Date Taking? Authorizing Provider  lidocaine (LIDODERM) 5 % Place 1 patch onto the skin daily. Remove & Discard patch within 12 hours or as directed by MD 01/02/22  Yes Artesha Wemhoff, DO  tizanidine (ZANAFLEX) 2 MG capsule Take 1 capsule (2 mg total) by mouth 3 (three) times daily as needed for up to 20 doses for muscle spasms. 01/02/22  Yes Tawnya Pujol, DO  amLODipine (NORVASC) 10 MG tablet Take 10 mg by mouth every morning.     [provider]  aspirin-sod bicarb-citric acid (ALKA-SELTZER) 325 MG TBEF tablet Take 650 mg by mouth every 6 (six) hours as needed (indigestion / headaches).    [provider]  bismuth subsalicylate (PEPTO BISMOL) 262 MG/15ML suspension Take 30 mLs by mouth every 6 (six) hours as needed for indigestion or diarrhea or loose stools.    [provider]  Calcium Carbonate-Vitamin D (CALCIUM 600+D PO) Take 1 tablet by mouth in the morning and at bedtime.    [provider]  colestipol (COLESTID) 1 g tablet Take 1 g by mouth 2 (two) times daily. 02/11/20   [provider]  dexamethasone (DECADRON) 4 MG tablet TAKE 5 TABLETS BY MOUTH ONCE A WEEK 10/06/21   Wyatt Portela, MD  diphenhydrAMINE HCl (ZZZQUIL) 50 MG/30ML LIQD Take 30 mLs by mouth at bedtime as needed (sleep).    [provider]  Ferrous Fumarate (HEMOCYTE - 106 MG FE) 324 (106 Fe) MG TABS tablet Take 1 tablet by mouth 2 (two) times daily. 02/15/21   [provider]  finasteride (PROPECIA) 1 MG tablet Take 1 mg by mouth in the morning. 02/16/20   [provider]  Fluticasone-Umeclidin-Vilant (TRELEGY ELLIPTA) 100-62.5-25 MCG/ACT AEPB Inhale 1 puff into the lungs daily.    [provider]  furosemide (LASIX) 20 MG tablet Take 1 tablet (20 mg total) by mouth daily. 10/26/21   Maryjane Hurter, MD  gabapentin (NEURONTIN) 400 MG capsule Take 400-800 mg by mouth See admin instructions. Take '400mg'$  at lunch time and  2 Capsules (800 mg) at bedtime 08/26/15   [provider]  hydrochlorothiazide (HYDRODIURIL) 25 MG tablet Take 25 mg by mouth in the morning. 01/03/21   [provider]  HYDROcodone-acetaminophen (NORCO) 10-325 MG tablet Take 1 tablet by mouth in the morning, at noon, and at bedtime. 09/16/16   [provider]  [facility] 60 MG T24A Take 60 mg by mouth in the morning. Patient not taking: Reported on 10/26/2021 01/27/19   [provider]  insulin degludec (TRESIBA FLEXTOUCH) 200 UNIT/ML FlexTouch Pen Inject 30 Units into the skin daily after supper.    [provider]  lenalidomide (REVLIMID) 15 MG capsule [employer] Auth # [id number]     Date Obtained 12/19/2021 12/19/21   Wyatt Portela, MD  losartan (COZAAR) 50 MG tablet Take 50 mg by mouth every morning.     [provider]  Oxycodone HCl 20 MG TABS Take 20 mg by mouth every 8 (eight) hours as needed (pain.). 01/30/20   [provider]   pantoprazole (PROTONIX) 40 MG tablet Take 40 mg by mouth 2 (two) times daily. 12/14/20   [provider]  pioglitazone (ACTOS) 15 MG tablet Take 15 mg by mouth in the morning. 01/13/21   [provider]  RESTASIS 0.05 % ophthalmic emulsion Place 1 drop into both eyes 2 (two) times daily as needed for dry eyes. 11/27/19   [provider]  Tamsulosin HCl (FLOMAX) 0.4 MG CAPS Take 0.4 mg by mouth 2 (two) times daily.     [provider]  traMADol (ULTRAM) 50 MG tablet Take 50 mg by mouth 2 (two) times daily.    [provider]  TRESIBA FLEXTOUCH 100 UNIT/ML FlexTouch Pen Inject 30 Units into the skin daily. 02/14/21   [provider]  vitamin B-12 (CYANOCOBALAMIN) 1000 MCG tablet Take 1,000 mcg by mouth in the morning and at bedtime.    [provider]  terazosin (HYTRIN) 5 MG capsule Take 5 mg by mouth 2 (two) times daily.   10/25/11  [provider]      Allergies    Invokana [canagliflozin] and Ace inhibitors    Review of Systems   Review of Systems  Physical Exam Updated Vital Signs  ED Triage Vitals  Enc Vitals Group     BP 01/02/22 1111 (!) 163/73     Pulse Rate 01/02/22 1111 91     Resp 01/02/22 1111 18     Temp 01/02/22 1111 98.1 F (36.7 C)     Temp Source 01/02/22 1111 Oral     SpO2 01/02/22 1108 96 %     Weight 01/02/22 1113 215 lb (97.5 kg)     Height 01/02/22 1113 6' (1.829 m)     Head Circumference --      Peak Flow --      Pain Score 01/02/22 1111 10     Pain Loc --      Pain Edu? --      Excl. in [place]? --     Physical Exam Vitals and nursing note reviewed.  Constitutional:      General: He is not in acute distress.    Appearance: He is well-developed. He is not ill-appearing.  HENT:     Head: Normocephalic and atraumatic.     Nose: Nose normal.     Mouth/Throat:     Mouth: Mucous membranes are moist.  Eyes:     Extraocular Movements: Extraocular movements intact.     Conjunctiva/sclera:  Conjunctivae normal.     Pupils: Pupils are equal, round, and reactive to light.  Cardiovascular:     Rate and Rhythm: Normal rate and regular rhythm.     Pulses: Normal pulses.     Heart sounds: Normal heart sounds. No murmur heard. Pulmonary:     Effort:  Pulmonary effort is normal. No respiratory distress.     Breath sounds: Normal breath sounds.  Abdominal:     Palpations: Abdomen is soft.     Tenderness: There is no abdominal tenderness.  Musculoskeletal:        General: Tenderness present. No swelling.     Cervical back: Normal range of motion and neck supple.     Comments: Tenderness to the paraspinal lumbar muscles bilaterally with increased tone, no midline spinal tenderness  Skin:    General: Skin is warm and dry.     Capillary Refill: Capillary refill takes less than 2 seconds.  Neurological:     General: No focal deficit present.     Mental Status: He is alert.     Sensory: No sensory deficit.     Motor: No weakness.     Comments: 5+ out of 5 pain but limited due to pain, normal sensation  Psychiatric:        Mood and Affect: Mood normal.    ED Results / Procedures / Treatments   Labs (all labs ordered are listed, but only abnormal results are displayed) Labs Reviewed  CBC WITH DIFFERENTIAL/PLATELET - Abnormal; Notable for the following components:      Result Value   RDW 15.9 (*)    Neutro Abs 8.2 (*)    All other components within normal limits  BASIC METABOLIC PANEL - Abnormal; Notable for the following components:   Glucose, Bld 301 (*)    All other components within normal limits    EKG None  Radiology No results found.  Procedures Procedures    Medications Ordered in ED Medications  lidocaine (LIDODERM) 5 % 1 patch (1 patch Transdermal Patch Applied 01/02/22 1207)  sodium chloride 0.9 % bolus 1,000 mL (0 mLs Intravenous Stopped 01/02/22 1317)  diazepam (VALIUM) tablet 2 mg (2 mg Oral Given 01/02/22 1207)  HYDROmorphone (DILAUDID) injection 1 mg (1  mg Intravenous Given 01/02/22 1317)    ED Course/ Medical Decision Making/ A&P                           Medical Decision Making Amount and/or Complexity of Data Reviewed Labs: ordered.  Risk Prescription drug management.   James [name]. is here with acute on chronic back pain.  Home narcotics not helping today.  Normal vitals.  No fever.  Blood sugar is also elevated in the 350s he states.  He is on insulin.  History of myeloma but in remission.  Denies any abdominal pain, chest pain, shortness of breath.  Denies any weakness or numbness.  No symptoms of cauda equina.  He is tender in the paraspinal muscles in his lumbar spine.  No midline spinal tenderness.  Denies any specific trauma.  May be some increased activity doing yard work the other day that might of triggered his discomfort.  Used to get spinal injections but that doctor no longer in practice.  Has not been able to rearrange for these with his other providers.  Overall I have no concern for cauda equina.  We will check labs to make sure he is not in DKA.  Will give IV fluids.  We will start with Valium, lidocaine patch as he is already been given narcotics.  Overall he is not on any muscle relaxants.  We will try to make him comfortable and discharge him with a muscle relaxant to go along with his home narcotics.  Patient has  diabetes and do not think steroids would be safe.  He needs to follow-up with his chronic pain team as well as the spine team for ongoing support.  He does have a wheelchair and other supportive devices at home that he can use.  Neurologically he is intact.  He has normal pulses.  No concern for peripheral arterial disease or other intra-abdominal process.  Lab work significant per my review and interpretation of hyperglycemia at 301.  Not in DKA.  No significant anemia or electrolyte abnormality.  Feeling much better after Valium and narcotics.  Will prescribe muscle relaxant.  Recommend follow-up with his  chronic pain team.  This chart was dictated using voice recognition software.  Despite best efforts to proofread,  errors can occur which can change the documentation meaning.         Final Clinical Impression(s) / ED Diagnoses Final diagnoses:  Acute low back pain, unspecified back pain laterality, unspecified whether sciatica present    Rx / DC Orders ED Discharge Orders          Ordered    lidocaine (LIDODERM) 5 %  Every 24 hours        01/02/22 1338    tizanidine (ZANAFLEX) 2 MG capsule  3 times daily PRN        01/02/22 1338              Aldridge Krzyzanowski, [place], DO 01/02/22 1339

## 2022-01-02 NOTE — ED Triage Notes (Signed)
Pt coming from home via EMS with c/o lower back pain. Pt has chronic back pain. Pt states that his back pain has been worse since Saturday and unrelieved using his home pain medications. [place] patient.

## 2022-01-05 NOTE — Telephone Encounter (Signed)
Script sent to new pharmacy

## 2022-01-14 ENCOUNTER — Other Ambulatory Visit: Payer: Self-pay | Admitting: Oncology

## 2022-01-14 DIAGNOSIS — C9001 Multiple myeloma in remission: Secondary | ICD-10-CM

## 2022-02-01 ENCOUNTER — Inpatient Hospital Stay: Payer: Medicare HMO | Admitting: Oncology

## 2022-02-01 ENCOUNTER — Inpatient Hospital Stay: Payer: Medicare HMO | Attending: Oncology

## 2022-02-13 ENCOUNTER — Other Ambulatory Visit: Payer: Self-pay | Admitting: Oncology

## 2022-02-13 DIAGNOSIS — C9001 Multiple myeloma in remission: Secondary | ICD-10-CM

## 2022-02-20 ENCOUNTER — Other Ambulatory Visit: Payer: Self-pay | Admitting: *Deleted

## 2022-02-20 DIAGNOSIS — C9001 Multiple myeloma in remission: Secondary | ICD-10-CM

## 2022-02-20 MED ORDER — LENALIDOMIDE 15 MG PO CAPS: ORAL_CAPSULE | ORAL | 0 refills | Status: DC

## 2022-03-04 ENCOUNTER — Other Ambulatory Visit: Payer: Self-pay | Admitting: Student

## 2022-03-09 ENCOUNTER — Inpatient Hospital Stay: Payer: Medicare HMO | Attending: Oncology

## 2022-03-09 ENCOUNTER — Other Ambulatory Visit: Payer: Self-pay

## 2022-03-09 ENCOUNTER — Inpatient Hospital Stay (HOSPITAL_BASED_OUTPATIENT_CLINIC_OR_DEPARTMENT_OTHER): Payer: Medicare HMO | Admitting: Oncology

## 2022-03-09 VITALS — BP 134/69 | HR 98 | Temp 98.1°F | Resp 18 | Wt 208.8 lb

## 2022-03-09 DIAGNOSIS — C9001 Multiple myeloma in remission: Secondary | ICD-10-CM

## 2022-03-09 DIAGNOSIS — E876 Hypokalemia: Secondary | ICD-10-CM | POA: Diagnosis not present

## 2022-03-09 DIAGNOSIS — C9000 Multiple myeloma not having achieved remission: Secondary | ICD-10-CM | POA: Diagnosis present

## 2022-03-09 DIAGNOSIS — D649 Anemia, unspecified: Secondary | ICD-10-CM | POA: Insufficient documentation

## 2022-03-09 DIAGNOSIS — R0609 Other forms of dyspnea: Secondary | ICD-10-CM | POA: Diagnosis not present

## 2022-03-09 DIAGNOSIS — R600 Localized edema: Secondary | ICD-10-CM | POA: Diagnosis not present

## 2022-03-09 LAB — CBC WITH DIFFERENTIAL (CANCER CENTER ONLY)
Abs Immature Granulocytes: 0.04 10*3/uL (ref 0.00–0.07)
Basophils Absolute: 0 10*3/uL (ref 0.0–0.1)
Basophils Relative: 0 %
Eosinophils Absolute: 0.1 10*3/uL (ref 0.0–0.5)
Eosinophils Relative: 1 %
HCT: 33.2 % — ABNORMAL LOW (ref 39.0–52.0)
Hemoglobin: 11.4 g/dL — ABNORMAL LOW (ref 13.0–17.0)
Immature Granulocytes: 1 %
Lymphocytes Relative: 14 %
Lymphs Abs: 1.1 10*3/uL (ref 0.7–4.0)
MCH: 31.4 pg (ref 26.0–34.0)
MCHC: 34.3 g/dL (ref 30.0–36.0)
MCV: 91.5 fL (ref 80.0–100.0)
Monocytes Absolute: 0.6 10*3/uL (ref 0.1–1.0)
Monocytes Relative: 7 %
Neutro Abs: 6.4 10*3/uL (ref 1.7–7.7)
Neutrophils Relative %: 77 %
Platelet Count: 245 10*3/uL (ref 150–400)
RBC: 3.63 MIL/uL — ABNORMAL LOW (ref 4.22–5.81)
RDW: 17.2 % — ABNORMAL HIGH (ref 11.5–15.5)
WBC Count: 8.1 10*3/uL (ref 4.0–10.5)
nRBC: 0 % (ref 0.0–0.2)

## 2022-03-09 LAB — CMP (CANCER CENTER ONLY)
ALT: 15 U/L (ref 0–44)
AST: 12 U/L — ABNORMAL LOW (ref 15–41)
Albumin: 4.2 g/dL (ref 3.5–5.0)
Alkaline Phosphatase: 72 U/L (ref 38–126)
Anion gap: 11 (ref 5–15)
BUN: 22 mg/dL (ref 8–23)
CO2: 25 mmol/L (ref 22–32)
Calcium: 8.8 mg/dL — ABNORMAL LOW (ref 8.9–10.3)
Chloride: 100 mmol/L (ref 98–111)
Creatinine: 1.23 mg/dL (ref 0.61–1.24)
GFR, Estimated: >60 mL/min (ref >60)
Glucose, Bld: 163 mg/dL — ABNORMAL HIGH (ref 70–99)
Potassium: 3.5 mmol/L (ref 3.5–5.1)
Sodium: 136 mmol/L (ref 135–145)
Total Bilirubin: 0.4 mg/dL (ref 0.3–1.2)
Total Protein: 7.2 g/dL (ref 6.5–8.1)

## 2022-03-09 NOTE — Progress Notes (Signed)
Hematology and Oncology Follow Up Visit  James [name] [id number] Sep 16, 1945 76 y.o. 03/09/2022 3:34 PM [name], [name], [name], NP   Principle Diagnosis: 76 year old man with IgA kappa multiple myeloma diagnosed in 2017.  He was found to have 30% involvement in his bone marrow.    Prior therapy:  Revlimid and dexamethasone started on January 28 2016. Revlimid at 25 mg daily for 21 days with dexamethasone at 20 mg weekly. Therapy discontinued in January 2018 after achieving a complete response.  He developed relapsed disease in April 2020 with M spike of 3.9 g/dL IgM level of 4707.  Current therapy:   Revlimid 15 mg daily for 21 days out of a 28 day cycle with dexamethasone 40 mg weekly started in May 2020.  He achieved a complete response to therapy in May 2021.  Revlimid 5 mg daily 3 weeks on 1 week off started in May 2021.  Therapy discontinued in June 2021.  Revlimid 15 mg daily restarted in June 2021 as a maintenance and currently taking it with dexamethasone 8 mg weekly.   Interim History:  James [name] presents today for a follow-up.  Since last visit, he reports no major changes in his health.  He continues to have few complaints including musculoskeletal pain back and foot and did require emergency department visit in June 2023.  He continues to tolerate Revlimid and a reduced dose of dexamethasone.  His lower extremity edema continues to improve.  He does report some occasional dyspnea exertion.  He is still ambulating with the help of a cane.  He denies any falls or syncope.                 .    Medications: Reviewed without changes.  Current Outpatient Medications  Medication Sig Dispense Refill   amLODipine (NORVASC) 10 MG tablet Take 10 mg by mouth every morning.      aspirin-sod bicarb-citric acid (ALKA-SELTZER) 325 MG TBEF tablet Take 650 mg by mouth every 6 (six) hours as needed (indigestion / headaches).     bismuth subsalicylate (PEPTO  BISMOL) 262 MG/15ML suspension Take 30 mLs by mouth every 6 (six) hours as needed for indigestion or diarrhea or loose stools.     Calcium Carbonate-Vitamin D (CALCIUM 600+D PO) Take 1 tablet by mouth in the morning and at bedtime.     colestipol (COLESTID) 1 g tablet Take 1 g by mouth 2 (two) times daily.     dexamethasone (DECADRON) 4 MG tablet TAKE 5 TABLETS BY MOUTH ONCE A WEEK 64 tablet 0   diphenhydrAMINE HCl (ZZZQUIL) 50 MG/30ML LIQD Take 30 mLs by mouth at bedtime as needed (sleep).     Ferrous Fumarate (HEMOCYTE - 106 MG FE) 324 (106 Fe) MG TABS tablet Take 1 tablet by mouth 2 (two) times daily.     finasteride (PROPECIA) 1 MG tablet Take 1 mg by mouth in the morning.     Fluticasone-Umeclidin-Vilant (TRELEGY ELLIPTA) 100-62.5-25 MCG/ACT AEPB Inhale 1 puff into the lungs daily.     furosemide (LASIX) 20 MG tablet Take 1 tablet (20 mg total) by mouth daily. 30 tablet 2   gabapentin (NEURONTIN) 400 MG capsule Take 400-800 mg by mouth See admin instructions. Take 400mg  at lunch time and  2 Capsules (800 mg) at bedtime     hydrochlorothiazide (HYDRODIURIL) 25 MG tablet Take 25 mg by mouth in the morning.     HYDROcodone-acetaminophen (NORCO) 10-325 MG tablet Take 1 tablet by mouth in  the morning, at noon, and at bedtime.     HYSINGLA ER 60 MG T24A Take 60 mg by mouth in the morning. (Patient not taking: Reported on 10/26/2021)     insulin degludec (TRESIBA FLEXTOUCH) 200 UNIT/ML FlexTouch Pen Inject 30 Units into the skin daily after supper.     lenalidomide (REVLIMID) 15 MG capsule TAKE 1 CAPSULE BY MOUTH EVERY DAY FOR 21 DAYS ON THEN 7 DAYS OFF FOR A 28 DAY CYCLE 21 capsule 0   lidocaine (LIDODERM) 5 % Place 1 patch onto the skin daily. Remove & Discard patch within 12 hours or as directed by MD 30 patch 0   losartan (COZAAR) 50 MG tablet Take 50 mg by mouth every morning.      Oxycodone HCl 20 MG TABS Take 20 mg by mouth every 8 (eight) hours as needed (pain.).     pantoprazole (PROTONIX) 40  MG tablet Take 40 mg by mouth 2 (two) times daily.     pioglitazone (ACTOS) 15 MG tablet Take 15 mg by mouth in the morning.     RESTASIS 0.05 % ophthalmic emulsion Place 1 drop into both eyes 2 (two) times daily as needed for dry eyes.     Tamsulosin HCl (FLOMAX) 0.4 MG CAPS Take 0.4 mg by mouth 2 (two) times daily.      tizanidine (ZANAFLEX) 2 MG capsule Take 1 capsule (2 mg total) by mouth 3 (three) times daily as needed for up to 20 doses for muscle spasms. 20 capsule 0   traMADol (ULTRAM) 50 MG tablet Take 50 mg by mouth 2 (two) times daily.     TRESIBA FLEXTOUCH 100 UNIT/ML FlexTouch Pen Inject 30 Units into the skin daily.     vitamin B-12 (CYANOCOBALAMIN) 1000 MCG tablet Take 1,000 mcg by mouth in the morning and at bedtime.     No current facility-administered medications for this visit.    Allergies:  Allergies  Allergen Reactions   Invokana [Canagliflozin] Anaphylaxis    Facial/neck/ lip swelling   Ace Inhibitors Cough      Physical Exam:   Blood pressure 134/69, pulse 98, temperature 98.1 F (36.7 C), resp. rate 18, weight 208 lb 12.8 oz (94.7 kg), SpO2 95 %.   ECOG: 1      General appearance: Alert, awake without any distress. Head: Atraumatic without abnormalities Oropharynx: Without any thrush or ulcers. Eyes: No scleral icterus. Lymph nodes: No lymphadenopathy noted in the cervical, supraclavicular, or axillary nodes Heart:regular rate and rhythm, without any murmurs or gallops.  1+ edema noted to the level of shin. Lung: Clear to auscultation without any rhonchi, wheezes or dullness to percussion. Abdomin: Soft, nontender without any shifting dullness or ascites. Musculoskeletal: No clubbing or cyanosis. Neurological: No motor or sensory deficits. Skin: No rashes or lesions. Psychiatric: Mood and affect appeared normal.          .      Lab Results: Lab Results  Component Value Date   WBC 9.9 01/02/2022   HGB 13.2 01/02/2022   HCT 39.3  01/02/2022   MCV 92.7 01/02/2022   PLT 239 01/02/2022     Chemistry      Component Value Date/Time   NA 135 01/02/2022 1125   NA 138 06/26/2017 0946   K 3.8 01/02/2022 1125   K 3.8 06/26/2017 0946   CL 102 01/02/2022 1125   CL 105 08/09/2012 0918   CO2 22 01/02/2022 1125   CO2 25 06/26/2017 0946   BUN 19  01/02/2022 1125   BUN 16.4 06/26/2017 0946   CREATININE 0.98 01/02/2022 1125   CREATININE 0.96 11/01/2021 1445   CREATININE 1.2 06/26/2017 0946      Component Value Date/Time   CALCIUM 9.0 01/02/2022 1125   CALCIUM 9.3 06/26/2017 0946   ALKPHOS 67 11/01/2021 1445   ALKPHOS 75 06/26/2017 0946   AST 11 (L) 11/01/2021 1445   AST 11 06/26/2017 0946   ALT 11 11/01/2021 1445   ALT 14 06/26/2017 0946   BILITOT 0.4 11/01/2021 1445   BILITOT 0.31 06/26/2017 0946       Latest Reference Range & Units 03/30/21 09:22 07/29/21 09:10 11/01/21 14:45  Kappa free light chain 3.3 - 19.4 mg/L 37.3 (H) 17.0 19.2  Lambda free light chains 5.7 - 26.3 mg/L 20.2 10.1 11.1  Kappa, lambda light chain ratio 0.26 - 1.65  1.85 (H) 1.68 (H) 1.73 (H)  (H): Data is abnormally high    Latest Reference Range & Units 03/30/21 09:23 07/29/21 09:10 11/01/21 14:46  M Protein SerPl Elph-Mcnc Not Observed g/dL Not Observed (C) Not Observed (C) Not Observed (C)  IFE 1  Comment (C) Comment (C) Comment (C)  Globulin, Total 2.2 - 3.9 g/dL 2.7 (C) 2.4 (C) 2.2 (C)  B-Globulin SerPl Elph-Mcnc 0.7 - 1.3 g/dL 1.0 (C) 0.9 (C) 0.8 (C)  IgG (Immunoglobin G), Serum 603 - 1,613 mg/dL 559 (L) 502 (L) 484 (L)  IgM (Immunoglobulin M), Srm 15 - 143 mg/dL 14 (L) 13 (L) 10 (L)  IgA 61 - 437 mg/dL 157 117 108  (L): Data is abnormally low (C): Corrected   76 year old with:  1.  IgA kappa multiple myeloma diagnosed in 2017.  He is currently on Revlimid maintenance with excellent response.   Protein studies obtained in April 2023 were reviewed continues to show no evidence of M spike detected.  His IgA level remains  within normal range depressed IgG and IgM.  His kappa lambda light chain are within normal range with mild elevation in the ratio.  Risks and benefits of continuing this treatment at this time were discussed.  Complication associated with dexamethasone were reviewed.  I recommended continuing Revlimid and decreasing dexamethasone to 4 mg every week we will consider discontinuing it with the next visit.  2.  Chronic pain: He continues to be on hydrocodone with pain is manageable.  3.  Anemia: Hemoglobin currently at 11.4 which is slightly down from baseline but overall has been fluctuating.  This is related to plasma cell disorder versus Revlimid.  4.  Hypokalemia: We will continue to monitor on Revlimid and replace as needed.  5.  Dyspnea on exertion: Stable at this time although he does have some with increased exertion.  6.  Bilateral lower extremity edema weight gain: Improved with dexamethasone decreasing the dose and will continue to cut it down for the time being.  7.  Follow-up: In 3 months for repeat follow-up.  30  minutes were dedicated to this encounter.  The time was spent on reviewing laboratory data, disease status update and outlining future plan of care discussion.  Zola Button, MD 8/10/20233:34 PM

## 2022-03-10 LAB — KAPPA/LAMBDA LIGHT CHAINS
Kappa free light chain: 23.7 mg/L — ABNORMAL HIGH (ref 3.3–19.4)
Kappa, lambda light chain ratio: 1.67 — ABNORMAL HIGH (ref 0.26–1.65)
Lambda free light chains: 14.2 mg/L (ref 5.7–26.3)

## 2022-03-13 LAB — MULTIPLE MYELOMA PANEL, SERUM
Albumin SerPl Elph-Mcnc: 3.7 g/dL (ref 2.9–4.4)
Albumin/Glob SerPl: 1.4 (ref 0.7–1.7)
Alpha 1: 0.3 g/dL (ref 0.0–0.4)
Alpha2 Glob SerPl Elph-Mcnc: 0.9 g/dL (ref 0.4–1.0)
B-Globulin SerPl Elph-Mcnc: 1 g/dL (ref 0.7–1.3)
Gamma Glob SerPl Elph-Mcnc: 0.5 g/dL (ref 0.4–1.8)
Globulin, Total: 2.7 g/dL (ref 2.2–3.9)
IgA: 152 mg/dL (ref 61–437)
IgG (Immunoglobin G), Serum: 567 mg/dL — ABNORMAL LOW (ref 603–1613)
IgM (Immunoglobulin M), Srm: 8 mg/dL — ABNORMAL LOW (ref 15–143)
Total Protein ELP: 6.4 g/dL (ref 6.0–8.5)

## 2022-03-16 ENCOUNTER — Other Ambulatory Visit: Payer: Self-pay | Admitting: Oncology

## 2022-03-16 DIAGNOSIS — C9001 Multiple myeloma in remission: Secondary | ICD-10-CM

## 2022-03-21 ENCOUNTER — Other Ambulatory Visit: Payer: Self-pay | Admitting: *Deleted

## 2022-03-21 DIAGNOSIS — C9001 Multiple myeloma in remission: Secondary | ICD-10-CM

## 2022-03-21 MED ORDER — LENALIDOMIDE 15 MG PO CAPS: ORAL_CAPSULE | ORAL | 0 refills | Status: DC

## 2022-04-10 ENCOUNTER — Other Ambulatory Visit: Payer: Self-pay | Admitting: Oncology

## 2022-04-10 DIAGNOSIS — C9001 Multiple myeloma in remission: Secondary | ICD-10-CM

## 2022-04-13 ENCOUNTER — Other Ambulatory Visit: Payer: Self-pay | Admitting: *Deleted

## 2022-04-13 DIAGNOSIS — C9001 Multiple myeloma in remission: Secondary | ICD-10-CM

## 2022-04-13 MED ORDER — LENALIDOMIDE 15 MG PO CAPS: ORAL_CAPSULE | ORAL | 0 refills | Status: DC

## 2022-04-17 ENCOUNTER — Other Ambulatory Visit: Payer: Self-pay | Admitting: Oncology

## 2022-04-17 DIAGNOSIS — C9001 Multiple myeloma in remission: Secondary | ICD-10-CM

## 2022-05-11 ENCOUNTER — Telehealth: Payer: Self-pay | Admitting: *Deleted

## 2022-05-11 ENCOUNTER — Other Ambulatory Visit: Payer: Self-pay | Admitting: Oncology

## 2022-05-11 DIAGNOSIS — C9001 Multiple myeloma in remission: Secondary | ICD-10-CM

## 2022-05-11 NOTE — Telephone Encounter (Signed)
Faxed completed form to [facility]

## 2022-05-15 ENCOUNTER — Other Ambulatory Visit: Payer: Self-pay | Admitting: *Deleted

## 2022-05-15 DIAGNOSIS — C9001 Multiple myeloma in remission: Secondary | ICD-10-CM

## 2022-05-15 MED ORDER — LENALIDOMIDE 15 MG PO CAPS: ORAL_CAPSULE | ORAL | 1 refills | Status: DC

## 2022-05-15 MED ORDER — LENALIDOMIDE 15 MG PO CAPS: ORAL_CAPSULE | ORAL | 0 refills | Status: DC

## 2022-06-05 ENCOUNTER — Other Ambulatory Visit: Payer: Self-pay | Admitting: Oncology

## 2022-06-05 DIAGNOSIS — C9001 Multiple myeloma in remission: Secondary | ICD-10-CM

## 2022-06-08 ENCOUNTER — Inpatient Hospital Stay: Payer: Medicare HMO | Attending: Oncology

## 2022-06-08 ENCOUNTER — Inpatient Hospital Stay (HOSPITAL_BASED_OUTPATIENT_CLINIC_OR_DEPARTMENT_OTHER): Payer: Medicare HMO | Admitting: Oncology

## 2022-06-08 ENCOUNTER — Other Ambulatory Visit: Payer: Self-pay

## 2022-06-08 ENCOUNTER — Other Ambulatory Visit: Payer: Self-pay | Admitting: *Deleted

## 2022-06-08 VITALS — BP 165/69 | HR 87 | Temp 97.7°F | Resp 18 | Wt 210.6 lb

## 2022-06-08 DIAGNOSIS — C9000 Multiple myeloma not having achieved remission: Secondary | ICD-10-CM | POA: Insufficient documentation

## 2022-06-08 DIAGNOSIS — Z7961 Long term (current) use of immunomodulator: Secondary | ICD-10-CM | POA: Diagnosis not present

## 2022-06-08 DIAGNOSIS — C9001 Multiple myeloma in remission: Secondary | ICD-10-CM

## 2022-06-08 DIAGNOSIS — Z79899 Other long term (current) drug therapy: Secondary | ICD-10-CM | POA: Insufficient documentation

## 2022-06-08 LAB — CMP (CANCER CENTER ONLY)
ALT: 15 U/L (ref 0–44)
AST: 14 U/L — ABNORMAL LOW (ref 15–41)
Albumin: 3.9 g/dL (ref 3.5–5.0)
Alkaline Phosphatase: 71 U/L (ref 38–126)
Anion gap: 9 (ref 5–15)
BUN: 15 mg/dL (ref 8–23)
CO2: 27 mmol/L (ref 22–32)
Calcium: 8.6 mg/dL — ABNORMAL LOW (ref 8.9–10.3)
Chloride: 105 mmol/L (ref 98–111)
Creatinine: 1.31 mg/dL — ABNORMAL HIGH (ref 0.61–1.24)
GFR, Estimated: 56 mL/min — ABNORMAL LOW (ref >60)
Glucose, Bld: 150 mg/dL — ABNORMAL HIGH (ref 70–99)
Potassium: 3.4 mmol/L — ABNORMAL LOW (ref 3.5–5.1)
Sodium: 141 mmol/L (ref 135–145)
Total Bilirubin: 0.5 mg/dL (ref 0.3–1.2)
Total Protein: 6.4 g/dL — ABNORMAL LOW (ref 6.5–8.1)

## 2022-06-08 LAB — CBC WITH DIFFERENTIAL (CANCER CENTER ONLY)
Abs Immature Granulocytes: 0.03 10*3/uL (ref 0.00–0.07)
Basophils Absolute: 0.1 10*3/uL (ref 0.0–0.1)
Basophils Relative: 1 %
Eosinophils Absolute: 0.2 10*3/uL (ref 0.0–0.5)
Eosinophils Relative: 4 %
HCT: 35.8 % — ABNORMAL LOW (ref 39.0–52.0)
Hemoglobin: 11.9 g/dL — ABNORMAL LOW (ref 13.0–17.0)
Immature Granulocytes: 1 %
Lymphocytes Relative: 21 %
Lymphs Abs: 1.1 10*3/uL (ref 0.7–4.0)
MCH: 32.2 pg (ref 26.0–34.0)
MCHC: 33.2 g/dL (ref 30.0–36.0)
MCV: 96.8 fL (ref 80.0–100.0)
Monocytes Absolute: 0.7 10*3/uL (ref 0.1–1.0)
Monocytes Relative: 14 %
Neutro Abs: 3.1 10*3/uL (ref 1.7–7.7)
Neutrophils Relative %: 59 %
Platelet Count: 190 10*3/uL (ref 150–400)
RBC: 3.7 MIL/uL — ABNORMAL LOW (ref 4.22–5.81)
RDW: 14.8 % (ref 11.5–15.5)
WBC Count: 5.2 10*3/uL (ref 4.0–10.5)
nRBC: 0 % (ref 0.0–0.2)

## 2022-06-08 MED ORDER — LENALIDOMIDE 15 MG PO CAPS: ORAL_CAPSULE | ORAL | 1 refills | Status: DC

## 2022-06-08 NOTE — Progress Notes (Signed)
Hematology and Oncology Follow Up Visit  James [name] [id number] 1946/02/09 76 y.o. 06/08/2022 10:39 AM [name], [name], [name], NP   Principle Diagnosis: 76 year old man with multiple myeloma diagnosed in 2017.  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    Prior therapy:  Revlimid and dexamethasone started on January 28 2016. Revlimid at 25 mg daily for 21 days with dexamethasone at 20 mg weekly. Therapy discontinued in January 2018 after achieving a complete response.  He developed relapsed disease in April 2020 with M spike of 3.9 g/dL IgM level of 4707.  Revlimid 15 mg daily for 21 days out of a 28 day cycle with dexamethasone 40 mg weekly started in May 2020.  He achieved a complete response to therapy in May 2021.  Revlimid 5 mg daily 3 weeks on 1 week off started in May 2021.  Therapy discontinued in June 2021.   Current therapy: Maintenance Revlimid 15 mg daily restarted in June 2021 with dexamethasone 4 mg weekly.  He continues to be in remission at this time.   Interim History:  James [name] returns today for a repeat evaluation.  Since the last visit, he continues to tolerate current Revlimid dosing without any major complications.  He denies any nausea, fatigue or tiredness.  He does report chronic pain which is unchanged and mostly unrelated to his multiple myeloma.  He continues to ambulate without any difficulties.  He denies any falls or syncope.                 .    Medications: Updated on review.  Current Outpatient Medications  Medication Sig Dispense Refill   amLODipine (NORVASC) 10 MG tablet Take 10 mg by mouth every morning.      aspirin-sod bicarb-citric acid (ALKA-SELTZER) 325 MG TBEF tablet Take 650 mg by mouth every 6 (six) hours as needed (indigestion / headaches).     bismuth subsalicylate (PEPTO BISMOL) 262 MG/15ML suspension Take 30 mLs by mouth every 6 (six) hours as needed for indigestion or  diarrhea or loose stools.     Calcium Carbonate-Vitamin D (CALCIUM 600+D PO) Take 1 tablet by mouth in the morning and at bedtime.     colestipol (COLESTID) 1 g tablet Take 1 g by mouth 2 (two) times daily.     dexamethasone (DECADRON) 4 MG tablet TAKE 5 TABLETS BY MOUTH ONCE A WEEK 64 tablet 0   diphenhydrAMINE HCl (ZZZQUIL) 50 MG/30ML LIQD Take 30 mLs by mouth at bedtime as needed (sleep).     Ferrous Fumarate (HEMOCYTE - 106 MG FE) 324 (106 Fe) MG TABS tablet Take 1 tablet by mouth 2 (two) times daily.     finasteride (PROPECIA) 1 MG tablet Take 1 mg by mouth in the morning.     Fluticasone-Umeclidin-Vilant (TRELEGY ELLIPTA) 100-62.5-25 MCG/ACT AEPB Inhale 1 puff into the lungs daily.     furosemide (LASIX) 20 MG tablet Take 1 tablet (20 mg total) by mouth daily. 30 tablet 2   gabapentin (NEURONTIN) 400 MG capsule Take 400-800 mg by mouth See admin instructions. Take 433m at lunch time and  2 Capsules (800 mg) at bedtime     hydrochlorothiazide (HYDRODIURIL) 25 MG tablet Take 25 mg by mouth in the morning.     HYDROcodone-acetaminophen (NORCO) 10-325 MG tablet Take 1 tablet by mouth in the morning, at noon, and at bedtime.     HYSINGLA ER 60 MG T24A Take 60 mg by  mouth in the morning. (Patient not taking: Reported on 10/26/2021)     insulin degludec (TRESIBA FLEXTOUCH) 200 UNIT/ML FlexTouch Pen Inject 30 Units into the skin daily after supper.     lenalidomide (REVLIMID) 15 MG capsule Take 1 tablet daily for 21 days. 1 week off 21 capsule 1   lidocaine (LIDODERM) 5 % Place 1 patch onto the skin daily. Remove & Discard patch within 12 hours or as directed by MD 30 patch 0   losartan (COZAAR) 50 MG tablet Take 50 mg by mouth every morning.      Oxycodone HCl 20 MG TABS Take 20 mg by mouth every 8 (eight) hours as needed (pain.).     pantoprazole (PROTONIX) 40 MG tablet Take 40 mg by mouth 2 (two) times daily.     pioglitazone (ACTOS) 15 MG tablet Take 15 mg by mouth in the morning.     RESTASIS  0.05 % ophthalmic emulsion Place 1 drop into both eyes 2 (two) times daily as needed for dry eyes.     Tamsulosin HCl (FLOMAX) 0.4 MG CAPS Take 0.4 mg by mouth 2 (two) times daily.      tizanidine (ZANAFLEX) 2 MG capsule Take 1 capsule (2 mg total) by mouth 3 (three) times daily as needed for up to 20 doses for muscle spasms. 20 capsule 0   traMADol (ULTRAM) 50 MG tablet Take 50 mg by mouth 2 (two) times daily.     TRESIBA FLEXTOUCH 100 UNIT/ML FlexTouch Pen Inject 30 Units into the skin daily.     vitamin B-12 (CYANOCOBALAMIN) 1000 MCG tablet Take 1,000 mcg by mouth in the morning and at bedtime.     No current facility-administered medications for this visit.    Allergies:  Allergies  Allergen Reactions   Invokana [Canagliflozin] Anaphylaxis    Facial/neck/ lip swelling   Ace Inhibitors Cough      Physical Exam:      ECOG: 1    General appearance: Comfortable appearing without any discomfort Head: Normocephalic without any trauma Oropharynx: Mucous membranes are moist and pink without any thrush or ulcers. Eyes: Pupils are equal and round reactive to light. Lymph nodes: No cervical, supraclavicular, inguinal or axillary lymphadenopathy.   Heart:regular rate and rhythm.  S1 and S2 without leg edema. Lung: Clear without any rhonchi or wheezes.  No dullness to percussion. Abdomin: Soft, nontender, nondistended with good bowel sounds.  No hepatosplenomegaly. Musculoskeletal: No joint deformity or effusion.  Full range of motion noted. Neurological: No deficits noted on motor, sensory and deep tendon reflex exam. Skin: No petechial rash or dryness.  Appeared moist.            .      Lab Results: Lab Results  Component Value Date   WBC 5.2 06/08/2022   HGB 11.9 (L) 06/08/2022   HCT 35.8 (L) 06/08/2022   MCV 96.8 06/08/2022   PLT 190 06/08/2022     Chemistry      Component Value Date/Time   NA 136 03/09/2022 1519   NA 138 06/26/2017 0946   K 3.5  03/09/2022 1519   K 3.8 06/26/2017 0946   CL 100 03/09/2022 1519   CL 105 08/09/2012 0918   CO2 25 03/09/2022 1519   CO2 25 06/26/2017 0946   BUN 22 03/09/2022 1519   BUN 16.4 06/26/2017 0946   CREATININE 1.23 03/09/2022 1519   CREATININE 1.2 06/26/2017 0946      Component Value Date/Time   CALCIUM 8.8 (L)  03/09/2022 1519   CALCIUM 9.3 06/26/2017 0946   ALKPHOS 72 03/09/2022 1519   ALKPHOS 75 06/26/2017 0946   AST 12 (L) 03/09/2022 1519   AST 11 06/26/2017 0946   ALT 15 03/09/2022 1519   ALT 14 06/26/2017 0946   BILITOT 0.4 03/09/2022 1519   BILITOT 0.31 06/26/2017 0946       Latest Reference Range & Units 07/29/21 09:10 11/01/21 14:46 03/09/22 15:20  Total Protein ELP 6.0 - 8.5 g/dL 6.3 (C) 5.8 (L) (C) 6.4 (C)  Albumin SerPl Elph-Mcnc 2.9 - 4.4 g/dL 3.9 (C) 3.6 (C) 3.7 (C)  Albumin/Glob SerPl 0.7 - 1.7  1.7 (C) 1.7 (C) 1.4 (C)  Alpha2 Glob SerPl Elph-Mcnc 0.4 - 1.0 g/dL 0.9 (C) 0.8 (C) 0.9 (C)  Alpha 1 0.0 - 0.4 g/dL 0.2 (C) 0.2 (C) 0.3 (C)  Gamma Glob SerPl Elph-Mcnc 0.4 - 1.8 g/dL 0.4 (C) 0.4 (C) 0.5 (C)  M Protein SerPl Elph-Mcnc Not Observed g/dL Not Observed (C) Not Observed (C) Not Observed (C)  IFE 1  Comment (C) Comment (C) Comment (C)  Globulin, Total 2.2 - 3.9 g/dL 2.4 (C) 2.2 (C) 2.7 (C)  B-Globulin SerPl Elph-Mcnc 0.7 - 1.3 g/dL 0.9 (C) 0.8 (C) 1.0 (C)  IgG (Immunoglobin G), Serum 603 - 1,613 mg/dL 502 (L) 484 (L) 567 (L)  IgM (Immunoglobulin M), Srm 15 - 143 mg/dL 13 (L) 10 (L) 8 (L)  IgA 61 - 437 mg/dL 117 108 152  (L): Data is abnormally low (C): Corrected  76 year old with:  1.  Multiple myeloma diagnosed in 2017.  He was found to have IgA kappa and currently has achieved a near complete response.   The natural course of this disease was reviewed and treatment choices were discussed.  He is currently on Revlimid maintenance with protein studies continues to show normal IgA level and M spike that is undetected.  Serum light chains continues to show mild  elevation in his kappa light chain but overall reasonably controlled.  After discussion today, we opted to continue with Revlimid maintenance dosing and I instructed him to cut down the dexamethasone to every other week and subsequently discontinued.  2.  Chronic pain: He is currently prescribed hydrocodone by his neurologist.  Pain is manageable.  3.  Anemia: Related to plasma cell disorder and chronic disease.  His hemoglobin is adequate.  4.  Hypokalemia: Related to Revlimid and overall maintained at this time.  5.  Edema: Related to dexamethasone which improved by reducing the dose.  6.  Follow-up: In 3 months for repeat follow-up.  30  minutes were spent on this encounter.  Time was dedicated to reviewing his disease status, treatment choices and outlining future plan of care discussion.  Zola Button, MD 11/9/202310:39 AM

## 2022-06-09 LAB — KAPPA/LAMBDA LIGHT CHAINS
Kappa free light chain: 31.6 mg/L — ABNORMAL HIGH (ref 3.3–19.4)
Kappa, lambda light chain ratio: 2.39 — ABNORMAL HIGH (ref 0.26–1.65)
Lambda free light chains: 13.2 mg/L (ref 5.7–26.3)

## 2022-06-09 NOTE — Telephone Encounter (Signed)
Already filled

## 2022-06-13 LAB — MULTIPLE MYELOMA PANEL, SERUM
Albumin SerPl Elph-Mcnc: 3.6 g/dL (ref 2.9–4.4)
Albumin/Glob SerPl: 1.6 (ref 0.7–1.7)
Alpha 1: 0.2 g/dL (ref 0.0–0.4)
Alpha2 Glob SerPl Elph-Mcnc: 0.8 g/dL (ref 0.4–1.0)
B-Globulin SerPl Elph-Mcnc: 0.9 g/dL (ref 0.7–1.3)
Gamma Glob SerPl Elph-Mcnc: 0.5 g/dL (ref 0.4–1.8)
Globulin, Total: 2.4 g/dL (ref 2.2–3.9)
IgA: 147 mg/dL (ref 61–437)
IgG (Immunoglobin G), Serum: 605 mg/dL (ref 603–1613)
IgM (Immunoglobulin M), Srm: 15 mg/dL (ref 15–143)
Total Protein ELP: 6 g/dL (ref 6.0–8.5)

## 2022-07-03 ENCOUNTER — Other Ambulatory Visit: Payer: Self-pay | Admitting: Oncology

## 2022-07-03 DIAGNOSIS — C9001 Multiple myeloma in remission: Secondary | ICD-10-CM

## 2022-07-03 NOTE — Telephone Encounter (Signed)
[facility] # does not expire until 12/9

## 2022-07-06 ENCOUNTER — Other Ambulatory Visit: Payer: Self-pay | Admitting: *Deleted

## 2022-07-06 DIAGNOSIS — C9001 Multiple myeloma in remission: Secondary | ICD-10-CM

## 2022-07-06 MED ORDER — LENALIDOMIDE 15 MG PO CAPS: ORAL_CAPSULE | ORAL | 0 refills | Status: DC

## 2022-08-06 ENCOUNTER — Other Ambulatory Visit: Payer: Self-pay | Admitting: Oncology

## 2022-08-06 DIAGNOSIS — C9001 Multiple myeloma in remission: Secondary | ICD-10-CM

## 2022-08-08 ENCOUNTER — Other Ambulatory Visit: Payer: Self-pay | Admitting: Student

## 2022-08-08 ENCOUNTER — Other Ambulatory Visit: Payer: Self-pay | Admitting: *Deleted

## 2022-08-08 DIAGNOSIS — C9001 Multiple myeloma in remission: Secondary | ICD-10-CM

## 2022-08-08 MED ORDER — LENALIDOMIDE 15 MG PO CAPS: ORAL_CAPSULE | ORAL | 0 refills | Status: DC

## 2022-08-12 ENCOUNTER — Other Ambulatory Visit: Payer: Self-pay

## 2022-08-12 ENCOUNTER — Emergency Department (HOSPITAL_COMMUNITY)
Admission: EM | Admit: 2022-08-12 | Discharge: 2022-08-12 | Disposition: A | Discharge status: Home / Self Care | Payer: Medicare HMO | Attending: Emergency Medicine | Admitting: Emergency Medicine

## 2022-08-12 ENCOUNTER — Emergency Department (HOSPITAL_COMMUNITY): Payer: Medicare HMO

## 2022-08-12 DIAGNOSIS — I509 Heart failure, unspecified: Secondary | ICD-10-CM | POA: Diagnosis not present

## 2022-08-12 DIAGNOSIS — R0602 Shortness of breath: Secondary | ICD-10-CM

## 2022-08-12 DIAGNOSIS — I110 Hypertensive heart disease with heart failure: Secondary | ICD-10-CM | POA: Insufficient documentation

## 2022-08-12 DIAGNOSIS — E119 Type 2 diabetes mellitus without complications: Secondary | ICD-10-CM | POA: Diagnosis not present

## 2022-08-12 DIAGNOSIS — Z794 Long term (current) use of insulin: Secondary | ICD-10-CM | POA: Insufficient documentation

## 2022-08-12 DIAGNOSIS — Z79899 Other long term (current) drug therapy: Secondary | ICD-10-CM | POA: Diagnosis not present

## 2022-08-12 DIAGNOSIS — Z8579 Personal history of other malignant neoplasms of lymphoid, hematopoietic and related tissues: Secondary | ICD-10-CM | POA: Diagnosis not present

## 2022-08-12 DIAGNOSIS — I10 Essential (primary) hypertension: Secondary | ICD-10-CM

## 2022-08-12 DIAGNOSIS — R1031 Right lower quadrant pain: Secondary | ICD-10-CM | POA: Diagnosis not present

## 2022-08-12 LAB — COMPREHENSIVE METABOLIC PANEL WITH GFR
ALT: 23 U/L (ref 0–44)
AST: 23 U/L (ref 15–41)
Albumin: 4 g/dL (ref 3.5–5.0)
Alkaline Phosphatase: 84 U/L (ref 38–126)
Anion gap: 9 (ref 5–15)
BUN: 8 mg/dL (ref 8–23)
CO2: 25 mmol/L (ref 22–32)
Calcium: 8.8 mg/dL — ABNORMAL LOW (ref 8.9–10.3)
Chloride: 104 mmol/L (ref 98–111)
Creatinine, Ser: 1.01 mg/dL (ref 0.61–1.24)
GFR, Estimated: >60 mL/min (ref >60)
Glucose, Bld: 124 mg/dL — ABNORMAL HIGH (ref 70–99)
Potassium: 3.4 mmol/L — ABNORMAL LOW (ref 3.5–5.1)
Sodium: 138 mmol/L (ref 135–145)
Total Bilirubin: 0.5 mg/dL (ref 0.3–1.2)
Total Protein: 6.7 g/dL (ref 6.5–8.1)

## 2022-08-12 LAB — URINALYSIS, ROUTINE W REFLEX MICROSCOPIC
Bacteria, UA: NONE SEEN
Bilirubin Urine: NEGATIVE
Glucose, UA: NEGATIVE mg/dL
Ketones, ur: NEGATIVE mg/dL
Leukocytes,Ua: NEGATIVE
Nitrite: NEGATIVE
Protein, ur: NEGATIVE mg/dL
Specific Gravity, Urine: >1.046 — ABNORMAL HIGH (ref 1.005–1.030)
pH: 5 (ref 5.0–8.0)

## 2022-08-12 LAB — CBC WITH DIFFERENTIAL/PLATELET
Abs Immature Granulocytes: 0.02 K/uL (ref 0.00–0.07)
Basophils Absolute: 0.1 K/uL (ref 0.0–0.1)
Basophils Relative: 2 %
Eosinophils Absolute: 0.1 K/uL (ref 0.0–0.5)
Eosinophils Relative: 2 %
HCT: 39.2 % (ref 39.0–52.0)
Hemoglobin: 12.8 g/dL — ABNORMAL LOW (ref 13.0–17.0)
Immature Granulocytes: 0 %
Lymphocytes Relative: 21 %
Lymphs Abs: 1.2 K/uL (ref 0.7–4.0)
MCH: 30.9 pg (ref 26.0–34.0)
MCHC: 32.7 g/dL (ref 30.0–36.0)
MCV: 94.7 fL (ref 80.0–100.0)
Monocytes Absolute: 0.6 K/uL (ref 0.1–1.0)
Monocytes Relative: 12 %
Neutro Abs: 3.4 K/uL (ref 1.7–7.7)
Neutrophils Relative %: 63 %
Platelets: 256 K/uL (ref 150–400)
RBC: 4.14 MIL/uL — ABNORMAL LOW (ref 4.22–5.81)
RDW: 15.9 % — ABNORMAL HIGH (ref 11.5–15.5)
WBC: 5.4 K/uL (ref 4.0–10.5)
nRBC: 0 % (ref 0.0–0.2)

## 2022-08-12 LAB — LIPASE, BLOOD: Lipase: 28 U/L (ref 11–51)

## 2022-08-12 LAB — TROPONIN I (HIGH SENSITIVITY)
Troponin I (High Sensitivity): 12 ng/L (ref <18)
Troponin I (High Sensitivity): 14 ng/L (ref <18)

## 2022-08-12 MED ORDER — IOHEXOL 300 MG/ML  SOLN
100.0000 mL | Freq: Once | INTRAMUSCULAR | Status: AC | PRN
  Administered 2022-08-12: 100 mL via INTRAVENOUS

## 2022-08-12 MED ORDER — OXYCODONE-ACETAMINOPHEN 5-325 MG PO TABS
1.0000 | ORAL_TABLET | Freq: Once | ORAL | Status: AC
  Administered 2022-08-12: 1 via ORAL
  Filled 2022-08-12: qty 1

## 2022-08-12 NOTE — Discharge Instructions (Signed)
We saw you in the ER for shortness of breath. All the results in the ER are normal, labs and imaging. We are not sure what is causing your symptoms. The workup in the ER is not complete, and is limited to screening for life threatening and emergent conditions only, so please see a primary care doctor for further evaluation.  Please return to the ER if your symptoms worsen; you have increased pain, fevers, chills, inability to keep any medications down, confusion. Otherwise see the outpatient doctor as requested.

## 2022-08-12 NOTE — ED Provider Notes (Signed)
[name] DEPT Provider Note   CSN: [id number] Arrival date & time: 08/12/22  1155     History  Chief Complaint  Patient presents with   Shortness of Breath    James [name]. is a 77 y.o. male.  HPI    Pt comes in with cc of shob.  Patient has history of multiple myeloma, CHF, diabetes.  He states that he has chronic shortness of breath for which she is taking Trelegy.  But over the last 2 days he has had new shortness of breath and right-sided abdominal pain.  Patient shortness of breath is described as constant feeling that he is short of breath.  There is no exertional component to his symptoms necessarily.  He denies any chest pain, cough, URI-like symptoms.  Patient denies any bleeding or any history of anemia.  He does not have CHF and in the past was told that he has nonobstructive coronary artery disease.   Patient is complaining of right-sided abdominal pain.  He noticed this pain for about 2 days.  When he came to the emergency room, he received Percocet and states that while he was waiting he started feeling better from the shortness of breath perspective, no longer is short of breath.  Review of system is negative for any palpitations. Patient is noted to have elevated blood pressure.  States that his blood pressure has been running high the last few weeks.  He has been taking his BP medications.  He recently changed his PCP, they are supposed to send him to another specialist but he is not sure when that will occur.  He denies any lung disease history and denies any new wheezing.  Review of system negative for orthopnea, PND.  Home Medications Prior to Admission medications   Medication Sig Start Date End Date Taking? Authorizing Provider  amLODipine (NORVASC) 10 MG tablet Take 10 mg by mouth every morning.     [provider]  aspirin-sod bicarb-citric acid (ALKA-SELTZER) 325 MG TBEF tablet Take 650 mg by mouth every 6  (six) hours as needed (indigestion / headaches).    [provider]  bismuth subsalicylate (PEPTO BISMOL) 262 MG/15ML suspension Take 30 mLs by mouth every 6 (six) hours as needed for indigestion or diarrhea or loose stools.    [provider]  Calcium Carbonate-Vitamin D (CALCIUM 600+D PO) Take 1 tablet by mouth in the morning and at bedtime.    [provider]  colestipol (COLESTID) 1 g tablet Take 1 g by mouth 2 (two) times daily. 02/11/20   [provider]  dexamethasone (DECADRON) 4 MG tablet TAKE 5 TABLETS BY MOUTH ONCE A WEEK 04/17/22   Wyatt Portela, MD  diphenhydrAMINE HCl (ZZZQUIL) 50 MG/30ML LIQD Take 30 mLs by mouth at bedtime as needed (sleep).    [provider]  Ferrous Fumarate (HEMOCYTE - 106 MG FE) 324 (106 Fe) MG TABS tablet Take 1 tablet by mouth 2 (two) times daily. 02/15/21   [provider]  finasteride (PROPECIA) 1 MG tablet Take 1 mg by mouth in the morning. 02/16/20   [provider]  Fluticasone-Umeclidin-Vilant (TRELEGY ELLIPTA) 100-62.5-25 MCG/ACT AEPB Inhale 1 puff into the lungs daily.    [provider]  furosemide (LASIX) 20 MG tablet TAKE 1 TABLET BY MOUTH EVERY DAY 08/08/22   Maryjane Hurter, MD  gabapentin (NEURONTIN) 400 MG capsule Take 400-800 mg by mouth See admin instructions. Take '400mg'$  at lunch time and  2 Capsules (800 mg) at bedtime 08/26/15   [provider]  hydrochlorothiazide (HYDRODIURIL) 25 MG tablet Take 25 mg by mouth in the morning. 01/03/21   [provider]  HYDROcodone-acetaminophen (NORCO) 10-325 MG tablet Take 1 tablet by mouth in the morning, at noon, and at bedtime. 09/16/16   [provider]  [facility] ER 60 MG T24A Take 60 mg by mouth in the morning. Patient not taking: Reported on 10/26/2021 01/27/19   [provider]  insulin degludec (TRESIBA FLEXTOUCH) 200 UNIT/ML FlexTouch Pen Inject 30 Units into the skin daily after supper.     [provider]  lenalidomide (REVLIMID) 15 MG capsule Take one capsule every day for 21 days, then 7 days off 08/08/22   Wyatt Portela, MD  lidocaine (LIDODERM) 5 % Place 1 patch onto the skin daily. Remove & Discard patch within 12 hours or as directed by MD 01/02/22   Curatolo, Adam, DO  losartan (COZAAR) 50 MG tablet Take 50 mg by mouth every morning.     [provider]  Oxycodone HCl 20 MG TABS Take 20 mg by mouth every 8 (eight) hours as needed (pain.). 01/30/20   [provider]  pantoprazole (PROTONIX) 40 MG tablet Take 40 mg by mouth 2 (two) times daily. 12/14/20   [provider]  pioglitazone (ACTOS) 15 MG tablet Take 15 mg by mouth in the morning. 01/13/21   [provider]  RESTASIS 0.05 % ophthalmic emulsion Place 1 drop into both eyes 2 (two) times daily as needed for dry eyes. 11/27/19   [provider]  Tamsulosin HCl (FLOMAX) 0.4 MG CAPS Take 0.4 mg by mouth 2 (two) times daily.     [provider]  tizanidine (ZANAFLEX) 2 MG capsule Take 1 capsule (2 mg total) by mouth 3 (three) times daily as needed for up to 20 doses for muscle spasms. 01/02/22   Curatolo, Adam, DO  traMADol (ULTRAM) 50 MG tablet Take 50 mg by mouth 2 (two) times daily.    [provider]  TRESIBA FLEXTOUCH 100 UNIT/ML FlexTouch Pen Inject 30 Units into the skin daily. 02/14/21   [provider]  vitamin B-12 (CYANOCOBALAMIN) 1000 MCG tablet Take 1,000 mcg by mouth in the morning and at bedtime.    [provider]  terazosin (HYTRIN) 5 MG capsule Take 5 mg by mouth 2 (two) times daily.   10/25/11  [provider]      Allergies    Invokana [canagliflozin] and Ace inhibitors    Review of Systems   Review of Systems  Physical Exam Updated Vital Signs BP (!) 181/89 Comment: Will take home meds  Pulse 65   Temp 98.8 F (37.1 C) (Oral)   Resp 15   Ht 6' (1.829 m)   Wt 95 kg   SpO2 100%   BMI 28.40 kg/m  Physical  Exam Vitals and nursing note reviewed.  Constitutional:      Appearance: He is well-developed.  HENT:     Head: Atraumatic.  Cardiovascular:     Rate and Rhythm: Normal rate.  Pulmonary:     Effort: Pulmonary effort is normal.     Breath sounds: No wheezing or rales.  Abdominal:     Tenderness: There is abdominal tenderness.     Comments: Right lower quadrant tenderness with guarding  Musculoskeletal:     Cervical back: Neck supple.  Skin:    General: Skin is warm.  Neurological:  Mental Status: He is alert and oriented to person, place, and time.     ED Results / Procedures / Treatments   Labs (all labs ordered are listed, but only abnormal results are displayed) Labs Reviewed  COMPREHENSIVE METABOLIC PANEL - Abnormal; Notable for the following components:      Result Value   Potassium 3.4 (*)    Glucose, Bld 124 (*)    Calcium 8.8 (*)    All other components within normal limits  URINALYSIS, ROUTINE W REFLEX MICROSCOPIC - Abnormal; Notable for the following components:   Specific Gravity, Urine >1.046 (*)    Hgb urine dipstick SMALL (*)    All other components within normal limits  CBC WITH DIFFERENTIAL/PLATELET - Abnormal; Notable for the following components:   RBC 4.14 (*)    Hemoglobin 12.8 (*)    RDW 15.9 (*)    All other components within normal limits  LIPASE, BLOOD  TROPONIN I (HIGH SENSITIVITY)  TROPONIN I (HIGH SENSITIVITY)    EKG None  Radiology CT Angio Chest PE W and/or Wo Contrast  Result Date: 08/12/2022 CLINICAL DATA:  PE suspected, high probability EXAM: CT ANGIOGRAPHY CHEST WITH CONTRAST TECHNIQUE: Multidetector CT imaging of the chest was performed using the standard protocol during bolus administration of intravenous contrast. Multiplanar CT image reconstructions and MIPs were obtained to evaluate the vascular anatomy. RADIATION DOSE REDUCTION: This exam was performed according to the departmental dose-optimization program which includes  automated exposure control, adjustment of the mA and/or kV according to patient size and/or use of iterative reconstruction technique. CONTRAST:  159m OMNIPAQUE IOHEXOL 300 MG/ML  SOLN COMPARISON:  Prior CT chest 11/04/2021 FINDINGS: Cardiovascular: Satisfactory opacification of the pulmonary arteries to the segmental level. No evidence of pulmonary embolism. Normal heart size. No pericardial effusion. Aortic and coronary artery atherosclerotic vascular calcifications. Mediastinum/Nodes: Left-sided thyroid nodule again visualized. Nodule measures at least 2.3 cm. Otherwise, no mediastinal mass or suspicious lymphadenopathy. Lungs/Pleura: Respiratory motion artifact. Similar appearance of mild subpleural reticulation, architectural distortion and early honeycombing. Stable 1 cm subpleural pulmonary nodule affiliated with the anterolateral right middle lobe. No focal airspace infiltrate or consolidation. Dependent atelectasis. Upper Abdomen: No acute abnormality. Musculoskeletal: Multilevel chronic thoracic and lumbar compression fractures. Review of the MIP images confirms the above findings. IMPRESSION: 1. Negative for acute pulmonary embolus, pneumonia or other acute cardiopulmonary process. 2. Stable appearance of subpleural reticulation, architectural distortion and early honeycombing consistent with usual interstitial pneumonitis. 3. Aortic and coronary artery atherosclerotic vascular calcifications. 4. Left-sided thyroid nodule again noted. As before, dedicated thyroid ultrasound can further evaluate. 5. 8-10 mm right middle lobe pulmonary nodule again noted without significant interval change dating back to April of 2023. Recommend 1 additional follow-up CT scan in April 2025. Aortic Atherosclerosis (ICD10-I70.0). Electronically Signed   By: HJacqulynn CadetM.D.   On: 08/12/2022 15:21   CT ABDOMEN PELVIS W CONTRAST  Result Date: 08/12/2022 CLINICAL DATA:  Nonspecific abdominal pain EXAM: CT ABDOMEN AND  PELVIS WITH CONTRAST TECHNIQUE: Multidetector CT imaging of the abdomen and pelvis was performed using the standard protocol following bolus administration of intravenous contrast. RADIATION DOSE REDUCTION: This exam was performed according to the departmental dose-optimization program which includes automated exposure control, adjustment of the mA and/or kV according to patient size and/or use of iterative reconstruction technique. CONTRAST:  1039mOMNIPAQUE IOHEXOL 300 MG/ML  SOLN COMPARISON:  MRI lumbar spine 11/28/2016 FINDINGS: Lower chest: See concurrently obtained dedicated CT scan of the chest. Hepatobiliary: Normal hepatic contour and  morphology. The gallbladder is surgically absent. No intra or extra biliary ductal dilatation. Numerous circumscribed low-attenuation lesions throughout the liver consistent with simple cysts. Pancreas: Unremarkable. No pancreatic ductal dilatation or surrounding inflammatory changes. Spleen: Normal in size without focal abnormality. Adrenals/Urinary Tract: Normal adrenal glands. A low-attenuation renal lesions are present bilaterally. These are too small for accurate characterization but are statistically highly likely benign cysts. No hydronephrosis, nephrolithiasis or enhancing renal mass. No imaging follow-up recommended. Stomach/Bowel: Stomach is within normal limits. Appendix appears normal. No evidence of bowel wall thickening, distention, or inflammatory changes. Vascular/Lymphatic: Scattered atherosclerotic vascular calcifications. No aneurysm or dissection. Reproductive: Prostate is unremarkable. Other: No evidence of ascites.  Fat containing umbilical hernia. Musculoskeletal: Similar appearance of multiple chronic lumbar compression fractures most significant at L1. Additional fractures of the inferior endplate of L2, inferior endplate of L3 and superior endplate of L4 appears stable. IMPRESSION: 1. No acute abnormality within the abdomen or pelvis. 2. Hepatic and  renal artery cysts. 3. Multiple chronic compression fractures. 4. Fat containing umbilical hernia. 5. Aortic atherosclerosis.  Aortic Atherosclerosis (ICD10-I70.0). Electronically Signed   By: Jacqulynn Cadet M.D.   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  Although patient does not carry a chronic lung disease diagnosis, he has had shortness of breath chronically and is taking inhaler.  However over the last 2 days he has been having constant shortness of breath and air hunger type feeling.  He is also having new right-sided abdominal pain.  Patient was noted to be hypertensive at arrival.  Differential diagnosis considered includes pulmonary edema/CHF, valve disorder, arrhythmia, severe anemia, PE, flu or COVID.  Patient's history is not indicative of infection.  He has no cough, wheezing.  Lungs are clear right now.  No clear rales appreciated and patient denies any orthopnea, PND.  With reassuring cardiopulmonary exam, CT PE was completed, it is negative for PE and negative for any acute pneumonia or even evidence of pulmonary edema.   Patient also found to have right lower quadrant abdominal pain. CT abdomen pelvis ordered to rule out new metastatic disease, appendicitis, intra-abdominal infection, obstruction.  CT is negative for any acute findings.  Patient states that he received Percocet when he came, and while in the waiting room he started feeling better and is no  longer short of breath during my assessment.  I discussed with him the ER findings including delta troponins that are reassuring, CT scan results.  For now, the plan is for patient to follow-up with his PCP.  He will start taking BP measurements regularly.  I have provided him phone number for our accelerated hypertension clinic so that if the BP is hard to manage by his PCP then he has another resource.  I have independently reviewed the CT PE, there is no evidence of saddle pulmonary embolism. I have independently reviewed patient's lab results.  No profound  anemia, leukocytosis or elevated troponins.  Problems Addressed: Right lower quadrant abdominal pain: acute illness or injury Shortness of breath: undiagnosed new problem with uncertain prognosis Uncontrolled hypertension: complicated acute illness or injury    Final Clinical Impression(s) / ED Diagnoses Final diagnoses:  Shortness of breath  Right lower quadrant abdominal pain  Uncontrolled hypertension    Rx / DC Orders ED Discharge Orders     None         Varney Biles, MD 08/12/22 1940

## 2022-08-12 NOTE — ED Provider Triage Note (Signed)
Emergency Medicine Provider Triage Evaluation Note  James [name]. , a 77 y.o. male  was evaluated in triage.  Pt complains of shortness of breath for the last several days, reports that slightly worse than baseline, reports he has some shortness of breath with exertion normally, but is worsened over the last few days.  Patient does report history of tobacco smoking but does not currently smoke, denies previous history of COPD.  Patient also endorses some right lower quadrant pain, denies nausea, vomiting, anorexia, diarrhea, constipation.  He denies fever, chills.  He reports occasional chest pain, denies pleuritic or exertional chest pain.  Review of Systems  Positive: Shob, chest pain Negative: Nausea, vomiting  Physical Exam  BP (!) 196/105 (BP Location: Left Arm)   Pulse 99   Temp 98.3 F (36.8 C) (Oral)   Resp (!) 25   Ht 6' (1.829 m)   Wt 95 kg   SpO2 100%   BMI 28.40 kg/m  Gen:   Awake, no distress   Resp:  --- MSK:   Moves extremities without difficulty  Other:  Increased wob with some tachypnea, no wheezing, stridor, rhonchi, focal ttp in RLQ with some guarding  Medical Decision Making  Medically screening exam initiated at 12:17 PM.  Appropriate orders placed.  James [name]. was informed that the remainder of the evaluation will be completed by another provider, this initial triage assessment does not replace that evaluation, and the importance of remaining in the ED until their evaluation is complete.  Workup initiated   James [name], [place] 08/12/22 1218

## 2022-08-12 NOTE — ED Triage Notes (Signed)
Pt reports shortness of breath x 3 days. Pt currently on chemo last chemo 1/7. Pt also c/o right lower abd pain.

## 2022-08-18 ENCOUNTER — Telehealth: Payer: Self-pay | Admitting: Hematology and Oncology

## 2022-08-18 NOTE — Telephone Encounter (Signed)
Called patient regarding upcoming February appointments, patient is notified.

## 2022-08-19 ENCOUNTER — Encounter (HOSPITAL_COMMUNITY): Payer: Self-pay | Admitting: Emergency Medicine

## 2022-08-19 ENCOUNTER — Emergency Department (HOSPITAL_COMMUNITY)
Admission: EM | Admit: 2022-08-19 | Discharge: 2022-08-19 | Disposition: A | Discharge status: Home / Self Care | Payer: Medicare HMO | Attending: Emergency Medicine | Admitting: Emergency Medicine

## 2022-08-19 ENCOUNTER — Emergency Department (HOSPITAL_COMMUNITY): Payer: Medicare HMO

## 2022-08-19 ENCOUNTER — Other Ambulatory Visit: Payer: Self-pay

## 2022-08-19 DIAGNOSIS — I10 Essential (primary) hypertension: Secondary | ICD-10-CM | POA: Diagnosis not present

## 2022-08-19 DIAGNOSIS — Z7984 Long term (current) use of oral hypoglycemic drugs: Secondary | ICD-10-CM | POA: Diagnosis not present

## 2022-08-19 DIAGNOSIS — M4844XS Fatigue fracture of vertebra, thoracic region, sequela of fracture: Secondary | ICD-10-CM | POA: Insufficient documentation

## 2022-08-19 DIAGNOSIS — R109 Unspecified abdominal pain: Secondary | ICD-10-CM | POA: Insufficient documentation

## 2022-08-19 DIAGNOSIS — Z79899 Other long term (current) drug therapy: Secondary | ICD-10-CM | POA: Diagnosis not present

## 2022-08-19 DIAGNOSIS — M549 Dorsalgia, unspecified: Secondary | ICD-10-CM | POA: Diagnosis present

## 2022-08-19 DIAGNOSIS — E119 Type 2 diabetes mellitus without complications: Secondary | ICD-10-CM | POA: Insufficient documentation

## 2022-08-19 DIAGNOSIS — Z794 Long term (current) use of insulin: Secondary | ICD-10-CM | POA: Diagnosis not present

## 2022-08-19 LAB — CBC WITH DIFFERENTIAL/PLATELET
Abs Immature Granulocytes: 0.02 10*3/uL (ref 0.00–0.07)
Basophils Absolute: 0.1 10*3/uL (ref 0.0–0.1)
Basophils Relative: 2 %
Eosinophils Absolute: 0.1 10*3/uL (ref 0.0–0.5)
Eosinophils Relative: 2 %
HCT: 37.5 % — ABNORMAL LOW (ref 39.0–52.0)
Hemoglobin: 12.8 g/dL — ABNORMAL LOW (ref 13.0–17.0)
Immature Granulocytes: 0 %
Lymphocytes Relative: 25 %
Lymphs Abs: 1.1 10*3/uL (ref 0.7–4.0)
MCH: 32.2 pg (ref 26.0–34.0)
MCHC: 34.1 g/dL (ref 30.0–36.0)
MCV: 94.5 fL (ref 80.0–100.0)
Monocytes Absolute: 0.5 10*3/uL (ref 0.1–1.0)
Monocytes Relative: 12 %
Neutro Abs: 2.7 10*3/uL (ref 1.7–7.7)
Neutrophils Relative %: 59 %
Platelets: 258 10*3/uL (ref 150–400)
RBC: 3.97 MIL/uL — ABNORMAL LOW (ref 4.22–5.81)
RDW: 16.4 % — ABNORMAL HIGH (ref 11.5–15.5)
WBC: 4.6 10*3/uL (ref 4.0–10.5)
nRBC: 0.4 % — ABNORMAL HIGH (ref 0.0–0.2)

## 2022-08-19 LAB — I-STAT CHEM 8, ED
BUN: 8 mg/dL (ref 8–23)
Calcium, Ion: 1.17 mmol/L (ref 1.15–1.40)
Chloride: 104 mmol/L (ref 98–111)
Creatinine, Ser: 1.1 mg/dL (ref 0.61–1.24)
Glucose, Bld: 147 mg/dL — ABNORMAL HIGH (ref 70–99)
HCT: 38 % — ABNORMAL LOW (ref 39.0–52.0)
Hemoglobin: 12.9 g/dL — ABNORMAL LOW (ref 13.0–17.0)
Potassium: 3.5 mmol/L (ref 3.5–5.1)
Sodium: 139 mmol/L (ref 135–145)
TCO2: 23 mmol/L (ref 22–32)

## 2022-08-19 LAB — BASIC METABOLIC PANEL
Anion gap: 11 (ref 5–15)
BUN: 8 mg/dL (ref 8–23)
CO2: 22 mmol/L (ref 22–32)
Calcium: 9 mg/dL (ref 8.9–10.3)
Chloride: 105 mmol/L (ref 98–111)
Creatinine, Ser: 1.12 mg/dL (ref 0.61–1.24)
GFR, Estimated: >60 mL/min (ref >60)
Glucose, Bld: 148 mg/dL — ABNORMAL HIGH (ref 70–99)
Potassium: 3.6 mmol/L (ref 3.5–5.1)
Sodium: 138 mmol/L (ref 135–145)

## 2022-08-19 MED ORDER — CYCLOBENZAPRINE HCL 5 MG PO TABS: 5.0000 mg | ORAL_TABLET | Freq: Three times a day (TID) | ORAL | 0 refills | Status: DC | PRN

## 2022-08-19 MED ORDER — HYDROMORPHONE HCL 1 MG/ML IJ SOLN
1.0000 mg | Freq: Once | INTRAMUSCULAR | Status: AC
  Administered 2022-08-19: 1 mg via INTRAVENOUS
  Filled 2022-08-19: qty 1

## 2022-08-19 MED ORDER — MORPHINE SULFATE (PF) 4 MG/ML IV SOLN
4.0000 mg | Freq: Once | INTRAVENOUS | Status: AC
  Administered 2022-08-19: 4 mg via INTRAVENOUS
  Filled 2022-08-19: qty 1

## 2022-08-19 MED ORDER — IOHEXOL 350 MG/ML SOLN
75.0000 mL | Freq: Once | INTRAVENOUS | Status: AC | PRN
  Administered 2022-08-19: 75 mL via INTRAVENOUS

## 2022-08-19 MED ORDER — ONDANSETRON HCL 4 MG/2ML IJ SOLN
4.0000 mg | Freq: Once | INTRAMUSCULAR | Status: AC
  Administered 2022-08-19: 4 mg via INTRAVENOUS
  Filled 2022-08-19: qty 2

## 2022-08-19 NOTE — ED Notes (Signed)
Patient verbalizes understanding of discharge instructions. Opportunity for questioning and answers were provided. Armband removed by staff, pt discharged from ED. Pt taken to ED entrance via wheel chair.  
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
## 2022-08-23 ENCOUNTER — Emergency Department (HOSPITAL_COMMUNITY): Payer: Medicare HMO

## 2022-08-23 ENCOUNTER — Inpatient Hospital Stay (HOSPITAL_COMMUNITY): Payer: Medicare HMO

## 2022-08-23 ENCOUNTER — Inpatient Hospital Stay (HOSPITAL_COMMUNITY)
Admission: EM | Admit: 2022-08-23 | Discharge: 2022-08-26 | DRG: 176 | Disposition: A | Discharge status: Home Health | Payer: Medicare HMO | Attending: Internal Medicine | Admitting: Internal Medicine

## 2022-08-23 ENCOUNTER — Other Ambulatory Visit: Payer: Self-pay

## 2022-08-23 DIAGNOSIS — Z8249 Family history of ischemic heart disease and other diseases of the circulatory system: Secondary | ICD-10-CM

## 2022-08-23 DIAGNOSIS — S22000A Wedge compression fracture of unspecified thoracic vertebra, initial encounter for closed fracture: Secondary | ICD-10-CM | POA: Diagnosis not present

## 2022-08-23 DIAGNOSIS — M549 Dorsalgia, unspecified: Secondary | ICD-10-CM | POA: Diagnosis not present

## 2022-08-23 DIAGNOSIS — N189 Chronic kidney disease, unspecified: Secondary | ICD-10-CM | POA: Diagnosis present

## 2022-08-23 DIAGNOSIS — S32000A Wedge compression fracture of unspecified lumbar vertebra, initial encounter for closed fracture: Secondary | ICD-10-CM | POA: Diagnosis not present

## 2022-08-23 DIAGNOSIS — I130 Hypertensive heart and chronic kidney disease with heart failure and stage 1 through stage 4 chronic kidney disease, or unspecified chronic kidney disease: Secondary | ICD-10-CM | POA: Diagnosis present

## 2022-08-23 DIAGNOSIS — E785 Hyperlipidemia, unspecified: Secondary | ICD-10-CM | POA: Diagnosis present

## 2022-08-23 DIAGNOSIS — R0603 Acute respiratory distress: Secondary | ICD-10-CM | POA: Diagnosis present

## 2022-08-23 DIAGNOSIS — I160 Hypertensive urgency: Secondary | ICD-10-CM | POA: Diagnosis present

## 2022-08-23 DIAGNOSIS — Z888 Allergy status to other drugs, medicaments and biological substances status: Secondary | ICD-10-CM | POA: Diagnosis not present

## 2022-08-23 DIAGNOSIS — Z7984 Long term (current) use of oral hypoglycemic drugs: Secondary | ICD-10-CM

## 2022-08-23 DIAGNOSIS — E1122 Type 2 diabetes mellitus with diabetic chronic kidney disease: Secondary | ICD-10-CM | POA: Diagnosis present

## 2022-08-23 DIAGNOSIS — E118 Type 2 diabetes mellitus with unspecified complications: Secondary | ICD-10-CM

## 2022-08-23 DIAGNOSIS — M8458XD Pathological fracture in neoplastic disease, other specified site, subsequent encounter for fracture with routine healing: Secondary | ICD-10-CM | POA: Diagnosis present

## 2022-08-23 DIAGNOSIS — R9431 Abnormal electrocardiogram [ECG] [EKG]: Secondary | ICD-10-CM | POA: Diagnosis not present

## 2022-08-23 DIAGNOSIS — N400 Enlarged prostate without lower urinary tract symptoms: Secondary | ICD-10-CM | POA: Diagnosis present

## 2022-08-23 DIAGNOSIS — Z83438 Family history of other disorder of lipoprotein metabolism and other lipidemia: Secondary | ICD-10-CM

## 2022-08-23 DIAGNOSIS — Z9049 Acquired absence of other specified parts of digestive tract: Secondary | ICD-10-CM

## 2022-08-23 DIAGNOSIS — K529 Noninfective gastroenteritis and colitis, unspecified: Secondary | ICD-10-CM | POA: Diagnosis present

## 2022-08-23 DIAGNOSIS — Z794 Long term (current) use of insulin: Secondary | ICD-10-CM | POA: Diagnosis not present

## 2022-08-23 DIAGNOSIS — Z87891 Personal history of nicotine dependence: Secondary | ICD-10-CM | POA: Diagnosis not present

## 2022-08-23 DIAGNOSIS — Z1152 Encounter for screening for COVID-19: Secondary | ICD-10-CM

## 2022-08-23 DIAGNOSIS — K219 Gastro-esophageal reflux disease without esophagitis: Secondary | ICD-10-CM | POA: Diagnosis present

## 2022-08-23 DIAGNOSIS — Z79899 Other long term (current) drug therapy: Secondary | ICD-10-CM | POA: Diagnosis not present

## 2022-08-23 DIAGNOSIS — I2693 Single subsegmental pulmonary embolism without acute cor pulmonale: Principal | ICD-10-CM | POA: Diagnosis present

## 2022-08-23 DIAGNOSIS — I4510 Unspecified right bundle-branch block: Secondary | ICD-10-CM | POA: Diagnosis present

## 2022-08-23 DIAGNOSIS — G4733 Obstructive sleep apnea (adult) (pediatric): Secondary | ICD-10-CM | POA: Diagnosis present

## 2022-08-23 DIAGNOSIS — C9001 Multiple myeloma in remission: Secondary | ICD-10-CM

## 2022-08-23 DIAGNOSIS — J849 Interstitial pulmonary disease, unspecified: Secondary | ICD-10-CM | POA: Diagnosis present

## 2022-08-23 DIAGNOSIS — I5032 Chronic diastolic (congestive) heart failure: Secondary | ICD-10-CM | POA: Diagnosis present

## 2022-08-23 DIAGNOSIS — E041 Nontoxic single thyroid nodule: Secondary | ICD-10-CM | POA: Diagnosis present

## 2022-08-23 DIAGNOSIS — R7989 Other specified abnormal findings of blood chemistry: Secondary | ICD-10-CM | POA: Diagnosis not present

## 2022-08-23 DIAGNOSIS — I2699 Other pulmonary embolism without acute cor pulmonale: Secondary | ICD-10-CM | POA: Diagnosis present

## 2022-08-23 DIAGNOSIS — N179 Acute kidney failure, unspecified: Secondary | ICD-10-CM | POA: Diagnosis present

## 2022-08-23 DIAGNOSIS — C9000 Multiple myeloma not having achieved remission: Secondary | ICD-10-CM | POA: Diagnosis present

## 2022-08-23 DIAGNOSIS — R911 Solitary pulmonary nodule: Secondary | ICD-10-CM | POA: Diagnosis present

## 2022-08-23 DIAGNOSIS — Z8619 Personal history of other infectious and parasitic diseases: Secondary | ICD-10-CM

## 2022-08-23 DIAGNOSIS — J449 Chronic obstructive pulmonary disease, unspecified: Secondary | ICD-10-CM | POA: Diagnosis present

## 2022-08-23 DIAGNOSIS — I959 Hypotension, unspecified: Secondary | ICD-10-CM | POA: Diagnosis present

## 2022-08-23 DIAGNOSIS — Z856 Personal history of leukemia: Secondary | ICD-10-CM

## 2022-08-23 DIAGNOSIS — R4182 Altered mental status, unspecified: Secondary | ICD-10-CM | POA: Diagnosis present

## 2022-08-23 DIAGNOSIS — G8929 Other chronic pain: Secondary | ICD-10-CM | POA: Diagnosis present

## 2022-08-23 LAB — COMPREHENSIVE METABOLIC PANEL
ALT: 18 U/L (ref 0–44)
AST: 27 U/L (ref 15–41)
Albumin: 3.9 g/dL (ref 3.5–5.0)
Alkaline Phosphatase: 88 U/L (ref 38–126)
Anion gap: 12 (ref 5–15)
BUN: 6 mg/dL — ABNORMAL LOW (ref 8–23)
CO2: 20 mmol/L — ABNORMAL LOW (ref 22–32)
Calcium: 9.2 mg/dL (ref 8.9–10.3)
Chloride: 104 mmol/L (ref 98–111)
Creatinine, Ser: 1.11 mg/dL (ref 0.61–1.24)
GFR, Estimated: >60 mL/min (ref >60)
Glucose, Bld: 134 mg/dL — ABNORMAL HIGH (ref 70–99)
Potassium: 3.4 mmol/L — ABNORMAL LOW (ref 3.5–5.1)
Sodium: 136 mmol/L (ref 135–145)
Total Bilirubin: 1.1 mg/dL (ref 0.3–1.2)
Total Protein: 6.7 g/dL (ref 6.5–8.1)

## 2022-08-23 LAB — RESP PANEL BY RT-PCR (RSV, FLU A&B, COVID)  RVPGX2
Influenza A by PCR: NEGATIVE
Influenza B by PCR: NEGATIVE
Resp Syncytial Virus by PCR: NEGATIVE
SARS Coronavirus 2 by RT PCR: NEGATIVE

## 2022-08-23 LAB — ECHOCARDIOGRAM COMPLETE
AR max vel: 2.9 cm2
AV Area VTI: 2.97 cm2
AV Area mean vel: 2.84 cm2
AV Mean grad: 3 mmHg
AV Peak grad: 6.8 mmHg
Ao pk vel: 1.3 m/s
Height: 72 in
S' Lateral: 3.7 cm
Weight: 3360 oz

## 2022-08-23 LAB — I-STAT CHEM 8, ED
BUN: 6 mg/dL — ABNORMAL LOW (ref 8–23)
Calcium, Ion: 1.15 mmol/L (ref 1.15–1.40)
Chloride: 103 mmol/L (ref 98–111)
Creatinine, Ser: 1 mg/dL (ref 0.61–1.24)
Glucose, Bld: 132 mg/dL — ABNORMAL HIGH (ref 70–99)
HCT: 41 % (ref 39.0–52.0)
Hemoglobin: 13.9 g/dL (ref 13.0–17.0)
Potassium: 3.5 mmol/L (ref 3.5–5.1)
Sodium: 139 mmol/L (ref 135–145)
TCO2: 23 mmol/L (ref 22–32)

## 2022-08-23 LAB — CBC WITH DIFFERENTIAL/PLATELET
Abs Immature Granulocytes: 0.02 10*3/uL (ref 0.00–0.07)
Basophils Absolute: 0.1 10*3/uL (ref 0.0–0.1)
Basophils Relative: 1 %
Eosinophils Absolute: 0.1 10*3/uL (ref 0.0–0.5)
Eosinophils Relative: 1 %
HCT: 40.8 % (ref 39.0–52.0)
Hemoglobin: 13.6 g/dL (ref 13.0–17.0)
Immature Granulocytes: 0 %
Lymphocytes Relative: 25 %
Lymphs Abs: 1.8 10*3/uL (ref 0.7–4.0)
MCH: 31.3 pg (ref 26.0–34.0)
MCHC: 33.3 g/dL (ref 30.0–36.0)
MCV: 94 fL (ref 80.0–100.0)
Monocytes Absolute: 0.5 10*3/uL (ref 0.1–1.0)
Monocytes Relative: 7 %
Neutro Abs: 4.9 10*3/uL (ref 1.7–7.7)
Neutrophils Relative %: 66 %
Platelets: 274 10*3/uL (ref 150–400)
RBC: 4.34 MIL/uL (ref 4.22–5.81)
RDW: 16 % — ABNORMAL HIGH (ref 11.5–15.5)
WBC: 7.4 10*3/uL (ref 4.0–10.5)
nRBC: 0 % (ref 0.0–0.2)

## 2022-08-23 LAB — CBG MONITORING, ED
Glucose-Capillary: 268 mg/dL — ABNORMAL HIGH (ref 70–99)
Glucose-Capillary: 130 mg/dL — ABNORMAL HIGH (ref 70–99)
Glucose-Capillary: 133 mg/dL — ABNORMAL HIGH (ref 70–99)

## 2022-08-23 LAB — TROPONIN I (HIGH SENSITIVITY)
Troponin I (High Sensitivity): 17 ng/L (ref <18)
Troponin I (High Sensitivity): 22 ng/L — ABNORMAL HIGH (ref <18)

## 2022-08-23 LAB — HEPARIN LEVEL (UNFRACTIONATED)
Heparin Unfractionated: 0.5 IU/mL (ref 0.30–0.70)
Heparin Unfractionated: 0.56 IU/mL (ref 0.30–0.70)

## 2022-08-23 LAB — URINALYSIS, ROUTINE W REFLEX MICROSCOPIC
Bacteria, UA: NONE SEEN
Bilirubin Urine: NEGATIVE
Glucose, UA: >=500 mg/dL — AB
Ketones, ur: 5 mg/dL — AB
Leukocytes,Ua: NEGATIVE
Nitrite: NEGATIVE
Protein, ur: 100 mg/dL — AB
Specific Gravity, Urine: 1.02 (ref 1.005–1.030)
pH: 6 (ref 5.0–8.0)

## 2022-08-23 LAB — RAPID URINE DRUG SCREEN, HOSP PERFORMED
Amphetamines: NOT DETECTED
Barbiturates: NOT DETECTED
Benzodiazepines: NOT DETECTED
Cocaine: NOT DETECTED
Opiates: POSITIVE — AB
Tetrahydrocannabinol: NOT DETECTED

## 2022-08-23 LAB — HEMOGLOBIN A1C
Hgb A1c MFr Bld: 6 % — ABNORMAL HIGH (ref 4.8–5.6)
Mean Plasma Glucose: 125.5 mg/dL

## 2022-08-23 LAB — BRAIN NATRIURETIC PEPTIDE: B Natriuretic Peptide: 103 pg/mL — ABNORMAL HIGH (ref 0.0–100.0)

## 2022-08-23 MED ORDER — SODIUM CHLORIDE 0.9 % IV SOLN: INTRAVENOUS | Status: DC

## 2022-08-23 MED ORDER — AMLODIPINE BESYLATE 10 MG PO TABS
10.0000 mg | ORAL_TABLET | Freq: Every morning | ORAL | Status: DC
  Administered 2022-08-24: 10 mg via ORAL
  Filled 2022-08-23: qty 1

## 2022-08-23 MED ORDER — GABAPENTIN 400 MG PO CAPS
400.0000 mg | ORAL_CAPSULE | Freq: Every day | ORAL | Status: DC
  Administered 2022-08-24 – 2022-08-26 (×3): 400 mg via ORAL
  Filled 2022-08-23 (×3): qty 1

## 2022-08-23 MED ORDER — HEPARIN (PORCINE) 25000 UT/250ML-% IV SOLN
1500.0000 [IU]/h | INTRAVENOUS | Status: DC
  Administered 2022-08-23 (×2): 1500 [IU]/h via INTRAVENOUS
  Filled 2022-08-23 (×2): qty 250

## 2022-08-23 MED ORDER — IOHEXOL 350 MG/ML SOLN
75.0000 mL | Freq: Once | INTRAVENOUS | Status: AC | PRN
  Administered 2022-08-23: 75 mL via INTRAVENOUS

## 2022-08-23 MED ORDER — ONDANSETRON HCL 4 MG/2ML IJ SOLN: 4.0000 mg | Freq: Four times a day (QID) | INTRAMUSCULAR | Status: DC | PRN

## 2022-08-23 MED ORDER — TRAZODONE HCL 50 MG PO TABS
25.0000 mg | ORAL_TABLET | Freq: Every evening | ORAL | Status: DC | PRN
  Administered 2022-08-24 – 2022-08-25 (×2): 25 mg via ORAL
  Filled 2022-08-23 (×3): qty 1

## 2022-08-23 MED ORDER — HALOPERIDOL LACTATE 5 MG/ML IJ SOLN
1.0000 mg | Freq: Once | INTRAMUSCULAR | Status: AC
  Administered 2022-08-23: 1 mg via INTRAVENOUS
  Filled 2022-08-23: qty 1

## 2022-08-23 MED ORDER — ACETAMINOPHEN 325 MG PO TABS: 650.0000 mg | ORAL_TABLET | Freq: Four times a day (QID) | ORAL | Status: DC | PRN

## 2022-08-23 MED ORDER — INSULIN ASPART 100 UNIT/ML IJ SOLN
0.0000 [IU] | Freq: Three times a day (TID) | INTRAMUSCULAR | Status: DC
  Administered 2022-08-23: 8 [IU] via SUBCUTANEOUS
  Administered 2022-08-23: 2 [IU] via SUBCUTANEOUS
  Administered 2022-08-24: 3 [IU] via SUBCUTANEOUS
  Administered 2022-08-24 (×2): 5 [IU] via SUBCUTANEOUS
  Administered 2022-08-25 (×2): 3 [IU] via SUBCUTANEOUS
  Administered 2022-08-25: 2 [IU] via SUBCUTANEOUS
  Administered 2022-08-26 (×2): 3 [IU] via SUBCUTANEOUS

## 2022-08-23 MED ORDER — GABAPENTIN 100 MG PO CAPS: 400.0000 mg | ORAL_CAPSULE | ORAL | Status: DC

## 2022-08-23 MED ORDER — MORPHINE SULFATE (PF) 2 MG/ML IV SOLN
2.0000 mg | INTRAVENOUS | Status: AC | PRN
  Administered 2022-08-23 – 2022-08-24 (×3): 2 mg via INTRAVENOUS
  Filled 2022-08-23 (×3): qty 1

## 2022-08-23 MED ORDER — IPRATROPIUM-ALBUTEROL 0.5-2.5 (3) MG/3ML IN SOLN: 3.0000 mL | RESPIRATORY_TRACT | Status: DC | PRN

## 2022-08-23 MED ORDER — MAGNESIUM HYDROXIDE 400 MG/5ML PO SUSP: 30.0000 mL | Freq: Every day | ORAL | Status: DC | PRN

## 2022-08-23 MED ORDER — FUROSEMIDE 10 MG/ML IJ SOLN
40.0000 mg | Freq: Once | INTRAMUSCULAR | Status: AC
  Administered 2022-08-23: 40 mg via INTRAVENOUS
  Filled 2022-08-23: qty 4

## 2022-08-23 MED ORDER — HEPARIN (PORCINE) 25000 UT/250ML-% IV SOLN: 12.0000 [IU]/kg/h | INTRAVENOUS | Status: DC

## 2022-08-23 MED ORDER — COLESTIPOL HCL 1 G PO TABS
1.0000 g | ORAL_TABLET | Freq: Two times a day (BID) | ORAL | Status: DC
  Administered 2022-08-24 – 2022-08-26 (×5): 1 g via ORAL
  Filled 2022-08-23 (×6): qty 1

## 2022-08-23 MED ORDER — APIXABAN 5 MG PO TABS: 5.0000 mg | ORAL_TABLET | Freq: Two times a day (BID) | ORAL | Status: DC

## 2022-08-23 MED ORDER — SODIUM CHLORIDE 0.9% FLUSH
3.0000 mL | Freq: Two times a day (BID) | INTRAVENOUS | Status: DC
  Administered 2022-08-23 – 2022-08-24 (×4): 3 mL via INTRAVENOUS

## 2022-08-23 MED ORDER — FUROSEMIDE 20 MG PO TABS
20.0000 mg | ORAL_TABLET | Freq: Every day | ORAL | Status: DC
  Administered 2022-08-24: 20 mg via ORAL
  Filled 2022-08-23: qty 1

## 2022-08-23 MED ORDER — INSULIN ASPART 100 UNIT/ML IJ SOLN
0.0000 [IU] | Freq: Every day | INTRAMUSCULAR | Status: DC
  Administered 2022-08-24: 2 [IU] via SUBCUTANEOUS

## 2022-08-23 MED ORDER — LORAZEPAM 2 MG/ML IJ SOLN
0.5000 mg | INTRAMUSCULAR | Status: DC | PRN
  Administered 2022-08-23 – 2022-08-24 (×3): 0.5 mg via INTRAVENOUS
  Filled 2022-08-23 (×3): qty 1

## 2022-08-23 MED ORDER — FLUTICASONE FUROATE-VILANTEROL 100-25 MCG/ACT IN AEPB
1.0000 | INHALATION_SPRAY | Freq: Every day | RESPIRATORY_TRACT | Status: DC
  Administered 2022-08-24 – 2022-08-26 (×3): 1 via RESPIRATORY_TRACT
  Filled 2022-08-23: qty 28

## 2022-08-23 MED ORDER — AMLODIPINE BESYLATE 5 MG PO TABS
5.0000 mg | ORAL_TABLET | Freq: Once | ORAL | Status: AC
  Administered 2022-08-23: 5 mg via ORAL
  Filled 2022-08-23: qty 1

## 2022-08-23 MED ORDER — TRIMETHOBENZAMIDE HCL 100 MG/ML IM SOLN: 200.0000 mg | Freq: Four times a day (QID) | INTRAMUSCULAR | Status: DC | PRN

## 2022-08-23 MED ORDER — UMECLIDINIUM BROMIDE 62.5 MCG/ACT IN AEPB
1.0000 | INHALATION_SPRAY | Freq: Every day | RESPIRATORY_TRACT | Status: DC
  Administered 2022-08-25 – 2022-08-26 (×2): 1 via RESPIRATORY_TRACT
  Filled 2022-08-23 (×2): qty 7

## 2022-08-23 MED ORDER — ACETAMINOPHEN 650 MG RE SUPP: 650.0000 mg | Freq: Four times a day (QID) | RECTAL | Status: DC | PRN

## 2022-08-23 MED ORDER — GABAPENTIN 400 MG PO CAPS
800.0000 mg | ORAL_CAPSULE | Freq: Every day | ORAL | Status: DC
  Administered 2022-08-24 – 2022-08-25 (×3): 800 mg via ORAL
  Filled 2022-08-23 (×3): qty 2

## 2022-08-23 MED ORDER — LABETALOL HCL 5 MG/ML IV SOLN
10.0000 mg | INTRAVENOUS | Status: DC | PRN
  Administered 2022-08-23 – 2022-08-24 (×2): 10 mg via INTRAVENOUS
  Filled 2022-08-23 (×2): qty 4

## 2022-08-23 MED ORDER — LOSARTAN POTASSIUM 50 MG PO TABS
50.0000 mg | ORAL_TABLET | Freq: Every morning | ORAL | Status: DC
  Administered 2022-08-24: 50 mg via ORAL
  Filled 2022-08-23: qty 1

## 2022-08-23 MED ORDER — HEPARIN BOLUS VIA INFUSION
4000.0000 [IU] | Freq: Once | INTRAVENOUS | Status: AC
  Administered 2022-08-23: 4000 [IU] via INTRAVENOUS
  Filled 2022-08-23: qty 4000

## 2022-08-23 MED ORDER — HEPARIN BOLUS VIA INFUSION
5500.0000 [IU] | Freq: Once | INTRAVENOUS | Status: DC
  Filled 2022-08-23: qty 5500

## 2022-08-23 MED ORDER — ONDANSETRON HCL 4 MG PO TABS: 4.0000 mg | ORAL_TABLET | Freq: Four times a day (QID) | ORAL | Status: DC | PRN

## 2022-08-23 MED ORDER — HYDROCODONE-ACETAMINOPHEN 10-325 MG PO TABS
1.0000 | ORAL_TABLET | Freq: Three times a day (TID) | ORAL | Status: DC
  Administered 2022-08-24 – 2022-08-26 (×7): 1 via ORAL
  Filled 2022-08-23 (×8): qty 1

## 2022-08-23 MED ORDER — TAMSULOSIN HCL 0.4 MG PO CAPS
0.4000 mg | ORAL_CAPSULE | Freq: Two times a day (BID) | ORAL | Status: DC
  Administered 2022-08-24 – 2022-08-26 (×5): 0.4 mg via ORAL
  Filled 2022-08-23 (×5): qty 1

## 2022-08-23 MED ORDER — APIXABAN 5 MG PO TABS
10.0000 mg | ORAL_TABLET | Freq: Two times a day (BID) | ORAL | Status: DC
  Administered 2022-08-23 – 2022-08-26 (×6): 10 mg via ORAL
  Filled 2022-08-23 (×7): qty 2

## 2022-08-23 NOTE — ED Notes (Signed)
Notified MD of BP 193/107

## 2022-08-23 NOTE — Progress Notes (Signed)
  Echocardiogram 2D Echocardiogram has been performed.  James [name] 08/23/2022, 11:32 AM

## 2022-08-23 NOTE — Progress Notes (Signed)
ANTICOAGULATION CONSULT NOTE - Initial Consult  Pharmacy Consult for heparin Indication: pulmonary embolus  Allergies  Allergen Reactions   Invokana [Canagliflozin] Anaphylaxis    Facial/neck/ lip swelling   Ace Inhibitors Cough    Patient Measurements: Height: 6' (182.9 cm) Weight: 95.3 kg (210 lb) IBW/kg (Calculated) : 77.6 Heparin Dosing Weight: 95 Kg  Vital Signs: Temp: 98.8 F (37.1 C) (01/24 0405) Temp Source: Oral (01/24 0405) BP: 190/103 (01/24 0345) Pulse Rate: 111 (01/24 0345)  Labs: Recent Labs    08/23/22 0022 08/23/22 0029  HGB 13.6 13.9  HCT 40.8 41.0  PLT 274  --   CREATININE 1.11 1.00  TROPONINIHS 17  --     Estimated Creatinine Clearance: 72.9 mL/min (by C-G formula based on SCr of 1 mg/dL).   Medical History: Past Medical History:  Diagnosis Date   Anemia    Arthritis    BPH (benign prostatic hyperplasia)    CHF (congestive heart failure) (HCC)    Chronic pain    Diabetes mellitus    Dyspnea    GERD (gastroesophageal reflux disease)    Headache(784.0)    migraines, sinus headaches   Hepatitis    C and B-treated   HLD (hyperlipidemia)    Hypertension    Leukemia ([facility])    Lupus ([facility])    Multiple myeloma ([place])    Pneumonia 07/2011   Sleep apnea 03/2018   going for fitting on 04-29-18   Thyroid disease    goiter   Urinary frequency     Assessment: 76yoM presenting with shortness of breath found to have PE. No anticoagulation reported prior to admission. CBC WNL. Pharmacy to dose heparin.  Goal of Therapy:  Heparin level 0.3-0.7 units/ml Monitor platelets by anticoagulation protocol: Yes   Plan:  Give 4000 units bolus x 1 Start heparin infusion at 1500 units/hr Check anti-Xa level in 6 hours and daily while on heparin Continue to monitor H&H and platelets  Georga Bora, PharmD Clinical Pharmacist 08/23/2022 4:30 AM Please check AMION for all New Site numbers

## 2022-08-23 NOTE — Progress Notes (Signed)
Lower extremity venous bilateral study completed.   Please see CV Proc for preliminary results.   Aviella Disbrow, RDMS, RVT  

## 2022-08-23 NOTE — ED Notes (Signed)
ED TO INPATIENT HANDOFF REPORT  ED Nurse Name and Phone #: [phone]  S Name/Age/Gender James [name]. 77 y.o. male Room/Bed: 029C/029C  Code Status   Code Status: Full Code  Home/SNF/Other Home Patient oriented to: self, place, and time Is this baseline? Yes   Triage Complete: Triage complete  Chief Complaint Acute pulmonary embolism ([place]) [I26.99]  Triage Note Pt bib [facility] c/o SOB that started this evening. Hx COPD, SPO2 97% RA, lungs sounds diminished bilateral lowers, neb + solumedrol given PTA with no change. Pt 99% RA upon arrival. BP 228/115, reports missing meds today, unable to say which ones.    BP 228/115 HR 102 CBG 127   Allergies Allergies  Allergen Reactions   Invokana [Canagliflozin] Anaphylaxis    Facial/neck/ lip swelling   Ace Inhibitors Cough    Level of Care/Admitting Diagnosis ED Disposition     ED Disposition  Admit   Condition  --   Comment  Hospital Area: [place] [[id number]]  Level of Care: Progressive [102]  Admit to Progressive based on following criteria: Other see comments  Comments: Acute PE  May admit patient to [facility] or [facility] if equivalent level of care is available:: No  Covid Evaluation: Asymptomatic - no recent exposure (last 10 days) testing not required  Diagnosis: Acute pulmonary embolism [facility]) [[id number]]  Admitting Physician: Christel Mormon [[id number]]  Attending Physician: Christel Mormon [[id number]]  Certification:: I certify this patient will need inpatient services for at least 2 midnights  Estimated Length of Stay: 2          B Medical/Surgery History Past Medical History:  Diagnosis Date   Anemia    Arthritis    BPH (benign prostatic hyperplasia)    CHF (congestive heart failure) (HCC)    Chronic pain    Diabetes mellitus    Dyspnea    GERD (gastroesophageal reflux disease)    Headache(784.0)    migraines, sinus headaches   Hepatitis    C and B-treated   HLD (hyperlipidemia)     Hypertension    Leukemia ([place])    Lupus ([place])    Multiple myeloma ([place])    Pneumonia 07/2011   Sleep apnea 03/2018   going for fitting on 04-29-18   Thyroid disease    goiter   Urinary frequency    Past Surgical History:  Procedure Laterality Date   ABDOMINAL SURGERY     BACK SURGERY     x 5    BIOPSY  01/02/2020   Procedure: BIOPSY;  Surgeon: Carol Ada, MD;  Location: [facility];  Service: Endoscopy;;   CHOLECYSTECTOMY     COLONOSCOPY     COLONOSCOPY WITH PROPOFOL N/A 01/02/2020   Procedure: COLONOSCOPY WITH PROPOFOL;  Surgeon: Carol Ada, MD;  Location: [facility];  Service: Endoscopy;  Laterality: N/A;   cyst removal skull  20 years ago   ETHMOIDECTOMY  10/12/2011   Procedure: ETHMOIDECTOMY;  Surgeon: Thornell Sartorius, MD;  Location: [employer];  Service: ENT;  Laterality: Bilateral;  bilateral maxillary sinus osteal enlargement, frontal sinusotomy   EYE SURGERY     bilat cataract with lens implants   HAND SURGERY     right finger   HERNIA REPAIR     MICROLARYNGOSCOPY N/A 02/18/2021   Procedure: MICROLARYNGOSCOPY with Excisional Biopsy;  Surgeon: Jerrell Belfast, MD;  Location: [place];  Service: ENT;  Laterality: N/A;   POLYPECTOMY  01/02/2020   Procedure: POLYPECTOMY;  Surgeon: Carol Ada, MD;  [facility];  Service: Endoscopy;;   ROTATOR CUFF REPAIR     bilateral   SHOULDER ARTHROSCOPY WITH ROTATOR CUFF REPAIR Left 05/18/2014   Procedure: SHOULDER ARTHROSCOPY WITH ROTATOR CUFF REPAIR;  Surgeon: Nita Sells, MD;  Location: [place];  Service: Orthopedics;  Laterality: Left;  Left shoulder arthroscopy rotator cuff repair, subacromial decompression   sinus surgery     TONSILLECTOMY     TRIGGER FINGER RELEASE Right 05/02/2018   Procedure: RELEASE TRIGGER FINGER/A-1 PULLEY RIGHT SMALL FINGER;  Surgeon: Leanora Cover, MD;  Location: [place];  Service: Orthopedics;  Laterality: Right;     A IV  Location/Drains/Wounds Patient Lines/Drains/Airways Status     Active Line/Drains/Airways     Name Placement date Placement time Site Days   Peripheral IV 08/23/22 20 G Anterior;Left Forearm 08/23/22  --  Forearm  less than 1            Intake/Output Last 24 hours  Intake/Output Summary (Last 24 hours) at 08/23/2022 2151 Last data filed at 08/23/2022 1937 Gross per 24 hour  Intake --  Output 500 ml  Net -500 ml    Labs/Imaging Results for orders placed or performed during the hospital encounter of 08/23/22 (from the past 48 hour(s))  CBC with Differential     Status: Abnormal   Collection Time: 08/23/22 12:22 AM  Result Value Ref Range   WBC 7.4 4.0 - 10.5 K/uL   RBC 4.34 4.22 - 5.81 MIL/uL   Hemoglobin 13.6 13.0 - 17.0 g/dL   HCT 40.8 39.0 - 52.0 %   MCV 94.0 80.0 - 100.0 fL   MCH 31.3 26.0 - 34.0 pg   MCHC 33.3 30.0 - 36.0 g/dL   RDW 16.0 (H) 11.5 - 15.5 %   Platelets 274 150 - 400 K/uL   nRBC 0.0 0.0 - 0.2 %   Neutrophils Relative % 66 %   Neutro Abs 4.9 1.7 - 7.7 K/uL   Lymphocytes Relative 25 %   Lymphs Abs 1.8 0.7 - 4.0 K/uL   Monocytes Relative 7 %   Monocytes Absolute 0.5 0.1 - 1.0 K/uL   Eosinophils Relative 1 %   Eosinophils Absolute 0.1 0.0 - 0.5 K/uL   Basophils Relative 1 %   Basophils Absolute 0.1 0.0 - 0.1 K/uL   Immature Granulocytes 0 %   Abs Immature Granulocytes 0.02 0.00 - 0.07 K/uL    Comment: Performed at [facility], [address] [place], [place] [address]  Comprehensive metabolic panel     Status: Abnormal   Collection Time: 08/23/22 12:22 AM  Result Value Ref Range   Sodium 136 135 - 145 mmol/L   Potassium 3.4 (L) 3.5 - 5.1 mmol/L   Chloride 104 98 - 111 mmol/L   CO2 20 (L) 22 - 32 mmol/L   Glucose, Bld 134 (H) 70 - 99 mg/dL    Comment: Glucose reference range applies only to samples taken after fasting for at least 8 hours.   BUN 6 (L) 8 - 23 mg/dL   Creatinine, Ser 1.11 0.61 - 1.24 mg/dL   Calcium 9.2 8.9 - 10.3 mg/dL    Total Protein 6.7 6.5 - 8.1 g/dL   Albumin 3.9 3.5 - 5.0 g/dL   AST 27 15 - 41 U/L   ALT 18 0 - 44 U/L   Alkaline Phosphatase 88 38 - 126 U/L   Total Bilirubin 1.1 0.3 - 1.2 mg/dL   GFR, Estimated >60 >60  mL/min    Comment: (NOTE) Calculated using the CKD-EPI Creatinine Equation (2021)    Anion gap 12 5 - 15    Comment: Performed at [facility], [place] [address], [place], [place] [address]  Brain natriuretic peptide     Status: Abnormal   Collection Time: 08/23/22 12:22 AM  Result Value Ref Range   B Natriuretic Peptide 103.0 (H) 0.0 - 100.0 pg/mL    Comment: Performed at [facility] [address] [place], [place] [address]  Troponin I (High Sensitivity)     Status: None   Collection Time: 08/23/22 12:22 AM  Result Value Ref Range   Troponin I (High Sensitivity) 17 <18 ng/L    Comment: (NOTE) Elevated high sensitivity troponin I (hsTnI) values and significant  changes across serial measurements may suggest ACS but many other  chronic and acute conditions are known to elevate hsTnI results.  Refer to the "Links" section for chest pain algorithms and additional  guidance. Performed at [facility], [place] [address], [place], [place] [address]   Resp panel by RT-PCR (RSV, Flu A&B, Covid) Anterior Nasal Swab     Status: None   Collection Time: 08/23/22 12:22 AM   Specimen: Anterior Nasal Swab  Result Value Ref Range   SARS Coronavirus 2 by RT PCR NEGATIVE NEGATIVE    Comment: (NOTE) SARS-CoV-2 target nucleic acids are NOT DETECTED.  The SARS-CoV-2 RNA is generally detectable in upper respiratory specimens during the acute phase of infection. The lowest concentration of SARS-CoV-2 viral copies this assay can detect is 138 copies/mL. A negative result does not preclude SARS-Cov-2 infection and should not be used as the sole basis for treatment or other patient management decisions. A negative result may occur with  improper specimen collection/handling,  submission of specimen other than nasopharyngeal swab, presence of viral mutation(s) within the areas targeted by this assay, and inadequate number of viral copies(<138 copies/mL). A negative result must be combined with clinical observations, patient history, and epidemiological information. The expected result is Negative.  Fact Sheet for Patients:  EntrepreneurPulse.com.au  Fact Sheet for Healthcare Providers:  IncredibleEmployment.be  This test is no t yet approved or cleared by the [place] FDA and  has been authorized for detection and/or diagnosis of SARS-CoV-2 by FDA under an Emergency Use Authorization (EUA). This EUA will remain  in effect (meaning this test can be used) for the duration of the COVID-19 declaration under Section 564(b)(1) of the Act, 21 U.S.C.section 360bbb-3(b)(1), unless the authorization is terminated  or revoked sooner.       Influenza A by PCR NEGATIVE NEGATIVE   Influenza B by PCR NEGATIVE NEGATIVE    Comment: (NOTE) The Xpert Xpress SARS-CoV-2/FLU/RSV plus assay is intended as an aid in the diagnosis of influenza from Nasopharyngeal swab specimens and should not be used as a sole basis for treatment. Nasal washings and aspirates are unacceptable for Xpert Xpress SARS-CoV-2/FLU/RSV testing.  Fact Sheet for Patients: EntrepreneurPulse.com.au  Fact Sheet for Healthcare Providers: IncredibleEmployment.be  This test is not yet approved or cleared by the [place] FDA and has been authorized for detection and/or diagnosis of SARS-CoV-2 by FDA under an Emergency Use Authorization (EUA). This EUA will remain in effect (meaning this test can be used) for the duration of the COVID-19 declaration under Section 564(b)(1) of the Act, 21 U.S.C. section 360bbb-3(b)(1), unless the authorization is terminated or revoked.     Resp Syncytial Virus by PCR NEGATIVE NEGATIVE  Comment: (NOTE) Fact Sheet for Patients: EntrepreneurPulse.com.au  Fact Sheet for Healthcare Providers: IncredibleEmployment.be  This test is not yet approved or cleared by the [place] FDA and has been authorized for detection and/or diagnosis of SARS-CoV-2 by FDA under an Emergency Use Authorization (EUA). This EUA will remain in effect (meaning this test can be used) for the duration of the COVID-19 declaration under Section 564(b)(1) of the Act, 21 U.S.C. section 360bbb-3(b)(1), unless the authorization is terminated or revoked.  Performed at [facility], [place] [address] [place], [place] [address]   I-stat chem 8, ED (not at [facility], [facility] or [facility])     Status: Abnormal   Collection Time: 08/23/22 12:29 AM  Result Value Ref Range   Sodium 139 135 - 145 mmol/L   Potassium 3.5 3.5 - 5.1 mmol/L   Chloride 103 98 - 111 mmol/L   BUN 6 (L) 8 - 23 mg/dL   Creatinine, Ser 1.00 0.61 - 1.24 mg/dL   Glucose, Bld 132 (H) 70 - 99 mg/dL    Comment: Glucose reference range applies only to samples taken after fasting for at least 8 hours.   Calcium, Ion 1.15 1.15 - 1.40 mmol/L   TCO2 23 22 - 32 mmol/L   Hemoglobin 13.9 13.0 - 17.0 g/dL   HCT 41.0 39.0 - 52.0 %  Troponin I (High Sensitivity)     Status: Abnormal   Collection Time: 08/23/22  5:18 AM  Result Value Ref Range   Troponin I (High Sensitivity) 22 (H) <18 ng/L    Comment: (NOTE) Elevated high sensitivity troponin I (hsTnI) values and significant  changes across serial measurements may suggest ACS but many other  chronic and acute conditions are known to elevate hsTnI results.  Refer to the "Links" section for chest pain algorithms and additional  guidance. Performed at [facility], [place] [address] [place], [place] [address]   Hemoglobin A1c     Status: Abnormal   Collection Time: 08/23/22  5:18 AM  Result Value Ref Range   Hgb A1c MFr Bld 6.0 (H) 4.8 - 5.6 %    Comment:  (NOTE) Pre diabetes:          5.7%-6.4%  Diabetes:              >6.4%  Glycemic control for   <7.0% adults with diabetes    Mean Plasma Glucose 125.5 mg/dL    Comment: Performed at [facility] [address], [place], [place] [address]  Heparin level (unfractionated)     Status: None   Collection Time: 08/23/22 12:09 PM  Result Value Ref Range   Heparin Unfractionated 0.50 0.30 - 0.70 IU/mL    Comment: (NOTE) The clinical reportable range upper limit is being lowered to >1.10 to align with the FDA approved guidance for the current laboratory assay.  If heparin results are below expected values, and patient dosage has  been confirmed, suggest follow up testing of antithrombin III levels. Performed at [facility], [place] [address], [place], [place] [address]   Rapid urine drug screen (hospital performed)     Status: Abnormal   Collection Time: 08/23/22 12:18 PM  Result Value Ref Range   Opiates POSITIVE (A) NONE DETECTED   Cocaine NONE DETECTED NONE DETECTED   Benzodiazepines NONE DETECTED NONE DETECTED   Amphetamines NONE DETECTED NONE DETECTED   Tetrahydrocannabinol NONE DETECTED NONE DETECTED   Barbiturates NONE DETECTED NONE DETECTED    Comment: (NOTE) DRUG  SCREEN FOR MEDICAL PURPOSES ONLY.  IF CONFIRMATION IS NEEDED FOR ANY PURPOSE, NOTIFY LAB WITHIN 5 DAYS.  LOWEST DETECTABLE LIMITS FOR URINE DRUG SCREEN Drug Class                     Cutoff (ng/mL) Amphetamine and metabolites    1000 Barbiturate and metabolites    200 Benzodiazepine                 200 Opiates and metabolites        300 Cocaine and metabolites        300 THC                            50 Performed at [facility], [place] [address] [place], [place] [address]   Urinalysis, Routine w reflex microscopic -     Status: Abnormal   Collection Time: 08/23/22 12:18 PM  Result Value Ref Range   Color, Urine STRAW (A) YELLOW   APPearance CLEAR CLEAR   Specific Gravity, Urine 1.020  1.005 - 1.030   pH 6.0 5.0 - 8.0   Glucose, UA >=500 (A) NEGATIVE mg/dL   Hgb urine dipstick MODERATE (A) NEGATIVE   Bilirubin Urine NEGATIVE NEGATIVE   Ketones, ur 5 (A) NEGATIVE mg/dL   Protein, ur 100 (A) NEGATIVE mg/dL   Nitrite NEGATIVE NEGATIVE   Leukocytes,Ua NEGATIVE NEGATIVE   RBC / HPF 0-5 0 - 5 RBC/hpf   WBC, UA 0-5 0 - 5 WBC/hpf   Bacteria, UA NONE SEEN NONE SEEN   Squamous Epithelial / HPF 0-5 0 - 5 /HPF    Comment: Performed at [facility], [place] [address], [place], [place] [address]  CBG monitoring, ED     Status: Abnormal   Collection Time: 08/23/22  2:43 PM  Result Value Ref Range   Glucose-Capillary 268 (H) 70 - 99 mg/dL    Comment: Glucose reference range applies only to samples taken after fasting for at least 8 hours.  CBG monitoring, ED     Status: Abnormal   Collection Time: 08/23/22  5:34 PM  Result Value Ref Range   Glucose-Capillary 130 (H) 70 - 99 mg/dL    Comment: Glucose reference range applies only to samples taken after fasting for at least 8 hours.  Heparin level (unfractionated)     Status: None   Collection Time: 08/23/22  9:10 PM  Result Value Ref Range   Heparin Unfractionated 0.56 0.30 - 0.70 IU/mL    Comment: (NOTE) The clinical reportable range upper limit is being lowered to >1.10 to align with the FDA approved guidance for the current laboratory assay.  If heparin results are below expected values, and patient dosage has  been confirmed, suggest follow up testing of antithrombin III levels. Performed at [facility], [place] [address], [place], [place] [address]    VAS [place] LOWER EXTREMITY VENOUS (DVT)  Result Date: 08/23/2022  Lower Venous DVT Study Patient Name:  Dacen Frayre.  Date of Exam:   08/23/2022 Medical Rec #: [id number]              Accession #:    [id number] Date of Birth: [birth year]              Patient Gender: M Patient Age:   50 years Exam [facility] Procedure:      VAS [place] LOWER EXTREMITY  VENOUS (DVT) Referring Phys: Fuller Plan --------------------------------------------------------------------------------  Indications: Pulmonary embolism.  Limitations: Constant patient movement and poor ultrasound/tissue interface. Comparison Study: 10-26-2021 Prior left lower extremity venous study was                   negative for DVT. Performing Technologist: Darlin Coco RDMS, RVT  Examination Guidelines: A complete evaluation includes B-mode imaging, spectral Doppler, color Doppler, and power Doppler as needed of all accessible portions of each vessel. Bilateral testing is considered an integral part of a complete examination. Limited examinations for reoccurring indications may be performed as noted. The reflux portion of the exam is performed with the patient in reverse Trendelenburg.  +---------+---------------+---------+-----------+----------+-------------------+ RIGHT    CompressibilityPhasicitySpontaneityPropertiesThrombus Aging      +---------+---------------+---------+-----------+----------+-------------------+ CFV      Full           Yes      Yes                                      +---------+---------------+---------+-----------+----------+-------------------+ SFJ      Full                                                             +---------+---------------+---------+-----------+----------+-------------------+ FV Prox  Full                                                             +---------+---------------+---------+-----------+----------+-------------------+ FV Mid   Full                                                             +---------+---------------+---------+-----------+----------+-------------------+ FV DistalFull                                                             +---------+---------------+---------+-----------+----------+-------------------+ PFV      Full                                                              +---------+---------------+---------+-----------+----------+-------------------+ POP      Full           Yes      Yes                                      +---------+---------------+---------+-----------+----------+-------------------+ PTV      Full                                                             +---------+---------------+---------+-----------+----------+-------------------+  PERO                                                  Not well visualized +---------+---------------+---------+-----------+----------+-------------------+   +---------+---------------+---------+-----------+----------+-------------------+ LEFT     CompressibilityPhasicitySpontaneityPropertiesThrombus Aging      +---------+---------------+---------+-----------+----------+-------------------+ CFV      Full           Yes      Yes                                      +---------+---------------+---------+-----------+----------+-------------------+ SFJ      Full                                                             +---------+---------------+---------+-----------+----------+-------------------+ FV Prox  Full                                                             +---------+---------------+---------+-----------+----------+-------------------+ FV Mid   Full                                                             +---------+---------------+---------+-----------+----------+-------------------+ FV DistalFull                                                             +---------+---------------+---------+-----------+----------+-------------------+ PFV      Full                                                             +---------+---------------+---------+-----------+----------+-------------------+ POP      Full           Yes      Yes                                      +---------+---------------+---------+-----------+----------+-------------------+  PTV      Full                                                             +---------+---------------+---------+-----------+----------+-------------------+ PERO  Not well visualized +---------+---------------+---------+-----------+----------+-------------------+     Summary: RIGHT: - There is no evidence of deep vein thrombosis in the lower extremity.  - No cystic structure found in the popliteal fossa.  LEFT: - There is no evidence of deep vein thrombosis in the lower extremity.  - No cystic structure found in the popliteal fossa.  *See table(s) above for measurements and observations. Electronically signed by Harold Barban MD on 08/23/2022 at 8:56:30 PM.    Final    ECHOCARDIOGRAM COMPLETE  Result Date: 08/23/2022    ECHOCARDIOGRAM REPORT   Patient Name:   Lyrick Lagrand. Date of Exam: 08/23/2022 Medical Rec #:  [id number]             Height:       72.0 in Accession #:    [id number]            Weight:       210.0 lb Date of Birth:  May 16, 1946             BSA:          2.175 m Patient Age:    67 years              BP:           188/106 mmHg Patient Gender: M                     HR:           99 bpm. Exam Location:  Inpatient Procedure: 2D Echo, Cardiac Doppler, Color Doppler and Intracardiac            Opacification Agent Indications:    Pulmonary embolism  History:        Patient has prior history of Echocardiogram examinations, most                 recent 11/15/2021. CAD, Arrythmias:RBBB; Risk                 Factors:Hypertension and Former Smoker.  Sonographer:    Cl[place]ton Lefort RDCS (AE) Referring Phys: [id number] [facility]  Sonographer Comments: Technically difficult study due to poor echo windows, suboptimal parasternal window, suboptimal apical window and no subcostal window. Image acquisition challenging due to uncooperative patient and Image acquisition challenging due to respiratory motion. IMPRESSIONS  1. Left ventricular ejection  fraction, by estimation, is 60 to 65%. The left ventricle has normal function. The left ventricle has no regional wall motion abnormalities. Left ventricular diastolic parameters are indeterminate.  2. Right ventricular systolic function is normal. The right ventricular size is normal.  3. The mitral valve is abnormal. No evidence of mitral valve regurgitation. No evidence of mitral stenosis.  4. The aortic valve is normal in structure. There is mild calcification of the aortic valve. There is mild thickening of the aortic valve. Aortic valve regurgitation is not visualized. Aortic valve sclerosis is present, with no evidence of aortic valve stenosis.  5. The inferior vena cava is normal in size with greater than 50% respiratory variability, suggesting right atrial pressure of 3 mmHg. FINDINGS  Left Ventricle: Left ventricular ejection fraction, by estimation, is 60 to 65%. The left ventricle has normal function. The left ventricle has no regional wall motion abnormalities. Definity contrast agent was given IV to delineate the left ventricular  endocardial borders. The left ventricular internal cavity size was normal in size. There is no left ventricular hypertrophy. Left ventricular diastolic parameters are indeterminate. Right Ventricle:  The right ventricular size is normal. No increase in right ventricular wall thickness. Right ventricular systolic function is normal. Left Atrium: Left atrial size was normal in size. Right Atrium: Right atrial size was normal in size. Pericardium: There is no evidence of pericardial effusion. Mitral Valve: The mitral valve is abnormal. There is mild thickening of the mitral valve leaflet(s). There is mild calcification of the mitral valve leaflet(s). No evidence of mitral valve regurgitation. No evidence of mitral valve stenosis. Tricuspid Valve: The tricuspid valve is normal in structure. Tricuspid valve regurgitation is not demonstrated. No evidence of tricuspid stenosis.  Aortic Valve: The aortic valve is normal in structure. There is mild calcification of the aortic valve. There is mild thickening of the aortic valve. Aortic valve regurgitation is not visualized. Aortic valve sclerosis is present, with no evidence of aortic valve stenosis. Aortic valve mean gradient measures 3.0 mmHg. Aortic valve peak gradient measures 6.8 mmHg. Aortic valve area, by VTI measures 2.97 cm. Pulmonic Valve: The pulmonic valve was normal in structure. Pulmonic valve regurgitation is not visualized. No evidence of pulmonic stenosis. Aorta: The aortic root is normal in size and structure. Venous: The inferior vena cava is normal in size with greater than 50% respiratory variability, suggesting right atrial pressure of 3 mmHg. IAS/Shunts: The interatrial septum was not well visualized.  LEFT VENTRICLE PLAX 2D LVIDd:         5.10 cm LVIDs:         3.70 cm LV PW:         1.10 cm LV IVS:        1.00 cm LVOT diam:     2.30 cm LV SV:         59 LV SV Index:   27 LVOT Area:     4.15 cm  RIGHT VENTRICLE RV S prime:     12.60 cm/s LEFT [facility]             Index LA diam:        3.90 cm 1.79 cm/m LA Vol (A2C):   34.0 ml 15.63 ml/m LA Vol (A4C):   43.0 ml 19.77 ml/m LA Biplane Vol: 38.9 ml 17.88 ml/m  AORTIC VALVE AV Area (Vmax):    2.90 cm AV Area (Vmean):   2.84 cm AV Area (VTI):     2.97 cm AV Vmax:           130.00 cm/s AV Vmean:          84.200 cm/s AV VTI:            0.197 m AV Peak Grad:      6.8 mmHg AV Mean Grad:      3.0 mmHg LVOT Vmax:         90.70 cm/s LVOT Vmean:        57.600 cm/s LVOT VTI:          0.141 m LVOT/AV VTI ratio: 0.72  AORTA Ao Root diam: 3.20 cm  SHUNTS Systemic VTI:  0.14 m Systemic Diam: 2.30 cm Jenkins Rouge MD Electronically signed by Jenkins Rouge MD Signature Date/Time: 08/23/2022/12:01:16 PM    Final    CT Angio Chest PE W and/or Wo Contrast  Result Date: 08/23/2022 CLINICAL DATA:  Syncope/presyncope, with hypertension and COPD. EXAM: CT ANGIOGRAPHY CHEST WITH CONTRAST  TECHNIQUE: Multidetector CT imaging of the chest was performed using the standard protocol during bolus administration of intravenous contrast. Multiplanar CT image reconstructions and MIPs were obtained to evaluate the  vascular anatomy. RADIATION DOSE REDUCTION: This exam was performed according to the departmental dose-optimization program which includes automated exposure control, adjustment of the mA and/or kV according to patient size and/or use of iterative reconstruction technique. CONTRAST:  81m OMNIPAQUE IOHEXOL 350 MG/ML SOLN COMPARISON:  Chest CT no contrast 08/19/2022, CTA chest 08/12/2022, chest CT no contrast 11/04/2021. Is okay extraocular today FINDINGS: Cardiovascular: There is an acute hypodense thrombus partially filling a right upper lobe anterior segmental artery on 5:51 and 52 and extending into vertically and anteriorly directed subsegmental divisions of the surgery. There is no arterial dilatation or further visible embolus. This is a small clot burden without visible right heart strain findings or IVC reflux. Stable cardiomegaly. A small pericardial effusion again noted. There is three-vessel coronary artery calcific plaque, with aortic atherosclerosis and scattered calcifications in the great vessels. No aortic aneurysm, dissection or stenosis is seen. The descending segment is tortuous and also normal in caliber. The pulmonary veins are decompressed. Mediastinum/Nodes: A 2.3 cm hypodense nodule in the left lobe of the thyroid gland is again shown. Thyroid ultrasound recommended. The lower poles of the thyroid gland are prominent and otherwise unremarkable. Axillary spaces are clear. There is no intrathoracic adenopathy. Mild asymmetric elevation again noted right hemidiaphragm. The thoracic esophagus, thoracic trachea, and main bronchi are unremarkable. Lungs/Pleura: Subpleural fibrosis with a basal gradient is again shown, with unchanged lower zonal ground-glass opacities and  bronchiolectasis, scattered honeycombing in the bases. An oval noncalcified subfissural right middle lobe nodule again measures 10 x 0.6 mm, stable. No focal pneumonia is evident and no further nodules. There is no pleural effusion, thickening or pneumothorax. Upper Abdomen: No acute abnormality. Multiple hepatic cysts. Old cholecystectomy. Musculoskeletal: There is osteopenia with multilevel thoracic compression fractures, somewhat mottled appearance of the bones consistent with patient's history of multiple myeloma, with profound osteopenia. There are healed fracture deformities of multiple ribs. Review of the MIP images confirms the above findings. IMPRESSION: 1. Acute right upper lobe anterior segmental and subsegmental emboli with small clot burden. No visible right heart strain findings or IVC reflux. No other appreciable emboli. 2. Cardiomegaly with small pericardial effusion, calcific CAD, and aortic atherosclerosis. 3. Stable 10 x 0.6 cm right middle lobe nodule. 4. Stable interstitial lung disease with basilar gradient and basilar ground-glass opacities and bronchiolectasis. No focal pneumonia or pleural effusion. 5. Osteopenia with multilevel thoracic compression fractures and mottled appearance of the bones consistent with multiple myeloma. 6. 2.3 cm hypodense nodule in the left lobe of the thyroid gland. Ultrasound recommended. 7. Multiple hepatic cysts. 8. Old healed rib fractures. Aortic Atherosclerosis (ICD10-I70.0). Electronically Signed   By: KTelford NabM.D.   On: 08/23/2022 02:35   DG Chest Portable 1 View  Result Date: 08/23/2022 CLINICAL DATA:  Shortness of breath. EXAM: PORTABLE CHEST 1 VIEW COMPARISON:  August 29, 2021 FINDINGS: The cardiac silhouette is mildly enlarged and unchanged in size. There is moderate severity calcification of the aortic arch. Mild to moderate severity increased interstitial lung markings are seen. There is no evidence of focal consolidation, pleural effusion  or pneumothorax. Multilevel degenerative changes are seen throughout the thoracic spine. IMPRESSION: Mild to moderate severity increased interstitial lung markings, likely chronic in nature. A superimposed component of interstitial edema cannot be excluded. Electronically Signed   By: TVirgina NorfolkM.D.   On: 08/23/2022 00:41    Pending Labs Unresulted Labs (From admission, onward)     Start     Ordered   08/24/22 0500  Basic metabolic panel  Tomorrow morning,   R        08/23/22 0624   08/24/22 0500  CBC  Daily,   R        08/23/22 2146            Vitals/Pain Today's Vitals   08/23/22 1545 08/23/22 1621 08/23/22 1935 08/23/22 1936  BP: (!) 144/107 (!) 168/107 (!) 177/112   Pulse:  93 93   Resp:  (!) 28 (!) 32   Temp:  97.9 F (36.6 C)  98.3 F (36.8 C)  TempSrc:    Oral  SpO2:  99% 99%   Weight:      Height:      PainSc:        Isolation Precautions No active isolations  Medications Medications  LORazepam (ATIVAN) injection 0.5 mg (0.5 mg Intravenous Given 08/23/22 1246)  acetaminophen (TYLENOL) tablet 650 mg (has no administration in time range)    Or  acetaminophen (TYLENOL) suppository 650 mg (has no administration in time range)  traZODone (DESYREL) tablet 25 mg (has no administration in time range)  magnesium hydroxide (MILK OF MAGNESIA) suspension 30 mL (has no administration in time range)  HYDROcodone-acetaminophen (NORCO) 10-325 MG per tablet 1 tablet (has no administration in time range)  sodium chloride flush (NS) 0.9 % injection 3 mL (3 mLs Intravenous Given 08/23/22 0930)  ipratropium-albuterol (DUONEB) 0.5-2.5 (3) MG/3ML nebulizer solution 3 mL (has no administration in time range)  morphine (PF) 2 MG/ML injection 2 mg (2 mg Intravenous Given 08/23/22 1452)  trimethobenzamide (TIGAN) injection 200 mg (has no administration in time range)  insulin aspart (novoLOG) injection 0-15 Units (2 Units Subcutaneous Given 08/23/22 1759)  insulin aspart (novoLOG)  injection 0-5 Units (has no administration in time range)  labetalol (NORMODYNE) injection 10 mg (10 mg Intravenous Given 08/23/22 1249)  apixaban (ELIQUIS) tablet 10 mg (has no administration in time range)    Followed by  apixaban (ELIQUIS) tablet 5 mg (has no administration in time range)  colestipol (COLESTID) tablet 1 g (has no administration in time range)  losartan (COZAAR) tablet 50 mg (has no administration in time range)  furosemide (LASIX) tablet 20 mg (has no administration in time range)  amLODipine (NORVASC) tablet 10 mg (has no administration in time range)  tamsulosin (FLOMAX) capsule 0.4 mg (has no administration in time range)  gabapentin (NEURONTIN) capsule 400-800 mg (has no administration in time range)  fluticasone furoate-vilanterol (BREO ELLIPTA) 100-25 MCG/ACT 1 puff (has no administration in time range)    And  umeclidinium bromide (INCRUSE ELLIPTA) 62.5 MCG/ACT 1 puff (has no administration in time range)  furosemide (LASIX) injection 40 mg (40 mg Intravenous Given 08/23/22 0125)  haloperidol lactate (HALDOL) injection 1 mg (1 mg Intravenous Given 08/23/22 0148)  iohexol (OMNIPAQUE) 350 MG/ML injection 75 mL (75 mLs Intravenous Contrast Given 08/23/22 0207)  heparin bolus via infusion 4,000 Units (4,000 Units Intravenous Bolus from Bag 08/23/22 0510)  amLODipine (NORVASC) tablet 5 mg (5 mg Oral Given 08/23/22 0511)    Mobility walks with device     Focused Assessments Pulmonary Assessment Handoff:  Lung sounds:   O2 Device: Room Air      R Recommendations: See Admitting Provider Note  Report given to:   Additional Notes:

## 2022-08-23 NOTE — ED Notes (Signed)
Patient continues to keep getting up by his self pulls all wires off and has pulled out his IV. He was sitting on the stool in room when I walked in.  Educated patient with his condition its not safe to do this. He is struggling to breathe and also educated wife that its not safe to get up by his self. Opened door to better monitor patient

## 2022-08-23 NOTE — Progress Notes (Signed)
ANTICOAGULATION CONSULT NOTE - Follow-Up Consult  Pharmacy Consult for heparin Indication: pulmonary embolus  Allergies  Allergen Reactions   Invokana [Canagliflozin] Anaphylaxis    Facial/neck/ lip swelling   Ace Inhibitors Cough    Patient Measurements: Height: 6' (182.9 cm) Weight: 95.3 kg (210 lb) IBW/kg (Calculated) : 77.6 Heparin Dosing Weight: 95 Kg  Vital Signs: Temp: 98 F (36.7 C) (01/24 1208) Temp Source: Oral (01/24 0405) BP: 173/123 (01/24 1208) Pulse Rate: 87 (01/24 1208)  Labs: Recent Labs    08/23/22 0022 08/23/22 0029 08/23/22 0518 08/23/22 1209  HGB 13.6 13.9  --   --   HCT 40.8 41.0  --   --   PLT 274  --   --   --   HEPARINUNFRC  --   --   --  0.50  CREATININE 1.11 1.00  --   --   TROPONINIHS 17  --  22*  --      Estimated Creatinine Clearance: 72.9 mL/min (by C-G formula based on SCr of 1 mg/dL).   Medical History: Past Medical History:  Diagnosis Date   Anemia    Arthritis    BPH (benign prostatic hyperplasia)    CHF (congestive heart failure) (HCC)    Chronic pain    Diabetes mellitus    Dyspnea    GERD (gastroesophageal reflux disease)    Headache(784.0)    migraines, sinus headaches   Hepatitis    C and B-treated   HLD (hyperlipidemia)    Hypertension    Leukemia (HCC)    Lupus (HCC)    Multiple myeloma ([place])    Pneumonia 07/2011   Sleep apnea 03/2018   going for fitting on 04-29-18   Thyroid disease    goiter   Urinary frequency     Assessment: 77 yo M presenting with shortness of breath found to have PE. No anticoagulation reported prior to admission. Pharmacy to dose heparin.  Heparin level 0.5, therapeutic Current heparin infusion rate: 1500 units/hr  Hgb 13.9, Plt 274  No s/sx of bleeding per RN report  Goal of Therapy:  Heparin level 0.3-0.7 units/ml Monitor platelets by anticoagulation protocol: Yes   Plan:  Continue heparin infusion at 1500 units/hr Check heparin level in 6 hours Monitor daily CBC,  heparin level, and for s/sx of bleeding  Luisa Hart, PharmD, BCPS Clinical Pharmacist 08/23/2022 1:49 PM   Please refer to [facility] for pharmacy phone number

## 2022-08-23 NOTE — ED Notes (Signed)
Patient is constantly pulling on cords and taking them off. Will connect cords back as needed and will continue to monitor.

## 2022-08-23 NOTE — ED Notes (Signed)
Patient had diarrhea and urinated in the floor of his room while trying to ambulate to the bathroom. This paramedic cleaned patient, patient bed, and the floor. Patient was placed in a brief at this time. Patient was able to stand without difficulty. Patient was SOB while standing to be cleaned. Patient was placed on 2 lpm nasal cannula just for comfort. Patient shoes full of loose stool. Patient aware and told this paramedic to throw them out. Patient placed in blue paper scrub pants and yellow socks.
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
## 2022-08-23 NOTE — ED Triage Notes (Signed)
Pt bib [facility] c/o SOB that started this evening. Hx COPD, SPO2 97% RA, lungs sounds diminished bilateral lowers, neb + solumedrol given PTA with no change. Pt 99% RA upon arrival. BP 228/115, reports missing meds today, unable to say which ones.    BP 228/115 HR 102 CBG 127

## 2022-08-23 NOTE — ED Notes (Signed)
Patient very restless still pulling wires off.

## 2022-08-23 NOTE — ED Notes (Signed)
Notified MD of patient being restless and continuing to be very impulsive getting up out of bed pulling things off.

## 2022-08-23 NOTE — ED Notes (Signed)
Patient resting at this time. Chest rise and fall noted.

## 2022-08-23 NOTE — Discharge Instructions (Signed)
Information on my medicine - ELIQUIS (apixaban)  This medication education was reviewed with me or my healthcare representative as part of my discharge preparation.  Why was Eliquis prescribed for you? Eliquis was prescribed to treat blood clots that may have been found in the veins of your legs (deep vein thrombosis) or in your lungs (pulmonary embolism) and to reduce the risk of them occurring again.  What do You need to know about Eliquis ? The starting dose is 10 mg (two 5 mg tablets) taken TWICE daily for the FIRST SEVEN (7) DAYS, then on 08/30/2022  the dose is reduced to ONE 5 mg tablet taken TWICE daily.  Eliquis may be taken with or without food.   Try to take the dose about the same time in the morning and in the evening. If you have difficulty swallowing the tablet whole please discuss with your pharmacist how to take the medication safely.  Take Eliquis exactly as prescribed and DO NOT stop taking Eliquis without talking to the doctor who prescribed the medication.  Stopping may increase your risk of developing a new blood clot.  Refill your prescription before you run out.  After discharge, you should have regular check-up appointments with your healthcare provider that is prescribing your Eliquis.    What do you do if you miss a dose? If a dose of ELIQUIS is not taken at the scheduled time, take it as soon as possible on the same day and twice-daily administration should be resumed. The dose should not be doubled to make up for a missed dose.  Important Safety Information A possible side effect of Eliquis is bleeding. You should call your healthcare provider right away if you experience any of the following: Bleeding from an injury or your nose that does not stop. Unusual colored urine (red or dark brown) or unusual colored stools (red or black). Unusual bruising for unknown reasons. A serious fall or if you hit your head (even if there is no bleeding).  Some  medicines may interact with Eliquis and might increase your risk of bleeding or clotting while on Eliquis. To help avoid this, consult your healthcare provider or pharmacist prior to using any new prescription or non-prescription medications, including herbals, vitamins, non-steroidal anti-inflammatory drugs (NSAIDs) and supplements.  This website has more information on Eliquis (apixaban): http://[email or web address].com/eliquis/home
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
## 2022-08-23 NOTE — ED Notes (Signed)
Pt had a complete bed change and an episode of diarrhea

## 2022-08-23 NOTE — ED Notes (Signed)
Patient transported to CT 

## 2022-08-24 ENCOUNTER — Inpatient Hospital Stay (HOSPITAL_COMMUNITY): Payer: Medicare HMO

## 2022-08-24 ENCOUNTER — Other Ambulatory Visit (HOSPITAL_COMMUNITY): Payer: Self-pay

## 2022-08-24 DIAGNOSIS — I2699 Other pulmonary embolism without acute cor pulmonale: Secondary | ICD-10-CM | POA: Diagnosis not present

## 2022-08-24 DIAGNOSIS — I160 Hypertensive urgency: Secondary | ICD-10-CM | POA: Diagnosis not present

## 2022-08-24 DIAGNOSIS — Z794 Long term (current) use of insulin: Secondary | ICD-10-CM | POA: Diagnosis not present

## 2022-08-24 DIAGNOSIS — E118 Type 2 diabetes mellitus with unspecified complications: Secondary | ICD-10-CM | POA: Diagnosis not present

## 2022-08-24 LAB — CBC
HCT: 47.2 % (ref 39.0–52.0)
Hemoglobin: 16 g/dL (ref 13.0–17.0)
MCH: 30.5 pg (ref 26.0–34.0)
MCHC: 33.9 g/dL (ref 30.0–36.0)
MCV: 89.9 fL (ref 80.0–100.0)
Platelets: 241 10*3/uL (ref 150–400)
RBC: 5.25 MIL/uL (ref 4.22–5.81)
RDW: 16 % — ABNORMAL HIGH (ref 11.5–15.5)
WBC: 13.4 10*3/uL — ABNORMAL HIGH (ref 4.0–10.5)
nRBC: 0 % (ref 0.0–0.2)

## 2022-08-24 LAB — BASIC METABOLIC PANEL
Anion gap: 19 — ABNORMAL HIGH (ref 5–15)
BUN: 15 mg/dL (ref 8–23)
CO2: 16 mmol/L — ABNORMAL LOW (ref 22–32)
Calcium: 9.4 mg/dL (ref 8.9–10.3)
Chloride: 102 mmol/L (ref 98–111)
Creatinine, Ser: 1.32 mg/dL — ABNORMAL HIGH (ref 0.61–1.24)
GFR, Estimated: 55 mL/min — ABNORMAL LOW (ref >60)
Glucose, Bld: 157 mg/dL — ABNORMAL HIGH (ref 70–99)
Potassium: 3.3 mmol/L — ABNORMAL LOW (ref 3.5–5.1)
Sodium: 137 mmol/L (ref 135–145)

## 2022-08-24 LAB — GLUCOSE, CAPILLARY
Glucose-Capillary: 177 mg/dL — ABNORMAL HIGH (ref 70–99)
Glucose-Capillary: 210 mg/dL — ABNORMAL HIGH (ref 70–99)
Glucose-Capillary: 227 mg/dL — ABNORMAL HIGH (ref 70–99)

## 2022-08-24 LAB — MRSA NEXT GEN BY PCR, NASAL: MRSA by PCR Next Gen: NOT DETECTED

## 2022-08-24 MED ORDER — FAMOTIDINE 20 MG PO TABS
20.0000 mg | ORAL_TABLET | Freq: Every day | ORAL | Status: DC
  Administered 2022-08-24 – 2022-08-26 (×3): 20 mg via ORAL
  Filled 2022-08-24 (×3): qty 1

## 2022-08-24 MED ORDER — APIXABAN (ELIQUIS) VTE STARTER PACK (10MG AND 5MG)
ORAL_TABLET | ORAL | 0 refills | Status: DC
  Filled 2022-08-24: qty 74, 30d supply, fill #0

## 2022-08-24 MED ORDER — ALUM & MAG HYDROXIDE-SIMETH 200-200-20 MG/5ML PO SUSP
30.0000 mL | ORAL | Status: DC | PRN
  Administered 2022-08-24 (×3): 30 mL via ORAL
  Filled 2022-08-24 (×3): qty 30

## 2022-08-24 MED ORDER — POTASSIUM CHLORIDE CRYS ER 20 MEQ PO TBCR
60.0000 meq | EXTENDED_RELEASE_TABLET | Freq: Once | ORAL | Status: AC
  Administered 2022-08-24: 60 meq via ORAL
  Filled 2022-08-24: qty 3

## 2022-08-24 NOTE — Progress Notes (Signed)
Transported to radiology for CT scan by bed  awake and alert.

## 2022-08-24 NOTE — Progress Notes (Signed)
Complained of heartburn claimed that maalox is not helping him and  requested  for pepcid. MD made aware with order. Continue to monitor.

## 2022-08-24 NOTE — TOC Benefit Eligibility Note (Signed)
Patient Teacher, English as a foreign language completed.    The patient is currently admitted and upon discharge could be taking Eliquis 5 mg.  The current 30 day co-pay is $139.76 due to being in the Coverage Gap (donut hole).   The patient is currently admitted and upon discharge could be taking Xarelto 20 mg.  The current 30 day co-pay is $133.93 due to being in the Coverage Gap (donut hole)..   The patient is insured through [place], [place] Patient Advocate Specialist [place] Patient Advocate Team Direct Number: [phone]  Fax: [phone]

## 2022-08-24 NOTE — Progress Notes (Signed)
Heart Failure Navigator Progress Note  Assessed for Heart & Vascular TOC clinic readiness.  Patient does not meet criteria due to respiratory distress, due to acute PE. Per MD exam not grossly fluid overloaded. Stable EF of 60%.   Navigator will sign off at this time.    Earnestine Leys, BSN, Clinical cytogeneticist Only

## 2022-08-24 NOTE — Progress Notes (Signed)
Mobility Specialist Progress Note   08/24/22 1500  Mobility  Activity Ambulated with assistance to bathroom;Transferred to/from [facility]  Level of Assistance Contact guard assist, steadying assist  Assistive Device Front wheel walker  Distance Ambulated (ft) 20 ft  Range of Motion/Exercises Active;All extremities  Activity Response Tolerated well   Patient received on [facility] calling out for assistance. Discovered bathroom in a disarray with urine on the floor. Stated he needed to have a BM and didn't call for help. Patient stood from [facility] with supervision and performed pericare independently. Ambulated back to bed at min guard. Tolerated without incident but complained of severe indigestion. RN notified. Was left in supine with all needs met, call bell in reach.   [place] Onofre Gains, BS EXP Mobility Specialist Please contact via SecureChat or Rehab office at [phone]

## 2022-08-24 NOTE — Progress Notes (Signed)
Meds from [place] was sent down to main pharmacy to be picked up upon discharge. Patient and family made aware instructed to let the nurse know. Med. Slip placed on the chart.

## 2022-08-24 NOTE — Progress Notes (Signed)
Assisted by the NT to the bathroom  and washed up by the sink. Assisted  back to bed.according to NT pt claimed that he feels weak. When about to give his am meds. MD was at the bedside. Pt not answering questions, hardly opening his eyes. Pt had a dose of ativan from last night due to restlessness. Wife at bedside. Seen by PT. Managed to  take all am  meds. But went back to sleep. Md aware. Continue to monitor.

## 2022-08-24 NOTE — Progress Notes (Addendum)
Mobility Specialist Progress Note   08/24/22 1106  Mobility  Activity Ambulated with assistance in room;Ambulated with assistance to bathroom;  Level of Assistance Contact guard assist, steadying assist  Assistive Device Front wheel walker  Distance Ambulated (ft) 20 ft  Range of Motion/Exercises Active;All extremities  Activity Response Tolerated well   Patient received in supine requesting assistance to bathroom. Was independent for bed mobility and stood with minimal HHA. Ambulated min guard to and bathroom without complaint or incident. Was left in supine with all needs met, call bell in reach.   James [name], BS EXP Mobility Specialist Please contact via SecureChat or Rehab office at [phone]

## 2022-08-24 NOTE — Progress Notes (Signed)
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 honeycombing in the bases. An oval noncalcified subfissural right middle lobe nodule again measures 10 x 0.6 mm, stable. No focal pneumonia is evident and no further nodules. There is no pleural effusion, thickening or pneumothorax. Upper Abdomen: No acute abnormality. Multiple hepatic cysts. Old cholecystectomy. Musculoskeletal: There is osteopenia with multilevel thoracic compression fractures, somewhat mottled appearance of the bones consistent with patient's history of multiple myeloma, with profound osteopenia. There are healed fracture deformities of multiple ribs. Review of the MIP images confirms the above findings. IMPRESSION: 1. Acute right upper lobe anterior segmental and subsegmental emboli with small  clot burden. No visible right heart strain findings or IVC reflux. No other appreciable emboli. 2. Cardiomegaly with small pericardial effusion, calcific CAD, and aortic atherosclerosis. 3. Stable 10 x 0.6 cm right middle lobe nodule. 4. Stable interstitial lung disease with basilar gradient and basilar ground-glass opacities and bronchiolectasis. No focal pneumonia or pleural effusion. 5. Osteopenia with multilevel thoracic compression fractures and mottled appearance of the bones consistent with multiple myeloma. 6. 2.3 cm hypodense nodule in the left lobe of the thyroid gland. Ultrasound recommended. 7. Multiple hepatic cysts. 8. Old healed rib fractures. Aortic Atherosclerosis (ICD10-I70.0). Electronically Signed   By: Telford Nab M.D.   On: 08/23/2022 02:35   DG Chest Portable 1 View  Result Date: 08/23/2022 CLINICAL DATA:  Shortness of breath. EXAM: PORTABLE CHEST 1 VIEW COMPARISON:  August 29, 2021 FINDINGS: The cardiac silhouette is mildly enlarged and unchanged in size. There is moderate severity calcification of the aortic arch. Mild to moderate severity increased interstitial lung markings are seen. There is no evidence of focal consolidation, pleural effusion or pneumothorax. Multilevel degenerative changes are seen throughout the thoracic spine. IMPRESSION: Mild to moderate severity increased interstitial lung markings, likely chronic in nature. A superimposed component of interstitial edema cannot be excluded. Electronically Signed   By: Virgina Norfolk M.D.   On: 08/23/2022 00:41        Scheduled Meds:  amLODipine  10 mg Oral q morning   apixaban  10 mg Oral BID   Followed by   Derrill Memo ON 08/30/2022] apixaban  5 mg Oral BID   colestipol  1 g Oral BID WC   fluticasone furoate-vilanterol  1 puff Inhalation Daily   And   umeclidinium bromide  1 puff Inhalation Daily   furosemide  20 mg Oral Daily   gabapentin  400 mg Oral Daily   And   gabapentin  800 mg Oral QHS    HYDROcodone-acetaminophen  1 tablet Oral TID   insulin aspart  0-15 Units Subcutaneous TID WC   insulin aspart  0-5 Units Subcutaneous QHS   losartan  50 mg Oral q morning   sodium chloride flush  3 mL Intravenous Q12H   tamsulosin  0.4 mg Oral BID   Continuous Infusions:   LOS: 1 day    Time spent: 35 minutes    Barb Merino, MD [facility] Pager [phone]

## 2022-08-24 NOTE — TOC Initial Note (Addendum)
Transition of Care [facility]) - Initial/Assessment Note    Patient Details  Name: James [name]. MRN: [id number] Date of Birth: 1945/11/30  [facility]) CM/SW Contact:    Cyndi Bender, RN Phone Number: 08/24/2022, 3:20 PM  Clinical Narrative:                  Spoke to wife and patient at bedside regarding transition needs.  Patient is agreeable to Home health. Choice offered and paitent deferred to [facility] to find highly rated agency.  Cory with [facility] accepted referral. Wife can help with transportation. Patient has walker and all needed DME needs. Address, Phone number and PCP verified. TOC will continue to follow. Expected Discharge Plan: [place] Barriers to Discharge: Continued Medical Work up   Patient Goals and CMS Choice Patient states their goals for this hospitalization and ongoing recovery are:: return fhome CMS Medicare.gov Compare Post Acute Care list provided to:: Patient Choice offered to / list presented to : Patient      Expected Discharge Plan and Services   Discharge Planning Services: CM Consult Post Acute Care Choice: [place] arrangements for the past 2 months: Single Family Home Expected Discharge Date: 08/24/22                         HH Arranged: PT HH Agency: [place] Date [place]: 08/24/22 Time HH Agency Contacted: 1520 Representative spoke with at [place]: Tommi Rumps  Prior Living Arrangements/Services Living arrangements for the past 2 months: [place] Lives with:: Spouse Patient language and need for interpreter reviewed:: Yes Do you feel safe going back to the place where you live?: Yes      Need for Family Participation in Patient Care: Yes (Comment) Care giver support system in place?: Yes (comment) Current home services: DME (walker) Criminal Activity/Legal Involvement Pertinent to Current Situation/Hospitalization: No - Comment as  needed  Activities of Daily Living Home Assistive Devices/Equipment: Cane (specify quad or straight), CBG Meter, Eyeglasses ADL Screening (condition at time of admission) Patient's cognitive ability adequate to safely complete daily activities?: Yes Is the patient deaf or have difficulty hearing?: No Does the patient have difficulty seeing, even when wearing glasses/contacts?: No Does the patient have difficulty concentrating, remembering, or making decisions?: Yes Patient able to express need for assistance with ADLs?: Yes Does the patient have difficulty dressing or bathing?: No Independently performs ADLs?: No Communication: Independent Dressing (OT): Needs assistance Is this a change from baseline?: Change from baseline, expected to last <3days Grooming: Independent Feeding: Independent Bathing: Needs assistance Is this a change from baseline?: Change from baseline, expected to last <3 days Toileting: Needs assistance Is this a change from baseline?: Change from baseline, expected to last <3 days In/Out Bed: Needs assistance Is this a change from baseline?: Change from baseline, expected to last <3 days Walks in Home: Independent with device (comment) Does the patient have difficulty walking or climbing stairs?: Yes Weakness of Legs: Both Weakness of Arms/Hands: None  Permission Sought/Granted Permission sought to share information with : Investment banker, corporate granted to share info w AGENCY: HH        Emotional Assessment Appearance:: Appears stated age Attitude/Demeanor/Rapport: Gracious Affect (typically observed): Accepting   Alcohol / Substance Use: Not Applicable Psych Involvement: No (comment)  Admission diagnosis:  Acute pulmonary embolism (HCC) [I26.99] Other acute pulmonary embolism, unspecified  whether acute cor pulmonale present ([place]) [I26.99] Patient Active Problem List   Diagnosis Date Noted   Acute pulmonary embolism ([place])  08/23/2022   Hypertensive urgency 08/23/2022   Elevated troponin 08/23/2022   Chronic diastolic CHF (congestive heart failure) ([place]) 08/23/2022   COPD (chronic obstructive pulmonary disease) ([place]) 08/23/2022   Prolonged QT interval 08/23/2022   Type 2 diabetes mellitus with complication, with long-term current use of insulin ([place]) 08/23/2022   Chronic diarrhea 08/23/2022   Chronic back pain 08/23/2022   Compression fracture of body of thoracic vertebra ([place]) 08/23/2022   Compression fracture of lumbar vertebra ([place]) 08/23/2022   BPH (benign prostatic hyperplasia) 08/23/2022   Hoarseness, chronic 02/18/2021   Coronary artery disease involving native coronary artery of native heart without angina pectoris 10/09/2018   RBBB 10/09/2018   Essential hypertension 10/09/2018   Multiple myeloma ([place]) 01/14/2016   Plasma cell disorder    PCP:  Ashley Akin, NP Pharmacy:   [id number] [place]Phone: [phone]: [phone] [facility] [place]- [address]Phone: [phone]: [phone] [address]Phone: [phone]: [phone] [facility] [address]Phone: [phone]: [phone]    Social Determinants of Health (SDOH) Social History: SDOH Screenings   Tobacco Use: Medium Risk (08/19/2022)   SDOH Interventions:     Readmission Risk Interventions     No data to display

## 2022-08-24 NOTE — Plan of Care (Signed)
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
## 2022-08-25 ENCOUNTER — Inpatient Hospital Stay (HOSPITAL_COMMUNITY): Payer: Medicare HMO

## 2022-08-25 DIAGNOSIS — I160 Hypertensive urgency: Secondary | ICD-10-CM | POA: Diagnosis not present

## 2022-08-25 DIAGNOSIS — I2699 Other pulmonary embolism without acute cor pulmonale: Secondary | ICD-10-CM | POA: Diagnosis not present

## 2022-08-25 DIAGNOSIS — E118 Type 2 diabetes mellitus with unspecified complications: Secondary | ICD-10-CM | POA: Diagnosis not present

## 2022-08-25 DIAGNOSIS — Z794 Long term (current) use of insulin: Secondary | ICD-10-CM | POA: Diagnosis not present

## 2022-08-25 LAB — CBC WITH DIFFERENTIAL/PLATELET
Abs Immature Granulocytes: 0.03 10*3/uL (ref 0.00–0.07)
Basophils Absolute: 0.1 10*3/uL (ref 0.0–0.1)
Basophils Relative: 1 %
Eosinophils Absolute: 0 10*3/uL (ref 0.0–0.5)
Eosinophils Relative: 0 %
HCT: 42 % (ref 39.0–52.0)
Hemoglobin: 14.4 g/dL (ref 13.0–17.0)
Immature Granulocytes: 0 %
Lymphocytes Relative: 10 %
Lymphs Abs: 1 10*3/uL (ref 0.7–4.0)
MCH: 31.5 pg (ref 26.0–34.0)
MCHC: 34.3 g/dL (ref 30.0–36.0)
MCV: 91.9 fL (ref 80.0–100.0)
Monocytes Absolute: 1 10*3/uL (ref 0.1–1.0)
Monocytes Relative: 10 %
Neutro Abs: 7.9 10*3/uL — ABNORMAL HIGH (ref 1.7–7.7)
Neutrophils Relative %: 79 %
Platelets: 226 10*3/uL (ref 150–400)
RBC: 4.57 MIL/uL (ref 4.22–5.81)
RDW: 16.4 % — ABNORMAL HIGH (ref 11.5–15.5)
WBC: 10 10*3/uL (ref 4.0–10.5)
nRBC: 0 % (ref 0.0–0.2)

## 2022-08-25 LAB — BASIC METABOLIC PANEL
Anion gap: 12 (ref 5–15)
BUN: 30 mg/dL — ABNORMAL HIGH (ref 8–23)
CO2: 21 mmol/L — ABNORMAL LOW (ref 22–32)
Calcium: 8.4 mg/dL — ABNORMAL LOW (ref 8.9–10.3)
Chloride: 105 mmol/L (ref 98–111)
Creatinine, Ser: 1.99 mg/dL — ABNORMAL HIGH (ref 0.61–1.24)
GFR, Estimated: 34 mL/min — ABNORMAL LOW (ref >60)
Glucose, Bld: 157 mg/dL — ABNORMAL HIGH (ref 70–99)
Potassium: 3.3 mmol/L — ABNORMAL LOW (ref 3.5–5.1)
Sodium: 138 mmol/L (ref 135–145)

## 2022-08-25 LAB — GLUCOSE, CAPILLARY
Glucose-Capillary: 220 mg/dL — ABNORMAL HIGH (ref 70–99)
Glucose-Capillary: 179 mg/dL — ABNORMAL HIGH (ref 70–99)
Glucose-Capillary: 135 mg/dL — ABNORMAL HIGH (ref 70–99)
Glucose-Capillary: 151 mg/dL — ABNORMAL HIGH (ref 70–99)
Glucose-Capillary: 182 mg/dL — ABNORMAL HIGH (ref 70–99)

## 2022-08-25 LAB — MAGNESIUM: Magnesium: 2.1 mg/dL (ref 1.7–2.4)

## 2022-08-25 LAB — PHOSPHORUS: Phosphorus: 2.7 mg/dL (ref 2.5–4.6)

## 2022-08-25 MED ORDER — SODIUM CHLORIDE 0.9 % IV SOLN
INTRAVENOUS | Status: DC
  Administered 2022-08-25 (×2): 1000 mL via INTRAVENOUS

## 2022-08-25 MED ORDER — LENALIDOMIDE 15 MG PO CAPS: 15.0000 mg | ORAL_CAPSULE | Freq: Every day | ORAL | Status: DC

## 2022-08-25 NOTE — Care Management Important Message (Signed)
Important Message  Patient Details  Name: James [name]. MRN: [id number] Date of Birth: [birth year]   Medicare Important Message Given:  Yes     Shelda Altes 08/25/2022, 2:33 PM
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
## 2022-08-25 NOTE — Progress Notes (Signed)
Pt has TOC meds in main pharmacy to be picked up prior to discharge

## 2022-08-25 NOTE — Progress Notes (Addendum)
Mobility Specialist Progress Note    08/25/22 0850  Mobility  Activity Ambulated with assistance in hallway  Level of Assistance Minimal assist, patient does 75% or more  Assistive Device Front wheel walker  Distance Ambulated (ft) 60 ft  Activity Response Tolerated fair  Mobility Referral Yes  $Mobility charge 1 Mobility   Pre-Mobility: 98 HR During Mobility: 115 HR Post-Mobility: 109 HR, 116/67 (79) BP  Pt received in bed and agreeable. C/o being a little dizzy. Pt stated he was struggling to swallow his breakfast this am and that it felt like it was getting caught in his throat. Returned to bed with call bell in reach then c/o heartburn. RN notified.   Hildred Alamin Mobility Specialist  Please Psychologist, sport and exercise or Rehab Office at [phone]

## 2022-08-26 DIAGNOSIS — C9000 Multiple myeloma not having achieved remission: Secondary | ICD-10-CM

## 2022-08-26 DIAGNOSIS — I160 Hypertensive urgency: Secondary | ICD-10-CM | POA: Diagnosis not present

## 2022-08-26 DIAGNOSIS — E118 Type 2 diabetes mellitus with unspecified complications: Secondary | ICD-10-CM | POA: Diagnosis not present

## 2022-08-26 DIAGNOSIS — I2699 Other pulmonary embolism without acute cor pulmonale: Secondary | ICD-10-CM | POA: Diagnosis not present

## 2022-08-26 LAB — COMPREHENSIVE METABOLIC PANEL
ALT: 22 U/L (ref 0–44)
AST: 28 U/L (ref 15–41)
Albumin: 3 g/dL — ABNORMAL LOW (ref 3.5–5.0)
Alkaline Phosphatase: 64 U/L (ref 38–126)
Anion gap: 11 (ref 5–15)
BUN: 33 mg/dL — ABNORMAL HIGH (ref 8–23)
CO2: 20 mmol/L — ABNORMAL LOW (ref 22–32)
Calcium: 8.2 mg/dL — ABNORMAL LOW (ref 8.9–10.3)
Chloride: 106 mmol/L (ref 98–111)
Creatinine, Ser: 1.93 mg/dL — ABNORMAL HIGH (ref 0.61–1.24)
GFR, Estimated: 35 mL/min — ABNORMAL LOW (ref >60)
Glucose, Bld: 128 mg/dL — ABNORMAL HIGH (ref 70–99)
Potassium: 3 mmol/L — ABNORMAL LOW (ref 3.5–5.1)
Sodium: 137 mmol/L (ref 135–145)
Total Bilirubin: 1.2 mg/dL (ref 0.3–1.2)
Total Protein: 5.4 g/dL — ABNORMAL LOW (ref 6.5–8.1)

## 2022-08-26 LAB — BASIC METABOLIC PANEL
Anion gap: 11 (ref 5–15)
BUN: 22 mg/dL (ref 8–23)
CO2: 19 mmol/L — ABNORMAL LOW (ref 22–32)
Calcium: 8.4 mg/dL — ABNORMAL LOW (ref 8.9–10.3)
Chloride: 107 mmol/L (ref 98–111)
Creatinine, Ser: 1.51 mg/dL — ABNORMAL HIGH (ref 0.61–1.24)
GFR, Estimated: 47 mL/min — ABNORMAL LOW (ref >60)
Glucose, Bld: 191 mg/dL — ABNORMAL HIGH (ref 70–99)
Potassium: 3.4 mmol/L — ABNORMAL LOW (ref 3.5–5.1)
Sodium: 137 mmol/L (ref 135–145)

## 2022-08-26 LAB — MAGNESIUM: Magnesium: 2 mg/dL (ref 1.7–2.4)

## 2022-08-26 LAB — GLUCOSE, CAPILLARY
Glucose-Capillary: 151 mg/dL — ABNORMAL HIGH (ref 70–99)
Glucose-Capillary: 200 mg/dL — ABNORMAL HIGH (ref 70–99)

## 2022-08-26 MED ORDER — POTASSIUM CHLORIDE CRYS ER 20 MEQ PO TBCR: 20.0000 meq | EXTENDED_RELEASE_TABLET | Freq: Two times a day (BID) | ORAL | 0 refills | Status: DC

## 2022-08-26 MED ORDER — POTASSIUM CHLORIDE CRYS ER 20 MEQ PO TBCR
40.0000 meq | EXTENDED_RELEASE_TABLET | ORAL | Status: AC
  Administered 2022-08-26 (×2): 40 meq via ORAL
  Filled 2022-08-26 (×2): qty 2

## 2022-08-26 MED ORDER — POTASSIUM CHLORIDE CRYS ER 20 MEQ PO TBCR: 20.0000 meq | EXTENDED_RELEASE_TABLET | Freq: Two times a day (BID) | ORAL | Status: DC

## 2022-08-26 NOTE — Progress Notes (Signed)
Mobility Specialist Progress Note    08/26/22 1412  Mobility  Activity Ambulated with assistance in hallway  Level of Assistance Contact guard assist, steadying assist  Assistive Device Other (Comment) (HHA)  Distance Ambulated (ft) 200 ft  Activity Response Tolerated well  $Mobility charge 1 Mobility   Pre-Mobility: 86 HR During Mobility: 108 HR  Pt received in bed and agreeable. No complaints on walk. Returned to sitting EOB with call bell in reach.    Hildred Alamin Mobility Specialist  Please Psychologist, sport and exercise or Rehab Office at [phone]

## 2022-08-26 NOTE — TOC Transition Note (Signed)
Transition of Care [facility]) - CM/SW Discharge Note   Patient Details  Name: James [name]. MRN: [id number] Date of Birth: 27-Jan-1946  Transition of Care [facility]) CM/SW Contact:  Carles Collet, RN Phone Number: 08/26/2022, 2:19 PM   Clinical Narrative:     [facility] that patient will DCtoday. Meds filled by [facility] pharmacy yesterday, nurse aware.     Barriers to Discharge: Continued Medical Work up   Patient Goals and CMS Choice CMS Medicare.gov Compare Post Acute Care list provided to:: Patient Choice offered to / list presented to : Patient  Discharge Placement                         Discharge Plan and Services Additional resources added to the After Visit Summary for     Discharge Planning Services: CM Consult Post Acute Care Choice: Home Health                    HH Arranged: PT [place]: [place] Date [place]: 08/24/22 Time [place]: 655[address] Representative spoke with at [place]: Tommi Rumps  Social Determinants of Health ([place]) Interventions SDOH Screenings   Tobacco Use: Medium Risk (08/19/2022)     Readmission Risk Interventions     No data to display

## 2022-08-26 NOTE — Discharge Summary (Signed)
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 collection/handling, submission of specimen other than nasopharyngeal swab, presence of viral mutation(s) within the areas targeted by this assay, and inadequate  number of viral copies(<138 copies/mL). A negative result must be combined with clinical observations, patient history, and epidemiological information. The expected result is Negative.  Fact Sheet for Patients:  EntrepreneurPulse.com.au  Fact Sheet for Healthcare Providers:  IncredibleEmployment.be  This test is no t yet approved or cleared by the [place] FDA and  has been authorized for detection and/or diagnosis of SARS-CoV-2 by FDA under an Emergency Use Authorization (EUA). This EUA will remain  in effect (meaning this test can be used) for the duration of the COVID-19 declaration under Section 564(b)(1) of the Act, 21 U.S.C.section 360bbb-3(b)(1), unless the authorization is terminated  or revoked sooner.       Influenza A by PCR NEGATIVE NEGATIVE Final   Influenza B by PCR NEGATIVE NEGATIVE Final    Comment: (NOTE) The Xpert Xpress SARS-CoV-2/FLU/RSV plus assay is intended as an aid in the diagnosis of influenza from Nasopharyngeal swab specimens and should not be used as a sole basis for treatment. Nasal washings and aspirates are unacceptable for Xpert Xpress SARS-CoV-2/FLU/RSV testing.  Fact Sheet for Patients: EntrepreneurPulse.com.au  Fact Sheet for Healthcare Providers: IncredibleEmployment.be  This test is not  yet approved or cleared by the [place] and has been authorized for detection and/or diagnosis of SARS-CoV-2 by FDA under an Emergency Use Authorization (EUA). This EUA will remain in effect (meaning this test can be used) for the duration of the COVID-19 declaration under Section 564(b)(1) of the Act, 21 U.S.C. section 360bbb-3(b)(1), unless the authorization is terminated or revoked.     Resp Syncytial Virus by PCR NEGATIVE NEGATIVE Final    Comment: (NOTE) Fact Sheet for Patients: EntrepreneurPulse.com.au  Fact Sheet for Healthcare  Providers: IncredibleEmployment.be  This test is not yet approved or cleared by the [place] FDA and has been authorized for detection and/or diagnosis of SARS-CoV-2 by FDA under an Emergency Use Authorization (EUA). This EUA will remain in effect (meaning this test can be used) for the duration of the COVID-19 declaration under Section 564(b)(1) of the Act, 21 U.S.C. section 360bbb-3(b)(1), unless the authorization is terminated or revoked.  Performed at [facility], [place] [address], [place], [place] [address]   MRSA Next Gen by PCR, Nasal     Status: None   Collection Time: 08/23/22 11:56 PM   Specimen: Nasal Mucosa; Nasal Swab  Result Value Ref Range Status   MRSA by PCR Next Gen NOT DETECTED NOT DETECTED Final    Comment: (NOTE) The GeneXpert MRSA Assay (FDA approved for NASAL specimens only), is one component of a comprehensive MRSA colonization surveillance program. It is not intended to diagnose MRSA infection nor to guide or monitor treatment for MRSA infections. Test performance is not FDA approved in patients less than 42 years old. Performed at [facility], [place] [address], [place], [place] [address]      Labs: BNP (last 3 results) Recent Labs    08/23/22 0022  BNP 751.7*   Basic Metabolic Panel: Recent Labs  Lab 08/23/22 0022 08/23/22 0029 08/24/22 0027 08/25/22 0029 08/26/22 0001 08/26/22 1154  NA 136 139 137 138 137 137  K 3.4* 3.5 3.3* 3.3* 3.0* 3.4*  CL 104 103 102 105 106 107  CO2 20*  --  16* 21* 20* 19*  GLUCOSE 134* 132* 157* 157* 128* 191*  BUN 6* 6* 15 30* 33* 22  CREATININE 1.11 1.00 1.32* 1.99* 1.93* 1.51*  CALCIUM 9.2  --  9.4 8.4* 8.2* 8.4*  MG  --   --   --  2.1 2.0  --   PHOS  --   --   --  2.7  --   --    Liver Function Tests: Recent Labs  Lab 08/23/22 0022 08/26/22 0001  AST 27 28  ALT 18 22  ALKPHOS 88 64  BILITOT 1.1 1.2  PROT 6.7 5.4*  ALBUMIN 3.9 3.0*   No results for input(s):  "LIPASE", "AMYLASE" in the last 168 hours. No results for input(s): "AMMONIA" in the last 168 hours. CBC: Recent Labs  Lab 08/19/22 1351 08/23/22 0022 08/23/22 0029 08/24/22 0027 08/25/22 0029  WBC  --  7.4  --  13.4* 10.0  NEUTROABS  --  4.9  --   --  7.9*  HGB 12.9* 13.6 13.9 16.0 14.4  HCT 38.0* 40.8 41.0 47.2 42.0  MCV  --  94.0  --  89.9 91.9  PLT  --  274  --  241 226   Cardiac Enzymes: No results for input(s): "CKTOTAL", "CKMB", "CKMBINDEX", "TROPONINI" in the last 168 hours. BNP: Invalid input(s): "POCBNP" CBG: Recent Labs  Lab 08/25/22 1108 08/25/22 1633 08/25/22 2055  08/26/22 0632 08/26/22 1118  GLUCAP 135* 151* 182* 151* 200*   D-Dimer No results for input(s): "DDIMER" in the last 72 hours. Hgb A1c No results for input(s): "HGBA1C" in the last 72 hours. Lipid Profile No results for input(s): "CHOL", "HDL", "LDLCALC", "TRIG", "CHOLHDL", "LDLDIRECT" in the last 72 hours. Thyroid function studies No results for input(s): "TSH", "T4TOTAL", "T3FREE", "THYROIDAB" in the last 72 hours.  Invalid input(s): "FREET3" Anemia work up No results for input(s): "VITAMINB12", "FOLATE", "FERRITIN", "TIBC", "IRON", "RETICCTPCT" in the last 72 hours. Urinalysis    Component Value Date/Time   COLORURINE STRAW (A) 08/23/2022 1218   APPEARANCEUR CLEAR 08/23/2022 1218   LABSPEC 1.020 08/23/2022 1218   PHURINE 6.0 08/23/2022 1218   GLUCOSEU >=500 (A) 08/23/2022 1218   HGBUR MODERATE (A) 08/23/2022 1218   BILIRUBINUR NEGATIVE 08/23/2022 1218   KETONESUR 5 (A) 08/23/2022 1218   PROTEINUR 100 (A) 08/23/2022 1218   UROBILINOGEN 1.0 10/25/2011 1058   NITRITE NEGATIVE 08/23/2022 1218   LEUKOCYTESUR NEGATIVE 08/23/2022 1218   Sepsis Labs Recent Labs  Lab 08/23/22 0022 08/24/22 0027 08/25/22 0029  WBC 7.4 13.4* 10.0   Microbiology Recent Results (from the past 240 hour(s))  Resp panel by RT-PCR (RSV, Flu A&B, Covid) Anterior Nasal Swab     Status: None   Collection  Time: 08/23/22 12:22 AM   Specimen: Anterior Nasal Swab  Result Value Ref Range Status   SARS Coronavirus 2 by RT PCR NEGATIVE NEGATIVE Final    Comment: (NOTE) SARS-CoV-2 target nucleic acids are NOT DETECTED.  The SARS-CoV-2 RNA is generally detectable in upper respiratory specimens during the acute phase of infection. The lowest concentration of SARS-CoV-2 viral copies this assay can detect is 138 copies/mL. A negative result does not preclude SARS-Cov-2 infection and should not be used as the sole basis for treatment or other patient management decisions. A negative result may occur with  improper specimen collection/handling, submission of specimen other than nasopharyngeal swab, presence of viral mutation(s) within the areas targeted by this assay, and inadequate number of viral copies(<138 copies/mL). A negative result must be combined with clinical observations, patient history, and epidemiological information. The expected result is Negative.  Fact Sheet for Patients:  EntrepreneurPulse.com.au  Fact Sheet for Healthcare Providers:  IncredibleEmployment.be  This test is no t yet approved or cleared by the [place] FDA and  has been authorized for detection and/or diagnosis of SARS-CoV-2 by FDA under an Emergency Use Authorization (EUA). This EUA will remain  in effect (meaning this test can be used) for the duration of the COVID-19 declaration under Section 564(b)(1) of the Act, 21 U.S.C.section 360bbb-3(b)(1), unless the authorization is terminated  or revoked sooner.       Influenza A by PCR NEGATIVE NEGATIVE Final   Influenza B by PCR NEGATIVE NEGATIVE Final    Comment: (NOTE) The Xpert Xpress SARS-CoV-2/FLU/RSV plus assay is intended as an aid in the diagnosis of influenza from Nasopharyngeal swab specimens and should not be used as a sole basis for treatment. Nasal washings and aspirates are unacceptable for Xpert Xpress  SARS-CoV-2/FLU/RSV testing.  Fact Sheet for Patients: EntrepreneurPulse.com.au  Fact Sheet for Healthcare Providers: IncredibleEmployment.be  This test is not yet approved or cleared by the [place] FDA and has been authorized for detection and/or diagnosis of SARS-CoV-2 by FDA under an Emergency Use Authorization (EUA). This EUA will remain in effect (meaning this test can be used) for the duration of the COVID-19 declaration under Section 564(b)(1) of the  Act, 21 U.S.C. section 360bbb-3(b)(1), unless the authorization is terminated or revoked.     Resp Syncytial Virus by PCR NEGATIVE NEGATIVE Final    Comment: (NOTE) Fact Sheet for Patients: EntrepreneurPulse.com.au  Fact Sheet for Healthcare Providers: IncredibleEmployment.be  This test is not yet approved or cleared by the [place] FDA and has been authorized for detection and/or diagnosis of SARS-CoV-2 by FDA under an Emergency Use Authorization (EUA). This EUA will remain in effect (meaning this test can be used) for the duration of the COVID-19 declaration under Section 564(b)(1) of the Act, 21 U.S.C. section 360bbb-3(b)(1), unless the authorization is terminated or revoked.  Performed at [facility], [place] [address] [place], [place] [address]   MRSA Next Gen by PCR, Nasal     Status: None   Collection Time: 08/23/22 11:56 PM   Specimen: Nasal Mucosa; Nasal Swab  Result Value Ref Range Status   MRSA by PCR Next Gen NOT DETECTED NOT DETECTED Final    Comment: (NOTE) The GeneXpert MRSA Assay (FDA approved for NASAL specimens only), is one component of a comprehensive MRSA colonization surveillance program. It is not intended to diagnose MRSA infection nor to guide or monitor treatment for MRSA infections. Test performance is not FDA approved in patients less than 62 years old. Performed at [facility], [place] [address] [place], [place] [address]      Time coordinating discharge: 40 minutes  SIGNED:   Barb Merino, MD  [facility] 08/26/2022, 1:35 PM

## 2022-08-30 ENCOUNTER — Observation Stay (HOSPITAL_COMMUNITY)
Admission: EM | Admit: 2022-08-30 | Discharge: 2022-09-01 | Disposition: A | Discharge status: Home Health | Payer: Medicare HMO | Attending: Internal Medicine | Admitting: Internal Medicine

## 2022-08-30 ENCOUNTER — Other Ambulatory Visit: Payer: Self-pay | Admitting: Oncology

## 2022-08-30 DIAGNOSIS — Z7952 Long term (current) use of systemic steroids: Secondary | ICD-10-CM | POA: Diagnosis not present

## 2022-08-30 DIAGNOSIS — I110 Hypertensive heart disease with heart failure: Secondary | ICD-10-CM | POA: Diagnosis not present

## 2022-08-30 DIAGNOSIS — I10 Essential (primary) hypertension: Secondary | ICD-10-CM | POA: Diagnosis present

## 2022-08-30 DIAGNOSIS — E876 Hypokalemia: Principal | ICD-10-CM | POA: Diagnosis present

## 2022-08-30 DIAGNOSIS — I2699 Other pulmonary embolism without acute cor pulmonale: Secondary | ICD-10-CM | POA: Diagnosis not present

## 2022-08-30 DIAGNOSIS — Z794 Long term (current) use of insulin: Secondary | ICD-10-CM | POA: Insufficient documentation

## 2022-08-30 DIAGNOSIS — J449 Chronic obstructive pulmonary disease, unspecified: Secondary | ICD-10-CM | POA: Diagnosis not present

## 2022-08-30 DIAGNOSIS — G4700 Insomnia, unspecified: Secondary | ICD-10-CM | POA: Diagnosis not present

## 2022-08-30 DIAGNOSIS — K529 Noninfective gastroenteritis and colitis, unspecified: Secondary | ICD-10-CM | POA: Diagnosis present

## 2022-08-30 DIAGNOSIS — Z7901 Long term (current) use of anticoagulants: Secondary | ICD-10-CM | POA: Diagnosis not present

## 2022-08-30 DIAGNOSIS — R197 Diarrhea, unspecified: Secondary | ICD-10-CM

## 2022-08-30 DIAGNOSIS — R059 Cough, unspecified: Secondary | ICD-10-CM | POA: Insufficient documentation

## 2022-08-30 DIAGNOSIS — I5032 Chronic diastolic (congestive) heart failure: Secondary | ICD-10-CM | POA: Diagnosis not present

## 2022-08-30 DIAGNOSIS — C9001 Multiple myeloma in remission: Secondary | ICD-10-CM

## 2022-08-30 DIAGNOSIS — Z79899 Other long term (current) drug therapy: Secondary | ICD-10-CM | POA: Insufficient documentation

## 2022-08-30 DIAGNOSIS — C9000 Multiple myeloma not having achieved remission: Secondary | ICD-10-CM | POA: Diagnosis present

## 2022-08-30 DIAGNOSIS — Z87891 Personal history of nicotine dependence: Secondary | ICD-10-CM | POA: Diagnosis not present

## 2022-08-30 DIAGNOSIS — N400 Enlarged prostate without lower urinary tract symptoms: Secondary | ICD-10-CM | POA: Diagnosis present

## 2022-08-30 DIAGNOSIS — E119 Type 2 diabetes mellitus without complications: Secondary | ICD-10-CM | POA: Diagnosis not present

## 2022-08-30 DIAGNOSIS — E118 Type 2 diabetes mellitus with unspecified complications: Secondary | ICD-10-CM

## 2022-08-30 DIAGNOSIS — G8929 Other chronic pain: Secondary | ICD-10-CM | POA: Diagnosis present

## 2022-08-30 LAB — COMPREHENSIVE METABOLIC PANEL
ALT: 21 U/L (ref 0–44)
AST: 16 U/L (ref 15–41)
Albumin: 3.8 g/dL (ref 3.5–5.0)
Alkaline Phosphatase: 77 U/L (ref 38–126)
Anion gap: 8 (ref 5–15)
BUN: 10 mg/dL (ref 8–23)
CO2: 24 mmol/L (ref 22–32)
Calcium: 8.3 mg/dL — ABNORMAL LOW (ref 8.9–10.3)
Chloride: 109 mmol/L (ref 98–111)
Creatinine, Ser: 1.17 mg/dL (ref 0.61–1.24)
GFR, Estimated: >60 mL/min (ref >60)
Glucose, Bld: 131 mg/dL — ABNORMAL HIGH (ref 70–99)
Potassium: 2.8 mmol/L — ABNORMAL LOW (ref 3.5–5.1)
Sodium: 141 mmol/L (ref 135–145)
Total Bilirubin: 0.5 mg/dL (ref 0.3–1.2)
Total Protein: 7 g/dL (ref 6.5–8.1)

## 2022-08-30 LAB — CBC WITH DIFFERENTIAL/PLATELET
Abs Immature Granulocytes: 0.02 10*3/uL (ref 0.00–0.07)
Basophils Absolute: 0.1 10*3/uL (ref 0.0–0.1)
Basophils Relative: 1 %
Eosinophils Absolute: 0.2 10*3/uL (ref 0.0–0.5)
Eosinophils Relative: 2 %
HCT: 38.4 % — ABNORMAL LOW (ref 39.0–52.0)
Hemoglobin: 12.8 g/dL — ABNORMAL LOW (ref 13.0–17.0)
Immature Granulocytes: 0 %
Lymphocytes Relative: 19 %
Lymphs Abs: 1.2 10*3/uL (ref 0.7–4.0)
MCH: 31.2 pg (ref 26.0–34.0)
MCHC: 33.3 g/dL (ref 30.0–36.0)
MCV: 93.7 fL (ref 80.0–100.0)
Monocytes Absolute: 1.1 10*3/uL — ABNORMAL HIGH (ref 0.1–1.0)
Monocytes Relative: 17 %
Neutro Abs: 4 10*3/uL (ref 1.7–7.7)
Neutrophils Relative %: 61 %
Platelets: 247 10*3/uL (ref 150–400)
RBC: 4.1 MIL/uL — ABNORMAL LOW (ref 4.22–5.81)
RDW: 15.5 % (ref 11.5–15.5)
WBC: 6.6 10*3/uL (ref 4.0–10.5)
nRBC: 0 % (ref 0.0–0.2)

## 2022-08-30 LAB — TSH: TSH: 0.25 u[IU]/mL — ABNORMAL LOW (ref 0.350–4.500)

## 2022-08-30 LAB — MAGNESIUM: Magnesium: 1.6 mg/dL — ABNORMAL LOW (ref 1.7–2.4)

## 2022-08-30 MED ORDER — ALBUTEROL SULFATE HFA 108 (90 BASE) MCG/ACT IN AERS: 1.0000 | INHALATION_SPRAY | RESPIRATORY_TRACT | Status: DC | PRN

## 2022-08-30 MED ORDER — ACETAMINOPHEN 325 MG PO TABS: 650.0000 mg | ORAL_TABLET | Freq: Four times a day (QID) | ORAL | Status: DC | PRN

## 2022-08-30 MED ORDER — LOSARTAN POTASSIUM 50 MG PO TABS
50.0000 mg | ORAL_TABLET | Freq: Every morning | ORAL | Status: DC
  Administered 2022-08-31 – 2022-09-01 (×2): 50 mg via ORAL
  Filled 2022-08-30 (×2): qty 1

## 2022-08-30 MED ORDER — MORPHINE SULFATE ER 30 MG PO TBCR
30.0000 mg | EXTENDED_RELEASE_TABLET | Freq: Two times a day (BID) | ORAL | Status: DC
  Administered 2022-08-31 – 2022-09-01 (×4): 30 mg via ORAL
  Filled 2022-08-30 (×4): qty 1

## 2022-08-30 MED ORDER — UMECLIDINIUM BROMIDE 62.5 MCG/ACT IN AEPB
1.0000 | INHALATION_SPRAY | Freq: Every day | RESPIRATORY_TRACT | Status: DC
  Administered 2022-09-01: 1 via RESPIRATORY_TRACT
  Filled 2022-08-30: qty 7

## 2022-08-30 MED ORDER — PANTOPRAZOLE SODIUM 40 MG PO TBEC
40.0000 mg | DELAYED_RELEASE_TABLET | Freq: Two times a day (BID) | ORAL | Status: DC
  Administered 2022-08-31 – 2022-09-01 (×4): 40 mg via ORAL
  Filled 2022-08-30 (×4): qty 1

## 2022-08-30 MED ORDER — ACETAMINOPHEN 650 MG RE SUPP: 650.0000 mg | Freq: Four times a day (QID) | RECTAL | Status: DC | PRN

## 2022-08-30 MED ORDER — FLUTICASONE FUROATE-VILANTEROL 100-25 MCG/ACT IN AEPB
1.0000 | INHALATION_SPRAY | Freq: Every day | RESPIRATORY_TRACT | Status: DC
  Administered 2022-09-01: 1 via RESPIRATORY_TRACT
  Filled 2022-08-30: qty 28

## 2022-08-30 MED ORDER — APIXABAN (ELIQUIS) VTE STARTER PACK (10MG AND 5MG): ORAL_TABLET | ORAL | Status: DC

## 2022-08-30 MED ORDER — TAMSULOSIN HCL 0.4 MG PO CAPS
0.4000 mg | ORAL_CAPSULE | Freq: Two times a day (BID) | ORAL | Status: DC
  Administered 2022-08-31 – 2022-09-01 (×3): 0.4 mg via ORAL
  Filled 2022-08-30 (×3): qty 1

## 2022-08-30 MED ORDER — HYDROCODONE-ACETAMINOPHEN 10-325 MG PO TABS: 1.0000 | ORAL_TABLET | Freq: Four times a day (QID) | ORAL | Status: DC | PRN

## 2022-08-30 MED ORDER — FINASTERIDE 5 MG PO TABS
5.0000 mg | ORAL_TABLET | Freq: Every day | ORAL | Status: DC
  Administered 2022-08-31 – 2022-09-01 (×2): 5 mg via ORAL
  Filled 2022-08-30 (×2): qty 1

## 2022-08-30 MED ORDER — POTASSIUM CHLORIDE CRYS ER 10 MEQ PO TBCR
10.0000 meq | EXTENDED_RELEASE_TABLET | Freq: Every day | ORAL | Status: DC
  Administered 2022-08-31: 10 meq via ORAL
  Filled 2022-08-30: qty 1

## 2022-08-30 MED ORDER — POTASSIUM CHLORIDE 10 MEQ/100ML IV SOLN
10.0000 meq | Freq: Once | INTRAVENOUS | Status: AC
  Administered 2022-08-30: 10 meq via INTRAVENOUS
  Filled 2022-08-30: qty 100

## 2022-08-30 MED ORDER — PROCHLORPERAZINE EDISYLATE 10 MG/2ML IJ SOLN: 10.0000 mg | Freq: Four times a day (QID) | INTRAMUSCULAR | Status: DC | PRN

## 2022-08-30 MED ORDER — MAGNESIUM SULFATE 2 GM/50ML IV SOLN
2.0000 g | Freq: Once | INTRAVENOUS | Status: AC
  Administered 2022-08-30: 2 g via INTRAVENOUS
  Filled 2022-08-30: qty 50

## 2022-08-30 MED ORDER — GABAPENTIN 400 MG PO CAPS
800.0000 mg | ORAL_CAPSULE | Freq: Every day | ORAL | Status: DC
  Administered 2022-08-31 (×2): 800 mg via ORAL
  Filled 2022-08-30 (×2): qty 2

## 2022-08-30 MED ORDER — AMLODIPINE BESYLATE 10 MG PO TABS
10.0000 mg | ORAL_TABLET | Freq: Every morning | ORAL | Status: DC
  Administered 2022-08-31 – 2022-09-01 (×2): 10 mg via ORAL
  Filled 2022-08-30 (×2): qty 1

## 2022-08-30 MED ORDER — SODIUM CHLORIDE 0.9% FLUSH
3.0000 mL | Freq: Two times a day (BID) | INTRAVENOUS | Status: DC
  Administered 2022-08-31 (×2): 3 mL via INTRAVENOUS

## 2022-08-30 MED ORDER — POTASSIUM CHLORIDE CRYS ER 20 MEQ PO TBCR
40.0000 meq | EXTENDED_RELEASE_TABLET | Freq: Once | ORAL | Status: AC
  Administered 2022-08-30: 40 meq via ORAL
  Filled 2022-08-30: qty 2

## 2022-08-30 NOTE — Hospital Course (Signed)
James [name]. is a 77 y.o. male with medical history significant for multiple myeloma in remission, COPD with ILD, T2DM, HTN, HLD, BPH, chronic diarrhea, chronic back pain on chronic narcotics, and recent diagnosis of PE who is admitted with hypokalemia and hypomagnesemia in setting of acute on chronic diarrhea.

## 2022-08-30 NOTE — ED Triage Notes (Signed)
Pt states he was recently discharged from the hospital and since then has been c/o insomnia, "shaking all over", chills, diarrhea, and intermittent diffuse chest pain.

## 2022-08-30 NOTE — ED Provider Notes (Signed)
James [name]   CSN: [id number] Arrival date & time: 08/30/22  1703     History  Chief Complaint  Patient presents with   Diarrhea   Insomnia    James Dall. is a 77 y.o. male.   Diarrhea Insomnia  Patient discharged from the hospital 4 days ago.  Had been admitted for pulmonary embolism.  States now he is having some diarrhea.  States he was not having when he left the hospital on Saturday with today being Wednesday.  States that he feels shaky on the inside.  Denies blood in the stool.  Denies fevers.  Occasional cough.  Denies shaking on the outside.    Past Medical History:  Diagnosis Date   Anemia    Arthritis    BPH (benign prostatic hyperplasia)    CHF (congestive heart failure) (HCC)    Chronic pain    Diabetes mellitus    Dyspnea    GERD (gastroesophageal reflux disease)    Headache(784.0)    migraines, sinus headaches   Hepatitis    C and B-treated   HLD (hyperlipidemia)    Hypertension    Leukemia ([place])    Lupus ([place])    Multiple myeloma ([place])    Pneumonia 07/2011   Sleep apnea 03/2018   going for fitting on 04-29-18   Thyroid disease    goiter   Urinary frequency     Home Medications Prior to Admission medications   Medication Sig Start Date End Date Taking? Authorizing Provider  amLODipine (NORVASC) 10 MG tablet Take 10 mg by mouth every morning.     [provider]  APIXABAN Arne Cleveland) VTE STARTER PACK ('10MG'$  AND '5MG'$ ) Take as directed on package: start with 2 tablets (10 mg total) twice daily for 7 days. On day 8, switch to 1 tablet (5 mg total) twice daily. 08/24/22   Barb Merino, MD  bismuth subsalicylate (PEPTO BISMOL) 262 MG/15ML suspension Take 30 mLs by mouth every 6 (six) hours as needed for indigestion or diarrhea or loose stools.    [provider]  Calcium Carbonate-Vitamin D (CALCIUM 600+D PO) Take 1 tablet by mouth in the morning and at bedtime.     [provider]  colestipol (COLESTID) 1 g tablet Take 1 g by mouth 2 (two) times daily. 02/11/20   [provider]  dexamethasone (DECADRON) 4 MG tablet TAKE 5 TABLETS BY MOUTH ONCE A WEEK 04/17/22   Wyatt Portela, MD  diphenhydrAMINE HCl (ZZZQUIL) 50 MG/30ML LIQD Take 30 mLs by mouth at bedtime as needed (sleep).    [provider]  Ferrous Fumarate (HEMOCYTE - 106 MG FE) 324 (106 Fe) MG TABS tablet Take 1 tablet by mouth 2 (two) times daily. 02/15/21   [provider]  finasteride (PROPECIA) 1 MG tablet Take 1 mg by mouth in the morning. 02/16/20   [provider]  Fluticasone-Umeclidin-Vilant (TRELEGY ELLIPTA) 100-62.5-25 MCG/ACT AEPB Inhale 1 puff into the lungs daily.    [provider]  furosemide (LASIX) 20 MG tablet TAKE 1 TABLET BY MOUTH EVERY DAY 08/08/22   Maryjane Hurter, MD  gabapentin (NEURONTIN) 400 MG capsule Take 400-800 mg by mouth See admin instructions. Take '400mg'$  at lunch time and  2 Capsules (800 mg) at bedtime 08/26/15   [provider]  HYDROcodone-acetaminophen (NORCO) 10-325 MG tablet Take 1 tablet by mouth in the morning, at noon, and at bedtime. 09/16/16   [provider]  insulin degludec (TRESIBA FLEXTOUCH) 200 UNIT/ML FlexTouch Pen Inject 30 Units into the skin daily after supper.    [provider]  lidocaine (LIDODERM) 5 % Place 1 patch onto the skin daily. Remove & Discard patch within 12 hours or as directed by MD 01/02/22   Curatolo, Adam, DO  losartan (COZAAR) 50 MG tablet Take 50 mg by mouth every morning.     [provider]  pantoprazole (PROTONIX) 40 MG tablet Take 40 mg by mouth 2 (two) times daily. 12/14/20   [provider]  pioglitazone (ACTOS) 15 MG tablet Take 15 mg by mouth in the morning. 01/13/21   [provider]  potassium chloride SA (KLOR-CON M) 20 MEQ tablet Take 1 tablet (20 mEq total) by mouth 2 (two) times daily for 3 days. 08/26/22 08/29/22   Barb Merino, MD  RESTASIS 0.05 % ophthalmic emulsion Place 1 drop into both eyes 2 (two) times daily as needed for dry eyes. 11/27/19   [provider]  Tamsulosin HCl (FLOMAX) 0.4 MG CAPS Take 0.4 mg by mouth 2 (two) times daily.     [provider]  vitamin B-12 (CYANOCOBALAMIN) 1000 MCG tablet Take 1,000 mcg by mouth in the morning and at bedtime.    [provider]  terazosin (HYTRIN) 5 MG capsule Take 5 mg by mouth 2 (two) times daily.   10/25/11  [provider]      Allergies    Invokana [canagliflozin] and Ace inhibitors    Review of Systems   Review of Systems  Gastrointestinal:  Positive for diarrhea.  Psychiatric/Behavioral:  The patient has insomnia.     Physical Exam Updated Vital Signs BP (!) 159/74   Pulse 85   Temp 98.2 F (36.8 C) (Oral)   Resp (!) 27   SpO2 100%  Physical Exam Vitals and nursing [name].  Cardiovascular:     Rate and Rhythm: Regular rhythm.  Pulmonary:     Breath sounds: No wheezing.  Abdominal:     Tenderness: There is no abdominal tenderness.  Musculoskeletal:        General: No tenderness.     Cervical back: Neck supple.  Skin:    General: Skin is warm.     Capillary Refill: Capillary refill takes less than 2 seconds.  Neurological:     Mental Status: He is alert and oriented to person, place, and time.     ED Results / Procedures / Treatments   Labs (all labs ordered are listed, but only abnormal results are displayed) Labs Reviewed  COMPREHENSIVE METABOLIC PANEL - Abnormal; Notable for the following components:      Result Value   Potassium 2.8 (*)    Glucose, Bld 131 (*)    Calcium 8.3 (*)    All other components within normal limits  CBC WITH DIFFERENTIAL/PLATELET - Abnormal; Notable for the following components:   RBC 4.10 (*)    Hemoglobin 12.8 (*)    HCT 38.4 (*)    Monocytes Absolute 1.1 (*)    All other components within normal limits  MAGNESIUM - Abnormal; Notable for  the following components:   Magnesium 1.6 (*)    All other components within normal limits  TSH - Abnormal; Notable for the following components:   TSH 0.250 (*)    All other components within normal limits  CULTURE, BLOOD (ROUTINE X 2)  CULTURE, BLOOD (ROUTINE X 2)  POC OCCULT BLOOD, ED    EKG None  Radiology No results found.  Procedures Procedures    Medications Ordered in ED Medications  magnesium sulfate IVPB 2 g 50 mL (has no administration in time range)  potassium chloride 10 mEq in 100 mL IVPB (0 mEq Intravenous Stopped 08/30/22 2037)  potassium chloride 10 mEq in 100 mL IVPB (10 mEq Intravenous New Bag/Given 08/30/22 2053)  potassium chloride SA (KLOR-CON M) CR tablet 40 mEq (40 mEq Oral Given 08/30/22 2054)    ED Course/ Medical Decision Making/ A&P                             Medical Decision Making Amount and/or Complexity of Data Reviewed Labs: ordered.  Risk Prescription drug management.   Patient with feeling off.  States he has had chills.  States he is shaky on the inside.  Reviewed lab work and has some mild anemia.  Slightly decreased from recently.  Is on anticoagulation.  Also mild hypokalemia potassium 2.8.  Down from greater than 3.  Will also get blood cultures to rule out infection.  Does appear to have a history of some thyroid issues.  Will get TSH. Magnesium also low.  TSH low.  Has been unable to sleep.  Patient states that he is feeling so bad that if he does not get better he will kill himself by driving off a bridge.  States that with the diarrhea going as frequent as he cannot he can eat and drink and cannot manage this at home.  Also states he never got the medicines from his recent discharge.  Will discuss with hospitalist for possible readmission for further electrolyte correction.       Final Clinical Impression(s) / ED Diagnoses Final diagnoses:  Diarrhea, unspecified type  Hypokalemia  Hypomagnesemia    Rx / DC Orders ED  Discharge Orders     None         Davonna Belling, MD 08/30/22 2244

## 2022-08-30 NOTE — Consult Note (Signed)
History and Physical    James [name]. HER:[id number] DOB: May 01, 1946 DOA: 08/30/2022  PCP: Ashley Akin, NP  Patient coming from: Home  I have personally briefly reviewed patient's old medical records in [place]  Chief Complaint: Diarrhea  HPI: James [name]. is a 77 y.o. male with medical history significant for multiple myeloma in remission, COPD with ILD, T2DM, HTN, HLD, BPH, chronic diarrhea, chronic back pain on chronic narcotics, and recent diagnosis of PE who presented to the ED for evaluation of frequent diarrhea and chills.  Patient was recently admitted 08/23/2022-08/26/2022 for acute RUL anterior segmental and subsegmental pulmonary emboli with small clot burden.  No right heart strain.  He was initially treated with IV heparin and transitioned to Eliquis.  There was concern for Revlimid associated VTE therefore this was discontinued per oncology.  He was also noted to have AKI during admission.  Patient states that for the last 3 days he has been having frequent watery diarrhea.  He is also been feeling anxious, restless, and has not been able to sleep.  He has not seen any obvious blood in his bowel movements.  He has been having some intermittent abdominal pain.  He denies any nausea or vomiting.  He says he has been able to maintain fairly adequate oral intake.  He says that he has not been able to obtain several of his medications prescribed on discharge.  Also states that he has been out of his narcotic pain medications.  On chronic morphine and Norco.  ED Course  Labs/Imaging on admission: I have personally reviewed following labs and imaging studies.  Initial vitals showed BP 140/82, pulse 103, RR 18, temp 97.8 F, SpO2 99% on room air.  Labs show sodium 141, potassium 2.8, magnesium 1.6, bicarb 24, BUN 10, creatinine 1.17, serum glucose 131, calcium 8.3, albumin 3.8, LFTs within normal limits, WBC 6.6, hemoglobin 12.8, platelets 247,000.  TSH  0.250.  Blood cultures in process.  Patient was given IV K 10 mEq x 2, oral K 40 mEq x 1, IV magnesium 2 g.  The hospitalist service was consulted to admit for further evaluation and management.  Review of Systems: All systems reviewed and are negative except as documented in history of present illness above.   Past Medical History:  Diagnosis Date   Anemia    Arthritis    BPH (benign prostatic hyperplasia)    CHF (congestive heart failure) (HCC)    Chronic pain    Diabetes mellitus    Dyspnea    GERD (gastroesophageal reflux disease)    Headache(784.0)    migraines, sinus headaches   Hepatitis    C and B-treated   HLD (hyperlipidemia)    Hypertension    Leukemia ([place])    Lupus ([place])    Multiple myeloma ([place])    Pneumonia 07/2011   Sleep apnea 03/2018   going for fitting on 04-29-18   Thyroid disease    goiter   Urinary frequency     Past Surgical History:  Procedure Laterality Date   ABDOMINAL SURGERY     BACK SURGERY     x 5    BIOPSY  01/02/2020   Procedure: BIOPSY;  Surgeon: Carol Ada, MD;  Location: [facility];  Service: Endoscopy;;   CHOLECYSTECTOMY     COLONOSCOPY     COLONOSCOPY WITH PROPOFOL N/A 01/02/2020   Procedure: COLONOSCOPY WITH PROPOFOL;  Surgeon: Carol Ada, MD;  Location: [facility];  Service: Endoscopy;  Laterality:  N/A;   cyst removal skull  20 years ago   ETHMOIDECTOMY  10/12/2011   Procedure: ETHMOIDECTOMY;  Surgeon: Thornell Sartorius, MD;  Location: [place];  Service: ENT;  Laterality: Bilateral;  bilateral maxillary sinus osteal enlargement, frontal sinusotomy   EYE SURGERY     bilat cataract with lens implants   HAND SURGERY     right finger   HERNIA REPAIR     MICROLARYNGOSCOPY N/A 02/18/2021   Procedure: MICROLARYNGOSCOPY with Excisional Biopsy;  Surgeon: Jerrell Belfast, MD;  Location: [place];  Service: ENT;  Laterality: N/A;   POLYPECTOMY  01/02/2020   Procedure: POLYPECTOMY;  Surgeon: Carol Ada, MD;  Location: [facility];   Service: Endoscopy;;   ROTATOR CUFF REPAIR     bilateral   SHOULDER ARTHROSCOPY WITH ROTATOR CUFF REPAIR Left 05/18/2014   Procedure: SHOULDER ARTHROSCOPY WITH ROTATOR CUFF REPAIR;  Surgeon: Nita Sells, MD;  Location: [place];  Service: Orthopedics;  Laterality: Left;  Left shoulder arthroscopy rotator cuff repair, subacromial decompression   sinus surgery     TONSILLECTOMY     TRIGGER FINGER RELEASE Right 05/02/2018   Procedure: RELEASE TRIGGER FINGER/A-1 PULLEY RIGHT SMALL FINGER;  Surgeon: Leanora Cover, MD;  Location: [place];  Service: Orthopedics;  Laterality: Right;    Social History:  reports that he quit smoking about 38 years ago. His smoking use included cigarettes. He has a 26.00 pack-year smoking history. He has never used smokeless tobacco. He reports that he does not currently use drugs after having used the following drugs: Heroin. He reports that he does not drink alcohol.  Allergies  Allergen Reactions   Invokana [Canagliflozin] Anaphylaxis    Facial/neck/ lip swelling   Ace Inhibitors Cough    Family History  Problem Relation Age of Onset   Hyperlipidemia Mother    Hypertension Mother      Prior to Admission medications   Medication Sig Start Date End Date Taking? Authorizing Provider  amLODipine (NORVASC) 10 MG tablet Take 10 mg by mouth every morning.     [provider]  APIXABAN Arne Cleveland) VTE STARTER PACK ('10MG'$  AND '5MG'$ ) Take as directed on package: start with 2 tablets (10 mg total) twice daily for 7 days. On day 8, switch to 1 tablet (5 mg total) twice daily. 08/24/22   Barb Merino, MD  bismuth subsalicylate (PEPTO BISMOL) 262 MG/15ML suspension Take 30 mLs by mouth every 6 (six) hours as needed for indigestion or diarrhea or loose stools.    [provider]  Calcium Carbonate-Vitamin D (CALCIUM 600+D PO) Take 1 tablet by mouth in the morning and at bedtime.    [provider]   colestipol (COLESTID) 1 g tablet Take 1 g by mouth 2 (two) times daily. 02/11/20   [provider]  dexamethasone (DECADRON) 4 MG tablet TAKE 5 TABLETS BY MOUTH ONCE A WEEK 04/17/22   Wyatt Portela, MD  diphenhydrAMINE HCl (ZZZQUIL) 50 MG/30ML LIQD Take 30 mLs by mouth at bedtime as needed (sleep).    [provider]  Ferrous Fumarate (HEMOCYTE - 106 MG FE) 324 (106 Fe) MG TABS tablet Take 1 tablet by mouth 2 (two) times daily. 02/15/21   [provider]  finasteride (PROPECIA) 1 MG tablet Take 1 mg by mouth in the morning. 02/16/20   [provider]  Fluticasone-Umeclidin-Vilant (TRELEGY ELLIPTA) 100-62.5-25 MCG/ACT AEPB Inhale 1 puff into the lungs daily.    [provider]  furosemide (LASIX) 20 MG  tablet TAKE 1 TABLET BY MOUTH EVERY DAY 08/08/22   Maryjane Hurter, MD  gabapentin (NEURONTIN) 400 MG capsule Take 400-800 mg by mouth See admin instructions. Take '400mg'$  at lunch time and  2 Capsules (800 mg) at bedtime 08/26/15   [provider]  HYDROcodone-acetaminophen (NORCO) 10-325 MG tablet Take 1 tablet by mouth in the morning, at noon, and at bedtime. 09/16/16   [provider]  insulin degludec (TRESIBA FLEXTOUCH) 200 UNIT/ML FlexTouch Pen Inject 30 Units into the skin daily after supper.    [provider]  lidocaine (LIDODERM) 5 % Place 1 patch onto the skin daily. Remove & Discard patch within 12 hours or as directed by MD 01/02/22   Curatolo, Adam, DO  losartan (COZAAR) 50 MG tablet Take 50 mg by mouth every morning.     [provider]  pantoprazole (PROTONIX) 40 MG tablet Take 40 mg by mouth 2 (two) times daily. 12/14/20   [provider]  pioglitazone (ACTOS) 15 MG tablet Take 15 mg by mouth in the morning. 01/13/21   [provider]  potassium chloride SA (KLOR-CON M) 20 MEQ tablet Take 1 tablet (20 mEq total) by mouth 2 (two) times daily for 3 days. 08/26/22 08/29/22  Barb Merino, MD   RESTASIS 0.05 % ophthalmic emulsion Place 1 drop into both eyes 2 (two) times daily as needed for dry eyes. 11/27/19   [provider]  Tamsulosin HCl (FLOMAX) 0.4 MG CAPS Take 0.4 mg by mouth 2 (two) times daily.     [provider]  vitamin B-12 (CYANOCOBALAMIN) 1000 MCG tablet Take 1,000 mcg by mouth in the morning and at bedtime.    [provider]  terazosin (HYTRIN) 5 MG capsule Take 5 mg by mouth 2 (two) times daily.   10/25/11  [provider]    Physical Exam: Vitals:   08/30/22 2006 08/30/22 2100 08/30/22 2134 08/30/22 2200  BP:  (!) 156/67 (!) 180/75 (!) 159/74  Pulse: 84 83 88 85  Resp: 19 (!) 27 (!) 24 (!) 27  Temp:   98.2 F (36.8 C)   TempSrc:   Oral   SpO2: 100% 100% 100% 100%   Constitutional: Resting in bed in the right lateral decubitus position.  NAD, calm. Eyes: EOMI, lids and conjunctivae normal ENMT: Mucous membranes are dry. Posterior pharynx clear of any exudate or lesions.Normal dentition.  Neck: normal, supple, no masses. Respiratory: clear to auscultation bilaterally, no wheezing, no crackles. Normal respiratory effort. No accessory muscle use.  Cardiovascular: Regular rate and rhythm, no murmurs / rubs / gallops. No extremity edema. 2+ pedal pulses. Abdomen: Epigastric tenderness, no masses palpated. No hepatosplenomegaly. Bowel sounds positive.  Musculoskeletal: no clubbing / cyanosis. No joint deformity upper and lower extremities. Good ROM, no contractures. Normal muscle tone.  Skin: no rashes, lesions, ulcers. No induration Neurologic: Sensation intact. Strength 5/5 in all 4.  Psychiatric: Alert and oriented x 3. Normal mood.   EKG: Personally reviewed. Sinus rhythm, rate 95, RBBB and LAFB.  No acute ischemic changes.  Similar to prior.  Assessment/Plan Principal Problem:   Hypokalemia Active Problems:   Acute pulmonary embolism (HCC)   Chronic diastolic CHF (congestive heart failure) (HCC)   COPD (chronic  obstructive pulmonary disease) (HCC)   Type 2 diabetes mellitus with complication, with long-term current use of insulin (HCC)   Chronic diarrhea   Multiple myeloma (HCC)   Chronic back pain   BPH (benign prostatic hyperplasia)   Essential hypertension  Hypomagnesemia   James [name]. is a 77 y.o. male with medical history significant for multiple myeloma in remission, COPD with ILD, T2DM, HTN, HLD, BPH, chronic diarrhea, chronic back pain on chronic narcotics, and recent diagnosis of PE who is admitted with hypokalemia and hypomagnesemia in setting of acute on chronic diarrhea.  Assessment and Plan: Hypokalemia/hypomagnesemia: In setting of GI losses.  Oral and IV supplementation initiated.  Hold Lasix and repeat labs in AM.  Acute on chronic diarrhea: Has been on Colestid for chronic diarrhea, now presenting with increased frequency of watery stools.  He states he has been out of his chronic pain medications and given associated symptoms of restlessness and insomnia raises the question of opioid withdrawal. -Follow GI pathogen and C. difficile panels -Restart home MS Contin 30 mg q12h and Norco 10-325 mg q6h prn  Suppressed TSH/thyroid nodule: TSH 0.250, unclear clinical significance.  Recent thyroid ultrasound showed ill-defined thyroid nodule measuring up to 2.1 cm at the inferior aspect of the left thyroid gland.  Follow-up ultrasound in 1 year was recommended. -Check T4 and T3  Recent PE: Continue Eliquis.  Multiple myeloma: Previously on Revlimid which was discontinued recently by oncology due to PE and remission status.  Scheduled for follow-up with Dr. Lorenso Courier on 09/07/2022.  Chronic back pain: Due to multilevel vertebral compression fractures in setting of multiple myeloma.  Per patient, he has not had his chronic narcotic medications for last few days. -Continue MS Contin 30 mg q12h -Continue Norco 10-325 mg q6h prn -Continue gabapentin  COPD/ILD: Stable, no  wheezing on admission.  Continue Trelegy Ellipta and albuterol as needed.  T2DM: Continue home Tyler Aas, add SSI in hospital.  HFpEF: Appears euvolemic.  EF 60-65% by TTE 08/23/2022.  Holding Lasix with hypokalemia and hypomagnesemia.  Hypertension: Continue losartan and amlodipine.  BPH: Continue Proscar and Flomax.   DVT prophylaxis:  apixaban (ELIQUIS) tablet 10 mg  apixaban (ELIQUIS) tablet 5 mg   Code Status: Full code, confirmed with patient on admission Family Communication: Spouse at bedside Disposition Plan: From home and likely discharge to home pending clinical progress Consults called: None Severity of Illness: The appropriate patient status for this patient is OBSERVATION. Observation status is judged to be reasonable and necessary in order to provide the required intensity of service to ensure the patient's safety. The patient's presenting symptoms, physical exam findings, and initial radiographic and laboratory data in the context of their medical condition is felt to place them at decreased risk for further clinical deterioration. Furthermore, it is anticipated that the patient will be medically stable for discharge from the hospital within 2 midnights of admission.   Zada Finders MD [facility]  If 7PM-7AM, please contact night-coverage [email or web address]  08/31/2022, 12:24 AM

## 2022-08-31 ENCOUNTER — Encounter (HOSPITAL_COMMUNITY): Payer: Self-pay | Admitting: Internal Medicine

## 2022-08-31 ENCOUNTER — Other Ambulatory Visit: Payer: Self-pay

## 2022-08-31 DIAGNOSIS — E876 Hypokalemia: Secondary | ICD-10-CM | POA: Diagnosis not present

## 2022-08-31 LAB — T4, FREE: Free T4: 1.44 ng/dL — ABNORMAL HIGH (ref 0.61–1.12)

## 2022-08-31 LAB — BASIC METABOLIC PANEL
Anion gap: 5 (ref 5–15)
Anion gap: 9 (ref 5–15)
BUN: 7 mg/dL — ABNORMAL LOW (ref 8–23)
BUN: 9 mg/dL (ref 8–23)
CO2: 21 mmol/L — ABNORMAL LOW (ref 22–32)
CO2: 26 mmol/L (ref 22–32)
Calcium: 8 mg/dL — ABNORMAL LOW (ref 8.9–10.3)
Calcium: 8 mg/dL — ABNORMAL LOW (ref 8.9–10.3)
Chloride: 112 mmol/L — ABNORMAL HIGH (ref 98–111)
Chloride: 112 mmol/L — ABNORMAL HIGH (ref 98–111)
Creatinine, Ser: 1.05 mg/dL (ref 0.61–1.24)
Creatinine, Ser: 1.09 mg/dL (ref 0.61–1.24)
GFR, Estimated: >60 mL/min (ref >60)
GFR, Estimated: >60 mL/min (ref >60)
Glucose, Bld: 120 mg/dL — ABNORMAL HIGH (ref 70–99)
Glucose, Bld: 98 mg/dL (ref 70–99)
Potassium: 3 mmol/L — ABNORMAL LOW (ref 3.5–5.1)
Potassium: 3.3 mmol/L — ABNORMAL LOW (ref 3.5–5.1)
Sodium: 142 mmol/L (ref 135–145)
Sodium: 143 mmol/L (ref 135–145)

## 2022-08-31 LAB — GASTROINTESTINAL PANEL BY PCR, STOOL (REPLACES STOOL CULTURE)

## 2022-08-31 LAB — CBC
HCT: 35.1 % — ABNORMAL LOW (ref 39.0–52.0)
Hemoglobin: 11.7 g/dL — ABNORMAL LOW (ref 13.0–17.0)
MCH: 31.7 pg (ref 26.0–34.0)
MCHC: 33.3 g/dL (ref 30.0–36.0)
MCV: 95.1 fL (ref 80.0–100.0)
Platelets: 223 10*3/uL (ref 150–400)
RBC: 3.69 MIL/uL — ABNORMAL LOW (ref 4.22–5.81)
RDW: 15.9 % — ABNORMAL HIGH (ref 11.5–15.5)
WBC: 5.3 10*3/uL (ref 4.0–10.5)
nRBC: 0 % (ref 0.0–0.2)

## 2022-08-31 LAB — GLUCOSE, CAPILLARY
Glucose-Capillary: 79 mg/dL (ref 70–99)
Glucose-Capillary: 144 mg/dL — ABNORMAL HIGH (ref 70–99)
Glucose-Capillary: 145 mg/dL — ABNORMAL HIGH (ref 70–99)
Glucose-Capillary: 150 mg/dL — ABNORMAL HIGH (ref 70–99)

## 2022-08-31 LAB — C DIFFICILE QUICK SCREEN W PCR REFLEX
C Diff antigen: NEGATIVE
C Diff interpretation: NOT DETECTED
C Diff toxin: NEGATIVE

## 2022-08-31 LAB — MAGNESIUM: Magnesium: 2.2 mg/dL (ref 1.7–2.4)

## 2022-08-31 LAB — LIPASE, BLOOD: Lipase: 49 U/L (ref 11–51)

## 2022-08-31 MED ORDER — POTASSIUM CHLORIDE 20 MEQ PO PACK
60.0000 meq | PACK | Freq: Once | ORAL | Status: AC
  Administered 2022-08-31: 60 meq via ORAL
  Filled 2022-08-31: qty 3

## 2022-08-31 MED ORDER — MELATONIN 3 MG PO TABS
3.0000 mg | ORAL_TABLET | Freq: Every evening | ORAL | Status: DC | PRN
  Administered 2022-08-31: 3 mg via ORAL
  Filled 2022-08-31: qty 1

## 2022-08-31 MED ORDER — ZOLPIDEM TARTRATE 5 MG PO TABS
5.0000 mg | ORAL_TABLET | Freq: Every day | ORAL | Status: DC
  Administered 2022-08-31: 5 mg via ORAL
  Filled 2022-08-31: qty 1

## 2022-08-31 MED ORDER — POTASSIUM CHLORIDE CRYS ER 20 MEQ PO TBCR: 20.0000 meq | EXTENDED_RELEASE_TABLET | Freq: Every day | ORAL | Status: DC

## 2022-08-31 MED ORDER — APIXABAN 5 MG PO TABS
10.0000 mg | ORAL_TABLET | Freq: Two times a day (BID) | ORAL | Status: DC
  Administered 2022-08-31 – 2022-09-01 (×4): 10 mg via ORAL
  Filled 2022-08-31 (×4): qty 2

## 2022-08-31 MED ORDER — POTASSIUM CHLORIDE CRYS ER 20 MEQ PO TBCR
40.0000 meq | EXTENDED_RELEASE_TABLET | Freq: Once | ORAL | Status: AC
  Administered 2022-08-31: 40 meq via ORAL
  Filled 2022-08-31: qty 2

## 2022-08-31 MED ORDER — INSULIN ASPART 100 UNIT/ML IJ SOLN
0.0000 [IU] | Freq: Three times a day (TID) | INTRAMUSCULAR | Status: DC
  Administered 2022-08-31 – 2022-09-01 (×3): 1 [IU] via SUBCUTANEOUS
  Filled 2022-08-31: qty 0.09

## 2022-08-31 MED ORDER — MELATONIN 3 MG PO TABS: 3.0000 mg | ORAL_TABLET | Freq: Every day | ORAL | Status: DC

## 2022-08-31 MED ORDER — APIXABAN 5 MG PO TABS: 5.0000 mg | ORAL_TABLET | Freq: Two times a day (BID) | ORAL | Status: DC

## 2022-08-31 MED ORDER — INSULIN DEGLUDEC 200 UNIT/ML ~~LOC~~ SOPN: PEN_INJECTOR | Freq: Every day | SUBCUTANEOUS | Status: DC

## 2022-08-31 MED ORDER — ALBUTEROL SULFATE (2.5 MG/3ML) 0.083% IN NEBU: 2.5000 mg | INHALATION_SOLUTION | RESPIRATORY_TRACT | Status: DC | PRN

## 2022-08-31 MED ORDER — GABAPENTIN 400 MG PO CAPS
400.0000 mg | ORAL_CAPSULE | Freq: Every day | ORAL | Status: DC
  Administered 2022-08-31 – 2022-09-01 (×2): 400 mg via ORAL
  Filled 2022-08-31 (×2): qty 1

## 2022-08-31 NOTE — H&P (Signed)
History and Physical    James [name]. XTK:[id number] DOB: [birth year] DOA: 08/30/2022  PCP: Ashley Akin, NP  Patient coming from: Home  I have personally briefly reviewed patient's old medical records in [place]  Chief Complaint: Diarrhea  HPI: James [name]. is a 77 y.o. male with medical history significant for multiple myeloma in remission, COPD with ILD, T2DM, HTN, HLD, BPH, chronic diarrhea, chronic back pain on chronic narcotics, and recent diagnosis of PE who presented to the ED for evaluation of frequent diarrhea and chills.  Patient was recently admitted 08/23/2022-08/26/2022 for acute RUL anterior segmental and subsegmental pulmonary emboli with small clot burden.  No right heart strain.  He was initially treated with IV heparin and transitioned to Eliquis.  There was concern for Revlimid associated VTE therefore this was discontinued per oncology.  He was also noted to have AKI during admission.  Patient states that for the last 3 days he has been having frequent watery diarrhea.  He is also been feeling anxious, restless, and has not been able to sleep.  He has not seen any obvious blood in his bowel movements.  He has been having some intermittent abdominal pain.  He denies any nausea or vomiting.  He says he has been able to maintain fairly adequate oral intake.  He says that he has not been able to obtain several of his medications prescribed on discharge.  Also states that he has been out of his narcotic pain medications.  On chronic morphine and Norco.  ED Course  Labs/Imaging on admission: I have personally reviewed following labs and imaging studies.  Initial vitals showed BP 140/82, pulse 103, RR 18, temp 97.8 F, SpO2 99% on room air.  Labs show sodium 141, potassium 2.8, magnesium 1.6, bicarb 24, BUN 10, creatinine 1.17, serum glucose 131, calcium 8.3, albumin 3.8, LFTs within normal limits, WBC 6.6, hemoglobin 12.8, platelets 247,000.  TSH  0.250.  Blood cultures in process.  Patient was given IV K 10 mEq x 2, oral K 40 mEq x 1, IV magnesium 2 g.  The hospitalist service was consulted to admit for further evaluation and management.  Review of Systems: All systems reviewed and are negative except as documented in history of present illness above.   Past Medical History:  Diagnosis Date   Anemia    Arthritis    BPH (benign prostatic hyperplasia)    CHF (congestive heart failure) (HCC)    Chronic pain    Diabetes mellitus    Dyspnea    GERD (gastroesophageal reflux disease)    Headache(784.0)    migraines, sinus headaches   Hepatitis    C and B-treated   HLD (hyperlipidemia)    Hypertension    Leukemia ([place])    Lupus ([place])    Multiple myeloma ([place])    Pneumonia 07/2011   Sleep apnea 03/2018   going for fitting on 04-29-18   Thyroid disease    goiter   Urinary frequency     Past Surgical History:  Procedure Laterality Date   ABDOMINAL SURGERY     BACK SURGERY     x 5    BIOPSY  01/02/2020   Procedure: BIOPSY;  Surgeon: Carol Ada, MD;  Location: [facility];  Service: Endoscopy;;   CHOLECYSTECTOMY     COLONOSCOPY     COLONOSCOPY WITH PROPOFOL N/A 01/02/2020   Procedure: COLONOSCOPY WITH PROPOFOL;  Surgeon: Carol Ada, MD;  Location: [facility];  Service: Endoscopy;  Laterality:  N/A;   cyst removal skull  20 years ago   ETHMOIDECTOMY  10/12/2011   Procedure: ETHMOIDECTOMY;  Surgeon: Thornell Sartorius, MD;  Location: [facility];  Service: ENT;  Laterality: Bilateral;  bilateral maxillary sinus osteal enlargement, frontal sinusotomy   EYE SURGERY     bilat cataract with lens implants   HAND SURGERY     right finger   HERNIA REPAIR     MICROLARYNGOSCOPY N/A 02/18/2021   Procedure: MICROLARYNGOSCOPY with Excisional Biopsy;  Surgeon: Jerrell Belfast, MD;  Location: [facility];  Service: ENT;  Laterality: N/A;   POLYPECTOMY  01/02/2020   Procedure: POLYPECTOMY;  Surgeon: Carol Ada, MD;  Location: [facility];   Service: Endoscopy;;   ROTATOR CUFF REPAIR     bilateral   SHOULDER ARTHROSCOPY WITH ROTATOR CUFF REPAIR Left 05/18/2014   Procedure: SHOULDER ARTHROSCOPY WITH ROTATOR CUFF REPAIR;  Surgeon: Nita Sells, MD;  Location: [facility];  Service: Orthopedics;  Laterality: Left;  Left shoulder arthroscopy rotator cuff repair, subacromial decompression   sinus surgery     TONSILLECTOMY     TRIGGER FINGER RELEASE Right 05/02/2018   Procedure: RELEASE TRIGGER FINGER/A-1 PULLEY RIGHT SMALL FINGER;  Surgeon: Leanora Cover, MD;  Location: [facility];  Service: Orthopedics;  Laterality: Right;    Social History:  reports that he quit smoking about 38 years ago. His smoking use included cigarettes. He has a 26.00 pack-year smoking history. He has never used smokeless tobacco. He reports that he does not currently use drugs after having used the following drugs: Heroin. He reports that he does not drink alcohol.  Allergies  Allergen Reactions   Invokana [Canagliflozin] Anaphylaxis    Facial/neck/ lip swelling   Ace Inhibitors Cough    Family History  Problem Relation Age of Onset   Hyperlipidemia Mother    Hypertension Mother      Prior to Admission medications   Medication Sig Start Date End Date Taking? Authorizing Provider  amLODipine (NORVASC) 10 MG tablet Take 10 mg by mouth every morning.     [provider]  APIXABAN Arne Cleveland) VTE STARTER PACK ('10MG'$  AND '5MG'$ ) Take as directed on package: start with 2 tablets (10 mg total) twice daily for 7 days. On day 8, switch to 1 tablet (5 mg total) twice daily. 08/24/22   Barb Merino, MD  bismuth subsalicylate (PEPTO BISMOL) 262 MG/15ML suspension Take 30 mLs by mouth every 6 (six) hours as needed for indigestion or diarrhea or loose stools.    [provider]  Calcium Carbonate-Vitamin D (CALCIUM 600+D PO) Take 1 tablet by mouth in the morning and at bedtime.    [provider]   colestipol (COLESTID) 1 g tablet Take 1 g by mouth 2 (two) times daily. 02/11/20   [provider]  dexamethasone (DECADRON) 4 MG tablet TAKE 5 TABLETS BY MOUTH ONCE A WEEK 04/17/22   Wyatt Portela, MD  diphenhydrAMINE HCl (ZZZQUIL) 50 MG/30ML LIQD Take 30 mLs by mouth at bedtime as needed (sleep).    [provider]  Ferrous Fumarate (HEMOCYTE - 106 MG FE) 324 (106 Fe) MG TABS tablet Take 1 tablet by mouth 2 (two) times daily. 02/15/21   [provider]  finasteride (PROPECIA) 1 MG tablet Take 1 mg by mouth in the morning. 02/16/20   [provider]  Fluticasone-Umeclidin-Vilant (TRELEGY ELLIPTA) 100-62.5-25 MCG/ACT AEPB Inhale 1 puff into the lungs daily.    [provider]  furosemide (LASIX) 20 MG  tablet TAKE 1 TABLET BY MOUTH EVERY DAY 08/08/22   Maryjane Hurter, MD  gabapentin (NEURONTIN) 400 MG capsule Take 400-800 mg by mouth See admin instructions. Take '400mg'$  at lunch time and  2 Capsules (800 mg) at bedtime 08/26/15   [provider]  HYDROcodone-acetaminophen (NORCO) 10-325 MG tablet Take 1 tablet by mouth in the morning, at noon, and at bedtime. 09/16/16   [provider]  insulin degludec (TRESIBA FLEXTOUCH) 200 UNIT/ML FlexTouch Pen Inject 30 Units into the skin daily after supper.    [provider]  lidocaine (LIDODERM) 5 % Place 1 patch onto the skin daily. Remove & Discard patch within 12 hours or as directed by MD 01/02/22   Curatolo, Adam, DO  losartan (COZAAR) 50 MG tablet Take 50 mg by mouth every morning.     [provider]  pantoprazole (PROTONIX) 40 MG tablet Take 40 mg by mouth 2 (two) times daily. 12/14/20   [provider]  pioglitazone (ACTOS) 15 MG tablet Take 15 mg by mouth in the morning. 01/13/21   [provider]  potassium chloride SA (KLOR-CON M) 20 MEQ tablet Take 1 tablet (20 mEq total) by mouth 2 (two) times daily for 3 days. 08/26/22 08/29/22  Barb Merino, MD   RESTASIS 0.05 % ophthalmic emulsion Place 1 drop into both eyes 2 (two) times daily as needed for dry eyes. 11/27/19   [provider]  Tamsulosin HCl (FLOMAX) 0.4 MG CAPS Take 0.4 mg by mouth 2 (two) times daily.     [provider]  vitamin B-12 (CYANOCOBALAMIN) 1000 MCG tablet Take 1,000 mcg by mouth in the morning and at bedtime.    [provider]  terazosin (HYTRIN) 5 MG capsule Take 5 mg by mouth 2 (two) times daily.   10/25/11  [provider]    Physical Exam: Vitals:   08/30/22 2006 08/30/22 2100 08/30/22 2134 08/30/22 2200  BP:  (!) 156/67 (!) 180/75 (!) 159/74  Pulse: 84 83 88 85  Resp: 19 (!) 27 (!) 24 (!) 27  Temp:   98.2 F (36.8 C)   TempSrc:   Oral   SpO2: 100% 100% 100% 100%   Constitutional: Resting in bed in the right lateral decubitus position.  NAD, calm. Eyes: EOMI, lids and conjunctivae normal ENMT: Mucous membranes are dry. Posterior pharynx clear of any exudate or lesions.Normal dentition.  Neck: normal, supple, no masses. Respiratory: clear to auscultation bilaterally, no wheezing, no crackles. Normal respiratory effort. No accessory muscle use.  Cardiovascular: Regular rate and rhythm, no murmurs / rubs / gallops. No extremity edema. 2+ pedal pulses. Abdomen: Epigastric tenderness, no masses palpated. No hepatosplenomegaly. Bowel sounds positive.  Musculoskeletal: no clubbing / cyanosis. No joint deformity upper and lower extremities. Good ROM, no contractures. Normal muscle tone.  Skin: no rashes, lesions, ulcers. No induration Neurologic: Sensation intact. Strength 5/5 in all 4.  Psychiatric: Alert and oriented x 3. Normal mood.   EKG: Personally reviewed. Sinus rhythm, rate 95, RBBB and LAFB.  No acute ischemic changes.  Similar to prior.  Assessment/Plan Principal Problem:   Hypokalemia Active Problems:   Acute pulmonary embolism (HCC)   Chronic diastolic CHF (congestive heart failure) (HCC)   COPD (chronic  obstructive pulmonary disease) (HCC)   Type 2 diabetes mellitus with complication, with long-term current use of insulin (HCC)   Chronic diarrhea   Multiple myeloma (HCC)   Chronic back pain   BPH (benign prostatic hyperplasia)   Essential hypertension  Hypomagnesemia   James [name]. is a 77 y.o. male with medical history significant for multiple myeloma in remission, COPD with ILD, T2DM, HTN, HLD, BPH, chronic diarrhea, chronic back pain on chronic narcotics, and recent diagnosis of PE who is admitted with hypokalemia and hypomagnesemia in setting of acute on chronic diarrhea.  Assessment and Plan: Hypokalemia/hypomagnesemia: In setting of GI losses.  Oral and IV supplementation initiated.  Hold Lasix and repeat labs in AM.  Acute on chronic diarrhea: Has been on Colestid for chronic diarrhea, now presenting with increased frequency of watery stools.  He states he has been out of his chronic pain medications and given associated symptoms of restlessness and insomnia raises the question of opioid withdrawal. -Follow GI pathogen and C. difficile panels -Restart home MS Contin 30 mg q12h and Norco 10-325 mg q6h prn  Suppressed TSH/thyroid nodule: TSH 0.250, unclear clinical significance.  Recent thyroid ultrasound showed ill-defined thyroid nodule measuring up to 2.1 cm at the inferior aspect of the left thyroid gland.  Follow-up ultrasound in 1 year was recommended. -Check T4 and T3  Recent PE: Continue Eliquis.  Multiple myeloma: Previously on Revlimid which was discontinued recently by oncology due to PE and remission status.  Scheduled for follow-up with Dr. Lorenso Courier on 09/07/2022.  Chronic back pain: Due to multilevel vertebral compression fractures in setting of multiple myeloma.  Per patient, he has not had his chronic narcotic medications for last few days. -Continue MS Contin 30 mg q12h -Continue Norco 10-325 mg q6h prn -Continue gabapentin  COPD/ILD: Stable, no  wheezing on admission.  Continue Trelegy Ellipta and albuterol as needed.  T2DM: Continue home Tyler Aas, add SSI in hospital.  HFpEF: Appears euvolemic.  EF 60-65% by TTE 08/23/2022.  Holding Lasix with hypokalemia and hypomagnesemia.  Hypertension: Continue losartan and amlodipine.  BPH: Continue Proscar and Flomax.   DVT prophylaxis:  apixaban (ELIQUIS) tablet 10 mg  apixaban (ELIQUIS) tablet 5 mg   Code Status: Full code, confirmed with patient on admission Family Communication: Spouse at bedside Disposition Plan: From home and likely discharge to home pending clinical progress Consults called: None Severity of Illness: The appropriate patient status for this patient is OBSERVATION. Observation status is judged to be reasonable and necessary in order to provide the required intensity of service to ensure the patient's safety. The patient's presenting symptoms, physical exam findings, and initial radiographic and laboratory data in the context of their medical condition is felt to place them at decreased risk for further clinical deterioration. Furthermore, it is anticipated that the patient will be medically stable for discharge from the hospital within 2 midnights of admission.   Zada Finders MD [facility]  If 7PM-7AM, please contact night-coverage [email or web address]  08/31/2022, 1:12 AM

## 2022-08-31 NOTE — Progress Notes (Signed)
Mobility Specialist - Progress Note   08/31/22 1052  Mobility  Activity Ambulated independently in hallway  Level of Assistance Independent  Assistive Device None  Distance Ambulated (ft) 250 ft  Activity Response Tolerated well  Mobility Referral Yes  $Mobility charge 1 Mobility   Pt received in bed and agreeable to mobility. No complaints during session. Pt to EOB after session with all needs met & wife in room.  [facility]

## 2022-08-31 NOTE — Plan of Care (Signed)
  Problem: Education: Goal: Ability to describe self-care measures that may prevent or decrease complications (Diabetes Survival Skills Education) will improve Outcome: Progressing   Problem: Fluid Volume: Goal: Ability to maintain a balanced intake and output will improve Outcome: Progressing   Problem: Skin Integrity: Goal: Risk for impaired skin integrity will decrease Outcome: Progressing   Problem: Clinical Measurements: Goal: Ability to maintain clinical measurements within normal limits will improve Outcome: Progressing

## 2022-08-31 NOTE — Progress Notes (Signed)
PROGRESS NOTE  [name].  JGG:[id number] DOB: 10-02-1945 DOA: 08/30/2022 PCP: Ashley Akin, NP   Brief Narrative: Patient is a 77 year old male with history of multiple myeloma in remission, COPD/ILD, type 2 diabetes, hypertension, hyperlipidemia, BPH, chronic diarrhea, chronic back pain on chronic narcotics, recent diagnosis of PE who presented to emergency department from home with frequent diarrhea, chills.  Recently admitted here from 1/24 - 1/27 when he was managed for acute right upper lobe anterior segmental, subsegmental pulmonary emboli with small clot burden, with no right heart strain.  Patient was discharged on Eliquis.  During last 3 days, he was having frequent diarrhea, intermittent abdominal pain with no nausea or vomiting.  On presentation ,he was hemodynamically stable.  Lab work showed potassium of 2.8, magnesium 1.6.  Assessment & Plan:  Principal Problem:   Hypokalemia Active Problems:   Acute pulmonary embolism (HCC)   Chronic diastolic CHF (congestive heart failure) (HCC)   COPD (chronic obstructive pulmonary disease) (HCC)   Type 2 diabetes mellitus with complication, with long-term current use of insulin (HCC)   Chronic diarrhea   Multiple myeloma (HCC)   Chronic back pain   BPH (benign prostatic hyperplasia)   Essential hypertension   Hypomagnesemia   Acute on chronic diarrhea with electrolyte abnormalities: Presented with frequent diarrhea, intermittent abdominal pain without nausea or vomiting.  Lab work showed hypokalemia, hypomagnesemia likely associated  with GI loss.  Currently being monitored and supplemented as needed. Has history of chronic diarrhea, on Colestid for that.  Patient also said he was out of his chronic pain medications so opiate withdrawal is also a possibility which can cause diarrhea. C. difficile panel negative, GI pathogen panel pending.  Recent history of PE: Denies any chest pain or shortness of breath.  Diagnosed on  last admission and was started on Eliquis but patient apparently was not taking it until yesterday  History of multiple myeloma: Previously on Revlimid which was discontinued recently by oncology due to PE.  Previously following with Dr. Alen Blew but now will follow up with Dr. Lorenso Courier, next appointment on 09/07/2022  Chronic back pain/chronic pain syndrome with opiate dependence: History of multiple vertebral compression fractures in the setting of multiple myeloma.  Takes MS Contin 30 mg every 12, Norco, gabapentin at home.  He said he ran out of these medications.  Restarted.  Suppressed TSH/history of thyroid nodule: TSH of 0.2.  Recent thyroid ultrasound on last admission  showed ill-defined thyroid nodule measuring up to 2.1 cm at the inferior aspect of the left thyroid gland.  Ultrasound follow-up in 1 year was recommended.  Free T4 is high, free T3 pending. I discussed the case with Dr. Buddy Duty, endocrinology today.  He wants to see him in the office.His office will set up appointment.  No need to start on any antithyroid medications like methimazole for now.  COPD/ILD: Currently on room air.  No wheezing.  Continue Trelegy Ellipta, albuterol as needed  Diabetes type 2: Tresiba at home.  Currently on sliding scale.  Monitor blood sugars  Hypertension: Currently blood pressure stable.  Continue losartan, amlodipine  Chronic diastolic CHF: Appears euvolemic.  Echo on 08/23/2022 showed EF of 60-65%.  Takes Lasix at home which is on hold due to hypokalemia, hypomagnesemia  BPH: Takes Proscar, Flomax  Insomnia: Requesting sleep medication        DVT prophylaxis: apixaban (ELIQUIS) tablet 10 mg  apixaban (ELIQUIS) tablet 5 mg     Code Status: Full Code  Family Communication:  Wife at bedside  Patient status:Obs  Patient is from :Home  Anticipated discharge WH:QPRF  Estimated DC date:tomorrow   Consultants: None  Procedures: None  Antimicrobials:  Anti-infectives (From  admission, onward)    None       Subjective: Patient seen and examined at bedside today.  Hemodynamically stable.  Comfortable, lying in bed.  Alert and oriented.  He said he just had 1 episode of diarrhea this morning.  He denies abdomen pain, nausea or vomiting.  Patient is very irritated because he could not sleep last night.  Had a long discussion at the bedside about his thyroid hormone findings, recent diagnosis of PE, appointment with Dr. Lorenso Courier.   Objective: Vitals:   08/31/22 0100 08/31/22 0200 08/31/22 0243 08/31/22 0537  BP:  (!) 155/75  (!) 141/72  Pulse:  84  74  Resp:  20  20  Temp:  98.9 F (37.2 C)  98.6 F (37 C)  TempSrc:  Oral  Oral  SpO2:  100%  99%  Weight: 89.1 kg     Height:   6' (1.829 m)     Intake/Output Summary (Last 24 hours) at 08/31/2022 0741 Last data filed at 08/31/2022 0500 Gross per 24 hour  Intake 54.17 ml  Output 350 ml  Net -295.83 ml   Filed Weights   08/31/22 0100  Weight: 89.1 kg    Examination:  General exam: Overall comfortable, not in distress HEENT: PERRL Respiratory system:  no wheezes or crackles  Cardiovascular system: S1 & S2 heard, RRR.  Gastrointestinal system: Abdomen is nondistended, soft and nontender. Central nervous system: Alert and oriented Extremities: No edema, no clubbing ,no cyanosis Skin: No rashes, no ulcers,no icterus     Data Reviewed: I have personally reviewed following labs and imaging studies  CBC: Recent Labs  Lab 08/25/22 0029 08/30/22 1747 08/31/22 0728  WBC 10.0 6.6 5.3  NEUTROABS 7.9* 4.0  --   HGB 14.4 12.8* 11.7*  HCT 42.0 38.4* 35.1*  MCV 91.9 93.7 95.1  PLT 226 247 163   Basic Metabolic Panel: Recent Labs  Lab 08/25/22 0029 08/26/22 0001 08/26/22 1154 08/30/22 1747 08/30/22 1753 08/31/22 0003  NA 138 137 137 141  --  142  K 3.3* 3.0* 3.4* 2.8*  --  3.0*  CL 105 106 107 109  --  112*  CO2 21* 20* 19* 24  --  21*  GLUCOSE 157* 128* 191* 131*  --  120*  BUN 30* 33* 22  10  --  9  CREATININE 1.99* 1.93* 1.51* 1.17  --  1.05  CALCIUM 8.4* 8.2* 8.4* 8.3*  --  8.0*  MG 2.1 2.0  --   --  1.6* 2.2  PHOS 2.7  --   --   --   --   --      Recent Results (from the past 240 hour(s))  Resp panel by RT-PCR (RSV, Flu A&B, Covid) Anterior Nasal Swab     Status: None   Collection Time: 08/23/22 12:22 AM   Specimen: Anterior Nasal Swab  Result Value Ref Range Status   SARS Coronavirus 2 by RT PCR NEGATIVE NEGATIVE Final    Comment: (NOTE) SARS-CoV-2 target nucleic acids are NOT DETECTED.  The SARS-CoV-2 RNA is generally detectable in upper respiratory specimens during the acute phase of infection. The lowest concentration of SARS-CoV-2 viral copies this assay can detect is 138 copies/mL. A negative result does not preclude SARS-Cov-2 infection and should not be used as the  sole basis for treatment or other patient management decisions. A negative result may occur with  improper specimen collection/handling, submission of specimen other than nasopharyngeal swab, presence of viral mutation(s) within the areas targeted by this assay, and inadequate number of viral copies(<138 copies/mL). A negative result must be combined with clinical observations, patient history, and epidemiological information. The expected result is Negative.  Fact Sheet for Patients:  EntrepreneurPulse.com.au  Fact Sheet for Healthcare Providers:  IncredibleEmployment.be  This test is no t yet approved or cleared by the [place] FDA and  has been authorized for detection and/or diagnosis of SARS-CoV-2 by FDA under an Emergency Use Authorization (EUA). This EUA will remain  in effect (meaning this test can be used) for the duration of the COVID-19 declaration under Section 564(b)(1) of the Act, 21 U.S.C.section 360bbb-3(b)(1), unless the authorization is terminated  or revoked sooner.       Influenza A by PCR NEGATIVE NEGATIVE Final    Influenza B by PCR NEGATIVE NEGATIVE Final    Comment: (NOTE) The Xpert Xpress SARS-CoV-2/FLU/RSV plus assay is intended as an aid in the diagnosis of influenza from Nasopharyngeal swab specimens and should not be used as a sole basis for treatment. Nasal washings and aspirates are unacceptable for Xpert Xpress SARS-CoV-2/FLU/RSV testing.  Fact Sheet for Patients: EntrepreneurPulse.com.au  Fact Sheet for Healthcare Providers: IncredibleEmployment.be  This test is not yet approved or cleared by the [place] FDA and has been authorized for detection and/or diagnosis of SARS-CoV-2 by FDA under an Emergency Use Authorization (EUA). This EUA will remain in effect (meaning this test can be used) for the duration of the COVID-19 declaration under Section 564(b)(1) of the Act, 21 U.S.C. section 360bbb-3(b)(1), unless the authorization is terminated or revoked.     Resp Syncytial Virus by PCR NEGATIVE NEGATIVE Final    Comment: (NOTE) Fact Sheet for Patients: EntrepreneurPulse.com.au  Fact Sheet for Healthcare Providers: IncredibleEmployment.be  This test is not yet approved or cleared by the [place] FDA and has been authorized for detection and/or diagnosis of SARS-CoV-2 by FDA under an Emergency Use Authorization (EUA). This EUA will remain in effect (meaning this test can be used) for the duration of the COVID-19 declaration under Section 564(b)(1) of the Act, 21 U.S.C. section 360bbb-3(b)(1), unless the authorization is terminated or revoked.  Performed at [facility], [place] [address] [place], [place] [address]   MRSA Next Gen by PCR, Nasal     Status: None   Collection Time: 08/23/22 11:56 PM   Specimen: Nasal Mucosa; Nasal Swab  Result Value Ref Range Status   MRSA by PCR Next Gen NOT DETECTED NOT DETECTED Final    Comment: (NOTE) The GeneXpert MRSA Assay (FDA approved for NASAL  specimens only), is one component of a comprehensive MRSA colonization surveillance program. It is not intended to diagnose MRSA infection nor to guide or monitor treatment for MRSA infections. Test performance is not FDA approved in patients less than 79 years old. Performed at [facility], [place] [address] [place], [place] [address]   Culture, blood (routine x 2)     Status: None (Preliminary result)   Collection Time: 08/30/22  5:47 PM   Specimen: BLOOD  Result Value Ref Range Status   Specimen Description   Final    BLOOD BLOOD RIGHT FOREARM Performed at [facility] [address], [place], [place] [address]    Special Requests   Final    BOTTLES DRAWN  AEROBIC AND ANAEROBIC Blood Culture adequate volume Performed at [facility] [address], [place], [place] [address]    Culture   Final    NO GROWTH < 12 HOURS Performed at [facility] [address], [place], [place] [address]    Report Status PENDING  Incomplete  Culture, blood (routine x 2)     Status: None (Preliminary result)   Collection Time: 08/30/22  6:00 PM   Specimen: BLOOD  Result Value Ref Range Status   Specimen Description   Final    BLOOD LEFT ANTECUBITAL Performed at [facility] [address], [place], [place] [address]    Special Requests   Final    BOTTLES DRAWN AEROBIC AND ANAEROBIC Blood Culture adequate volume Performed at [facility] [address] [place], [place] [address]    Culture   Final    NO GROWTH < 12 HOURS Performed at [facility] [address], [place], [place] [address]    Report Status PENDING  Incomplete  C Difficile Quick Screen w PCR reflex     Status: None   Collection Time: 08/31/22 12:51 AM   Specimen: STOOL  Result Value Ref Range Status   C Diff antigen NEGATIVE NEGATIVE Final   C Diff toxin NEGATIVE NEGATIVE Final   C Diff interpretation No C. difficile  detected.  Final    Comment: Performed at [facility], [place] [address], [place], [place] [address]     Radiology Studies: No results found.  Scheduled Meds:  amLODipine  10 mg Oral q morning   apixaban  10 mg Oral BID   Followed by   Derrill Memo ON 09/05/2022] apixaban  5 mg Oral BID   finasteride  5 mg Oral Daily   fluticasone furoate-vilanterol  1 puff Inhalation Daily   And   umeclidinium bromide  1 puff Inhalation Daily   gabapentin  400 mg Oral Q lunch   gabapentin  800 mg Oral QHS   insulin aspart  0-9 Units Subcutaneous TID WC   losartan  50 mg Oral q morning   morphine  30 mg Oral Q12H   pantoprazole  40 mg Oral BID   potassium chloride  10 mEq Oral Daily   sodium chloride flush  3 mL Intravenous Q12H   tamsulosin  0.4 mg Oral BID   Continuous Infusions:   LOS: 0 days   Shelly Coss, MD [facility] P2/07/2022, 7:41 AM

## 2022-08-31 NOTE — TOC Initial Note (Addendum)
Transition of Care [facility]) - Initial/Assessment Note    Patient Details  Name: James [name]. MRN: [id number] Date of Birth: 1945/12/10  [facility]) CM/SW Contact:    Dessa Phi, RN Phone Number: 08/31/2022, 11:40 AM  Clinical Narrative: Will confirm if active w/[facility] HHPT-rep [place]-await outcome. -1:24p-confirmed   Active w/[facility]-rep [facility] HHRN/PT.                 Expected Discharge Plan: [place]     Patient Goals and CMS Choice            Expected Discharge Plan and Services                                              Prior Living Arrangements/Services                       Activities of Daily Living Home Assistive Devices/Equipment: Cane (specify quad or straight) ADL Screening (condition at time of admission) Patient's cognitive ability adequate to safely complete daily activities?: Yes Is the patient deaf or have difficulty hearing?: No Does the patient have difficulty seeing, even when wearing glasses/contacts?: No Does the patient have difficulty concentrating, remembering, or making decisions?: Yes Patient able to express need for assistance with ADLs?: No Does the patient have difficulty dressing or bathing?: No Independently performs ADLs?: No Communication: Independent Does the patient have difficulty walking or climbing stairs?: Yes Weakness of Legs: Both Weakness of Arms/Hands: None  Permission Sought/Granted                  Emotional Assessment              Admission diagnosis:  Hypokalemia [E87.6] Hypomagnesemia [E83.42] Diarrhea, unspecified type [R19.7] Patient Active Problem List   Diagnosis Date Noted   Hypokalemia 08/30/2022   Hypomagnesemia 08/30/2022   Acute pulmonary embolism ([place]) 08/23/2022   Hypertensive urgency 08/23/2022   Elevated troponin 08/23/2022   Chronic diastolic CHF (congestive heart failure) ([place]) 08/23/2022   COPD (chronic obstructive  pulmonary disease) ([place]) 08/23/2022   Prolonged QT interval 08/23/2022   Type 2 diabetes mellitus with complication, with long-term current use of insulin ([place]) 08/23/2022   Chronic diarrhea 08/23/2022   Chronic back pain 08/23/2022   Compression fracture of body of thoracic vertebra ([place]) 08/23/2022   Compression fracture of lumbar vertebra ([place]) 08/23/2022   BPH (benign prostatic hyperplasia) 08/23/2022   Hoarseness, chronic 02/18/2021   Coronary artery disease involving native coronary artery of native heart without angina pectoris 10/09/2018   RBBB 10/09/2018   Essential hypertension 10/09/2018   Multiple myeloma ([place]) 01/14/2016   Plasma cell disorder    PCP:  Ashley Akin, NP Pharmacy:   [id number] [place]Phone: [phone]: [phone] [facility] [place] [place] [address]Phone: [phone]: [phone] [place]Phone: [phone]: [phone] [facility] [address] [place]Phone: [phone]: [phone]    Social Determinants of Health (SDOH) Social History: SDOH Screenings  Food Insecurity: No Food Insecurity (08/31/2022)  Housing: Low Risk  (08/31/2022)  Transportation Needs: No Transportation Needs (08/31/2022)  Utilities: Not At Risk (08/31/2022)  Tobacco Use: Medium Risk (08/31/2022)   SDOH Interventions:     Readmission Risk Interventions     No data to display

## 2022-09-01 ENCOUNTER — Other Ambulatory Visit (HOSPITAL_COMMUNITY): Payer: Self-pay

## 2022-09-01 DIAGNOSIS — E876 Hypokalemia: Secondary | ICD-10-CM | POA: Diagnosis not present

## 2022-09-01 LAB — BASIC METABOLIC PANEL
Anion gap: 6 (ref 5–15)
BUN: 8 mg/dL (ref 8–23)
CO2: 26 mmol/L (ref 22–32)
Calcium: 8.2 mg/dL — ABNORMAL LOW (ref 8.9–10.3)
Chloride: 108 mmol/L (ref 98–111)
Creatinine, Ser: 1.23 mg/dL (ref 0.61–1.24)
GFR, Estimated: >60 mL/min (ref >60)
Glucose, Bld: 120 mg/dL — ABNORMAL HIGH (ref 70–99)
Potassium: 3.4 mmol/L — ABNORMAL LOW (ref 3.5–5.1)
Sodium: 140 mmol/L (ref 135–145)

## 2022-09-01 LAB — GLUCOSE, CAPILLARY
Glucose-Capillary: 120 mg/dL — ABNORMAL HIGH (ref 70–99)
Glucose-Capillary: 131 mg/dL — ABNORMAL HIGH (ref 70–99)

## 2022-09-01 MED ORDER — ZOLPIDEM TARTRATE 5 MG PO TABS
5.0000 mg | ORAL_TABLET | Freq: Every evening | ORAL | 0 refills | Status: DC | PRN
  Filled 2022-09-01: qty 5, 5d supply, fill #0

## 2022-09-01 MED ORDER — TRELEGY ELLIPTA 200-62.5-25 MCG/ACT IN AEPB
1.0000 | INHALATION_SPRAY | Freq: Two times a day (BID) | RESPIRATORY_TRACT | 0 refills | Status: AC
  Filled 2022-09-01 (×2): qty 60, 30d supply, fill #0

## 2022-09-01 MED ORDER — AMLODIPINE BESYLATE 10 MG PO TABS
10.0000 mg | ORAL_TABLET | Freq: Every morning | ORAL | 0 refills | Status: AC
  Filled 2022-09-01: qty 30, 30d supply, fill #0

## 2022-09-01 MED ORDER — LOSARTAN POTASSIUM 50 MG PO TABS
50.0000 mg | ORAL_TABLET | Freq: Every morning | ORAL | 0 refills | Status: AC
  Filled 2022-09-01 (×2): qty 30, 30d supply, fill #0

## 2022-09-01 MED ORDER — APIXABAN (ELIQUIS) VTE STARTER PACK (10MG AND 5MG): ORAL_TABLET | ORAL | 0 refills | Status: DC

## 2022-09-01 MED ORDER — POTASSIUM CHLORIDE CRYS ER 20 MEQ PO TBCR
20.0000 meq | EXTENDED_RELEASE_TABLET | Freq: Every day | ORAL | 0 refills | Status: DC
  Filled 2022-09-01: qty 30, 30d supply, fill #0

## 2022-09-01 MED ORDER — LOPERAMIDE HCL 2 MG PO TABS
2.0000 mg | ORAL_TABLET | Freq: Three times a day (TID) | ORAL | 0 refills | Status: DC | PRN
  Filled 2022-09-01: qty 20, 7d supply, fill #0

## 2022-09-01 MED ORDER — POTASSIUM CHLORIDE CRYS ER 20 MEQ PO TBCR: 20.0000 meq | EXTENDED_RELEASE_TABLET | Freq: Every day | ORAL | Status: DC

## 2022-09-01 MED ORDER — TIZANIDINE HCL 4 MG PO TABS: 4.0000 mg | ORAL_TABLET | Freq: Three times a day (TID) | ORAL | 0 refills | Status: DC | PRN

## 2022-09-01 MED ORDER — POTASSIUM CHLORIDE CRYS ER 20 MEQ PO TBCR
40.0000 meq | EXTENDED_RELEASE_TABLET | Freq: Once | ORAL | Status: AC
  Administered 2022-09-01: 40 meq via ORAL
  Filled 2022-09-01: qty 2

## 2022-09-01 NOTE — Progress Notes (Signed)
Patient to be discharged to home this afternoon. All discharge instructions reviewed with the Patient including all discharge Medications and schedules for these Medications. Understanding verbalized and discharge AVS with the Patient at time of discharge. Medications delivered from [place] and with the Patient at time of discharge

## 2022-09-01 NOTE — Progress Notes (Signed)
Mobility Specialist - Progress Note   09/01/22 1029  Mobility  Activity Ambulated independently in hallway  Level of Assistance Independent  Assistive Device None  Distance Ambulated (ft) 500 ft  Range of Motion/Exercises Active  Activity Response Tolerated well  Mobility Referral Yes  $Mobility charge 1 Mobility   Pt was found in bed and agreeable to ambulate. Had no complaints and at EOS returned to sit EOB with all necessities in reach. Wife in room.  Ferd Hibbs Mobility Specialist

## 2022-09-01 NOTE — TOC Transition Note (Signed)
Transition of Care [facility]) - CM/SW Discharge Note   Patient Details  Name: James [name]. MRN: [id number] Date of Birth: 02-15-1946  [facility] Care [facility]) CM/SW Contact:  Dessa Phi, RN Phone Number: 09/01/2022, 9:50 AM   Clinical Narrative: d/c today Active [facility] HHRN/PT. Has own transport home.      Final next level of care: [place] Barriers to Discharge: No Barriers Identified   Patient Goals and CMS Choice      Discharge Placement                         Discharge Plan and Services Additional resources added to the After Visit Summary for                            HH Arranged: RN, PT [facility] Agency: [place] Date [facility] Agency Contacted: 09/01/22 [place]: [phone] Representative spoke with at [place]: [facility] Determinants of Health ([place]) Interventions SDOH Screenings   Food Insecurity: No Food Insecurity (08/31/2022)  Housing: Low Risk  (08/31/2022)  Transportation Needs: No Transportation Needs (08/31/2022)  Utilities: Not At Risk (08/31/2022)  Tobacco Use: Medium Risk (08/31/2022)     Readmission Risk Interventions     No data to display

## 2022-09-01 NOTE — Discharge Summary (Signed)
Physician Discharge Summary  [name]. FMB:[id number] DOB: 1945/12/05 DOA: 08/30/2022  PCP: Ashley Akin, NP  Admit date: 08/30/2022 Discharge date: 09/01/2022  Admitted From: Home Disposition:  Home  Discharge Condition:Stable CODE STATUS:FULL Diet recommendation: Heart Healthy  Brief/Interim Summary: Patient is a 77 year old male with history of multiple myeloma in remission, COPD/ILD, type 2 diabetes, hypertension, hyperlipidemia, BPH, chronic diarrhea, chronic back pain on chronic narcotics, recent diagnosis of PE who presented to emergency department from home with frequent diarrhea, chills.  Recently admitted here from 1/24 - 1/27 when he was managed for acute right upper lobe anterior segmental, subsegmental pulmonary emboli with small clot burden, with no right heart strain.  Patient was discharged on Eliquis.  During last 3 days, he was having frequent diarrhea, intermittent abdominal pain with no nausea or vomiting.  On presentation ,he was hemodynamically stable.  Lab work showed potassium of 2.8, magnesium 1.6.  His diarrhea is chronic and it is baseline.  He denies any abdomen pain, nausea or vomiting today.  GI pathogen panel, C. difficile negative.  Medically stable for discharge home  Following problems were addressed during the hospitalization:  Acute on chronic diarrhea with electrolyte abnormalities: Presented with frequent diarrhea, intermittent abdominal pain without nausea or vomiting.  Lab work showed hypokalemia, hypomagnesemia likely associated  with GI loss.  Has history of chronic diarrhea, on Colestid for that.  Patient also said he was out of his chronic pain medications so opiate withdrawal is also a possibility which can cause diarrhea. C. difficile panel/GI pathogen panel negative.  Added Imodium. Continue potassium supplementation at home   Recent history of PE: Denies any chest pain or shortness of breath.  Diagnosed on last admission and was started  on Eliquis .   History of multiple myeloma: Previously on Revlimid which was discontinued recently by oncology due to PE.  Previously following with Dr. Alen Blew but now will follow up with Dr. 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  Takes MS Contin 30 mg every 12, Norco, gabapentin at home.  He follows with pain management provider at [facility] and says he will prescribe these medications   suppressed TSH/history of thyroid nodule: TSH of 0.2.  Recent thyroid ultrasound on last admission  showed ill-defined thyroid nodule measuring up to 2.1 cm at the inferior aspect of the left thyroid gland.  Ultrasound follow-up in 1 year was recommended.  Free T4 is high, free T3 pending. I discussed the case with Dr. Buddy Duty, endocrinology today.  He wants to see him in the office.His office will set up appointment.  No need to start on any antithyroid medications like methimazole for now.   COPD/ILD: Currently on room air.  No wheezing.  Continue Trelegy Ellipta, albuterol as needed at home   Diabetes type 2: Tresiba at home.  Continue same  Hypertension: Currently blood pressure stable.  Continue losartan, amlodipine   Chronic diastolic CHF: Appears euvolemic.  Echo on 08/23/2022 showed EF of 60-65%.  Takes Lasix at home.   BPH: Takes Proscar, Flomax   Insomnia: Requesting sleep medication, ordered 5 doses of Ambien for discharge   Discharge Diagnoses:  Principal Problem:   Hypokalemia Active Problems:   Acute pulmonary embolism (HCC)   Chronic diastolic CHF (congestive heart failure) (HCC)   COPD (chronic obstructive pulmonary disease) (HCC)   Type 2 diabetes mellitus with complication, with long-term  current use of insulin (HCC)   Chronic diarrhea   Multiple myeloma (HCC)   Chronic back pain   BPH (benign prostatic hyperplasia)   Essential  hypertension   Hypomagnesemia    Discharge Instructions  Discharge Instructions     Diet - low sodium heart healthy   Complete by: As directed    Discharge instructions   Complete by: As directed    1)Please take prescribed medications as instructed 2)Follow up with your PCP, pain management provider as an outpatient  3)Follow up with your oncologist.   Increase activity slowly   Complete by: As directed    No wound care   Complete by: As directed       Allergies as of 09/01/2022       Reactions   Invokana [canagliflozin] Anaphylaxis   Facial/neck/ lip swelling   Ace Inhibitors Cough        Medication List     TAKE these medications    albuterol 108 (90 Base) MCG/ACT inhaler Commonly known as: VENTOLIN HFA Inhale into the lungs.   amLODipine 10 MG tablet Commonly known as: NORVASC Take 1 tablet (10 mg total) by mouth every morning.   Apixaban Starter Pack ('10mg'$  and '5mg'$ ) Commonly known as: ELIQUIS STARTER PACK Take 2 tablets (10 mg total) twice daily for 4 days then continue taking 1 tablet ( 5 mg) twice daily What changed: additional instructions   bismuth subsalicylate 220 UR/42HC suspension Commonly known as: PEPTO BISMOL Take 30 mLs by mouth every 6 (six) hours as needed for indigestion or diarrhea or loose stools.   CALCIUM 600+D PO Take 1 tablet by mouth in the morning and at bedtime.   colestipol 1 g tablet Commonly known as: COLESTID Take 1 g by mouth 2 (two) times daily.   cyanocobalamin 1000 MCG tablet Commonly known as: VITAMIN B12 Take 1,000 mcg by mouth in the morning and at bedtime.   dexamethasone 4 MG tablet Commonly known as: DECADRON TAKE 5 TABLETS BY MOUTH ONCE A WEEK   Ferrous Fumarate 324 (106 Fe) MG Tabs tablet Commonly known as: HEMOCYTE - 106 mg FE Take 1 tablet by mouth 2 (two) times daily.   finasteride 5 MG tablet Commonly known as: PROSCAR Take 5 mg by mouth daily.   furosemide 20 MG tablet Commonly known as:  LASIX TAKE 1 TABLET BY MOUTH EVERY DAY   gabapentin 400 MG capsule Commonly known as: NEURONTIN Take 400-800 mg by mouth See admin instructions. Take '400mg'$  at lunch time and  2 Capsules (800 mg) at bedtime   HYDROcodone-acetaminophen 10-325 MG tablet Commonly known as: NORCO Take 1 tablet by mouth in the morning, at noon, and at bedtime.   lidocaine 5 % Commonly known as: Lidoderm Place 1 patch onto the skin daily. Remove & Discard patch within 12 hours or as directed by MD   loperamide 2 MG tablet Commonly known as: Imodium A-D Take 1 tablet (2 mg total) by mouth 3 (three) times daily as needed for diarrhea or loose stools.   losartan 50 MG tablet Commonly known as: COZAAR Take 1 tablet (50 mg total) by mouth every morning.   morphine 30 MG 12 hr tablet Commonly known as: MS CONTIN Take by mouth.   pantoprazole 40 MG tablet Commonly known as: PROTONIX Take 40 mg by mouth 2 (two) times daily.   pioglitazone 15 MG tablet Commonly known as: ACTOS Take 15 mg by mouth in the morning.   potassium chloride SA 20 MEQ  tablet Commonly known as: KLOR-CON M Take 1 tablet (20 mEq total) by mouth daily. Take 2 pills daily for 5 days then continue taking 1 pill daily Start taking on: September 02, 2022 What changed:  medication strength how much to take additional instructions Another medication with the same name was removed. Continue taking this medication, and follow the directions you see here.   Restasis 0.05 % ophthalmic emulsion Generic drug: cycloSPORINE Place 1 drop into both eyes 2 (two) times daily as needed for dry eyes.   tamsulosin 0.4 MG Caps capsule Commonly known as: FLOMAX Take 0.4 mg by mouth 2 (two) times daily.   tiZANidine 4 MG tablet Commonly known as: ZANAFLEX Take 1 tablet (4 mg total) by mouth every 8 (eight) hours as needed for muscle spasms. What changed:  when to take this reasons to take this   traMADol 50 MG tablet Commonly known as:  ULTRAM Take 50 mg by mouth every 6 (six) hours as needed for moderate pain or severe pain.   Trelegy Ellipta 200-62.5-25 MCG/ACT Aepb Generic drug: Fluticasone-Umeclidin-Vilant Take 1 puff by mouth 2 (two) times daily.   Tyler Aas FlexTouch 100 UNIT/ML FlexTouch Pen Generic drug: insulin degludec Inject into the skin daily.   zolpidem 5 MG tablet Commonly known as: AMBIEN Take 1 tablet (5 mg total) by mouth at bedtime as needed for sleep.   ZzzQuil 50 MG/30ML Liqd Generic drug: diphenhydrAMINE HCl (Sleep) Take 30 mLs by mouth at bedtime as needed (sleep).        Follow-up Information     [facility] Follow up.   Specialty: [place] Why: [facility] nursing/physical therapy Contact information: [place] [address] [phone]         Ashley Akin, NP. Schedule an appointment as soon as possible for a visit in 1 week(s).   Contact information: [place] [address] [phone]                Allergies  Allergen Reactions   Invokana [Canagliflozin] Anaphylaxis    Facial/neck/ lip swelling   Ace Inhibitors Cough    Consultations: None    Procedures/Studies: US THYROID  Result Date: 08/27/2022 CLINICAL DATA:  Incidental on CT. EXAM: THYROID ULTRASOUND TECHNIQUE: Ultrasound examination of the thyroid gland and adjacent soft tissues was performed. COMPARISON:  None Available. FINDINGS: Parenchymal Echotexture: Normal Isthmus: 1.0 cm Right lobe: 5.5 x 2.7 x 2.4 cm Left lobe: 5.5 x 2.6 x 2.7 cm _________________________________________________________ Estimated total number of nodules >/= 1 cm: 3 Number of spongiform nodules >/=  2 cm not described below (TR1): 0 Number of mixed cystic and solid nodules >/= 1.5 cm not described below (TR2): 0 _________________________________________________________ Nodule # 1: Circumscribed anechoic cyst in the right mid gland measures 1.1 x 0.8 x 1.0 cm.  This is considered sonographically benign. This nodule does NOT meet TI-RADS criteria for biopsy or dedicated follow-up. Nodule # 2: Anechoic simple cyst with peripheral colloid artifact in the right inferior gland measures up to 1.2 cm. This nodule does NOT meet TI-RADS criteria for biopsy or dedicated follow-up. Nodule # 3: Location: Left; Inferior Maximum size: 2.1 cm; Other 2 dimensions: 1.9 x 1.9 cm Composition: solid/almost completely solid (2) Echogenicity: isoechoic (1) Shape: not taller-than-wide (0) Margins: ill-defined (0) Echogenic foci: none (0) ACR TI-RADS total points: 3. ACR TI-RADS risk category: TR3 (3 points). ACR TI-RADS recommendations: *Given size (>/= 1.5 - 2.4 cm) and appearance, a  follow-up ultrasound in 1 year should be considered based on TI-RADS criteria. _________________________________________________________ IMPRESSION: 1. Ill-defined TI-RADS category 3 nodule versus pseudo nodule at the most inferior aspect of the left thyroid gland measures up to 2.1 cm. This nodule meets criteria for imaging surveillance. Recommend follow-up ultrasound in 1 year. The above is in keeping with the ACR TI-RADS recommendations - J Am Coll Radiol 2017;14:587-595. Electronically Signed   By: Jacqulynn Cadet M.D.   On: 08/27/2022 15:23   CT HEAD WO CONTRAST (5MM)  Result Date: 08/24/2022 CLINICAL DATA:  Mental status change, unknown cause recent trauma, new on Eliquis. rule out intracranial bleeding EXAM: CT HEAD WITHOUT CONTRAST TECHNIQUE: Contiguous axial images were obtained from the base of the skull through the vertex without intravenous contrast. RADIATION DOSE REDUCTION: This exam was performed according to the departmental dose-optimization program which includes automated exposure control, adjustment of the mA and/or kV according to patient size and/or use of iterative reconstruction technique. COMPARISON:  None Available. FINDINGS: Brain: No evidence of acute large vascular territory  infarction, hemorrhage, hydrocephalus, extra-axial collection or mass lesion/mass effect. Patchy white hypodensities, nonspecific but compatible with chronic microvascular ischemic disease. Vascular: No hyperdense vessel identified. Calcific atherosclerosis. Skull: No acute fracture. Sinuses/Orbits: Largely clear sinuses.  No acute orbital findings. Other: No mastoid effusions IMPRESSION: No evidence of acute intracranial abnormality. Electronically Signed   By: Margaretha Sheffield M.D.   On: 08/24/2022 12:29   VAS [place] LOWER EXTREMITY VENOUS (DVT)  Result Date: 08/23/2022  Lower Venous DVT Study Patient Name:  Dermot Gremillion.  Date of Exam:   08/23/2022 Medical Rec #: [id number]              Accession #:    [id number] Date of Birth: Aug 30, 1945              Patient Gender: M Patient Age:   25 years Exam Location:  [facility] Procedure:      VAS [place] LOWER EXTREMITY VENOUS (DVT) Referring Phys: Fuller Plan --------------------------------------------------------------------------------  Indications: Pulmonary embolism.  Limitations: Constant patient movement and poor ultrasound/tissue interface. Comparison Study: 10-26-2021 Prior left lower extremity venous study was                   negative for DVT. Performing Technologist: Darlin Coco RDMS, RVT  Examination Guidelines: A complete evaluation includes B-mode imaging, spectral Doppler, color Doppler, and power Doppler as needed of all accessible portions of each vessel. Bilateral testing is considered an integral part of a complete examination. Limited examinations for reoccurring indications may be performed as noted. The reflux portion of the exam is performed with the patient in reverse Trendelenburg.  +---------+---------------+---------+-----------+----------+-------------------+ RIGHT    CompressibilityPhasicitySpontaneityPropertiesThrombus Aging      +---------+---------------+---------+-----------+----------+-------------------+ CFV       Full           Yes      Yes                                      +---------+---------------+---------+-----------+----------+-------------------+ SFJ      Full                                                             +---------+---------------+---------+-----------+----------+-------------------+  FV Prox  Full                                                             +---------+---------------+---------+-----------+----------+-------------------+ FV Mid   Full                                                             +---------+---------------+---------+-----------+----------+-------------------+ FV DistalFull                                                             +---------+---------------+---------+-----------+----------+-------------------+ PFV      Full                                                             +---------+---------------+---------+-----------+----------+-------------------+ POP      Full           Yes      Yes                                      +---------+---------------+---------+-----------+----------+-------------------+ PTV      Full                                                             +---------+---------------+---------+-----------+----------+-------------------+ PERO                                                  Not well visualized +---------+---------------+---------+-----------+----------+-------------------+   +---------+---------------+---------+-----------+----------+-------------------+ LEFT     CompressibilityPhasicitySpontaneityPropertiesThrombus Aging      +---------+---------------+---------+-----------+----------+-------------------+ CFV      Full           Yes      Yes                                      +---------+---------------+---------+-----------+----------+-------------------+ SFJ      Full                                                              +---------+---------------+---------+-----------+----------+-------------------+ FV Prox  Full                                                             +---------+---------------+---------+-----------+----------+-------------------+  FV Mid   Full                                                             +---------+---------------+---------+-----------+----------+-------------------+ FV DistalFull                                                             +---------+---------------+---------+-----------+----------+-------------------+ PFV      Full                                                             +---------+---------------+---------+-----------+----------+-------------------+ POP      Full           Yes      Yes                                      +---------+---------------+---------+-----------+----------+-------------------+ PTV      Full                                                             +---------+---------------+---------+-----------+----------+-------------------+ PERO                                                  Not well visualized +---------+---------------+---------+-----------+----------+-------------------+     Summary: RIGHT: - There is no evidence of deep vein thrombosis in the lower extremity.  - No cystic structure found in the popliteal fossa.  LEFT: - There is no evidence of deep vein thrombosis in the lower extremity.  - No cystic structure found in the popliteal fossa.  *See table(s) above for measurements and observations. Electronically signed by Harold Barban MD on 08/23/2022 at 8:56:30 PM.    Final    ECHOCARDIOGRAM COMPLETE  Result Date: 08/23/2022    ECHOCARDIOGRAM REPORT   Patient Name:   Psalm Schappell. Date of Exam: 08/23/2022 Medical Rec #:  [id number]             Height:       72.0 in Accession #:    [id number]            Weight:       210.0 lb Date of Birth:  03-03-1946             BSA:          2.175  m Patient Age:    89 years              BP:           188/106  mmHg Patient Gender: M                     HR:           99 bpm. Exam Location:  Inpatient Procedure: 2D Echo, Cardiac Doppler, Color Doppler and Intracardiac            Opacification Agent Indications:    Pulmonary embolism  History:        Patient has prior history of Echocardiogram examinations, most                 recent 11/15/2021. CAD, Arrythmias:RBBB; Risk                 Factors:Hypertension and Former Smoker.  Sonographer:    Clayton Lefort RDCS (AE) Referring Phys: [id number] [facility] A SMITH  Sonographer Comments: Technically difficult study due to poor echo windows, suboptimal parasternal window, suboptimal apical window and no subcostal window. Image acquisition challenging due to uncooperative patient and Image acquisition challenging due to respiratory motion. IMPRESSIONS  1. Left ventricular ejection fraction, by estimation, is 60 to 65%. The left ventricle has normal function. The left ventricle has no regional wall motion abnormalities. Left ventricular diastolic parameters are indeterminate.  2. Right ventricular systolic function is normal. The right ventricular size is normal.  3. The mitral valve is abnormal. No evidence of mitral valve regurgitation. No evidence of mitral stenosis.  4. The aortic valve is normal in structure. There is mild calcification of the aortic valve. There is mild thickening of the aortic valve. Aortic valve regurgitation is not visualized. Aortic valve sclerosis is present, with no evidence of aortic valve stenosis.  5. The inferior vena cava is normal in size with greater than 50% respiratory variability, suggesting right atrial pressure of 3 mmHg. FINDINGS  Left Ventricle: Left ventricular ejection fraction, by estimation, is 60 to 65%. The left ventricle has normal function. The left ventricle has no regional wall motion abnormalities. Definity contrast agent was given IV to delineate the left ventricular   endocardial borders. The left ventricular internal cavity size was normal in size. There is no left ventricular hypertrophy. Left ventricular diastolic parameters are indeterminate. Right Ventricle: The right ventricular size is normal. No increase in right ventricular wall thickness. Right ventricular systolic function is normal. Left Atrium: Left atrial size was normal in size. Right Atrium: Right atrial size was normal in size. Pericardium: There is no evidence of pericardial effusion. Mitral Valve: The mitral valve is abnormal. There is mild thickening of the mitral valve leaflet(s). There is mild calcification of the mitral valve leaflet(s). No evidence of mitral valve regurgitation. No evidence of mitral valve stenosis. Tricuspid Valve: The tricuspid valve is normal in structure. Tricuspid valve regurgitation is not demonstrated. No evidence of tricuspid stenosis. Aortic Valve: The aortic valve is normal in structure. There is mild calcification of the aortic valve. There is mild thickening of the aortic valve. Aortic valve regurgitation is not visualized. Aortic valve sclerosis is present, with no evidence of aortic valve stenosis. Aortic valve mean gradient measures 3.0 mmHg. Aortic valve peak gradient measures 6.8 mmHg. Aortic valve area, by VTI measures 2.97 cm. Pulmonic Valve: The pulmonic valve was normal in structure. Pulmonic valve regurgitation is not visualized. No evidence of pulmonic stenosis. Aorta: The aortic root is normal in size and structure. Venous: The inferior vena cava is normal in size with greater than 50% respiratory variability, suggesting right atrial pressure of 3 mmHg. IAS/Shunts: The interatrial  septum was not well visualized.  LEFT VENTRICLE PLAX 2D LVIDd:         5.10 cm LVIDs:         3.70 cm LV PW:         1.10 cm LV IVS:        1.00 cm LVOT diam:     2.30 cm LV SV:         59 LV SV Index:   27 LVOT Area:     4.15 cm  RIGHT VENTRICLE RV S prime:     12.60 cm/s LEFT ATRIUM              Index LA diam:        3.90 cm 1.79 cm/m LA Vol (A2C):   34.0 ml 15.63 ml/m LA Vol (A4C):   43.0 ml 19.77 ml/m LA Biplane Vol: 38.9 ml 17.88 ml/m  AORTIC VALVE AV Area (Vmax):    2.90 cm AV Area (Vmean):   2.84 cm AV Area (VTI):     2.97 cm AV Vmax:           130.00 cm/s AV Vmean:          84.200 cm/s AV VTI:            0.197 m AV Peak Grad:      6.8 mmHg AV Mean Grad:      3.0 mmHg LVOT Vmax:         90.70 cm/s LVOT Vmean:        57.600 cm/s LVOT VTI:          0.141 m LVOT/AV VTI ratio: 0.72  AORTA Ao Root diam: 3.20 cm  SHUNTS Systemic VTI:  0.14 m Systemic Diam: 2.30 cm Jenkins Rouge MD Electronically signed by Jenkins Rouge MD Signature Date/Time: 08/23/2022/12:01:16 PM    Final    CT Angio Chest PE W and/or Wo Contrast  Result Date: 08/23/2022 CLINICAL DATA:  Syncope/presyncope, with hypertension and COPD. EXAM: CT ANGIOGRAPHY CHEST WITH CONTRAST TECHNIQUE: Multidetector CT imaging of the chest was performed using the standard protocol during bolus administration of intravenous contrast. Multiplanar CT image reconstructions and MIPs were obtained to evaluate the vascular anatomy. RADIATION DOSE REDUCTION: This exam was performed according to the departmental dose-optimization program which includes automated exposure control, adjustment of the mA and/or kV according to patient size and/or use of iterative reconstruction technique. CONTRAST:  47m OMNIPAQUE IOHEXOL 350 MG/ML SOLN COMPARISON:  Chest CT no contrast 08/19/2022, CTA chest 08/12/2022, chest CT no contrast 11/04/2021. Is okay extraocular today FINDINGS: Cardiovascular: There is an acute hypodense thrombus partially filling a right upper lobe anterior segmental artery on 5:51 and 52 and extending into vertically and anteriorly directed subsegmental divisions of the surgery. There is no arterial dilatation or further visible embolus. This is a small clot burden without visible right heart strain findings or IVC reflux. Stable  cardiomegaly. A small pericardial effusion again noted. There is three-vessel coronary artery calcific plaque, with aortic atherosclerosis and scattered calcifications in the great vessels. No aortic aneurysm, dissection or stenosis is seen. The descending segment is tortuous and also normal in caliber. The pulmonary veins are decompressed. Mediastinum/Nodes: A 2.3 cm hypodense nodule in the left lobe of the thyroid gland is again shown. Thyroid ultrasound recommended. The lower poles of the thyroid gland are prominent and otherwise unremarkable. Axillary spaces are clear. There is no intrathoracic adenopathy. Mild asymmetric elevation again noted right hemidiaphragm. The thoracic esophagus, thoracic trachea, and  main bronchi are unremarkable. Lungs/Pleura: Subpleural fibrosis with a basal gradient is again shown, with unchanged lower zonal ground-glass opacities and bronchiolectasis, scattered honeycombing in the bases. An oval noncalcified subfissural right middle lobe nodule again measures 10 x 0.6 mm, stable. No focal pneumonia is evident and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        General: Pt is alert, awake, not in acute distress Cardiovascular: RRR, S1/S2 +, no rubs, no gallops Respiratory: CTA bilaterally, no wheezing, no rhonchi Abdominal: Soft, NT, ND, bowel sounds  + Extremities: no edema, no cyanosis    The results of significant diagnostics from this hospitalization (including imaging, microbiology, ancillary and laboratory) are listed below for reference.     Microbiology: Recent Results (from the past 240 hour(s))  Resp panel by RT-PCR (RSV, Flu A&B, Covid) Anterior Nasal Swab     Status: None   Collection Time: 08/23/22 12:22 AM   Specimen: Anterior Nasal Swab  Result Value Ref Range Status   SARS Coronavirus 2 by RT PCR NEGATIVE NEGATIVE Final    Comment: (NOTE) SARS-CoV-2 target nucleic acids are NOT DETECTED.  The SARS-CoV-2 RNA is generally detectable in upper respiratory specimens during the acute phase of infection. The lowest concentration of SARS-CoV-2 viral copies this assay can detect is 138 copies/mL. A negative result does not preclude SARS-Cov-2 infection and should not be used as the sole basis for treatment or other patient management decisions. A negative result may occur with  improper specimen collection/handling, submission of specimen other than nasopharyngeal swab, presence of viral mutation(s) within the areas targeted by  this assay, and inadequate number of viral copies(<138 copies/mL). A negative result must be combined with clinical observations, patient history, and epidemiological information. The expected result is Negative.  Fact Sheet for Patients:  EntrepreneurPulse.com.au  Fact Sheet for Healthcare Providers:  IncredibleEmployment.be  This test is no t yet approved or cleared by the [place] FDA and  has been authorized for detection and/or diagnosis of SARS-CoV-2 by FDA under an Emergency Use Authorization (EUA). This EUA will remain  in effect (meaning this test can be used) for the duration of the COVID-19 declaration under Section 564(b)(1) of the Act, 21 U.S.C.section 360bbb-3(b)(1), unless the authorization is terminated  or revoked sooner.        Influenza A by PCR NEGATIVE NEGATIVE Final   Influenza B by PCR NEGATIVE NEGATIVE Final    Comment: (NOTE) The Xpert Xpress SARS-CoV-2/FLU/RSV plus assay is intended as an aid in the diagnosis of influenza from Nasopharyngeal swab specimens and should not be used as a sole basis for treatment. Nasal washings and aspirates are unacceptable for Xpert Xpress SARS-CoV-2/FLU/RSV testing.  Fact Sheet for Patients: EntrepreneurPulse.com.au  Fact Sheet for Healthcare Providers: IncredibleEmployment.be  This test is not yet approved or cleared by the [place] FDA and has been authorized for detection and/or diagnosis of SARS-CoV-2 by FDA under an Emergency Use Authorization (EUA). This EUA will remain in effect (meaning this test can be used) for the duration of the COVID-19 declaration under Section 564(b)(1) of the Act, 21 U.S.C. section 360bbb-3(b)(1), unless the authorization is terminated or revoked.     Resp Syncytial Virus by PCR NEGATIVE NEGATIVE Final    Comment: (NOTE) Fact Sheet for Patients: EntrepreneurPulse.com.au  Fact Sheet for Healthcare Providers: IncredibleEmployment.be  This test is not yet approved or cleared by the [place] FDA and has been authorized for detection and/or diagnosis of SARS-CoV-2 by FDA under an Emergency Use Authorization (EUA). This EUA will remain in effect (meaning this test can be used) for the duration of the COVID-19 declaration under Section 564(b)(1) of the Act, 21 U.S.C. section 360bbb-3(b)(1), unless the authorization is terminated or revoked.  Performed at [facility], [place] [address] [place], [place] [address]   MRSA Next Gen by PCR, Nasal     Status: None   Collection Time: 08/23/22 11:56 PM   Specimen: Nasal Mucosa; Nasal Swab  Result Value Ref Range Status   MRSA by PCR Next Gen NOT DETECTED NOT DETECTED Final    Comment: (NOTE) The  GeneXpert MRSA Assay (FDA approved for NASAL specimens only), is one component of a comprehensive MRSA colonization surveillance program. It is not intended to diagnose MRSA infection nor to guide or monitor treatment for MRSA infections. Test performance is not FDA approved in patients less than 17 years old. Performed at [facility], [place] [address], [place], [place] [address]   Culture, blood (routine x 2)     Status: None (Preliminary result)   Collection Time: 08/30/22  5:47 PM   Specimen: BLOOD  Result Value Ref Range Status   Specimen Description   Final    BLOOD BLOOD RIGHT FOREARM Performed at [facility] [address], [place], [place] [address]    Special Requests   Final    BOTTLES DRAWN AEROBIC AND ANAEROBIC Blood Culture adequate volume Performed at [facility] [address] [place], [place] [address]    Culture   Final    NO GROWTH 2 DAYS Performed  at [facility], [place] [address], [place], [place] [address]    Report Status PENDING  Incomplete  Culture, blood (routine x 2)     Status: None (Preliminary result)   Collection Time: 08/30/22  6:00 PM   Specimen: BLOOD  Result Value Ref Range Status   Specimen Description   Final    BLOOD LEFT ANTECUBITAL Performed at [facility] [address], [place], [place] [address]    Special Requests   Final    BOTTLES DRAWN AEROBIC AND ANAEROBIC Blood Culture adequate volume Performed at [facility] [address] [place], [place] [address]    Culture   Final    NO GROWTH 2 DAYS Performed at [facility] [address] [place], [place] [address]    Report Status PENDING  Incomplete  Gastrointestinal Panel by PCR , Stool     Status: None   Collection Time: 08/31/22 12:51 AM   Specimen: Stool  Result Value Ref Range Status   Campylobacter species NOT DETECTED NOT DETECTED Final   Plesimonas shigelloides NOT  DETECTED NOT DETECTED Final   Salmonella species NOT DETECTED NOT DETECTED Final   Yersinia enterocolitica NOT DETECTED NOT DETECTED Final   Vibrio species NOT DETECTED NOT DETECTED Final   Vibrio cholerae NOT DETECTED NOT DETECTED Final   Enteroaggregative E coli (EAEC) NOT DETECTED NOT DETECTED Final   Enteropathogenic E coli (EPEC) NOT DETECTED NOT DETECTED Final   Enterotoxigenic E coli (ETEC) NOT DETECTED NOT DETECTED Final   Shiga like toxin producing E coli (STEC) NOT DETECTED NOT DETECTED Final   Shigella/Enteroinvasive E coli (EIEC) NOT DETECTED NOT DETECTED Final   Cryptosporidium NOT DETECTED NOT DETECTED Final   Cyclospora cayetanensis NOT DETECTED NOT DETECTED Final   Entamoeba histolytica NOT DETECTED NOT DETECTED Final   Giardia lamblia NOT DETECTED NOT DETECTED Final   Adenovirus F40/41 NOT DETECTED NOT DETECTED Final   Astrovirus NOT DETECTED NOT DETECTED Final   Norovirus GI/GII NOT DETECTED NOT DETECTED Final   Rotavirus A NOT DETECTED NOT DETECTED Final   Sapovirus (I, II, IV, and V) NOT DETECTED NOT DETECTED Final    Comment: Performed at [facility], [address] [place], [place] [address]  C Difficile Quick Screen w PCR reflex     Status: None   Collection Time: 08/31/22 12:51 AM   Specimen: STOOL  Result Value Ref Range Status   C Diff antigen NEGATIVE NEGATIVE Final   C Diff toxin NEGATIVE NEGATIVE Final   C Diff interpretation No C. difficile detected.  Final    Comment: Performed at [facility], [place] [address], [place], [place] [address]     Labs: BNP (last 3 results) Recent Labs    08/23/22 0022  BNP 627.0*   Basic Metabolic Panel: Recent Labs  Lab 08/26/22 0001 08/26/22 1154 08/30/22 1747 08/30/22 1753 08/31/22 0003 08/31/22 0728 09/01/22 0402  NA 137 137 141  --  142 143 140  K 3.0* 3.4* 2.8*  --  3.0* 3.3* 3.4*  CL 106 107 109  --  112* 112* 108  CO2 20* 19* 24  --  21* 26 26  GLUCOSE 128* 191* 131*   --  120* 98 120*  BUN 33* 22 10  --  9 7* 8  CREATININE 1.93* 1.51* 1.17  --  1.05 1.09 1.23  CALCIUM 8.2* 8.4* 8.3*  --  8.0* 8.0* 8.2*  MG 2.0  --   --  1.6* 2.2  --   --    Liver Function Tests: Recent Labs  Lab 08/26/22 0001 08/30/22 1747  AST 28 16  ALT 22 21  ALKPHOS 64 77  BILITOT 1.2 0.5  PROT 5.4* 7.0  ALBUMIN 3.0* 3.8   Recent Labs  Lab 08/30/22 2337  LIPASE 49   No results for input(s): "AMMONIA" in the last 168 hours. CBC: Recent Labs  Lab 08/30/22 1747 08/31/22 0728  WBC 6.6 5.3  NEUTROABS 4.0  --   HGB 12.8* 11.7*  HCT 38.4* 35.1*  MCV 93.7 95.1  PLT 247 223   Cardiac Enzymes: No results for input(s): "CKTOTAL", "CKMB", "CKMBINDEX", "TROPONINI" in the last 168 hours. BNP: Invalid input(s): "POCBNP" CBG: Recent Labs  Lab 08/31/22 0729 08/31/22 1144 08/31/22 1649 08/31/22 1942 09/01/22 0745  GLUCAP 79 144* 145* 150* 120*   D-Dimer No results for input(s): "DDIMER" in the last 72 hours. Hgb A1c No results for input(s): "HGBA1C" in the last 72 hours. Lipid Profile No results for input(s): "CHOL", "HDL", "LDLCALC", "TRIG", "CHOLHDL", "LDLDIRECT" in the last 72 hours. Thyroid function studies Recent Labs    08/30/22 1856  TSH 0.250*   Anemia work up No results for input(s): "VITAMINB12", "FOLATE", "FERRITIN", "TIBC", "IRON", "RETICCTPCT" in the last 72 hours. Urinalysis    Component Value Date/Time   COLORURINE STRAW (A) 08/23/2022 1218   APPEARANCEUR CLEAR 08/23/2022 1218   LABSPEC 1.020 08/23/2022 1218   PHURINE 6.0 08/23/2022 1218   GLUCOSEU >=500 (A) 08/23/2022 1218   HGBUR MODERATE (A) 08/23/2022 1218   BILIRUBINUR NEGATIVE 08/23/2022 1218   KETONESUR 5 (A) 08/23/2022 1218   PROTEINUR 100 (A) 08/23/2022 1218   UROBILINOGEN 1.0 10/25/2011 1058   NITRITE NEGATIVE 08/23/2022 1218   LEUKOCYTESUR NEGATIVE 08/23/2022 1218   Sepsis Labs Recent Labs  Lab 08/30/22 1747 08/31/22 0728  WBC 6.6 5.3   Microbiology Recent Results  (from the past 240 hour(s))  Resp panel by RT-PCR (RSV, Flu A&B, Covid) Anterior Nasal Swab     Status: None   Collection Time: 08/23/22 12:22 AM   Specimen: Anterior Nasal Swab  Result Value Ref Range Status   SARS Coronavirus 2 by RT PCR NEGATIVE NEGATIVE Final    Comment: (NOTE) SARS-CoV-2 target nucleic acids are NOT DETECTED.  The SARS-CoV-2 RNA is generally detectable in upper respiratory specimens during the acute phase of infection. The lowest concentration of SARS-CoV-2 viral copies this assay can detect is 138 copies/mL. A negative result does not preclude SARS-Cov-2 infection and should not be used as the sole basis for treatment or other patient management decisions. A negative result may occur with  improper specimen collection/handling, submission of specimen other than nasopharyngeal swab, presence of viral mutation(s) within the areas targeted by this assay, and inadequate number of viral copies(<138 copies/mL). A negative result must be combined with clinical observations, patient history, and epidemiological information. The expected result is Negative.  Fact Sheet for Patients:  EntrepreneurPulse.com.au  Fact Sheet for Healthcare Providers:  IncredibleEmployment.be  This test is no t yet approved or cleared by the [place] FDA and  has been authorized for detection and/or diagnosis of SARS-CoV-2 by FDA under an Emergency Use Authorization (EUA). This EUA will remain  in effect (meaning this test can be used) for the duration of the COVID-19 declaration under Section 564(b)(1) of the Act, 21 U.S.C.section 360bbb-3(b)(1), unless the authorization is terminated  or revoked sooner.       Influenza A by PCR NEGATIVE NEGATIVE  Final   Influenza B by PCR NEGATIVE NEGATIVE Final    Comment: (NOTE) The Xpert Xpress SARS-CoV-2/FLU/RSV plus assay is intended as an aid in the diagnosis of influenza from Nasopharyngeal swab  specimens and should not be used as a sole basis for treatment. Nasal washings and aspirates are unacceptable for Xpert Xpress SARS-CoV-2/FLU/RSV testing.  Fact Sheet for Patients: EntrepreneurPulse.com.au  Fact Sheet for Healthcare Providers: IncredibleEmployment.be  This test is not yet approved or cleared by the [place] FDA and has been authorized for detection and/or diagnosis of SARS-CoV-2 by FDA under an Emergency Use Authorization (EUA). This EUA will remain in effect (meaning this test can be used) for the duration of the COVID-19 declaration under Section 564(b)(1) of the Act, 21 U.S.C. section 360bbb-3(b)(1), unless the authorization is terminated or revoked.     Resp Syncytial Virus by PCR NEGATIVE NEGATIVE Final    Comment: (NOTE) Fact Sheet for Patients: EntrepreneurPulse.com.au  Fact Sheet for Healthcare Providers: IncredibleEmployment.be  This test is not yet approved or cleared by the [place] FDA and has been authorized for detection and/or diagnosis of SARS-CoV-2 by FDA under an Emergency Use Authorization (EUA). This EUA will remain in effect (meaning this test can be used) for the duration of the COVID-19 declaration under Section 564(b)(1) of the Act, 21 U.S.C. section 360bbb-3(b)(1), unless the authorization is terminated or revoked.  Performed at [facility], [place] [address] [place], [place] [address]   MRSA Next Gen by PCR, Nasal     Status: None   Collection Time: 08/23/22 11:56 PM   Specimen: Nasal Mucosa; Nasal Swab  Result Value Ref Range Status   MRSA by PCR Next Gen NOT DETECTED NOT DETECTED Final    Comment: (NOTE) The GeneXpert MRSA Assay (FDA approved for NASAL specimens only), is one component of a comprehensive MRSA colonization surveillance program. It is not intended to diagnose MRSA infection nor to guide or monitor treatment for MRSA  infections. Test performance is not FDA approved in patients less than 76 years old. Performed at [facility], [place] [address] [place], [place] [address]   Culture, blood (routine x 2)     Status: None (Preliminary result)   Collection Time: 08/30/22  5:47 PM   Specimen: BLOOD  Result Value Ref Range Status   Specimen Description   Final    BLOOD BLOOD RIGHT FOREARM Performed at [facility] [address] [place], [place] [address]    Special Requests   Final    BOTTLES DRAWN AEROBIC AND ANAEROBIC Blood Culture adequate volume Performed at [facility] [address], [place], [place] [address]    Culture   Final    NO GROWTH 2 DAYS Performed at [facility] [address] [place], [place] [address]    Report Status PENDING  Incomplete  Culture, blood (routine x 2)     Status: None (Preliminary result)   Collection Time: 08/30/22  6:00 PM   Specimen: BLOOD  Result Value Ref Range Status   Specimen Description   Final    BLOOD LEFT ANTECUBITAL Performed at [facility] [address], [place], [place] [address]    Special Requests   Final    BOTTLES DRAWN AEROBIC AND ANAEROBIC Blood Culture adequate volume Performed at [facility] [address] [place], [place] [address]    Culture   Final    NO GROWTH 2 DAYS Performed at  [facility], [place] [address] [place], [place] [address]    Report Status PENDING  Incomplete  Gastrointestinal Panel by PCR , Stool     Status: None   Collection Time: 08/31/22 12:51 AM   Specimen: Stool  Result Value Ref Range Status   Campylobacter species NOT DETECTED NOT DETECTED Final   Plesimonas shigelloides NOT DETECTED NOT DETECTED Final   Salmonella species NOT DETECTED NOT DETECTED Final   Yersinia enterocolitica NOT DETECTED NOT DETECTED Final   Vibrio species NOT DETECTED NOT DETECTED Final   Vibrio cholerae NOT DETECTED NOT DETECTED  Final   Enteroaggregative E coli (EAEC) NOT DETECTED NOT DETECTED Final   Enteropathogenic E coli (EPEC) NOT DETECTED NOT DETECTED Final   Enterotoxigenic E coli (ETEC) NOT DETECTED NOT DETECTED Final   Shiga like toxin producing E coli (STEC) NOT DETECTED NOT DETECTED Final   Shigella/Enteroinvasive E coli (EIEC) NOT DETECTED NOT DETECTED Final   Cryptosporidium NOT DETECTED NOT DETECTED Final   Cyclospora cayetanensis NOT DETECTED NOT DETECTED Final   Entamoeba histolytica NOT DETECTED NOT DETECTED Final   Giardia lamblia NOT DETECTED NOT DETECTED Final   Adenovirus F40/41 NOT DETECTED NOT DETECTED Final   Astrovirus NOT DETECTED NOT DETECTED Final   Norovirus GI/GII NOT DETECTED NOT DETECTED Final   Rotavirus A NOT DETECTED NOT DETECTED Final   Sapovirus (I, II, IV, and V) NOT DETECTED NOT DETECTED Final    Comment: Performed at [facility], [address] [place], [place] [address]  C Difficile Quick Screen w PCR reflex     Status: None   Collection Time: 08/31/22 12:51 AM   Specimen: STOOL  Result Value Ref Range Status   C Diff antigen NEGATIVE NEGATIVE Final   C Diff toxin NEGATIVE NEGATIVE Final   C Diff interpretation No C. difficile detected.  Final    Comment: Performed at [facility], [place] [address], [place], [place] [address]    Please note: You were cared for by a hospitalist during your hospital stay. Once you are discharged, your primary care physician will handle any further medical issues. Please note that NO REFILLS for any discharge medications will be authorized once you are discharged, as it is imperative that you return to your primary care physician (or establish a relationship with a primary care physician if you do not have one) for your post hospital discharge needs so that they can reassess your need for medications and monitor your lab values.    Time coordinating discharge: 40 minutes  SIGNED:   Shelly Coss,  MD  [facility] 09/01/2022, 10:42 AM Pager [phone]  If 7PM-7AM, please contact night-coverage [email or web address] Password [email or web address]

## 2022-09-02 LAB — T3: T3, Total: 130 ng/dL (ref 71–180)

## 2022-09-04 ENCOUNTER — Other Ambulatory Visit (HOSPITAL_COMMUNITY): Payer: Self-pay

## 2022-09-04 LAB — CULTURE, BLOOD (ROUTINE X 2)
Culture: NO GROWTH
Culture: NO GROWTH
Special Requests: ADEQUATE
Special Requests: ADEQUATE

## 2022-09-06 ENCOUNTER — Other Ambulatory Visit (HOSPITAL_COMMUNITY): Payer: Self-pay

## 2022-09-07 ENCOUNTER — Telehealth: Payer: Self-pay

## 2022-09-07 ENCOUNTER — Inpatient Hospital Stay: Payer: Medicare HMO | Attending: Oncology

## 2022-09-07 ENCOUNTER — Inpatient Hospital Stay (HOSPITAL_BASED_OUTPATIENT_CLINIC_OR_DEPARTMENT_OTHER): Payer: Medicare HMO | Admitting: Hematology and Oncology

## 2022-09-07 ENCOUNTER — Other Ambulatory Visit: Payer: Self-pay

## 2022-09-07 VITALS — BP 134/66 | HR 85 | Temp 98.3°F | Resp 14 | Wt 205.3 lb

## 2022-09-07 DIAGNOSIS — Z79899 Other long term (current) drug therapy: Secondary | ICD-10-CM | POA: Diagnosis not present

## 2022-09-07 DIAGNOSIS — C9001 Multiple myeloma in remission: Secondary | ICD-10-CM | POA: Insufficient documentation

## 2022-09-07 LAB — CBC WITH DIFFERENTIAL (CANCER CENTER ONLY)
Abs Immature Granulocytes: 0.01 10*3/uL (ref 0.00–0.07)
Basophils Absolute: 0 10*3/uL (ref 0.0–0.1)
Basophils Relative: 1 %
Eosinophils Absolute: 0.3 10*3/uL (ref 0.0–0.5)
Eosinophils Relative: 6 %
HCT: 34.9 % — ABNORMAL LOW (ref 39.0–52.0)
Hemoglobin: 11.9 g/dL — ABNORMAL LOW (ref 13.0–17.0)
Immature Granulocytes: 0 %
Lymphocytes Relative: 29 %
Lymphs Abs: 1.2 10*3/uL (ref 0.7–4.0)
MCH: 31.9 pg (ref 26.0–34.0)
MCHC: 34.1 g/dL (ref 30.0–36.0)
MCV: 93.6 fL (ref 80.0–100.0)
Monocytes Absolute: 0.4 10*3/uL (ref 0.1–1.0)
Monocytes Relative: 9 %
Neutro Abs: 2.2 10*3/uL (ref 1.7–7.7)
Neutrophils Relative %: 55 %
Platelet Count: 214 10*3/uL (ref 150–400)
RBC: 3.73 MIL/uL — ABNORMAL LOW (ref 4.22–5.81)
RDW: 15.8 % — ABNORMAL HIGH (ref 11.5–15.5)
WBC Count: 4.1 10*3/uL (ref 4.0–10.5)
nRBC: 0 % (ref 0.0–0.2)

## 2022-09-07 LAB — CMP (CANCER CENTER ONLY)
ALT: 9 U/L (ref 0–44)
AST: 12 U/L — ABNORMAL LOW (ref 15–41)
Albumin: 3.8 g/dL (ref 3.5–5.0)
Alkaline Phosphatase: 87 U/L (ref 38–126)
Anion gap: 7 (ref 5–15)
BUN: 8 mg/dL (ref 8–23)
CO2: 24 mmol/L (ref 22–32)
Calcium: 8.9 mg/dL (ref 8.9–10.3)
Chloride: 108 mmol/L (ref 98–111)
Creatinine: 1.14 mg/dL (ref 0.61–1.24)
GFR, Estimated: >60 mL/min (ref >60)
Glucose, Bld: 107 mg/dL — ABNORMAL HIGH (ref 70–99)
Potassium: 3.9 mmol/L (ref 3.5–5.1)
Sodium: 139 mmol/L (ref 135–145)
Total Bilirubin: 0.4 mg/dL (ref 0.3–1.2)
Total Protein: 6 g/dL — ABNORMAL LOW (ref 6.5–8.1)

## 2022-09-07 MED ORDER — LENALIDOMIDE 15 MG PO CAPS: 15.0000 mg | ORAL_CAPSULE | Freq: Every day | ORAL | 0 refills | Status: DC

## 2022-09-07 NOTE — Telephone Encounter (Signed)
Called pharmacist at [facility] to advise that patient has been hospitalized in January and Revlimid had been discontinued during his hospitalization Called [facility] to transfer patient from Dr. Alen Blew to Dr. Lorenso Courier in the system. Authorization number obtained and prescription sent to [facility]. Called patient's wife to advise that patient not resume Revlimid until he hears from [employer].

## 2022-09-07 NOTE — Progress Notes (Signed)
[name] [phone]   [phone]  PROGRESS NOTE  Patient Care Team: James Akin, NP as PCP - General James Dresser, MD as PCP - Cardiology (Cardiology)  Hematological/Oncological History # IgA Kappa Multiple Myeloma in Remission 2017: diagnosed with IgA Kappa MM 01/28/2016: Revlimid at 25 mg daily for 21 days with dexamethasone at 20 mg weekly. Reached CR in Jan 2018 10/2018: relapsed disease with M spike of 3.9 g/dL IgM level of 4707.  May 2020: Revlimid 15 mg daily for 21 days out of a 28 day cycle with dexamethasone 40 mg weekly  May 2021: achieved a complete response to therapy. Started maintenance Revlimid 15 mg daily restarted in June 2021 with dexamethasone 4 mg weekly  09/07/2022: transition care to Dr. Lorenso [name]   Interval History:  James [name]. 77 y.o. male with medical history significant for IgA Kappa MM who presents for a follow up visit. The patient's last visit was on 06/08/2022 with Dr. Alen [name]. In the interim since the last visit he was admitted for pulmonary embolism and his Revlimid was held.  He was subsequently started on Eliquis therapy.  On exam today James [name] reports that he has been having difficulty with sleeping.  He reports that while in the hospital he was having trouble sleeping.  He reports he took James [name] for sleep and it did not help.  He reports that he has been steadily taking his chemotherapy for 3 years and is discouraged by the fact that it was stopped while he was in the hospital.  He reports he is currently tolerating his blood thinner well with no nosebleeds, gum bleeding, or dark stools.  He reports he is prone to chronic diarrhea but is not active.  He reports that the Revlimid does not cause him any major side effects though he did have some trouble with the steroids.  He otherwise denies any fevers, chills, sweats, nausea, vomiting or diarrhea.  He denies any chest pain or shortness of  breath.  A full 10 point ROS was otherwise negative.  MEDICAL HISTORY:  Past Medical History:  Diagnosis Date   Anemia    Arthritis    BPH (benign prostatic hyperplasia)    CHF (congestive heart failure) (HCC)    Chronic pain    Diabetes mellitus    Dyspnea    GERD (gastroesophageal reflux disease)    Headache(784.0)    migraines, sinus headaches   Hepatitis    C and B-treated   HLD (hyperlipidemia)    Hypertension    Leukemia ([name])    Lupus ([place])    Multiple myeloma ([place])    Pneumonia 07/2011   Sleep apnea 03/2018   going for fitting on 04-29-18   Thyroid disease    goiter   Urinary frequency     SURGICAL HISTORY: Past Surgical History:  Procedure Laterality Date   ABDOMINAL SURGERY     BACK SURGERY     x 5    BIOPSY  01/02/2020   Procedure: BIOPSY;  Surgeon: James Ada, MD;  Location: [name];  Service: [name];;   CHOLECYSTECTOMY     COLONOSCOPY     COLONOSCOPY WITH PROPOFOL N/A 01/02/2020   Procedure: COLONOSCOPY WITH PROPOFOL;  Surgeon: James Ada, MD;  Location: [name];  Service: [name];  Laterality: N/A;   cyst removal skull  20 years ago   ETHMOIDECTOMY  10/12/2011   Procedure: ETHMOIDECTOMY;  Surgeon: James Sartorius, MD;  Location: [name];  Service: [name];  Laterality: Bilateral;  bilateral maxillary sinus osteal enlargement, frontal sinusotomy   EYE SURGERY     bilat cataract with lens implants   HAND SURGERY     right finger   HERNIA REPAIR     MICROLARYNGOSCOPY N/A 02/18/2021   Procedure: MICROLARYNGOSCOPY with Excisional Biopsy;  Surgeon: James Belfast, MD;  Location: [name];  Service: James [name];  Laterality: N/A;   POLYPECTOMY  01/02/2020   Procedure: POLYPECTOMY;  Surgeon: James Ada, MD;  Location: [name];  Service: [name];;   ROTATOR CUFF REPAIR     bilateral   SHOULDER ARTHROSCOPY WITH ROTATOR CUFF REPAIR Left 05/18/2014   Procedure: SHOULDER ARTHROSCOPY WITH ROTATOR CUFF REPAIR;  Surgeon: James Sells, MD;   Location: [name];  [name];  Laterality: Left;  Left shoulder arthroscopy rotator cuff repair, subacromial decompression   sinus surgery     TONSILLECTOMY     TRIGGER FINGER RELEASE Right 05/02/2018   Procedure: RELEASE TRIGGER FINGER/A-1 PULLEY RIGHT SMALL FINGER;  Surgeon: James Cover, MD;  Location: [name];  [name];  Laterality: Right;    SOCIAL HISTORY: Social History   Socioeconomic History   Marital status: Married    Spouse name: Not on file   Number of children: Not on file   Years of education: Not on file   Highest education level: Not on file  Occupational History   Not on file  Tobacco Use   Smoking status: Former    Packs/day: 1.00    Years: 26.00    Total pack years: 26.00    Types: Cigarettes    Quit date: 05/12/1984    Years since quitting: 38.3   Smokeless tobacco: Never  Vaping Use   Vaping Use: Not on file  Substance and Sexual Activity   Alcohol use: No   Drug use: Not Currently    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 years ago   Sexual activity: Not Currently  Other Topics Concern   Not on file  Social History Narrative   Not on file   Social Determinants of Health   Financial Resource Strain: Not on file  Food Insecurity: No Food Insecurity (08/31/2022)   Hunger Vital Sign    Worried About Running Out of Food in the Last Year: Never true    Ran Out of Food in the Last Year: Never true  Transportation Needs: No Transportation Needs (08/31/2022)   PRAPARE - Hydrologist (Medical): No    Lack of Transportation (Non-Medical): No  Physical Activity: Not on file  Stress: Not on file  Social Connections: Not on file  Intimate Partner Violence: Not At Risk (08/31/2022)   Humiliation, Afraid, Rape, and Kick questionnaire    Fear of Current or Ex-Partner: No    Emotionally Abused: No    Physically Abused: No    Sexually Abused: No    FAMILY HISTORY: Family  History  Problem Relation Age of Onset   Hyperlipidemia Mother    Hypertension Mother     ALLERGIES:  is allergic to invokana [canagliflozin] and ace inhibitors.  MEDICATIONS:  Current Outpatient Medications  Medication Sig Dispense Refill   albuterol (VENTOLIN HFA) 108 (90 Base) MCG/ACT inhaler Inhale into the lungs.     amLODipine (NORVASC) 10 MG tablet Take 1 tablet (10 mg total) by mouth every morning. 30 tablet 0   APIXABAN (ELIQUIS) VTE STARTER PACK ('10MG'$  AND '5MG'$ ) Take 2 tablets (10 mg total) twice daily  for 4 days then continue taking 1 tablet ( 5 mg) twice daily 74 each 0   bismuth subsalicylate (PEPTO BISMOL) 262 MG/15ML suspension Take 30 mLs by mouth every 6 (six) hours as needed for indigestion or diarrhea or loose stools.     Calcium Carbonate-Vitamin D (CALCIUM 600+D PO) Take 1 tablet by mouth in the morning and at bedtime.     colestipol (COLESTID) 1 g tablet Take 1 g by mouth 2 (two) times daily.     dexamethasone (DECADRON) 4 MG tablet TAKE 5 TABLETS BY MOUTH ONCE A WEEK 64 tablet 0   diphenhydrAMINE HCl (ZZZQUIL) 50 MG/30ML LIQD Take 30 mLs by mouth at bedtime as needed (sleep).     Ferrous Fumarate (HEMOCYTE - 106 MG FE) 324 (106 Fe) MG TABS tablet Take 1 tablet by mouth 2 (two) times daily.     finasteride (PROSCAR) 5 MG tablet Take 5 mg by mouth daily.     furosemide (LASIX) 20 MG tablet TAKE 1 TABLET BY MOUTH EVERY DAY 90 tablet 3   gabapentin (NEURONTIN) 400 MG capsule Take 400-800 mg by mouth See admin instructions. Take '400mg'$  at lunch time and  2 Capsules (800 mg) at bedtime     HYDROcodone-acetaminophen (NORCO) 10-325 MG tablet Take 1 tablet by mouth in the morning, at noon, and at bedtime.     insulin degludec (TRESIBA FLEXTOUCH) 100 UNIT/ML FlexTouch Pen Inject into the skin daily.     lenalidomide (REVLIMID) 15 MG capsule Take 1 capsule (15 mg total) by mouth daily. Take 1 capsule for 21 days and then off for 7 days. Discard capsules already on hand. 21 capsule 0    lidocaine (LIDODERM) 5 % Place 1 patch onto the skin daily. Remove & Discard patch within 12 hours or as directed by MD 30 patch 0   loperamide (IMODIUM A-D) 2 MG tablet Take 1 tablet (2 mg total) by mouth 3 (three) times daily as needed for diarrhea or loose stools. 20 tablet 0   losartan (COZAAR) 50 MG tablet Take 1 tablet (50 mg total) by mouth every morning. 30 tablet 0   morphine (MS CONTIN) 30 MG 12 hr tablet Take by mouth.     pantoprazole (PROTONIX) 40 MG tablet Take 40 mg by mouth 2 (two) times daily.     pioglitazone (ACTOS) 15 MG tablet Take 15 mg by mouth in the morning.     potassium chloride SA (KLOR-CON M) 20 MEQ tablet Take 1 tablet (20 mEq total) by mouth daily. Take 2 pills daily for 5 days then continue taking 1 pill daily 30 tablet 0   RESTASIS 0.05 % ophthalmic emulsion Place 1 drop into both eyes 2 (two) times daily as needed for dry eyes.     Tamsulosin HCl (FLOMAX) 0.4 MG CAPS Take 0.4 mg by mouth 2 (two) times daily.      tiZANidine (ZANAFLEX) 4 MG tablet Take 1 tablet (4 mg total) by mouth every 8 (eight) hours as needed for muscle spasms. 30 tablet 0   traMADol (ULTRAM) 50 MG tablet Take 50 mg by mouth every 6 (six) hours as needed for moderate pain or severe pain.     TRELEGY ELLIPTA 200-62.5-25 MCG/ACT AEPB Take 1 puff by mouth 2 (two) times daily. 60 each 0   vitamin B-12 (CYANOCOBALAMIN) 1000 MCG tablet Take 1,000 mcg by mouth in the morning and at bedtime.     zolpidem (AMBIEN) 5 MG tablet Take 1 tablet (5 mg total) by  mouth at bedtime as needed for sleep. 5 tablet 0   No current facility-administered medications for this visit.    REVIEW OF SYSTEMS:   Constitutional: ( - ) fevers, ( - )  chills , ( - ) night sweats Eyes: ( - ) blurriness of vision, ( - ) double vision, ( - ) watery eyes Ears, nose, mouth, throat, and face: ( - ) mucositis, ( - ) sore throat Respiratory: ( - ) cough, ( - ) dyspnea, ( - ) wheezes Cardiovascular: ( - ) palpitation, ( - ) chest  discomfort, ( - ) lower extremity swelling Gastrointestinal:  ( - ) nausea, ( - ) heartburn, ( - ) change in bowel habits Skin: ( - ) abnormal skin rashes Lymphatics: ( - ) new lymphadenopathy, ( - ) easy bruising Neurological: ( - ) numbness, ( - ) tingling, ( - ) new weaknesses Behavioral/Psych: ( - ) mood change, ( - ) new changes  All other systems were reviewed with the patient and are negative.  PHYSICAL EXAMINATION: ECOG PERFORMANCE STATUS: 1 - Symptomatic but completely ambulatory  Vitals:   09/07/22 1440  BP: 134/66  Pulse: 85  Resp: 14  Temp: 98.3 F (36.8 C)  SpO2: 100%   Filed Weights   09/07/22 1440  Weight: 205 lb 4.8 oz (93.1 kg)    GENERAL: alert, no distress and comfortable SKIN: skin color, texture, turgor are normal, no rashes or significant lesions EYES: conjunctiva are pink and non-injected, sclera clear OROPHARYNX: no exudate, no erythema; lips, buccal mucosa, and tongue normal  NECK: supple, non-tender LYMPH:  no palpable lymphadenopathy in the cervical, axillary or inguinal LUNGS: clear to auscultation and percussion with normal breathing effort HEART: regular rate & rhythm and no murmurs and no lower extremity edema ABDOMEN: soft, non-tender, non-distended, normal bowel sounds Musculoskeletal: no cyanosis of digits and no clubbing  PSYCH: alert & oriented x 3, fluent speech NEURO: no focal motor/sensory deficits  LABORATORY DATA:  I have reviewed the data as listed    Latest Ref Rng & Units 09/07/2022    2:11 PM 08/31/2022    7:28 AM 08/30/2022    5:47 PM  CBC  WBC 4.0 - 10.5 K/uL 4.1  5.3  6.6   Hemoglobin 13.0 - 17.0 g/dL 11.9  11.7  12.8   Hematocrit 39.0 - 52.0 % 34.9  35.1  38.4   Platelets 150 - 400 K/uL 214  223  247        Latest Ref Rng & Units 09/07/2022    2:11 PM 09/01/2022    4:02 AM 08/31/2022    7:28 AM  CMP  Glucose 70 - 99 mg/dL 107  120  98   BUN 8 - 23 mg/dL '8  8  7   '$ Creatinine 0.61 - 1.24 mg/dL 1.14  1.23  1.09   Sodium  135 - 145 mmol/L 139  140  143   Potassium 3.5 - 5.1 mmol/L 3.9  3.4  3.3   Chloride 98 - 111 mmol/L 108  108  112   CO2 22 - 32 mmol/L '24  26  26   '$ Calcium 8.9 - 10.3 mg/dL 8.9  8.2  8.0   Total Protein 6.5 - 8.1 g/dL 6.0     Total Bilirubin 0.3 - 1.2 mg/dL 0.4     Alkaline Phos 38 - 126 U/L 87     AST 15 - 41 U/L 12     ALT 0 - 44 U/L  9       Lab Results  Component Value Date   MPROTEIN Not Observed 06/08/2022   MPROTEIN Not Observed 03/09/2022   MPROTEIN Not Observed 11/01/2021   Lab Results  Component Value Date   KPAFRELGTCHN 31.6 (H) 06/08/2022   KPAFRELGTCHN 23.7 (H) 03/09/2022   KPAFRELGTCHN 19.2 11/01/2021   LAMBDASER 13.2 06/08/2022   LAMBDASER 14.2 03/09/2022   LAMBDASER 11.1 11/01/2021   KAPLAMBRATIO 2.39 (H) 06/08/2022   KAPLAMBRATIO 1.67 (H) 03/09/2022   KAPLAMBRATIO 1.73 (H) 11/01/2021    RADIOGRAPHIC STUDIES: US THYROID  Result Date: 08/27/2022 CLINICAL DATA:  Incidental on CT. EXAM: THYROID ULTRASOUND TECHNIQUE: Ultrasound examination of the thyroid gland and adjacent soft tissues was performed. COMPARISON:  None Available. FINDINGS: Parenchymal Echotexture: Normal Isthmus: 1.0 cm Right lobe: 5.5 x 2.7 x 2.4 cm Left lobe: 5.5 x 2.6 x 2.7 cm _________________________________________________________ Estimated total number of nodules >/= 1 cm: 3 Number of spongiform nodules >/=  2 cm not described below (TR1): 0 Number of mixed cystic and solid nodules >/= 1.5 cm not described below (TR2): 0 _________________________________________________________ Nodule # 1: Circumscribed anechoic cyst in the right mid gland measures 1.1 x 0.8 x 1.0 cm. This is considered sonographically benign. This nodule does NOT meet TI-RADS criteria for biopsy or dedicated follow-up. Nodule # 2: Anechoic simple cyst with peripheral colloid artifact in the right inferior gland measures up to 1.2 cm. This nodule does NOT meet TI-RADS criteria for biopsy or dedicated follow-up. Nodule # 3:  Location: Left; Inferior Maximum size: 2.1 cm; Other 2 dimensions: 1.9 x 1.9 cm Composition: solid/almost completely solid (2) Echogenicity: isoechoic (1) Shape: not taller-than-wide (0) Margins: ill-defined (0) Echogenic foci: none (0) ACR TI-RADS total points: 3. ACR TI-RADS risk category: TR3 (3 points). ACR TI-RADS recommendations: *Given size (>/= 1.5 - 2.4 cm) and appearance, a follow-up ultrasound in 1 year should be considered based on TI-RADS criteria. _________________________________________________________ IMPRESSION: 1. Ill-defined TI-RADS category 3 nodule versus pseudo nodule at the most inferior aspect of the left thyroid gland measures up to 2.1 cm. This nodule meets criteria for imaging surveillance. Recommend follow-up ultrasound in 1 year. The above is in keeping with the ACR TI-RADS recommendations - J Am Coll Radiol 2017;14:587-595. Electronically Signed   By: Jacqulynn Cadet M.D.   On: 08/27/2022 15:23   CT HEAD WO CONTRAST (5MM)  Result Date: 08/24/2022 CLINICAL DATA:  Mental status change, unknown cause recent trauma, new on Eliquis. rule out intracranial bleeding EXAM: CT HEAD WITHOUT CONTRAST TECHNIQUE: Contiguous axial images were obtained from the base of the skull through the vertex without intravenous contrast. RADIATION DOSE REDUCTION: This exam was performed according to the departmental dose-optimization program which includes automated exposure control, adjustment of the mA and/or kV according to patient size and/or use of iterative reconstruction technique. COMPARISON:  None Available. FINDINGS: Brain: No evidence of acute large vascular territory infarction, hemorrhage, hydrocephalus, extra-axial collection or mass lesion/mass effect. Patchy white hypodensities, nonspecific but compatible with chronic microvascular ischemic disease. Vascular: No hyperdense vessel identified. Calcific atherosclerosis. Skull: No acute fracture. Sinuses/Orbits: Largely clear sinuses.  No acute  orbital findings. Other: No mastoid effusions IMPRESSION: No evidence of acute intracranial abnormality. Electronically Signed   By: Margaretha Sheffield M.D.   On: 08/24/2022 12:29   VAS [place] LOWER EXTREMITY VENOUS (DVT)  Result Date: 08/23/2022  Lower Venous DVT Study Patient Name:  Solon Alban.  Date of Exam:   08/23/2022 Medical Rec #: [id number]  Accession #:    [id number] Date of Birth: 05-Apr-1946              Patient Gender: M Patient Age:   39 years Exam Location:  [facility] Procedure:      VAS [place] LOWER EXTREMITY VENOUS (DVT) Referring Phys: Fuller Plan --------------------------------------------------------------------------------  Indications: Pulmonary embolism.  Limitations: Constant patient movement and poor ultrasound/tissue interface. Comparison Study: 10-26-2021 Prior left lower extremity venous study was                   negative for DVT. Performing Technologist: Darlin Coco RDMS, RVT  Examination Guidelines: A complete evaluation includes B-mode imaging, spectral Doppler, color Doppler, and power Doppler as needed of all accessible portions of each vessel. Bilateral testing is considered an integral part of a complete examination. Limited examinations for reoccurring indications may be performed as noted. The reflux portion of the exam is performed with the patient in reverse Trendelenburg.  +---------+---------------+---------+-----------+----------+-------------------+ RIGHT    CompressibilityPhasicitySpontaneityPropertiesThrombus Aging      +---------+---------------+---------+-----------+----------+-------------------+ CFV      Full           Yes      Yes                                      +---------+---------------+---------+-----------+----------+-------------------+ SFJ      Full                                                             +---------+---------------+---------+-----------+----------+-------------------+ FV Prox  Full                                                              +---------+---------------+---------+-----------+----------+-------------------+ FV Mid   Full                                                             +---------+---------------+---------+-----------+----------+-------------------+ FV DistalFull                                                             +---------+---------------+---------+-----------+----------+-------------------+ PFV      Full                                                             +---------+---------------+---------+-----------+----------+-------------------+ POP      Full           Yes      Yes                                      +---------+---------------+---------+-----------+----------+-------------------+  PTV      Full                                                             +---------+---------------+---------+-----------+----------+-------------------+ PERO                                                  Not well visualized +---------+---------------+---------+-----------+----------+-------------------+   +---------+---------------+---------+-----------+----------+-------------------+ LEFT     CompressibilityPhasicitySpontaneityPropertiesThrombus Aging      +---------+---------------+---------+-----------+----------+-------------------+ CFV      Full           Yes      Yes                                      +---------+---------------+---------+-----------+----------+-------------------+ SFJ      Full                                                             +---------+---------------+---------+-----------+----------+-------------------+ FV Prox  Full                                                             +---------+---------------+---------+-----------+----------+-------------------+ FV Mid   Full                                                              +---------+---------------+---------+-----------+----------+-------------------+ FV DistalFull                                                             +---------+---------------+---------+-----------+----------+-------------------+ PFV      Full                                                             +---------+---------------+---------+-----------+----------+-------------------+ POP      Full           Yes      Yes                                      +---------+---------------+---------+-----------+----------+-------------------+ PTV      Full                                                             +---------+---------------+---------+-----------+----------+-------------------+  PERO                                                  Not well visualized +---------+---------------+---------+-----------+----------+-------------------+     Summary: RIGHT: - There is no evidence of deep vein thrombosis in the lower extremity.  - No cystic structure found in the popliteal fossa.  LEFT: - There is no evidence of deep vein thrombosis in the lower extremity.  - No cystic structure found in the popliteal fossa.  *See table(s) above for measurements and observations. Electronically signed by Harold Barban MD on 08/23/2022 at 8:56:30 PM.    Final    ECHOCARDIOGRAM COMPLETE  Result Date: 08/23/2022    ECHOCARDIOGRAM REPORT   Patient Name:   Jex Strausbaugh. Date of Exam: 08/23/2022 Medical Rec #:  [id number]             Height:       72.0 in Accession #:    [id number]            Weight:       210.0 lb Date of Birth:  10/11/1945             BSA:          2.175 m Patient Age:    62 years              BP:           188/106 mmHg Patient Gender: M                     HR:           99 bpm. Exam Location:  Inpatient Procedure: 2D Echo, Cardiac Doppler, Color Doppler and Intracardiac            Opacification Agent Indications:    Pulmonary embolism  History:        Patient has prior  history of Echocardiogram examinations, most                 recent 11/15/2021. CAD, Arrythmias:RBBB; Risk                 Factors:Hypertension and Former Smoker.  Sonographer:    Clayton Lefort RDCS (AE) Referring Phys: [id number] [facility]  Sonographer Comments: Technically difficult study due to poor echo windows, suboptimal parasternal window, suboptimal apical window and no subcostal window. Image acquisition challenging due to uncooperative patient and Image acquisition challenging due to respiratory motion. IMPRESSIONS  1. Left ventricular ejection fraction, by estimation, is 60 to 65%. The left ventricle has normal function. The left ventricle has no regional wall motion abnormalities. Left ventricular diastolic parameters are indeterminate.  2. Right ventricular systolic function is normal. The right ventricular size is normal.  3. The mitral valve is abnormal. No evidence of mitral valve regurgitation. No evidence of mitral stenosis.  4. The aortic valve is normal in structure. There is mild calcification of the aortic valve. There is mild thickening of the aortic valve. Aortic valve regurgitation is not visualized. Aortic valve sclerosis is present, with no evidence of aortic valve stenosis.  5. The inferior vena cava is normal in size with greater than 50% respiratory variability, suggesting right atrial pressure of 3 mmHg. FINDINGS  Left Ventricle: Left ventricular ejection fraction, by estimation, is 60 to 65%. The left ventricle  has normal function. The left ventricle has no regional wall motion abnormalities. Definity contrast agent was given IV to delineate the left ventricular  endocardial borders. The left ventricular internal cavity size was normal in size. There is no left ventricular hypertrophy. Left ventricular diastolic parameters are indeterminate. Right Ventricle: The right ventricular size is normal. No increase in right ventricular wall thickness. Right ventricular systolic function is  normal. Left Atrium: Left atrial size was normal in size. Right Atrium: Right atrial size was normal in size. Pericardium: There is no evidence of pericardial effusion. Mitral Valve: The mitral valve is abnormal. There is mild thickening of the mitral valve leaflet(s). There is mild calcification of the mitral valve leaflet(s). No evidence of mitral valve regurgitation. No evidence of mitral valve stenosis. Tricuspid Valve: The tricuspid valve is normal in structure. Tricuspid valve regurgitation is not demonstrated. No evidence of tricuspid stenosis. Aortic Valve: The aortic valve is normal in structure. There is mild calcification of the aortic valve. There is mild thickening of the aortic valve. Aortic valve regurgitation is not visualized. Aortic valve sclerosis is present, with no evidence of aortic valve stenosis. Aortic valve mean gradient measures 3.0 mmHg. Aortic valve peak gradient measures 6.8 mmHg. Aortic valve area, by VTI measures 2.97 cm. Pulmonic Valve: The pulmonic valve was normal in structure. Pulmonic valve regurgitation is not visualized. No evidence of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 architectural distortion and early honeycombing consistent with usual interstitial pneumonitis. 3. Aortic and coronary artery atherosclerotic vascular calcifications. 4. Left-sided thyroid nodule again  noted. As before, dedicated thyroid ultrasound can further evaluate. 5. 8-10 mm right middle lobe pulmonary nodule again noted without significant interval change dating back to April of 2023. Recommend 1 additional follow-up CT scan in April 2025. Aortic Atherosclerosis (ICD10-I70.0). Electronically Signed   By: Jacqulynn Cadet M.D.   On: 08/12/2022 15:21   CT ABDOMEN PELVIS W CONTRAST  Result Date: 08/12/2022 CLINICAL DATA:  Nonspecific abdominal pain EXAM: CT ABDOMEN AND PELVIS WITH CONTRAST TECHNIQUE: Multidetector CT imaging of the abdomen and pelvis was performed using the standard protocol following bolus administration of intravenous contrast. RADIATION DOSE REDUCTION: This exam was performed according to the departmental dose-optimization program which includes automated exposure control, adjustment of the mA and/or kV according to patient size and/or use of iterative reconstruction technique. CONTRAST:  114m OMNIPAQUE IOHEXOL 300 MG/ML  SOLN COMPARISON:  MRI lumbar spine 11/28/2016 FINDINGS: Lower chest: See concurrently obtained dedicated CT scan of the chest. Hepatobiliary: Normal hepatic contour and morphology. The gallbladder is surgically absent. No intra or extra biliary ductal dilatation. Numerous circumscribed low-attenuation lesions throughout the liver consistent with simple cysts. Pancreas: Unremarkable. No pancreatic ductal dilatation or surrounding inflammatory changes. Spleen: Normal in size without focal abnormality. Adrenals/Urinary Tract: Normal adrenal glands. A low-attenuation renal lesions are present bilaterally. These are too small for accurate characterization but are statistically highly likely benign cysts. No hydronephrosis, nephrolithiasis or enhancing renal mass. No imaging follow-up recommended. Stomach/Bowel: Stomach is within normal limits. Appendix appears normal. No evidence of bowel wall thickening, distention, or inflammatory changes. Vascular/Lymphatic: Scattered  atherosclerotic vascular calcifications. No aneurysm or dissection. Reproductive: Prostate is unremarkable. Other: No evidence of ascites.  Fat containing umbilical hernia. Musculoskeletal: Similar appearance of multiple chronic lumbar compression fractures most significant at L1. Additional fractures of the inferior endplate of L2, inferior endplate of L3 and superior endplate of L4 appears stable. IMPRESSION: 1. No acute abnormality within the abdomen or pelvis. 2. Hepatic and renal artery cysts. 3. Multiple chronic compression fractures. 4. Fat containing umbilical hernia. 5. Aortic atherosclerosis.  Aortic Atherosclerosis (ICD10-I70.0). Electronically Signed   By: HJacqulynn CadetM.D.   On: 08/12/2022 15:16    ASSESSMENT & PLAN JBari [name] 77y.o. male with medical history significant for IgA Kappa MM who presents for a follow up visit.  # IgA Kappa Multiple Myeloma in Remission -- Recommend restarting Revlimid 15 mg p.o. daily 21 days on and 7 days off. -- Last labs show that M protein is undetectable and serum free light chains are within normal limits -- Labs today show white blood cell count 4.1, hemoglobin 11.9, MCV 93.6, and platelets of 214.  Creatinine is 1.14 with normal LFTs. -- Patient is  currently on Eliquis therapy which should prevent against future VTE while on Revlimid.  Okay to restart the Revlimid in the setting of VTE. -- Recommend labs every 4 weeks with return to clinic in 3 months time.  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 -- After 6 months can consider decreasing down to Eliquis 2.5 mg twice daily.  Would recommend continuation of this as long as the patient is on Revlimid therapy.  No orders of the defined types were placed in this encounter.   All questions were answered. The patient knows to call the clinic with any problems, questions or concerns.  A total of more than 40 minutes were spent  on this encounter with face-to-face time and non-face-to-face time, including preparing to see the patient, ordering tests and/or medications, counseling the patient and coordination of care as outlined above.   Ledell Peoples, MD Department of Hematology/Oncology [place] at [facility] Phone: [phone] Pager: [phone] Email: [email or web address]  09/07/2022 5:14 PM

## 2022-09-08 ENCOUNTER — Telehealth: Payer: Self-pay | Admitting: Hematology and Oncology

## 2022-09-08 NOTE — Telephone Encounter (Signed)
Per 09/07/22 LOS scheduled pt for labs / est pt pt is aware and confirmed

## 2022-09-11 LAB — KAPPA/LAMBDA LIGHT CHAINS
Kappa free light chain: 28 mg/L — ABNORMAL HIGH (ref 3.3–19.4)
Kappa, lambda light chain ratio: 1.77 — ABNORMAL HIGH (ref 0.26–1.65)
Lambda free light chains: 15.8 mg/L (ref 5.7–26.3)

## 2022-09-13 LAB — MULTIPLE MYELOMA PANEL, SERUM
Albumin SerPl Elph-Mcnc: 3.6 g/dL (ref 2.9–4.4)
Albumin/Glob SerPl: 1.6 (ref 0.7–1.7)
Alpha 1: 0.2 g/dL (ref 0.0–0.4)
Alpha2 Glob SerPl Elph-Mcnc: 0.7 g/dL (ref 0.4–1.0)
B-Globulin SerPl Elph-Mcnc: 0.9 g/dL (ref 0.7–1.3)
Gamma Glob SerPl Elph-Mcnc: 0.6 g/dL (ref 0.4–1.8)
Globulin, Total: 2.4 g/dL (ref 2.2–3.9)
IgA: 158 mg/dL (ref 61–437)
IgG (Immunoglobin G), Serum: 601 mg/dL — ABNORMAL LOW (ref 603–1613)
IgM (Immunoglobulin M), Srm: 21 mg/dL (ref 15–143)
Total Protein ELP: 6 g/dL (ref 6.0–8.5)

## 2022-09-16 ENCOUNTER — Other Ambulatory Visit (HOSPITAL_COMMUNITY): Payer: Self-pay

## 2022-09-26 ENCOUNTER — Other Ambulatory Visit (HOSPITAL_COMMUNITY): Payer: Self-pay

## 2022-10-02 ENCOUNTER — Other Ambulatory Visit: Payer: Self-pay

## 2022-10-02 MED ORDER — LENALIDOMIDE 15 MG PO CAPS: 15.0000 mg | ORAL_CAPSULE | Freq: Every day | ORAL | 0 refills | Status: DC

## 2022-10-03 ENCOUNTER — Telehealth: Payer: Self-pay | Admitting: Student

## 2022-10-03 NOTE — Telephone Encounter (Signed)
All meds are to be sent to [facility], also wants office notes faxed to [phone], About to have teeth surgery want to know how long he will be on Elliquis '5mg'$  2x a day

## 2022-10-04 NOTE — Telephone Encounter (Signed)
Likely lifelong as long as he has no major bleeding issues - this is due to both multiple myeloma (though in remission) and treatment with revlimid. It sounds like Dr. Lorenso Courier would be ok with decreasing dose to 2.5 mg twice daily after he's been on eliquis for 6 months. Ok to hold for 2 days before tooth surgery.

## 2022-10-04 NOTE — Telephone Encounter (Signed)
Spoke with Trey Paula she advises patient is now currently on Eliquis for a PE which is a new diagnosis according to her. There wanting to know how long patient should be on Eliquis for. Since patient has been seen in a while we can call to get him scheduled for a f/u  See ov from 09/06/2022 Dr. Verlee Monte please advise

## 2022-10-05 ENCOUNTER — Other Ambulatory Visit: Payer: Self-pay | Admitting: *Deleted

## 2022-10-05 DIAGNOSIS — C9001 Multiple myeloma in remission: Secondary | ICD-10-CM

## 2022-10-06 ENCOUNTER — Inpatient Hospital Stay: Payer: Medicare HMO | Attending: Oncology

## 2022-10-06 ENCOUNTER — Other Ambulatory Visit: Payer: Self-pay

## 2022-10-06 DIAGNOSIS — C9001 Multiple myeloma in remission: Secondary | ICD-10-CM | POA: Diagnosis present

## 2022-10-06 DIAGNOSIS — Z7901 Long term (current) use of anticoagulants: Secondary | ICD-10-CM | POA: Diagnosis not present

## 2022-10-06 DIAGNOSIS — Z86711 Personal history of pulmonary embolism: Secondary | ICD-10-CM | POA: Diagnosis not present

## 2022-10-06 LAB — CBC WITH DIFFERENTIAL (CANCER CENTER ONLY)
Abs Immature Granulocytes: 0 10*3/uL (ref 0.00–0.07)
Basophils Absolute: 0.1 10*3/uL (ref 0.0–0.1)
Basophils Relative: 2 %
Eosinophils Absolute: 0.1 10*3/uL (ref 0.0–0.5)
Eosinophils Relative: 2 %
HCT: 35.2 % — ABNORMAL LOW (ref 39.0–52.0)
Hemoglobin: 11.8 g/dL — ABNORMAL LOW (ref 13.0–17.0)
Immature Granulocytes: 0 %
Lymphocytes Relative: 34 %
Lymphs Abs: 1.8 10*3/uL (ref 0.7–4.0)
MCH: 30.7 pg (ref 26.0–34.0)
MCHC: 33.5 g/dL (ref 30.0–36.0)
MCV: 91.7 fL (ref 80.0–100.0)
Monocytes Absolute: 0.7 10*3/uL (ref 0.1–1.0)
Monocytes Relative: 13 %
Neutro Abs: 2.6 10*3/uL (ref 1.7–7.7)
Neutrophils Relative %: 49 %
Platelet Count: 280 10*3/uL (ref 150–400)
RBC: 3.84 MIL/uL — ABNORMAL LOW (ref 4.22–5.81)
RDW: 14.7 % (ref 11.5–15.5)
WBC Count: 5.3 10*3/uL (ref 4.0–10.5)
nRBC: 0 % (ref 0.0–0.2)

## 2022-10-06 LAB — CMP (CANCER CENTER ONLY)
ALT: 10 U/L (ref 0–44)
AST: 13 U/L — ABNORMAL LOW (ref 15–41)
Albumin: 3.9 g/dL (ref 3.5–5.0)
Alkaline Phosphatase: 79 U/L (ref 38–126)
Anion gap: 8 (ref 5–15)
BUN: 11 mg/dL (ref 8–23)
CO2: 27 mmol/L (ref 22–32)
Calcium: 8.6 mg/dL — ABNORMAL LOW (ref 8.9–10.3)
Chloride: 104 mmol/L (ref 98–111)
Creatinine: 1.33 mg/dL — ABNORMAL HIGH (ref 0.61–1.24)
GFR, Estimated: 55 mL/min — ABNORMAL LOW (ref >60)
Glucose, Bld: 98 mg/dL (ref 70–99)
Potassium: 3.8 mmol/L (ref 3.5–5.1)
Sodium: 139 mmol/L (ref 135–145)
Total Bilirubin: 0.4 mg/dL (ref 0.3–1.2)
Total Protein: 6.7 g/dL (ref 6.5–8.1)

## 2022-10-06 NOTE — Telephone Encounter (Signed)
Called and spoke with Laveka to obtain correct fax number so I could send over info for pt's eliquis. Fax has been sent and confirmation received. Nothing further needed.

## 2022-10-09 LAB — KAPPA/LAMBDA LIGHT CHAINS
Kappa free light chain: 43 mg/L — ABNORMAL HIGH (ref 3.3–19.4)
Kappa, lambda light chain ratio: 1.96 — ABNORMAL HIGH (ref 0.26–1.65)
Lambda free light chains: 21.9 mg/L (ref 5.7–26.3)

## 2022-10-11 LAB — MULTIPLE MYELOMA PANEL, SERUM
Albumin SerPl Elph-Mcnc: 3.4 g/dL (ref 2.9–4.4)
Albumin/Glob SerPl: 1.4 (ref 0.7–1.7)
Alpha 1: 0.3 g/dL (ref 0.0–0.4)
Alpha2 Glob SerPl Elph-Mcnc: 0.7 g/dL (ref 0.4–1.0)
B-Globulin SerPl Elph-Mcnc: 0.9 g/dL (ref 0.7–1.3)
Gamma Glob SerPl Elph-Mcnc: 0.6 g/dL (ref 0.4–1.8)
Globulin, Total: 2.5 g/dL (ref 2.2–3.9)
IgA: 193 mg/dL (ref 61–437)
IgG (Immunoglobin G), Serum: 736 mg/dL (ref 603–1613)
IgM (Immunoglobulin M), Srm: 22 mg/dL (ref 15–143)
Total Protein ELP: 5.9 g/dL — ABNORMAL LOW (ref 6.0–8.5)

## 2022-10-18 ENCOUNTER — Emergency Department (HOSPITAL_COMMUNITY)
Admission: EM | Admit: 2022-10-18 | Discharge: 2022-10-18 | Disposition: A | Discharge status: Home / Self Care | Payer: Medicare HMO | Attending: Emergency Medicine | Admitting: Emergency Medicine

## 2022-10-18 ENCOUNTER — Emergency Department (HOSPITAL_COMMUNITY): Payer: Medicare HMO

## 2022-10-18 DIAGNOSIS — Z23 Encounter for immunization: Secondary | ICD-10-CM | POA: Insufficient documentation

## 2022-10-18 DIAGNOSIS — Y92009 Unspecified place in unspecified non-institutional (private) residence as the place of occurrence of the external cause: Secondary | ICD-10-CM

## 2022-10-18 DIAGNOSIS — M25521 Pain in right elbow: Secondary | ICD-10-CM | POA: Insufficient documentation

## 2022-10-18 DIAGNOSIS — S52021A Displaced fracture of olecranon process without intraarticular extension of right ulna, initial encounter for closed fracture: Secondary | ICD-10-CM | POA: Insufficient documentation

## 2022-10-18 DIAGNOSIS — Y92008 Other place in unspecified non-institutional (private) residence as the place of occurrence of the external cause: Secondary | ICD-10-CM | POA: Diagnosis not present

## 2022-10-18 DIAGNOSIS — W010XXA Fall on same level from slipping, tripping and stumbling without subsequent striking against object, initial encounter: Secondary | ICD-10-CM | POA: Diagnosis not present

## 2022-10-18 DIAGNOSIS — Z79899 Other long term (current) drug therapy: Secondary | ICD-10-CM | POA: Insufficient documentation

## 2022-10-18 DIAGNOSIS — S59901A Unspecified injury of right elbow, initial encounter: Secondary | ICD-10-CM | POA: Diagnosis present

## 2022-10-18 DIAGNOSIS — S52031A Displaced fracture of olecranon process with intraarticular extension of right ulna, initial encounter for closed fracture: Secondary | ICD-10-CM | POA: Insufficient documentation

## 2022-10-18 MED ORDER — OXYCODONE-ACETAMINOPHEN 5-325 MG PO TABS: 1.0000 | ORAL_TABLET | Freq: Four times a day (QID) | ORAL | 0 refills | Status: DC | PRN

## 2022-10-18 MED ORDER — OXYCODONE-ACETAMINOPHEN 5-325 MG PO TABS
1.0000 | ORAL_TABLET | Freq: Once | ORAL | Status: AC
  Administered 2022-10-18: 1 via ORAL
  Filled 2022-10-18: qty 1

## 2022-10-18 MED ORDER — TETANUS-DIPHTH-ACELL PERTUSSIS 5-2.5-18.5 LF-MCG/0.5 IM SUSY
0.5000 mL | PREFILLED_SYRINGE | Freq: Once | INTRAMUSCULAR | Status: AC
  Administered 2022-10-18: 0.5 mL via INTRAMUSCULAR
  Filled 2022-10-18: qty 0.5

## 2022-10-18 NOTE — Progress Notes (Signed)
Orthopedic Tech Progress Note Patient Details:  James [name]. May 24, 1946 [id number]  Ortho Devices Type of Ortho Device: Ace wrap, Cotton web roll, Long arm splint, Stirrup splint, Arm sling Ortho Device/Splint Location: RLE Ortho Device/Splint Interventions: Ordered, Adjustment, Application   Post Interventions Patient Tolerated: Well Instructions Provided: Care of device  Janit Pagan 10/18/2022, 11:22 PM

## 2022-10-18 NOTE — ED Triage Notes (Addendum)
Patient here for evaluation after tripping and falling in his porch and landing on his right arm. No head injury, is alert, oriented, and in no apparent distress at this time.

## 2022-10-18 NOTE — ED Provider Triage Note (Signed)
Emergency Medicine Provider Triage Evaluation Note  James [name]. , a 77 y.o. male  was evaluated in triage.  Pt comes in for evaluation of right arm after a mechanical fall earlier today.  He reports he tripped and fell on his porch and landed on his right side.  Pain is mainly in his right elbow.  Denies head injury, loss of consciousness, neck or back pain.    Review of Systems  Positive: As above Negative: As above  Physical Exam  BP (!) 116/100   Pulse 87   Temp 97.9 F (36.6 C) (Oral)   Resp 19   SpO2 94%  Gen:   Awake, no distress   Resp:  Normal effort  MSK:   Moves extremities without difficulty  Other:  Patient is holding his right arm close to body bent at 90 degrees due to pain  Medical Decision Making  Medically screening exam initiated at 6:09 PM.  Appropriate orders placed.  [name]. was informed that the remainder of the evaluation will be completed by another provider, this initial triage assessment does not replace that evaluation, and the importance of remaining in the ED until their evaluation is complete.     Theressa Stamps R, [place] 10/18/22 [phone]

## 2022-10-18 NOTE — ED Provider Notes (Signed)
[facility] Provider Note   CSN: [id number] Arrival date & time: 10/18/22  1745     History  Chief Complaint  Patient presents with   James [name]. is a 77 y.o. male.   Fall This is a new problem. The current episode started 1 to 2 hours ago. The problem occurs rarely. The problem has not changed since onset.Pertinent negatives include no chest pain, no abdominal pain, no headaches and no shortness of breath. Nothing aggravates the symptoms. Nothing relieves the symptoms. He has tried nothing for the symptoms. The treatment provided no relief.       Home Medications Prior to Admission medications   Medication Sig Start Date End Date Taking? Authorizing Provider  albuterol (VENTOLIN HFA) 108 (90 Base) MCG/ACT inhaler Inhale into the lungs. 03/20/22   [provider]  amLODipine (NORVASC) 10 MG tablet Take 1 tablet (10 mg total) by mouth every morning. 09/01/22   Shelly Coss, MD  APIXABAN Arne Cleveland) VTE STARTER PACK (10MG  AND 5MG ) Take 2 tablets (10 mg total) twice daily for 4 days then continue taking 1 tablet ( 5 mg) twice daily 09/01/22   Shelly Coss, MD  bismuth subsalicylate (PEPTO BISMOL) 262 MG/15ML suspension Take 30 mLs by mouth every 6 (six) hours as needed for indigestion or diarrhea or loose stools.    [provider]  Calcium Carbonate-Vitamin D (CALCIUM 600+D PO) Take 1 tablet by mouth in the morning and at bedtime.    [provider]  colestipol (COLESTID) 1 g tablet Take 1 g by mouth 2 (two) times daily. 02/11/20   [provider]  dexamethasone (DECADRON) 4 MG tablet TAKE 5 TABLETS BY MOUTH ONCE A WEEK 04/17/22   Wyatt Portela, MD  diphenhydrAMINE HCl (ZZZQUIL) 50 MG/30ML LIQD Take 30 mLs by mouth at bedtime as needed (sleep).    [provider]  Ferrous Fumarate (HEMOCYTE - 106 MG FE) 324 (106 Fe) MG TABS tablet Take 1 tablet by mouth 2 (two) times daily.  02/15/21   [provider]  finasteride (PROSCAR) 5 MG tablet Take 5 mg by mouth daily.    [provider]  furosemide (LASIX) 20 MG tablet TAKE 1 TABLET BY MOUTH EVERY DAY 08/08/22   Maryjane Hurter, MD  gabapentin (NEURONTIN) 400 MG capsule Take 400-800 mg by mouth See admin instructions. Take 400mg  at lunch time and  2 Capsules (800 mg) at bedtime 08/26/15   [provider]  HYDROcodone-acetaminophen (NORCO) 10-325 MG tablet Take 1 tablet by mouth in the morning, at noon, and at bedtime. 09/16/16   [provider]  insulin degludec (TRESIBA FLEXTOUCH) 100 UNIT/ML FlexTouch Pen Inject into the skin daily.    [provider]  lenalidomide (REVLIMID) 15 MG capsule Take 1 capsule (15 mg total) by mouth daily. Take 1 capsule for 21 days and then off for 7 days. Discard capsules already on hand. 10/02/22   Orson Slick, MD  lidocaine (LIDODERM) 5 % Place 1 patch onto the skin daily. Remove & Discard patch within 12 hours or as directed by MD 01/02/22   Lennice Sites, DO  loperamide (IMODIUM A-D) 2 MG tablet Take 1 tablet (2 mg total) by mouth 3 (three) times daily as needed for diarrhea or loose stools. 09/01/22   Shelly Coss, MD  losartan (COZAAR) 50 MG tablet Take 1 tablet (50 mg total) by mouth every morning. 09/01/22   Adhikari,  Amrit, MD  morphine (MS CONTIN) 30 MG 12 hr tablet Take by mouth. 07/20/22   [provider]  pantoprazole (PROTONIX) 40 MG tablet Take 40 mg by mouth 2 (two) times daily. 12/14/20   [provider]  pioglitazone (ACTOS) 15 MG tablet Take 15 mg by mouth in the morning. 01/13/21   [provider]  potassium chloride SA (KLOR-CON M) 20 MEQ tablet Take 1 tablet (20 mEq total) by mouth daily. Take 2 pills daily for 5 days then continue taking 1 pill daily 09/02/22   Shelly Coss, MD  RESTASIS 0.05 % ophthalmic emulsion Place 1 drop into both eyes 2 (two) times daily as needed for dry eyes. 11/27/19   [provider]  Tamsulosin HCl (FLOMAX) 0.4 MG CAPS Take 0.4 mg by mouth 2 (two) times daily.     [provider]  tiZANidine (ZANAFLEX) 4 MG tablet Take 1 tablet (4 mg total) by mouth every 8 (eight) hours as needed for muscle spasms. 09/01/22   Shelly Coss, MD  traMADol (ULTRAM) 50 MG tablet Take 50 mg by mouth every 6 (six) hours as needed for moderate pain or severe pain.    [provider]  [name] 200-62.5-25 MCG/ACT AEPB Take 1 puff by mouth 2 (two) times daily. 09/01/22   Shelly Coss, MD  vitamin B-12 (CYANOCOBALAMIN) 1000 MCG tablet Take 1,000 mcg by mouth in the morning and at bedtime.    [provider]  zolpidem (AMBIEN) 5 MG tablet Take 1 tablet (5 mg total) by mouth at bedtime as needed for sleep. 09/01/22   Shelly Coss, MD  terazosin (HYTRIN) 5 MG capsule Take 5 mg by mouth 2 (two) times daily.   10/25/11  [provider]      Allergies    Invokana [canagliflozin] and Ace inhibitors    Review of Systems   Review of Systems  Constitutional:  Negative for chills, fatigue and fever.  HENT:  Negative for congestion.   Respiratory:  Negative for cough, chest tightness, shortness of breath and wheezing.   Cardiovascular:  Negative for chest pain.  Gastrointestinal:  Negative for abdominal pain, constipation, diarrhea, nausea and vomiting.  Genitourinary:  Negative for flank pain and frequency.  Musculoskeletal:  Positive for back pain (chronic  per pt). Negative for neck pain and neck stiffness.  Skin:  Negative for rash and wound.  Neurological:  Negative for weakness, light-headedness and headaches.  Psychiatric/Behavioral:  Negative for agitation.   All other systems reviewed and are negative.   Physical Exam Updated Vital Signs BP (!) 141/75 (BP Location: Left Arm)   Pulse 83   Temp 98.1 F (36.7 C) (Oral)   Resp 18   SpO2 95%  Physical Exam Vitals and nursing note reviewed.  Constitutional:      General: He is not  in acute distress.    Appearance: He is well-developed. He is not ill-appearing, toxic-appearing or diaphoretic.  HENT:     Head: Normocephalic and atraumatic.     Nose: No congestion or rhinorrhea.     Mouth/Throat:     Mouth: Mucous membranes are moist.     Pharynx: No oropharyngeal exudate or posterior oropharyngeal erythema.  Eyes:     Extraocular Movements: Extraocular movements intact.     Conjunctiva/sclera: Conjunctivae normal.     Pupils: Pupils are equal, round, and reactive to light.  Cardiovascular:     Rate and Rhythm: Normal rate and regular rhythm.     Heart sounds:  No murmur heard. Pulmonary:     Effort: Pulmonary effort is normal. No respiratory distress.     Breath sounds: Normal breath sounds. No wheezing, rhonchi or rales.  Chest:     Chest wall: No tenderness.  Abdominal:     General: Abdomen is flat.     Palpations: Abdomen is soft.     Tenderness: There is no abdominal tenderness. There is no guarding or rebound.  Musculoskeletal:        General: Swelling and tenderness present.     Right elbow: Tenderness present.       Arms:     Cervical back: Neck supple. No tenderness.     Right lower leg: No edema.     Left lower leg: No edema.     Comments: Right handed  Intact sensation, strength, and pulse in right.  Abrasion to the right elbow but no laceration.  Tenderness in the elbow.  Rest of exam unremarkable.  Mild tenderness in back he reports it is chronic and not new.  Does not want imaging back there.  Skin:    General: Skin is warm and dry.     Capillary Refill: Capillary refill takes less than 2 seconds.     Findings: No erythema or rash.  Neurological:     General: No focal deficit present.     Mental Status: He is alert.     Sensory: No sensory deficit.     Motor: No weakness.  Psychiatric:        Mood and Affect: Mood normal.     ED Results / Procedures / Treatments   Labs (all labs ordered are listed, but only abnormal results are  displayed) Labs Reviewed - No data to display  EKG None  Radiology DG Elbow 2 Views Right  Result Date: 10/18/2022 CLINICAL DATA:  Fall, right elbow injury EXAM: RIGHT ELBOW - 2 VIEW COMPARISON:  None Available. FINDINGS: There is an acute intra-articular fracture of the olecranon with 2-3 mm proximal displacement and near anatomic alignment of the fracture fragment. The trochlear groove appears congruent. Large right elbow effusion is present. No other fracture identified. Normal overall alignment. Extensive soft tissue swelling superficial to the olecranon. IMPRESSION: 1. Mildly displaced, acute intra-articular fracture of the olecranon. Electronically Signed   By: Fidela Salisbury M.D.   On: 10/18/2022 18:53    Procedures Procedures    Medications Ordered in ED Medications  oxyCODONE-acetaminophen (PERCOCET/ROXICET) 5-325 MG per tablet 1 tablet (1 tablet Oral Given 10/18/22 2151)  Tdap (BOOSTRIX) injection 0.5 mL (0.5 mLs Intramuscular Given 10/18/22 2336)    ED Course/ Medical Decision Making/ A&P                             Medical Decision Making Risk Prescription drug management.    Idan Prime. is a 77 y.o. male with past medical history significant for hypertension, previous pulmonary embolism on Eliquis, multiple myeloma, CHF, COPD, diabetes, and lupus who presents for right elbow pain after fall.  According to the patient and family, patient was on his porch when his foot got caught and he tripped hitting his right elbow on the ground.  He did not hit his head and denies any pain in his head, neck, or chest/abdomen/pelvis.  He reports no pain in his hips or legs.  He chronically has back pain but this is not that different than baseline.  Does not report other preceding  symptoms.  On exam, lungs clear.  Chest nontender.  Abdomen nontender.  Back and minimal tenderness in the midline.  Otherwise intact sensation and strength in extremities.  Intact pulses.  Patient has  an abrasion to his right elbow and has some tenderness.  Patient had x-ray that shows an olecranon fracture.  It appears to be closed.  With the abrasion will update tetanus as he is unsure his last tetanus shot.  Patient requested a pain pill and this was given.  We had a shared decision-making conversation given his back pain being slightly worse than baseline but he reports has had previous imaging that did not show acute injuries and it does not seem that much worse and he does not want to wait for further imaging.  Will hold on imaging of the back at this time.  Spoke to hand surgery with Dr. Grandville Silos who recommended a posterior long-arm splint of the right elbow and sling.  He will need to get seen in clinic next week and they will call him for this.  Will give prescription for pain medicine.  He reports he takes hydrocodone along with other pain medicines but we will substitute oxycodone for the next few days.  Patient agrees with this plan.  After splinting anticipate discharge for outpatient hand surgery follow-up.  Patient was splinted and I reassessed him after the splinting and had intact sensation and strength and cap refill in the fingers.  Patient be discharged for outpatient follow-up with hand surgery as we discussed.  Patient agrees with plan and patient discharged in good condition.        Final Clinical Impression(s) / ED Diagnoses Final diagnoses:  Fall in home, initial encounter  Closed fracture of olecranon process of right ulna, initial encounter    Rx / DC Orders ED Discharge Orders          Ordered    oxyCODONE-acetaminophen (PERCOCET/ROXICET) 5-325 MG tablet  Every 6 hours PRN        10/18/22 2327           Clinical Impression: 1. Fall in home, initial encounter   2. Closed fracture of olecranon process of right ulna, initial encounter     Disposition: Discharge  Condition: Good  I have discussed the results, Dx and Tx plan with the pt(& family if  present). He/she/they expressed understanding and agree(s) with the plan. Discharge instructions discussed at great length. Strict return precautions discussed and pt &/or family have verbalized understanding of the instructions. No further questions at time of discharge.    Discharge Medication List as of 10/18/2022 11:28 PM     START taking these medications   Details  oxyCODONE-acetaminophen (PERCOCET/ROXICET) 5-325 MG tablet Take 1 tablet by mouth every 6 (six) hours as needed for severe pain., Starting Wed 10/18/2022, Normal        Follow Up: Milly Jakob, MD [address] [place] [address] [phone]  Follow up My office will call you tomorrow to make arrangements for continued care for your elbow fracture      Joaquina Nissen, Gwenyth Allegra, MD 10/18/22 [phone]

## 2022-10-18 NOTE — Discharge Instructions (Signed)
Your history, exam, and evaluation are consistent with elbow fracture of the olecranon process.  We updated your tetanus with the abrasion overlying but it appears to be a closed injury.  We splinted it as recommended by orthopedic hand surgery and they want to see you in clinic next week.  They will call you to help set an appointment.  Please follow-up with them.  We had a shared decision-making conversation and agreed to hold on more extensive workup today with your chronic back pain.  If any symptoms are to change or worsen acutely, please return to the nearest emergency department.  You may use the pain medicine and please replace the hydrocodone with the oxycodone for the next few days.  Please do not take the medications at the same time.

## 2022-10-30 ENCOUNTER — Telehealth: Payer: Self-pay | Admitting: *Deleted

## 2022-10-30 NOTE — Telephone Encounter (Signed)
Contacted by Barnett Applebaum, nurse w/[facility] [phone], opt 1. She LVM: stated they received prescription for Lenalidomide on 10/02/22. Patient's medication auth expires 11/01/22 and they need to contact him to arrange delivery. [facility] has not been able to contact patient and asked office to contact them to review patient contact numbers.  Contacted patient to inform him that [facility] attempting to contact him. LVM with [facility] # and asked patient to call them  [place], spoke with Harrie Jeans, nurse with [facility] - confirmed patient info/demographics and phone #.Chuck confirmed that [facility] has also mailed letter to patient with request patient contact them. Gave patient's spouse number as secondary contact.

## 2022-11-02 ENCOUNTER — Other Ambulatory Visit: Payer: Self-pay | Admitting: Physician Assistant

## 2022-11-02 DIAGNOSIS — C9001 Multiple myeloma in remission: Secondary | ICD-10-CM

## 2022-11-03 ENCOUNTER — Other Ambulatory Visit: Payer: Self-pay

## 2022-11-03 ENCOUNTER — Inpatient Hospital Stay: Payer: Medicare HMO | Attending: Hematology and Oncology

## 2022-11-03 ENCOUNTER — Telehealth: Payer: Self-pay | Admitting: Cardiology

## 2022-11-03 DIAGNOSIS — C9001 Multiple myeloma in remission: Secondary | ICD-10-CM

## 2022-11-03 LAB — CBC WITH DIFFERENTIAL (CANCER CENTER ONLY)
Abs Immature Granulocytes: 0.01 10*3/uL (ref 0.00–0.07)
Basophils Absolute: 0.1 10*3/uL (ref 0.0–0.1)
Basophils Relative: 1 %
Eosinophils Absolute: 0.2 10*3/uL (ref 0.0–0.5)
Eosinophils Relative: 4 %
HCT: 30 % — ABNORMAL LOW (ref 39.0–52.0)
Hemoglobin: 10.1 g/dL — ABNORMAL LOW (ref 13.0–17.0)
Immature Granulocytes: 0 %
Lymphocytes Relative: 29 %
Lymphs Abs: 1.1 10*3/uL (ref 0.7–4.0)
MCH: 30.2 pg (ref 26.0–34.0)
MCHC: 33.7 g/dL (ref 30.0–36.0)
MCV: 89.8 fL (ref 80.0–100.0)
Monocytes Absolute: 0.4 10*3/uL (ref 0.1–1.0)
Monocytes Relative: 11 %
Neutro Abs: 2.1 10*3/uL (ref 1.7–7.7)
Neutrophils Relative %: 55 %
Platelet Count: 210 10*3/uL (ref 150–400)
RBC: 3.34 MIL/uL — ABNORMAL LOW (ref 4.22–5.81)
RDW: 15.9 % — ABNORMAL HIGH (ref 11.5–15.5)
WBC Count: 3.8 10*3/uL — ABNORMAL LOW (ref 4.0–10.5)
nRBC: 0 % (ref 0.0–0.2)

## 2022-11-03 LAB — CMP (CANCER CENTER ONLY)
ALT: 9 U/L (ref 0–44)
AST: 14 U/L — ABNORMAL LOW (ref 15–41)
Albumin: 3.6 g/dL (ref 3.5–5.0)
Alkaline Phosphatase: 69 U/L (ref 38–126)
Anion gap: 8 (ref 5–15)
BUN: 12 mg/dL (ref 8–23)
CO2: 29 mmol/L (ref 22–32)
Calcium: 8.2 mg/dL — ABNORMAL LOW (ref 8.9–10.3)
Chloride: 101 mmol/L (ref 98–111)
Creatinine: 1.69 mg/dL — ABNORMAL HIGH (ref 0.61–1.24)
GFR, Estimated: 41 mL/min — ABNORMAL LOW (ref >60)
Glucose, Bld: 103 mg/dL — ABNORMAL HIGH (ref 70–99)
Potassium: 3.3 mmol/L — ABNORMAL LOW (ref 3.5–5.1)
Sodium: 138 mmol/L (ref 135–145)
Total Bilirubin: 0.5 mg/dL (ref 0.3–1.2)
Total Protein: 6.1 g/dL — ABNORMAL LOW (ref 6.5–8.1)

## 2022-11-03 MED ORDER — NITROGLYCERIN 0.4 MG SL SUBL: 0.4000 mg | SUBLINGUAL_TABLET | SUBLINGUAL | 11 refills | Status: AC | PRN

## 2022-11-03 NOTE — Telephone Encounter (Signed)
*  STAT* If patient is at the pharmacy, call can be transferred to refill team.   1. Which medications need to be refilled? (please list name of each medication and dose if known) nitroGLYCERIN (NITROSTAT) 0.4 MG SL tablet (Expired)   2. Which pharmacy/location (including street and city if local pharmacy) is medication to be sent to?  [id number] - [place], [place] - [address]    3. Do they need a 30 day or 90 day supply? 90

## 2022-11-06 LAB — KAPPA/LAMBDA LIGHT CHAINS
Kappa free light chain: 45 mg/L — ABNORMAL HIGH (ref 3.3–19.4)
Kappa, lambda light chain ratio: 2.05 — ABNORMAL HIGH (ref 0.26–1.65)
Lambda free light chains: 21.9 mg/L (ref 5.7–26.3)

## 2022-11-08 LAB — MULTIPLE MYELOMA PANEL, SERUM
Albumin SerPl Elph-Mcnc: 3.1 g/dL (ref 2.9–4.4)
Albumin/Glob SerPl: 1.4 (ref 0.7–1.7)
Alpha 1: 0.3 g/dL (ref 0.0–0.4)
Alpha2 Glob SerPl Elph-Mcnc: 0.6 g/dL (ref 0.4–1.0)
B-Globulin SerPl Elph-Mcnc: 0.7 g/dL (ref 0.7–1.3)
Gamma Glob SerPl Elph-Mcnc: 0.6 g/dL (ref 0.4–1.8)
Globulin, Total: 2.3 g/dL (ref 2.2–3.9)
IgA: 165 mg/dL (ref 61–437)
IgG (Immunoglobin G), Serum: 689 mg/dL (ref 603–1613)
IgM (Immunoglobulin M), Srm: 13 mg/dL — ABNORMAL LOW (ref 15–143)
Total Protein ELP: 5.4 g/dL — ABNORMAL LOW (ref 6.0–8.5)

## 2022-11-10 ENCOUNTER — Encounter (HOSPITAL_COMMUNITY): Payer: Self-pay

## 2022-11-10 ENCOUNTER — Emergency Department (HOSPITAL_COMMUNITY): Payer: Medicare HMO

## 2022-11-10 ENCOUNTER — Other Ambulatory Visit: Payer: Self-pay

## 2022-11-10 ENCOUNTER — Inpatient Hospital Stay (HOSPITAL_COMMUNITY)
Admission: EM | Admit: 2022-11-10 | Discharge: 2022-11-14 | DRG: 641 | Disposition: A | Discharge status: Home / Self Care | Payer: Medicare HMO | Attending: Internal Medicine | Admitting: Internal Medicine

## 2022-11-10 DIAGNOSIS — I5032 Chronic diastolic (congestive) heart failure: Secondary | ICD-10-CM | POA: Diagnosis present

## 2022-11-10 DIAGNOSIS — N400 Enlarged prostate without lower urinary tract symptoms: Secondary | ICD-10-CM | POA: Diagnosis present

## 2022-11-10 DIAGNOSIS — E785 Hyperlipidemia, unspecified: Secondary | ICD-10-CM | POA: Diagnosis present

## 2022-11-10 DIAGNOSIS — I2510 Atherosclerotic heart disease of native coronary artery without angina pectoris: Secondary | ICD-10-CM | POA: Diagnosis present

## 2022-11-10 DIAGNOSIS — I110 Hypertensive heart disease with heart failure: Secondary | ICD-10-CM | POA: Diagnosis present

## 2022-11-10 DIAGNOSIS — Z79891 Long term (current) use of opiate analgesic: Secondary | ICD-10-CM

## 2022-11-10 DIAGNOSIS — E118 Type 2 diabetes mellitus with unspecified complications: Secondary | ICD-10-CM

## 2022-11-10 DIAGNOSIS — Z7984 Long term (current) use of oral hypoglycemic drugs: Secondary | ICD-10-CM

## 2022-11-10 DIAGNOSIS — Z86711 Personal history of pulmonary embolism: Secondary | ICD-10-CM

## 2022-11-10 DIAGNOSIS — Z7901 Long term (current) use of anticoagulants: Secondary | ICD-10-CM

## 2022-11-10 DIAGNOSIS — R197 Diarrhea, unspecified: Principal | ICD-10-CM

## 2022-11-10 DIAGNOSIS — C9001 Multiple myeloma in remission: Secondary | ICD-10-CM | POA: Diagnosis present

## 2022-11-10 DIAGNOSIS — E119 Type 2 diabetes mellitus without complications: Secondary | ICD-10-CM | POA: Diagnosis present

## 2022-11-10 DIAGNOSIS — J449 Chronic obstructive pulmonary disease, unspecified: Secondary | ICD-10-CM | POA: Diagnosis present

## 2022-11-10 DIAGNOSIS — K219 Gastro-esophageal reflux disease without esophagitis: Secondary | ICD-10-CM | POA: Diagnosis present

## 2022-11-10 DIAGNOSIS — Z87891 Personal history of nicotine dependence: Secondary | ICD-10-CM

## 2022-11-10 DIAGNOSIS — E876 Hypokalemia: Principal | ICD-10-CM | POA: Diagnosis present

## 2022-11-10 DIAGNOSIS — J849 Interstitial pulmonary disease, unspecified: Secondary | ICD-10-CM | POA: Diagnosis present

## 2022-11-10 DIAGNOSIS — Z79899 Other long term (current) drug therapy: Secondary | ICD-10-CM

## 2022-11-10 DIAGNOSIS — K529 Noninfective gastroenteritis and colitis, unspecified: Secondary | ICD-10-CM | POA: Diagnosis present

## 2022-11-10 DIAGNOSIS — R159 Full incontinence of feces: Secondary | ICD-10-CM | POA: Diagnosis present

## 2022-11-10 DIAGNOSIS — G8929 Other chronic pain: Secondary | ICD-10-CM | POA: Diagnosis present

## 2022-11-10 DIAGNOSIS — Z83438 Family history of other disorder of lipoprotein metabolism and other lipidemia: Secondary | ICD-10-CM

## 2022-11-10 DIAGNOSIS — C9000 Multiple myeloma not having achieved remission: Secondary | ICD-10-CM | POA: Diagnosis present

## 2022-11-10 DIAGNOSIS — Z7951 Long term (current) use of inhaled steroids: Secondary | ICD-10-CM

## 2022-11-10 DIAGNOSIS — Z8249 Family history of ischemic heart disease and other diseases of the circulatory system: Secondary | ICD-10-CM

## 2022-11-10 DIAGNOSIS — Z794 Long term (current) use of insulin: Secondary | ICD-10-CM

## 2022-11-10 DIAGNOSIS — I10 Essential (primary) hypertension: Secondary | ICD-10-CM | POA: Diagnosis present

## 2022-11-10 LAB — CBC WITH DIFFERENTIAL/PLATELET
Abs Immature Granulocytes: 0.01 10*3/uL (ref 0.00–0.07)
Basophils Absolute: 0.1 10*3/uL (ref 0.0–0.1)
Basophils Relative: 2 %
Eosinophils Absolute: 0.1 10*3/uL (ref 0.0–0.5)
Eosinophils Relative: 2 %
HCT: 34.5 % — ABNORMAL LOW (ref 39.0–52.0)
Hemoglobin: 11.3 g/dL — ABNORMAL LOW (ref 13.0–17.0)
Immature Granulocytes: 0 %
Lymphocytes Relative: 21 %
Lymphs Abs: 0.8 10*3/uL (ref 0.7–4.0)
MCH: 29.5 pg (ref 26.0–34.0)
MCHC: 32.8 g/dL (ref 30.0–36.0)
MCV: 90.1 fL (ref 80.0–100.0)
Monocytes Absolute: 0.6 10*3/uL (ref 0.1–1.0)
Monocytes Relative: 16 %
Neutro Abs: 2.4 10*3/uL (ref 1.7–7.7)
Neutrophils Relative %: 59 %
Platelets: 205 10*3/uL (ref 150–400)
RBC: 3.83 MIL/uL — ABNORMAL LOW (ref 4.22–5.81)
RDW: 16.7 % — ABNORMAL HIGH (ref 11.5–15.5)
WBC: 4.1 10*3/uL (ref 4.0–10.5)
nRBC: 0 % (ref 0.0–0.2)

## 2022-11-10 MED ORDER — MORPHINE SULFATE (PF) 4 MG/ML IV SOLN
4.0000 mg | Freq: Once | INTRAVENOUS | Status: AC
  Administered 2022-11-11: 4 mg via INTRAVENOUS
  Filled 2022-11-10: qty 1

## 2022-11-10 MED ORDER — SODIUM CHLORIDE 0.9 % IV BOLUS
1000.0000 mL | Freq: Once | INTRAVENOUS | Status: AC
  Administered 2022-11-11: 1000 mL via INTRAVENOUS

## 2022-11-10 NOTE — ED Provider Triage Note (Signed)
Emergency Medicine Provider Triage Evaluation Note  Offie Farrell. , a 77 y.o. male  was evaluated in triage.  Pt complains of lower abdominal pain and diarrhea.  He is also now experiencing chest pain and shortness of breath.  Denies nausea and vomiting.  He states his diarrhea is worse than before.  Denies fever, chills.    Review of Systems  Positive: As above Negative: As above  Physical Exam  BP 139/77 (BP Location: Left Arm)   Pulse 90   Temp 98.2 F (36.8 C) (Oral)   Resp (!) 21   Ht 6' (1.829 m)   Wt 93.1 kg   SpO2 100%   BMI 27.84 kg/m  Gen:   Awake, no distress   Resp:  Normal effort  MSK:   Moves extremities without difficulty  Other:  Patient appears uncomfortable while sitting upright  Medical Decision Making  Medically screening exam initiated at 6:34 PM.  Appropriate orders placed.  [name] was informed that the remainder of the evaluation will be completed by another provider, this initial triage assessment does not replace that evaluation, and the importance of remaining in the ED until their evaluation is complete.     Lenard Simmer, [place] 11/10/22 1840

## 2022-11-10 NOTE — ED Provider Notes (Signed)
[place] EMERGENCY DEPARTMENT AT [facility] Provider Note   CSN: [id number] Arrival date & time: 11/10/22  1804     History  Chief Complaint  Patient presents with   Abdominal Pain   Diarrhea   Chest Pain    James [name]. is a 77 y.o. male.past medical history significant for hypertension, previous pulmonary embolism on Eliquis, multiple myeloma, CHF, COPD, diabetes, and lupus.  The ER today complaining of abdominal pain and diarrhea.  He states has been going on for 2 months.  He admission in February for the same but states the pain has gotten better initially, but has been worse for the past week.  States he is having frequent stool incontinence because he cannot get to the bathroom quickly enough. He had a fall on 3/20 and fractured olecranon but was unable to follow-up with orthopedics due to not feeling well.  Missed his PCP appointment today as well due to not feeling well. He has been taking Imodium which she states has decreased his frequency of bowel movements to 4 times a day but still having diarrhea.  Has been compliant with taking his potassium 10 mg daily and is on chronic opioids   Abdominal Pain Associated symptoms: chest pain and diarrhea   Diarrhea Associated symptoms: abdominal pain   Chest Pain Associated symptoms: abdominal pain        Home Medications Prior to Admission medications   Medication Sig Start Date End Date Taking? Authorizing Provider  albuterol (VENTOLIN HFA) 108 (90 Base) MCG/ACT inhaler Inhale into the lungs. 03/20/22   [provider]  amLODipine (NORVASC) 10 MG tablet Take 1 tablet (10 mg total) by mouth every morning. 09/01/22   Burnadette Pop, MD  APIXABAN Everlene Balls) VTE STARTER PACK (  AND ) Take 2 tablets (10 mg total) twice daily for 4 days then continue taking 1 tablet ( 5 mg) twice daily 09/01/22   Burnadette Pop, MD  bismuth subsalicylate (PEPTO BISMOL) 262 MG/15ML suspension Take 30 mLs by mouth  every 6 (six) hours as needed for indigestion or diarrhea or loose stools.    [provider]  Calcium Carbonate-Vitamin D (CALCIUM 600+D PO) Take 1 tablet by mouth in the morning and at bedtime.    [provider]  colestipol (COLESTID) 1 g tablet Take 1 g by mouth 2 (two) times daily. 02/11/20   [provider]  dexamethasone (DECADRON) 4 MG tablet TAKE 5 TABLETS BY MOUTH ONCE A WEEK 04/17/22   Benjiman Core, MD  diphenhydrAMINE HCl (ZZZQUIL) 50 MG/30ML LIQD Take 30 mLs by mouth at bedtime as needed (sleep).    [provider]  Ferrous Fumarate (HEMOCYTE - 106 MG FE) 324 (106 Fe) MG TABS tablet Take 1 tablet by mouth 2 (two) times daily. 02/15/21   [provider]  finasteride (PROSCAR) 5 MG tablet Take 5 mg by mouth daily.    [provider]  furosemide (LASIX) 20 MG tablet TAKE 1 TABLET BY MOUTH EVERY DAY 08/08/22   Omar Person, MD  gabapentin (NEURONTIN) 400 MG capsule Take 400-800 mg by mouth See admin instructions. Take  at lunch time and  2 Capsules (800 mg) at bedtime 08/26/15   [provider]  HYDROcodone-acetaminophen (NORCO) 10-325 MG tablet Take 1 tablet by mouth in the morning, at noon, and at bedtime. 09/16/16   [provider]  insulin degludec (TRESIBA FLEXTOUCH) 100 UNIT/ML FlexTouch Pen Inject into the skin daily.    [provider]  lenalidomide (REVLIMID) 15 MG capsule Take 1 capsule (15 mg total) by mouth daily. Take 1 capsule for 21 days and then off for 7 days. Discard capsules already on hand. 10/02/22   Jaci Standard, MD  lidocaine (LIDODERM) 5 % Place 1 patch onto the skin daily. Remove & Discard patch within 12 hours or as directed by MD 01/02/22   Virgina Norfolk, DO  loperamide (IMODIUM A-D) 2 MG tablet Take 1 tablet (2 mg total) by mouth 3 (three) times daily as needed for diarrhea or loose stools. 09/01/22   Burnadette Pop, MD  losartan (COZAAR) 50 MG tablet Take 1 tablet (50 mg total)  by mouth every morning. 09/01/22   Burnadette Pop, MD  morphine (MS CONTIN) 30 MG 12 hr tablet Take by mouth. 07/20/22   [provider]  oxyCODONE-acetaminophen (PERCOCET/ROXICET) 5-325 MG tablet Take 1 tablet by mouth every 6 (six) hours as needed for severe pain. 10/18/22   Tegeler, Canary Brim, MD  pantoprazole (PROTONIX) 40 MG tablet Take 40 mg by mouth 2 (two) times daily. 12/14/20   [provider]  pioglitazone (ACTOS) 15 MG tablet Take 15 mg by mouth in the morning. 01/13/21   [provider]  potassium chloride SA (KLOR-CON M) 20 MEQ tablet Take 1 tablet (20 mEq total) by mouth daily. Take 2 pills daily for 5 days then continue taking 1 pill daily 09/02/22   Burnadette Pop, MD  RESTASIS 0.05 % ophthalmic emulsion Place 1 drop into both eyes 2 (two) times daily as needed for dry eyes. 11/27/19   [provider]  Tamsulosin HCl (FLOMAX) 0.4 MG CAPS Take 0.4 mg by mouth 2 (two) times daily.     [provider]  tiZANidine (ZANAFLEX) 4 MG tablet Take 1 tablet (4 mg total) by mouth every 8 (eight) hours as needed for muscle spasms. 09/01/22   Burnadette Pop, MD  traMADol (ULTRAM) 50 MG tablet Take 50 mg by mouth every 6 (six) hours as needed for moderate pain or severe pain.    [provider]  Dwyane Luo 200-62.5-25 MCG/ACT AEPB Take 1 puff by mouth 2 (two) times daily. 09/01/22   Burnadette Pop, MD  vitamin B-12 (CYANOCOBALAMIN) 1000 MCG tablet Take 1,000 mcg by mouth in the morning and at bedtime.    [provider]  zolpidem (AMBIEN) 5 MG tablet Take 1 tablet (5 mg total) by mouth at bedtime as needed for sleep. 09/01/22   Burnadette Pop, MD  terazosin (HYTRIN) 5 MG capsule Take 5 mg by mouth 2 (two) times daily.   10/25/11  [provider]      Allergies    Invokana [canagliflozin] and Ace inhibitors    Review of Systems   Review of Systems  Cardiovascular:  Positive for chest pain.  Gastrointestinal:  Positive for  abdominal pain and diarrhea.    Physical Exam Updated Vital Signs BP (!) 156/90   Pulse 82   Temp 98.4 F (36.9 C)   Resp 15   Ht 6' (1.829 m)   Wt 93.1 kg   SpO2 97%   BMI 27.84 kg/m  Physical Exam Vitals and nursing note reviewed.  Constitutional:      General: He is not in acute distress.    Appearance: He is well-developed.  HENT:     Head: Normocephalic and atraumatic.  Eyes:     Conjunctiva/sclera: Conjunctivae normal.  Cardiovascular:     Rate and Rhythm: Normal rate and regular rhythm.  Heart sounds: No murmur heard. Pulmonary:     Effort: Pulmonary effort is normal. No respiratory distress.     Breath sounds: Normal breath sounds.  Abdominal:     General: Abdomen is flat. There is no distension.     Palpations: Abdomen is soft.     Tenderness: There is generalized abdominal tenderness.  Musculoskeletal:        General: No swelling.     Cervical back: Neck supple.  Skin:    General: Skin is warm and dry.     Capillary Refill: Capillary refill takes less than 2 seconds.  Neurological:     General: No focal deficit present.     Mental Status: He is alert.  Psychiatric:        Mood and Affect: Mood normal.     ED Results / Procedures / Treatments   Labs (all labs ordered are listed, but only abnormal results are displayed) Labs Reviewed  COMPREHENSIVE METABOLIC PANEL - Abnormal; Notable for the following components:      Result Value   Potassium 2.6 (*)    Glucose, Bld 137 (*)    Creatinine, Ser 1.36 (*)    Calcium 8.5 (*)    GFR, Estimated 53 (*)    All other components within normal limits  CBC WITH DIFFERENTIAL/PLATELET - Abnormal; Notable for the following components:   RBC 3.83 (*)    Hemoglobin 11.3 (*)    HCT 34.5 (*)    RDW 16.7 (*)    All other components within normal limits  C DIFFICILE QUICK SCREEN W PCR REFLEX    GASTROINTESTINAL PANEL BY PCR, STOOL (REPLACES STOOL CULTURE)  LIPASE, BLOOD  TROPONIN I (HIGH SENSITIVITY)   TROPONIN I (HIGH SENSITIVITY)    EKG EKG Interpretation  Date/Time:  Friday November 10 2022 18:20:46 EDT Ventricular Rate:  85 PR Interval:  144 QRS Duration: 139 QT Interval:  423 QTC Calculation: 503 R Axis:   -65 Text Interpretation: Sinus rhythm RBBB and LAFB Left ventricular hypertrophy Confirmed by Tilden Fossa [phone]) on 11/11/2022 12:36:58 AM  Radiology CT ABDOMEN PELVIS W CONTRAST  Result Date: 11/11/2022 CLINICAL DATA:  Abdominal pain, acute, nonlocalized. EXAM: CT ABDOMEN AND PELVIS WITH CONTRAST TECHNIQUE: Multidetector CT imaging of the abdomen and pelvis was performed using the standard protocol following bolus administration of intravenous contrast. RADIATION DOSE REDUCTION: This exam was performed according to the departmental dose-optimization program which includes automated exposure control, adjustment of the mA and/or kV according to patient size and/or use of iterative reconstruction technique. CONTRAST:  OMNIPAQUE IOHEXOL 300 MG/ML  SOLN COMPARISON:  08/19/2022. FINDINGS: Lower chest: Heart is enlarged and there is a trace pericardial effusion. Coronary artery calcifications are noted. Atelectasis or scarring is noted at the lung bases. There is a stable 6 mm nodule in the right middle lobe. Bronchiectasis with subpleural fibrosis and honeycombing is noted at the lung bases. Hepatobiliary: Stable scattered hypodensities are present in the liver, the largest are cystic in attenuation. The gallbladder is surgically absent. No biliary ductal dilatation. Pancreas: Unremarkable. No pancreatic ductal dilatation or surrounding inflammatory changes. Spleen: Normal in size without focal abnormality. Adrenals/Urinary Tract: The adrenal glands are within normal limits. The kidneys enhance symmetrically. Stable hypodensities are present in the kidneys bilaterally, the largest cystic in attenuation. 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 Additional subcentimeter hypodensities are noted which are too small to further characterize. No renal calculus or hydronephrosis. The  bladder is unremarkable. Stomach/Bowel: Stomach is within normal limits. Appendix appears normal. No bowel obstruction, free air or pneumatosis. There is mild thickening of the walls of the ascending colon. Vascular/Lymphatic: Aortic atherosclerosis. No enlarged abdominal or pelvic lymph nodes. Reproductive: Prostate is unremarkable. Other: No abdominopelvic ascites. Musculoskeletal: There is a stable compression deformity at L1. A stable compression deformity is noted in the superior endplate at L4. There is stable mild anterior wedging of the L2 vertebral body. Degenerative changes are present in the thoracic spine. No acute osseous abnormality. IMPRESSION: 1. Mild thickening of the wall of the ascending colon, which may be in part due to underdistention versus colitis. 2. Stable 6 mm nodule in the right middle lobe. 3. Stable fibrotic changes at the lung bases. 4. Coronary artery calcifications and a aortic atherosclerosis. 5. Remaining incidental findings as described above. Electronically Signed   By: Thornell Sartorius M.D.   On: 11/11/2022 01:23   DG Chest 2 View  Result Date: 11/10/2022 CLINICAL DATA:  Chest pain, shortness of breath EXAM: CHEST - 2 VIEW COMPARISON:  08/23/2022 FINDINGS: Lungs are essentially clear. No focal consolidation. No pleural effusion or pneumothorax. Chronic blunting of the left costophrenic angle likely reflects mild subpleural scarring when correlating with prior CT. The heart is normal in size. Degenerative changes of the visualized thoracolumbar spine. IMPRESSION: Normal chest radiographs. Electronically Signed   By: Charline Bills M.D.   On: 11/10/2022 19:14    Procedures Procedures    Medications Ordered in ED Medications  morphine (PF) 4 MG/ML injection 4 mg (has no administration in time range)  morphine (PF) 4  MG/ML injection 4 mg (4 mg Intravenous Given 11/11/22 0035)  sodium chloride 0.9 % bolus 1,000 mL (1,000 mLs Intravenous New Bag/Given 11/11/22 0039)  potassium chloride 10 mEq in 100 mL IVPB (10 mEq Intravenous New Bag/Given 11/11/22 0205)  iohexol (OMNIPAQUE) 300 MG/ML solution 100 mL (100 mLs Intravenous Contrast Given 11/11/22 0102)  potassium chloride SA (KLOR-CON M) CR tablet 40 mEq (40 mEq Oral Given 11/11/22 0238)    ED Course/ Medical Decision Making/ A&P Clinical Course as of 11/11/22 0600  Sat Nov 11, 2022  0344 Discussed with hospitalist for possible admission for significant hypokalemia with diarrhea, possible colitis.  Patient has not been able to provide stool sample here, she feels would be appropriate to continue replating potassium and recheck labs, if improving can likely go home and follow-up with GI. [CB]    Clinical Course User Index [CB] Ma Rings, PA-C                             Medical Decision Making This patient presents to the ED for concern of abdominal pain and diarrhea, feeling generally unwell worse over the past week, this involves an extensive number of treatment options, and is a complaint that carries with it a high risk of complications and morbidity.  The differential diagnosis includes gastritis, gastroenteritis, appendicitis, cholecystitis, diverticulitis, DKA, nephrolithiasis, gastroparesis, other    Co morbidities that complicate the patient evaluation  Pulm myeloma in remission, chronic diarrhea   Additional history obtained:  Additional history obtained from EMR External records from outside source obtained and reviewed including outpatient oncology notes and prior discharge summary   Lab Tests:  I Ordered, and personally interpreted labs.  The pertinent results include: Potassium 2.6   Imaging Studies ordered:  I ordered imaging studies including the CT abdomen pelvis chest x-ray  I independently visualized and interpreted  imaging which showed no acute findings I agree with the radiologist interpretation   Cardiac Monitoring: / EKG:  The patient was maintained on a cardiac monitor.  I personally viewed and interpreted the cardiac monitored which showed an underlying rhythm of: Sinus rhythm   Consultations Obtained:  I requested consultation with the hospitalist,  and discussed lab and imaging findings as well as pertinent plan - they recommend: Labs after replacement   Problem List / ED Course / Critical interventions / Medication management  Patient having abdominal pain and diarrhea and feeling unwell, history of chronic diarrhea he has been taking Imodium.  Not able to provide stool sample here, labs show significant hypokalemia, magnesium pending, he repleting his potassium.  Discussed with hospitalist about admission but they advised that he seems to be stable, no AKI, he is able to tolerate p.o.  We will repeat his labs after replacement.  Signed out to oncoming team.  No also has history of PE, no pleuritic pain today, he is compliant with his anticoagulation.  No indication for repeat CTA. EKG nonischemic.  I have reviewed the patients home medicines and have made adjustments as needed    Amount and/or Complexity of Data Reviewed Labs: ordered. Radiology: ordered.  Risk Prescription drug management.           Final Clinical Impression(s) / ED Diagnoses Final diagnoses:  Diarrhea, unspecified type  Hypokalemia    Rx / DC Orders ED Discharge Orders     None         Josem Kaufmann 11/11/22 [id number]    Tilden Fossa, MD 11/11/22 [phone]

## 2022-11-10 NOTE — ED Provider Notes (Incomplete)
[place] EMERGENCY DEPARTMENT AT [facility] Provider Note   CSN: [id number] Arrival date & time: 11/10/22  1804     History {Add pertinent medical, surgical, social history, OB history to HPI:1} Chief Complaint  Patient presents with  . Abdominal Pain  . Diarrhea  . Chest Pain    James [name]. is a 77 y.o. male.past medical history significant for hypertension, previous pulmonary embolism on Eliquis, multiple myeloma, CHF, COPD, diabetes, and lupus.  The ER today complaining of abdominal pain and diarrhea.  He states has been going on for 2 months.  He admission in February for the same but states the pain has gotten better initially, but has been worse for the past week.  States he is having frequent stool incontinence because he cannot get to the bathroom quickly enough. He had a fall on 3/20 and fractured olecranon but was unable to follow-up with orthopedics due to not feeling well.  Missed his PCP appointment today as well due to not feeling well.   Abdominal Pain Associated symptoms: chest pain and diarrhea   Diarrhea Associated symptoms: abdominal pain   Chest Pain Associated symptoms: abdominal pain        Home Medications Prior to Admission medications   Medication Sig Start Date End Date Taking? Authorizing Provider  albuterol (VENTOLIN HFA) 108 (90 Base) MCG/ACT inhaler Inhale into the lungs. 03/20/22   [provider]  amLODipine (NORVASC) 10 MG tablet Take 1 tablet (10 mg total) by mouth every morning. 09/01/22   Burnadette Pop, MD  APIXABAN Everlene Balls) VTE STARTER PACK (10MG  AND 5MG ) Take 2 tablets (10 mg total) twice daily for 4 days then continue taking 1 tablet ( 5 mg) twice daily 09/01/22   Burnadette Pop, MD  bismuth subsalicylate (PEPTO BISMOL) 262 MG/15ML suspension Take 30 mLs by mouth every 6 (six) hours as needed for indigestion or diarrhea or loose stools.    [provider]  Calcium Carbonate-Vitamin D (CALCIUM 600+D  PO) Take 1 tablet by mouth in the morning and at bedtime.    [provider]  colestipol (COLESTID) 1 g tablet Take 1 g by mouth 2 (two) times daily. 02/11/20   [provider]  dexamethasone (DECADRON) 4 MG tablet TAKE 5 TABLETS BY MOUTH ONCE A WEEK 04/17/22   Benjiman Core, MD  diphenhydrAMINE HCl (ZZZQUIL) 50 MG/30ML LIQD Take 30 mLs by mouth at bedtime as needed (sleep).    [provider]  Ferrous Fumarate (HEMOCYTE - 106 MG FE) 324 (106 Fe) MG TABS tablet Take 1 tablet by mouth 2 (two) times daily. 02/15/21   [provider]  finasteride (PROSCAR) 5 MG tablet Take 5 mg by mouth daily.    [provider]  furosemide (LASIX) 20 MG tablet TAKE 1 TABLET BY MOUTH EVERY DAY 08/08/22   Omar Person, MD  gabapentin (NEURONTIN) 400 MG capsule Take 400-800 mg by mouth See admin instructions. Take 400mg  at lunch time and  2 Capsules (800 mg) at bedtime 08/26/15   [provider]  HYDROcodone-acetaminophen (NORCO) 10-325 MG tablet Take 1 tablet by mouth in the morning, at noon, and at bedtime. 09/16/16   [provider]  insulin degludec (TRESIBA FLEXTOUCH) 100 UNIT/ML FlexTouch Pen Inject into the skin daily.    [provider]  lenalidomide (REVLIMID) 15 MG capsule Take 1 capsule (15 mg total) by mouth daily. Take 1 capsule for 21 days and then off for 7 days. Discard capsules already  on hand. 10/02/22   Jaci Standard, MD  lidocaine (LIDODERM) 5 % Place 1 patch onto the skin daily. Remove & Discard patch within 12 hours or as directed by MD 01/02/22   Virgina Norfolk, DO  loperamide (IMODIUM A-D) 2 MG tablet Take 1 tablet (2 mg total) by mouth 3 (three) times daily as needed for diarrhea or loose stools. 09/01/22   Burnadette Pop, MD  losartan (COZAAR) 50 MG tablet Take 1 tablet (50 mg total) by mouth every morning. 09/01/22   Burnadette Pop, MD  morphine (MS CONTIN) 30 MG 12 hr tablet Take by mouth. 07/20/22   [provider]   oxyCODONE-acetaminophen (PERCOCET/ROXICET) 5-325 MG tablet Take 1 tablet by mouth every 6 (six) hours as needed for severe pain. 10/18/22   Tegeler, Canary Brim, MD  pantoprazole (PROTONIX) 40 MG tablet Take 40 mg by mouth 2 (two) times daily. 12/14/20   [provider]  pioglitazone (ACTOS) 15 MG tablet Take 15 mg by mouth in the morning. 01/13/21   [provider]  potassium chloride SA (KLOR-CON M) 20 MEQ tablet Take 1 tablet (20 mEq total) by mouth daily. Take 2 pills daily for 5 days then continue taking 1 pill daily 09/02/22   Burnadette Pop, MD  RESTASIS 0.05 % ophthalmic emulsion Place 1 drop into both eyes 2 (two) times daily as needed for dry eyes. 11/27/19   [provider]  Tamsulosin HCl (FLOMAX) 0.4 MG CAPS Take 0.4 mg by mouth 2 (two) times daily.     [provider]  tiZANidine (ZANAFLEX) 4 MG tablet Take 1 tablet (4 mg total) by mouth every 8 (eight) hours as needed for muscle spasms. 09/01/22   Burnadette Pop, MD  traMADol (ULTRAM) 50 MG tablet Take 50 mg by mouth every 6 (six) hours as needed for moderate pain or severe pain.    [provider]  Dwyane Luo 200-62.5-25 MCG/ACT AEPB Take 1 puff by mouth 2 (two) times daily. 09/01/22   Burnadette Pop, MD  vitamin B-12 (CYANOCOBALAMIN) 1000 MCG tablet Take 1,000 mcg by mouth in the morning and at bedtime.    [provider]  zolpidem (AMBIEN) 5 MG tablet Take 1 tablet (5 mg total) by mouth at bedtime as needed for sleep. 09/01/22   Burnadette Pop, MD  terazosin (HYTRIN) 5 MG capsule Take 5 mg by mouth 2 (two) times daily.   10/25/11  [provider]      Allergies    Invokana [canagliflozin] and Ace inhibitors    Review of Systems   Review of Systems  Cardiovascular:  Positive for chest pain.  Gastrointestinal:  Positive for abdominal pain and diarrhea.    Physical Exam Updated Vital Signs BP 131/69   Pulse 78   Temp 98.4 F (36.9 C)   Resp 17   Ht 6' (1.829  m)   Wt 93.1 kg   SpO2 99%   BMI 27.84 kg/m  Physical Exam  ED Results / Procedures / Treatments   Labs (all labs ordered are listed, but only abnormal results are displayed) Labs Reviewed  CBC WITH DIFFERENTIAL/PLATELET - Abnormal; Notable for the following components:      Result Value   RBC 3.83 (*)    Hemoglobin 11.3 (*)    HCT 34.5 (*)    RDW 16.7 (*)    All other components within normal limits  COMPREHENSIVE METABOLIC PANEL  LIPASE, BLOOD  TROPONIN I (HIGH SENSITIVITY)  TROPONIN I (HIGH SENSITIVITY)  EKG None  Radiology DG Chest 2 View  Result Date: 11/10/2022 CLINICAL DATA:  Chest pain, shortness of breath EXAM: CHEST - 2 VIEW COMPARISON:  08/23/2022 FINDINGS: Lungs are essentially clear. No focal consolidation. No pleural effusion or pneumothorax. Chronic blunting of the left costophrenic angle likely reflects mild subpleural scarring when correlating with prior CT. The heart is normal in size. Degenerative changes of the visualized thoracolumbar spine. IMPRESSION: Normal chest radiographs. Electronically Signed   By: Charline Bills M.D.   On: 11/10/2022 19:14    Procedures Procedures  {Document cardiac monitor, telemetry assessment procedure when appropriate:1}  Medications Ordered in ED Medications  morphine (PF) 4 MG/ML injection 4 mg (has no administration in time range)  sodium chloride 0.9 % bolus 1,000 mL (has no administration in time range)    ED Course/ Medical Decision Making/ A&P   {   Click here for ABCD2, HEART and other calculatorsREFRESH Note before signing :1}                          Medical Decision Making Amount and/or Complexity of Data Reviewed Radiology: ordered.  Risk Prescription drug management.   ***  {Document critical care time when appropriate:1} {Document review of labs and clinical decision tools ie heart score, Chads2Vasc2 etc:1}  {Document your independent review of radiology images, and any outside  records:1} {Document your discussion with family members, caretakers, and with consultants:1} {Document social determinants of health affecting pt's care:1} {Document your decision making why or why not admission, treatments were needed:1} Final Clinical Impression(s) / ED Diagnoses Final diagnoses:  None    Rx / DC Orders ED Discharge Orders     None

## 2022-11-10 NOTE — ED Triage Notes (Signed)
Lower abdominal pain for 2 weeks. Denies N/V. Pt was evaluated here and workup was negative according to pt.  Diarrhea for a week.  C/o left sided chest pain for 2 weeks/ C/o sob, dizziness.

## 2022-11-11 ENCOUNTER — Emergency Department (HOSPITAL_COMMUNITY): Payer: Medicare HMO

## 2022-11-11 DIAGNOSIS — K529 Noninfective gastroenteritis and colitis, unspecified: Secondary | ICD-10-CM

## 2022-11-11 LAB — BASIC METABOLIC PANEL
Anion gap: 5 (ref 5–15)
Anion gap: 10 (ref 5–15)
BUN: 6 mg/dL — ABNORMAL LOW (ref 8–23)
BUN: 5 mg/dL — ABNORMAL LOW (ref 8–23)
CO2: 27 mmol/L (ref 22–32)
CO2: 24 mmol/L (ref 22–32)
Calcium: 8 mg/dL — ABNORMAL LOW (ref 8.9–10.3)
Calcium: 8.8 mg/dL — ABNORMAL LOW (ref 8.9–10.3)
Chloride: 104 mmol/L (ref 98–111)
Chloride: 102 mmol/L (ref 98–111)
Creatinine, Ser: 0.96 mg/dL (ref 0.61–1.24)
Creatinine, Ser: 0.85 mg/dL (ref 0.61–1.24)
GFR, Estimated: >60 mL/min (ref >60)
GFR, Estimated: >60 mL/min (ref >60)
Glucose, Bld: 110 mg/dL — ABNORMAL HIGH (ref 70–99)
Glucose, Bld: 126 mg/dL — ABNORMAL HIGH (ref 70–99)
Potassium: 2.7 mmol/L — CL (ref 3.5–5.1)
Potassium: 2.7 mmol/L — CL (ref 3.5–5.1)
Sodium: 136 mmol/L (ref 135–145)
Sodium: 136 mmol/L (ref 135–145)

## 2022-11-11 LAB — GLUCOSE, CAPILLARY
Glucose-Capillary: 140 mg/dL — ABNORMAL HIGH (ref 70–99)
Glucose-Capillary: 129 mg/dL — ABNORMAL HIGH (ref 70–99)
Glucose-Capillary: 117 mg/dL — ABNORMAL HIGH (ref 70–99)

## 2022-11-11 LAB — COMPREHENSIVE METABOLIC PANEL
ALT: 12 U/L (ref 0–44)
AST: 15 U/L (ref 15–41)
Albumin: 3.7 g/dL (ref 3.5–5.0)
Alkaline Phosphatase: 76 U/L (ref 38–126)
Anion gap: 9 (ref 5–15)
BUN: 8 mg/dL (ref 8–23)
CO2: 28 mmol/L (ref 22–32)
Calcium: 8.5 mg/dL — ABNORMAL LOW (ref 8.9–10.3)
Chloride: 99 mmol/L (ref 98–111)
Creatinine, Ser: 1.36 mg/dL — ABNORMAL HIGH (ref 0.61–1.24)
GFR, Estimated: 53 mL/min — ABNORMAL LOW (ref >60)
Glucose, Bld: 137 mg/dL — ABNORMAL HIGH (ref 70–99)
Potassium: 2.6 mmol/L — CL (ref 3.5–5.1)
Sodium: 136 mmol/L (ref 135–145)
Total Bilirubin: 0.8 mg/dL (ref 0.3–1.2)
Total Protein: 6.6 g/dL (ref 6.5–8.1)

## 2022-11-11 LAB — CBC
HCT: 34 % — ABNORMAL LOW (ref 39.0–52.0)
Hemoglobin: 10.9 g/dL — ABNORMAL LOW (ref 13.0–17.0)
MCH: 29.2 pg (ref 26.0–34.0)
MCHC: 32.1 g/dL (ref 30.0–36.0)
MCV: 91.2 fL (ref 80.0–100.0)
Platelets: 215 10*3/uL (ref 150–400)
RBC: 3.73 MIL/uL — ABNORMAL LOW (ref 4.22–5.81)
RDW: 16.9 % — ABNORMAL HIGH (ref 11.5–15.5)
WBC: 4.2 10*3/uL (ref 4.0–10.5)
nRBC: 0 % (ref 0.0–0.2)

## 2022-11-11 LAB — LIPASE, BLOOD: Lipase: 25 U/L (ref 11–51)

## 2022-11-11 LAB — TROPONIN I (HIGH SENSITIVITY)
Troponin I (High Sensitivity): 13 ng/L (ref <18)
Troponin I (High Sensitivity): 14 ng/L (ref <18)

## 2022-11-11 LAB — MAGNESIUM: Magnesium: 1.5 mg/dL — ABNORMAL LOW (ref 1.7–2.4)

## 2022-11-11 MED ORDER — METOPROLOL TARTRATE 5 MG/5ML IV SOLN: 5.0000 mg | Freq: Four times a day (QID) | INTRAVENOUS | Status: DC | PRN

## 2022-11-11 MED ORDER — FINASTERIDE 5 MG PO TABS
5.0000 mg | ORAL_TABLET | Freq: Every day | ORAL | Status: DC
  Administered 2022-11-11 – 2022-11-14 (×4): 5 mg via ORAL
  Filled 2022-11-11 (×4): qty 1

## 2022-11-11 MED ORDER — POTASSIUM CHLORIDE CRYS ER 20 MEQ PO TBCR
40.0000 meq | EXTENDED_RELEASE_TABLET | Freq: Once | ORAL | Status: AC
  Administered 2022-11-11: 40 meq via ORAL
  Filled 2022-11-11: qty 2

## 2022-11-11 MED ORDER — APIXABAN (ELIQUIS) VTE STARTER PACK (10MG AND 5MG): 5.0000 mg | ORAL_TABLET | Freq: Two times a day (BID) | ORAL | Status: DC

## 2022-11-11 MED ORDER — ZOLPIDEM TARTRATE 5 MG PO TABS
5.0000 mg | ORAL_TABLET | Freq: Every evening | ORAL | Status: DC | PRN
  Administered 2022-11-12: 5 mg via ORAL
  Filled 2022-11-11: qty 1

## 2022-11-11 MED ORDER — ACETAMINOPHEN 325 MG PO TABS
650.0000 mg | ORAL_TABLET | Freq: Four times a day (QID) | ORAL | Status: DC | PRN
  Administered 2022-11-13: 650 mg via ORAL
  Filled 2022-11-11: qty 2

## 2022-11-11 MED ORDER — COLESTIPOL HCL 1 G PO TABS
1.0000 g | ORAL_TABLET | Freq: Two times a day (BID) | ORAL | Status: DC
  Administered 2022-11-11 – 2022-11-14 (×6): 1 g via ORAL
  Filled 2022-11-11 (×6): qty 1

## 2022-11-11 MED ORDER — PANTOPRAZOLE SODIUM 40 MG PO TBEC
40.0000 mg | DELAYED_RELEASE_TABLET | Freq: Two times a day (BID) | ORAL | Status: DC
  Administered 2022-11-11 – 2022-11-14 (×6): 40 mg via ORAL
  Filled 2022-11-11 (×6): qty 1

## 2022-11-11 MED ORDER — ENOXAPARIN SODIUM 40 MG/0.4ML IJ SOSY
40.0000 mg | PREFILLED_SYRINGE | INTRAMUSCULAR | Status: DC
Start: 2022-11-11 — End: 2022-11-11

## 2022-11-11 MED ORDER — MORPHINE SULFATE (PF) 4 MG/ML IV SOLN
4.0000 mg | Freq: Once | INTRAVENOUS | Status: AC
  Administered 2022-11-11: 4 mg via INTRAVENOUS
  Filled 2022-11-11: qty 1

## 2022-11-11 MED ORDER — BISMUTH SUBSALICYLATE 262 MG/15ML PO SUSP
30.0000 mL | Freq: Four times a day (QID) | ORAL | Status: DC | PRN
  Filled 2022-11-11: qty 118

## 2022-11-11 MED ORDER — LOSARTAN POTASSIUM 25 MG PO TABS
50.0000 mg | ORAL_TABLET | Freq: Every day | ORAL | Status: DC
  Administered 2022-11-11 – 2022-11-14 (×4): 50 mg via ORAL
  Filled 2022-11-11 (×4): qty 2

## 2022-11-11 MED ORDER — ALBUTEROL SULFATE (2.5 MG/3ML) 0.083% IN NEBU: 2.5000 mg | INHALATION_SOLUTION | RESPIRATORY_TRACT | Status: DC | PRN

## 2022-11-11 MED ORDER — MORPHINE SULFATE ER 30 MG PO TBCR
30.0000 mg | EXTENDED_RELEASE_TABLET | Freq: Two times a day (BID) | ORAL | Status: DC
  Administered 2022-11-11 – 2022-11-14 (×6): 30 mg via ORAL
  Filled 2022-11-11 (×6): qty 1

## 2022-11-11 MED ORDER — LOPERAMIDE HCL 2 MG PO CAPS: 2.0000 mg | ORAL_CAPSULE | Freq: Three times a day (TID) | ORAL | Status: DC | PRN

## 2022-11-11 MED ORDER — POTASSIUM CHLORIDE IN NACL 40-0.9 MEQ/L-% IV SOLN
INTRAVENOUS | Status: DC
  Filled 2022-11-11 (×2): qty 1000

## 2022-11-11 MED ORDER — FERROUS FUMARATE 324 (106 FE) MG PO TABS
1.0000 | ORAL_TABLET | Freq: Two times a day (BID) | ORAL | Status: DC
  Administered 2022-11-11 – 2022-11-14 (×6): 106 mg via ORAL
  Filled 2022-11-11 (×7): qty 1

## 2022-11-11 MED ORDER — IOHEXOL 300 MG/ML  SOLN
100.0000 mL | Freq: Once | INTRAMUSCULAR | Status: AC | PRN
  Administered 2022-11-11: 100 mL via INTRAVENOUS

## 2022-11-11 MED ORDER — SODIUM CHLORIDE (PF) 0.9 % IJ SOLN
INTRAMUSCULAR | Status: AC
  Filled 2022-11-11: qty 50

## 2022-11-11 MED ORDER — TAMSULOSIN HCL 0.4 MG PO CAPS
0.4000 mg | ORAL_CAPSULE | Freq: Two times a day (BID) | ORAL | Status: DC
  Administered 2022-11-11 – 2022-11-14 (×6): 0.4 mg via ORAL
  Filled 2022-11-11 (×6): qty 1

## 2022-11-11 MED ORDER — INSULIN ASPART 100 UNIT/ML IJ SOLN
0.0000 [IU] | Freq: Every day | INTRAMUSCULAR | Status: DC
  Filled 2022-11-11: qty 0.05

## 2022-11-11 MED ORDER — AMLODIPINE BESYLATE 5 MG PO TABS
10.0000 mg | ORAL_TABLET | Freq: Every day | ORAL | Status: DC
  Administered 2022-11-11 – 2022-11-14 (×4): 10 mg via ORAL
  Filled 2022-11-11 (×4): qty 2

## 2022-11-11 MED ORDER — POTASSIUM CHLORIDE 10 MEQ/100ML IV SOLN
10.0000 meq | INTRAVENOUS | Status: AC
  Administered 2022-11-11 (×2): 10 meq via INTRAVENOUS
  Filled 2022-11-11 (×2): qty 100

## 2022-11-11 MED ORDER — MAGNESIUM SULFATE 2 GM/50ML IV SOLN
2.0000 g | Freq: Once | INTRAVENOUS | Status: AC
  Administered 2022-11-11: 2 g via INTRAVENOUS
  Filled 2022-11-11: qty 50

## 2022-11-11 MED ORDER — CYCLOSPORINE 0.05 % OP EMUL: 1.0000 [drp] | Freq: Two times a day (BID) | OPHTHALMIC | Status: DC | PRN

## 2022-11-11 MED ORDER — INSULIN ASPART 100 UNIT/ML IJ SOLN
0.0000 [IU] | Freq: Three times a day (TID) | INTRAMUSCULAR | Status: DC
  Administered 2022-11-11 – 2022-11-12 (×2): 2 [IU] via SUBCUTANEOUS
  Administered 2022-11-13: 3 [IU] via SUBCUTANEOUS
  Administered 2022-11-13: 2 [IU] via SUBCUTANEOUS
  Administered 2022-11-14: 5 [IU] via SUBCUTANEOUS
  Filled 2022-11-11: qty 0.15

## 2022-11-11 MED ORDER — APIXABAN 5 MG PO TABS
5.0000 mg | ORAL_TABLET | Freq: Two times a day (BID) | ORAL | Status: DC
  Administered 2022-11-11 – 2022-11-14 (×6): 5 mg via ORAL
  Filled 2022-11-11 (×6): qty 1

## 2022-11-11 MED ORDER — POTASSIUM CHLORIDE CRYS ER 20 MEQ PO TBCR
40.0000 meq | EXTENDED_RELEASE_TABLET | ORAL | Status: AC
  Administered 2022-11-11 (×2): 40 meq via ORAL
  Filled 2022-11-11 (×2): qty 2

## 2022-11-11 MED ORDER — OXYCODONE-ACETAMINOPHEN 5-325 MG PO TABS
1.0000 | ORAL_TABLET | Freq: Four times a day (QID) | ORAL | Status: DC | PRN
  Administered 2022-11-11 – 2022-11-14 (×4): 1 via ORAL
  Filled 2022-11-11 (×4): qty 1

## 2022-11-11 MED ORDER — ACETAMINOPHEN 650 MG RE SUPP: 650.0000 mg | Freq: Four times a day (QID) | RECTAL | Status: DC | PRN

## 2022-11-11 NOTE — H&P (Signed)
History and Physical  James [name]. ZOX:[id number] DOB: 08-26-1945 DOA: 11/10/2022  PCP: Wilmer Floor, NP   Chief Complaint: diarrhea   HPI: James [name]. is a 77 y.o. male with medical history significant for insulin-dependent type 2 diabetes, multiple myeloma in remission, on Eliquis for recently diagnosed PE who presents with acute on chronic diarrhea and resultant hypokalemia and hypomagnesemia.  It seems the patient has a longstanding history of chronic diarrhea, has been seen by GI about 5 years ago, maintained on colestipol, as well as as needed Imodium.  He was recently admitted to this facility in early February, for frequent diarrhea and discharged after electrolyte replacement.  Patient tells me that for the past week or so, he has been having increased frequency of diarrhea.  Up to 4-5 times a day, denies significant abdominal pain, no nausea, able to eat.  Denies any fevers, chills, chest pain, no blood in the stool.  He takes his medications in the bubble pack, says that the colestipol is not included in his last packet of medications.  ED Course: Vital signs in the ER were unremarkable except for some hypertension, lab work was done shows chronic anemia, no leukocytosis, normal renal function with potassium 2.7 and magnesium 1.5.  He was given oral potassium, and IV magnesium replacement has been initiated.  Since he has persistent electrolyte derangement this morning, will admit for observation to the hospitalist service.  Review of Systems: Please see HPI for pertinent positives and negatives. A complete 10 system review of systems are otherwise negative.  Past Medical History:  Diagnosis Date   Anemia    Arthritis    BPH (benign prostatic hyperplasia)    CHF (congestive heart failure)    Chronic pain    Diabetes mellitus    Dyspnea    GERD (gastroesophageal reflux disease)    Headache(784.0)    migraines, sinus headaches   Hepatitis    C and B-treated    HLD (hyperlipidemia)    Hypertension    Leukemia    Lupus    Multiple myeloma    Pneumonia 07/2011   Sleep apnea 03/2018   going for fitting on 04-29-18   Thyroid disease    goiter   Urinary frequency    Past Surgical History:  Procedure Laterality Date   ABDOMINAL SURGERY     BACK SURGERY     x 5    BIOPSY  01/02/2020   Procedure: BIOPSY;  Surgeon: Jeani Hawking, MD;  Location: [facility];  Service: Endoscopy;;   CHOLECYSTECTOMY     COLONOSCOPY     COLONOSCOPY WITH PROPOFOL N/A 01/02/2020   Procedure: COLONOSCOPY WITH PROPOFOL;  Surgeon: Jeani Hawking, MD;  Location: [facility];  Service: Endoscopy;  Laterality: N/A;   cyst removal skull  20 years ago   ETHMOIDECTOMY  10/12/2011   Procedure: ETHMOIDECTOMY;  Surgeon: Keturah Barre, MD;  Location: [facility] OR;  Service: ENT;  Laterality: Bilateral;  bilateral maxillary sinus osteal enlargement, frontal sinusotomy   EYE SURGERY     bilat cataract with lens implants   HAND SURGERY     right finger   HERNIA REPAIR     MICROLARYNGOSCOPY N/A 02/18/2021   Procedure: MICROLARYNGOSCOPY with Excisional Biopsy;  Surgeon: Osborn Coho, MD;  Location: [facility];  Service: ENT;  Laterality: N/A;   POLYPECTOMY  01/02/2020   Procedure: POLYPECTOMY;  Surgeon: Jeani Hawking, MD;  Location: [facility];  Service: Endoscopy;;   ROTATOR CUFF REPAIR  bilateral   SHOULDER ARTHROSCOPY WITH ROTATOR CUFF REPAIR Left 05/18/2014   Procedure: SHOULDER ARTHROSCOPY WITH ROTATOR CUFF REPAIR;  Surgeon: Mable Paris, MD;  Location: [facility];  Service: Orthopedics;  Laterality: Left;  Left shoulder arthroscopy rotator cuff repair, subacromial decompression   sinus surgery     TONSILLECTOMY     TRIGGER FINGER RELEASE Right 05/02/2018   Procedure: RELEASE TRIGGER FINGER/A-1 PULLEY RIGHT SMALL FINGER;  Surgeon: Betha Loa, MD;  Location: [facility];  Service: Orthopedics;  Laterality: Right;    Social History:  reports  that he quit smoking about 38 years ago. His smoking use included cigarettes. He has a 26.00 pack-year smoking history. He has never used smokeless tobacco. He reports that he does not currently use drugs after having used the following drugs: Heroin. He reports that he does not drink alcohol.   Allergies  Allergen Reactions   Invokana [Canagliflozin] Anaphylaxis    Facial/neck/ lip swelling   Ace Inhibitors Cough    Family History  Problem Relation Age of Onset   Hyperlipidemia Mother    Hypertension Mother      Prior to Admission medications   Medication Sig Start Date End Date Taking? Authorizing Provider  albuterol (VENTOLIN HFA) 108 (90 Base) MCG/ACT inhaler Inhale into the lungs. 03/20/22   [provider]  amLODipine (NORVASC) 10 MG tablet Take 1 tablet (10 mg total) by mouth every morning. 09/01/22   Burnadette Pop, MD  APIXABAN Everlene Balls) VTE STARTER PACK (  AND ) Take 2 tablets (10 mg total) twice daily for 4 days then continue taking 1 tablet ( 5 mg) twice daily 09/01/22   Burnadette Pop, MD  bismuth subsalicylate (PEPTO BISMOL) 262 MG/15ML suspension Take 30 mLs by mouth every 6 (six) hours as needed for indigestion or diarrhea or loose stools.    [provider]  Calcium Carbonate-Vitamin D (CALCIUM 600+D PO) Take 1 tablet by mouth in the morning and at bedtime.    [provider]  colestipol (COLESTID) 1 g tablet Take 1 g by mouth 2 (two) times daily. 02/11/20   [provider]  dexamethasone (DECADRON) 4 MG tablet TAKE 5 TABLETS BY MOUTH ONCE A WEEK 04/17/22   Benjiman Core, MD  diphenhydrAMINE HCl (ZZZQUIL) 50 MG/30ML LIQD Take 30 mLs by mouth at bedtime as needed (sleep).    [provider]  Ferrous Fumarate (HEMOCYTE - 106 MG FE) 324 (106 Fe) MG TABS tablet Take 1 tablet by mouth 2 (two) times daily. 02/15/21   [provider]  finasteride (PROSCAR) 5 MG tablet Take 5 mg by mouth daily.    [provider]   furosemide (LASIX) 20 MG tablet TAKE 1 TABLET BY MOUTH EVERY DAY 08/08/22   Omar Person, MD  gabapentin (NEURONTIN) 400 MG capsule Take 400-800 mg by mouth See admin instructions. Take  at lunch time and  2 Capsules (800 mg) at bedtime 08/26/15   [provider]  HYDROcodone-acetaminophen (NORCO) 10-325 MG tablet Take 1 tablet by mouth in the morning, at noon, and at bedtime. 09/16/16   [provider]  insulin degludec (TRESIBA FLEXTOUCH) 100 UNIT/ML FlexTouch Pen Inject into the skin daily.    [provider]  lenalidomide (REVLIMID) 15 MG capsule Take 1 capsule (15 mg total) by mouth daily. Take 1 capsule for 21 days and then off for 7 days. Discard capsules already on hand. 10/02/22   Jaci Standard, MD  lidocaine (LIDODERM) 5 %  Place 1 patch onto the skin daily. Remove & Discard patch within 12 hours or as directed by MD 01/02/22   Virgina Norfolk, DO  loperamide (IMODIUM A-D) 2 MG tablet Take 1 tablet (2 mg total) by mouth 3 (three) times daily as needed for diarrhea or loose stools. 09/01/22   Burnadette Pop, MD  losartan (COZAAR) 50 MG tablet Take 1 tablet (50 mg total) by mouth every morning. 09/01/22   Burnadette Pop, MD  morphine (MS CONTIN) 30 MG 12 hr tablet Take by mouth. 07/20/22   [provider]  oxyCODONE-acetaminophen (PERCOCET/ROXICET) 5-325 MG tablet Take 1 tablet by mouth every 6 (six) hours as needed for severe pain. 10/18/22   Tegeler, Canary Brim, MD  pantoprazole (PROTONIX) 40 MG tablet Take 40 mg by mouth 2 (two) times daily. 12/14/20   [provider]  pioglitazone (ACTOS) 15 MG tablet Take 15 mg by mouth in the morning. 01/13/21   [provider]  potassium chloride SA (KLOR-CON M) 20 MEQ tablet Take 1 tablet (20 mEq total) by mouth daily. Take 2 pills daily for 5 days then continue taking 1 pill daily 09/02/22   Burnadette Pop, MD  RESTASIS 0.05 % ophthalmic emulsion Place 1 drop into both eyes 2 (two) times daily as  needed for dry eyes. 11/27/19   [provider]  Tamsulosin HCl (FLOMAX) 0.4 MG CAPS Take 0.4 mg by mouth 2 (two) times daily.     [provider]  tiZANidine (ZANAFLEX) 4 MG tablet Take 1 tablet (4 mg total) by mouth every 8 (eight) hours as needed for muscle spasms. 09/01/22   Burnadette Pop, MD  traMADol (ULTRAM) 50 MG tablet Take 50 mg by mouth every 6 (six) hours as needed for moderate pain or severe pain.    [provider]  Dwyane Luo 200-62.5-25 MCG/ACT AEPB Take 1 puff by mouth 2 (two) times daily. 09/01/22   Burnadette Pop, MD  vitamin B-12 (CYANOCOBALAMIN) 1000 MCG tablet Take 1,000 mcg by mouth in the morning and at bedtime.    [provider]  zolpidem (AMBIEN) 5 MG tablet Take 1 tablet (5 mg total) by mouth at bedtime as needed for sleep. 09/01/22   Burnadette Pop, MD  terazosin (HYTRIN) 5 MG capsule Take 5 mg by mouth 2 (two) times daily.   10/25/11  [provider]    Physical Exam: BP (!) 150/78   Pulse 82   Temp 98.6 F (37 C) (Oral)   Resp (!) 28   Ht 6' (1.829 m)   Wt 93.1 kg   SpO2 94%   BMI 27.84 kg/m   General:  Alert, oriented, calm, in no acute distress, well-nourished well-developed gentleman appearing slightly younger than his stated age. Eyes: EOMI, clear conjuctivae, white sclerea Neck: supple, no masses, trachea mildline  Cardiovascular: RRR, no murmurs or rubs, no peripheral edema  Respiratory: clear to auscultation bilaterally, no wheezes, no crackles  Abdomen: soft, nontender, slightly distended, normal bowel tones heard  Skin: dry, no rashes  Musculoskeletal: no joint effusions, normal range of motion  Psychiatric: appropriate affect, normal speech  Neurologic: extraocular muscles intact, clear speech, moving all extremities with intact sensorium          Labs on Admission:  Basic Metabolic Panel: Recent Labs  Lab 11/10/22 2331 11/11/22 0644  NA 136 136  K 2.6* 2.7*  CL 99 104  CO2 28 27   GLUCOSE 137* 110*  BUN 8 6*  CREATININE 1.36* 0.96  CALCIUM  8.5* 8.0*  MG  --  1.5*   Liver Function Tests: Recent Labs  Lab 11/10/22 2331  AST 15  ALT 12  ALKPHOS 76  BILITOT 0.8  PROT 6.6  ALBUMIN 3.7   Recent Labs  Lab 11/10/22 2331  LIPASE 25   No results for input(s): "AMMONIA" in the last 168 hours. CBC: Recent Labs  Lab 11/10/22 2331  WBC 4.1  NEUTROABS 2.4  HGB 11.3*  HCT 34.5*  MCV 90.1  PLT 205   Cardiac Enzymes: No results for input(s): "CKTOTAL", "CKMB", "CKMBINDEX", "TROPONINI" in the last 168 hours.  BNP (last 3 results) Recent Labs    08/23/22 0022  BNP 103.0*    ProBNP (last 3 results) No results for input(s): "PROBNP" in the last 8760 hours.  CBG: No results for input(s): "GLUCAP" in the last 168 hours.  Radiological Exams on Admission: CT ABDOMEN PELVIS W CONTRAST  Result Date: 11/11/2022 CLINICAL DATA:  Abdominal pain, acute, nonlocalized. EXAM: CT ABDOMEN AND PELVIS WITH CONTRAST TECHNIQUE: Multidetector CT imaging of the abdomen and pelvis was performed using the standard protocol following bolus administration of intravenous contrast. RADIATION DOSE REDUCTION: This exam was performed according to the departmental dose-optimization program which includes automated exposure control, adjustment of the mA and/or kV according to patient size and/or use of iterative reconstruction technique. CONTRAST:  OMNIPAQUE IOHEXOL 300 MG/ML  SOLN COMPARISON:  08/19/2022. FINDINGS: Lower chest: Heart is enlarged and there is a trace pericardial effusion. Coronary artery calcifications are noted. Atelectasis or scarring is noted at the lung bases. There is a stable 6 mm nodule in the right middle lobe. Bronchiectasis with subpleural fibrosis and honeycombing is noted at the lung bases. Hepatobiliary: Stable scattered hypodensities are present in the liver, the largest are cystic in attenuation. The gallbladder is surgically absent. No biliary ductal  dilatation. Pancreas: Unremarkable. No pancreatic ductal dilatation or surrounding inflammatory changes. Spleen: Normal in size without focal abnormality. Adrenals/Urinary Tract: The adrenal glands are within normal limits. The kidneys enhance symmetrically. Stable hypodensities are present in the kidneys bilaterally, the largest cystic in attenuation. 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 Additional subcentimeter hypodensities are noted which are too small to further characterize. No renal calculus or hydronephrosis. The bladder is unremarkable. Stomach/Bowel: Stomach is within normal limits. Appendix appears normal. No bowel obstruction, free air or pneumatosis. There is mild thickening of the walls of the ascending colon. Vascular/Lymphatic: Aortic atherosclerosis. No enlarged abdominal or pelvic lymph nodes. Reproductive: Prostate is unremarkable. Other: No abdominopelvic ascites. Musculoskeletal: There is a stable compression deformity at L1. A stable compression deformity is noted in the superior endplate at L4. There is stable mild anterior wedging of the L2 vertebral body. Degenerative changes are present in the thoracic spine. No acute osseous abnormality. IMPRESSION: 1. Mild thickening of the wall of the ascending colon, which may be in part due to underdistention versus colitis. 2. Stable 6 mm nodule in the right middle lobe. 3. Stable fibrotic changes at the lung bases. 4. Coronary artery calcifications and a aortic atherosclerosis. 5. Remaining incidental findings as described above. Electronically Signed   By: Thornell Sartorius M.D.   On: 11/11/2022 01:23   DG Chest 2 View  Result Date: 11/10/2022 CLINICAL DATA:  Chest pain, shortness of breath EXAM: CHEST - 2 VIEW COMPARISON:  08/23/2022 FINDINGS: Lungs are essentially clear. No focal consolidation. No pleural effusion or pneumothorax. Chronic blunting of the left costophrenic angle  likely  reflects mild subpleural scarring when correlating with prior CT. The heart is normal in size. Degenerative changes of the visualized thoracolumbar spine. IMPRESSION: Normal chest radiographs. Electronically Signed   By: Charline Bills M.D.   On: 11/10/2022 19:14    Assessment/Plan Principal Problem: Acute on chronic diarrhea with resulting hypokalemia and hypomagnesemia-has chronic diarrhea, has not had any diarrhea since being here in the hospital.  Has been afebrile, no white count, or significant abdominal pain or other worrisome symptoms.  His chronic diarrhea was probably exacerbated by not having his colestipol, according to the patient. -Observation admission -Regular consistency diet as tolerated -Continue gentle IV fluids -IV magnesium replacement is ongoing -Will give another total of 80 mEq oral potassium later this morning -Recheck BMP and magnesium at 5 PM -Patient would benefit from outpatient GI consultation  Active Problems:   Chronic diastolic CHF (congestive heart failure)-no evidence of acute failure   COPD (chronic obstructive pulmonary disease)   Type 2 diabetes mellitus with complication, with long-term current use of insulin -Diabetic diet when eating -Moderate dose sliding scale insulin   Multiple myeloma-in remission, he will need to reschedule his outpatient appointment with Dr. Leonides Schanz History of PE-recently diagnosed, continue Eliquis   Coronary artery disease involving native coronary artery of native heart without angina pectoris   Essential hypertension  DVT prophylaxis: Eliquis    Code Status: Full Code  Consults called: None  Admission status: Observation  Time spent: 43 minutes  Finneas Mathe Sharlette Dense MD [facility] Pager [phone]  If 7PM-7AM, please contact night-coverage [email or web address] Password [facility]  11/11/2022, 8:43 AM

## 2022-11-11 NOTE — Discharge Instructions (Signed)
Seen today for diarrhea and low potassium, you are given potassium through the IV.  Your CT scan showed possibly mild colitis but this could also be due to your colon being distended.  I discussed with hospital medicine, will defer any antibiotic treatment to your PCP and/your GI doctor and you need to have stool studies since you are not able to provide them here.  Make sure you continue taking your potassium and increase potassium in your diet with foods like bananas, orange juice and potatoes.  Have your blood work rechecked in a couple of days.

## 2022-11-11 NOTE — ED Notes (Signed)
PA notified of K 2.7. 

## 2022-11-11 NOTE — ED Notes (Signed)
Pt states he is wet. Attempted to change pt linens and patient refused. States he will not move to allow [place] to change him because he does not want to potentially hurt his arm. Pt encouraged to allow for changing and pt adamant that he will not be changed at this time.

## 2022-11-11 NOTE — ED Provider Notes (Signed)
Accepted handoff at shift change from [facility], [place]. Please see prior provider note for more detail.   Briefly: Patient is 77 y.o. with hypertension, previous pulmonary embolism on Eliquis, multiple myeloma, CHF, COPD, diabetes, and lupus presenting for abdominal pain and diarrhea.  Workup revealed hypokalemic, been given oral/IV K, scan shows colitis and decompressed bowel, hospitalist said not to treat unless stool culture came back positive  DDX: concern for gastritis, gastroenteritis, appendicitis, cholecystitis, diverticulitis, DKA, nephrolithiasis, gastroparesis, other   Plan: recheck K/Mg and if improved, can go home and f/u with outpatient GI and PCP   Physical Exam  BP (!) 150/78   Pulse 82   Temp 98.6 F (37 C) (Oral)   Resp (!) 28   Ht 6' (1.829 m)   Wt 93.1 kg   SpO2 94%   BMI 27.84 kg/m   Physical Exam  Procedures  Procedures  ED Course / MDM   Clinical Course as of 11/11/22 0829  Sat Nov 11, 2022  0344 Discussed with hospitalist for possible admission for significant hypokalemia with diarrhea, possible colitis.  Patient has not been able to provide stool sample here, she feels would be appropriate to continue replating potassium and recheck labs, if improving can likely go home and follow-up with GI. [CB]  0828 Recheck of K found to be 2.7 and mag 1.5.  Started IV mag and admitted to hospital with Dr. Lonna Duval [JR]    Clinical Course User Index [CB] Ma Rings, PA-C [JR] Gareth Eagle, PA-C   Medical Decision Making Amount and/or Complexity of Data Reviewed Labs: ordered. Radiology: ordered.  Risk Prescription drug management.    Recheck of K found to be 2.7 and mag 1.5.  Started IV mag and admitted to hospital with Dr. Lonna Duval for hypokalemia      Gareth Eagle, PA-C 11/11/22 4462    Terald Sleeper, MD 11/11/22 [phone]

## 2022-11-12 DIAGNOSIS — Z83438 Family history of other disorder of lipoprotein metabolism and other lipidemia: Secondary | ICD-10-CM | POA: Diagnosis not present

## 2022-11-12 DIAGNOSIS — E119 Type 2 diabetes mellitus without complications: Secondary | ICD-10-CM | POA: Diagnosis present

## 2022-11-12 DIAGNOSIS — I110 Hypertensive heart disease with heart failure: Secondary | ICD-10-CM | POA: Diagnosis present

## 2022-11-12 DIAGNOSIS — C9001 Multiple myeloma in remission: Secondary | ICD-10-CM | POA: Diagnosis present

## 2022-11-12 DIAGNOSIS — K529 Noninfective gastroenteritis and colitis, unspecified: Secondary | ICD-10-CM | POA: Diagnosis not present

## 2022-11-12 DIAGNOSIS — Z86711 Personal history of pulmonary embolism: Secondary | ICD-10-CM | POA: Diagnosis not present

## 2022-11-12 DIAGNOSIS — Z79899 Other long term (current) drug therapy: Secondary | ICD-10-CM | POA: Diagnosis not present

## 2022-11-12 DIAGNOSIS — K219 Gastro-esophageal reflux disease without esophagitis: Secondary | ICD-10-CM | POA: Diagnosis present

## 2022-11-12 DIAGNOSIS — Z8249 Family history of ischemic heart disease and other diseases of the circulatory system: Secondary | ICD-10-CM | POA: Diagnosis not present

## 2022-11-12 DIAGNOSIS — E785 Hyperlipidemia, unspecified: Secondary | ICD-10-CM | POA: Diagnosis present

## 2022-11-12 DIAGNOSIS — Z794 Long term (current) use of insulin: Secondary | ICD-10-CM | POA: Diagnosis not present

## 2022-11-12 DIAGNOSIS — Z87891 Personal history of nicotine dependence: Secondary | ICD-10-CM | POA: Diagnosis not present

## 2022-11-12 DIAGNOSIS — J849 Interstitial pulmonary disease, unspecified: Secondary | ICD-10-CM | POA: Diagnosis present

## 2022-11-12 DIAGNOSIS — J449 Chronic obstructive pulmonary disease, unspecified: Secondary | ICD-10-CM | POA: Diagnosis present

## 2022-11-12 DIAGNOSIS — G8929 Other chronic pain: Secondary | ICD-10-CM | POA: Diagnosis present

## 2022-11-12 DIAGNOSIS — R159 Full incontinence of feces: Secondary | ICD-10-CM | POA: Diagnosis present

## 2022-11-12 DIAGNOSIS — Z7901 Long term (current) use of anticoagulants: Secondary | ICD-10-CM | POA: Diagnosis not present

## 2022-11-12 DIAGNOSIS — I5032 Chronic diastolic (congestive) heart failure: Secondary | ICD-10-CM | POA: Diagnosis present

## 2022-11-12 DIAGNOSIS — I2510 Atherosclerotic heart disease of native coronary artery without angina pectoris: Secondary | ICD-10-CM | POA: Diagnosis present

## 2022-11-12 DIAGNOSIS — N400 Enlarged prostate without lower urinary tract symptoms: Secondary | ICD-10-CM | POA: Diagnosis present

## 2022-11-12 DIAGNOSIS — Z79891 Long term (current) use of opiate analgesic: Secondary | ICD-10-CM | POA: Diagnosis not present

## 2022-11-12 DIAGNOSIS — E876 Hypokalemia: Secondary | ICD-10-CM | POA: Diagnosis present

## 2022-11-12 DIAGNOSIS — Z7984 Long term (current) use of oral hypoglycemic drugs: Secondary | ICD-10-CM | POA: Diagnosis not present

## 2022-11-12 DIAGNOSIS — Z7951 Long term (current) use of inhaled steroids: Secondary | ICD-10-CM | POA: Diagnosis not present

## 2022-11-12 LAB — COMPREHENSIVE METABOLIC PANEL
ALT: 10 U/L (ref 0–44)
AST: 17 U/L (ref 15–41)
Albumin: 3.8 g/dL (ref 3.5–5.0)
Alkaline Phosphatase: 87 U/L (ref 38–126)
Anion gap: 8 (ref 5–15)
BUN: 5 mg/dL — ABNORMAL LOW (ref 8–23)
CO2: 22 mmol/L (ref 22–32)
Calcium: 8.7 mg/dL — ABNORMAL LOW (ref 8.9–10.3)
Chloride: 105 mmol/L (ref 98–111)
Creatinine, Ser: 0.94 mg/dL (ref 0.61–1.24)
GFR, Estimated: >60 mL/min (ref >60)
Glucose, Bld: 121 mg/dL — ABNORMAL HIGH (ref 70–99)
Potassium: 2.8 mmol/L — ABNORMAL LOW (ref 3.5–5.1)
Sodium: 135 mmol/L (ref 135–145)
Total Bilirubin: 1 mg/dL (ref 0.3–1.2)
Total Protein: 6.7 g/dL (ref 6.5–8.1)

## 2022-11-12 LAB — PHOSPHORUS: Phosphorus: 2.9 mg/dL (ref 2.5–4.6)

## 2022-11-12 LAB — C DIFFICILE QUICK SCREEN W PCR REFLEX
C Diff antigen: NEGATIVE
C Diff interpretation: NOT DETECTED
C Diff toxin: NEGATIVE

## 2022-11-12 LAB — GLUCOSE, CAPILLARY
Glucose-Capillary: 119 mg/dL — ABNORMAL HIGH (ref 70–99)
Glucose-Capillary: 103 mg/dL — ABNORMAL HIGH (ref 70–99)
Glucose-Capillary: 134 mg/dL — ABNORMAL HIGH (ref 70–99)

## 2022-11-12 LAB — POTASSIUM: Potassium: 2.8 mmol/L — ABNORMAL LOW (ref 3.5–5.1)

## 2022-11-12 LAB — MAGNESIUM: Magnesium: 1.8 mg/dL (ref 1.7–2.4)

## 2022-11-12 MED ORDER — POTASSIUM CHLORIDE 10 MEQ/100ML IV SOLN
INTRAVENOUS | Status: AC
  Filled 2022-11-12: qty 100

## 2022-11-12 MED ORDER — SODIUM CHLORIDE 0.9 % IV SOLN: INTRAVENOUS | Status: DC

## 2022-11-12 MED ORDER — GUAIFENESIN 100 MG/5ML PO LIQD: 5.0000 mL | ORAL | Status: DC | PRN

## 2022-11-12 MED ORDER — POTASSIUM CHLORIDE 10 MEQ/100ML IV SOLN
10.0000 meq | INTRAVENOUS | Status: AC
  Administered 2022-11-12 (×4): 10 meq via INTRAVENOUS
  Filled 2022-11-12 (×4): qty 100

## 2022-11-12 MED ORDER — HYDRALAZINE HCL 20 MG/ML IJ SOLN: 10.0000 mg | INTRAMUSCULAR | Status: DC | PRN

## 2022-11-12 MED ORDER — TRAZODONE HCL 50 MG PO TABS: 50.0000 mg | ORAL_TABLET | Freq: Every evening | ORAL | Status: DC | PRN

## 2022-11-12 MED ORDER — SENNOSIDES-DOCUSATE SODIUM 8.6-50 MG PO TABS: 1.0000 | ORAL_TABLET | Freq: Every evening | ORAL | Status: DC | PRN

## 2022-11-12 MED ORDER — IPRATROPIUM-ALBUTEROL 0.5-2.5 (3) MG/3ML IN SOLN: 3.0000 mL | RESPIRATORY_TRACT | Status: DC | PRN

## 2022-11-12 MED ORDER — ONDANSETRON HCL 4 MG/2ML IJ SOLN: 4.0000 mg | Freq: Four times a day (QID) | INTRAMUSCULAR | Status: DC | PRN

## 2022-11-12 MED ORDER — POTASSIUM CHLORIDE 10 MEQ/100ML IV SOLN
10.0000 meq | INTRAVENOUS | Status: AC
  Administered 2022-11-12 – 2022-11-13 (×4): 10 meq via INTRAVENOUS
  Filled 2022-11-12 (×4): qty 100

## 2022-11-12 MED ORDER — METOPROLOL TARTRATE 5 MG/5ML IV SOLN: 5.0000 mg | INTRAVENOUS | Status: DC | PRN

## 2022-11-12 MED ORDER — SODIUM CHLORIDE 0.9 % IV SOLN: INTRAVENOUS | Status: AC

## 2022-11-12 NOTE — Progress Notes (Signed)
Notified on-call Provider about potassium of 2.8. New order of 4 runs of IV potassium received. Awaiting pharmacy to verify

## 2022-11-12 NOTE — Progress Notes (Signed)
  Transition of Care [facility]) Screening Note   Patient Details  Name: James [name]. Date of Birth: 04-25-1946   Transition of Care [facility]) CM/SW Contact:    Adrian Prows, RN Phone Number: 11/12/2022, 12:26 PM    Transition of Care Department [facility]) has reviewed patient and no TOC needs have been identified at this time. We will continue to monitor patient advancement through interdisciplinary progression rounds. If new patient transition needs arise, please place a TOC consult.

## 2022-11-12 NOTE — Progress Notes (Signed)
PROGRESS NOTE    [name]  UQJ:[id number] DOB: April 05, 1946 DOA: 11/10/2022 PCP: Wilmer Floor, NP   Brief Narrative:  77 y.o. male with medical history significant for insulin-dependent type 2 diabetes, multiple myeloma in remission, on Eliquis for recently diagnosed PE who presents with acute on chronic diarrhea and resultant hypokalemia and hypomagnesemia.  It seems the patient has a longstanding history of chronic diarrhea, has been seen by GI about 5 years ago, maintained on colestipol, as well as as needed Imodium.  He was recently admitted to this facility in early February, for frequent diarrhea and discharged after electrolyte replacement.  Upon admission patient was found to have hypokalemia/hypomagnesemia   Assessment & Plan:  Principal Problem:   Chronic diarrhea Active Problems:   Hypokalemia   Chronic diastolic CHF (congestive heart failure)   COPD (chronic obstructive pulmonary disease)   Type 2 diabetes mellitus with complication, with long-term current use of insulin   Multiple myeloma   Coronary artery disease involving native coronary artery of native heart without angina pectoris   Essential hypertension   Hypomagnesemia   Acute on chronic diarrhea Hypokalemia/hypomagnesemia - Secondary to GI losses from chronic diarrhea.  No evidence of infection noted.  Aggressively replete electrolytes.  Continue colestipol and as needed Imodium C. difficile and GI panel study pending  C scope in 2012 Dr Hung= Polyps were removed.   History of PE - Continue Eliquis  History of multiple myeloma - Follows outpatient Dr. Leonides Schanz  Chronic pain - Pain control, bowel regimen  History of thyroid nodule - Follows outpatient with Dr. Sharl Ma from endocrinology  COPD/ILD - Bronchodilators  Chronic diastolic CHF, EF 38% Essential hypertension - Supportive care.  Overall stable -On Norvasc, losartan.  IV as needed  Diabetes mellitus type 2 - Sliding scale and  Accu-Chek  BPH - Proscar and Flomax  DVT prophylaxis: Eliquis Code Status: Full  Family Communication: Spouse at bedside Awaiting bowel function to return back to normal, aggressively replating electrolytes       Diet Orders (From admission, onward)     Start     Ordered   11/11/22 0832  Diet Carb Modified Fluid consistency: Thin; Room service appropriate? Yes  Diet effective now       Question Answer Comment  Diet-HS Snack? Nothing   Calorie Level Medium 1600-2000   Fluid consistency: Thin   Room service appropriate? Yes      11/11/22 0833            Subjective: Feeling little better this morning, denies any complaints.  Tells me his diarrhea has improved. Tells me he goes through frequent cycles of this and follows with Dr. Elnoria Howard outpatient.   Examination:  General exam: Appears calm and comfortable  Respiratory system: Clear to auscultation. Respiratory effort normal. Cardiovascular system: S1 & S2 heard, RRR. No JVD, murmurs, rubs, gallops or clicks. No pedal edema. Gastrointestinal system: Abdomen is nondistended, soft and nontender. No organomegaly or masses felt. Normal bowel sounds heard. Central nervous system: Alert and oriented. No focal neurological deficits. Extremities: Symmetric 5 x 5 power. Skin: No rashes, lesions or ulcers Psychiatry: Judgement and insight appear normal. Mood & affect appropriate.  Objective: Vitals:   11/11/22 1005 11/11/22 1246 11/11/22 1930 11/12/22 0604  BP: (!) 147/89 (!) 169/80 (!) 147/71 (!) 148/76  Pulse: 87 84 80 86  Resp: 16 18 18 17   Temp: 98.2 F (36.8 C)  98 F (36.7 C) 98.5 F (36.9 C)  TempSrc:  Oral Oral  SpO2: 99% 100% 100% 99%  Weight:  84.7 kg    Height:  6' (1.829 m)      Intake/Output Summary (Last 24 hours) at 11/12/2022 0858 Last data filed at 11/12/2022 0200 Gross per 24 hour  Intake 552.06 ml  Output --  Net 552.06 ml   Filed Weights   11/10/22 1825 11/11/22 1246  Weight: 93.1 kg 84.7 kg     Scheduled Meds:  amLODipine  10 mg Oral Daily   apixaban  5 mg Oral BID   colestipol  1 g Oral BID   Ferrous Fumarate  1 tablet Oral BID   finasteride  5 mg Oral Daily   insulin aspart  0-15 Units Subcutaneous TID WC   insulin aspart  0-5 Units Subcutaneous QHS   losartan  50 mg Oral Daily   morphine  30 mg Oral Q12H   pantoprazole  40 mg Oral BID   tamsulosin  0.4 mg Oral BID   Continuous Infusions:  sodium chloride     sodium chloride     potassium chloride      Nutritional status     Body mass index is 25.33 kg/m.  Data Reviewed:   CBC: Recent Labs  Lab 11/10/22 2331 11/11/22 0644  WBC 4.1 4.2  NEUTROABS 2.4  --   HGB 11.3* 10.9*  HCT 34.5* 34.0*  MCV 90.1 91.2  PLT 205 215   Basic Metabolic Panel: Recent Labs  Lab 11/10/22 2331 11/11/22 0644 11/11/22 1524 11/12/22 0356  NA 136 136 136 135  K 2.6* 2.7* 2.7* 2.8*  CL 99 104 102 105  CO2 GLUCOSE 137* 110* 126* 121*  BUN 8 6* 5* 5*  CREATININE 1.36* 0.96 0.85 0.94  CALCIUM 8.5* 8.0* 8.8* 8.7*  MG  --  1.5*  --   --    GFR: Estimated Creatinine Clearance: 71.1 mL/min (by C-G formula based on SCr of 0.94 mg/dL). Liver Function Tests: Recent Labs  Lab 11/10/22 2331 11/12/22 0356  AST 15 17  ALT 12 10  ALKPHOS 76 87  BILITOT 0.8 1.0  PROT 6.6 6.7  ALBUMIN 3.7 3.8   Recent Labs  Lab 11/10/22 2331  LIPASE 25   No results for input(s): "AMMONIA" in the last 168 hours. Coagulation Profile: No results for input(s): "INR", "PROTIME" in the last 168 hours. Cardiac Enzymes: No results for input(s): "CKTOTAL", "CKMB", "CKMBINDEX", "TROPONINI" in the last 168 hours. BNP (last 3 results) No results for input(s): "PROBNP" in the last 8760 hours. HbA1C: No results for input(s): "HGBA1C" in the last 72 hours. CBG: Recent Labs  Lab 11/11/22 1247 11/11/22 1622 11/11/22 2114  GLUCAP 140* 129* 117*   Lipid Profile: No results for input(s): "CHOL", "HDL", "LDLCALC", "TRIG",  "CHOLHDL", "LDLDIRECT" in the last 72 hours. Thyroid Function Tests: No results for input(s): "TSH", "T4TOTAL", "FREET4", "T3FREE", "THYROIDAB" in the last 72 hours. Anemia Panel: No results for input(s): "VITAMINB12", "FOLATE", "FERRITIN", "TIBC", "IRON", "RETICCTPCT" in the last 72 hours. Sepsis Labs: No results for input(s): "PROCALCITON", "LATICACIDVEN" in the last 168 hours.  No results found for this or any previous visit (from the past 240 hour(s)).       Radiology Studies: CT ABDOMEN PELVIS W CONTRAST  Result Date: 11/11/2022 CLINICAL DATA:  Abdominal pain, acute, nonlocalized. EXAM: CT ABDOMEN AND PELVIS WITH CONTRAST TECHNIQUE: Multidetector CT imaging of the abdomen and pelvis was performed using the standard protocol following bolus administration of intravenous contrast. RADIATION  DOSE REDUCTION: This exam was performed according to the departmental dose-optimization program which includes automated exposure control, adjustment of the mA and/or kV according to patient size and/or use of iterative reconstruction technique. CONTRAST:  OMNIPAQUE IOHEXOL 300 MG/ML  SOLN COMPARISON:  08/19/2022. FINDINGS: Lower chest: Heart is enlarged and there is a trace pericardial effusion. Coronary artery calcifications are noted. Atelectasis or scarring is noted at the lung bases. There is a stable 6 mm nodule in the right middle lobe. Bronchiectasis with subpleural fibrosis and honeycombing is noted at the lung bases. Hepatobiliary: Stable scattered hypodensities are present in the liver, the largest are cystic in attenuation. The gallbladder is surgically absent. No biliary ductal dilatation. Pancreas: Unremarkable. No pancreatic ductal dilatation or surrounding inflammatory changes. Spleen: Normal in size without focal abnormality. Adrenals/Urinary Tract: The adrenal glands are within normal limits. The kidneys enhance symmetrically. Stable hypodensities are present in the kidneys bilaterally,  the largest cystic in attenuation. 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 Additional subcentimeter hypodensities are noted which are too small to further characterize. No renal calculus or hydronephrosis. The bladder is unremarkable. Stomach/Bowel: Stomach is within normal limits. Appendix appears normal. No bowel obstruction, free air or pneumatosis. There is mild thickening of the walls of the ascending colon. Vascular/Lymphatic: Aortic atherosclerosis. No enlarged abdominal or pelvic lymph nodes. Reproductive: Prostate is unremarkable. Other: No abdominopelvic ascites. Musculoskeletal: There is a stable compression deformity at L1. A stable compression deformity is noted in the superior endplate at L4. There is stable mild anterior wedging of the L2 vertebral body. Degenerative changes are present in the thoracic spine. No acute osseous abnormality. IMPRESSION: 1. Mild thickening of the wall of the ascending colon, which may be in part due to underdistention versus colitis. 2. Stable 6 mm nodule in the right middle lobe. 3. Stable fibrotic changes at the lung bases. 4. Coronary artery calcifications and a aortic atherosclerosis. 5. Remaining incidental findings as described above. Electronically Signed   By: Thornell Sartorius M.D.   On: 11/11/2022 01:23   DG Chest 2 View  Result Date: 11/10/2022 CLINICAL DATA:  Chest pain, shortness of breath EXAM: CHEST - 2 VIEW COMPARISON:  08/23/2022 FINDINGS: Lungs are essentially clear. No focal consolidation. No pleural effusion or pneumothorax. Chronic blunting of the left costophrenic angle likely reflects mild subpleural scarring when correlating with prior CT. The heart is normal in size. Degenerative changes of the visualized thoracolumbar spine. IMPRESSION: Normal chest radiographs. Electronically Signed   By: Charline Bills M.D.   On: 11/10/2022 19:14           LOS: 0 days   Time spent= 35  mins    Shadia Larose Joline Maxcy, MD [facility]  If 7PM-7AM, please contact night-coverage  11/12/2022, 8:58 AM

## 2022-11-12 NOTE — Plan of Care (Signed)

## 2022-11-13 DIAGNOSIS — K529 Noninfective gastroenteritis and colitis, unspecified: Secondary | ICD-10-CM | POA: Diagnosis not present

## 2022-11-13 LAB — GLUCOSE, CAPILLARY
Glucose-Capillary: 132 mg/dL — ABNORMAL HIGH (ref 70–99)
Glucose-Capillary: 108 mg/dL — ABNORMAL HIGH (ref 70–99)
Glucose-Capillary: 163 mg/dL — ABNORMAL HIGH (ref 70–99)
Glucose-Capillary: 129 mg/dL — ABNORMAL HIGH (ref 70–99)
Glucose-Capillary: 143 mg/dL — ABNORMAL HIGH (ref 70–99)

## 2022-11-13 LAB — GASTROINTESTINAL PANEL BY PCR, STOOL (REPLACES STOOL CULTURE)

## 2022-11-13 LAB — BASIC METABOLIC PANEL
Anion gap: 9 (ref 5–15)
BUN: 5 mg/dL — ABNORMAL LOW (ref 8–23)
CO2: 23 mmol/L (ref 22–32)
Calcium: 7.9 mg/dL — ABNORMAL LOW (ref 8.9–10.3)
Chloride: 103 mmol/L (ref 98–111)
Creatinine, Ser: 0.92 mg/dL (ref 0.61–1.24)
GFR, Estimated: >60 mL/min (ref >60)
Glucose, Bld: 101 mg/dL — ABNORMAL HIGH (ref 70–99)
Potassium: 2.9 mmol/L — ABNORMAL LOW (ref 3.5–5.1)
Sodium: 135 mmol/L (ref 135–145)

## 2022-11-13 LAB — CBC
HCT: 33.5 % — ABNORMAL LOW (ref 39.0–52.0)
Hemoglobin: 11.1 g/dL — ABNORMAL LOW (ref 13.0–17.0)
MCH: 29.6 pg (ref 26.0–34.0)
MCHC: 33.1 g/dL (ref 30.0–36.0)
MCV: 89.3 fL (ref 80.0–100.0)
Platelets: 200 10*3/uL (ref 150–400)
RBC: 3.75 MIL/uL — ABNORMAL LOW (ref 4.22–5.81)
RDW: 16.6 % — ABNORMAL HIGH (ref 11.5–15.5)
WBC: 4 10*3/uL (ref 4.0–10.5)
nRBC: 0 % (ref 0.0–0.2)

## 2022-11-13 LAB — PHOSPHORUS: Phosphorus: 3.6 mg/dL (ref 2.5–4.6)

## 2022-11-13 LAB — MAGNESIUM: Magnesium: 1.5 mg/dL — ABNORMAL LOW (ref 1.7–2.4)

## 2022-11-13 LAB — POTASSIUM: Potassium: 3.8 mmol/L (ref 3.5–5.1)

## 2022-11-13 MED ORDER — MAGNESIUM SULFATE 2 GM/50ML IV SOLN
2.0000 g | Freq: Once | INTRAVENOUS | Status: DC
  Filled 2022-11-13: qty 50

## 2022-11-13 MED ORDER — MAGNESIUM SULFATE 4 GM/100ML IV SOLN
4.0000 g | Freq: Once | INTRAVENOUS | Status: AC
  Administered 2022-11-13: 4 g via INTRAVENOUS
  Filled 2022-11-13: qty 100

## 2022-11-13 MED ORDER — POTASSIUM CHLORIDE 10 MEQ/100ML IV SOLN: 10.0000 meq | INTRAVENOUS | Status: DC

## 2022-11-13 MED ORDER — MELATONIN 5 MG PO TABS: 5.0000 mg | ORAL_TABLET | Freq: Every evening | ORAL | Status: DC | PRN

## 2022-11-13 MED ORDER — POTASSIUM CHLORIDE 10 MEQ/100ML IV SOLN
10.0000 meq | INTRAVENOUS | Status: DC
  Administered 2022-11-13 (×2): 10 meq via INTRAVENOUS
  Filled 2022-11-13 (×2): qty 100

## 2022-11-13 MED ORDER — POTASSIUM CHLORIDE 20 MEQ PO PACK
40.0000 meq | PACK | Freq: Once | ORAL | Status: AC
  Administered 2022-11-13: 40 meq via ORAL
  Filled 2022-11-13: qty 2

## 2022-11-13 MED ORDER — POTASSIUM CHLORIDE CRYS ER 20 MEQ PO TBCR
40.0000 meq | EXTENDED_RELEASE_TABLET | Freq: Once | ORAL | Status: AC
  Administered 2022-11-13: 40 meq via ORAL
  Filled 2022-11-13: qty 2

## 2022-11-13 NOTE — Progress Notes (Signed)
PROGRESS NOTE    James [name].  MEB:[id number] DOB: 09/03/1945 DOA: 11/10/2022 PCP: Wilmer Floor, NP   Brief Narrative:  77 y.o. male with medical history significant for insulin-dependent type 2 diabetes, multiple myeloma in remission, on Eliquis for recently diagnosed PE who presents with acute on chronic diarrhea and resultant hypokalemia and hypomagnesemia.  It seems the patient has a longstanding history of chronic diarrhea, has been seen by GI about 5 years ago, maintained on colestipol, as well as as needed Imodium.  He was recently admitted to this facility in early February, for frequent diarrhea and discharged after electrolyte replacement.  Upon admission patient was found to have hypokalemia/hypomagnesemia   Assessment & Plan:  Principal Problem:   Chronic diarrhea Active Problems:   Hypokalemia   Chronic diastolic CHF (congestive heart failure)   COPD (chronic obstructive pulmonary disease)   Type 2 diabetes mellitus with complication, with long-term current use of insulin   Multiple myeloma   Coronary artery disease involving native coronary artery of native heart without angina pectoris   Essential hypertension   Hypomagnesemia   Acute on chronic diarrhea Hypokalemia/hypomagnesemia - Secondary to GI losses from chronic diarrhea.  No evidence of infection noted.  Aggressive electrolyte repletion.  Continue colestipol and as needed Imodium C. difficile and GI panel = neg  C scope in 2012 Dr Hung= Polyps were removed.   History of PE - Continue Eliquis  History of multiple myeloma - Follows outpatient Dr. Leonides Schanz  Chronic pain - Pain control, bowel regimen  History of thyroid nodule - Follows outpatient with Dr. Sharl Ma from endocrinology  COPD/ILD - Bronchodilators  Chronic diastolic CHF, EF 80% Essential hypertension - Supportive care.  Overall stable -On Norvasc, losartan.  IV as needed  Diabetes mellitus type 2 - Sliding scale and  Accu-Chek  BPH - Proscar and Flomax  DVT prophylaxis: Eliquis Code Status: Full  Family Communication: Spouse at bedside Awaiting bowel function to return back to normal, aggressively replating electrolytes       Diet Orders (From admission, onward)     Start     Ordered   11/11/22 0832  Diet Carb Modified Fluid consistency: Thin; Room service appropriate? Yes  Diet effective now       Question Answer Comment  Diet-HS Snack? Nothing   Calorie Level Medium 1600-2000   Fluid consistency: Thin   Room service appropriate? Yes      11/11/22 0833            Subjective: 3 episodes of diarrhea in last 24 hours.  Other complaints.   Examination: Constitutional: Not in acute distress Respiratory: Clear to auscultation bilaterally Cardiovascular: Normal sinus rhythm, no rubs Abdomen: Nontender nondistended good bowel sounds Musculoskeletal: No edema noted, right upper extremity in splint Skin: No rashes seen Neurologic: CN 2-12 grossly intact.  And nonfocal Psychiatric: Normal judgment and insight. Alert and oriented x 3. Normal mood.   Objective: Vitals:   11/12/22 0604 11/12/22 1348 11/12/22 1941 11/13/22 0512  BP: (!) 148/76 128/65 (!) 147/83 (!) 131/59  Pulse: 86 85 85 78  Resp: 17 16 18 18   Temp: 98.5 F (36.9 C) 98.1 F (36.7 C) 98.5 F (36.9 C) 98.5 F (36.9 C)  TempSrc: Oral Oral Oral Oral  SpO2: 99% 99% 100% 97%  Weight:      Height:        Intake/Output Summary (Last 24 hours) at 11/13/2022 0859 Last data filed at 11/13/2022 0300 Gross per 24 hour  Intake  1972.19 ml  Output --  Net 1972.19 ml   Filed Weights   11/10/22 1825 11/11/22 1246  Weight: 93.1 kg 84.7 kg    Scheduled Meds:  amLODipine  10 mg Oral Daily   apixaban  5 mg Oral BID   colestipol  1 g Oral BID   Ferrous Fumarate  1 tablet Oral BID   finasteride  5 mg Oral Daily   insulin aspart  0-15 Units Subcutaneous TID WC   insulin aspart  0-5 Units Subcutaneous QHS   losartan  50  mg Oral Daily   morphine  30 mg Oral Q12H   pantoprazole  40 mg Oral BID   potassium chloride  40 mEq Oral Once   tamsulosin  0.4 mg Oral BID   Continuous Infusions:  sodium chloride 75 mL/hr at 11/12/22 2212   magnesium sulfate bolus IVPB     magnesium sulfate bolus IVPB     potassium chloride     potassium chloride      Nutritional status     Body mass index is 25.33 kg/m.  Data Reviewed:   CBC: Recent Labs  Lab 11/10/22 2331 11/11/22 0644 11/13/22 0355  WBC 4.1 4.2 4.0  NEUTROABS 2.4  --   --   HGB 11.3* 10.9* 11.1*  HCT 34.5* 34.0* 33.5*  MCV 90.1 91.2 89.3  PLT 205 215 200   Basic Metabolic Panel: Recent Labs  Lab 11/10/22 2331 11/11/22 0644 11/11/22 1524 11/12/22 0356 11/12/22 2028 11/13/22 0355  NA 136 136 136 135  --  135  K 2.6* 2.7* 2.7* 2.8* 2.8* 2.9*  CL 99 104 102 105  --  103  CO2 28 27 24 22   --  23  GLUCOSE 137* 110* 126* 121*  --  101*  BUN 8 6* 5* 5*  --  5*  CREATININE 1.36* 0.96 0.85 0.94  --  0.92  CALCIUM 8.5* 8.0* 8.8* 8.7*  --  7.9*  MG  --  1.5*  --  1.8  --  1.5*  PHOS  --   --   --  2.9  --  3.6   GFR: Estimated Creatinine Clearance: 72.6 mL/min (by C-G formula based on SCr of 0.92 mg/dL). Liver Function Tests: Recent Labs  Lab 11/10/22 2331 11/12/22 0356  AST 15 17  ALT 12 10  ALKPHOS 76 87  BILITOT 0.8 1.0  PROT 6.6 6.7  ALBUMIN 3.7 3.8   Recent Labs  Lab 11/10/22 2331  LIPASE 25   No results for input(s): "AMMONIA" in the last 168 hours. Coagulation Profile: No results for input(s): "INR", "PROTIME" in the last 168 hours. Cardiac Enzymes: No results for input(s): "CKTOTAL", "CKMB", "CKMBINDEX", "TROPONINI" in the last 168 hours. BNP (last 3 results) No results for input(s): "PROBNP" in the last 8760 hours. HbA1C: No results for input(s): "HGBA1C" in the last 72 hours. CBG: Recent Labs  Lab 11/12/22 0734 11/12/22 1133 11/12/22 1651 11/12/22 2144 11/13/22 0721  GLUCAP 132* 119* 103* 134* 108*    Lipid Profile: No results for input(s): "CHOL", "HDL", "LDLCALC", "TRIG", "CHOLHDL", "LDLDIRECT" in the last 72 hours. Thyroid Function Tests: No results for input(s): "TSH", "T4TOTAL", "FREET4", "T3FREE", "THYROIDAB" in the last 72 hours. Anemia Panel: No results for input(s): "VITAMINB12", "FOLATE", "FERRITIN", "TIBC", "IRON", "RETICCTPCT" in the last 72 hours. Sepsis Labs: No results for input(s): "PROCALCITON", "LATICACIDVEN" in the last 168 hours.  Recent Results (from the past 240 hour(s))  C Difficile Quick Screen w PCR reflex  Status: None   Collection Time: 11/12/22 11:30 AM   Specimen: STOOL  Result Value Ref Range Status   C Diff antigen NEGATIVE NEGATIVE Final   C Diff toxin NEGATIVE NEGATIVE Final   C Diff interpretation No C. difficile detected.  Final    Comment: Performed at [facility], [address] [place], [place] [address]  Gastrointestinal Panel by PCR , Stool     Status: None   Collection Time: 11/12/22 11:30 AM   Specimen: STOOL  Result Value Ref Range Status   Campylobacter species NOT DETECTED NOT DETECTED Final   Plesimonas shigelloides NOT DETECTED NOT DETECTED Final   Salmonella species NOT DETECTED NOT DETECTED Final   Yersinia enterocolitica NOT DETECTED NOT DETECTED Final   Vibrio species NOT DETECTED NOT DETECTED Final   Vibrio cholerae NOT DETECTED NOT DETECTED Final   Enteroaggregative E coli (EAEC) NOT DETECTED NOT DETECTED Final   Enteropathogenic E coli (EPEC) NOT DETECTED NOT DETECTED Final   Enterotoxigenic E coli (ETEC) NOT DETECTED NOT DETECTED Final   Shiga like toxin producing E coli (STEC) NOT DETECTED NOT DETECTED Final   Shigella/Enteroinvasive E coli (EIEC) NOT DETECTED NOT DETECTED Final   Cryptosporidium NOT DETECTED NOT DETECTED Final   Cyclospora cayetanensis NOT DETECTED NOT DETECTED Final   Entamoeba histolytica NOT DETECTED NOT DETECTED Final   Giardia lamblia NOT DETECTED NOT DETECTED Final    Adenovirus F40/41 NOT DETECTED NOT DETECTED Final   Astrovirus NOT DETECTED NOT DETECTED Final   Norovirus GI/GII NOT DETECTED NOT DETECTED Final   Rotavirus A NOT DETECTED NOT DETECTED Final   Sapovirus (I, II, IV, and V) NOT DETECTED NOT DETECTED Final    Comment: Performed at [facility], [address], [place], [place] [address]         Radiology Studies: No results found.         LOS: 1 day   Time spent= 35 mins    Daisean Brodhead Joline Maxcy, MD [facility]  If 7PM-7AM, please contact night-coverage  11/13/2022, 8:59 AM

## 2022-11-14 DIAGNOSIS — K529 Noninfective gastroenteritis and colitis, unspecified: Secondary | ICD-10-CM | POA: Diagnosis not present

## 2022-11-14 LAB — CBC
HCT: 33.8 % — ABNORMAL LOW (ref 39.0–52.0)
Hemoglobin: 11 g/dL — ABNORMAL LOW (ref 13.0–17.0)
MCH: 29.3 pg (ref 26.0–34.0)
MCHC: 32.5 g/dL (ref 30.0–36.0)
MCV: 89.9 fL (ref 80.0–100.0)
Platelets: 200 10*3/uL (ref 150–400)
RBC: 3.76 MIL/uL — ABNORMAL LOW (ref 4.22–5.81)
RDW: 16.7 % — ABNORMAL HIGH (ref 11.5–15.5)
WBC: 4.1 10*3/uL (ref 4.0–10.5)
nRBC: 0 % (ref 0.0–0.2)

## 2022-11-14 LAB — BASIC METABOLIC PANEL
Anion gap: 7 (ref 5–15)
BUN: 6 mg/dL — ABNORMAL LOW (ref 8–23)
CO2: 25 mmol/L (ref 22–32)
Calcium: 8.5 mg/dL — ABNORMAL LOW (ref 8.9–10.3)
Chloride: 106 mmol/L (ref 98–111)
Creatinine, Ser: 1 mg/dL (ref 0.61–1.24)
GFR, Estimated: >60 mL/min (ref >60)
Glucose, Bld: 127 mg/dL — ABNORMAL HIGH (ref 70–99)
Potassium: 3.7 mmol/L (ref 3.5–5.1)
Sodium: 138 mmol/L (ref 135–145)

## 2022-11-14 LAB — GLUCOSE, CAPILLARY: Glucose-Capillary: 207 mg/dL — ABNORMAL HIGH (ref 70–99)

## 2022-11-14 LAB — MAGNESIUM: Magnesium: 2.2 mg/dL (ref 1.7–2.4)

## 2022-11-14 MED ORDER — POTASSIUM CHLORIDE CRYS ER 20 MEQ PO TBCR
40.0000 meq | EXTENDED_RELEASE_TABLET | Freq: Once | ORAL | Status: AC
  Administered 2022-11-14: 40 meq via ORAL
  Filled 2022-11-14: qty 2

## 2022-11-14 NOTE — Plan of Care (Signed)

## 2022-11-14 NOTE — Discharge Summary (Signed)
Physician Discharge Summary  [name] ZOX:[id number] DOB: Jun 24, 1946 DOA: 11/10/2022  PCP: Wilmer Floor, NP  Admit date: 11/10/2022 Discharge date: 11/14/2022  Admitted From: Home Disposition:  Home  Recommendations for Outpatient Follow-up:  Follow up with PCP in 1-2 weeks Please obtain BMP/CBC in one week your next doctors visit.  Follow-up outpatient orthopedic tomorrow, patient has an appointment.   Discharge Condition: Stable CODE STATUS: Full code Diet recommendation: Heart healthy  Brief/Interim Summary: 77 y.o. male with medical history significant for insulin-dependent type 2 diabetes, multiple myeloma in remission, on Eliquis for recently diagnosed PE who presents with acute on chronic diarrhea and resultant hypokalemia and hypomagnesemia.  It seems the patient has a longstanding history of chronic diarrhea, has been seen by GI about 5 years ago, maintained on colestipol, as well as as needed Imodium.  He was recently admitted to this facility in early February, for frequent diarrhea and discharged after electrolyte replacement.  Upon admission patient was found to have hypokalemia/hypomagnesemia.  During the hospitalization patient's electrolytes were aggressively repleted.  Diarrhea remained very stable and he was having 1-2 bowel movements daily which has been chronic.  Signs of infection including C. difficile and GI panel were negative. Today patient is medically stable to be discharged with recommendations as stated above.     Assessment & Plan:  Principal Problem:   Chronic diarrhea Active Problems:   Hypokalemia   Chronic diastolic CHF (congestive heart failure)   COPD (chronic obstructive pulmonary disease)   Type 2 diabetes mellitus with complication, with long-term current use of insulin   Multiple myeloma   Coronary artery disease involving native coronary artery of native heart without angina pectoris   Essential hypertension   Hypomagnesemia    Chronic diarrhea Hypokalemia/hypomagnesemia, resolved - Secondary to GI losses from chronic diarrhea.  No evidence of infection noted.  Aggressive electrolyte repletion.  Continue colestipol and as needed Imodium C. difficile and GI panel = neg   C scope in 2012 Dr Hung= Polyps were removed.   Right upper extremity fracture - Has outpatient follow-up with his orthopedic tomorrow.   History of PE - Continue Eliquis   History of multiple myeloma - Follows outpatient Dr. Leonides Schanz   Chronic pain - Pain control, bowel regimen   History of thyroid nodule - Follows outpatient with Dr. Sharl Ma from endocrinology   COPD/ILD - Bronchodilators   Chronic diastolic CHF, EF 81% Essential hypertension - Supportive care.  Overall stable -On Norvasc, losartan.     Diabetes mellitus type 2 - Resume home regimen   BPH - Proscar and Flomax    Discharge Diagnoses:  Principal Problem:   Chronic diarrhea Active Problems:   Hypokalemia   Chronic diastolic CHF (congestive heart failure)   COPD (chronic obstructive pulmonary disease)   Type 2 diabetes mellitus with complication, with long-term current use of insulin   Multiple myeloma   Coronary artery disease involving native coronary artery of native heart without angina pectoris   Essential hypertension   Hypomagnesemia      Consultations: None.  Subjective: Feeling well no complaints.  Wishes to go home.  Discharge Exam: Vitals:   11/14/22 0611 11/14/22 0845  BP: 135/67 (!) 142/83  Pulse: 72 97  Resp: 18 18  Temp: 98.4 F (36.9 C) 98.5 F (36.9 C)  SpO2: 100% 100%   Vitals:   11/13/22 0512 11/13/22 2100 11/14/22 0611 11/14/22 0845  BP: (!) 131/59 (!) 142/67 135/67 (!) 142/83  Pulse: 78 76 72 97  Resp: 18 18 18 18   Temp: 98.5 F (36.9 C) 98 F (36.7 C) 98.4 F (36.9 C) 98.5 F (36.9 C)  TempSrc: Oral Oral Oral   SpO2: 97% 100% 100% 100%  Weight:      Height:        General: Pt is alert, awake, not in  acute distress Cardiovascular: RRR, S1/S2 +, no rubs, no gallops Respiratory: CTA bilaterally, no wheezing, no rhonchi Abdominal: Soft, NT, ND, bowel sounds + Extremities: no edema, no cyanosis Right upper extremity splint in place.  Discharge Instructions   Allergies as of 11/14/2022       Reactions   Invokana [canagliflozin] Anaphylaxis   Facial/neck/ lip swelling   Ace Inhibitors Cough        Medication List     STOP taking these medications    oxyCODONE-acetaminophen 5-325 MG tablet Commonly known as: PERCOCET/ROXICET   potassium chloride SA 20 MEQ tablet Commonly known as: KLOR-CON M   traMADol 50 MG tablet Commonly known as: ULTRAM       TAKE these medications    albuterol 108 (90 Base) MCG/ACT inhaler Commonly known as: VENTOLIN HFA Inhale into the lungs.   amLODipine 10 MG tablet Commonly known as: NORVASC Take 1 tablet (10 mg total) by mouth every morning.   Eliquis 5 MG Tabs tablet Generic drug: apixaban Take 5 mg by mouth 2 (two) times daily.   Apixaban Starter Pack (10mg  and 5mg ) Commonly known as: ELIQUIS STARTER PACK Take 2 tablets (10 mg total) twice daily for 4 days then continue taking 1 tablet ( 5 mg) twice daily   bismuth subsalicylate 262 MG/15ML suspension Commonly known as: PEPTO BISMOL Take 30 mLs by mouth every 6 (six) hours as needed for indigestion or diarrhea or loose stools.   Calcium Carb-Cholecalciferol 600-10 MG-MCG Tabs Take 1 tablet by mouth 2 (two) times daily.   Calcium-Vitamin D-Minerals 600-400 MG-UNIT Chew Chew 1 tablet by mouth in the morning and at bedtime.   colestipol 1 g tablet Commonly known as: COLESTID Take 2 g by mouth 2 (two) times daily. Noon and bedtime   dexamethasone 4 MG tablet Commonly known as: DECADRON TAKE 5 TABLETS BY MOUTH ONCE A WEEK   Ferrous Fumarate 324 (106 Fe) MG Tabs tablet Commonly known as: HEMOCYTE - 106 mg FE Take 1 tablet by mouth daily.   finasteride 5 MG  tablet Commonly known as: PROSCAR Take 5 mg by mouth daily.   furosemide 20 MG tablet Commonly known as: LASIX TAKE 1 TABLET BY MOUTH EVERY DAY What changed: when to take this   gabapentin 400 MG capsule Commonly known as: NEURONTIN Take 400-800 mg by mouth See admin instructions. Take 400mg  at lunch time and  2 Capsules (800 mg) at bedtime   HYDROcodone-acetaminophen 10-325 MG tablet Commonly known as: NORCO Take 1 tablet by mouth in the morning, at noon, and at bedtime.   lenalidomide 15 MG capsule Commonly known as: REVLIMID Take 1 capsule (15 mg total) by mouth daily. Take 1 capsule for 21 days and then off for 7 days. Discard capsules already on hand.   lidocaine 5 % Commonly known as: Lidoderm Place 1 patch onto the skin daily. Remove & Discard patch within 12 hours or as directed by MD   loperamide 2 MG tablet Commonly known as: Imodium A-D Take 1 tablet (2 mg total) by mouth 3 (three) times daily as needed for diarrhea or loose stools.   losartan 50 MG tablet Commonly known  as: COZAAR Take 1 tablet (50 mg total) by mouth every morning.   morphine 30 MG 12 hr tablet Commonly known as: MS CONTIN Take 30 mg by mouth every 12 (twelve) hours.   naloxone 4 MG/0.1ML Liqd nasal spray kit Commonly known as: NARCAN Place into the nose.   pantoprazole 40 MG tablet Commonly known as: PROTONIX Take 40 mg by mouth 2 (two) times daily.   pioglitazone 15 MG tablet Commonly known as: ACTOS Take 15 mg by mouth in the morning.   potassium chloride 10 MEQ tablet Commonly known as: KLOR-CON Take 10 mEq by mouth daily.   Restasis 0.05 % ophthalmic emulsion Generic drug: cycloSPORINE Place 1 drop into both eyes 2 (two) times daily as needed for dry eyes.   tamsulosin 0.4 MG Caps capsule Commonly known as: FLOMAX Take 0.4 mg by mouth daily.   tiZANidine 4 MG tablet Commonly known as: ZANAFLEX Take 1 tablet (4 mg total) by mouth every 8 (eight) hours as needed for muscle  spasms.   Trelegy Ellipta 200-62.5-25 MCG/ACT Aepb Generic drug: Fluticasone-Umeclidin-Vilant Take 1 puff by mouth 2 (two) times daily.   Evaristo Bury FlexTouch 100 UNIT/ML FlexTouch Pen Generic drug: insulin degludec Inject into the skin daily.   vitamin B-12 500 MCG tablet Commonly known as: CYANOCOBALAMIN Take 500 mcg by mouth daily.   zolpidem 5 MG tablet Commonly known as: AMBIEN Take 1 tablet (5 mg total) by mouth at bedtime as needed for sleep.   ZzzQuil 50 MG/30ML Liqd Generic drug: diphenhydrAMINE HCl (Sleep) Take 30 mLs by mouth at bedtime as needed (sleep).        Follow-up Information     Wilmer Floor, NP. Schedule an appointment as soon as possible for a visit .   Contact information: [address] [place] [address] [phone]         [facility]. Schedule an appointment as soon as possible for a visit .   Specialty: Gastroenterology Contact information: [address] [place] [address] [phone]        Wilmer Floor, NP .   Contact information: [address] [place] [address] [phone]                Allergies  Allergen Reactions   Invokana [Canagliflozin] Anaphylaxis    Facial/neck/ lip swelling   Ace Inhibitors Cough    You were cared for by a hospitalist during your hospital stay. If you have any questions about your discharge medications or the care you received while you were in the hospital after you are discharged, you can call the unit and asked to speak with the hospitalist on call if the hospitalist that took care of you is not available. Once you are discharged, your primary care physician will handle any further medical issues. Please note that no refills for any discharge medications will be authorized once you are discharged, as it is imperative that you return to your primary care physician (or establish a relationship with a primary  care physician if you do not have one) for your aftercare needs so that they can reassess your need for medications and monitor your lab values.  You were cared for by a hospitalist during your hospital stay. If you have any questions about your discharge medications or the care you received while you were in the hospital after you are discharged, you can call the unit and asked to speak with the hospitalist on call if  the hospitalist that took care of you is not available. Once you are discharged, your primary care physician will handle any further medical issues. Please note that NO REFILLS for any discharge medications will be authorized once you are discharged, as it is imperative that you return to your primary care physician (or establish a relationship with a primary care physician if you do not have one) for your aftercare needs so that they can reassess your need for medications and monitor your lab values.  Please request your Prim.MD to go over all Hospital Tests and Procedure/Radiological results at the follow up, please get all Hospital records sent to your Prim MD by signing hospital release before you go home.  Get CBC, CMP, 2 view Chest X ray checked  by Primary MD during your next visit or SNF MD in 5-7 days ( we routinely change or add medications that can affect your baseline labs and fluid status, therefore we recommend that you get the mentioned basic workup next visit with your PCP, your PCP may decide not to get them or add new tests based on their clinical decision)  On your next visit with your primary care physician please Get Medicines reviewed and adjusted.  If you experience worsening of your admission symptoms, develop shortness of breath, life threatening emergency, suicidal or homicidal thoughts you must seek medical attention immediately by calling 911 or calling your MD immediately  if symptoms less severe.  You Must read complete instructions/literature along with all  the possible adverse reactions/side effects for all the Medicines you take and that have been prescribed to you. Take any new Medicines after you have completely understood and accpet all the possible adverse reactions/side effects.   Do not drive, operate heavy machinery, perform activities at heights, swimming or participation in water activities or provide baby sitting services if your were admitted for syncope or siezures until you have seen by Primary MD or a Neurologist and advised to do so again.  Do not drive when taking Pain medications.   Procedures/Studies: CT ABDOMEN PELVIS W CONTRAST  Result Date: 11/11/2022 CLINICAL DATA:  Abdominal pain, acute, nonlocalized. EXAM: CT ABDOMEN AND PELVIS WITH CONTRAST TECHNIQUE: Multidetector CT imaging of the abdomen and pelvis was performed using the standard protocol following bolus administration of intravenous contrast. RADIATION DOSE REDUCTION: This exam was performed according to the departmental dose-optimization program which includes automated exposure control, adjustment of the mA and/or kV according to patient size and/or use of iterative reconstruction technique. CONTRAST:  OMNIPAQUE IOHEXOL 300 MG/ML  SOLN COMPARISON:  08/19/2022. FINDINGS: Lower chest: Heart is enlarged and there is a trace pericardial effusion. Coronary artery calcifications are noted. Atelectasis or scarring is noted at the lung bases. There is a stable 6 mm nodule in the right middle lobe. Bronchiectasis with subpleural fibrosis and honeycombing is noted at the lung bases. Hepatobiliary: Stable scattered hypodensities are present in the liver, the largest are cystic in attenuation. The gallbladder is surgically absent. No biliary ductal dilatation. Pancreas: Unremarkable. No pancreatic ductal dilatation or surrounding inflammatory changes. Spleen: Normal in size without focal abnormality. Adrenals/Urinary Tract: The adrenal glands are within normal limits. The kidneys  enhance symmetrically. Stable hypodensities are present in the kidneys bilaterally, the largest cystic in attenuation. 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 Additional subcentimeter hypodensities are noted which are too small to further characterize. No renal calculus or hydronephrosis. The bladder is unremarkable. Stomach/Bowel: Stomach is  within normal limits. Appendix appears normal. No bowel obstruction, free air or pneumatosis. There is mild thickening of the walls of the ascending colon. Vascular/Lymphatic: Aortic atherosclerosis. No enlarged abdominal or pelvic lymph nodes. Reproductive: Prostate is unremarkable. Other: No abdominopelvic ascites. Musculoskeletal: There is a stable compression deformity at L1. A stable compression deformity is noted in the superior endplate at L4. There is stable mild anterior wedging of the L2 vertebral body. Degenerative changes are present in the thoracic spine. No acute osseous abnormality. IMPRESSION: 1. Mild thickening of the wall of the ascending colon, which may be in part due to underdistention versus colitis. 2. Stable 6 mm nodule in the right middle lobe. 3. Stable fibrotic changes at the lung bases. 4. Coronary artery calcifications and a aortic atherosclerosis. 5. Remaining incidental findings as described above. Electronically Signed   By: Thornell Sartorius M.D.   On: 11/11/2022 01:23   DG Chest 2 View  Result Date: 11/10/2022 CLINICAL DATA:  Chest pain, shortness of breath EXAM: CHEST - 2 VIEW COMPARISON:  08/23/2022 FINDINGS: Lungs are essentially clear. No focal consolidation. No pleural effusion or pneumothorax. Chronic blunting of the left costophrenic angle likely reflects mild subpleural scarring when correlating with prior CT. The heart is normal in size. Degenerative changes of the visualized thoracolumbar spine. IMPRESSION: Normal chest radiographs. Electronically Signed   By: Charline Bills  M.D.   On: 11/10/2022 19:14   DG Elbow 2 Views Right  Result Date: 10/18/2022 CLINICAL DATA:  Fall, right elbow injury EXAM: RIGHT ELBOW - 2 VIEW COMPARISON:  None Available. FINDINGS: There is an acute intra-articular fracture of the olecranon with 2-3 mm proximal displacement and near anatomic alignment of the fracture fragment. The trochlear groove appears congruent. Large right elbow effusion is present. No other fracture identified. Normal overall alignment. Extensive soft tissue swelling superficial to the olecranon. IMPRESSION: 1. Mildly displaced, acute intra-articular fracture of the olecranon. Electronically Signed   By: Helyn Numbers M.D.   On: 10/18/2022 18:53     The results of significant diagnostics from this hospitalization (including imaging, microbiology, ancillary and laboratory) are listed below for reference.     Microbiology: Recent Results (from the past 240 hour(s))  C Difficile Quick Screen w PCR reflex     Status: None   Collection Time: 11/12/22 11:30 AM   Specimen: STOOL  Result Value Ref Range Status   C Diff antigen NEGATIVE NEGATIVE Final   C Diff toxin NEGATIVE NEGATIVE Final   C Diff interpretation No C. difficile detected.  Final    Comment: Performed at [facility], [address] [place], [place] [address]  Gastrointestinal Panel by PCR , Stool     Status: None   Collection Time: 11/12/22 11:30 AM   Specimen: STOOL  Result Value Ref Range Status   Campylobacter species NOT DETECTED NOT DETECTED Final   Plesimonas shigelloides NOT DETECTED NOT DETECTED Final   Salmonella species NOT DETECTED NOT DETECTED Final   Yersinia enterocolitica NOT DETECTED NOT DETECTED Final   Vibrio species NOT DETECTED NOT DETECTED Final   Vibrio cholerae NOT DETECTED NOT DETECTED Final   Enteroaggregative E coli (EAEC) NOT DETECTED NOT DETECTED Final   Enteropathogenic E coli (EPEC) NOT DETECTED NOT DETECTED Final   Enterotoxigenic E coli (ETEC) NOT  DETECTED NOT DETECTED Final   Shiga like toxin producing E coli (STEC) NOT DETECTED NOT DETECTED Final   Shigella/Enteroinvasive E coli (EIEC) NOT DETECTED NOT DETECTED Final   Cryptosporidium NOT  DETECTED NOT DETECTED Final   Cyclospora cayetanensis NOT DETECTED NOT DETECTED Final   Entamoeba histolytica NOT DETECTED NOT DETECTED Final   Giardia lamblia NOT DETECTED NOT DETECTED Final   Adenovirus F40/41 NOT DETECTED NOT DETECTED Final   Astrovirus NOT DETECTED NOT DETECTED Final   Norovirus GI/GII NOT DETECTED NOT DETECTED Final   Rotavirus A NOT DETECTED NOT DETECTED Final   Sapovirus (I, II, IV, and V) NOT DETECTED NOT DETECTED Final    Comment: Performed at [facility], [address] [place], [place] [address]     Labs: BNP (last 3 results) Recent Labs    08/23/22 0022  BNP 103.0*   Basic Metabolic Panel: Recent Labs  Lab 11/11/22 0644 11/11/22 1524 11/12/22 0356 11/12/22 2028 11/13/22 0355 11/13/22 1342 11/14/22 0349  NA 136 136 135  --  135  --  138  K 2.7* 2.7* 2.8* 2.8* 2.9* 3.8 3.7  CL 104 102 105  --  103  --  106  CO2 --  23  --  25  GLUCOSE 110* 126* 121*  --  101*  --  127*  BUN 6* 5* 5*  --  5*  --  6*  CREATININE 0.96 0.85 0.94  --  0.92  --  1.00  CALCIUM 8.0* 8.8* 8.7*  --  7.9*  --  8.5*  MG 1.5*  --  1.8  --  1.5*  --  2.2  PHOS  --   --  2.9  --  3.6  --   --    Liver Function Tests: Recent Labs  Lab 11/10/22 2331 11/12/22 0356  AST 15 17  ALT 12 10  ALKPHOS 76 87  BILITOT 0.8 1.0  PROT 6.6 6.7  ALBUMIN 3.7 3.8   Recent Labs  Lab 11/10/22 2331  LIPASE 25   No results for input(s): "AMMONIA" in the last 168 hours. CBC: Recent Labs  Lab 11/10/22 2331 11/11/22 0644 11/13/22 0355 11/14/22 0349  WBC 4.1 4.2 4.0 4.1  NEUTROABS 2.4  --   --   --   HGB 11.3* 10.9* 11.1* 11.0*  HCT 34.5* 34.0* 33.5* 33.8*  MCV 90.1 91.2 89.3 89.9  PLT 205 215 200 200   Cardiac Enzymes: No results for input(s): "CKTOTAL",  "CKMB", "CKMBINDEX", "TROPONINI" in the last 168 hours. BNP: Invalid input(s): "POCBNP" CBG: Recent Labs  Lab 11/13/22 0721 11/13/22 1144 11/13/22 1637 11/13/22 2053 11/14/22 0852  GLUCAP 108* 163* 129* 143* 207*   D-Dimer No results for input(s): "DDIMER" in the last 72 hours. Hgb A1c No results for input(s): "HGBA1C" in the last 72 hours. Lipid Profile No results for input(s): "CHOL", "HDL", "LDLCALC", "TRIG", "CHOLHDL", "LDLDIRECT" in the last 72 hours. Thyroid function studies No results for input(s): "TSH", "T4TOTAL", "T3FREE", "THYROIDAB" in the last 72 hours.  Invalid input(s): "FREET3" Anemia work up No results for input(s): "VITAMINB12", "FOLATE", "FERRITIN", "TIBC", "IRON", "RETICCTPCT" in the last 72 hours. Urinalysis    Component Value Date/Time   COLORURINE STRAW (A) 08/23/2022 1218   APPEARANCEUR CLEAR 08/23/2022 1218   LABSPEC 1.020 08/23/2022 1218   PHURINE 6.0 08/23/2022 1218   GLUCOSEU >=500 (A) 08/23/2022 1218   HGBUR MODERATE (A) 08/23/2022 1218   BILIRUBINUR NEGATIVE 08/23/2022 1218   KETONESUR 5 (A) 08/23/2022 1218   PROTEINUR 100 (A) 08/23/2022 1218   UROBILINOGEN 1.0 10/25/2011 1058   NITRITE NEGATIVE 08/23/2022 1218   LEUKOCYTESUR NEGATIVE 08/23/2022 1218   Sepsis Labs Recent Labs  Lab 11/10/22 2331 11/11/22 0644 11/13/22 0355 11/14/22 0349  WBC 4.1 4.2 4.0 4.1   Microbiology Recent Results (from the past 240 hour(s))  C Difficile Quick Screen w PCR reflex     Status: None   Collection Time: 11/12/22 11:30 AM   Specimen: STOOL  Result Value Ref Range Status   C Diff antigen NEGATIVE NEGATIVE Final   C Diff toxin NEGATIVE NEGATIVE Final   C Diff interpretation No C. difficile detected.  Final    Comment: Performed at [facility], [address], [place], [place] [address]  Gastrointestinal Panel by PCR , Stool     Status: None   Collection Time: 11/12/22 11:30 AM   Specimen: STOOL  Result Value Ref Range Status    Campylobacter species NOT DETECTED NOT DETECTED Final   Plesimonas shigelloides NOT DETECTED NOT DETECTED Final   Salmonella species NOT DETECTED NOT DETECTED Final   Yersinia enterocolitica NOT DETECTED NOT DETECTED Final   Vibrio species NOT DETECTED NOT DETECTED Final   Vibrio cholerae NOT DETECTED NOT DETECTED Final   Enteroaggregative E coli (EAEC) NOT DETECTED NOT DETECTED Final   Enteropathogenic E coli (EPEC) NOT DETECTED NOT DETECTED Final   Enterotoxigenic E coli (ETEC) NOT DETECTED NOT DETECTED Final   Shiga like toxin producing E coli (STEC) NOT DETECTED NOT DETECTED Final   Shigella/Enteroinvasive E coli (EIEC) NOT DETECTED NOT DETECTED Final   Cryptosporidium NOT DETECTED NOT DETECTED Final   Cyclospora cayetanensis NOT DETECTED NOT DETECTED Final   Entamoeba histolytica NOT DETECTED NOT DETECTED Final   Giardia lamblia NOT DETECTED NOT DETECTED Final   Adenovirus F40/41 NOT DETECTED NOT DETECTED Final   Astrovirus NOT DETECTED NOT DETECTED Final   Norovirus GI/GII NOT DETECTED NOT DETECTED Final   Rotavirus A NOT DETECTED NOT DETECTED Final   Sapovirus (I, II, IV, and V) NOT DETECTED NOT DETECTED Final    Comment: Performed at [facility], [address] [place], [place] [address]     Time coordinating discharge:  I have spent 35 minutes face to face with the patient and on the ward discussing the patients care, assessment, plan and disposition with other care givers. >50% of the time was devoted counseling the patient about the risks and benefits of treatment/Discharge disposition and coordinating care.   SIGNED:   Dimple Nanas, MD  [facility] 11/14/2022, 12:27 PM   If 7PM-7AM, please contact night-coverage

## 2022-11-14 NOTE — Progress Notes (Signed)
Pt is discharging home with no needs. Pt is alert and oriented. Pt AVS was given and explained. Pt Was given time to ask questions. All pt belongings were packed and sent home with the patient. Pt IV was discontinued. Pt wife at the bedside to transport pt home. RN to escort pt to main entrance via wheelchair.

## 2022-11-19 NOTE — Progress Notes (Unsigned)
Synopsis: Referred for COPD by Wilmer Floor, NP  Subjective:   PATIENT ID: [name] GENDER: male DOB: 1946-01-18, MRN: [id number]  No chief complaint on file.  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 Been an issue for over 2 years. DOE with minimal activity - notes it even walking from waiting room. He doesn't have much cough at all, only occasional phlegm production. He doesn't think the papilloma excision helped or worsened his DOE. He doesn't have any orthopnea. He does have BLE swelling. He has been using trelegy for about 3 months and he's not convinced that it is helpful. He's also not convinced that his nebulizers help him much.  He's not sure if he snores. No PND. No morning headaches and not convinced he has excessive daytime sleepiness.   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 Didn't have any trouble breathing back then. Smoked for 30ish years, quit 1985, smoked crack for a little while.   Interval HPI: He says that he has same level of DOE with minimal activity. No cough. Still using trelegy and rinsing mouth afterward. He doesn't believe it's been helpful for him. No CP. He has increased BLE swelling.  Hasn't needed any steroids since last visit for COPD. But he is still on dexamethasone 20 mg weekly for his MM. ----------------------------------- Found to have RUL segmental/SSPE with small clot burden on CTA Chest 1/24, started on eliquis  HST 10/28/21 with mild OSA AHI 12, desat  min 85%.   Otherwise pertinent review of systems is negative.    Past Medical History:  Diagnosis Date   Anemia    Arthritis    BPH (benign prostatic hyperplasia)    CHF (congestive heart failure)    Chronic pain    Diabetes mellitus    Dyspnea    GERD (gastroesophageal reflux disease)    Headache(784.0)    migraines, sinus headaches   Hepatitis    C and B-treated   HLD (hyperlipidemia)    Hypertension    Leukemia    Lupus    Multiple myeloma    Pneumonia 07/2011   Sleep apnea 03/2018   going for fitting on 04-29-18   Thyroid disease    goiter   Urinary frequency      Family History  Problem Relation Age of Onset   Hyperlipidemia Mother    Hypertension Mother      Past Surgical History:  Procedure Laterality Date   ABDOMINAL SURGERY     BACK SURGERY     x 5    BIOPSY  01/02/2020   Procedure: BIOPSY;  Surgeon: Jeani Hawking, MD;  Location: [facility];  Service: Endoscopy;;   CHOLECYSTECTOMY     COLONOSCOPY     COLONOSCOPY WITH PROPOFOL N/A 01/02/2020   Procedure: COLONOSCOPY WITH PROPOFOL;  Surgeon: Jeani Hawking, MD;  Location: [facility];  Service: Endoscopy;  Laterality: N/A;   cyst removal skull  20 years ago   ETHMOIDECTOMY  10/12/2011   Procedure: ETHMOIDECTOMY;  Surgeon: Keturah Barre, MD;  Location[facility] OR;  Service: ENT;  Laterality: Bilateral;  bilateral maxillary sinus osteal enlargement, frontal sinusotomy   EYE SURGERY     bilat cataract with lens implants   HAND SURGERY     right finger   HERNIA REPAIR     MICROLARYNGOSCOPY N/A 02/18/2021   Procedure: MICROLARYNGOSCOPY with Excisional Biopsy;  Surgeon: Osborn Coho, MD;  Location: [facility] OR;  Service: ENT;  Laterality: N/A;   POLYPECTOMY  01/02/2020   Procedure: POLYPECTOMY;  Surgeon: Jeani Hawking, MD;  Location: [facility];  Service: Endoscopy;;   ROTATOR CUFF REPAIR     bilateral   SHOULDER ARTHROSCOPY WITH ROTATOR CUFF REPAIR Left 05/18/2014   Procedure: SHOULDER ARTHROSCOPY WITH  ROTATOR CUFF REPAIR;  Surgeon: Mable Paris, MD;  Location: [facility];  Service: Orthopedics;  Laterality: Left;  Left shoulder arthroscopy rotator cuff repair, subacromial decompression   sinus surgery     TONSILLECTOMY     TRIGGER FINGER RELEASE Right 05/02/2018   Procedure: RELEASE TRIGGER FINGER/A-1 PULLEY RIGHT SMALL FINGER;  Surgeon: Betha Loa, MD;  Location:  SURGERY CENTER;  Service: Orthopedics;  Laterality: Right;    Social History   Socioeconomic History   Marital status: Married    Spouse name: Not on file   Number of children: Not on file   Years of education: Not on file   Highest education level: Not on file  Occupational History   Not on file  Tobacco Use   Smoking status: Former    Packs/day: 1.00    Years: 26.00    Additional pack years: 0.00    Total pack years: 26.00    Types: Cigarettes    Quit date: 05/12/1984    Years since quitting: 38.5   Smokeless tobacco: Never  Vaping Use   Vaping Use: Not on file  Substance and Sexual Activity   Alcohol use: No   Drug use: Not Currently    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 years ago   Sexual activity: Not Currently  Other Topics Concern   Not on file  Social History Narrative   Not on file   Social Determinants of Health   Financial Resource Strain: Not on file  Food Insecurity: No Food Insecurity (11/11/2022)   Hunger Vital Sign    Worried About Running Out of Food in the Last Year: Never true    Ran Out of Food in the Last Year: Never true  Transportation Needs: No Transportation Needs (11/11/2022)   PRAPARE - Administrator, Civil Service (Medical): No    Lack of Transportation (Non-Medical): No  Physical Activity: Not on file  Stress: Not on file  Social Connections: Not on file  Intimate Partner Violence: Not At Risk (11/11/2022)   Humiliation, Afraid, Rape, and Kick questionnaire    Fear of Current or Ex-Partner: No    Emotionally Abused: No     Physically Abused: No    Sexually Abused: No     Allergies  Allergen Reactions   Invokana [Canagliflozin] Anaphylaxis    Facial/neck/ lip swelling   Ace Inhibitors Cough     Outpatient Medications Prior to Visit  Medication Sig Dispense Refill   albuterol (VENTOLIN HFA) 108 (90 Base) MCG/ACT inhaler Inhale into the lungs. (Patient not taking: Reported on 11/11/2022)     amLODipine (NORVASC) 10 MG tablet Take 1 tablet (10 mg total) by mouth every morning.  30 tablet 0   apixaban (ELIQUIS) 5 MG TABS tablet Take 5 mg by mouth 2 (two) times daily.     APIXABAN (ELIQUIS) VTE STARTER PACK (10MG  AND 5MG ) Take 2 tablets (10 mg total) twice daily for 4 days then continue taking 1 tablet ( 5 mg) twice daily (Patient not taking: Reported on 11/11/2022) 74 each 0   bismuth subsalicylate (PEPTO BISMOL) 262 MG/15ML suspension Take 30 mLs by mouth every 6 (six) hours as needed for indigestion or diarrhea or loose stools. (Patient not taking: Reported on 11/11/2022)     Calcium Carb-Cholecalciferol 600-10 MG-MCG TABS Take 1 tablet by mouth 2 (two) times daily.     Calcium Carbonate-Vit D-Min (CALCIUM-VITAMIN D-MINERALS) 600-400 MG-UNIT CHEW Chew 1 tablet by mouth in the morning and at bedtime.     colestipol (COLESTID) 1 g tablet Take 2 g by mouth 2 (two) times daily. Noon and bedtime     dexamethasone (DECADRON) 4 MG tablet TAKE 5 TABLETS BY MOUTH ONCE A WEEK (Patient not taking: Reported on 11/11/2022) 64 tablet 0   diphenhydrAMINE HCl (ZZZQUIL) 50 MG/30ML LIQD Take 30 mLs by mouth at bedtime as needed (sleep). (Patient not taking: Reported on 11/11/2022)     Ferrous Fumarate (HEMOCYTE - 106 MG FE) 324 (106 Fe) MG TABS tablet Take 1 tablet by mouth daily.     finasteride (PROSCAR) 5 MG tablet Take 5 mg by mouth daily.     furosemide (LASIX) 20 MG tablet TAKE 1 TABLET BY MOUTH EVERY DAY (Patient taking differently: Take 20 mg by mouth in the morning.) 90 tablet 3   gabapentin (NEURONTIN) 400 MG capsule Take  400-800 mg by mouth See admin instructions. Take 400mg  at lunch time and  2 Capsules (800 mg) at bedtime     HYDROcodone-acetaminophen (NORCO) 10-325 MG tablet Take 1 tablet by mouth in the morning, at noon, and at bedtime. (Patient not taking: Reported on 11/11/2022)     insulin degludec (TRESIBA FLEXTOUCH) 100 UNIT/ML FlexTouch Pen Inject into the skin daily. (Patient not taking: Reported on 11/11/2022)     lenalidomide (REVLIMID) 15 MG capsule Take 1 capsule (15 mg total) by mouth daily. Take 1 capsule for 21 days and then off for 7 days. Discard capsules already on hand. (Patient not taking: Reported on 11/11/2022) 21 capsule 0   lidocaine (LIDODERM) 5 % Place 1 patch onto the skin daily. Remove & Discard patch within 12 hours or as directed by MD (Patient not taking: Reported on 11/11/2022) 30 patch 0   loperamide (IMODIUM A-D) 2 MG tablet Take 1 tablet (2 mg total) by mouth 3 (three) times daily as needed for diarrhea or loose stools. (Patient not taking: Reported on 11/11/2022) 20 tablet 0   losartan (COZAAR) 50 MG tablet Take 1 tablet (50 mg total) by mouth every morning. 30 tablet 0   morphine (MS CONTIN) 30 MG 12 hr tablet Take 30 mg by mouth every 12 (twelve) hours.     naloxone (NARCAN) nasal spray 4 mg/0.1 mL Place into the nose. (Patient not taking: Reported on 11/11/2022)     pantoprazole (PROTONIX) 40 MG tablet Take 40 mg by mouth 2 (two) times daily.     pioglitazone (ACTOS) 15 MG tablet Take 15 mg by mouth in the morning.     potassium chloride (KLOR-CON) 10 MEQ tablet Take 10 mEq by mouth daily.     RESTASIS 0.05 % ophthalmic emulsion Place 1 drop into both eyes 2 (two) times daily as  needed for dry eyes. (Patient not taking: Reported on 11/11/2022)     Tamsulosin HCl (FLOMAX) 0.4 MG CAPS Take 0.4 mg by mouth daily.     tiZANidine (ZANAFLEX) 4 MG tablet Take 1 tablet (4 mg total) by mouth every 8 (eight) hours as needed for muscle spasms. (Patient not taking: Reported on 11/11/2022) 30  tablet 0   TRELEGY ELLIPTA 200-62.5-25 MCG/ACT AEPB Take 1 puff by mouth 2 (two) times daily. (Patient not taking: Reported on 11/11/2022) 60 each 0   vitamin B-12 (CYANOCOBALAMIN) 500 MCG tablet Take 500 mcg by mouth daily.     zolpidem (AMBIEN) 5 MG tablet Take 1 tablet (5 mg total) by mouth at bedtime as needed for sleep. (Patient not taking: Reported on 11/11/2022) 5 tablet 0   No facility-administered medications prior to visit.       Objective:   Physical Exam:  General appearance: 77 y.o., male, NAD, conversant  Eyes: anicteric sclerae; PERRL, tracking appropriately HENT: NCAT; MMM Neck: Trachea midline; no lymphadenopathy, no JVD Lungs: faint expiratory wheeze L>R, no crackles, with normal respiratory effort CV: RRR, no murmur  Abdomen: Soft, non-tender; non-distended, BS present  Extremities: 2+ pitting BLE peripheral edema, warm Skin: Normal turgor and texture; no rash Psych: Appropriate affect Neuro: Alert and oriented to person and place, no focal deficit     There were no vitals filed for this visit.     on RA BMI Readings from Last 3 Encounters:  11/11/22 25.33 kg/m  09/07/22 27.84 kg/m  08/31/22 26.64 kg/m   Wt Readings from Last 3 Encounters:  11/11/22 186 lb 11.7 oz (84.7 kg)  09/07/22 205 lb 4.8 oz (93.1 kg)  08/31/22 196 lb 6.9 oz (89.1 kg)     CBC    Component Value Date/Time   WBC 4.1 11/14/2022 0349   RBC 3.76 (L) 11/14/2022 0349   HGB 11.0 (L) 11/14/2022 0349   HGB 10.1 (L) 11/03/2022 1348   HGB 13.4 06/26/2017 0946   HCT 33.8 (L) 11/14/2022 0349   HCT 40.8 06/26/2017 0946   PLT 200 11/14/2022 0349   PLT 210 11/03/2022 1348   PLT 236 06/26/2017 0946   MCV 89.9 11/14/2022 0349   MCV 90.7 06/26/2017 0946   MCH 29.3 11/14/2022 0349   MCHC 32.5 11/14/2022 0349   RDW 16.7 (H) 11/14/2022 0349   RDW 15.7 (H) 06/26/2017 0946   LYMPHSABS 0.8 11/10/2022 2331   LYMPHSABS 1.8 06/26/2017 0946   MONOABS 0.6 11/10/2022 2331   MONOABS 0.6  06/26/2017 0946   EOSABS 0.1 11/10/2022 2331   EOSABS 0.1 06/26/2017 0946   BASOSABS 0.1 11/10/2022 2331   BASOSABS 0.1 06/26/2017 0946    Eos 0-200   Chest Imaging:  CXR 2017 reviewed by me with mild r hemidiaphragm elevation, possible small left pleural effusion  CXR 08/29/21 reviewed by me with prominent intersitital markings and peripheral alveolar opacities  HRCT Chest 11/05/21 with mild extent basilar and subpleural predominant reticular opacities with honeycomb change 8mm RML solid nodule  CTA Chest 08/23/22 RUL segmental/SSPE, stable RML nodule,   Pulmonary Functions Testing Results:     No data to display            Echocardiogram:   TTE 08/23/22: 1. Left ventricular ejection fraction, by estimation, is 60 to 65%. The  left ventricle has normal function. The left ventricle has no regional  wall motion abnormalities. Left ventricular diastolic parameters are  indeterminate.   2. Right ventricular systolic function is normal.  The right ventricular  size is normal.   3. The mitral valve is abnormal. No evidence of mitral valve  regurgitation. No evidence of mitral stenosis.   4. The aortic valve is normal in structure. There is mild calcification  of the aortic valve. There is mild thickening of the aortic valve. Aortic  valve regurgitation is not visualized. Aortic valve sclerosis is present,  with no evidence of aortic valve  stenosis.   5. The inferior vena cava is normal in size with greater than 50%  respiratory variability, suggesting right atrial pressure of 3 mmHg.   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   # Likely COPD gold functional group B  # History of OSA not on CPAP  Plan: - HRCT Chest, TTE, US DVT BLE  - f/u home sleep study - trial lasix 20 mg daily, BMP in a week, target weight 200 lb - continue trelegy 1 puff once daily, instructed in ideal use, rinse mouth afterward - albuterol prn     Omar Person, MD [facility] 11/19/2022 6:32 PM

## 2022-11-20 ENCOUNTER — Ambulatory Visit: Payer: Medicare HMO | Admitting: Student

## 2022-11-20 ENCOUNTER — Encounter: Payer: Self-pay | Admitting: Student

## 2022-11-20 VITALS — BP 112/66 | HR 78 | Temp 97.7°F | Ht 72.0 in | Wt 197.0 lb

## 2022-11-20 DIAGNOSIS — G4733 Obstructive sleep apnea (adult) (pediatric): Secondary | ICD-10-CM

## 2022-11-20 DIAGNOSIS — R0609 Other forms of dyspnea: Secondary | ICD-10-CM | POA: Diagnosis not present

## 2022-11-20 NOTE — Patient Instructions (Addendum)
-   trelegy 1 puff once daily rinse mouth after use - PFTs (breathing tests next visit) - we'll set up cpap for you - see you in 3 months

## 2022-11-22 ENCOUNTER — Other Ambulatory Visit: Payer: Self-pay | Admitting: *Deleted

## 2022-11-22 MED ORDER — LENALIDOMIDE 15 MG PO CAPS: 15.0000 mg | ORAL_CAPSULE | Freq: Every day | ORAL | 0 refills | Status: DC

## 2022-11-24 ENCOUNTER — Emergency Department (HOSPITAL_COMMUNITY): Payer: Medicare HMO

## 2022-11-24 ENCOUNTER — Observation Stay (HOSPITAL_COMMUNITY)
Admission: EM | Admit: 2022-11-24 | Discharge: 2022-11-28 | Disposition: A | Discharge status: Home Health | Payer: Medicare HMO | Attending: Internal Medicine | Admitting: Internal Medicine

## 2022-11-24 ENCOUNTER — Other Ambulatory Visit: Payer: Self-pay

## 2022-11-24 ENCOUNTER — Encounter (HOSPITAL_COMMUNITY): Payer: Self-pay

## 2022-11-24 DIAGNOSIS — Z794 Long term (current) use of insulin: Secondary | ICD-10-CM | POA: Diagnosis not present

## 2022-11-24 DIAGNOSIS — I10 Essential (primary) hypertension: Secondary | ICD-10-CM | POA: Diagnosis present

## 2022-11-24 DIAGNOSIS — K3580 Unspecified acute appendicitis: Secondary | ICD-10-CM | POA: Diagnosis not present

## 2022-11-24 DIAGNOSIS — R1084 Generalized abdominal pain: Secondary | ICD-10-CM

## 2022-11-24 DIAGNOSIS — E119 Type 2 diabetes mellitus without complications: Secondary | ICD-10-CM | POA: Insufficient documentation

## 2022-11-24 DIAGNOSIS — K529 Noninfective gastroenteritis and colitis, unspecified: Secondary | ICD-10-CM | POA: Diagnosis not present

## 2022-11-24 DIAGNOSIS — K37 Unspecified appendicitis: Secondary | ICD-10-CM | POA: Diagnosis present

## 2022-11-24 DIAGNOSIS — I110 Hypertensive heart disease with heart failure: Secondary | ICD-10-CM | POA: Insufficient documentation

## 2022-11-24 DIAGNOSIS — Z86711 Personal history of pulmonary embolism: Secondary | ICD-10-CM | POA: Diagnosis not present

## 2022-11-24 DIAGNOSIS — G894 Chronic pain syndrome: Secondary | ICD-10-CM | POA: Diagnosis not present

## 2022-11-24 DIAGNOSIS — K353 Acute appendicitis with localized peritonitis, without perforation or gangrene: Secondary | ICD-10-CM | POA: Diagnosis not present

## 2022-11-24 DIAGNOSIS — E876 Hypokalemia: Secondary | ICD-10-CM | POA: Diagnosis not present

## 2022-11-24 DIAGNOSIS — N179 Acute kidney failure, unspecified: Secondary | ICD-10-CM | POA: Diagnosis not present

## 2022-11-24 DIAGNOSIS — I509 Heart failure, unspecified: Secondary | ICD-10-CM | POA: Insufficient documentation

## 2022-11-24 DIAGNOSIS — Z79899 Other long term (current) drug therapy: Secondary | ICD-10-CM | POA: Diagnosis not present

## 2022-11-24 DIAGNOSIS — J449 Chronic obstructive pulmonary disease, unspecified: Secondary | ICD-10-CM | POA: Diagnosis not present

## 2022-11-24 DIAGNOSIS — Z7901 Long term (current) use of anticoagulants: Secondary | ICD-10-CM | POA: Insufficient documentation

## 2022-11-24 DIAGNOSIS — R062 Wheezing: Secondary | ICD-10-CM | POA: Diagnosis not present

## 2022-11-24 DIAGNOSIS — Z87891 Personal history of nicotine dependence: Secondary | ICD-10-CM | POA: Insufficient documentation

## 2022-11-24 DIAGNOSIS — N400 Enlarged prostate without lower urinary tract symptoms: Secondary | ICD-10-CM | POA: Diagnosis present

## 2022-11-24 DIAGNOSIS — E118 Type 2 diabetes mellitus with unspecified complications: Secondary | ICD-10-CM

## 2022-11-24 LAB — CBC WITH DIFFERENTIAL/PLATELET
Abs Immature Granulocytes: 0 10*3/uL (ref 0.00–0.07)
Basophils Absolute: 0 10*3/uL (ref 0.0–0.1)
Basophils Relative: 1 %
Eosinophils Absolute: 0 10*3/uL (ref 0.0–0.5)
Eosinophils Relative: 1 %
HCT: 37.8 % — ABNORMAL LOW (ref 39.0–52.0)
Hemoglobin: 12.4 g/dL — ABNORMAL LOW (ref 13.0–17.0)
Immature Granulocytes: 0 %
Lymphocytes Relative: 23 %
Lymphs Abs: 0.9 10*3/uL (ref 0.7–4.0)
MCH: 29.4 pg (ref 26.0–34.0)
MCHC: 32.8 g/dL (ref 30.0–36.0)
MCV: 89.6 fL (ref 80.0–100.0)
Monocytes Absolute: 0.4 10*3/uL (ref 0.1–1.0)
Monocytes Relative: 10 %
Neutro Abs: 2.6 10*3/uL (ref 1.7–7.7)
Neutrophils Relative %: 65 %
Platelets: 168 10*3/uL (ref 150–400)
RBC: 4.22 MIL/uL (ref 4.22–5.81)
RDW: 16.5 % — ABNORMAL HIGH (ref 11.5–15.5)
WBC: 4 10*3/uL (ref 4.0–10.5)
nRBC: 0 % (ref 0.0–0.2)

## 2022-11-24 LAB — GLUCOSE, CAPILLARY: Glucose-Capillary: 115 mg/dL — ABNORMAL HIGH (ref 70–99)

## 2022-11-24 LAB — COMPREHENSIVE METABOLIC PANEL
ALT: 13 U/L (ref 0–44)
AST: 22 U/L (ref 15–41)
Albumin: 4.1 g/dL (ref 3.5–5.0)
Alkaline Phosphatase: 84 U/L (ref 38–126)
Anion gap: 11 (ref 5–15)
BUN: 12 mg/dL (ref 8–23)
CO2: 22 mmol/L (ref 22–32)
Calcium: 8.8 mg/dL — ABNORMAL LOW (ref 8.9–10.3)
Chloride: 102 mmol/L (ref 98–111)
Creatinine, Ser: 1.29 mg/dL — ABNORMAL HIGH (ref 0.61–1.24)
GFR, Estimated: 57 mL/min — ABNORMAL LOW (ref >60)
Glucose, Bld: 159 mg/dL — ABNORMAL HIGH (ref 70–99)
Potassium: 4.2 mmol/L (ref 3.5–5.1)
Sodium: 135 mmol/L (ref 135–145)
Total Bilirubin: 1.3 mg/dL — ABNORMAL HIGH (ref 0.3–1.2)
Total Protein: 7.6 g/dL (ref 6.5–8.1)

## 2022-11-24 LAB — MAGNESIUM: Magnesium: 2 mg/dL (ref 1.7–2.4)

## 2022-11-24 LAB — BRAIN NATRIURETIC PEPTIDE: B Natriuretic Peptide: 75.2 pg/mL (ref 0.0–100.0)

## 2022-11-24 LAB — APTT: aPTT: 38 seconds — ABNORMAL HIGH (ref 24–36)

## 2022-11-24 LAB — HEPARIN LEVEL (UNFRACTIONATED): Heparin Unfractionated: >1.1 IU/mL — ABNORMAL HIGH (ref 0.30–0.70)

## 2022-11-24 MED ORDER — PIPERACILLIN-TAZOBACTAM 3.375 G IVPB 30 MIN
3.3750 g | Freq: Once | INTRAVENOUS | Status: AC
  Administered 2022-11-24: 3.375 g via INTRAVENOUS
  Filled 2022-11-24: qty 50

## 2022-11-24 MED ORDER — CYCLOSPORINE 0.05 % OP EMUL: 1.0000 [drp] | Freq: Two times a day (BID) | OPHTHALMIC | Status: DC | PRN

## 2022-11-24 MED ORDER — HYDROCODONE-ACETAMINOPHEN 5-325 MG PO TABS
1.0000 | ORAL_TABLET | Freq: Four times a day (QID) | ORAL | Status: AC | PRN
  Administered 2022-11-24 – 2022-11-25 (×2): 1 via ORAL
  Filled 2022-11-24 (×2): qty 1

## 2022-11-24 MED ORDER — UMECLIDINIUM BROMIDE 62.5 MCG/ACT IN AEPB
1.0000 | INHALATION_SPRAY | Freq: Every day | RESPIRATORY_TRACT | Status: DC
  Administered 2022-11-25 – 2022-11-28 (×4): 1 via RESPIRATORY_TRACT
  Filled 2022-11-24: qty 7

## 2022-11-24 MED ORDER — PIPERACILLIN-TAZOBACTAM 3.375 G IVPB 30 MIN: 3.3750 g | Freq: Three times a day (TID) | INTRAVENOUS | Status: DC

## 2022-11-24 MED ORDER — POTASSIUM CHLORIDE IN NACL 20-0.9 MEQ/L-% IV SOLN
INTRAVENOUS | Status: DC
  Filled 2022-11-24 (×2): qty 1000

## 2022-11-24 MED ORDER — PANTOPRAZOLE SODIUM 40 MG PO TBEC
40.0000 mg | DELAYED_RELEASE_TABLET | Freq: Two times a day (BID) | ORAL | Status: DC
  Administered 2022-11-24 – 2022-11-28 (×7): 40 mg via ORAL
  Filled 2022-11-24 (×7): qty 1

## 2022-11-24 MED ORDER — FINASTERIDE 5 MG PO TABS: 5.0000 mg | ORAL_TABLET | Freq: Every day | ORAL | Status: DC

## 2022-11-24 MED ORDER — PIPERACILLIN-TAZOBACTAM 3.375 G IVPB
3.3750 g | Freq: Three times a day (TID) | INTRAVENOUS | Status: DC
  Administered 2022-11-25 – 2022-11-27 (×8): 3.375 g via INTRAVENOUS
  Filled 2022-11-24 (×7): qty 50

## 2022-11-24 MED ORDER — INSULIN ASPART 100 UNIT/ML IJ SOLN
0.0000 [IU] | Freq: Every day | INTRAMUSCULAR | Status: DC
  Administered 2022-11-26: 3 [IU] via SUBCUTANEOUS
  Filled 2022-11-24: qty 0.05

## 2022-11-24 MED ORDER — ACETAMINOPHEN 325 MG PO TABS
650.0000 mg | ORAL_TABLET | Freq: Four times a day (QID) | ORAL | Status: DC | PRN
  Administered 2022-11-25: 650 mg via ORAL
  Filled 2022-11-24 (×2): qty 2

## 2022-11-24 MED ORDER — ALBUTEROL SULFATE (2.5 MG/3ML) 0.083% IN NEBU
2.5000 mg | INHALATION_SOLUTION | Freq: Once | RESPIRATORY_TRACT | Status: AC
  Administered 2022-11-24: 2.5 mg via RESPIRATORY_TRACT
  Filled 2022-11-24: qty 3

## 2022-11-24 MED ORDER — COLESTIPOL HCL 1 G PO TABS
2.0000 g | ORAL_TABLET | Freq: Two times a day (BID) | ORAL | Status: DC
  Administered 2022-11-24 – 2022-11-28 (×7): 2 g via ORAL
  Filled 2022-11-24 (×9): qty 2

## 2022-11-24 MED ORDER — MORPHINE SULFATE ER 30 MG PO TBCR
30.0000 mg | EXTENDED_RELEASE_TABLET | Freq: Two times a day (BID) | ORAL | Status: DC
  Administered 2022-11-24 – 2022-11-28 (×7): 30 mg via ORAL
  Filled 2022-11-24 (×2): qty 1
  Filled 2022-11-24 (×3): qty 2
  Filled 2022-11-24 (×2): qty 1

## 2022-11-24 MED ORDER — INSULIN ASPART 100 UNIT/ML IJ SOLN
0.0000 [IU] | Freq: Three times a day (TID) | INTRAMUSCULAR | Status: DC
  Administered 2022-11-26: 3 [IU] via SUBCUTANEOUS
  Administered 2022-11-27: 2 [IU] via SUBCUTANEOUS
  Administered 2022-11-27: 3 [IU] via SUBCUTANEOUS
  Administered 2022-11-28: 2 [IU] via SUBCUTANEOUS
  Filled 2022-11-24: qty 0.15

## 2022-11-24 MED ORDER — FLUTICASONE FUROATE-VILANTEROL 200-25 MCG/ACT IN AEPB
1.0000 | INHALATION_SPRAY | Freq: Every day | RESPIRATORY_TRACT | Status: DC
  Administered 2022-11-25 – 2022-11-28 (×4): 1 via RESPIRATORY_TRACT
  Filled 2022-11-24: qty 28

## 2022-11-24 MED ORDER — TAMSULOSIN HCL 0.4 MG PO CAPS: 0.4000 mg | ORAL_CAPSULE | Freq: Every day | ORAL | Status: DC

## 2022-11-24 MED ORDER — TRAZODONE HCL 50 MG PO TABS
25.0000 mg | ORAL_TABLET | Freq: Every evening | ORAL | Status: DC | PRN
  Administered 2022-11-24 – 2022-11-28 (×4): 25 mg via ORAL
  Filled 2022-11-24 (×4): qty 1

## 2022-11-24 MED ORDER — LOPERAMIDE HCL 2 MG PO CAPS: 2.0000 mg | ORAL_CAPSULE | Freq: Three times a day (TID) | ORAL | Status: DC | PRN

## 2022-11-24 MED ORDER — AMLODIPINE BESYLATE 10 MG PO TABS
10.0000 mg | ORAL_TABLET | Freq: Every morning | ORAL | Status: DC
  Administered 2022-11-25 – 2022-11-28 (×3): 10 mg via ORAL
  Filled 2022-11-24 (×3): qty 1

## 2022-11-24 MED ORDER — HEPARIN (PORCINE) 25000 UT/250ML-% IV SOLN
1500.0000 [IU]/h | INTRAVENOUS | Status: DC
  Administered 2022-11-24: 1500 [IU]/h via INTRAVENOUS
  Filled 2022-11-24: qty 250

## 2022-11-24 MED ORDER — ACETAMINOPHEN 650 MG RE SUPP: 650.0000 mg | Freq: Four times a day (QID) | RECTAL | Status: DC | PRN

## 2022-11-24 NOTE — ED Triage Notes (Signed)
C/o epigastric pain with diarrhea, congestion, cough, chills, headache x3 weeks.  Also c/o incontinent urine Pt reports recent hospitalization for diarrhea.

## 2022-11-24 NOTE — H&P (Signed)
History and Physical  [name] ZOX:[id number] DOB: 03-Jul-1946 DOA: 11/24/2022  PCP: Wilmer Floor, NP   Chief Complaint: abd pain   HPI: James [name]. is a 77 y.o. male with medical history significant for insulin-dependent type 2 diabetes, multiple myeloma in remission, on Eliquis for recently diagnosed PE who has had multiple hospital admissions due to chronic diarrhea of unclear etiology resulting in electrolyte abnormalities.  He was recently on the hospitalist service from 4/13 to 4/16 for the same reason.  Today he presents to the ER with continued baseline diarrhea, but complaints of abdominal pain.  He does chronically have some abdominal discomfort but, but today his abdominal pain is worse than usual, and centered on the right side of his lower abdomen.  ED Course: Evaluation in the emergency department reveals unremarkable vital signs and labs save for acute renal failure creatinine 1.3 from normal baseline.  CT scan of the abdomen pelvis done today shows possible appendiceal thickening as noted in detail below.  Patient was given empiric IV Zosyn, seen by general surgery who recommends clear liquid diet and hospitalist admission.  Review of Systems: Please see HPI for pertinent positives and negatives. A complete 10 system review of systems are otherwise negative.  Past Medical History:  Diagnosis Date   Anemia    Arthritis    BPH (benign prostatic hyperplasia)    CHF (congestive heart failure) (HCC)    Chronic pain    Diabetes mellitus    Dyspnea    GERD (gastroesophageal reflux disease)    Headache(784.0)    migraines, sinus headaches   Hepatitis    C and B-treated   HLD (hyperlipidemia)    Hypertension    Leukemia (HCC)    Lupus (HCC)    Multiple myeloma (HCC)    Pneumonia 07/2011   Sleep apnea 03/2018   going for fitting on 04-29-18   Thyroid disease    goiter   Urinary frequency    Past Surgical History:  Procedure Laterality Date    ABDOMINAL SURGERY     BACK SURGERY     x 5    BIOPSY  01/02/2020   Procedure: BIOPSY;  Surgeon: Jeani Hawking, MD;  Location: [facility];  Service: Endoscopy;;   CHOLECYSTECTOMY     COLONOSCOPY     COLONOSCOPY WITH PROPOFOL N/A 01/02/2020   Procedure: COLONOSCOPY WITH PROPOFOL;  Surgeon: Jeani Hawking, MD;  Location: [facility];  Service: Endoscopy;  Laterality: N/A;   cyst removal skull  20 years ago   ETHMOIDECTOMY  10/12/2011   Procedure: ETHMOIDECTOMY;  Surgeon: Keturah Barre, MD;  Location: [facility];  Service: ENT;  Laterality: Bilateral;  bilateral maxillary sinus osteal enlargement, frontal sinusotomy   EYE SURGERY     bilat cataract with lens implants   HAND SURGERY     right finger   HERNIA REPAIR     MICROLARYNGOSCOPY N/A 02/18/2021   Procedure: MICROLARYNGOSCOPY with Excisional Biopsy;  Surgeon: Osborn Coho, MD;  Location: [facility] OR;  Service: ENT;  Laterality: N/A;   POLYPECTOMY  01/02/2020   Procedure: POLYPECTOMY;  Surgeon: Jeani Hawking, MD;  Location: [facility];  Service: Endoscopy;;   ROTATOR CUFF REPAIR     bilateral   SHOULDER ARTHROSCOPY WITH ROTATOR CUFF REPAIR Left 05/18/2014   Procedure: SHOULDER ARTHROSCOPY WITH ROTATOR CUFF REPAIR;  Surgeon: Mable Paris, MD;  Location: [facility];  Service: Orthopedics;  Laterality: Left;  Left shoulder arthroscopy rotator cuff repair, subacromial decompression  sinus surgery     TONSILLECTOMY     TRIGGER FINGER RELEASE Right 05/02/2018   Procedure: RELEASE TRIGGER FINGER/A-1 PULLEY RIGHT SMALL FINGER;  Surgeon: Betha Loa, MD;  Location: [facility];  Service: Orthopedics;  Laterality: Right;    Social History:  reports that he quit smoking about 38 years ago. His smoking use included cigarettes. He has a 26.00 pack-year smoking history. He has never used smokeless tobacco. He reports that he does not currently use drugs after having used the following drugs: Heroin. He reports that  he does not drink alcohol.   Allergies  Allergen Reactions   Invokana [Canagliflozin] Anaphylaxis    Facial/neck/ lip swelling   Ace Inhibitors Cough    Family History  Problem Relation Age of Onset   Hyperlipidemia Mother    Hypertension Mother      Prior to Admission medications   Medication Sig Start Date End Date Taking? Authorizing Provider  albuterol (VENTOLIN HFA) 108 (90 Base) MCG/ACT inhaler Inhale into the lungs. 03/20/22   [provider]  amLODipine (NORVASC) 10 MG tablet Take 1 tablet (10 mg total) by mouth every morning. 09/01/22   Burnadette Pop, MD  apixaban (ELIQUIS) 5 MG TABS tablet Take 5 mg by mouth 2 (two) times daily.    [provider]  APIXABAN Everlene Balls) VTE STARTER PACK (10MG  AND 5MG ) Take 2 tablets (10 mg total) twice daily for 4 days then continue taking 1 tablet ( 5 mg) twice daily 09/01/22   Burnadette Pop, MD  bismuth subsalicylate (PEPTO BISMOL) 262 MG/15ML suspension Take 30 mLs by mouth every 6 (six) hours as needed for indigestion or diarrhea or loose stools.    [provider]  Calcium Carb-Cholecalciferol 600-10 MG-MCG TABS Take 1 tablet by mouth 2 (two) times daily. 10/30/22   [provider]  Calcium Carbonate-Vit D-Min (CALCIUM-VITAMIN D-MINERALS) 600-400 MG-UNIT CHEW Chew 1 tablet by mouth in the morning and at bedtime.    [provider]  colestipol (COLESTID) 1 g tablet Take 2 g by mouth 2 (two) times daily. Noon and bedtime 02/11/20   [provider]  diphenhydrAMINE HCl (ZZZQUIL) 50 MG/30ML LIQD Take 30 mLs by mouth at bedtime as needed (sleep).    [provider]  Ferrous Fumarate (HEMOCYTE - 106 MG FE) 324 (106 Fe) MG TABS tablet Take 1 tablet by mouth daily. 02/15/21   [provider]  finasteride (PROSCAR) 5 MG tablet Take 5 mg by mouth daily.    [provider]  furosemide (LASIX) 20 MG tablet TAKE 1 TABLET BY MOUTH EVERY DAY Patient taking differently: Take 20 mg  by mouth in the morning. 08/08/22   Omar Person, MD  gabapentin (NEURONTIN) 400 MG capsule Take 400-800 mg by mouth See admin instructions. Take 400mg  at lunch time and  2 Capsules (800 mg) at bedtime 08/26/15   [provider]  HYDROcodone-acetaminophen (NORCO) 10-325 MG tablet Take 1 tablet by mouth in the morning, at noon, and at bedtime. 09/16/16   [provider]  insulin degludec (TRESIBA FLEXTOUCH) 100 UNIT/ML FlexTouch Pen Inject into the skin daily.    [provider]  lenalidomide (REVLIMID) 15 MG capsule Take 1 capsule (15 mg total) by mouth daily. Take 1 capsule for 21 days and then off for 7 days. Discard capsules already on hand. 11/22/22   Jaci Standard, MD  lidocaine (LIDODERM) 5 % Place 1 patch onto the skin daily. Remove & Discard patch within 12  hours or as directed by MD 01/02/22   Virgina Norfolk, DO  loperamide (IMODIUM A-D) 2 MG tablet Take 1 tablet (2 mg total) by mouth 3 (three) times daily as needed for diarrhea or loose stools. 09/01/22   Burnadette Pop, MD  losartan (COZAAR) 50 MG tablet Take 1 tablet (50 mg total) by mouth every morning. 09/01/22   Burnadette Pop, MD  morphine (MS CONTIN) 30 MG 12 hr tablet Take 30 mg by mouth every 12 (twelve) hours. 07/20/22   [provider]  naloxone [facility]) nasal spray 4 mg/0.1 mL Place into the nose. 10/25/22   [provider]  pantoprazole (PROTONIX) 40 MG tablet Take 40 mg by mouth 2 (two) times daily. 12/14/20   [provider]  pioglitazone (ACTOS) 15 MG tablet Take 15 mg by mouth in the morning. 01/13/21   [provider]  potassium chloride (KLOR-CON) 10 MEQ tablet Take 10 mEq by mouth daily. 10/30/22   [provider]  RESTASIS 0.05 % ophthalmic emulsion Place 1 drop into both eyes 2 (two) times daily as needed for dry eyes. 11/27/19   [provider]  Tamsulosin HCl (FLOMAX) 0.4 MG CAPS Take 0.4 mg by mouth daily.    [provider]   tiZANidine (ZANAFLEX) 4 MG tablet Take 1 tablet (4 mg total) by mouth every 8 (eight) hours as needed for muscle spasms. 09/01/22   Burnadette Pop, MD  TRELEGY ELLIPTA 200-62.5-25 MCG/ACT AEPB Take 1 puff by mouth 2 (two) times daily. 09/01/22   Burnadette Pop, MD  vitamin B-12 (CYANOCOBALAMIN) 500 MCG tablet Take 500 mcg by mouth daily.    [provider]  zolpidem (AMBIEN) 5 MG tablet Take 1 tablet (5 mg total) by mouth at bedtime as needed for sleep. 09/01/22   Burnadette Pop, MD  terazosin (HYTRIN) 5 MG capsule Take 5 mg by mouth 2 (two) times daily.   10/25/11  [provider]    Physical Exam: BP 131/69   Pulse 80   Temp 99 F (37.2 C) (Oral)   Resp 20   Wt 89 kg   SpO2 97%   BMI 26.61 kg/m   General:  Alert, oriented, calm, in no acute distress, family at the bedside, chronically ill-appearing but in no acute distress. Eyes: EOMI, clear conjuctivae, white sclerea Neck: supple, no masses, trachea mildline  Cardiovascular: RRR, no murmurs or rubs, no peripheral edema  Respiratory: clear to auscultation bilaterally, no wheezes, no crackles  Abdomen: soft, some voluntary guarding, tender in the right upper and lower quadrants with deep palpation.  Normal active bowel tones heard, no rebound tenderness. Skin: dry, no rashes  Musculoskeletal: no joint effusions, normal range of motion  Psychiatric: appropriate affect, normal speech  Neurologic: extraocular muscles intact, clear speech, moving all extremities with intact sensorium          Labs on Admission:  Basic Metabolic Panel: Recent Labs  Lab 11/24/22 1248  NA 135  K 4.2  CL 102  CO2 22  GLUCOSE 159*  BUN 12  CREATININE 1.29*  CALCIUM 8.8*  MG 2.0   Liver Function Tests: Recent Labs  Lab 11/24/22 1248  AST 22  ALT 13  ALKPHOS 84  BILITOT 1.3*  PROT 7.6  ALBUMIN 4.1   No results for input(s): "LIPASE", "AMYLASE" in the last 168 hours. No results for input(s): "AMMONIA" in the last 168  hours. CBC: Recent Labs  Lab 11/24/22 1248  WBC 4.0  NEUTROABS 2.6  HGB 12.4*  HCT 37.8*  MCV 89.6  PLT 168   Cardiac Enzymes: No results for input(s): "CKTOTAL", "CKMB", "CKMBINDEX", "TROPONINI" in the last 168 hours.  BNP (last 3 results) Recent Labs    08/23/22 0022 11/24/22 1248  BNP 103.0* 75.2    ProBNP (last 3 results) No results for input(s): "PROBNP" in the last 8760 hours.  CBG: No results for input(s): "GLUCAP" in the last 168 hours.  Radiological Exams on Admission: CT ABDOMEN PELVIS WO CONTRAST  Result Date: 11/24/2022 CLINICAL DATA:  Diverticulitis. Epigastric pain with diarrhea, congestion, and cough. History of multiple myeloma. EXAM: CT ABDOMEN AND PELVIS WITHOUT CONTRAST TECHNIQUE: Multidetector CT imaging of the abdomen and pelvis was performed following the standard protocol without IV contrast. RADIATION DOSE REDUCTION: This exam was performed according to the departmental dose-optimization program which includes automated exposure control, adjustment of the mA and/or kV according to patient size and/or use of iterative reconstruction technique. COMPARISON:  11/11/2022 FINDINGS: Poor signal to noise ratio and streak artifact in the upper abdomen due to arm positioning. Lower chest: Mild airway plugging in both lower lobes with mild atelectasis in the lower lobes and lingula. Descending thoracic aortic atherosclerosis. Mild cardiomegaly. Small pericardial effusion. Hepatobiliary: Stable hepatic cysts warranting no further imaging workup. Cholecystectomy. Pancreas: Unremarkable Spleen: Unremarkable Adrenals/Urinary Tract: 9 by 6 mm right kidney lower pole hypodense lesion with internal fat density posteriorly, compatible with small angiomyolipoma. No further imaging workup of this lesion is indicated. No hydronephrosis although there is mild distal left hydroureter extending to the UVJ. No urinary tract calculi identified. Adrenal glands unremarkable. Stomach/Bowel:  Mild fusiform thickening of the appendix up to 0.8 cm in diameter on image 73 series 7, previously 0.7 cm. No periappendiceal inflammatory findings. Accentuated density along the appendiceal orifice probably from barium contrast medium given absence on prior imaging. No dilated bowel. Vascular/Lymphatic: Atherosclerosis is present, including aortoiliac atherosclerotic disease. Reproductive: Unremarkable Other: No supplemental non-categorized findings. Musculoskeletal: Bony demineralization noted. Prominent previous compression fracture at L1 and moderate prior compression fractures at L2 and L4 appear unchanged. Somewhat irregular lucency in the central S1 vertebra on image 49 series 2. Rectus diastasis with anterior abdominal wall laxity. IMPRESSION: 1. Mild abnormal fusiform thickening of the appendix up to 0.8 cm in diameter, previously 0.7 cm. No periappendiceal inflammatory findings. Correlate with specific right lower quadrant tenderness in further assessment for early acute appendicitis. 2. Mild airway plugging in both lower lobes with mild atelectasis in the lower lobes and lingula. 3. Mild cardiomegaly with small pericardial effusion. 4. Mild distal left hydroureter extending to the UVJ. No hydronephrosis or urinary tract calculi identified. 5. Bony demineralization with prior compression fractures at L1, L2, and L4. Somewhat irregular lucency in the central S1 vertebra. Some of the visualize lucencies may be a manifestation of the patient's myeloma. 6. Aortic atherosclerosis. Aortic Atherosclerosis (ICD10-I70.0). Electronically Signed   By: Gaylyn Rong M.D.   On: 11/24/2022 14:09   DG Chest 2 View  Result Date: 11/24/2022 CLINICAL DATA:  Wheezing. Epigastric pain with diarrhea, congestion, cough, chills, headache x3 weeks. EXAM: CHEST - 2 VIEW COMPARISON:  Radiographs 11/10/2022 and 08/23/2022.  CT 08/23/2022. FINDINGS: Suboptimal inspiration. The heart size and mediastinal contours are stable.  Evidence of mild chronic lung disease with scattered scarring and mild bibasilar atelectasis. No airspace disease, edema, pleural effusion or pneumothorax identified. The bones are diffusely demineralized without evidence of acute osseous abnormality. IMPRESSION: Chronic lung disease with mild bibasilar atelectasis. No acute cardiopulmonary process identified. Electronically Signed  By: Carey Bullocks M.D.   On: 11/24/2022 13:44    Assessment/Plan Principal Problem:   Appendicitis-suspected due to CT imaging findings, and right lower quadrant abdominal pain.  Patient is afebrile, with a normal white count. -Observation admission -Appreciate surgery management -Will continue empiric IV Zosyn -Clear liquid diet -Hold home Eliquis, replaced with IV heparin drip for now  Active Problems:   COPD (chronic obstructive pulmonary disease) (HCC)-as needed albuterol, no evidence of acute exacerbation   Type 2 diabetes mellitus with complication, with long-term current use of insulin (HCC)-diabetic diet when eating, with sliding scale insulin.   Chronic diarrhea-this is a chronic problem for him, and he is at baseline   BPH (benign prostatic hyperplasia)   Essential hypertension   AKI (acute kidney injury) (HCC)-hold nephrotoxins, hydrate and recheck renal function with morning labs  DVT prophylaxis: IV heparin drip    Code Status: Full Code  Consults called: General surgery  Admission status: Observation  Time spent: 64 minutes  Xan Sparkman Sharlette Dense MD [facility] Pager [phone]  If 7PM-7AM, please contact night-coverage [email or web address] Password [facility]  11/24/2022, 5:13 PM

## 2022-11-24 NOTE — Consult Note (Addendum)
Consult Note  James [name]. 1945-09-04  [id number].    Requesting MD: Dr. Jeraldine Loots Chief Complaint/Reason for Consult: Abdominal pain, early appendicitis?  HPI:  77 y.o. male with medical history significant for CHF, DM, GERD, hypertension, hyperlipidemia, multiple myeloma, leukemia, OSA who presented to [facility] emergency department with complaints of frequent urination, back pain, abdominal pain.  He was recently admitted to the hospitalist service from 4/12 through 4/16 for management of acute on chronic diarrhea resulting in electrolyte abnormalities.  He says his abdomen has been hurting for "a while "but at least since his previous recent admission.  He has had low appetite and general but also some increased abdominal pain with eating.  He denies nausea/emesis.  Workup in the ED CT scan showing thickening of the appendix in comparison to prior CT scan (4/13).  WBC normal.   He is accompanied by his wife  Substance use: Former cigarette use Blood thinners: Eliquis for history of PE, last dose this a.m. Past Surgeries: Cholecystectomy, open  ROS: As reviewed and as above  Family History  Problem Relation Age of Onset   Hyperlipidemia Mother    Hypertension Mother     Past Medical History:  Diagnosis Date   Anemia    Arthritis    BPH (benign prostatic hyperplasia)    CHF (congestive heart failure) (HCC)    Chronic pain    Diabetes mellitus    Dyspnea    GERD (gastroesophageal reflux disease)    Headache(784.0)    migraines, sinus headaches   Hepatitis    C and B-treated   HLD (hyperlipidemia)    Hypertension    Leukemia (HCC)    Lupus (HCC)    Multiple myeloma (HCC)    Pneumonia 07/2011   Sleep apnea 03/2018   going for fitting on 04-29-18   Thyroid disease    goiter   Urinary frequency     Past Surgical History:  Procedure Laterality Date   ABDOMINAL SURGERY     BACK SURGERY     x 5    BIOPSY  01/02/2020   Procedure: BIOPSY;   Surgeon: Jeani Hawking, MD;  Location: [facility];  Service: Endoscopy;;   CHOLECYSTECTOMY     COLONOSCOPY     COLONOSCOPY WITH PROPOFOL N/A 01/02/2020   Procedure: COLONOSCOPY WITH PROPOFOL;  Surgeon: Jeani Hawking, MD;  Location: [facility];  Service: Endoscopy;  Laterality: N/A;   cyst removal skull  20 years ago   ETHMOIDECTOMY  10/12/2011   Procedure: ETHMOIDECTOMY;  Surgeon: Keturah Barre, MD;  Location: [facility] OR;  Service: ENT;  Laterality: Bilateral;  bilateral maxillary sinus osteal enlargement, frontal sinusotomy   EYE SURGERY     bilat cataract with lens implants   HAND SURGERY     right finger   HERNIA REPAIR     MICROLARYNGOSCOPY N/A 02/18/2021   Procedure: MICROLARYNGOSCOPY with Excisional Biopsy;  Surgeon: Osborn Coho, MD;  Location: [facility] OR;  Service: ENT;  Laterality: N/A;   POLYPECTOMY  01/02/2020   Procedure: POLYPECTOMY;  Surgeon: Jeani Hawking, MD;  Location: [facility];  Service: Endoscopy;;   ROTATOR CUFF REPAIR     bilateral   SHOULDER ARTHROSCOPY WITH ROTATOR CUFF REPAIR Left 05/18/2014   Procedure: SHOULDER ARTHROSCOPY WITH ROTATOR CUFF REPAIR;  Surgeon: Mable Paris, MD;  Location: [facility];  Service: Orthopedics;  Laterality: Left;  Left shoulder arthroscopy rotator cuff repair, subacromial decompression   sinus surgery  TONSILLECTOMY     TRIGGER FINGER RELEASE Right 05/02/2018   Procedure: RELEASE TRIGGER FINGER/A-1 PULLEY RIGHT SMALL FINGER;  Surgeon: Betha Loa, MD;  Location: [facility];  Service: Orthopedics;  Laterality: Right;    Social History:  reports that he quit smoking about 38 years ago. His smoking use included cigarettes. He has a 26.00 pack-year smoking history. He has never used smokeless tobacco. He reports that he does not currently use drugs after having used the following drugs: Heroin. He reports that he does not drink alcohol.  Allergies:  Allergies  Allergen Reactions   Invokana  [Canagliflozin] Anaphylaxis    Facial/neck/ lip swelling   Ace Inhibitors Cough    (Not in a hospital admission)   Blood pressure (!) 145/69, pulse 85, temperature 99.5 F (37.5 C), resp. rate 16, weight 89 kg, SpO2 98 %. Physical Exam: General: pleasant, WD, male who is laying in bed in NAD HEENT: head is normocephalic, atraumatic.  Sclera are noninjected.  Pupils equal and round. EOMs intact.  Ears and nose without any masses or lesions.  Mouth is pink and moist Lungs: Respiratory effort nonlabored Abd: soft, ND, diffuse TTP but greatest in right lower quadrant without rebound or guarding.  Soft reducible umbilical hernia MSK: all 4 extremities are symmetrical with no cyanosis, clubbing, or edema. Skin: warm and dry with no masses, lesions, or rashes Neuro: Cranial nerves 2-12 grossly intact, sensation is normal throughout Psych: A&Ox3 with an appropriate affect.    Results for orders placed or performed during the hospital encounter of 11/24/22 (from the past 48 hour(s))  Brain natriuretic peptide     Status: None   Collection Time: 11/24/22 12:48 PM  Result Value Ref Range   B Natriuretic Peptide 75.2 0.0 - 100.0 pg/mL    Comment: Performed at [facility], [address], [place], [place] [address]  Comprehensive metabolic panel     Status: Abnormal   Collection Time: 11/24/22 12:48 PM  Result Value Ref Range   Sodium 135 135 - 145 mmol/L   Potassium 4.2 3.5 - 5.1 mmol/L    Comment: HEMOLYSIS AT THIS LEVEL MAY AFFECT RESULT   Chloride 102 98 - 111 mmol/L   CO2 22 22 - 32 mmol/L   Glucose, Bld 159 (H) 70 - 99 mg/dL    Comment: Glucose reference range applies only to samples taken after fasting for at least 8 hours.   BUN 12 8 - 23 mg/dL   Creatinine, Ser 6.04 (H) 0.61 - 1.24 mg/dL   Calcium 8.8 (L) 8.9 - 10.3 mg/dL   Total Protein 7.6 6.5 - 8.1 g/dL   Albumin 4.1 3.5 - 5.0 g/dL   AST 22 15 - 41 U/L    Comment: HEMOLYSIS AT THIS LEVEL MAY AFFECT RESULT    ALT 13 0 - 44 U/L    Comment: HEMOLYSIS AT THIS LEVEL MAY AFFECT RESULT   Alkaline Phosphatase 84 38 - 126 U/L   Total Bilirubin 1.3 (H) 0.3 - 1.2 mg/dL    Comment: HEMOLYSIS AT THIS LEVEL MAY AFFECT RESULT   GFR, Estimated 57 (L) >60 mL/min    Comment: (NOTE) Calculated using the CKD-EPI Creatinine Equation (2021)    Anion gap 11 5 - 15    Comment: Performed at [facility], [address] [place], [place] [address]  CBC with Differential     Status: Abnormal   Collection Time: 11/24/22 12:48 PM  Result Value Ref Range  WBC 4.0 4.0 - 10.5 K/uL   RBC 4.22 4.22 - 5.81 MIL/uL   Hemoglobin 12.4 (L) 13.0 - 17.0 g/dL   HCT 18.8 (L) 41.6 - 60.6 %   MCV 89.6 80.0 - 100.0 fL   MCH 29.4 26.0 - 34.0 pg   MCHC 32.8 30.0 - 36.0 g/dL   RDW 30.1 (H) 60.1 - 09.3 %   Platelets 168 150 - 400 K/uL   nRBC 0.0 0.0 - 0.2 %   Neutrophils Relative % 65 %   Neutro Abs 2.6 1.7 - 7.7 K/uL   Lymphocytes Relative 23 %   Lymphs Abs 0.9 0.7 - 4.0 K/uL   Monocytes Relative 10 %   Monocytes Absolute 0.4 0.1 - 1.0 K/uL   Eosinophils Relative 1 %   Eosinophils Absolute 0.0 0.0 - 0.5 K/uL   Basophils Relative 1 %   Basophils Absolute 0.0 0.0 - 0.1 K/uL   Immature Granulocytes 0 %   Abs Immature Granulocytes 0.00 0.00 - 0.07 K/uL    Comment: Performed at [facility], [address], [place], [place] [address]  Magnesium     Status: None   Collection Time: 11/24/22 12:48 PM  Result Value Ref Range   Magnesium 2.0 1.7 - 2.4 mg/dL    Comment: Performed at [facility], [address], [place], [place] [address]   CT ABDOMEN PELVIS WO CONTRAST  Result Date: 11/24/2022 CLINICAL DATA:  Diverticulitis. Epigastric pain with diarrhea, congestion, and cough. History of multiple myeloma. EXAM: CT ABDOMEN AND PELVIS WITHOUT CONTRAST TECHNIQUE: Multidetector CT imaging of the abdomen and pelvis was performed following the standard protocol without IV  contrast. RADIATION DOSE REDUCTION: This exam was performed according to the departmental dose-optimization program which includes automated exposure control, adjustment of the mA and/or kV according to patient size and/or use of iterative reconstruction technique. COMPARISON:  11/11/2022 FINDINGS: Poor signal to noise ratio and streak artifact in the upper abdomen due to arm positioning. Lower chest: Mild airway plugging in both lower lobes with mild atelectasis in the lower lobes and lingula. Descending thoracic aortic atherosclerosis. Mild cardiomegaly. Small pericardial effusion. Hepatobiliary: Stable hepatic cysts warranting no further imaging workup. Cholecystectomy. Pancreas: Unremarkable Spleen: Unremarkable Adrenals/Urinary Tract: 9 by 6 mm right kidney lower pole hypodense lesion with internal fat density posteriorly, compatible with small angiomyolipoma. No further imaging workup of this lesion is indicated. No hydronephrosis although there is mild distal left hydroureter extending to the UVJ. No urinary tract calculi identified. Adrenal glands unremarkable. Stomach/Bowel: Mild fusiform thickening of the appendix up to 0.8 cm in diameter on image 73 series 7, previously 0.7 cm. No periappendiceal inflammatory findings. Accentuated density along the appendiceal orifice probably from barium contrast medium given absence on prior imaging. No dilated bowel. Vascular/Lymphatic: Atherosclerosis is present, including aortoiliac atherosclerotic disease. Reproductive: Unremarkable Other: No supplemental non-categorized findings. Musculoskeletal: Bony demineralization noted. Prominent previous compression fracture at L1 and moderate prior compression fractures at L2 and L4 appear unchanged. Somewhat irregular lucency in the central S1 vertebra on image 49 series 2. Rectus diastasis with anterior abdominal wall laxity. IMPRESSION: 1. Mild abnormal fusiform thickening of the appendix up to 0.8 cm in diameter,  previously 0.7 cm. No periappendiceal inflammatory findings. Correlate with specific right lower quadrant tenderness in further assessment for early acute appendicitis. 2. Mild airway plugging in both lower lobes with mild atelectasis in the lower lobes and lingula. 3. Mild cardiomegaly with small pericardial effusion. 4. Mild distal left hydroureter extending  to the UVJ. No hydronephrosis or urinary tract calculi identified. 5. Bony demineralization with prior compression fractures at L1, L2, and L4. Somewhat irregular lucency in the central S1 vertebra. Some of the visualize lucencies may be a manifestation of the patient's myeloma. 6. Aortic atherosclerosis. Aortic Atherosclerosis (ICD10-I70.0). Electronically Signed   By: Gaylyn Rong M.D.   On: 11/24/2022 14:09   DG Chest 2 View  Result Date: 11/24/2022 CLINICAL DATA:  Wheezing. Epigastric pain with diarrhea, congestion, cough, chills, headache x3 weeks. EXAM: CHEST - 2 VIEW COMPARISON:  Radiographs 11/10/2022 and 08/23/2022.  CT 08/23/2022. FINDINGS: Suboptimal inspiration. The heart size and mediastinal contours are stable. Evidence of mild chronic lung disease with scattered scarring and mild bibasilar atelectasis. No airspace disease, edema, pleural effusion or pneumothorax identified. The bones are diffusely demineralized without evidence of acute osseous abnormality. IMPRESSION: Chronic lung disease with mild bibasilar atelectasis. No acute cardiopulmonary process identified. Electronically Signed   By: Carey Bullocks M.D.   On: 11/24/2022 13:44      Assessment/Plan Abdominal pain Possible early acute appendicitis  Patient seen and examined and relevant labs and imaging personally reviewed.  CT scan showing mild abnormal fusiform thickening of the appendix up to 0.8 cm in diameter, previously 0.7 cm. No periappendiceal inflammatory findings.  He does have an appendicolith.  His history is not completely consistent with appendicitis and  given his duration of symptoms would expect more impressive CT findings.  However he is focally more tender in the right lower quadrant in the setting of the findings above.  Given medical comorbidities recommend hospitalist admission for IV antibiotics and serial abdominal exams. Recommend holding Eliquis if possible.  Okay for heparin drip.  FEN: CLD ID: [id number] VTE: Hold Eliquis, okay for heparin drip  CHF DM Recent PE on Eliquis HTN HLD History of multiple myeloma  I reviewed last 24 h vitals and pain scores, last 48 h intake and output, last 24 h labs and trends, and last 24 h imaging results.   Eric Form, Cvp [facility] 11/24/2022, 4:02 PM Please see Amion for pager number during day hours 7:00am-4:30pm

## 2022-11-24 NOTE — Progress Notes (Signed)
ANTICOAGULATION CONSULT NOTE - Initial Consult  Pharmacy Consult for heparin  Indication: bridge therapy while apixaban for PE on hold  Allergies  Allergen Reactions   Invokana [Canagliflozin] Anaphylaxis    Facial/neck/ lip swelling   Ace Inhibitors Cough    Patient Measurements: Weight: 89 kg (196 lb 3.4 oz) Heparin Dosing Weight: 89 kg  Vital Signs: Temp: 99 F (37.2 C) (04/26 1628) Temp Source: Oral (04/26 1628) BP: 131/69 (04/26 1600) Pulse Rate: 80 (04/26 1600)  Labs: Recent Labs    11/24/22 1248  HGB 12.4*  HCT 37.8*  PLT 168  CREATININE 1.29*    Estimated Creatinine Clearance: 51.8 mL/min (A) (by C-G formula based on SCr of 1.29 mg/dL (H)).   Medical History: Past Medical History:  Diagnosis Date   Anemia    Arthritis    BPH (benign prostatic hyperplasia)    CHF (congestive heart failure) (HCC)    Chronic pain    Diabetes mellitus    Dyspnea    GERD (gastroesophageal reflux disease)    Headache(784.0)    migraines, sinus headaches   Hepatitis    C and B-treated   HLD (hyperlipidemia)    Hypertension    Leukemia (HCC)    Lupus (HCC)    Multiple myeloma (HCC)    Pneumonia 07/2011   Sleep apnea 03/2018   going for fitting on 04-29-18   Thyroid disease    goiter   Urinary frequency      Assessment: 77 yo M on apixaban PTA for PE (08/23/2022) .  Pharmacy consulted to dose heparin for bridge therapy while apixaban on hold for possible early acute appendicitis.  PTA on apixaban 5 mg po BID, last dose this morning. Hg 12.4, PLT 168, SCr 1.29  Goal of Therapy:  Heparin level 0.3-0.7 units/ml aPTT 66-102 seconds Monitor platelets by anticoagulation protocol: Yes   Plan:  Draw baseline aPTT & heparin level now - apixaban  @ 2200 start heparin drip with no bolus at 1500 units/hr (was therapeutic @ this rate 08/23/2022)  Check 8 hour aPTT Daily CBC, aPTT, heparin level  Herby Abraham, Pharm.D Use secure chat for questions 11/24/2022 5:53  PM

## 2022-11-24 NOTE — ED Provider Notes (Signed)
James [name]   CSN: [id number] Arrival date & time: 11/24/22  1131     History  Chief Complaint  Patient presents with   Abdominal Pain   Nasal Congestion   Diarrhea    James [name]. is a 77 y.o. male.  HPI Patient presents with his wife who assists the history.  He presents with concern for ongoing abdominal discomfort, loose stool with new sinus congestion. Patient's history is notable for chronic diarrhea going back years.  He was seen, evaluated, admitted earlier this month.  Notes that since discharge she has continued to have loose stool, but possibly has different abdominal pain than usual. He reports some subjective fever, and a temperature of 99.5 degrees today. No vomiting.  There is nausea.     Home Medications Prior to Admission medications   Medication Sig Start Date End Date Taking? Authorizing Provider  albuterol (VENTOLIN HFA) 108 (90 Base) MCG/ACT inhaler Inhale into the lungs. 03/20/22   [provider]  amLODipine (NORVASC) 10 MG tablet Take 1 tablet (10 mg total) by mouth every morning. 09/01/22   Burnadette Pop, MD  apixaban (ELIQUIS) 5 MG TABS tablet Take 5 mg by mouth 2 (two) times daily.    [provider]  APIXABAN Everlene Balls) VTE STARTER PACK (10MG  AND 5MG ) Take 2 tablets (10 mg total) twice daily for 4 days then continue taking 1 tablet ( 5 mg) twice daily 09/01/22   Burnadette Pop, MD  bismuth subsalicylate (PEPTO BISMOL) 262 MG/15ML suspension Take 30 mLs by mouth every 6 (six) hours as needed for indigestion or diarrhea or loose stools.    [provider]  Calcium Carb-Cholecalciferol 600-10 MG-MCG TABS Take 1 tablet by mouth 2 (two) times daily. 10/30/22   [provider]  Calcium Carbonate-Vit D-Min (CALCIUM-VITAMIN D-MINERALS) 600-400 MG-UNIT CHEW Chew 1 tablet by mouth in the morning and at bedtime.    [provider]  colestipol (COLESTID) 1  g tablet Take 2 g by mouth 2 (two) times daily. Noon and bedtime 02/11/20   [provider]  diphenhydrAMINE HCl (ZZZQUIL) 50 MG/30ML LIQD Take 30 mLs by mouth at bedtime as needed (sleep).    [provider]  Ferrous Fumarate (HEMOCYTE - 106 MG FE) 324 (106 Fe) MG TABS tablet Take 1 tablet by mouth daily. 02/15/21   [provider]  finasteride (PROSCAR) 5 MG tablet Take 5 mg by mouth daily.    [provider]  furosemide (LASIX) 20 MG tablet TAKE 1 TABLET BY MOUTH EVERY DAY Patient taking differently: Take 20 mg by mouth in the morning. 08/08/22   Omar Person, MD  gabapentin (NEURONTIN) 400 MG capsule Take 400-800 mg by mouth See admin instructions. Take 400mg  at lunch time and  2 Capsules (800 mg) at bedtime 08/26/15   [provider]  HYDROcodone-acetaminophen (NORCO) 10-325 MG tablet Take 1 tablet by mouth in the morning, at noon, and at bedtime. 09/16/16   [provider]  insulin degludec (TRESIBA FLEXTOUCH) 100 UNIT/ML FlexTouch Pen Inject into the skin daily.    [provider]  lenalidomide (REVLIMID) 15 MG capsule Take 1 capsule (15 mg total) by mouth daily. Take 1 capsule for 21 days and then off for 7 days. Discard capsules already on hand. 11/22/22   Jaci Standard, MD  lidocaine (LIDODERM) 5 % Place 1 patch onto the skin daily. Remove & Discard patch within 12 hours or as  directed by MD 01/02/22   Virgina Norfolk, DO  loperamide (IMODIUM A-D) 2 MG tablet Take 1 tablet (2 mg total) by mouth 3 (three) times daily as needed for diarrhea or loose stools. 09/01/22   Burnadette Pop, MD  losartan (COZAAR) 50 MG tablet Take 1 tablet (50 mg total) by mouth every morning. 09/01/22   Burnadette Pop, MD  morphine (MS CONTIN) 30 MG 12 hr tablet Take 30 mg by mouth every 12 (twelve) hours. 07/20/22   [provider]  naloxone [facility]) nasal spray 4 mg/0.1 mL Place into the nose. 10/25/22   [provider]  pantoprazole  (PROTONIX) 40 MG tablet Take 40 mg by mouth 2 (two) times daily. 12/14/20   [provider]  pioglitazone (ACTOS) 15 MG tablet Take 15 mg by mouth in the morning. 01/13/21   [provider]  potassium chloride (KLOR-CON) 10 MEQ tablet Take 10 mEq by mouth daily. 10/30/22   [provider]  RESTASIS 0.05 % ophthalmic emulsion Place 1 drop into both eyes 2 (two) times daily as needed for dry eyes. 11/27/19   [provider]  Tamsulosin HCl (FLOMAX) 0.4 MG CAPS Take 0.4 mg by mouth daily.    [provider]  tiZANidine (ZANAFLEX) 4 MG tablet Take 1 tablet (4 mg total) by mouth every 8 (eight) hours as needed for muscle spasms. 09/01/22   Burnadette Pop, MD  TRELEGY ELLIPTA 200-62.5-25 MCG/ACT AEPB Take 1 puff by mouth 2 (two) times daily. 09/01/22   Burnadette Pop, MD  vitamin B-12 (CYANOCOBALAMIN) 500 MCG tablet Take 500 mcg by mouth daily.    [provider]  zolpidem (AMBIEN) 5 MG tablet Take 1 tablet (5 mg total) by mouth at bedtime as needed for sleep. 09/01/22   Burnadette Pop, MD  terazosin (HYTRIN) 5 MG capsule Take 5 mg by mouth 2 (two) times daily.   10/25/11  [provider]      Allergies    Invokana [canagliflozin] and Ace inhibitors    Review of Systems   Review of Systems  All other systems reviewed and are negative.   Physical Exam Updated Vital Signs BP 131/69   Pulse 80   Temp 99.5 F (37.5 C)   Resp 20   Wt 89 kg   SpO2 97%   BMI 26.61 kg/m  Physical Exam Vitals and nursing [name].  Constitutional:      General: He is not in acute distress.    Appearance: He is well-developed.  HENT:     Head: Normocephalic and atraumatic.  Eyes:     Conjunctiva/sclera: Conjunctivae normal.  Cardiovascular:     Rate and Rhythm: Normal rate and regular rhythm.  Pulmonary:     Effort: Pulmonary effort is normal. No respiratory distress.     Breath sounds: No stridor. Examination of the left-middle field reveals  wheezing. Wheezing present.  Abdominal:     General: There is no distension.     Tenderness: There is generalized abdominal tenderness.  Skin:    General: Skin is warm and dry.  Neurological:     Mental Status: He is alert and oriented to person, place, and time.     ED Results / Procedures / Treatments   Labs (all labs ordered are listed, but only abnormal results are displayed) Labs Reviewed  COMPREHENSIVE METABOLIC PANEL - Abnormal; Notable for the following components:      Result Value   Glucose, Bld 159 (*)    Creatinine, Ser 1.29 (*)  Calcium 8.8 (*)    Total Bilirubin 1.3 (*)    GFR, Estimated 57 (*)    All other components within normal limits  CBC WITH DIFFERENTIAL/PLATELET - Abnormal; Notable for the following components:   Hemoglobin 12.4 (*)    HCT 37.8 (*)    RDW 16.5 (*)    All other components within normal limits  BRAIN NATRIURETIC PEPTIDE  MAGNESIUM    EKG None  Radiology CT ABDOMEN PELVIS WO CONTRAST  Result Date: 11/24/2022 CLINICAL DATA:  Diverticulitis. Epigastric pain with diarrhea, congestion, and cough. History of multiple myeloma. EXAM: CT ABDOMEN AND PELVIS WITHOUT CONTRAST TECHNIQUE: Multidetector CT imaging of the abdomen and pelvis was performed following the standard protocol without IV contrast. RADIATION DOSE REDUCTION: This exam was performed according to the departmental dose-optimization program which includes automated exposure control, adjustment of the mA and/or kV according to patient size and/or use of iterative reconstruction technique. COMPARISON:  11/11/2022 FINDINGS: Poor signal to noise ratio and streak artifact in the upper abdomen due to arm positioning. Lower chest: Mild airway plugging in both lower lobes with mild atelectasis in the lower lobes and lingula. Descending thoracic aortic atherosclerosis. Mild cardiomegaly. Small pericardial effusion. Hepatobiliary: Stable hepatic cysts warranting no further imaging workup.  Cholecystectomy. Pancreas: Unremarkable Spleen: Unremarkable Adrenals/Urinary Tract: 9 by 6 mm right kidney lower pole hypodense lesion with internal fat density posteriorly, compatible with small angiomyolipoma. No further imaging workup of this lesion is indicated. No hydronephrosis although there is mild distal left hydroureter extending to the UVJ. No urinary tract calculi identified. Adrenal glands unremarkable. Stomach/Bowel: Mild fusiform thickening of the appendix up to 0.8 cm in diameter on image 73 series 7, previously 0.7 cm. No periappendiceal inflammatory findings. Accentuated density along the appendiceal orifice probably from barium contrast medium given absence on prior imaging. No dilated bowel. Vascular/Lymphatic: Atherosclerosis is present, including aortoiliac atherosclerotic disease. Reproductive: Unremarkable Other: No supplemental non-categorized findings. Musculoskeletal: Bony demineralization noted. Prominent previous compression fracture at L1 and moderate prior compression fractures at L2 and L4 appear unchanged. Somewhat irregular lucency in the central S1 vertebra on image 49 series 2. Rectus diastasis with anterior abdominal wall laxity. IMPRESSION: 1. Mild abnormal fusiform thickening of the appendix up to 0.8 cm in diameter, previously 0.7 cm. No periappendiceal inflammatory findings. Correlate with specific right lower quadrant tenderness in further assessment for early acute appendicitis. 2. Mild airway plugging in both lower lobes with mild atelectasis in the lower lobes and lingula. 3. Mild cardiomegaly with small pericardial effusion. 4. Mild distal left hydroureter extending to the UVJ. No hydronephrosis or urinary tract calculi identified. 5. Bony demineralization with prior compression fractures at L1, L2, and L4. Somewhat irregular lucency in the central S1 vertebra. Some of the visualize lucencies may be a manifestation of the patient's myeloma. 6. Aortic atherosclerosis.  Aortic Atherosclerosis (ICD10-I70.0). Electronically Signed   By: Gaylyn Rong M.D.   On: 11/24/2022 14:09   DG Chest 2 View  Result Date: 11/24/2022 CLINICAL DATA:  Wheezing. Epigastric pain with diarrhea, congestion, cough, chills, headache x3 weeks. EXAM: CHEST - 2 VIEW COMPARISON:  Radiographs 11/10/2022 and 08/23/2022.  CT 08/23/2022. FINDINGS: Suboptimal inspiration. The heart size and mediastinal contours are stable. Evidence of mild chronic lung disease with scattered scarring and mild bibasilar atelectasis. No airspace disease, edema, pleural effusion or pneumothorax identified. The bones are diffusely demineralized without evidence of acute osseous abnormality. IMPRESSION: Chronic lung disease with mild bibasilar atelectasis. No acute cardiopulmonary process identified. Electronically Signed  By: Carey Bullocks M.D.   On: 11/24/2022 13:44    Procedures Procedures    Medications Ordered in ED Medications  piperacillin-tazobactam (ZOSYN) IVPB 3.375 g (has no administration in time range)  albuterol (PROVENTIL) (2.5 MG/3ML) 0.083% nebulizer solution 2.5 mg (2.5 mg Nebulization Given 11/24/22 1402)    ED Course/ Medical Decision Making/ A&P                             Medical Decision Making Elderly male with multiple medical issues including multiple myeloma, chronic lung disease, and chronic diarrhea presents with abdominal pain, loose stool, borderline fever. Broad differential including progression of disease versus colitis versus acute on chronic diarrhea versus other intra-abdominal processes or pneumonia considered. Cardiac 80 sinus normal Pulse ox 100% room air normal CT, labs ordered. Chart reviewed.   Amount and/or Complexity of Data Reviewed Independent Historian: spouse External Data Reviewed: notes.    Details: From recent hospitalization MRI reviewed Labs: ordered. Decision-making details documented in ED Course. Radiology: ordered and independent  interpretation performed. Decision-making details documented in ED Course.  Risk Prescription drug management. Decision regarding hospitalization.   4:20 PM I have reviewed the patient's CT scan, labs, and on repeat exam he remains in similar condition. With CT concerning for possible early appendicitis I discussed this case with our surgical colleagues, and now we have discussed findings, both about her physical exams. Given the patient's chronic processes there is some suspicion for acute on chronic discomfort, but with consideration of possible early appendicitis patient will start antibiotics, will be admitted to our medicine colleagues for further monitoring, management with surgery following as a consulting team. No early evidence for bacteremia, sepsis, pneumonia. Patient has received fluid resuscitation, [place] dilators, has improved somewhat.        Final Clinical Impression(s) / ED Diagnoses Final diagnoses:  Generalized abdominal pain  Wheezing     Gerhard Munch, MD 11/24/22 1622

## 2022-11-25 DIAGNOSIS — K353 Acute appendicitis with localized peritonitis, without perforation or gangrene: Secondary | ICD-10-CM | POA: Diagnosis not present

## 2022-11-25 LAB — GLUCOSE, CAPILLARY
Glucose-Capillary: 81 mg/dL (ref 70–99)
Glucose-Capillary: 103 mg/dL — ABNORMAL HIGH (ref 70–99)
Glucose-Capillary: 110 mg/dL — ABNORMAL HIGH (ref 70–99)
Glucose-Capillary: 125 mg/dL — ABNORMAL HIGH (ref 70–99)

## 2022-11-25 LAB — BASIC METABOLIC PANEL
Anion gap: 11 (ref 5–15)
BUN: 9 mg/dL (ref 8–23)
CO2: 22 mmol/L (ref 22–32)
Calcium: 8.3 mg/dL — ABNORMAL LOW (ref 8.9–10.3)
Chloride: 105 mmol/L (ref 98–111)
Creatinine, Ser: 1.14 mg/dL (ref 0.61–1.24)
GFR, Estimated: >60 mL/min (ref >60)
Glucose, Bld: 79 mg/dL (ref 70–99)
Potassium: 3.1 mmol/L — ABNORMAL LOW (ref 3.5–5.1)
Sodium: 138 mmol/L (ref 135–145)

## 2022-11-25 LAB — CBC
HCT: 34.7 % — ABNORMAL LOW (ref 39.0–52.0)
Hemoglobin: 11.1 g/dL — ABNORMAL LOW (ref 13.0–17.0)
MCH: 29.1 pg (ref 26.0–34.0)
MCHC: 32 g/dL (ref 30.0–36.0)
MCV: 90.8 fL (ref 80.0–100.0)
Platelets: 144 10*3/uL — ABNORMAL LOW (ref 150–400)
RBC: 3.82 MIL/uL — ABNORMAL LOW (ref 4.22–5.81)
RDW: 16.5 % — ABNORMAL HIGH (ref 11.5–15.5)
WBC: 3.7 10*3/uL — ABNORMAL LOW (ref 4.0–10.5)
nRBC: 0 % (ref 0.0–0.2)

## 2022-11-25 LAB — LIPASE, BLOOD: Lipase: 29 U/L (ref 11–51)

## 2022-11-25 LAB — HEPARIN LEVEL (UNFRACTIONATED): Heparin Unfractionated: >1.1 IU/mL — ABNORMAL HIGH (ref 0.30–0.70)

## 2022-11-25 LAB — APTT
aPTT: <20 seconds — ABNORMAL LOW (ref 24–36)
aPTT: 168 seconds (ref 24–36)

## 2022-11-25 LAB — MAGNESIUM: Magnesium: 2 mg/dL (ref 1.7–2.4)

## 2022-11-25 MED ORDER — HYDROCODONE-ACETAMINOPHEN 10-325 MG PO TABS
1.0000 | ORAL_TABLET | Freq: Once | ORAL | Status: AC | PRN
  Administered 2022-11-25: 1 via ORAL
  Filled 2022-11-25: qty 1

## 2022-11-25 MED ORDER — CHLORHEXIDINE GLUCONATE CLOTH 2 % EX PADS: 6.0000 | MEDICATED_PAD | Freq: Once | CUTANEOUS | Status: DC

## 2022-11-25 MED ORDER — POTASSIUM CHLORIDE CRYS ER 20 MEQ PO TBCR
40.0000 meq | EXTENDED_RELEASE_TABLET | Freq: Two times a day (BID) | ORAL | Status: AC
  Administered 2022-11-25 (×2): 40 meq via ORAL
  Filled 2022-11-25 (×2): qty 2

## 2022-11-25 MED ORDER — IPRATROPIUM-ALBUTEROL 0.5-2.5 (3) MG/3ML IN SOLN
3.0000 mL | Freq: Three times a day (TID) | RESPIRATORY_TRACT | Status: DC
  Administered 2022-11-26 (×2): 3 mL via RESPIRATORY_TRACT
  Filled 2022-11-25 (×2): qty 3

## 2022-11-25 MED ORDER — TAMSULOSIN HCL 0.4 MG PO CAPS
0.4000 mg | ORAL_CAPSULE | Freq: Every day | ORAL | Status: DC
  Administered 2022-11-25 – 2022-11-28 (×3): 0.4 mg via ORAL
  Filled 2022-11-25 (×3): qty 1

## 2022-11-25 MED ORDER — HEPARIN (PORCINE) 25000 UT/250ML-% IV SOLN: 1500.0000 [IU]/h | INTRAVENOUS | Status: DC

## 2022-11-25 MED ORDER — IPRATROPIUM-ALBUTEROL 0.5-2.5 (3) MG/3ML IN SOLN
3.0000 mL | RESPIRATORY_TRACT | Status: DC | PRN
  Administered 2022-11-25 – 2022-11-28 (×2): 3 mL via RESPIRATORY_TRACT
  Filled 2022-11-25: qty 3

## 2022-11-25 MED ORDER — IPRATROPIUM-ALBUTEROL 0.5-2.5 (3) MG/3ML IN SOLN
RESPIRATORY_TRACT | Status: AC
  Filled 2022-11-25: qty 3

## 2022-11-25 MED ORDER — IPRATROPIUM-ALBUTEROL 0.5-2.5 (3) MG/3ML IN SOLN
3.0000 mL | Freq: Four times a day (QID) | RESPIRATORY_TRACT | Status: DC
  Administered 2022-11-25 (×2): 3 mL via RESPIRATORY_TRACT
  Filled 2022-11-25 (×2): qty 3

## 2022-11-25 MED ORDER — CHLORHEXIDINE GLUCONATE CLOTH 2 % EX PADS
6.0000 | MEDICATED_PAD | Freq: Once | CUTANEOUS | Status: AC
  Administered 2022-11-25: 6 via TOPICAL

## 2022-11-25 MED ORDER — HEPARIN (PORCINE) 25000 UT/250ML-% IV SOLN
1200.0000 [IU]/h | INTRAVENOUS | Status: AC
  Administered 2022-11-25: 1200 [IU]/h via INTRAVENOUS
  Filled 2022-11-25: qty 250

## 2022-11-25 MED ORDER — FINASTERIDE 5 MG PO TABS
5.0000 mg | ORAL_TABLET | Freq: Every day | ORAL | Status: DC
  Administered 2022-11-25 – 2022-11-28 (×3): 5 mg via ORAL
  Filled 2022-11-25 (×3): qty 1

## 2022-11-25 NOTE — Progress Notes (Addendum)
PROGRESS NOTE  James Rim. NWG:[id number] DOB: [birth year] DOA: 11/24/2022 PCP: Wilmer Floor, NP   HPI/Recap of past 24 hours: James Rim. is a 77 y.o. male with medical history significant for insulin-dependent type 2 diabetes, multiple myeloma in remission, on Eliquis for recently diagnosed PE who has had multiple hospital admissions due to chronic diarrhea of unclear etiology resulting in electrolyte abnormalities. He was recently in the hospitalist service from 4/13 to 4/16 for the same reason. Presents to the ER with continued baseline diarrhea, but complaints of progressing RLQ abdominal pain. In the ED, vital signs stable, and labs with mild AKI. CT scan of the abdomen pelvis done showed possible appendiceal thickening. General surgery consulted. Pt admitted for further management.    Today, patient still complaining of abdominal pain, chronic diarrhea, reports some congestion, with some productive cough.  Noted mild bilateral wheezing    Assessment/Plan: Principal Problem:   Appendicitis Active Problems:   COPD (chronic obstructive pulmonary disease) (HCC)   Type 2 diabetes mellitus with complication, with long-term current use of insulin (HCC)   Chronic diarrhea   BPH (benign prostatic hyperplasia)   Essential hypertension   AKI (acute kidney injury) (HCC)   Possible appendicitis Currently afebrile, with no leukocytosis CT abdomen/pelvis showed possible appendiceal thickening Continue empiric IV antibiotics General surgery consulted, plan for surgery on 4/28 in the AM N.p.o. after midnight  COPD Noted some bilateral mild expiratory wheezes, with some chronic cough, likely his baseline Chest x-ray showed chronic lung disease with mild bibasilar atelectasis, otherwise unremarkable DuoNeb, continue inhalers Monitor closely  Hypokalemia Replace as needed  History of PE Eliquis currently on hold, switched to IV heparin drip Stop heparin drip on  4/28 at 1 AM for possible surgery  Chronic diarrhea Continue as needed Imodium, continue colestipol  Type 2 diabetes mellitus Last A1c on 08/23/2022 was 6 Continue SSI, Accu-Cheks, hypoglycemic protocol  Hypertension BP stable Continue amlodipine  BPH Continue finasteride, Flomax      Estimated body mass index is 26.61 kg/m as calculated from the following:   Height as of 11/20/22: 6' (1.829 m).   Weight as of this encounter: 89 kg.     Code Status: Full  Family Communication: Wife at bedside  Disposition Plan: Status is: Observation The patient will require care spanning > 2 midnights and should be moved to inpatient because: Level of care      Consultants: General surgery  Procedures: None  Antimicrobials: Zosyn  DVT prophylaxis: Heparin drip   Objective: Vitals:   11/25/22 0537 11/25/22 0857 11/25/22 0928 11/25/22 1217  BP: 117/62  (!) 127/57 116/63  Pulse: 68  82 72  Resp: 20  18 19   Temp: 98.4 F (36.9 C)  98.1 F (36.7 C) 98.6 F (37 C)  TempSrc: Oral  Axillary   SpO2: 98% 97% 100% 99%  Weight:        Intake/Output Summary (Last 24 hours) at 11/25/2022 1334 Last data filed at 11/25/2022 1116 Gross per 24 hour  Intake 1010.7 ml  Output 1200 ml  Net -189.3 ml   Filed Weights   11/24/22 1147  Weight: 89 kg    Exam: General: NAD  Cardiovascular: S1, S2 present Respiratory: Mild bilateral expiratory wheeze Abdomen: Soft, tender, nondistended, bowel sounds present Musculoskeletal: No bilateral pedal edema noted, noted extensive venous stasis changes Skin: As above Psychiatry: Normal mood     Data Reviewed: CBC: Recent Labs  Lab 11/24/22 1248 11/25/22 0451  WBC 4.0  3.7*  NEUTROABS 2.6  --   HGB 12.4* 11.1*  HCT 37.8* 34.7*  MCV 89.6 90.8  PLT 168 144*   Basic Metabolic Panel: Recent Labs  Lab 11/24/22 1248 11/25/22 0451  NA 135 138  K 4.2 3.1*  CL 102 105  CO2 22 22  GLUCOSE 159* 79  BUN 12 9  CREATININE 1.29*  1.14  CALCIUM 8.8* 8.3*  MG 2.0  --    GFR: Estimated Creatinine Clearance: 58.6 mL/min (by C-G formula based on SCr of 1.14 mg/dL). Liver Function Tests: Recent Labs  Lab 11/24/22 1248  AST 22  ALT 13  ALKPHOS 84  BILITOT 1.3*  PROT 7.6  ALBUMIN 4.1   Recent Labs  Lab 11/25/22 0451  LIPASE 29   No results for input(s): "AMMONIA" in the last 168 hours. Coagulation Profile: No results for input(s): "INR", "PROTIME" in the last 168 hours. Cardiac Enzymes: No results for input(s): "CKTOTAL", "CKMB", "CKMBINDEX", "TROPONINI" in the last 168 hours. BNP (last 3 results) No results for input(s): "PROBNP" in the last 8760 hours. HbA1C: No results for input(s): "HGBA1C" in the last 72 hours. CBG: Recent Labs  Lab 11/24/22 2235 11/25/22 0722 11/25/22 1217  GLUCAP 115* 81 103*   Lipid Profile: No results for input(s): "CHOL", "HDL", "LDLCALC", "TRIG", "CHOLHDL", "LDLDIRECT" in the last 72 hours. Thyroid Function Tests: No results for input(s): "TSH", "T4TOTAL", "FREET4", "T3FREE", "THYROIDAB" in the last 72 hours. Anemia Panel: No results for input(s): "VITAMINB12", "FOLATE", "FERRITIN", "TIBC", "IRON", "RETICCTPCT" in the last 72 hours. Urine analysis:    Component Value Date/Time   COLORURINE STRAW (A) 08/23/2022 1218   APPEARANCEUR CLEAR 08/23/2022 1218   LABSPEC 1.020 08/23/2022 1218   PHURINE 6.0 08/23/2022 1218   GLUCOSEU >=500 (A) 08/23/2022 1218   HGBUR MODERATE (A) 08/23/2022 1218   BILIRUBINUR NEGATIVE 08/23/2022 1218   KETONESUR 5 (A) 08/23/2022 1218   PROTEINUR 100 (A) 08/23/2022 1218   UROBILINOGEN 1.0 10/25/2011 1058   NITRITE NEGATIVE 08/23/2022 1218   LEUKOCYTESUR NEGATIVE 08/23/2022 1218   Sepsis Labs: @LABRCNTIP (procalcitonin:4,lacticidven:4)  )No results found for this or any previous visit (from the past 240 hour(s)).    Studies: CT ABDOMEN PELVIS WO CONTRAST  Result Date: 11/24/2022 CLINICAL DATA:  Diverticulitis. Epigastric pain with  diarrhea, congestion, and cough. History of multiple myeloma. EXAM: CT ABDOMEN AND PELVIS WITHOUT CONTRAST TECHNIQUE: Multidetector CT imaging of the abdomen and pelvis was performed following the standard protocol without IV contrast. RADIATION DOSE REDUCTION: This exam was performed according to the departmental dose-optimization program which includes automated exposure control, adjustment of the mA and/or kV according to patient size and/or use of iterative reconstruction technique. COMPARISON:  11/11/2022 FINDINGS: Poor signal to noise ratio and streak artifact in the upper abdomen due to arm positioning. Lower chest: Mild airway plugging in both lower lobes with mild atelectasis in the lower lobes and lingula. Descending thoracic aortic atherosclerosis. Mild cardiomegaly. Small pericardial effusion. Hepatobiliary: Stable hepatic cysts warranting no further imaging workup. Cholecystectomy. Pancreas: Unremarkable Spleen: Unremarkable Adrenals/Urinary Tract: 9 by 6 mm right kidney lower pole hypodense lesion with internal fat density posteriorly, compatible with small angiomyolipoma. No further imaging workup of this lesion is indicated. No hydronephrosis although there is mild distal left hydroureter extending to the UVJ. No urinary tract calculi identified. Adrenal glands unremarkable. Stomach/Bowel: Mild fusiform thickening of the appendix up to 0.8 cm in diameter on image 73 series 7, previously 0.7 cm. No periappendiceal inflammatory findings. Accentuated density along the appendiceal orifice  probably from barium contrast medium given absence on prior imaging. No dilated bowel. Vascular/Lymphatic: Atherosclerosis is present, including aortoiliac atherosclerotic disease. Reproductive: Unremarkable Other: No supplemental non-categorized findings. Musculoskeletal: Bony demineralization noted. Prominent previous compression fracture at L1 and moderate prior compression fractures at L2 and L4 appear unchanged.  Somewhat irregular lucency in the central S1 vertebra on image 49 series 2. Rectus diastasis with anterior abdominal wall laxity. IMPRESSION: 1. Mild abnormal fusiform thickening of the appendix up to 0.8 cm in diameter, previously 0.7 cm. No periappendiceal inflammatory findings. Correlate with specific right lower quadrant tenderness in further assessment for early acute appendicitis. 2. Mild airway plugging in both lower lobes with mild atelectasis in the lower lobes and lingula. 3. Mild cardiomegaly with small pericardial effusion. 4. Mild distal left hydroureter extending to the UVJ. No hydronephrosis or urinary tract calculi identified. 5. Bony demineralization with prior compression fractures at L1, L2, and L4. Somewhat irregular lucency in the central S1 vertebra. Some of the visualize lucencies may be a manifestation of the patient's myeloma. 6. Aortic atherosclerosis. Aortic Atherosclerosis (ICD10-I70.0). Electronically Signed   By: Gaylyn Rong M.D.   On: 11/24/2022 14:09   DG Chest 2 View  Result Date: 11/24/2022 CLINICAL DATA:  Wheezing. Epigastric pain with diarrhea, congestion, cough, chills, headache x3 weeks. EXAM: CHEST - 2 VIEW COMPARISON:  Radiographs 11/10/2022 and 08/23/2022.  CT 08/23/2022. FINDINGS: Suboptimal inspiration. The heart size and mediastinal contours are stable. Evidence of mild chronic lung disease with scattered scarring and mild bibasilar atelectasis. No airspace disease, edema, pleural effusion or pneumothorax identified. The bones are diffusely demineralized without evidence of acute osseous abnormality. IMPRESSION: Chronic lung disease with mild bibasilar atelectasis. No acute cardiopulmonary process identified. Electronically Signed   By: Carey Bullocks M.D.   On: 11/24/2022 13:44    Scheduled Meds:  amLODipine  10 mg Oral q morning   colestipol  2 g Oral BID   finasteride  5 mg Oral Daily   fluticasone furoate-vilanterol  1 puff Inhalation Daily   And    umeclidinium bromide  1 puff Inhalation Daily   insulin aspart  0-15 Units Subcutaneous TID WC   insulin aspart  0-5 Units Subcutaneous QHS   ipratropium-albuterol       morphine  30 mg Oral Q12H   pantoprazole  40 mg Oral BID   potassium chloride  40 mEq Oral BID   tamsulosin  0.4 mg Oral Daily    Continuous Infusions:  0.9 % NaCl with KCl 20 mEq / L 50 mL/hr at 11/25/22 0918   heparin     piperacillin-tazobactam (ZOSYN)  IV 3.375 g (11/25/22 0920)     LOS: 0 days     Briant Cedar, MD [facility]  If 7PM-7AM, please contact night-coverage [email or web address] 11/25/2022, 1:34 PM

## 2022-11-25 NOTE — Progress Notes (Signed)
Critical lab called by Teresa Coombs of [id number].  Pharmacy and attending notified thur secure chat.

## 2022-11-25 NOTE — Progress Notes (Addendum)
ANTICOAGULATION CONSULT NOTE - Initial Consult  Pharmacy Consult for heparin  Indication: bridge therapy while apixaban for PE on hold  Allergies  Allergen Reactions   Invokana [Canagliflozin] Anaphylaxis    Facial/neck/ lip swelling   Ace Inhibitors Cough   Patient Measurements: Weight: 89 kg (196 lb 3.4 oz) Heparin Dosing Weight: 89 kg  Vital Signs: Temp: 98.1 F (36.7 C) (04/27 0928) Temp Source: Axillary (04/27 0928) BP: 127/57 (04/27 0928) Pulse Rate: 82 (04/27 0928)  Labs: Recent Labs    11/24/22 1248 11/24/22 1810 11/24/22 2204 11/25/22 0451 11/25/22 1030  HGB 12.4*  --   --  11.1*  --   HCT 37.8*  --   --  34.7*  --   PLT 168  --   --  144*  --   APTT  --  38*  --   --  <20*  HEPARINUNFRC  --   --  >1.10*  --  >1.10*  CREATININE 1.29*  --   --  1.14  --      Estimated Creatinine Clearance: 58.6 mL/min (by C-G formula based on SCr of 1.14 mg/dL).   Medical History: Past Medical History:  Diagnosis Date   Anemia    Arthritis    BPH (benign prostatic hyperplasia)    CHF (congestive heart failure) (HCC)    Chronic pain    Diabetes mellitus    Dyspnea    GERD (gastroesophageal reflux disease)    Headache(784.0)    migraines, sinus headaches   Hepatitis    C and B-treated   HLD (hyperlipidemia)    Hypertension    Leukemia (HCC)    Lupus (HCC)    Multiple myeloma (HCC)    Pneumonia 07/2011   Sleep apnea 03/2018   going for fitting on 04-29-18   Thyroid disease    goiter   Urinary frequency    Assessment: 77 yo M on apixaban PTA for PE (08/23/2022) .  Pharmacy consulted to dose heparin for bridge therapy while apixaban on hold for possible early acute appendicitis. PTA on apixaban 5 mg po BID, last dose was early 4/26  Heparin level today remains high due to recent apixaban. APTT is undetectable at < 20. RN stated no issues with heparin running but there were some delays in drawing the lab. Unsure if heparin was paused and for how long with lab  draws. RN states the heparin is running at 19ml/hr now.  Goal of Therapy:  Heparin level 0.3-0.7 units/ml aPTT 66-102 seconds Monitor platelets by anticoagulation protocol: Yes   Plan:  Continue heparin at 1,500 units/hr Recheck APTT now Plan to stop heparin at 0100 tomorrow for appendectomy Monitor daily heparin level/aPTT, CBC, s/s of bleed  ADDENDUM: Recheck of aPTT is now elevated at 168. Level was drawn below the IV heparin infusion so seems as though this is accurate.  Plan: Decrease heparin to 1,200 units/hr and restart at 1845 No further checks as heparin will be stopped at 0100 tomorrow for surgery F/U post op for restarting anticoagulation   Enzo Bi, PharmD, BCPS, BCIDP Clinical Pharmacist 11/25/2022 12:21 PM

## 2022-11-25 NOTE — Progress Notes (Signed)
Subjective/Chief Complaint: Patient's pain about the same.  Location right lower quadrant and no better than yesterday.   Objective: Vital signs in last 24 hours: Temp:  [98 F (36.7 C)-99.5 F (37.5 C)] 98.1 F (36.7 C) (04/27 0928) Pulse Rate:  [68-99] 82 (04/27 0928) Resp:  [16-20] 18 (04/27 0928) BP: (117-145)/(57-69) 127/57 (04/27 0928) SpO2:  [95 %-100 %] 100 % (04/27 0928) Weight:  [89 kg] 89 kg (04/26 1147) Last BM Date : 11/24/22  Intake/Output from previous day: 04/26 0701 - 04/27 0700 In: 1010.7 [P.O.:720; I.V.:216.8; IV Piggyback:73.9] Out: 1000 [Urine:1000] Intake/Output this shift: No intake/output data recorded.  Abdomen: Tender right lower quadrant with rebound but no guarding.  Soft.  Umbilical hernia noted.  Lab Results:  Recent Labs    11/24/22 1248 11/25/22 0451  WBC 4.0 3.7*  HGB 12.4* 11.1*  HCT 37.8* 34.7*  PLT 168 144*   BMET Recent Labs    11/24/22 1248 11/25/22 0451  NA 135 138  K 4.2 3.1*  CL 102 105  CO2 22 22  GLUCOSE 159* 79  BUN 12 9  CREATININE 1.29* 1.14  CALCIUM 8.8* 8.3*   PT/INR No results for input(s): "LABPROT", "INR" in the last 72 hours. ABG No results for input(s): "PHART", "HCO3" in the last 72 hours.  Invalid input(s): "PCO2", "PO2"  Studies/Results: CT ABDOMEN PELVIS WO CONTRAST  Result Date: 11/24/2022 CLINICAL DATA:  Diverticulitis. Epigastric pain with diarrhea, congestion, and cough. History of multiple myeloma. EXAM: CT ABDOMEN AND PELVIS WITHOUT CONTRAST TECHNIQUE: Multidetector CT imaging of the abdomen and pelvis was performed following the standard protocol without IV contrast. RADIATION DOSE REDUCTION: This exam was performed according to the departmental dose-optimization program which includes automated exposure control, adjustment of the mA and/or kV according to patient size and/or use of iterative reconstruction technique. COMPARISON:  11/11/2022 FINDINGS: Poor signal to noise ratio and streak  artifact in the upper abdomen due to arm positioning. Lower chest: Mild airway plugging in both lower lobes with mild atelectasis in the lower lobes and lingula. Descending thoracic aortic atherosclerosis. Mild cardiomegaly. Small pericardial effusion. Hepatobiliary: Stable hepatic cysts warranting no further imaging workup. Cholecystectomy. Pancreas: Unremarkable Spleen: Unremarkable Adrenals/Urinary Tract: 9 by 6 mm right kidney lower pole hypodense lesion with internal fat density posteriorly, compatible with small angiomyolipoma. No further imaging workup of this lesion is indicated. No hydronephrosis although there is mild distal left hydroureter extending to the UVJ. No urinary tract calculi identified. Adrenal glands unremarkable. Stomach/Bowel: Mild fusiform thickening of the appendix up to 0.8 cm in diameter on image 73 series 7, previously 0.7 cm. No periappendiceal inflammatory findings. Accentuated density along the appendiceal orifice probably from barium contrast medium given absence on prior imaging. No dilated bowel. Vascular/Lymphatic: Atherosclerosis is present, including aortoiliac atherosclerotic disease. Reproductive: Unremarkable Other: No supplemental non-categorized findings. Musculoskeletal: Bony demineralization noted. Prominent previous compression fracture at L1 and moderate prior compression fractures at L2 and L4 appear unchanged. Somewhat irregular lucency in the central S1 vertebra on image 49 series 2. Rectus diastasis with anterior abdominal wall laxity. IMPRESSION: 1. Mild abnormal fusiform thickening of the appendix up to 0.8 cm in diameter, previously 0.7 cm. No periappendiceal inflammatory findings. Correlate with specific right lower quadrant tenderness in further assessment for early acute appendicitis. 2. Mild airway plugging in both lower lobes with mild atelectasis in the lower lobes and lingula. 3. Mild cardiomegaly with small pericardial effusion. 4. Mild distal left  hydroureter extending to the UVJ. No hydronephrosis or  urinary tract calculi identified. 5. Bony demineralization with prior compression fractures at L1, L2, and L4. Somewhat irregular lucency in the central S1 vertebra. Some of the visualize lucencies may be a manifestation of the patient's myeloma. 6. Aortic atherosclerosis. Aortic Atherosclerosis (ICD10-I70.0). Electronically Signed   By: Gaylyn Rong M.D.   On: 11/24/2022 14:09   DG Chest 2 View  Result Date: 11/24/2022 CLINICAL DATA:  Wheezing. Epigastric pain with diarrhea, congestion, cough, chills, headache x3 weeks. EXAM: CHEST - 2 VIEW COMPARISON:  Radiographs 11/10/2022 and 08/23/2022.  CT 08/23/2022. FINDINGS: Suboptimal inspiration. The heart size and mediastinal contours are stable. Evidence of mild chronic lung disease with scattered scarring and mild bibasilar atelectasis. No airspace disease, edema, pleural effusion or pneumothorax identified. The bones are diffusely demineralized without evidence of acute osseous abnormality. IMPRESSION: Chronic lung disease with mild bibasilar atelectasis. No acute cardiopulmonary process identified. Electronically Signed   By: Carey Bullocks M.D.   On: 11/24/2022 13:44    Anti-infectives: Anti-infectives (From admission, onward)    Start     Dose/Rate Route Frequency Ordered Stop   11/25/22 0000  piperacillin-tazobactam (ZOSYN) IVPB 3.375 g        3.375 g 12.5 mL/hr over 240 Minutes Intravenous Every 8 hours 11/24/22 2139     11/24/22 1715  piperacillin-tazobactam (ZOSYN) IVPB 3.375 g  Status:  Discontinued        3.375 g 100 mL/hr over 30 Minutes Intravenous Every 8 hours 11/24/22 1705 11/24/22 2138   11/24/22 1630  piperacillin-tazobactam (ZOSYN) IVPB 3.375 g        3.375 g 100 mL/hr over 30 Minutes Intravenous  Once 11/24/22 1617 11/24/22 1722       Assessment/Plan: Abdominal pain with findings concerning for appendicitis nonperforated  Discussed laparoscopy tomorrow.  By then  his Eliquis should wear off which would be 48 hours and we will hold his heparin drip starting at 1:00 tomorrow morning for potential OR in a.m. on 4/28  Discussed findings of appendicitis on CT scan but given his previous history and complex history, other things could be at play here.  Recommend laparoscopy with possible appendectomy and possible laparotomy depending on findings.  Risks and benefits discussed.  Risk of bleeding, infection, organ injury, bowel injury, abscess, fistula, hernia, exacerbation of underlying medical problems, death, DVT, and the need for the treatment center procedures reviewed with the patient and his wife today.  All questions were answered to the best my ability.   LOS: 0 days   FEN: CLD ID: Zosyn VTE: Hold Eliquis, okay for heparin drip -stop at 1 AM 11/26/2022 for possible OR   CHF DM Recent PE on Eliquis HTN HLD History of multiple myeloma   I reviewed last 24 h vitals and pain scores, last 48 h intake and output, last 24 h labs and trends, and last 24 h imaging results.  James [name] 11/25/2022 High complexity

## 2022-11-25 NOTE — TOC CM/SW Note (Signed)
Transition of Care [facility]) Screening Note  Patient Details  Name: James [name]. Date of Birth: 05/06/1946  Transition of Care [facility]) CM/SW Contact:    Ewing Schlein, LCSW Phone Number: 11/25/2022, 8:49 AM  Transition of Care Department [facility]) has reviewed patient and no TOC needs have been identified at this time. We will continue to monitor patient advancement through interdisciplinary progression rounds. If new patient transition needs arise, please place a TOC consult.

## 2022-11-26 ENCOUNTER — Observation Stay (HOSPITAL_BASED_OUTPATIENT_CLINIC_OR_DEPARTMENT_OTHER): Payer: Medicare HMO | Admitting: Anesthesiology

## 2022-11-26 ENCOUNTER — Other Ambulatory Visit: Payer: Self-pay

## 2022-11-26 ENCOUNTER — Encounter (HOSPITAL_COMMUNITY)
Admission: EM | Disposition: A | Discharge status: Home Health | Payer: Self-pay | Source: Home / Self Care | Attending: Emergency Medicine

## 2022-11-26 ENCOUNTER — Observation Stay (HOSPITAL_COMMUNITY): Payer: Medicare HMO | Admitting: Anesthesiology

## 2022-11-26 DIAGNOSIS — K353 Acute appendicitis with localized peritonitis, without perforation or gangrene: Secondary | ICD-10-CM | POA: Diagnosis not present

## 2022-11-26 DIAGNOSIS — Z87891 Personal history of nicotine dependence: Secondary | ICD-10-CM | POA: Diagnosis not present

## 2022-11-26 DIAGNOSIS — R109 Unspecified abdominal pain: Secondary | ICD-10-CM

## 2022-11-26 DIAGNOSIS — I110 Hypertensive heart disease with heart failure: Secondary | ICD-10-CM

## 2022-11-26 DIAGNOSIS — I509 Heart failure, unspecified: Secondary | ICD-10-CM | POA: Diagnosis not present

## 2022-11-26 HISTORY — PX: LAPAROSCOPIC APPENDECTOMY: SHX408

## 2022-11-26 LAB — CBC WITH DIFFERENTIAL/PLATELET
Abs Immature Granulocytes: 0.01 10*3/uL (ref 0.00–0.07)
Basophils Absolute: 0 10*3/uL (ref 0.0–0.1)
Basophils Relative: 1 %
Eosinophils Absolute: 0.4 10*3/uL (ref 0.0–0.5)
Eosinophils Relative: 13 %
HCT: 34.6 % — ABNORMAL LOW (ref 39.0–52.0)
Hemoglobin: 10.2 g/dL — ABNORMAL LOW (ref 13.0–17.0)
Immature Granulocytes: 0 %
Lymphocytes Relative: 28 %
Lymphs Abs: 0.8 10*3/uL (ref 0.7–4.0)
MCH: 29.1 pg (ref 26.0–34.0)
MCHC: 29.5 g/dL — ABNORMAL LOW (ref 30.0–36.0)
MCV: 98.9 fL (ref 80.0–100.0)
Monocytes Absolute: 0.3 10*3/uL (ref 0.1–1.0)
Monocytes Relative: 9 %
Neutro Abs: 1.4 10*3/uL — ABNORMAL LOW (ref 1.7–7.7)
Neutrophils Relative %: 49 %
Platelets: 124 10*3/uL — ABNORMAL LOW (ref 150–400)
RBC: 3.5 MIL/uL — ABNORMAL LOW (ref 4.22–5.81)
RDW: 16.9 % — ABNORMAL HIGH (ref 11.5–15.5)
WBC: 3 10*3/uL — ABNORMAL LOW (ref 4.0–10.5)
nRBC: 0 % (ref 0.0–0.2)

## 2022-11-26 LAB — BASIC METABOLIC PANEL
Anion gap: 11 (ref 5–15)
BUN: 6 mg/dL — ABNORMAL LOW (ref 8–23)
CO2: 20 mmol/L — ABNORMAL LOW (ref 22–32)
Calcium: 8.4 mg/dL — ABNORMAL LOW (ref 8.9–10.3)
Chloride: 107 mmol/L (ref 98–111)
Creatinine, Ser: 1.13 mg/dL (ref 0.61–1.24)
GFR, Estimated: >60 mL/min (ref >60)
Glucose, Bld: 92 mg/dL (ref 70–99)
Potassium: 4.1 mmol/L (ref 3.5–5.1)
Sodium: 138 mmol/L (ref 135–145)

## 2022-11-26 LAB — HIV ANTIBODY (ROUTINE TESTING W REFLEX): HIV Screen 4th Generation wRfx: NONREACTIVE

## 2022-11-26 LAB — GLUCOSE, CAPILLARY
Glucose-Capillary: 94 mg/dL (ref 70–99)
Glucose-Capillary: 177 mg/dL — ABNORMAL HIGH (ref 70–99)
Glucose-Capillary: 259 mg/dL — ABNORMAL HIGH (ref 70–99)

## 2022-11-26 SURGERY — APPENDECTOMY, LAPAROSCOPIC
Anesthesia: General | Site: Abdomen

## 2022-11-26 MED ORDER — ONDANSETRON HCL 4 MG/2ML IJ SOLN
INTRAMUSCULAR | Status: DC | PRN
  Administered 2022-11-26: 4 mg via INTRAVENOUS

## 2022-11-26 MED ORDER — PROPOFOL 10 MG/ML IV BOLUS
INTRAVENOUS | Status: DC | PRN
  Administered 2022-11-26: 120 mg via INTRAVENOUS

## 2022-11-26 MED ORDER — ONDANSETRON HCL 4 MG/2ML IJ SOLN
INTRAMUSCULAR | Status: AC
  Filled 2022-11-26: qty 8

## 2022-11-26 MED ORDER — OXYCODONE HCL 5 MG/5ML PO SOLN: 5.0000 mg | Freq: Once | ORAL | Status: DC | PRN

## 2022-11-26 MED ORDER — PIPERACILLIN-TAZOBACTAM 3.375 G IVPB
INTRAVENOUS | Status: AC
  Filled 2022-11-26: qty 50

## 2022-11-26 MED ORDER — OXYCODONE HCL 5 MG PO TABS
5.0000 mg | ORAL_TABLET | ORAL | Status: DC | PRN
  Administered 2022-11-27 – 2022-11-28 (×7): 5 mg via ORAL
  Filled 2022-11-26 (×7): qty 1

## 2022-11-26 MED ORDER — ACETAMINOPHEN 10 MG/ML IV SOLN
INTRAVENOUS | Status: DC | PRN
  Administered 2022-11-26: 1000 mg via INTRAVENOUS

## 2022-11-26 MED ORDER — BUPIVACAINE HCL 0.25 % IJ SOLN
INTRAMUSCULAR | Status: AC
  Filled 2022-11-26: qty 1

## 2022-11-26 MED ORDER — LACTATED RINGERS IR SOLN
Status: DC | PRN
  Administered 2022-11-26: 1000 mL

## 2022-11-26 MED ORDER — HEPARIN (PORCINE) 25000 UT/250ML-% IV SOLN
1200.0000 [IU]/h | INTRAVENOUS | Status: DC
  Administered 2022-11-26 – 2022-11-27 (×2): 1200 [IU]/h via INTRAVENOUS
  Filled 2022-11-26: qty 250

## 2022-11-26 MED ORDER — DEXAMETHASONE SODIUM PHOSPHATE 10 MG/ML IJ SOLN
INTRAMUSCULAR | Status: AC
  Filled 2022-11-26: qty 4

## 2022-11-26 MED ORDER — PHENYLEPHRINE HCL (PRESSORS) 10 MG/ML IV SOLN
INTRAVENOUS | Status: AC
  Filled 2022-11-26: qty 1

## 2022-11-26 MED ORDER — LIDOCAINE HCL (PF) 2 % IJ SOLN
INTRAMUSCULAR | Status: AC
  Filled 2022-11-26: qty 40

## 2022-11-26 MED ORDER — AMISULPRIDE (ANTIEMETIC) 5 MG/2ML IV SOLN: 10.0000 mg | Freq: Once | INTRAVENOUS | Status: DC

## 2022-11-26 MED ORDER — PHENYLEPHRINE 80 MCG/ML (10ML) SYRINGE FOR IV PUSH (FOR BLOOD PRESSURE SUPPORT)
PREFILLED_SYRINGE | INTRAVENOUS | Status: AC
  Filled 2022-11-26: qty 10

## 2022-11-26 MED ORDER — ALBUTEROL SULFATE HFA 108 (90 BASE) MCG/ACT IN AERS
INHALATION_SPRAY | RESPIRATORY_TRACT | Status: DC | PRN
  Administered 2022-11-26 (×4): 2 via RESPIRATORY_TRACT

## 2022-11-26 MED ORDER — ROCURONIUM BROMIDE 10 MG/ML (PF) SYRINGE
PREFILLED_SYRINGE | INTRAVENOUS | Status: DC | PRN
  Administered 2022-11-26: 40 mg via INTRAVENOUS
  Administered 2022-11-26: 10 mg via INTRAVENOUS

## 2022-11-26 MED ORDER — 0.9 % SODIUM CHLORIDE (POUR BTL) OPTIME
TOPICAL | Status: DC | PRN
  Administered 2022-11-26: 1000 mL

## 2022-11-26 MED ORDER — SUCCINYLCHOLINE CHLORIDE 200 MG/10ML IV SOSY
PREFILLED_SYRINGE | INTRAVENOUS | Status: DC | PRN
  Administered 2022-11-26: 140 mg via INTRAVENOUS

## 2022-11-26 MED ORDER — FENTANYL CITRATE PF 50 MCG/ML IJ SOSY
25.0000 ug | PREFILLED_SYRINGE | INTRAMUSCULAR | Status: DC | PRN
  Administered 2022-11-26 (×2): 50 ug via INTRAVENOUS

## 2022-11-26 MED ORDER — EPHEDRINE 5 MG/ML INJ
INTRAVENOUS | Status: AC
  Filled 2022-11-26: qty 10

## 2022-11-26 MED ORDER — BUPIVACAINE-EPINEPHRINE 0.25% -1:200000 IJ SOLN
INTRAMUSCULAR | Status: DC | PRN
  Administered 2022-11-26: 15 mL

## 2022-11-26 MED ORDER — MIDAZOLAM HCL 2 MG/2ML IJ SOLN
INTRAMUSCULAR | Status: DC | PRN
  Administered 2022-11-26 (×2): 1 mg via INTRAVENOUS

## 2022-11-26 MED ORDER — SUGAMMADEX SODIUM 200 MG/2ML IV SOLN
INTRAVENOUS | Status: DC | PRN
  Administered 2022-11-26: 400 mg via INTRAVENOUS

## 2022-11-26 MED ORDER — FENTANYL CITRATE (PF) 250 MCG/5ML IJ SOLN
INTRAMUSCULAR | Status: DC | PRN
  Administered 2022-11-26: 50 ug via INTRAVENOUS
  Administered 2022-11-26: 100 ug via INTRAVENOUS
  Administered 2022-11-26 (×2): 50 ug via INTRAVENOUS

## 2022-11-26 MED ORDER — LACTATED RINGERS IV SOLN: INTRAVENOUS | Status: DC | PRN

## 2022-11-26 MED ORDER — SUCCINYLCHOLINE CHLORIDE 200 MG/10ML IV SOSY
PREFILLED_SYRINGE | INTRAVENOUS | Status: AC
  Filled 2022-11-26: qty 40

## 2022-11-26 MED ORDER — DEXAMETHASONE SODIUM PHOSPHATE 10 MG/ML IJ SOLN
INTRAMUSCULAR | Status: DC | PRN
  Administered 2022-11-26: 5 mg via INTRAVENOUS

## 2022-11-26 MED ORDER — OXYCODONE HCL 5 MG PO TABS: 5.0000 mg | ORAL_TABLET | Freq: Once | ORAL | Status: DC | PRN

## 2022-11-26 MED ORDER — MIDAZOLAM HCL 2 MG/2ML IJ SOLN
INTRAMUSCULAR | Status: AC
  Filled 2022-11-26: qty 2

## 2022-11-26 MED ORDER — ACETAMINOPHEN 325 MG PO TABS: 325.0000 mg | ORAL_TABLET | ORAL | Status: DC | PRN

## 2022-11-26 MED ORDER — MEPERIDINE HCL 50 MG/ML IJ SOLN: 6.2500 mg | INTRAMUSCULAR | Status: DC | PRN

## 2022-11-26 MED ORDER — ALBUTEROL SULFATE HFA 108 (90 BASE) MCG/ACT IN AERS
INHALATION_SPRAY | RESPIRATORY_TRACT | Status: AC
  Filled 2022-11-26: qty 6.7

## 2022-11-26 MED ORDER — FENTANYL CITRATE PF 50 MCG/ML IJ SOSY
PREFILLED_SYRINGE | INTRAMUSCULAR | Status: AC
  Filled 2022-11-26: qty 2

## 2022-11-26 MED ORDER — ACETAMINOPHEN 160 MG/5ML PO SOLN: 325.0000 mg | ORAL | Status: DC | PRN

## 2022-11-26 MED ORDER — ROCURONIUM BROMIDE 10 MG/ML (PF) SYRINGE
PREFILLED_SYRINGE | INTRAVENOUS | Status: AC
  Filled 2022-11-26: qty 60

## 2022-11-26 MED ORDER — FENTANYL CITRATE (PF) 250 MCG/5ML IJ SOLN
INTRAMUSCULAR | Status: AC
  Filled 2022-11-26: qty 5

## 2022-11-26 MED ORDER — MORPHINE SULFATE (PF) 2 MG/ML IV SOLN
1.0000 mg | INTRAVENOUS | Status: DC | PRN
  Administered 2022-11-26 (×3): 2 mg via INTRAVENOUS
  Filled 2022-11-26 (×3): qty 1

## 2022-11-26 MED ORDER — LIDOCAINE 2% (20 MG/ML) 5 ML SYRINGE
INTRAMUSCULAR | Status: DC | PRN
  Administered 2022-11-26: 100 mg via INTRAVENOUS

## 2022-11-26 MED ORDER — AMISULPRIDE (ANTIEMETIC) 5 MG/2ML IV SOLN
INTRAVENOUS | Status: AC
  Filled 2022-11-26: qty 4

## 2022-11-26 SURGICAL SUPPLY — 42 items
ADH SKN CLS APL DERMABOND .7 (GAUZE/BANDAGES/DRESSINGS) ×1
APL PRP STRL LF DISP 70% ISPRP (MISCELLANEOUS) ×1
BAG COUNTER SPONGE SURGICOUNT (BAG) IMPLANT
BAG SPEC RTRVL 10 TROC 200 (ENDOMECHANICALS) ×1
BAG SPNG CNTER NS LX DISP (BAG)
CABLE HIGH FREQUENCY MONO STRZ (ELECTRODE) ×2 IMPLANT
CHLORAPREP W/TINT 26 (MISCELLANEOUS) ×2 IMPLANT
COVER SURGICAL LIGHT HANDLE (MISCELLANEOUS) ×2 IMPLANT
CUTTER FLEX LINEAR 45M (STAPLE) ×2 IMPLANT
DERMABOND ADVANCED .7 DNX12 (GAUZE/BANDAGES/DRESSINGS) ×2 IMPLANT
ELECT REM PT RETURN 15FT ADLT (MISCELLANEOUS) ×2 IMPLANT
GAUZE 4X4 16PLY ~~LOC~~+RFID DBL (SPONGE) ×2 IMPLANT
GLOVE BIO SURGEON STRL SZ7 (GLOVE) ×4 IMPLANT
GLOVE BIOGEL PI IND STRL 7.0 (GLOVE) ×2 IMPLANT
GLOVE BIOGEL PI IND STRL 7.5 (GLOVE) ×4 IMPLANT
GOWN STRL REUS W/ TWL LRG LVL3 (GOWN DISPOSABLE) ×6 IMPLANT
GOWN STRL REUS W/TWL LRG LVL3 (GOWN DISPOSABLE) ×3
GRASPER SUT TROCAR 14GX15 (MISCELLANEOUS) ×2 IMPLANT
IRRIG SUCT STRYKERFLOW 2 WTIP (MISCELLANEOUS) ×1
IRRIGATION SUCT STRKRFLW 2 WTP (MISCELLANEOUS) ×2 IMPLANT
KIT BASIN OR (CUSTOM PROCEDURE TRAY) ×2 IMPLANT
KIT TURNOVER KIT A (KITS) IMPLANT
NS IRRIG 1000ML POUR BTL (IV SOLUTION) ×2 IMPLANT
POUCH RETRIEVAL ECOSAC 10 (ENDOMECHANICALS) ×2 IMPLANT
POUCH RETRIEVAL ECOSAC 10MM (ENDOMECHANICALS) ×1
RELOAD 45 VASCULAR/THIN (ENDOMECHANICALS) IMPLANT
RELOAD STAPLE 45 2.5 WHT GRN (ENDOMECHANICALS) IMPLANT
RELOAD STAPLE 45 3.5 BLU ETS (ENDOMECHANICALS) IMPLANT
RELOAD STAPLE TA45 3.5 REG BLU (ENDOMECHANICALS) ×1 IMPLANT
SCISSORS LAP 5X35 DISP (ENDOMECHANICALS) IMPLANT
SET TUBE SMOKE EVAC HIGH FLOW (TUBING) ×2 IMPLANT
SHEARS HARMONIC ACE PLUS 36CM (ENDOMECHANICALS) ×2 IMPLANT
SLEEVE Z-THREAD 5X100MM (TROCAR) ×2 IMPLANT
SPIKE FLUID TRANSFER (MISCELLANEOUS) ×2 IMPLANT
STRIP CLOSURE SKIN 1/2X4 (GAUZE/BANDAGES/DRESSINGS) ×2 IMPLANT
SUT MNCRL AB 4-0 PS2 18 (SUTURE) ×2 IMPLANT
SUT VICRYL 0 UR6 27IN ABS (SUTURE) ×2 IMPLANT
TOWEL OR 17X26 10 PK STRL BLUE (TOWEL DISPOSABLE) ×2 IMPLANT
TRAY FOLEY MTR SLVR 16FR STAT (SET/KITS/TRAYS/PACK) ×2 IMPLANT
TRAY LAPAROSCOPIC (CUSTOM PROCEDURE TRAY) ×2 IMPLANT
TROCAR BALLN 12MMX100 BLUNT (TROCAR) ×2 IMPLANT
TROCAR Z-THREAD FIOS 5X100MM (TROCAR) ×2 IMPLANT

## 2022-11-26 NOTE — Anesthesia Procedure Notes (Signed)
Date/Time: 11/26/2022 11:57 AM  Performed by: Minerva Ends, CRNAOxygen Delivery Method: Simple face mask Placement Confirmation: positive ETCO2 and breath sounds checked- equal and bilateral Dental Injury: Teeth and Oropharynx as per pre-operative assessment

## 2022-11-26 NOTE — Transfer of Care (Signed)
Immediate Anesthesia Transfer of Care Note  Patient: James [name].  Procedure(s) Performed: APPENDECTOMY LAPAROSCOPIC (Abdomen)  Patient Location: PACU  Anesthesia Type:General  Level of Consciousness: awake  Airway & Oxygen Therapy: Patient Spontanous Breathing and Patient connected to face mask oxygen  Post-op Assessment: Report given to RN and Post -op Vital signs reviewed and stable  Post vital signs: Reviewed and stable  Last Vitals:  Vitals Value Taken Time  BP    Temp    Pulse    Resp    SpO2      Last Pain:  Vitals:   11/26/22 0539  TempSrc: Oral  PainSc:       Patients Stated Pain Goal: 0 (11/25/22 0900)  Complications: No notable events documented.

## 2022-11-26 NOTE — Anesthesia Procedure Notes (Signed)
Procedure Name: Intubation Date/Time: 11/26/2022 11:14 AM  Performed by: Minerva Ends, CRNAPre-anesthesia Checklist: Patient identified, Emergency Drugs available, Suction available and Patient being monitored Patient Re-evaluated:Patient Re-evaluated prior to induction Oxygen Delivery Method: Circle System Utilized Preoxygenation: Pre-oxygenation with 100% oxygen Induction Type: IV induction, Rapid sequence and Cricoid Pressure applied Ventilation: Mask ventilation without difficulty Laryngoscope Size: Miller and 2 Grade View: Grade I Tube type: Oral Number of attempts: 1 Airway Equipment and Method: Stylet and Oral airway Placement Confirmation: ETT inserted through vocal cords under direct vision, positive ETCO2 and breath sounds checked- equal and bilateral Secured at: 21 cm Tube secured with: Tape Dental Injury: Teeth and Oropharynx as per pre-operative assessment  Comments: Smooth IV induction Oddono-- intubation AM CRNA atraumatic-- teeth and mouth as preop-- many missing teeth-- mouth unchanged- bilat BS

## 2022-11-26 NOTE — Anesthesia Preprocedure Evaluation (Addendum)
Anesthesia Evaluation  Patient identified by MRN, date of birth, ID band Patient awake    Reviewed: Allergy & Precautions, NPO status , Patient's Chart, lab work & pertinent test results  Airway Mallampati: II  TM Distance: >3 FB Neck ROM: Full    Dental  (+) Loose, Missing,  Gold right upper central:   Pulmonary sleep apnea , former smoker   Pulmonary exam normal breath sounds clear to auscultation       Cardiovascular hypertension, Pt. on medications +CHF  Normal cardiovascular exam(-) dysrhythmias  Rhythm:Regular Rate:Normal  ECG: rate 84.  Normal sinus rhythm Left axis deviation Right bundle branch block   Neuro/Psych  Headaches  negative psych ROS   GI/Hepatic ,GERD  Medicated and Controlled,,(+) Hepatitis -, C  Endo/Other  diabetes, Insulin Dependent    Renal/GU negative Renal ROS     Musculoskeletal  (+) Arthritis ,    Abdominal   Peds  Hematology  (+) Blood dyscrasia, anemia Multiple myeloma   Anesthesia Other Findings   Reproductive/Obstetrics                             Anesthesia Physical Anesthesia Plan  ASA: 3 and emergent  Anesthesia Plan: General   Post-op Pain Management: Toradol IV (intra-op)* and Ofirmev IV (intra-op)*   Induction: Intravenous and Cricoid pressure planned  PONV Risk Score and Plan: 2 and Ondansetron, Dexamethasone and Treatment may vary due to age or medical condition  Airway Management Planned: Oral ETT  Additional Equipment: None  Intra-op Plan:   Post-operative Plan: Extubation in OR  Informed Consent: I have reviewed the patients History and Physical, chart, labs and discussed the procedure including the risks, benefits and alternatives for the proposed anesthesia with the patient or authorized representative who has indicated his/her understanding and acceptance.     Dental advisory given  Plan Discussed with: CRNA and  Anesthesiologist  Anesthesia Plan Comments: (PAT note by Antionette Poles, PA-C: Long history of uncontrolled IDDM 2.   Follows with oncologist Dr. Milinda Cave for multiple myeloma diagnosed 2017.  He is maintained on Revlimid and dexamethasone. History of OSA, not on CPAP. EKG 02/16/2021: Normal sinus rhythm.  Rate 84. Left axis deviation. Right bundle branch block. Minimal voltage criteria for LVH, may be normal variant Nuclear stress 12/11/2018:   Nuclear stress EF: 63%. The left ventricular ejection fraction is normal (55-65%).  There was no ST segment deviation noted during stress.  This is a low risk study. There is no evidence of ischemia or infarction  The study is normal.   ECHO 1/24  1. Left ventricular ejection fraction, by estimation, is 60 to 65%. The  left ventricle has normal function. The left ventricle has no regional  wall motion abnormalities. Left ventricular diastolic parameters are  indeterminate.   2. Right ventricular systolic function is normal. The right ventricular  size is normal.   3. The mitral valve is abnormal. No evidence of mitral valve  regurgitation. No evidence of mitral stenosis.   4. The aortic valve is normal in structure. There is mild calcification  of the aortic valve. There is mild thickening of the aortic valve. Aortic  valve regurgitation is not visualized. Aortic valve sclerosis is present,  with no evidence of aortic valve  stenosis.   5. The inferior vena cava is normal in size with greater than 50%  respiratory variability, suggesting right atrial pressure of 3 mmHg.   )  Anesthesia Quick Evaluation  

## 2022-11-26 NOTE — Progress Notes (Signed)
PROGRESS NOTE  James Rim. WUJ:[id number] DOB: 1945/08/13 DOA: 11/24/2022 PCP: Wilmer Floor, NP   HPI/Recap of past 24 hours: James Rim. is a 77 y.o. male with medical history significant for insulin-dependent type 2 diabetes, multiple myeloma in remission, on Eliquis for recently diagnosed PE who has had multiple hospital admissions due to chronic diarrhea of unclear etiology resulting in electrolyte abnormalities. He was recently in the hospitalist service from 4/13 to 4/16 for the same reason. Presents to the ER with continued baseline diarrhea, but complaints of progressing RLQ abdominal pain. In the ED, vital signs stable, and labs with mild AKI. CT scan of the abdomen pelvis done showed possible appendiceal thickening. General surgery consulted. Pt admitted for further management.    Today, patient denies any new complaints, denies any chest pain, worsening shortness of breath, nausea/vomiting, fever/chills, worsening abdominal pain.  Saw patient prior to his surgery    Assessment/Plan: Principal Problem:   Appendicitis Active Problems:   COPD (chronic obstructive pulmonary disease) (HCC)   Type 2 diabetes mellitus with complication, with long-term current use of insulin (HCC)   Chronic diarrhea   BPH (benign prostatic hyperplasia)   Essential hypertension   AKI (acute kidney injury) (HCC)   Acute appendicitis s/p laparoscopic appendectomy on 4/28 Currently afebrile, with leukopenia CT abdomen/pelvis showed possible appendiceal thickening Continue IV antibiotics General surgery consulted, s/p surgery on 4/28 Monitor closely  COPD Noted some bilateral mild expiratory wheezes, with some chronic cough, likely his baseline Chest x-ray showed chronic lung disease with mild bibasilar atelectasis, otherwise unremarkable DuoNeb, continue inhalers Monitor closely  Hypokalemia Replace as needed  History of PE Eliquis currently on hold, switched to IV  heparin drip Restart heparin drip as per surgery  Chronic diarrhea Continue as needed Imodium, continue colestipol  Type 2 diabetes mellitus Last A1c on 08/23/2022 was 6 Continue SSI, Accu-Cheks, hypoglycemic protocol  Hypertension BP stable Continue amlodipine  BPH Continue finasteride, Flomax      Estimated body mass index is 26.61 kg/m as calculated from the following:   Height as of 11/20/22: 6' (1.829 m).   Weight as of this encounter: 89 kg.     Code Status: Full  Family Communication: Wife at bedside  Disposition Plan: Status is: Observation The patient will require care spanning > 2 midnights and should be moved to inpatient because: Level of care      Consultants: General surgery  Procedures: Laparoscopic appendectomy  Antimicrobials: Zosyn  DVT prophylaxis: Heparin drip   Objective: Vitals:   11/26/22 1210 11/26/22 1218 11/26/22 1230 11/26/22 1237  BP: (!) 157/76 (!) 161/72 (!) 164/75 (!) 144/68  Pulse: 76 77 77   Resp: (!) 22 20 16    Temp:      TempSrc:      SpO2: 100% 100% 100%   Weight:        Intake/Output Summary (Last 24 hours) at 11/26/2022 1321 Last data filed at 11/26/2022 1246 Gross per 24 hour  Intake 1192.4 ml  Output 1160 ml  Net 32.4 ml   Filed Weights   11/24/22 1147  Weight: 89 kg    Exam: General: NAD  Cardiovascular: S1, S2 present Respiratory: Diminished breath sounds bilaterally Abdomen: Soft, tender, nondistended, bowel sounds present Musculoskeletal: No bilateral pedal edema noted, noted extensive venous stasis changes on BLE Skin: As above Psychiatry: Normal mood     Data Reviewed: CBC: Recent Labs  Lab 11/24/22 1248 11/25/22 0451 11/26/22 0505  WBC 4.0 3.7* 3.0*  NEUTROABS 2.6  --  1.4*  HGB 12.4* 11.1* 10.2*  HCT 37.8* 34.7* 34.6*  MCV 89.6 90.8 98.9  PLT 168 144* 124*   Basic Metabolic Panel: Recent Labs  Lab 11/24/22 1248 11/25/22 0451 11/25/22 1458 11/26/22 0754  NA 135 138  --   138  K 4.2 3.1*  --  4.1  CL 102 105  --  107  CO2 22 22  --  20*  GLUCOSE 159* 79  --  92  BUN 12 9  --  6*  CREATININE 1.29* 1.14  --  1.13  CALCIUM 8.8* 8.3*  --  8.4*  MG 2.0  --  2.0  --    GFR: Estimated Creatinine Clearance: 59.1 mL/min (by C-G formula based on SCr of 1.13 mg/dL). Liver Function Tests: Recent Labs  Lab 11/24/22 1248  AST 22  ALT 13  ALKPHOS 84  BILITOT 1.3*  PROT 7.6  ALBUMIN 4.1   Recent Labs  Lab 11/25/22 0451  LIPASE 29   No results for input(s): "AMMONIA" in the last 168 hours. Coagulation Profile: No results for input(s): "INR", "PROTIME" in the last 168 hours. Cardiac Enzymes: No results for input(s): "CKTOTAL", "CKMB", "CKMBINDEX", "TROPONINI" in the last 168 hours. BNP (last 3 results) No results for input(s): "PROBNP" in the last 8760 hours. HbA1C: No results for input(s): "HGBA1C" in the last 72 hours. CBG: Recent Labs  Lab 11/25/22 0722 11/25/22 1217 11/25/22 1651 11/25/22 2147 11/26/22 0758  GLUCAP 81 103* 110* 125* 94   Lipid Profile: No results for input(s): "CHOL", "HDL", "LDLCALC", "TRIG", "CHOLHDL", "LDLDIRECT" in the last 72 hours. Thyroid Function Tests: No results for input(s): "TSH", "T4TOTAL", "FREET4", "T3FREE", "THYROIDAB" in the last 72 hours. Anemia Panel: No results for input(s): "VITAMINB12", "FOLATE", "FERRITIN", "TIBC", "IRON", "RETICCTPCT" in the last 72 hours. Urine analysis:    Component Value Date/Time   COLORURINE STRAW (A) 08/23/2022 1218   APPEARANCEUR CLEAR 08/23/2022 1218   LABSPEC 1.020 08/23/2022 1218   PHURINE 6.0 08/23/2022 1218   GLUCOSEU >=500 (A) 08/23/2022 1218   HGBUR MODERATE (A) 08/23/2022 1218   BILIRUBINUR NEGATIVE 08/23/2022 1218   KETONESUR 5 (A) 08/23/2022 1218   PROTEINUR 100 (A) 08/23/2022 1218   UROBILINOGEN 1.0 10/25/2011 1058   NITRITE NEGATIVE 08/23/2022 1218   LEUKOCYTESUR NEGATIVE 08/23/2022 1218   Sepsis Labs: @LABRCNTIP (procalcitonin:4,lacticidven:4)  )No  results found for this or any previous visit (from the past 240 hour(s)).    Studies: No results found.  Scheduled Meds:  amisulpride       amLODipine  10 mg Oral q morning   colestipol  2 g Oral BID   fentaNYL       finasteride  5 mg Oral Daily   fluticasone furoate-vilanterol  1 puff Inhalation Daily   And   umeclidinium bromide  1 puff Inhalation Daily   insulin aspart  0-15 Units Subcutaneous TID WC   insulin aspart  0-5 Units Subcutaneous QHS   ipratropium-albuterol  3 mL Nebulization TID   morphine  30 mg Oral Q12H   pantoprazole  40 mg Oral BID   tamsulosin  0.4 mg Oral Daily    Continuous Infusions:  heparin     piperacillin-tazobactam (ZOSYN)  IV 3.375 g (11/25/22 2347)     LOS: 0 days     Briant Cedar, MD [facility]  If 7PM-7AM, please contact night-coverage [email or web address] 11/26/2022, 1:21 PM

## 2022-11-26 NOTE — Op Note (Signed)
Preoperative diagnosis: Abdominal pain with CT scan concerning for possible appendicitis Postoperative diagnosis: Same as above Procedure: Laparoscopic appendectomy Surgeon: Dr. Harden Mo Anesthesia: General Complications: None Drains: None Estimated blood loss: Minimal Specimens: Appendix and small cuff of cecum to pathology Sponge needle count was correct completion Disposition to recovery stable condition  Indications: This is a 77 year old male who has multiple medical issues who has had some abdominal pain for several weeks.  He had an evaluation when he came in the emergency room showed thickening of his appendix with a normal white blood cell count.  This showed 8 mm of some mild abnormal fusiform thickening of the appendix.  He has continued to have right lower quadrant pain and we discussed proceeding with appendectomy and laparoscopy.  Procedure: After informed consent was obtained he was taken to the operating room.  His heparin had been turned off.  He was already on antibiotics.  SCDs were placed.  He was placed under general anesthesia without complication.  He was prepped and draped in the standard sterile surgical fashion.  A surgical timeout was then performed.  I made a vertical incision below his umbilicus.  I grasped the fascia and incised this sharply.  The peritoneum was entered bluntly.  I placed a 0 Vicryl suture through the fascia.  I inserted a Hassan trocar and insufflated the abdomen to 15 mmHg pressure.  I placed 2 additional 5 mm trocars in the lower abdomen under direct vision without complication.  He did appear to have a small right groin hernia.  There was nothing incarcerated in that.  The remainder the abdomen was without abnormality.  I was able to identify his appendix.  His right colon is adherent to his abdominal wall as he has had an open cholecystectomy before.  I was able to identify the appendix.  It did appear to be abnormal.  It was dilated and  appeared inflamed.  I was able to divide the appendiceal mesentery with the harmonic scalpel.  The base was then identified.  The thickening went up to the base of the appendix so I elected to take a small cuff of cecum with this.  I used 1 stapler load and came across this.  This was then placed in a retrieval bag and removed.  Hemostasis was observed.  I then removed the [facility] trocar and time I pursestring down.  I placed an additional 0 Vicryl suture to completely obliterate that defect.  The remaining trocars were removed and the abdomen desufflated.  These were closed with 4 Monocryl and glue.  He tolerated this well was extubated transferred recovery stable.  I have written an order to restart his heparin at 2000 tonight.

## 2022-11-26 NOTE — Progress Notes (Signed)
ANTICOAGULATION CONSULT NOTE - Initial Consult  Pharmacy Consult for heparin  Indication: bridge therapy while apixaban for PE on hold  Allergies  Allergen Reactions   Invokana [Canagliflozin] Anaphylaxis    Facial/neck/ lip swelling   Ace Inhibitors Cough   Patient Measurements: Weight: 89 kg (196 lb 3.4 oz) Heparin Dosing Weight: 89 kg  Vital Signs: Temp: 98.3 F (36.8 C) (04/28 0539) Temp Source: Oral (04/28 0539) BP: 144/68 (04/28 1237) Pulse Rate: 77 (04/28 1230)  Labs: Recent Labs    11/24/22 1248 11/24/22 1810 11/24/22 2204 11/25/22 0451 11/25/22 1030 11/25/22 1458 11/26/22 0505 11/26/22 0754  HGB 12.4*  --   --  11.1*  --   --  10.2*  --   HCT 37.8*  --   --  34.7*  --   --  34.6*  --   PLT 168  --   --  144*  --   --  124*  --   APTT  --  38*  --   --  <20* 168*  --   --   HEPARINUNFRC  --   --  >1.10*  --  >1.10*  --   --   --   CREATININE 1.29*  --   --  1.14  --   --   --  1.13     Estimated Creatinine Clearance: 59.1 mL/min (by C-G formula based on SCr of 1.13 mg/dL).   Medical History: Past Medical History:  Diagnosis Date   Anemia    Arthritis    BPH (benign prostatic hyperplasia)    CHF (congestive heart failure) (HCC)    Chronic pain    Diabetes mellitus    Dyspnea    GERD (gastroesophageal reflux disease)    Headache(784.0)    migraines, sinus headaches   Hepatitis    C and B-treated   HLD (hyperlipidemia)    Hypertension    Leukemia (HCC)    Lupus (HCC)    Multiple myeloma (HCC)    Pneumonia 07/2011   Sleep apnea 03/2018   going for fitting on 04-29-18   Thyroid disease    goiter   Urinary frequency    Assessment: 77 yo M on apixaban PTA for PE (08/23/2022) .  Pharmacy consulted to dose heparin for bridge therapy while apixaban on hold for possible early acute appendicitis. PTA on apixaban 5 mg po BID, last dose was early 4/26  Now s/p appendectomy. Surgery would like heparin to be restarted at 2000 tonight. Heparin level was  still falsely elevated on 4/27 due to recent apixaban use so using aPTTs.  Goal of Therapy:  Heparin level 0.3-0.7 units/ml aPTT 66-102 seconds Monitor platelets by anticoagulation protocol: Yes   Plan:  Restart heparin at 1,200 units/hr at 2000 tonight Check heparin level and aPTT with morning labs tomorrow Monitor CBC, s/s of bleed  Patient has been difficult stick this admit causing issues with lab monitoring. If issues continue might want to consider a low dose enoxaparin until ready to restart apixaban if ok with surgery and primary  Enzo Bi, PharmD, BCPS, BCIDP Clinical Pharmacist 11/26/2022 1:26 PM

## 2022-11-26 NOTE — Anesthesia Postprocedure Evaluation (Signed)
Anesthesia Post Note  Patient: James [name].  Procedure(s) Performed: APPENDECTOMY LAPAROSCOPIC (Abdomen)     Patient location during evaluation: PACU Anesthesia Type: General Level of consciousness: awake and alert Pain management: pain level controlled Vital Signs Assessment: post-procedure vital signs reviewed and stable Respiratory status: spontaneous breathing, nonlabored ventilation, respiratory function stable and patient connected to nasal cannula oxygen Cardiovascular status: blood pressure returned to baseline and stable Postop Assessment: no apparent nausea or vomiting Anesthetic complications: no   No notable events documented.  Last Vitals:  Vitals:   11/26/22 1434 11/26/22 1548  BP: (!) 144/68 137/66  Pulse: 75 73  Resp: 17 17  Temp: 36.9 C 36.9 C  SpO2: 100% 98%    Last Pain:  Vitals:   11/26/22 1611  TempSrc:   PainSc: 10-Worst pain ever                 Laryah Neuser

## 2022-11-26 NOTE — Progress Notes (Signed)
Subjective/Chief Complaint: Still with rlq pain, heparin off at 0100   Objective: Vital signs in last 24 hours: Temp:  [98.1 F (36.7 C)-98.6 F (37 C)] 98.3 F (36.8 C) (04/28 0539) Pulse Rate:  [62-82] 62 (04/28 0539) Resp:  [17-19] 18 (04/28 0539) BP: (116-127)/(57-69) 125/69 (04/28 0539) SpO2:  [95 %-100 %] 100 % (04/28 0539) Last BM Date : 11/24/22  Intake/Output from previous day: 04/27 0701 - 04/28 0700 In: 181.5 [IV Piggyback:181.5] Out: 1350 [Urine:1350] Intake/Output this shift: No intake/output data recorded.  Ab ruq scar, tender rlq   Lab Results:  Recent Labs    11/25/22 0451 11/26/22 0505  WBC 3.7* 3.0*  HGB 11.1* 10.2*  HCT 34.7* 34.6*  PLT 144* 124*   BMET Recent Labs    11/24/22 1248 11/25/22 0451  NA 135 138  K 4.2 3.1*  CL 102 105  CO2 22 22  GLUCOSE 159* 79  BUN 12 9  CREATININE 1.29* 1.14  CALCIUM 8.8* 8.3*   PT/INR No results for input(s): "LABPROT", "INR" in the last 72 hours. ABG No results for input(s): "PHART", "HCO3" in the last 72 hours.  Invalid input(s): "PCO2", "PO2"  Studies/Results: CT ABDOMEN PELVIS WO CONTRAST  Result Date: 11/24/2022 CLINICAL DATA:  Diverticulitis. Epigastric pain with diarrhea, congestion, and cough. History of multiple myeloma. EXAM: CT ABDOMEN AND PELVIS WITHOUT CONTRAST TECHNIQUE: Multidetector CT imaging of the abdomen and pelvis was performed following the standard protocol without IV contrast. RADIATION DOSE REDUCTION: This exam was performed according to the departmental dose-optimization program which includes automated exposure control, adjustment of the mA and/or kV according to patient size and/or use of iterative reconstruction technique. COMPARISON:  11/11/2022 FINDINGS: Poor signal to noise ratio and streak artifact in the upper abdomen due to arm positioning. Lower chest: Mild airway plugging in both lower lobes with mild atelectasis in the lower lobes and lingula. Descending  thoracic aortic atherosclerosis. Mild cardiomegaly. Small pericardial effusion. Hepatobiliary: Stable hepatic cysts warranting no further imaging workup. Cholecystectomy. Pancreas: Unremarkable Spleen: Unremarkable Adrenals/Urinary Tract: 9 by 6 mm right kidney lower pole hypodense lesion with internal fat density posteriorly, compatible with small angiomyolipoma. No further imaging workup of this lesion is indicated. No hydronephrosis although there is mild distal left hydroureter extending to the UVJ. No urinary tract calculi identified. Adrenal glands unremarkable. Stomach/Bowel: Mild fusiform thickening of the appendix up to 0.8 cm in diameter on image 73 series 7, previously 0.7 cm. No periappendiceal inflammatory findings. Accentuated density along the appendiceal orifice probably from barium contrast medium given absence on prior imaging. No dilated bowel. Vascular/Lymphatic: Atherosclerosis is present, including aortoiliac atherosclerotic disease. Reproductive: Unremarkable Other: No supplemental non-categorized findings. Musculoskeletal: Bony demineralization noted. Prominent previous compression fracture at L1 and moderate prior compression fractures at L2 and L4 appear unchanged. Somewhat irregular lucency in the central S1 vertebra on image 49 series 2. Rectus diastasis with anterior abdominal wall laxity. IMPRESSION: 1. Mild abnormal fusiform thickening of the appendix up to 0.8 cm in diameter, previously 0.7 cm. No periappendiceal inflammatory findings. Correlate with specific right lower quadrant tenderness in further assessment for early acute appendicitis. 2. Mild airway plugging in both lower lobes with mild atelectasis in the lower lobes and lingula. 3. Mild cardiomegaly with small pericardial effusion. 4. Mild distal left hydroureter extending to the UVJ. No hydronephrosis or urinary tract calculi identified. 5. Bony demineralization with prior compression fractures at L1, L2, and L4. Somewhat  irregular lucency in the central S1 vertebra. Some of the  visualize lucencies may be a manifestation of the patient's myeloma. 6. Aortic atherosclerosis. Aortic Atherosclerosis (ICD10-I70.0). Electronically Signed   By: Gaylyn Rong M.D.   On: 11/24/2022 14:09   DG Chest 2 View  Result Date: 11/24/2022 CLINICAL DATA:  Wheezing. Epigastric pain with diarrhea, congestion, cough, chills, headache x3 weeks. EXAM: CHEST - 2 VIEW COMPARISON:  Radiographs 11/10/2022 and 08/23/2022.  CT 08/23/2022. FINDINGS: Suboptimal inspiration. The heart size and mediastinal contours are stable. Evidence of mild chronic lung disease with scattered scarring and mild bibasilar atelectasis. No airspace disease, edema, pleural effusion or pneumothorax identified. The bones are diffusely demineralized without evidence of acute osseous abnormality. IMPRESSION: Chronic lung disease with mild bibasilar atelectasis. No acute cardiopulmonary process identified. Electronically Signed   By: Carey Bullocks M.D.   On: 11/24/2022 13:44    Anti-infectives: Anti-infectives (From admission, onward)    Start     Dose/Rate Route Frequency Ordered Stop   11/25/22 0000  piperacillin-tazobactam (ZOSYN) IVPB 3.375 g        3.375 g 12.5 mL/hr over 240 Minutes Intravenous Every 8 hours 11/24/22 2139     11/24/22 1715  piperacillin-tazobactam (ZOSYN) IVPB 3.375 g  Status:  Discontinued        3.375 g 100 mL/hr over 30 Minutes Intravenous Every 8 hours 11/24/22 1705 11/24/22 2138   11/24/22 1630  piperacillin-tazobactam (ZOSYN) IVPB 3.375 g        3.375 g 100 mL/hr over 30 Minutes Intravenous  Once 11/24/22 1617 11/24/22 1722       Assessment/Plan: Abdominal pain with findings concerning for appendicitis nonperforated   Will proceed with lap appy today.  Heparin has been off.    Discussed findings of appendicitis on CT scan but given his previous history and complex history.  Recommend laparoscopy with possible appendectomy and  possible laparotomy depending on findings.  Risks and benefits discussed.   Risk of bleeding, infection, organ injury, bowel injury, abscess, fistula, hernia, exacerbation of underlying medical problems, death, DVT,    All questions were answered to the best my ability.    LOS: 0 days   FEN: CLD ID: [email or web address] VTE: heparin off CHF DM Recent PE on Eliquis HTN HLD History of multiple myeloma   James [name] 11/26/2022

## 2022-11-27 ENCOUNTER — Encounter (HOSPITAL_COMMUNITY): Payer: Self-pay | Admitting: General Surgery

## 2022-11-27 DIAGNOSIS — K353 Acute appendicitis with localized peritonitis, without perforation or gangrene: Secondary | ICD-10-CM | POA: Diagnosis not present

## 2022-11-27 LAB — GLUCOSE, CAPILLARY
Glucose-Capillary: 137 mg/dL — ABNORMAL HIGH (ref 70–99)
Glucose-Capillary: 118 mg/dL — ABNORMAL HIGH (ref 70–99)
Glucose-Capillary: 163 mg/dL — ABNORMAL HIGH (ref 70–99)
Glucose-Capillary: 138 mg/dL — ABNORMAL HIGH (ref 70–99)

## 2022-11-27 LAB — BASIC METABOLIC PANEL
Anion gap: 9 (ref 5–15)
BUN: 9 mg/dL (ref 8–23)
CO2: 23 mmol/L (ref 22–32)
Calcium: 8.8 mg/dL — ABNORMAL LOW (ref 8.9–10.3)
Chloride: 105 mmol/L (ref 98–111)
Creatinine, Ser: 1.13 mg/dL (ref 0.61–1.24)
GFR, Estimated: >60 mL/min (ref >60)
Glucose, Bld: 160 mg/dL — ABNORMAL HIGH (ref 70–99)
Potassium: 4.3 mmol/L (ref 3.5–5.1)
Sodium: 137 mmol/L (ref 135–145)

## 2022-11-27 LAB — CBC WITH DIFFERENTIAL/PLATELET
Abs Immature Granulocytes: 0.01 10*3/uL (ref 0.00–0.07)
Basophils Absolute: 0 10*3/uL (ref 0.0–0.1)
Basophils Relative: 0 %
Eosinophils Absolute: 0 10*3/uL (ref 0.0–0.5)
Eosinophils Relative: 0 %
HCT: 35.6 % — ABNORMAL LOW (ref 39.0–52.0)
Hemoglobin: 11.4 g/dL — ABNORMAL LOW (ref 13.0–17.0)
Immature Granulocytes: 0 %
Lymphocytes Relative: 14 %
Lymphs Abs: 0.7 10*3/uL (ref 0.7–4.0)
MCH: 28.9 pg (ref 26.0–34.0)
MCHC: 32 g/dL (ref 30.0–36.0)
MCV: 90.4 fL (ref 80.0–100.0)
Monocytes Absolute: 0.4 10*3/uL (ref 0.1–1.0)
Monocytes Relative: 8 %
Neutro Abs: 3.7 10*3/uL (ref 1.7–7.7)
Neutrophils Relative %: 78 %
Platelets: 136 10*3/uL — ABNORMAL LOW (ref 150–400)
RBC: 3.94 MIL/uL — ABNORMAL LOW (ref 4.22–5.81)
RDW: 15.9 % — ABNORMAL HIGH (ref 11.5–15.5)
WBC: 4.8 10*3/uL (ref 4.0–10.5)
nRBC: 0 % (ref 0.0–0.2)

## 2022-11-27 LAB — APTT
aPTT: 74 seconds — ABNORMAL HIGH (ref 24–36)
aPTT: 55 seconds — ABNORMAL HIGH (ref 24–36)

## 2022-11-27 LAB — HEPARIN LEVEL (UNFRACTIONATED)
Heparin Unfractionated: 1 IU/mL — ABNORMAL HIGH (ref 0.30–0.70)
Heparin Unfractionated: 0.82 IU/mL — ABNORMAL HIGH (ref 0.30–0.70)

## 2022-11-27 MED ORDER — HEPARIN (PORCINE) 25000 UT/250ML-% IV SOLN
1350.0000 [IU]/h | INTRAVENOUS | Status: DC
  Filled 2022-11-27: qty 250

## 2022-11-27 MED ORDER — METHOCARBAMOL 500 MG PO TABS
500.0000 mg | ORAL_TABLET | Freq: Three times a day (TID) | ORAL | Status: DC
  Administered 2022-11-27 – 2022-11-28 (×3): 500 mg via ORAL
  Filled 2022-11-27 (×4): qty 1

## 2022-11-27 MED ORDER — DM-GUAIFENESIN ER 30-600 MG PO TB12
1.0000 | ORAL_TABLET | Freq: Two times a day (BID) | ORAL | Status: DC
  Administered 2022-11-27 – 2022-11-28 (×3): 1 via ORAL
  Filled 2022-11-27 (×3): qty 1

## 2022-11-27 MED ORDER — MORPHINE SULFATE (PF) 2 MG/ML IV SOLN: 2.0000 mg | INTRAVENOUS | Status: DC | PRN

## 2022-11-27 MED ORDER — ACETAMINOPHEN 500 MG PO TABS
1000.0000 mg | ORAL_TABLET | Freq: Four times a day (QID) | ORAL | Status: DC
  Administered 2022-11-27 – 2022-11-28 (×5): 1000 mg via ORAL
  Filled 2022-11-27 (×5): qty 2

## 2022-11-27 NOTE — Discharge Instructions (Signed)
[facility]  Please arrive at least 30 min before your appointment to complete your check in paperwork.  If you are unable to arrive 30 min prior to your appointment time we may have to cancel or reschedule you. LAPAROSCOPIC SURGERY: POST OP INSTRUCTIONS Always review your discharge instruction sheet given to you by the facility where your surgery was performed. IF YOU HAVE DISABILITY OR FAMILY LEAVE FORMS, YOU MUST BRING THEM TO THE OFFICE FOR PROCESSING.   DO NOT GIVE THEM TO YOUR DOCTOR.  PAIN CONTROL  First take acetaminophen (Tylenol) AND/or ibuprofen (Advil) to control your pain after surgery.  Follow directions on package.  Taking acetaminophen (Tylenol) and/or ibuprofen (Advil) regularly after surgery will help to control your pain and lower the amount of prescription pain medication you may need.  You should not take more than 4,000 mg (4 grams) of acetaminophen (Tylenol) in 24 hours.  You should not take ibuprofen (Advil), aleve, motrin, naprosyn or other NSAIDS if you have a history of stomach ulcers or chronic kidney disease.  A prescription for pain medication may be given to you upon discharge.  Take your pain medication as prescribed, if you still have uncontrolled pain after taking acetaminophen (Tylenol) or ibuprofen (Advil). Use ice packs to help control pain. If you need a refill on your pain medication, please contact your pharmacy.  They will contact our office to request authorization. Prescriptions will not be filled after 5pm or on week-ends.  HOME MEDICATIONS Take your usually prescribed medications unless otherwise directed.  DIET You should follow a light diet the first few days after arrival home.  Be sure to include lots of fluids daily. Avoid fatty, fried foods.   CONSTIPATION It is common to experience some constipation after surgery and if you are taking pain medication.  Increasing fluid intake and taking a stool softener (such as Colace)  will usually help or prevent this problem from occurring.  A mild laxative (Milk of Magnesia or Miralax) should be taken according to package instructions if there are no bowel movements after 48 hours.  WOUND/INCISION CARE Most patients will experience some swelling and bruising in the area of the incisions.  Ice packs will help.  Swelling and bruising can take several days to resolve.  Unless discharge instructions indicate otherwise, follow guidelines below  STERI-STRIPS - you may remove your outer bandages 48 hours after surgery, and you may shower at that time.  You have steri-strips (small skin tapes) in place directly over the incision.  These strips should be left on the skin for 7-10 days.   DERMABOND/SKIN GLUE - you may shower in 24 hours.  The glue will flake off over the next 2-3 weeks. Any sutures or staples will be removed at the office during your follow-up visit.  ACTIVITIES You may resume regular (light) daily activities beginning the next day--such as daily self-care, walking, climbing stairs--gradually increasing activities as tolerated.  You may have sexual intercourse when it is comfortable.  Refrain from any heavy lifting or straining until approved by your doctor. You may drive when you are no longer taking prescription pain medication, you can comfortably wear a seatbelt, and you can safely maneuver your car and apply brakes.  FOLLOW-UP You should see your doctor in the office for a follow-up appointment approximately 2-3 weeks after your surgery.  You should have been given your post-op/follow-up appointment when your surgery was scheduled.  If you did not receive a post-op/follow-up appointment, make sure   that you call for this appointment within a day or two after you arrive home to insure a convenient appointment time.   WHEN TO CALL YOUR DOCTOR: Fever over 101.0 Inability to urinate Continued bleeding from incision. Increased pain, redness, or drainage from the  incision. Increasing abdominal pain  The clinic staff is available to answer your questions during regular business hours.  Please don't hesitate to call and ask to speak to one of the nurses for clinical concerns.  If you have a medical emergency, go to the nearest emergency room or call 911.  A surgeon from [facility] is always on call at the hospital. [address], [address], [place], [place]  [address] ? [address] [address], [place], [place]   [address] [phone] ? [phone] ? FAX [phone]  

## 2022-11-27 NOTE — Progress Notes (Signed)
ANTICOAGULATION CONSULT NOTE Pharmacy Consult for heparin  Indication: bridge therapy while apixaban for PE on hold  Allergies  Allergen Reactions   Invokana [Canagliflozin] Anaphylaxis    Facial/neck/ lip swelling   Ace Inhibitors Cough   Patient Measurements: Weight: 89 kg (196 lb 3.4 oz) Heparin Dosing Weight: 89 kg  Vital Signs: Temp: 98.6 F (37 C) (04/29 1332) Temp Source: Oral (04/29 1332) BP: 156/66 (04/29 1332) Pulse Rate: 77 (04/29 1332)  Labs: Recent Labs    11/25/22 0451 11/25/22 1030 11/25/22 1458 11/26/22 0505 11/26/22 0754 11/27/22 0510 11/27/22 1248  HGB 11.1*  --   --  10.2*  --  11.4*  --   HCT 34.7*  --   --  34.6*  --  35.6*  --   PLT 144*  --   --  124*  --  136*  --   APTT  --  <20* 168*  --   --  74* 55*  HEPARINUNFRC  --  >1.10*  --   --   --  1.00* 0.82*  CREATININE 1.14  --   --   --  1.13 1.13  --      Estimated Creatinine Clearance: 59.1 mL/min (by C-G formula based on SCr of 1.13 mg/dL).  Assessment: 77 yo M on apixaban PTA for PE (08/23/2022) .  Pharmacy consulted to dose heparin for bridge therapy while apixaban on hold for possible early acute appendicitis. PTA on apixaban 5 mg po BID, last dose was early 4/26  Now s/p appendectomy. Surgery would like heparin to be restarted at 2000 tonight. Heparin level was still falsely elevated on 4/27 due to recent apixaban use so using aPTTs.  11/27/2022: confirmatory aPTT 55 sec; subtherapeutic on IV heparin 1200 units/hr Heparin level 0.82- remains elevated due to lab interference with recent apixaban use CBC- Hg/pltc low/stable No bleeding or infusion related concerns per RN  Goal of Therapy:  Heparin level 0.3-0.7 units/ml aPTT 66-102 seconds Monitor platelets by anticoagulation protocol: Yes   Plan:  increase IV heparin to 1350 units/hr  Check  aPTT 8 hours after rate increase Monitor CBC, aPTT, HL  s/s of bleed F/u for transition back to PTA apixaban  Herby Abraham,  Pharm.D Use secure chat for questions 11/27/2022 2:40 PM

## 2022-11-27 NOTE — Progress Notes (Signed)
PROGRESS NOTE  James Rim. ZOX:[id number] DOB: [birth year] DOA: 11/24/2022 PCP: Wilmer Floor, NP   HPI/Recap of past 24 hours: James Rim. is a 77 y.o. male with medical history significant for insulin-dependent type 2 diabetes, multiple myeloma in remission, on Eliquis for recently diagnosed PE who has had multiple hospital admissions due to chronic diarrhea of unclear etiology resulting in electrolyte abnormalities. He was recently in the hospitalist service from 4/13 to 4/16 for the same reason. Presents to the ER with continued baseline diarrhea, but complaints of progressing RLQ abdominal pain. In the ED, vital signs stable, and labs with mild AKI. CT scan of the abdomen pelvis done showed possible appendiceal thickening. General surgery consulted. Pt admitted for further management.    Today, patient reports mild postop abdominal pain, denies any nausea/vomiting, fever/chills.  Reports some cough/congestion.    Assessment/Plan: Principal Problem:   Appendicitis Active Problems:   COPD (chronic obstructive pulmonary disease) (HCC)   Type 2 diabetes mellitus with complication, with long-term current use of insulin (HCC)   Chronic diarrhea   BPH (benign prostatic hyperplasia)   Essential hypertension   AKI (acute kidney injury) (HCC)   Acute appendicitis s/p laparoscopic appendectomy on 4/28 Currently afebrile, with no leukocytosis CT abdomen/pelvis showed possible appendiceal thickening General surgery consulted, s/p surgery on 4/28 Monitor closely  COPD Noted some bilateral mild expiratory wheezes, with some chronic cough, likely his baseline Chest x-ray showed chronic lung disease with mild bibasilar atelectasis, otherwise unremarkable DuoNeb, continue inhalers, Mucinex Monitor closely  Hypokalemia Replace as needed  History of PE Eliquis currently on hold, switched to IV heparin drip Restart heparin drip as per surgery  Chronic  diarrhea Continue as needed Imodium, continue colestipol  Type 2 diabetes mellitus Last A1c on 08/23/2022 was 6 Continue SSI, Accu-Cheks, hypoglycemic protocol  Hypertension BP stable Continue amlodipine  BPH Continue finasteride, Flomax      Estimated body mass index is 26.61 kg/m as calculated from the following:   Height as of 11/20/22: 6' (1.829 m).   Weight as of this encounter: 89 kg.     Code Status: Full  Family Communication: None at bedside  Disposition Plan: Status is: Observation The patient will require care spanning > 2 midnights and should be moved to inpatient because: Level of care      Consultants: General surgery  Procedures: Laparoscopic appendectomy  Antimicrobials: None  DVT prophylaxis: Heparin drip   Objective: Vitals:   11/27/22 0530 11/27/22 0743 11/27/22 1011 11/27/22 1332  BP: 125/64  130/81 (!) 156/66  Pulse: 73  80 77  Resp: 18  16 16   Temp: 98.4 F (36.9 C)  98.4 F (36.9 C) 98.6 F (37 C)  TempSrc: Oral  Oral Oral  SpO2: 98% 97% 100% 100%  Weight:        Intake/Output Summary (Last 24 hours) at 11/27/2022 1402 Last data filed at 11/27/2022 1000 Gross per 24 hour  Intake 711.9 ml  Output 2900 ml  Net -2188.1 ml   Filed Weights   11/24/22 1147  Weight: 89 kg    Exam: General: NAD  Cardiovascular: S1, S2 present Respiratory: Diminished breath sounds bilaterally Abdomen: Soft, tender, distended, bowel sounds present Musculoskeletal: No bilateral pedal edema noted, noted extensive venous stasis changes on BLE Skin: As above Psychiatry: Normal mood     Data Reviewed: CBC: Recent Labs  Lab 11/24/22 1248 11/25/22 0451 11/26/22 0505 11/27/22 0510  WBC 4.0 3.7* 3.0* 4.8  NEUTROABS 2.6  --  1.4* 3.7  HGB 12.4* 11.1* 10.2* 11.4*  HCT 37.8* 34.7* 34.6* 35.6*  MCV 89.6 90.8 98.9 90.4  PLT 168 144* 124* 136*   Basic Metabolic Panel: Recent Labs  Lab 11/24/22 1248 11/25/22 0451 11/25/22 1458  11/26/22 0754 11/27/22 0510  NA 135 138  --  138 137  K 4.2 3.1*  --  4.1 4.3  CL 102 105  --  107 105  CO2 22 22  --  20* 23  GLUCOSE 159* 79  --  92 160*  BUN 12 9  --  6* 9  CREATININE 1.29* 1.14  --  1.13 1.13  CALCIUM 8.8* 8.3*  --  8.4* 8.8*  MG 2.0  --  2.0  --   --    GFR: Estimated Creatinine Clearance: 59.1 mL/min (by C-G formula based on SCr of 1.13 mg/dL). Liver Function Tests: Recent Labs  Lab 11/24/22 1248  AST 22  ALT 13  ALKPHOS 84  BILITOT 1.3*  PROT 7.6  ALBUMIN 4.1   Recent Labs  Lab 11/25/22 0451  LIPASE 29   No results for input(s): "AMMONIA" in the last 168 hours. Coagulation Profile: No results for input(s): "INR", "PROTIME" in the last 168 hours. Cardiac Enzymes: No results for input(s): "CKTOTAL", "CKMB", "CKMBINDEX", "TROPONINI" in the last 168 hours. BNP (last 3 results) No results for input(s): "PROBNP" in the last 8760 hours. HbA1C: No results for input(s): "HGBA1C" in the last 72 hours. CBG: Recent Labs  Lab 11/26/22 0758 11/26/22 1550 11/26/22 2139 11/27/22 0735 11/27/22 1120  GLUCAP 94 177* 259* 137* 118*   Lipid Profile: No results for input(s): "CHOL", "HDL", "LDLCALC", "TRIG", "CHOLHDL", "LDLDIRECT" in the last 72 hours. Thyroid Function Tests: No results for input(s): "TSH", "T4TOTAL", "FREET4", "T3FREE", "THYROIDAB" in the last 72 hours. Anemia Panel: No results for input(s): "VITAMINB12", "FOLATE", "FERRITIN", "TIBC", "IRON", "RETICCTPCT" in the last 72 hours. Urine analysis:    Component Value Date/Time   COLORURINE STRAW (A) 08/23/2022 1218   APPEARANCEUR CLEAR 08/23/2022 1218   LABSPEC 1.020 08/23/2022 1218   PHURINE 6.0 08/23/2022 1218   GLUCOSEU >=500 (A) 08/23/2022 1218   HGBUR MODERATE (A) 08/23/2022 1218   BILIRUBINUR NEGATIVE 08/23/2022 1218   KETONESUR 5 (A) 08/23/2022 1218   PROTEINUR 100 (A) 08/23/2022 1218   UROBILINOGEN 1.0 10/25/2011 1058   NITRITE NEGATIVE 08/23/2022 1218   LEUKOCYTESUR  NEGATIVE 08/23/2022 1218   Sepsis Labs: @LABRCNTIP (procalcitonin:4,lacticidven:4)  )No results found for this or any previous visit (from the past 240 hour(s)).    Studies: No results found.  Scheduled Meds:  acetaminophen  1,000 mg Oral Q6H   amLODipine  10 mg Oral q morning   colestipol  2 g Oral BID   finasteride  5 mg Oral Daily   fluticasone furoate-vilanterol  1 puff Inhalation Daily   And   umeclidinium bromide  1 puff Inhalation Daily   insulin aspart  0-15 Units Subcutaneous TID WC   insulin aspart  0-5 Units Subcutaneous QHS   methocarbamol  500 mg Oral TID   morphine  30 mg Oral Q12H   pantoprazole  40 mg Oral BID   tamsulosin  0.4 mg Oral Daily    Continuous Infusions:  heparin 1,200 Units/hr (11/27/22 1152)     LOS: 0 days     Briant Cedar, MD [facility]  If 7PM-7AM, please contact night-coverage [email or web address] 11/27/2022, 2:02 PM

## 2022-11-27 NOTE — Progress Notes (Signed)
ANTICOAGULATION CONSULT NOTE Pharmacy Consult for heparin  Indication: bridge therapy while apixaban for PE on hold  Allergies  Allergen Reactions   Invokana [Canagliflozin] Anaphylaxis    Facial/neck/ lip swelling   Ace Inhibitors Cough   Patient Measurements: Weight: 89 kg (196 lb 3.4 oz) Heparin Dosing Weight: 89 kg  Vital Signs: Temp: 98.4 F (36.9 C) (04/29 0530) Temp Source: Oral (04/29 0530) BP: 125/James (04/29 0530) Pulse Rate: 73 (04/29 0530)  Labs: Recent Labs    11/24/22 1248 11/24/22 1810 11/24/22 2204 11/25/22 0451 11/25/22 1030 11/25/22 1458 11/26/22 0505 11/26/22 0754 11/27/22 0510  HGB 12.4*  --   --  11.1*  --   --  10.2*  --   --   HCT 37.8*  --   --  34.7*  --   --  34.6*  --   --   PLT 168  --   --  144*  --   --  124*  --   --   APTT  --    < >  --   --  <20* 168*  --   --  74*  HEPARINUNFRC  --   --  >1.10*  --  >1.10*  --   --   --  1.00*  CREATININE 1.29*  --   --  1.14  --   --   --  1.13  --    < > = values in this interval not displayed.     Estimated Creatinine Clearance: 59.1 mL/min (by C-G formula based on SCr of 1.13 mg/dL).   Medical History: Past Medical History:  Diagnosis Date   Anemia    Arthritis    BPH (benign prostatic hyperplasia)    CHF (congestive heart failure) (HCC)    Chronic pain    Diabetes mellitus    Dyspnea    GERD (gastroesophageal reflux disease)    Headache(784.0)    migraines, sinus headaches   Hepatitis    C and B-treated   HLD (hyperlipidemia)    Hypertension    Leukemia (HCC)    Lupus (HCC)    Multiple myeloma (HCC)    Pneumonia 07/2011   Sleep apnea 03/2018   going for fitting on 04-29-18   Thyroid disease    goiter   Urinary frequency    Assessment: 77 yo [name] on apixaban PTA for PE (08/23/2022) .  Pharmacy consulted to dose heparin for bridge therapy while apixaban on hold for possible early acute appendicitis. PTA on apixaban 5 mg po BID, last dose was early 4/26  Now s/p appendectomy.  Surgery would like heparin to be restarted at 2000 tonight. Heparin level was still falsely elevated on 4/27 due to recent apixaban use so using aPTTs.  11/27/2022: Initial aPTT 74 sec; therapeutic on IV heparin 1200 units/hr Heparin level 1.00- remains elevated due to lab interference with recent apixaban use CBC- Hg/pltc low/stable No bleeding or infusion related concerns per RN  Goal of Therapy:  Heparin level 0.3-0.7 units/ml aPTT 66-102 seconds Monitor platelets by anticoagulation protocol: Yes   Plan:  Continue IV heparin at 1,200 units/hr  Check confirmatory aPTT at 1300 today Monitor CBC, s/s of bleed  Patient has been difficult stick this admit causing issues with lab monitoring. If issues continue might want to consider a low dose enoxaparin until ready to restart apixaban if ok with surgery and primary  Junita Push, PharmD, BCPS Clinical Pharmacist 11/27/2022 6:01 AM

## 2022-11-27 NOTE — Progress Notes (Signed)
1 Day Post-Op  Subjective: Having some pain as expected.  Tolerating a carb mod diet.    ROS: See above, otherwise other systems negative  Objective: Vital signs in last 24 hours: Temp:  [98.2 F (36.8 C)-98.7 F (37.1 C)] 98.4 F (36.9 C) (04/29 0530) Pulse Rate:  [73-84] 73 (04/29 0530) Resp:  [16-22] 18 (04/29 0530) BP: (125-164)/(64-85) 125/64 (04/29 0530) SpO2:  [97 %-100 %] 97 % (04/29 0743) Last BM Date : 11/24/22  Intake/Output from previous day: 04/28 0701 - 04/29 0700 In: 1509 [P.O.:330; I.V.:922.5; IV Piggyback:256.5] Out: 2760 [Urine:2750; Blood:10] Intake/Output this shift: No intake/output data recorded.  PE: Abd: soft, appropriately tender, +Bs , ND, incisions c/d/i  Lab Results:  Recent Labs    11/26/22 0505 11/27/22 0510  WBC 3.0* 4.8  HGB 10.2* 11.4*  HCT 34.6* 35.6*  PLT 124* 136*   BMET Recent Labs    11/26/22 0754 11/27/22 0510  NA 138 137  K 4.1 4.3  CL 107 105  CO2 20* 23  GLUCOSE 92 160*  BUN 6* 9  CREATININE 1.13 1.13  CALCIUM 8.4* 8.8*   PT/INR No results for input(s): "LABPROT", "INR" in the last 72 hours. CMP     Component Value Date/Time   NA 137 11/27/2022 0510   NA 138 06/26/2017 0946   K 4.3 11/27/2022 0510   K 3.8 06/26/2017 0946   CL 105 11/27/2022 0510   CL 105 08/09/2012 0918   CO2 23 11/27/2022 0510   CO2 25 06/26/2017 0946   GLUCOSE 160 (H) 11/27/2022 0510   GLUCOSE 279 (H) 06/26/2017 0946   GLUCOSE 178 (H) 08/09/2012 0918   BUN 9 11/27/2022 0510   BUN 16.4 06/26/2017 0946   CREATININE 1.13 11/27/2022 0510   CREATININE 1.69 (H) 11/03/2022 1348   CREATININE 1.2 06/26/2017 0946   CALCIUM 8.8 (L) 11/27/2022 0510   CALCIUM 9.3 06/26/2017 0946   PROT 7.6 11/24/2022 1248   PROT 7.5 06/26/2017 0946   PROT 8.1 06/26/2017 0946   ALBUMIN 4.1 11/24/2022 1248   ALBUMIN 3.6 06/26/2017 0946   AST 22 11/24/2022 1248   AST 14 (L) 11/03/2022 1348   AST 11 06/26/2017 0946   ALT 13 11/24/2022 1248   ALT 9  11/03/2022 1348   ALT 14 06/26/2017 0946   ALKPHOS 84 11/24/2022 1248   ALKPHOS 75 06/26/2017 0946   BILITOT 1.3 (H) 11/24/2022 1248   BILITOT 0.5 11/03/2022 1348   BILITOT 0.31 06/26/2017 0946   GFRNONAA >60 11/27/2022 0510   GFRNONAA 41 (L) 11/03/2022 1348   GFRAA >60 02/26/2020 1119   Lipase     Component Value Date/Time   LIPASE 29 11/25/2022 0451       Studies/Results: No results found.  Anti-infectives: Anti-infectives (From admission, onward)    Start     Dose/Rate Route Frequency Ordered Stop   11/26/22 1106  piperacillin-tazobactam (ZOSYN) 3.375 GM/50ML IVPB       Note to Pharmacy: Mortimer Fries A: cabinet override      11/26/22 1106 11/26/22 1138   11/25/22 0000  piperacillin-tazobactam (ZOSYN) IVPB 3.375 g        3.375 g 12.5 mL/hr over 240 Minutes Intravenous Every 8 hours 11/24/22 2139     11/24/22 1715  piperacillin-tazobactam (ZOSYN) IVPB 3.375 g  Status:  Discontinued        3.375 g 100 mL/hr over 30 Minutes Intravenous Every 8 hours 11/24/22 1705 11/24/22 2138   11/24/22 1630  piperacillin-tazobactam (ZOSYN)  IVPB 3.375 g        3.375 g 100 mL/hr over 30 Minutes Intravenous  Once 11/24/22 1617 11/24/22 1722        Assessment/Plan POD 1, s/p lap appy, Dr. Dwain Sarna 11/26/22 -doing well today -on home meds, scheduled tylenol, scheduled low dose robaxin, prn oxy, and change morphine to breakthrough -ambulate in halls -cont to carb mod diet -may resume eliquis later tonight or tomorrow morning.   -likely can go home tomorrow from surgical standpoint.   FEN - carb mod/IVFs VTE - heparin gtt ID - [email or web address], completed  BPH CHF DM GERD HLD Leukemia/MM OSA    LOS: 0 days    Letha Cape , [facility] Surgery 11/27/2022, 9:53 AM Please see Amion for pager number during day hours 7:00am-4:30pm or 7:00am -11:30am on weekends

## 2022-11-27 NOTE — Evaluation (Signed)
Physical Therapy Evaluation Patient Details Name: James [name]. MRN: [id number] DOB: 17-Apr-1946 Today's Date: 11/27/2022  History of Present Illness  77 y.o. male with medical history significant for insulin-dependent type 2 diabetes, multiple myeloma in remission, on Eliquis for recently diagnosed PE who has had multiple hospital admissions due to chronic diarrhea of unclear etiology resulting in electrolyte abnormalities. He was recently in the hospitalist service from 4/13 to 4/16 for the same reason. Presents to the ER with continued baseline diarrhea, but complaints of progressing RLQ abdominal pain.Dx of appendicitis, s/p appendectomy 11/26/22. Pt had recent R olecranon fx 10/18/22 2* a fall, has a posterior long arm splint in room, pt not wearing it at time of this evaluation, he was not able to state parameters of when he is to wear it.  Clinical Impression  Pt admitted with above diagnosis. Pt ambulated 150' with a straight cane, no loss of balance, slow cadence. Noted pt has a splint for R elbow in the room 2* recent olecranon fx 10/18/22, he was unable to state parameters of when it is to be worn. Good progress expected.  Pt currently with functional limitations due to the deficits listed below (see PT Problem List). Pt will benefit from acute skilled PT to increase their independence and safety with mobility to allow discharge.          Recommendations for follow up therapy are one component of a multi-disciplinary discharge planning process, led by the attending physician.  Recommendations may be updated based on patient status, additional functional criteria and insurance authorization.  Follow Up Recommendations       Assistance Recommended at Discharge Intermittent Supervision/Assistance  Patient can return home with the following  A little help with bathing/dressing/bathroom;A little help with walking and/or transfers;Help with stairs or ramp for entrance    Equipment  Recommendations None recommended by PT  Recommendations for Other Services       Functional Status Assessment Patient has had a recent decline in their functional status and demonstrates the ability to make significant improvements in function in a reasonable and predictable amount of time.     Precautions / Restrictions Precautions Precautions: Fall Precaution Comments: fall 10/18/22 Required Braces or Orthoses: Splint/Cast Splint/Cast: R posterior long arm  splint in room (pt not wearing it upon my entry), per chart pt had R olecranon fx 10/18/22, pt not able to state parameters of when he is to wear it. Pt donned splint for walking in hallway. Restrictions Weight Bearing Restrictions: No RUE Weight Bearing: Weight bearing as tolerated      Mobility  Bed Mobility Overal bed mobility: Modified Independent, Needs Assistance Bed Mobility: Supine to Sit     Supine to sit: Supervision     General bed mobility comments: HOB up, attempted to instruct pt in log roll technique but he was resistant to instructions    Transfers Overall transfer level: Needs assistance Equipment used: Straight cane Transfers: Sit to/from Stand Sit to Stand: Supervision                Ambulation/Gait Ambulation/Gait assistance: Supervision Gait Distance (Feet): 150 Feet Assistive device: Straight cane Gait Pattern/deviations: Decreased stride length Gait velocity: decr     General Gait Details: steady with cane, no loss of balance, decreased cadence  Stairs            Wheelchair Mobility    Modified Rankin (Stroke Patients Only)       Balance Overall balance assessment: History of Falls,  Needs assistance   Sitting balance-Leahy Scale: Good       Standing balance-Leahy Scale: Fair                               Pertinent Vitals/Pain Pain Assessment Pain Assessment: Faces Faces Pain Scale: Hurts even more Pain Location: abdomen Pain Descriptors /  Indicators: Grimacing Pain Intervention(s): Limited activity within patient's tolerance, Monitored during session, Premedicated before session    Home Living Family/patient expects to be discharged to:: Private residence Living Arrangements: Spouse/significant other Available Help at Discharge: Family;Available 24 hours/day Type of Home: House Home Access: Stairs to enter Entrance Stairs-Rails: None Entrance Stairs-Number of Steps: 3   Home Layout: One level Home Equipment: Agricultural consultant (2 wheels);Rollator (4 wheels);BSC/3in1;Cane - single point;Hospital bed      Prior Function Prior Level of Function : Independent/Modified Independent;Driving             Mobility Comments: walks with SPC when leaving home       Hand Dominance        Extremity/Trunk Assessment   Upper Extremity Assessment Upper Extremity Assessment: Defer to OT evaluation;RUE deficits/detail RUE Deficits / Details: splint for R elbow 2* olecranon fx 10/18/22    Lower Extremity Assessment Lower Extremity Assessment: RLE deficits/detail;LLE deficits/detail RLE Sensation: history of peripheral neuropathy;decreased light touch LLE Sensation: history of peripheral neuropathy;decreased light touch    Cervical / Trunk Assessment Cervical / Trunk Assessment: Normal  Communication   Communication: No difficulties  Cognition Arousal/Alertness: Awake/alert Behavior During Therapy: WFL for tasks assessed/performed Overall Cognitive Status: Difficult to assess                                 General Comments: pt does not give direct answers to questions        General Comments      Exercises     Assessment/Plan    PT Assessment Patient needs continued PT services  PT Problem List Decreased activity tolerance;Decreased mobility;Pain       PT Treatment Interventions Therapeutic activities;Gait training;Therapeutic exercise    PT Goals (Current goals can be found in the Care Plan  section)  Acute Rehab PT Goals Patient Stated Goal: decrease pain PT Goal Formulation: With patient Time For Goal Achievement: 12/11/22 Potential to Achieve Goals: Good    Frequency Min 1X/week     Co-evaluation               AM-PAC PT "6 Clicks" Mobility  Outcome Measure Help needed turning from your back to your side while in a flat bed without using bedrails?: A Little Help needed moving from lying on your back to sitting on the side of a flat bed without using bedrails?: A Little Help needed moving to and from a bed to a chair (including a wheelchair)?: A Little Help needed standing up from a chair using your arms (e.g., wheelchair or bedside chair)?: A Little Help needed to walk in hospital room?: A Little Help needed climbing 3-5 steps with a railing? : A Lot 6 Click Score: 17    End of Session Equipment Utilized During Treatment: Gait belt Activity Tolerance: Patient tolerated treatment well Patient left: in chair;with call bell/phone within reach;with family/visitor present Nurse Communication: Mobility status PT Visit Diagnosis: Difficulty in walking, not elsewhere classified (R26.2);Pain    Time: 2956-2130 PT Time Calculation (min) (ACUTE ONLY):  28 min   Charges:   PT Evaluation $PT Eval Moderate Complexity: 1 Mod PT Treatments $Gait Training: 8-22 mins        Ralene Bathe Kistler PT 11/27/2022  Acute Rehabilitation Services  Office [phone]

## 2022-11-28 DIAGNOSIS — K353 Acute appendicitis with localized peritonitis, without perforation or gangrene: Secondary | ICD-10-CM | POA: Diagnosis not present

## 2022-11-28 LAB — CBC WITH DIFFERENTIAL/PLATELET
Abs Immature Granulocytes: 0.01 10*3/uL (ref 0.00–0.07)
Basophils Absolute: 0 10*3/uL (ref 0.0–0.1)
Basophils Relative: 1 %
Eosinophils Absolute: 0 10*3/uL (ref 0.0–0.5)
Eosinophils Relative: 1 %
HCT: 31.7 % — ABNORMAL LOW (ref 39.0–52.0)
Hemoglobin: 10.3 g/dL — ABNORMAL LOW (ref 13.0–17.0)
Immature Granulocytes: 0 %
Lymphocytes Relative: 21 %
Lymphs Abs: 0.9 10*3/uL (ref 0.7–4.0)
MCH: 29.5 pg (ref 26.0–34.0)
MCHC: 32.5 g/dL (ref 30.0–36.0)
MCV: 90.8 fL (ref 80.0–100.0)
Monocytes Absolute: 0.6 10*3/uL (ref 0.1–1.0)
Monocytes Relative: 13 %
Neutro Abs: 2.7 10*3/uL (ref 1.7–7.7)
Neutrophils Relative %: 64 %
Platelets: 126 10*3/uL — ABNORMAL LOW (ref 150–400)
RBC: 3.49 MIL/uL — ABNORMAL LOW (ref 4.22–5.81)
RDW: 16.5 % — ABNORMAL HIGH (ref 11.5–15.5)
WBC: 4.2 10*3/uL (ref 4.0–10.5)
nRBC: 0 % (ref 0.0–0.2)

## 2022-11-28 LAB — GLUCOSE, CAPILLARY
Glucose-Capillary: 102 mg/dL — ABNORMAL HIGH (ref 70–99)
Glucose-Capillary: 140 mg/dL — ABNORMAL HIGH (ref 70–99)

## 2022-11-28 LAB — SURGICAL PATHOLOGY

## 2022-11-28 LAB — BASIC METABOLIC PANEL
Anion gap: 6 (ref 5–15)
BUN: 14 mg/dL (ref 8–23)
CO2: 23 mmol/L (ref 22–32)
Calcium: 8.2 mg/dL — ABNORMAL LOW (ref 8.9–10.3)
Chloride: 107 mmol/L (ref 98–111)
Creatinine, Ser: 1.22 mg/dL (ref 0.61–1.24)
GFR, Estimated: >60 mL/min (ref >60)
Glucose, Bld: 137 mg/dL — ABNORMAL HIGH (ref 70–99)
Potassium: 3.6 mmol/L (ref 3.5–5.1)
Sodium: 136 mmol/L (ref 135–145)

## 2022-11-28 LAB — APTT: aPTT: 89 seconds — ABNORMAL HIGH (ref 24–36)

## 2022-11-28 MED ORDER — IPRATROPIUM-ALBUTEROL 0.5-2.5 (3) MG/3ML IN SOLN: 3.0000 mL | Freq: Two times a day (BID) | RESPIRATORY_TRACT | Status: DC

## 2022-11-28 MED ORDER — DM-GUAIFENESIN ER 30-600 MG PO TB12: 1.0000 | ORAL_TABLET | Freq: Two times a day (BID) | ORAL | 0 refills | Status: AC

## 2022-11-28 MED ORDER — APIXABAN 5 MG PO TABS
5.0000 mg | ORAL_TABLET | Freq: Two times a day (BID) | ORAL | Status: DC
  Administered 2022-11-28: 5 mg via ORAL
  Filled 2022-11-28: qty 1

## 2022-11-28 MED ORDER — ACETAMINOPHEN 325 MG PO TABS: 1000.0000 mg | ORAL_TABLET | Freq: Four times a day (QID) | ORAL | Status: DC | PRN

## 2022-11-28 NOTE — TOC Transition Note (Signed)
Transition of Care [facility]) - CM/SW Discharge Note  Patient Details  Name: James [name]. MRN: [id number] Date of Birth: [birth year]  [facility]) CM/SW Contact:  Ewing Schlein, LCSW Phone Number: 11/28/2022, 12:14 PM  Clinical Narrative: Patient is active with [facility] for [facility]. PT and OT's evaluation recommended HH. HH orders have been placed. CSW updated Cindie with [facility] regarding HH orders. TOC signing off.   Final next level of care: Home w Home Health Services Barriers to Discharge: Barriers Resolved  Patient Goals and CMS Choice CMS Medicare.gov Compare Post Acute Care list provided to:: Patient Choice offered to / list presented to : Patient  Discharge Plan and Services Additional resources added to the After Visit Summary for          DME Arranged: N/A DME Agency: NA HH Arranged: PT, OT HH Agency: [facility] Date [facility] Agency Contacted: 11/28/22 Representative spoke with at [facility] Agency: Cindie  Social Determinants of Health (SDOH) Interventions SDOH Screenings   Food Insecurity: No Food Insecurity (11/24/2022)  Housing: Low Risk  (11/24/2022)  Transportation Needs: No Transportation Needs (11/24/2022)  Utilities: Not At Risk (11/24/2022)  Tobacco Use: Medium Risk (11/27/2022)   Readmission Risk Interventions     No data to display

## 2022-11-28 NOTE — Progress Notes (Signed)
ANTICOAGULATION CONSULT NOTE Pharmacy Consult for heparin  Indication: bridge therapy while apixaban for PE on hold  Allergies  Allergen Reactions   Invokana [Canagliflozin] Anaphylaxis    Facial/neck/ lip swelling   Ace Inhibitors Cough   Patient Measurements: Weight: 89 kg (196 lb 3.4 oz) Heparin Dosing Weight: 89 kg  Vital Signs: Temp: 98.8 F (37.1 C) (04/29 2011) Temp Source: Oral (04/29 2011) BP: 114/59 (04/29 2011) Pulse Rate: 71 (04/29 2011)  Labs: Recent Labs    11/25/22 1030 11/25/22 1458 11/26/22 0505 11/26/22 0754 11/27/22 0510 11/27/22 1248 11/28/22 0045  HGB  --   --  10.2*  --  11.4*  --  10.3*  HCT  --   --  34.6*  --  35.6*  --  31.7*  PLT  --   --  124*  --  136*  --  126*  APTT <20*   < >  --   --  74* 55* 89*  HEPARINUNFRC >1.10*  --   --   --  1.00* 0.82*  --   CREATININE  --   --   --  1.13 1.13  --  1.22   < > = values in this interval not displayed.     Estimated Creatinine Clearance: 54.8 mL/min (by C-G formula based on SCr of 1.22 mg/dL).  Assessment: 77 yo M on apixaban PTA for PE (08/23/2022) .  Pharmacy consulted to dose heparin for bridge therapy while apixaban on hold for possible early acute appendicitis. PTA on apixaban 5 mg po BID, last dose was early 4/26  Now s/p appendectomy. Surgery would like heparin to be restarted at 2000 tonight. Heparin level was still falsely elevated on 4/27 due to recent apixaban use so using aPTTs.  11/28/2022: aPTT 89 therapeutic on 1350 units/hr Hgb/plts low but stable No bleeding noted  Goal of Therapy:  Heparin level 0.3-0.7 units/ml aPTT 66-102 seconds Monitor platelets by anticoagulation protocol: Yes   Plan:  Continue heparin drip at 1350 units/hr  Confirmatory aPTT in 8 hours Monitor CBC, aPTT, HL  s/s of bleed F/u for transition back to PTA apixaban  Arley Phenix RPh 11/28/2022, 1:33 AM

## 2022-11-28 NOTE — Progress Notes (Signed)
2 Days Post-Op  Subjective: Tolerating a carb mod diet.  No other complaints.  Seems like he is ready to go home.  ROS: See above, otherwise other systems negative  Objective: Vital signs in last 24 hours: Temp:  [98.2 F (36.8 C)-98.8 F (37.1 C)] 98.2 F (36.8 C) (04/30 0521) Pulse Rate:  [59-77] 59 (04/30 0521) Resp:  [16-18] 18 (04/30 0521) BP: (114-156)/(59-66) 121/64 (04/30 0521) SpO2:  [96 %-100 %] 98 % (04/30 0521) Last BM Date : 11/24/22  Intake/Output from previous day: 04/29 0701 - 04/30 0700 In: 2240.8 [P.O.:1920; I.V.:320.8] Out: 2700 [Urine:2700] Intake/Output this shift: Total I/O In: -  Out: 250 [Urine:250]  PE: Abd: soft, appropriately tender, +Bs , ND, incisions c/d/i  Lab Results:  Recent Labs    11/27/22 0510 11/28/22 0045  WBC 4.8 4.2  HGB 11.4* 10.3*  HCT 35.6* 31.7*  PLT 136* 126*   BMET Recent Labs    11/27/22 0510 11/28/22 0045  NA 137 136  K 4.3 3.6  CL 105 107  CO2 23 23  GLUCOSE 160* 137*  BUN 9 14  CREATININE 1.13 1.22  CALCIUM 8.8* 8.2*   PT/INR No results for input(s): "LABPROT", "INR" in the last 72 hours. CMP     Component Value Date/Time   NA 136 11/28/2022 0045   NA 138 06/26/2017 0946   K 3.6 11/28/2022 0045   K 3.8 06/26/2017 0946   CL 107 11/28/2022 0045   CL 105 08/09/2012 0918   CO2 23 11/28/2022 0045   CO2 25 06/26/2017 0946   GLUCOSE 137 (H) 11/28/2022 0045   GLUCOSE 279 (H) 06/26/2017 0946   GLUCOSE 178 (H) 08/09/2012 0918   BUN 14 11/28/2022 0045   BUN 16.4 06/26/2017 0946   CREATININE 1.22 11/28/2022 0045   CREATININE 1.69 (H) 11/03/2022 1348   CREATININE 1.2 06/26/2017 0946   CALCIUM 8.2 (L) 11/28/2022 0045   CALCIUM 9.3 06/26/2017 0946   PROT 7.6 11/24/2022 1248   PROT 7.5 06/26/2017 0946   PROT 8.1 06/26/2017 0946   ALBUMIN 4.1 11/24/2022 1248   ALBUMIN 3.6 06/26/2017 0946   AST 22 11/24/2022 1248   AST 14 (L) 11/03/2022 1348   AST 11 06/26/2017 0946   ALT 13 11/24/2022 1248    ALT 9 11/03/2022 1348   ALT 14 06/26/2017 0946   ALKPHOS 84 11/24/2022 1248   ALKPHOS 75 06/26/2017 0946   BILITOT 1.3 (H) 11/24/2022 1248   BILITOT 0.5 11/03/2022 1348   BILITOT 0.31 06/26/2017 0946   GFRNONAA >60 11/28/2022 0045   GFRNONAA 41 (L) 11/03/2022 1348   GFRAA >60 02/26/2020 1119   Lipase     Component Value Date/Time   LIPASE 29 11/25/2022 0451       Studies/Results: No results found.  Anti-infectives: Anti-infectives (From admission, onward)    Start     Dose/Rate Route Frequency Ordered Stop   11/26/22 1106  piperacillin-tazobactam (ZOSYN) 3.375 GM/50ML IVPB       Note to Pharmacy: James [name] A: cabinet override      11/26/22 1106 11/26/22 1138   11/25/22 0000  piperacillin-tazobactam (ZOSYN) IVPB 3.375 g  Status:  Discontinued        3.375 g 12.5 mL/hr over 240 Minutes Intravenous Every 8 hours 11/24/22 2139 11/27/22 0956   11/24/22 1715  piperacillin-tazobactam (ZOSYN) IVPB 3.375 g  Status:  Discontinued        3.375 g 100 mL/hr over 30 Minutes Intravenous Every 8 hours 11/24/22  1705 11/24/22 2138   11/24/22 1630  piperacillin-tazobactam (ZOSYN) IVPB 3.375 g        3.375 g 100 mL/hr over 30 Minutes Intravenous  Once 11/24/22 1617 11/24/22 1722        Assessment/Plan POD 2, s/p lap appy, Dr. Dwain Sarna 11/26/22 -doing well today -on home meds -ambulate in halls -cont to carb mod diet -may resume eliquis today  -ok for DC home today from surgical standpoint.  -PDMP reviewed and patient just got 120 vicodin filled, has MS contin, tramadol, and gabapentin.  No further narcotics will be provided at discharge.  FEN - carb mod/IVFs VTE - heparin gtt, may resume eliquis ID - [email or web address], completed  BPH CHF DM GERD HLD Leukemia/MM OSA    LOS: 0 days    Letha Cape , [facility] 11/28/2022, 10:35 AM Please see Amion for pager number during day hours 7:00am-4:30pm or 7:00am -11:30am on weekends

## 2022-11-28 NOTE — Discharge Summary (Signed)
Physician Discharge Summary   Patient: James [name]. MRN: [id number] DOB: 11-07-1945  Admit date:     11/24/2022  Discharge date: 11/28/22  Discharge Physician: Briant Cedar   PCP: Wilmer Floor, NP   Recommendations at discharge:   Follow-up with PCP in 1 week Follow-up with general surgery as scheduled  Discharge Diagnoses: Principal Problem:   Appendicitis Active Problems:   COPD (chronic obstructive pulmonary disease) (HCC)   Type 2 diabetes mellitus with complication, with long-term current use of insulin (HCC)   Chronic diarrhea   BPH (benign prostatic hyperplasia)   Essential hypertension   AKI (acute kidney injury) [facility])    Hospital Course: Percell Lamboy. is a 77 y.o. male with medical history significant for insulin-dependent type 2 diabetes, multiple myeloma in remission, on Eliquis for recently diagnosed PE who has had multiple hospital admissions due to chronic diarrhea of unclear etiology resulting in electrolyte abnormalities. He was recently in the hospitalist service from 4/13 to 4/16 for the same reason. Presents to the ER with continued baseline diarrhea, but complaints of progressing RLQ abdominal pain. In the ED, vital signs stable, and labs with mild AKI. CT scan of the abdomen pelvis done showed possible appendiceal thickening. General surgery consulted. Pt admitted for further management.     Today, patient denies any new complaints, denies any nausea/vomiting, fever/chills.  Reports some chronic cough.  Discussed with wife at bedside.    Assessment and Plan:  Acute appendicitis s/p laparoscopic appendectomy on 4/28 Currently afebrile, with no leukocytosis CT abdomen/pelvis showed possible appendiceal thickening General surgery consulted, s/p surgery on 4/28 Follow-up with PCP in 1 week and surgery as scheduled   COPD Stable, room air Chest x-ray showed chronic lung disease with mild bibasilar atelectasis, otherwise  unremarkable DuoNeb, continue inhalers, Mucinex   Hypokalemia Replaced as needed   History of PE Continue Eliquis   Chronic diarrhea Continue as needed Imodium, continue colestipol   Type 2 diabetes mellitus Last A1c on 08/23/2022 was 6 Continue home regimen   Hypertension BP stable Continue amlodipine   BPH Continue finasteride, Flomax  Chronic pain syndrome Continue narcotics, muscle relaxants     Pain control - [place] Controlled Substance Reporting System database was reviewed. and patient was instructed, not to drive, operate heavy machinery, perform activities at heights, swimming or participation in water activities or provide baby-sitting services while on Pain, Sleep and Anxiety Medications; until their outpatient Physician has advised to do so again. Also recommended to not to take more than prescribed Pain, Sleep and Anxiety Medications.    Consultants: General surgery Procedures performed: Upper scopic appendectomy Disposition: Home health Diet recommendation:  Cardiac and Carb modified diet    DISCHARGE MEDICATION: Allergies as of 11/28/2022       Reactions   Invokana [canagliflozin] Anaphylaxis   Facial/neck/ lip swelling   Ace Inhibitors Cough        Medication List     TAKE these medications    acetaminophen 325 MG tablet Commonly known as: TYLENOL Take 3 tablets (975 mg total) by mouth every 6 (six) hours as needed.   amLODipine 10 MG tablet Commonly known as: NORVASC Take 1 tablet (10 mg total) by mouth every morning.   Calcium-Vitamin D-Minerals 600-400 MG-UNIT Chew Chew 1 tablet by mouth in the morning and at bedtime.   colestipol 1 g tablet Commonly known as: COLESTID Take 2 g by mouth 2 (two) times daily. Noon and bedtime   dextromethorphan-guaiFENesin 30-600 MG  12hr tablet Commonly known as: MUCINEX DM Take 1 tablet by mouth 2 (two) times daily for 7 days.   Eliquis 5 MG Tabs tablet Generic drug: apixaban Take 5  mg by mouth 2 (two) times daily.   Ferrous Fumarate 324 (106 Fe) MG Tabs tablet Commonly known as: HEMOCYTE - 106 mg FE Take 1 tablet by mouth every morning.   finasteride 5 MG tablet Commonly known as: PROSCAR Take 5 mg by mouth every morning.   furosemide 20 MG tablet Commonly known as: LASIX TAKE 1 TABLET BY MOUTH EVERY DAY What changed: when to take this   gabapentin 400 MG capsule Commonly known as: NEURONTIN Take 400-800 mg by mouth See admin instructions. Take 400mg  by mouth at lunch time and 800 mg by mouth at bedtime.   HYDROcodone-acetaminophen 10-325 MG tablet Commonly known as: NORCO Take 1 tablet by mouth every 6 (six) hours as needed for moderate pain or severe pain.   lenalidomide 15 MG capsule Commonly known as: REVLIMID Take 1 capsule (15 mg total) by mouth daily. Take 1 capsule for 21 days and then off for 7 days. Discard capsules already on hand.   lidocaine 5 % Commonly known as: Lidoderm Place 1 patch onto the skin daily. Remove & Discard patch within 12 hours or as directed by MD   loperamide 2 MG tablet Commonly known as: Imodium A-D Take 1 tablet (2 mg total) by mouth 3 (three) times daily as needed for diarrhea or loose stools.   losartan 50 MG tablet Commonly known as: COZAAR Take 1 tablet (50 mg total) by mouth every morning.   morphine 30 MG 12 hr tablet Commonly known as: MS CONTIN Take 30 mg by mouth every 12 (twelve) hours.   naloxone 4 MG/0.1ML Liqd nasal spray kit Commonly known as: NARCAN Place into the nose.   pantoprazole 40 MG tablet Commonly known as: PROTONIX Take 40 mg by mouth 2 (two) times daily.   pioglitazone 15 MG tablet Commonly known as: ACTOS Take 15 mg by mouth in the morning.   potassium chloride 10 MEQ tablet Commonly known as: KLOR-CON Take 10 mEq by mouth every morning.   Restasis 0.05 % ophthalmic emulsion Generic drug: cycloSPORINE Place 1 drop into both eyes 2 (two) times daily as needed for dry  eyes.   tamsulosin 0.4 MG Caps capsule Commonly known as: FLOMAX Take 0.4 mg by mouth daily.   tiZANidine 4 MG tablet Commonly known as: ZANAFLEX Take 1 tablet (4 mg total) by mouth every 8 (eight) hours as needed for muscle spasms.   Trelegy Ellipta 200-62.5-25 MCG/ACT Aepb Generic drug: Fluticasone-Umeclidin-Vilant Take 1 puff by mouth 2 (two) times daily.   Evaristo Bury FlexTouch 100 UNIT/ML FlexTouch Pen Generic drug: insulin degludec Inject 15 Units into the skin at bedtime.   vitamin B-12 500 MCG tablet Commonly known as: CYANOCOBALAMIN Take 500 mcg by mouth every morning.   zolpidem 5 MG tablet Commonly known as: AMBIEN Take 1 tablet (5 mg total) by mouth at bedtime as needed for sleep.        Follow-up Information     Maczis, Hedda Slade, PA-C Follow up on 12/19/2022.   Specialty: General Surgery Why: 11:15 am, Arrive 30 minutes prior to your appointment time, Please bring your insurance card and photo ID Contact information: [address] [facility] [place] [address] [phone]         Wilmer Floor, NP. Schedule an appointment as soon as possible  for a visit in 1 week(s).   Contact information: [address] [place] [address] [phone]                Discharge Exam: Ceasar Mons Weights   11/24/22 1147  Weight: 89 kg   General: NAD  Cardiovascular: S1, S2 present Respiratory: CTAB Abdomen: Soft, tender, nondistended, bowel sounds present, lap incisions C/D/I Musculoskeletal: No bilateral pedal edema noted Skin: Normal Psychiatry: Normal mood   Condition at discharge: stable  The results of significant diagnostics from this hospitalization (including imaging, microbiology, ancillary and laboratory) are listed below for reference.   Imaging Studies: CT ABDOMEN PELVIS WO CONTRAST  Result Date: 11/24/2022 CLINICAL DATA:  Diverticulitis. Epigastric pain with diarrhea, congestion, and cough.  History of multiple myeloma. EXAM: CT ABDOMEN AND PELVIS WITHOUT CONTRAST TECHNIQUE: Multidetector CT imaging of the abdomen and pelvis was performed following the standard protocol without IV contrast. RADIATION DOSE REDUCTION: This exam was performed according to the departmental dose-optimization program which includes automated exposure control, adjustment of the mA and/or kV according to patient size and/or use of iterative reconstruction technique. COMPARISON:  11/11/2022 FINDINGS: Poor signal to noise ratio and streak artifact in the upper abdomen due to arm positioning. Lower chest: Mild airway plugging in both lower lobes with mild atelectasis in the lower lobes and lingula. Descending thoracic aortic atherosclerosis. Mild cardiomegaly. Small pericardial effusion. Hepatobiliary: Stable hepatic cysts warranting no further imaging workup. Cholecystectomy. Pancreas: Unremarkable Spleen: Unremarkable Adrenals/Urinary Tract: 9 by 6 mm right kidney lower pole hypodense lesion with internal fat density posteriorly, compatible with small angiomyolipoma. No further imaging workup of this lesion is indicated. No hydronephrosis although there is mild distal left hydroureter extending to the UVJ. No urinary tract calculi identified. Adrenal glands unremarkable. Stomach/Bowel: Mild fusiform thickening of the appendix up to 0.8 cm in diameter on image 73 series 7, previously 0.7 cm. No periappendiceal inflammatory findings. Accentuated density along the appendiceal orifice probably from barium contrast medium given absence on prior imaging. No dilated bowel. Vascular/Lymphatic: Atherosclerosis is present, including aortoiliac atherosclerotic disease. Reproductive: Unremarkable Other: No supplemental non-categorized findings. Musculoskeletal: Bony demineralization noted. Prominent previous compression fracture at L1 and moderate prior compression fractures at L2 and L4 appear unchanged. Somewhat irregular lucency in the  central S1 vertebra on image 49 series 2. Rectus diastasis with anterior abdominal wall laxity. IMPRESSION: 1. Mild abnormal fusiform thickening of the appendix up to 0.8 cm in diameter, previously 0.7 cm. No periappendiceal inflammatory findings. Correlate with specific right lower quadrant tenderness in further assessment for early acute appendicitis. 2. Mild airway plugging in both lower lobes with mild atelectasis in the lower lobes and lingula. 3. Mild cardiomegaly with small pericardial effusion. 4. Mild distal left hydroureter extending to the UVJ. No hydronephrosis or urinary tract calculi identified. 5. Bony demineralization with prior compression fractures at L1, L2, and L4. Somewhat irregular lucency in the central S1 vertebra. Some of the visualize lucencies may be a manifestation of the patient's myeloma. 6. Aortic atherosclerosis. Aortic Atherosclerosis (ICD10-I70.0). Electronically Signed   By: Gaylyn Rong M.D.   On: 11/24/2022 14:09   DG Chest 2 View  Result Date: 11/24/2022 CLINICAL DATA:  Wheezing. Epigastric pain with diarrhea, congestion, cough, chills, headache x3 weeks. EXAM: CHEST - 2 VIEW COMPARISON:  Radiographs 11/10/2022 and 08/23/2022.  CT 08/23/2022. FINDINGS: Suboptimal inspiration. The heart size and mediastinal contours are stable. Evidence of mild chronic lung disease with scattered scarring and mild bibasilar atelectasis. No airspace disease, edema,  pleural effusion or pneumothorax identified. The bones are diffusely demineralized without evidence of acute osseous abnormality. IMPRESSION: Chronic lung disease with mild bibasilar atelectasis. No acute cardiopulmonary process identified. Electronically Signed   By: Carey Bullocks M.D.   On: 11/24/2022 13:44   CT ABDOMEN PELVIS W CONTRAST  Result Date: 11/11/2022 CLINICAL DATA:  Abdominal pain, acute, nonlocalized. EXAM: CT ABDOMEN AND PELVIS WITH CONTRAST TECHNIQUE: Multidetector CT imaging of the abdomen and pelvis  was performed using the standard protocol following bolus administration of intravenous contrast. RADIATION DOSE REDUCTION: This exam was performed according to the departmental dose-optimization program which includes automated exposure control, adjustment of the mA and/or kV according to patient size and/or use of iterative reconstruction technique. CONTRAST:  OMNIPAQUE IOHEXOL 300 MG/ML  SOLN COMPARISON:  08/19/2022. FINDINGS: Lower chest: Heart is enlarged and there is a trace pericardial effusion. Coronary artery calcifications are noted. Atelectasis or scarring is noted at the lung bases. There is a stable 6 mm nodule in the right middle lobe. Bronchiectasis with subpleural fibrosis and honeycombing is noted at the lung bases. Hepatobiliary: Stable scattered hypodensities are present in the liver, the largest are cystic in attenuation. The gallbladder is surgically absent. No biliary ductal dilatation. Pancreas: Unremarkable. No pancreatic ductal dilatation or surrounding inflammatory changes. Spleen: Normal in size without focal abnormality. Adrenals/Urinary Tract: The adrenal glands are within normal limits. The kidneys enhance symmetrically. Stable hypodensities are present in the kidneys bilaterally, the largest cystic in attenuation. 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 Additional subcentimeter hypodensities are noted which are too small to further characterize. No renal calculus or hydronephrosis. The bladder is unremarkable. Stomach/Bowel: Stomach is within normal limits. Appendix appears normal. No bowel obstruction, free air or pneumatosis. There is mild thickening of the walls of the ascending colon. Vascular/Lymphatic: Aortic atherosclerosis. No enlarged abdominal or pelvic lymph nodes. Reproductive: Prostate is unremarkable. Other: No abdominopelvic ascites. Musculoskeletal: There is a stable compression deformity at L1. A stable  compression deformity is noted in the superior endplate at L4. There is stable mild anterior wedging of the L2 vertebral body. Degenerative changes are present in the thoracic spine. No acute osseous abnormality. IMPRESSION: 1. Mild thickening of the wall of the ascending colon, which may be in part due to underdistention versus colitis. 2. Stable 6 mm nodule in the right middle lobe. 3. Stable fibrotic changes at the lung bases. 4. Coronary artery calcifications and a aortic atherosclerosis. 5. Remaining incidental findings as described above. Electronically Signed   By: Thornell Sartorius M.D.   On: 11/11/2022 01:23   DG Chest 2 View  Result Date: 11/10/2022 CLINICAL DATA:  Chest pain, shortness of breath EXAM: CHEST - 2 VIEW COMPARISON:  08/23/2022 FINDINGS: Lungs are essentially clear. No focal consolidation. No pleural effusion or pneumothorax. Chronic blunting of the left costophrenic angle likely reflects mild subpleural scarring when correlating with prior CT. The heart is normal in size. Degenerative changes of the visualized thoracolumbar spine. IMPRESSION: Normal chest radiographs. Electronically Signed   By: Charline Bills M.D.   On: 11/10/2022 19:14    Microbiology: Results for orders placed or performed during the hospital encounter of 11/10/22  C Difficile Quick Screen w PCR reflex     Status: None   Collection Time: 11/12/22 11:30 AM   Specimen: STOOL  Result Value Ref Range Status   C Diff antigen NEGATIVE NEGATIVE Final   C Diff toxin NEGATIVE NEGATIVE Final  C Diff interpretation No C. difficile detected.  Final    Comment: Performed at [facility], [address] [place], [place] [address]  Gastrointestinal Panel by PCR , Stool     Status: None   Collection Time: 11/12/22 11:30 AM   Specimen: STOOL  Result Value Ref Range Status   Campylobacter species NOT DETECTED NOT DETECTED Final   Plesimonas shigelloides NOT DETECTED NOT DETECTED Final   Salmonella  species NOT DETECTED NOT DETECTED Final   Yersinia enterocolitica NOT DETECTED NOT DETECTED Final   Vibrio species NOT DETECTED NOT DETECTED Final   Vibrio cholerae NOT DETECTED NOT DETECTED Final   Enteroaggregative E coli (EAEC) NOT DETECTED NOT DETECTED Final   Enteropathogenic E coli (EPEC) NOT DETECTED NOT DETECTED Final   Enterotoxigenic E coli (ETEC) NOT DETECTED NOT DETECTED Final   Shiga like toxin producing E coli (STEC) NOT DETECTED NOT DETECTED Final   Shigella/Enteroinvasive E coli (EIEC) NOT DETECTED NOT DETECTED Final   Cryptosporidium NOT DETECTED NOT DETECTED Final   Cyclospora cayetanensis NOT DETECTED NOT DETECTED Final   Entamoeba histolytica NOT DETECTED NOT DETECTED Final   Giardia lamblia NOT DETECTED NOT DETECTED Final   Adenovirus F40/41 NOT DETECTED NOT DETECTED Final   Astrovirus NOT DETECTED NOT DETECTED Final   Norovirus GI/GII NOT DETECTED NOT DETECTED Final   Rotavirus A NOT DETECTED NOT DETECTED Final   Sapovirus (I, II, IV, and V) NOT DETECTED NOT DETECTED Final    Comment: Performed at [facility], [address] [place], [place] [address]    Labs: CBC: Recent Labs  Lab 11/24/22 1248 11/25/22 0451 11/26/22 0505 11/27/22 0510 11/28/22 0045  WBC 4.0 3.7* 3.0* 4.8 4.2  NEUTROABS 2.6  --  1.4* 3.7 2.7  HGB 12.4* 11.1* 10.2* 11.4* 10.3*  HCT 37.8* 34.7* 34.6* 35.6* 31.7*  MCV 89.6 90.8 98.9 90.4 90.8  PLT 168 144* 124* 136* 126*   Basic Metabolic Panel: Recent Labs  Lab 11/24/22 1248 11/25/22 0451 11/25/22 1458 11/26/22 0754 11/27/22 0510 11/28/22 0045  NA 135 138  --  138 137 136  K 4.2 3.1*  --  4.1 4.3 3.6  CL 102 105  --  107 105 107  CO2 22 22  --  20* 23 23  GLUCOSE 159* 79  --  92 160* 137*  BUN 12 9  --  6* 9 14  CREATININE 1.29* 1.14  --  1.13 1.13 1.22  CALCIUM 8.8* 8.3*  --  8.4* 8.8* 8.2*  MG 2.0  --  2.0  --   --   --    Liver Function Tests: Recent Labs  Lab 11/24/22 1248  AST 22  ALT 13  ALKPHOS 84   BILITOT 1.3*  PROT 7.6  ALBUMIN 4.1   CBG: Recent Labs  Lab 11/27/22 1120 11/27/22 1657 11/27/22 2039 11/28/22 0719 11/28/22 1133  GLUCAP 118* 163* 138* 102* 140*    Discharge time spent: less than 30 minutes.  Signed: Briant Cedar, MD [facility] 11/28/2022

## 2022-11-28 NOTE — Evaluation (Signed)
Occupational Therapy Evaluation Patient Details Name: James [name]. MRN: [id number] DOB: November 09, 1945 Today's Date: 11/28/2022   History of Present Illness 77 y.o. male with medical history significant for insulin-dependent type 2 diabetes, multiple myeloma in remission, on Eliquis for recently diagnosed PE who has had multiple hospital admissions due to chronic diarrhea of unclear etiology resulting in electrolyte abnormalities. He was recently in the hospitalist service from 4/13 to 4/16 for the same reason. Presents to the ER with continued baseline diarrhea, but complaints of progressing RLQ abdominal pain.Dx of appendicitis, s/p appendectomy 11/26/22. Pt had recent R olecranon fx 10/18/22 2* a fall, has a posterior long arm splint in room, pt not wearing it at time of this evaluation, he was not able to state parameters of when he is to wear it.   Clinical Impression   Patient evaluated by Occupational Therapy with no further acute OT needs identified. All education has been completed and the patient has no further questions. Patient appears to be at baseline with patient not receptive to any education or compensatory strategies from therapist.  See below for any follow-up Occupational Therapy or equipment needs. OT is signing off. Thank you for this referral.       Recommendations for follow up therapy are one component of a multi-disciplinary discharge planning process, led by the attending physician.  Recommendations may be updated based on patient status, additional functional criteria and insurance authorization.   Assistance Recommended at Discharge Intermittent Supervision/Assistance  Patient can return home with the following Assistance with cooking/housework;Assist for transportation;Help with stairs or ramp for entrance;Direct supervision/assist for medications management    Functional Status Assessment  Patient has not had a recent decline in their functional status  Equipment  Recommendations  None recommended by OT       Precautions / Restrictions Precautions Precautions: Fall Precaution Comments: fall 10/18/22 Required Braces or Orthoses: Splint/Cast Splint/Cast: R posterior long arm  splint in room (pt not wearing it upon my entry), per chart pt had R olecranon fx 10/18/22, pt not able to state parameters of when he is to wear it. Pt donned splint for walking in hallway. Restrictions Weight Bearing Restrictions: Yes RUE Weight Bearing: Non weight bearing Other Position/Activity Restrictions: assumed with splint      Mobility Bed Mobility Overal bed mobility: Needs Assistance       Supine to sit: Supervision     General bed mobility comments: woudl benefit from education if he would be receptive to it.          ADL either performed or assessed with clinical judgement   ADL Overall ADL's : At baseline           General ADL Comments: patient was in bed with wife present in room at start of session. patient reported that he was agreeable to participate in session but noted to be easily irritated with therapist with patient wanting things completed in specific way but not vocalizing the desire until after therapist had inquired or attempted to assist patient. patient was noted to have foley cath line underneath BLE lying in bed. patient was educated that it was safer to have line on top of legs to transition out of bed when scooting to edge to prevent pulling. patient disagreed with therapist reporting that he knows what he is doing and does not need someone who is 3x younger than him to tell him how to get things done. patient noted to had verbal discomfort with scooting to EOB  with patietn continuing to decline for therapist assistance to readjust line positioning. patient once standing was notd to move line to the front of legs and then report " see this is how it is done". patient was able to engage in functional mobility in hallway with personal cane  after declining to demonstrate how to participate in simulated LB dressing, balanace, and toileting tasks. patient was noted to have less agitation with therapist towards end of session.     Vision   Vision Assessment?: No apparent visual deficits            Pertinent Vitals/Pain Pain Assessment Pain Assessment: Faces Faces Pain Scale: Hurts little more Pain Location: catheter with movement (patient not willing to allow therapist to help with positioning of lines to reduce pressure with reports "you dont know what you are doing i am 3x your age". Pain Descriptors / Indicators: Grimacing Pain Intervention(s): Monitored during session, Repositioned        Extremity/Trunk Assessment Upper Extremity Assessment RUE Deficits / Details: custom splint for R elbow with patient noted to have some straps loose. patient declined for therapist to assist with fixing slint reporting " i will get it fixed when i get back to therapy". patient had no imput on how often to wear splint, or when he needs it. RUE:  (recent fracture of elbow no MMT or ROM tested with unclear ROM and WB restrictions at this time. patietn unable to communicate if there are any restrictions or not.)   Lower Extremity Assessment Lower Extremity Assessment: Defer to PT evaluation   Cervical / Trunk Assessment Cervical / Trunk Assessment: Normal   Communication Communication Communication: No difficulties   Cognition Arousal/Alertness: Awake/alert Behavior During Therapy: WFL for tasks assessed/performed Overall Cognitive Status: Difficult to assess             General Comments: patient was not forthcoming with information and was agitated with education when provided. patient appears to be under the assumption that age means that he has all the knowledge. wife did not assist with getting patient to redirect or cooperate                Home Living Family/patient expects to be discharged to:: Private  residence Living Arrangements: Spouse/significant other Available Help at Discharge: Family;Available 24 hours/day Type of Home: House Home Access: Stairs to enter [employer] of Steps: 3 Entrance Stairs-Rails: None Home Layout: One level     Bathroom Shower/Tub: Producer, television/film/video: Standard Bathroom Accessibility: Yes   Home Equipment: Agricultural consultant (2 wheels);Rollator (4 wheels);BSC/3in1;Cane - single point;Hospital bed          Prior Functioning/Environment Prior Level of Function : Independent/Modified Independent;Driving             Mobility Comments: walks with SPC when leaving home ADLs Comments: independent in everything                 OT Goals(Current goals can be found in the care plan section) Acute Rehab OT Goals Patient Stated Goal: none stated OT Goal Formulation: All assessment and education complete, DC therapy  OT Frequency:         AM-PAC OT "6 Clicks" Daily Activity     Outcome Measure Help from another person eating meals?: None Help from another person taking care of personal grooming?: None Help from another person toileting, which includes using toliet, bedpan, or urinal?: A Little Help from another person bathing (including washing, rinsing, drying)?: None  Help from another person to put on and taking off regular upper body clothing?: None Help from another person to put on and taking off regular lower body clothing?: A Little 6 Click Score: 22   End of Session Equipment Utilized During Treatment: Other (comment) (personal cane and custom splint for R elbow) Nurse Communication: Other (comment) (patients perceived behaviors during session)  Activity Tolerance: Patient tolerated treatment well Patient left: in chair;with call bell/phone within reach;with family/visitor present  OT Visit Diagnosis: Unsteadiness on feet (R26.81)                Time: 1100-1131 OT Time Calculation (min): 31 min Charges:  OT  General Charges $OT Visit: 1 Visit OT Evaluation $OT Eval Low Complexity: 1 Low OT Treatments $Self Care/Home Management : 8-22 mins  Rosalio Loud, MS Acute Rehabilitation Department Office# [phone]   Selinda Flavin 11/28/2022, 12:13 PM

## 2022-11-28 NOTE — Plan of Care (Signed)
Patient is stable for discharge. Discharge instructions have been given. All questions answered, patient is discharged to home with spouse.  

## 2022-12-01 ENCOUNTER — Other Ambulatory Visit: Payer: Self-pay | Admitting: Physician Assistant

## 2022-12-01 ENCOUNTER — Inpatient Hospital Stay: Payer: Medicare HMO | Attending: Hematology and Oncology

## 2022-12-01 ENCOUNTER — Inpatient Hospital Stay (HOSPITAL_BASED_OUTPATIENT_CLINIC_OR_DEPARTMENT_OTHER): Payer: Medicare HMO | Admitting: Physician Assistant

## 2022-12-01 ENCOUNTER — Other Ambulatory Visit: Payer: Self-pay

## 2022-12-01 VITALS — BP 117/67 | HR 84 | Temp 98.2°F | Resp 13 | Wt 188.6 lb

## 2022-12-01 DIAGNOSIS — Z7984 Long term (current) use of oral hypoglycemic drugs: Secondary | ICD-10-CM | POA: Diagnosis not present

## 2022-12-01 DIAGNOSIS — Z79899 Other long term (current) drug therapy: Secondary | ICD-10-CM | POA: Diagnosis not present

## 2022-12-01 DIAGNOSIS — Z86711 Personal history of pulmonary embolism: Secondary | ICD-10-CM | POA: Insufficient documentation

## 2022-12-01 DIAGNOSIS — E876 Hypokalemia: Secondary | ICD-10-CM | POA: Diagnosis not present

## 2022-12-01 DIAGNOSIS — C9001 Multiple myeloma in remission: Secondary | ICD-10-CM

## 2022-12-01 DIAGNOSIS — Z794 Long term (current) use of insulin: Secondary | ICD-10-CM | POA: Insufficient documentation

## 2022-12-01 DIAGNOSIS — Z87891 Personal history of nicotine dependence: Secondary | ICD-10-CM | POA: Diagnosis not present

## 2022-12-01 DIAGNOSIS — E119 Type 2 diabetes mellitus without complications: Secondary | ICD-10-CM | POA: Diagnosis not present

## 2022-12-01 DIAGNOSIS — K5900 Constipation, unspecified: Secondary | ICD-10-CM | POA: Insufficient documentation

## 2022-12-01 DIAGNOSIS — Z7901 Long term (current) use of anticoagulants: Secondary | ICD-10-CM | POA: Diagnosis not present

## 2022-12-01 DIAGNOSIS — Z7952 Long term (current) use of systemic steroids: Secondary | ICD-10-CM | POA: Diagnosis not present

## 2022-12-01 DIAGNOSIS — Z7961 Long term (current) use of immunomodulator: Secondary | ICD-10-CM | POA: Insufficient documentation

## 2022-12-01 LAB — CMP (CANCER CENTER ONLY)
ALT: 13 U/L (ref 0–44)
AST: 13 U/L — ABNORMAL LOW (ref 15–41)
Albumin: 4 g/dL (ref 3.5–5.0)
Alkaline Phosphatase: 66 U/L (ref 38–126)
Anion gap: 8 (ref 5–15)
BUN: 16 mg/dL (ref 8–23)
CO2: 26 mmol/L (ref 22–32)
Calcium: 8.6 mg/dL — ABNORMAL LOW (ref 8.9–10.3)
Chloride: 105 mmol/L (ref 98–111)
Creatinine: 1.32 mg/dL — ABNORMAL HIGH (ref 0.61–1.24)
GFR, Estimated: 55 mL/min — ABNORMAL LOW (ref >60)
Glucose, Bld: 150 mg/dL — ABNORMAL HIGH (ref 70–99)
Potassium: 3.2 mmol/L — ABNORMAL LOW (ref 3.5–5.1)
Sodium: 139 mmol/L (ref 135–145)
Total Bilirubin: 0.6 mg/dL (ref 0.3–1.2)
Total Protein: 6.5 g/dL (ref 6.5–8.1)

## 2022-12-01 LAB — CBC WITH DIFFERENTIAL (CANCER CENTER ONLY)
Abs Immature Granulocytes: 0.01 10*3/uL (ref 0.00–0.07)
Basophils Absolute: 0 10*3/uL (ref 0.0–0.1)
Basophils Relative: 1 %
Eosinophils Absolute: 0.1 10*3/uL (ref 0.0–0.5)
Eosinophils Relative: 2 %
HCT: 32 % — ABNORMAL LOW (ref 39.0–52.0)
Hemoglobin: 10.6 g/dL — ABNORMAL LOW (ref 13.0–17.0)
Immature Granulocytes: 0 %
Lymphocytes Relative: 26 %
Lymphs Abs: 1.2 10*3/uL (ref 0.7–4.0)
MCH: 29.9 pg (ref 26.0–34.0)
MCHC: 33.1 g/dL (ref 30.0–36.0)
MCV: 90.1 fL (ref 80.0–100.0)
Monocytes Absolute: 0.5 10*3/uL (ref 0.1–1.0)
Monocytes Relative: 11 %
Neutro Abs: 2.9 10*3/uL (ref 1.7–7.7)
Neutrophils Relative %: 60 %
Platelet Count: 196 10*3/uL (ref 150–400)
RBC: 3.55 MIL/uL — ABNORMAL LOW (ref 4.22–5.81)
RDW: 16.3 % — ABNORMAL HIGH (ref 11.5–15.5)
WBC Count: 4.8 10*3/uL (ref 4.0–10.5)
nRBC: 0 % (ref 0.0–0.2)

## 2022-12-01 MED ORDER — POTASSIUM CHLORIDE ER 20 MEQ PO TBCR: 20.0000 meq | EXTENDED_RELEASE_TABLET | Freq: Every day | ORAL | 2 refills | Status: DC

## 2022-12-03 MED ORDER — POTASSIUM CHLORIDE ER 20 MEQ PO TBCR: 20.0000 meq | EXTENDED_RELEASE_TABLET | Freq: Every day | ORAL | 0 refills | Status: DC

## 2022-12-03 NOTE — Progress Notes (Signed)
[facility] [phone]   [phone]  PROGRESS NOTE  Patient Care Team: James Floor, NP as PCP - General James Red, MD as PCP - Cardiology (Cardiology)  Hematological/Oncological History # IgA Kappa Multiple Myeloma in Remission 2017: diagnosed with IgA Kappa MM 01/28/2016: Revlimid at 25 mg daily for 21 days with dexamethasone at 20 mg weekly. Reached CR in Jan 2018 10/2018: relapsed disease with M spike of 3.9 g/dL IgM level of 1308.  May 2020: Revlimid 15 mg daily for 21 days out of a 28 day cycle with dexamethasone 40 mg weekly  May 2021: achieved a complete response to therapy. Started maintenance Revlimid 15 mg daily restarted in June 2021 with dexamethasone 4 mg weekly  09/07/2022: transition care to Dr. Leonides [name]   Interval History:  James [name]. 77 y.o. male with medical history significant for IgA Kappa MM who presents for a follow up visit. The patient's last visit was on 09/07/2022. In the interim since the last visit he was admitted for appendicitis and underwent appendectomy on 11/26/2022.   On exam today James [name] reports that he is slowly recovering from his surgery. His energy levels are slowly improving. He has been struggling with constipation and tried various interventions including prune juice, miralax and fleet enema without relief. He finally found relief with manual disimpaction. He reports that his appetite is stable. He denies nausea, vomiting or abdominal pain. He denies easy bruising or signs of bleeding. He continues to have diffuse pain that is managed with MS contin and Norco. He denies fevers, chills, sweats, shortness of breath, chest pain or cough. He has no other complaints.   A full 10 point ROS was otherwise negative.  MEDICAL HISTORY:  Past Medical History:  Diagnosis Date   Anemia    Arthritis    BPH (benign prostatic hyperplasia)    CHF (congestive heart failure) (HCC)    Chronic pain     Diabetes mellitus    Dyspnea    GERD (gastroesophageal reflux disease)    Headache(784.0)    migraines, sinus headaches   Hepatitis    C and B-treated   HLD (hyperlipidemia)    Hypertension    Leukemia (HCC)    Lupus (HCC)    Multiple myeloma (HCC)    Pneumonia 07/2011   Sleep apnea 03/2018   going for fitting on 04-29-18   Thyroid disease    goiter   Urinary frequency     SURGICAL HISTORY: Past Surgical History:  Procedure Laterality Date   ABDOMINAL SURGERY     BACK SURGERY     x 5    BIOPSY  01/02/2020   Procedure: BIOPSY;  Surgeon: Jeani Hawking, MD;  Location: [facility];  Service: Endoscopy;;   CHOLECYSTECTOMY     COLONOSCOPY     COLONOSCOPY WITH PROPOFOL N/A 01/02/2020   Procedure: COLONOSCOPY WITH PROPOFOL;  Surgeon: Jeani Hawking, MD;  Location: [facility];  Service: Endoscopy;  Laterality: N/A;   cyst removal skull  20 years ago   ETHMOIDECTOMY  10/12/2011   Procedure: ETHMOIDECTOMY;  Surgeon: Keturah Barre, MD;  Location: [facility];  Service: ENT;  Laterality: Bilateral;  bilateral maxillary sinus osteal enlargement, frontal sinusotomy   EYE SURGERY     bilat cataract with lens implants   HAND SURGERY     right finger   HERNIA REPAIR     MICROLARYNGOSCOPY N/A 02/18/2021   Procedure: MICROLARYNGOSCOPY with Excisional Biopsy;  Surgeon: Osborn Coho, MD;  Location:  [facility] OR;  Service: ENT;  Laterality: N/A;   POLYPECTOMY  01/02/2020   Procedure: POLYPECTOMY;  Surgeon: Jeani Hawking, MD;  Location: [facility];  Service: Endoscopy;;   ROTATOR CUFF REPAIR     bilateral   SHOULDER ARTHROSCOPY WITH ROTATOR CUFF REPAIR Left 05/18/2014   Procedure: SHOULDER ARTHROSCOPY WITH ROTATOR CUFF REPAIR;  Surgeon: Mable Paris, MD;  Location: [facility];  Service: Orthopedics;  Laterality: Left;  Left shoulder arthroscopy rotator cuff repair, subacromial decompression   sinus surgery     TONSILLECTOMY     TRIGGER FINGER RELEASE Right 05/02/2018    Procedure: RELEASE TRIGGER FINGER/A-1 PULLEY RIGHT SMALL FINGER;  Surgeon: Betha Loa, MD;  Location: [facility];  Service: Orthopedics;  Laterality: Right;    SOCIAL HISTORY: Social History   Socioeconomic History   Marital status: Married    Spouse name: Not on file   Number of children: Not on file   Years of education: Not on file   Highest education level: Not on file  Occupational History   Not on file  Tobacco Use   Smoking status: Former    Packs/day: 1.00    Years: 26.00    Total pack years: 26.00    Types: Cigarettes    Quit date: 05/12/1984    Years since quitting: 38.3   Smokeless tobacco: Never  Vaping Use   Vaping Use: Not on file  Substance and Sexual Activity   Alcohol use: No   Drug use: Not Currently    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 years ago   Sexual activity: Not Currently  Other Topics Concern   Not on file  Social History Narrative   Not on file   Social Determinants of Health   Financial Resource Strain: Not on file  Food Insecurity: No Food Insecurity (08/31/2022)   Hunger Vital Sign    Worried About Running Out of Food in the Last Year: Never true    Ran Out of Food in the Last Year: Never true  Transportation Needs: No Transportation Needs (08/31/2022)   PRAPARE - Administrator, Civil Service (Medical): No    Lack of Transportation (Non-Medical): No  Physical Activity: Not on file  Stress: Not on file  Social Connections: Not on file  Intimate Partner Violence: Not At Risk (08/31/2022)   Humiliation, Afraid, Rape, and Kick questionnaire    Fear of Current or Ex-Partner: No    Emotionally Abused: No    Physically Abused: No    Sexually Abused: No    FAMILY HISTORY: Family History  Problem Relation Age of Onset   Hyperlipidemia Mother    Hypertension Mother     ALLERGIES:  is allergic to invokana [canagliflozin] and ace inhibitors.  MEDICATIONS:  Current Outpatient Medications  Medication Sig  Dispense Refill   albuterol (VENTOLIN HFA) 108 (90 Base) MCG/ACT inhaler Inhale into the lungs.     amLODipine (NORVASC) 10 MG tablet Take 1 tablet (10 mg total) by mouth every morning. 30 tablet 0   APIXABAN (ELIQUIS) VTE STARTER PACK (10MG  AND 5MG ) Take 2 tablets (10 mg total) twice daily for 4 days then continue taking 1 tablet ( 5 mg) twice daily 74 each 0   bismuth subsalicylate (PEPTO BISMOL) 262 MG/15ML suspension Take 30 mLs by mouth every 6 (six) hours as needed for indigestion or diarrhea or loose stools.     Calcium Carbonate-Vitamin D (CALCIUM 600+D PO) Take 1 tablet by mouth  in the morning and at bedtime.     colestipol (COLESTID) 1 g tablet Take 1 g by mouth 2 (two) times daily.     dexamethasone (DECADRON) 4 MG tablet TAKE 5 TABLETS BY MOUTH ONCE A WEEK 64 tablet 0   diphenhydrAMINE HCl (ZZZQUIL) 50 MG/30ML LIQD Take 30 mLs by mouth at bedtime as needed (sleep).     Ferrous Fumarate (HEMOCYTE - 106 MG FE) 324 (106 Fe) MG TABS tablet Take 1 tablet by mouth 2 (two) times daily.     finasteride (PROSCAR) 5 MG tablet Take 5 mg by mouth daily.     furosemide (LASIX) 20 MG tablet TAKE 1 TABLET BY MOUTH EVERY DAY 90 tablet 3   gabapentin (NEURONTIN) 400 MG capsule Take 400-800 mg by mouth See admin instructions. Take 400mg  at lunch time and  2 Capsules (800 mg) at bedtime     HYDROcodone-acetaminophen (NORCO) 10-325 MG tablet Take 1 tablet by mouth in the morning, at noon, and at bedtime.     insulin degludec (TRESIBA FLEXTOUCH) 100 UNIT/ML FlexTouch Pen Inject into the skin daily.     lenalidomide (REVLIMID) 15 MG capsule Take 1 capsule (15 mg total) by mouth daily. Take 1 capsule for 21 days and then off for 7 days. Discard capsules already on hand. 21 capsule 0   lidocaine (LIDODERM) 5 % Place 1 patch onto the skin daily. Remove & Discard patch within 12 hours or as directed by MD 30 patch 0   loperamide (IMODIUM A-D) 2 MG tablet Take 1 tablet (2 mg total) by mouth 3 (three) times daily  as needed for diarrhea or loose stools. 20 tablet 0   losartan (COZAAR) 50 MG tablet Take 1 tablet (50 mg total) by mouth every morning. 30 tablet 0   morphine (MS CONTIN) 30 MG 12 hr tablet Take by mouth.     pantoprazole (PROTONIX) 40 MG tablet Take 40 mg by mouth 2 (two) times daily.     pioglitazone (ACTOS) 15 MG tablet Take 15 mg by mouth in the morning.     potassium chloride SA (KLOR-CON M) 20 MEQ tablet Take 1 tablet (20 mEq total) by mouth daily. Take 2 pills daily for 5 days then continue taking 1 pill daily 30 tablet 0   RESTASIS 0.05 % ophthalmic emulsion Place 1 drop into both eyes 2 (two) times daily as needed for dry eyes.     Tamsulosin HCl (FLOMAX) 0.4 MG CAPS Take 0.4 mg by mouth 2 (two) times daily.      tiZANidine (ZANAFLEX) 4 MG tablet Take 1 tablet (4 mg total) by mouth every 8 (eight) hours as needed for muscle spasms. 30 tablet 0   traMADol (ULTRAM) 50 MG tablet Take 50 mg by mouth every 6 (six) hours as needed for moderate pain or severe pain.     TRELEGY ELLIPTA 200-62.5-25 MCG/ACT AEPB Take 1 puff by mouth 2 (two) times daily. 60 each 0   vitamin B-12 (CYANOCOBALAMIN) 1000 MCG tablet Take 1,000 mcg by mouth in the morning and at bedtime.     zolpidem (AMBIEN) 5 MG tablet Take 1 tablet (5 mg total) by mouth at bedtime as needed for sleep. 5 tablet 0   No current facility-administered medications for this visit.    REVIEW OF SYSTEMS:   Constitutional: ( - ) fevers, ( - )  chills , ( - ) night sweats Eyes: ( - ) blurriness of vision, ( - ) double vision, ( - )  watery eyes Ears, nose, mouth, throat, and face: ( - ) mucositis, ( - ) sore throat Respiratory: ( - ) cough, ( - ) dyspnea, ( - ) wheezes Cardiovascular: ( - ) palpitation, ( - ) chest discomfort, ( - ) lower extremity swelling Gastrointestinal:  ( - ) nausea, ( - ) heartburn, ( +) change in bowel habits Skin: ( - ) abnormal skin rashes Lymphatics: ( - ) new lymphadenopathy, ( - ) easy bruising Neurological: (  - ) numbness, ( - ) tingling, ( - ) new weaknesses Behavioral/Psych: ( - ) mood change, ( - ) new changes  All other systems were reviewed with the patient and are negative.  PHYSICAL EXAMINATION: ECOG PERFORMANCE STATUS: 1 - Symptomatic but completely ambulatory  Vitals:   09/07/22 1440  BP: 134/66  Pulse: 85  Resp: 14  Temp: 98.3 F (36.8 C)  SpO2: 100%   Filed Weights   09/07/22 1440  Weight: 205 lb 4.8 oz (93.1 kg)    GENERAL: alert, no distress and comfortable SKIN: skin color, texture, turgor are normal, no rashes or significant lesions EYES: conjunctiva are pink and non-injected, sclera clear LUNGS: clear to auscultation and percussion with normal breathing effort HEART: regular rate & rhythm and no murmurs and no lower extremity edema Musculoskeletal: no cyanosis of digits and no clubbing  PSYCH: alert & oriented x 3, fluent speech NEURO: no focal motor/sensory deficits  LABORATORY DATA:  I have reviewed the data as listed    Latest Ref Rng & Units 09/07/2022    2:11 PM 08/31/2022    7:28 AM 08/30/2022    5:47 PM  CBC  WBC 4.0 - 10.5 K/uL 4.1  5.3  6.6   Hemoglobin 13.0 - 17.0 g/dL 82.9  56.2  13.0   Hematocrit 39.0 - 52.0 % 34.9  35.1  38.4   Platelets 150 - 400 K/uL 214  223  247        Latest Ref Rng & Units 09/07/2022    2:11 PM 09/01/2022    4:02 AM 08/31/2022    7:28 AM  CMP  Glucose 70 - 99 mg/dL 865  784  98   BUN 8 - 23 mg/dL 8  8  7    Creatinine 0.61 - 1.24 mg/dL 6.96  2.95  2.84   Sodium 135 - 145 mmol/L 139  140  143   Potassium 3.5 - 5.1 mmol/L 3.9  3.4  3.3   Chloride 98 - 111 mmol/L 108  108  112   CO2 22 - 32 mmol/L 24  26  26    Calcium 8.9 - 10.3 mg/dL 8.9  8.2  8.0   Total Protein 6.5 - 8.1 g/dL 6.0     Total Bilirubin 0.3 - 1.2 mg/dL 0.4     Alkaline Phos 38 - 126 U/L 87     AST 15 - 41 U/L 12     ALT 0 - 44 U/L 9       Lab Results  Component Value Date   MPROTEIN Not Observed 06/08/2022   MPROTEIN Not Observed 03/09/2022   MPROTEIN  Not Observed 11/01/2021   Lab Results  Component Value Date   KPAFRELGTCHN 31.6 (H) 06/08/2022   KPAFRELGTCHN 23.7 (H) 03/09/2022   KPAFRELGTCHN 19.2 11/01/2021   LAMBDASER 13.2 06/08/2022   LAMBDASER 14.2 03/09/2022   LAMBDASER 11.1 11/01/2021   KAPLAMBRATIO 2.39 (H) 06/08/2022   KAPLAMBRATIO 1.67 (H) 03/09/2022   KAPLAMBRATIO 1.73 (H) 11/01/2021  RADIOGRAPHIC STUDIES: US THYROID  Result Date: 08/27/2022 CLINICAL DATA:  Incidental on CT. EXAM: THYROID ULTRASOUND TECHNIQUE: Ultrasound examination of the thyroid gland and adjacent soft tissues was performed. COMPARISON:  None Available. FINDINGS: Parenchymal Echotexture: Normal Isthmus: 1.0 cm Right lobe: 5.5 x 2.7 x 2.4 cm Left lobe: 5.5 x 2.6 x 2.7 cm _________________________________________________________ Estimated total number of nodules >/= 1 cm: 3 Number of spongiform nodules >/=  2 cm not described below (TR1): 0 Number of mixed cystic and solid nodules >/= 1.5 cm not described below (TR2): 0 _________________________________________________________ Nodule # 1: Circumscribed anechoic cyst in the right mid gland measures 1.1 x 0.8 x 1.0 cm. This is considered sonographically benign. This nodule does NOT meet TI-RADS criteria for biopsy or dedicated follow-up. Nodule # 2: Anechoic simple cyst with peripheral colloid artifact in the right inferior gland measures up to 1.2 cm. This nodule does NOT meet TI-RADS criteria for biopsy or dedicated follow-up. Nodule # 3: Location: Left; Inferior Maximum size: 2.1 cm; Other 2 dimensions: 1.9 x 1.9 cm Composition: solid/almost completely solid (2) Echogenicity: isoechoic (1) Shape: not taller-than-wide (0) Margins: ill-defined (0) Echogenic foci: none (0) ACR TI-RADS total points: 3. ACR TI-RADS risk category: TR3 (3 points). ACR TI-RADS recommendations: *Given size (>/= 1.5 - 2.4 cm) and appearance, a follow-up ultrasound in 1 year should be considered based on TI-RADS criteria.  _________________________________________________________ IMPRESSION: 1. Ill-defined TI-RADS category 3 nodule versus pseudo nodule at the most inferior aspect of the left thyroid gland measures up to 2.1 cm. This nodule meets criteria for imaging surveillance. Recommend follow-up ultrasound in 1 year. The above is in keeping with the ACR TI-RADS recommendations - J Am Coll Radiol 2017;14:587-595. Electronically Signed   By: Malachy Moan M.D.   On: 08/27/2022 15:23   CT HEAD WO CONTRAST ( )  Result Date: 08/24/2022 CLINICAL DATA:  Mental status change, unknown cause recent trauma, new on Eliquis. rule out intracranial bleeding EXAM: CT HEAD WITHOUT CONTRAST TECHNIQUE: Contiguous axial images were obtained from the base of the skull through the vertex without intravenous contrast. RADIATION DOSE REDUCTION: This exam was performed according to the departmental dose-optimization program which includes automated exposure control, adjustment of the mA and/or kV according to patient size and/or use of iterative reconstruction technique. COMPARISON:  None Available. FINDINGS: Brain: No evidence of acute large vascular territory infarction, hemorrhage, hydrocephalus, extra-axial collection or mass lesion/mass effect. Patchy white hypodensities, nonspecific but compatible with chronic microvascular ischemic disease. Vascular: No hyperdense vessel identified. Calcific atherosclerosis. Skull: No acute fracture. Sinuses/Orbits: Largely clear sinuses.  No acute orbital findings. Other: No mastoid effusions IMPRESSION: No evidence of acute intracranial abnormality. Electronically Signed   By: Feliberto Harts M.D.   On: 08/24/2022 12:29   VAS [place] LOWER EXTREMITY VENOUS (DVT)  Result Date: 08/23/2022  Lower Venous DVT Study Patient Name:  Aymen Preszler.  Date of Exam:   08/23/2022 Medical Rec #: [id number]              Accession #:    [id number] Date of Birth: [birth year]              Patient Gender: M Patient  Age:   77 years Exam Location:  [facility] Procedure:      VAS [place] LOWER EXTREMITY VENOUS (DVT) Referring Phys: Madelyn Flavors --------------------------------------------------------------------------------  Indications: Pulmonary embolism.  Limitations: Constant patient movement and poor ultrasound/tissue interface. Comparison Study: 10-26-2021 Prior left lower extremity venous study was  negative for DVT. Performing Technologist: Jean Rosenthal RDMS, RVT  Examination Guidelines: A complete evaluation includes B-mode imaging, spectral Doppler, color Doppler, and power Doppler as needed of all accessible portions of each vessel. Bilateral testing is considered an integral part of a complete examination. Limited examinations for reoccurring indications may be performed as noted. The reflux portion of the exam is performed with the patient in reverse Trendelenburg.  +---------+---------------+---------+-----------+----------+-------------------+ RIGHT    CompressibilityPhasicitySpontaneityPropertiesThrombus Aging      +---------+---------------+---------+-----------+----------+-------------------+ CFV      Full           Yes      Yes                                      +---------+---------------+---------+-----------+----------+-------------------+ SFJ      Full                                                             +---------+---------------+---------+-----------+----------+-------------------+ FV Prox  Full                                                             +---------+---------------+---------+-----------+----------+-------------------+ FV Mid   Full                                                             +---------+---------------+---------+-----------+----------+-------------------+ FV DistalFull                                                              +---------+---------------+---------+-----------+----------+-------------------+ PFV      Full                                                             +---------+---------------+---------+-----------+----------+-------------------+ POP      Full           Yes      Yes                                      +---------+---------------+---------+-----------+----------+-------------------+ PTV      Full                                                             +---------+---------------+---------+-----------+----------+-------------------+  PERO                                                  Not well visualized +---------+---------------+---------+-----------+----------+-------------------+   +---------+---------------+---------+-----------+----------+-------------------+ LEFT     CompressibilityPhasicitySpontaneityPropertiesThrombus Aging      +---------+---------------+---------+-----------+----------+-------------------+ CFV      Full           Yes      Yes                                      +---------+---------------+---------+-----------+----------+-------------------+ SFJ      Full                                                             +---------+---------------+---------+-----------+----------+-------------------+ FV Prox  Full                                                             +---------+---------------+---------+-----------+----------+-------------------+ FV Mid   Full                                                             +---------+---------------+---------+-----------+----------+-------------------+ FV DistalFull                                                             +---------+---------------+---------+-----------+----------+-------------------+ PFV      Full                                                             +---------+---------------+---------+-----------+----------+-------------------+  POP      Full           Yes      Yes                                      +---------+---------------+---------+-----------+----------+-------------------+ PTV      Full                                                             +---------+---------------+---------+-----------+----------+-------------------+ PERO  Not well visualized +---------+---------------+---------+-----------+----------+-------------------+     Summary: RIGHT: - There is no evidence of deep vein thrombosis in the lower extremity.  - No cystic structure found in the popliteal fossa.  LEFT: - There is no evidence of deep vein thrombosis in the lower extremity.  - No cystic structure found in the popliteal fossa.  *See table(s) above for measurements and observations. Electronically signed by Coral Else MD on 08/23/2022 at 8:56:30 PM.    Final    ECHOCARDIOGRAM COMPLETE  Result Date: 08/23/2022    ECHOCARDIOGRAM REPORT   Patient Name:   Masonjames Schieffer. Date of Exam: 08/23/2022 Medical Rec #:  [id number]             Height:       72.0 in Accession #:    [id number]            Weight:       210.0 lb Date of Birth:  [birth year]             BSA:          2.175 m Patient Age:    76 years              BP:           188/106 mmHg Patient Gender: M                     HR:           99 bpm. Exam Location:  Inpatient Procedure: 2D Echo, Cardiac Doppler, Color Doppler and Intracardiac            Opacification Agent Indications:    Pulmonary embolism  History:        Patient has prior history of Echocardiogram examinations, most                 recent 11/15/2021. CAD, Arrythmias:RBBB; Risk                 Factors:Hypertension and Former Smoker.  Sonographer:    Ross Ludwig RDCS (AE) Referring Phys: [id number] [facility] A SMITH  Sonographer Comments: Technically difficult study due to poor echo windows, suboptimal parasternal window, suboptimal apical window and no subcostal window. Image  acquisition challenging due to uncooperative patient and Image acquisition challenging due to respiratory motion. IMPRESSIONS  1. Left ventricular ejection fraction, by estimation, is 60 to 65%. The left ventricle has normal function. The left ventricle has no regional wall motion abnormalities. Left ventricular diastolic parameters are indeterminate.  2. Right ventricular systolic function is normal. The right ventricular size is normal.  3. The mitral valve is abnormal. No evidence of mitral valve regurgitation. No evidence of mitral stenosis.  4. The aortic valve is normal in structure. There is mild calcification of the aortic valve. There is mild thickening of the aortic valve. Aortic valve regurgitation is not visualized. Aortic valve sclerosis is present, with no evidence of aortic valve stenosis.  5. The inferior vena cava is normal in size with greater than 50% respiratory variability, suggesting right atrial pressure of 3 mmHg. FINDINGS  Left Ventricle: Left ventricular ejection fraction, by estimation, is 60 to 65%. The left ventricle has normal function. The left ventricle has no regional wall motion abnormalities. Definity contrast agent was given IV to delineate the left ventricular  endocardial borders. The left ventricular internal cavity size was normal in size. There is no left ventricular hypertrophy. Left ventricular diastolic parameters are indeterminate. Right Ventricle:  The right ventricular size is normal. No increase in right ventricular wall thickness. Right ventricular systolic function is normal. Left Atrium: Left atrial size was normal in size. Right Atrium: Right atrial size was normal in size. Pericardium: There is no evidence of pericardial effusion. Mitral Valve: The mitral valve is abnormal. There is mild thickening of the mitral valve leaflet(s). There is mild calcification of the mitral valve leaflet(s). No evidence of mitral valve regurgitation. No evidence of mitral valve  stenosis. Tricuspid Valve: The tricuspid valve is normal in structure. Tricuspid valve regurgitation is not demonstrated. No evidence of tricuspid stenosis. Aortic Valve: The aortic valve is normal in structure. There is mild calcification of the aortic valve. There is mild thickening of the aortic valve. Aortic valve regurgitation is not visualized. Aortic valve sclerosis is present, with no evidence of aortic valve stenosis. Aortic valve mean gradient measures 3.0 mmHg. Aortic valve peak gradient measures 6.8 mmHg. Aortic valve area, by VTI measures 2.97 cm. Pulmonic Valve: The pulmonic valve was normal in structure. Pulmonic valve regurgitation is not visualized. No evidence of pulmonic stenosis. Aorta: The aortic root is normal in size and structure. Venous: The inferior vena cava is normal in size with greater than 50% respiratory variability, suggesting right atrial pressure of 3 mmHg. IAS/Shunts: The interatrial septum was not well visualized.  LEFT VENTRICLE PLAX 2D LVIDd:         5.10 cm LVIDs:         3.70 cm LV PW:         1.10 cm LV IVS:        1.00 cm LVOT diam:     2.30 cm LV SV:         59 LV SV Index:   27 LVOT Area:     4.15 cm  RIGHT VENTRICLE RV S prime:     12.60 cm/s LEFT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 08/12/2022 15:21   CT ABDOMEN PELVIS W  CONTRAST  Result Date: 08/12/2022 CLINICAL DATA:  Nonspecific abdominal pain EXAM: CT ABDOMEN AND PELVIS WITH CONTRAST TECHNIQUE: Multidetector CT imaging of the abdomen and pelvis was performed using the standard protocol following bolus administration of intravenous contrast. RADIATION DOSE REDUCTION: This exam was performed according to the departmental dose-optimization program which includes automated exposure control, adjustment of the mA and/or kV according to patient size and/or use of iterative reconstruction technique. CONTRAST:  OMNIPAQUE IOHEXOL 300 MG/ML  SOLN COMPARISON:  MRI lumbar spine 11/28/2016 FINDINGS: Lower chest: See concurrently obtained dedicated CT scan of the chest. Hepatobiliary: Normal hepatic contour and morphology. The gallbladder is surgically absent. No intra or extra biliary ductal dilatation. Numerous circumscribed low-attenuation lesions throughout the liver consistent with simple cysts. Pancreas: Unremarkable. No pancreatic ductal dilatation or surrounding inflammatory changes. Spleen: Normal in size without focal abnormality. Adrenals/Urinary Tract: Normal adrenal glands. A low-attenuation renal lesions are present bilaterally. These are too small for accurate characterization but are statistically highly likely benign cysts. No hydronephrosis, nephrolithiasis or enhancing renal mass. No imaging follow-up recommended. Stomach/Bowel: Stomach is within normal limits. Appendix appears normal. No evidence of bowel wall thickening, distention, or inflammatory changes. Vascular/Lymphatic: Scattered atherosclerotic vascular calcifications. No aneurysm or dissection. Reproductive: Prostate is unremarkable. Other: No evidence of ascites.  Fat containing umbilical hernia. Musculoskeletal: Similar appearance of multiple chronic lumbar compression fractures most significant at L1. Additional fractures of the inferior endplate of L2, inferior endplate of L3 and superior endplate of  L4 appears stable. IMPRESSION: 1. No acute abnormality within the abdomen or pelvis. 2. Hepatic and renal artery cysts. 3. Multiple chronic compression fractures. 4. Fat containing umbilical hernia. 5. Aortic atherosclerosis.  Aortic Atherosclerosis (ICD10-I70.0). Electronically Signed   By: Malachy Moan M.D.   On: 08/12/2022 15:16    ASSESSMENT & PLAN James [name]. 77 y.o. male with medical history significant for IgA Kappa MM who presents for a follow up visit.  # IgA Kappa Multiple Myeloma in Remission -- Currently Revlimid 15 mg p.o. daily 21 days on and 7 days off. PLAN:  --Labs today show WBC 4.8, Hgb 10.6, MCV 90.1, Plt 196. Creatinine 1.32, LFTs adequate. Myeloma labs pending. --Most recent myeloma labs from 11/03/2022 did not detect M protein. Kappa light chain is 45, Lambda 2.05, Ratio 2.05.  --Since patient recently underwent appendectomy,  recommend to hold Revlimid for 1-2 weeks. He can resume on 12/11/2022.  -- Recommend labs every 4 weeks with return to clinic in 3 months time.  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 -- After 6 months can consider  decreasing down to Eliquis 2.5 mg twice daily.  Would recommend continuation of this as long as the patient is on Revlimid therapy.  #Constipation: --Encouraged to have aggressive bowel regimen especially while on narcotics --Reviewed taking Senna-S 2 tablets daily and increase to twice daily if needed --Consider magnesium citrate if unable to have a bowel movement for several days.   #Elevated creatinine: --Encouraged increased PO hydration  #Hypokalemia: --Potassium level is 3.2.  --Gave 7 day course of potassium chloride 20 mEq.  No orders of the defined types were placed in this encounter.   All questions were answered. The patient knows to call the clinic with any problems, questions or concerns.  I have spent a total of 30 minutes minutes of  face-to-face and non-face-to-face time, preparing to see the patient, performing a medically appropriate examination, counseling and educating the patient, ordering tests/procedures,documenting clinical information in the electronic health record,  and care coordination.   Georga Kaufmann PA-C Dept of Hematology and Oncology [facility] at [facility] Phone: [phone]

## 2022-12-04 ENCOUNTER — Telehealth: Payer: Self-pay

## 2022-12-04 LAB — KAPPA/LAMBDA LIGHT CHAINS
Kappa free light chain: 43.6 mg/L — ABNORMAL HIGH (ref 3.3–19.4)
Kappa, lambda light chain ratio: 2.13 — ABNORMAL HIGH (ref 0.26–1.65)
Lambda free light chains: 20.5 mg/L (ref 5.7–26.3)

## 2022-12-04 NOTE — Telephone Encounter (Signed)
-----   Message from Briant Cedar, PA-C sent at 12/03/2022  2:10 PM EDT ----- Can you make sure pharmacy cancelled the 30 day supple of potassium supplement and only fills the 7 day supply.

## 2022-12-04 NOTE — Telephone Encounter (Signed)
Confirmed with pharmacy to cancel rx for Potassium 30 day supply and only fill 7 day supply for potassium  Attempted to contact pt but no answer and no voicemail.  Pt does not use My Chart

## 2022-12-08 LAB — MULTIPLE MYELOMA PANEL, SERUM
Albumin SerPl Elph-Mcnc: 3.5 g/dL (ref 2.9–4.4)
Albumin/Glob SerPl: 1.3 (ref 0.7–1.7)
Alpha 1: 0.3 g/dL (ref 0.0–0.4)
Alpha2 Glob SerPl Elph-Mcnc: 0.8 g/dL (ref 0.4–1.0)
B-Globulin SerPl Elph-Mcnc: 0.9 g/dL (ref 0.7–1.3)
Gamma Glob SerPl Elph-Mcnc: 0.8 g/dL (ref 0.4–1.8)
Globulin, Total: 2.7 g/dL (ref 2.2–3.9)
IgA: 185 mg/dL (ref 61–437)
IgG (Immunoglobin G), Serum: 865 mg/dL (ref 603–1613)
IgM (Immunoglobulin M), Srm: 18 mg/dL (ref 15–143)
Total Protein ELP: 6.2 g/dL (ref 6.0–8.5)

## 2022-12-11 ENCOUNTER — Telehealth: Payer: Self-pay

## 2022-12-11 NOTE — Telephone Encounter (Signed)
-----   Message from Briant Cedar, PA-C sent at 12/11/2022 12:12 PM EDT ----- Please notify patient that M protein is not detected and kappa light chain levels are overall stable.   ----- Message ----- From: Interface, Lab In [facility] Sent: 12/01/2022   1:04 PM EDT To: Briant Cedar, PA-C

## 2022-12-11 NOTE — Telephone Encounter (Signed)
LM for pt with lab results.   ?

## 2022-12-18 ENCOUNTER — Encounter (HOSPITAL_COMMUNITY): Payer: Self-pay

## 2022-12-18 ENCOUNTER — Other Ambulatory Visit: Payer: Self-pay

## 2022-12-18 ENCOUNTER — Emergency Department (HOSPITAL_COMMUNITY)
Admission: EM | Admit: 2022-12-18 | Discharge: 2022-12-18 | Disposition: A | Discharge status: Home / Self Care | Payer: Medicare HMO | Attending: Emergency Medicine | Admitting: Emergency Medicine

## 2022-12-18 ENCOUNTER — Emergency Department (HOSPITAL_COMMUNITY): Payer: Medicare HMO

## 2022-12-18 DIAGNOSIS — Z7901 Long term (current) use of anticoagulants: Secondary | ICD-10-CM | POA: Insufficient documentation

## 2022-12-18 DIAGNOSIS — R109 Unspecified abdominal pain: Secondary | ICD-10-CM | POA: Insufficient documentation

## 2022-12-18 DIAGNOSIS — R197 Diarrhea, unspecified: Secondary | ICD-10-CM | POA: Diagnosis present

## 2022-12-18 LAB — COMPREHENSIVE METABOLIC PANEL
ALT: 13 U/L (ref 0–44)
AST: 19 U/L (ref 15–41)
Albumin: 3.7 g/dL (ref 3.5–5.0)
Alkaline Phosphatase: 95 U/L (ref 38–126)
Anion gap: 12 (ref 5–15)
BUN: 7 mg/dL — ABNORMAL LOW (ref 8–23)
CO2: 19 mmol/L — ABNORMAL LOW (ref 22–32)
Calcium: 9.1 mg/dL (ref 8.9–10.3)
Chloride: 107 mmol/L (ref 98–111)
Creatinine, Ser: 1.25 mg/dL — ABNORMAL HIGH (ref 0.61–1.24)
GFR, Estimated: 59 mL/min — ABNORMAL LOW (ref >60)
Glucose, Bld: 147 mg/dL — ABNORMAL HIGH (ref 70–99)
Potassium: 3.4 mmol/L — ABNORMAL LOW (ref 3.5–5.1)
Sodium: 138 mmol/L (ref 135–145)
Total Bilirubin: 0.7 mg/dL (ref 0.3–1.2)
Total Protein: 7 g/dL (ref 6.5–8.1)

## 2022-12-18 LAB — URINALYSIS, ROUTINE W REFLEX MICROSCOPIC
Bacteria, UA: NONE SEEN
Bilirubin Urine: NEGATIVE
Glucose, UA: NEGATIVE mg/dL
Ketones, ur: NEGATIVE mg/dL
Leukocytes,Ua: NEGATIVE
Nitrite: NEGATIVE
Protein, ur: NEGATIVE mg/dL
Specific Gravity, Urine: 1.025 (ref 1.005–1.030)
pH: 5 (ref 5.0–8.0)

## 2022-12-18 LAB — CBC
HCT: 41.9 % (ref 39.0–52.0)
Hemoglobin: 13.7 g/dL (ref 13.0–17.0)
MCH: 29.5 pg (ref 26.0–34.0)
MCHC: 32.7 g/dL (ref 30.0–36.0)
MCV: 90.3 fL (ref 80.0–100.0)
Platelets: 225 10*3/uL (ref 150–400)
RBC: 4.64 MIL/uL (ref 4.22–5.81)
RDW: 18 % — ABNORMAL HIGH (ref 11.5–15.5)
WBC: 7 10*3/uL (ref 4.0–10.5)
nRBC: 0 % (ref 0.0–0.2)

## 2022-12-18 LAB — LIPASE, BLOOD: Lipase: 36 U/L (ref 11–51)

## 2022-12-18 LAB — MAGNESIUM: Magnesium: 1.7 mg/dL (ref 1.7–2.4)

## 2022-12-18 MED ORDER — SODIUM CHLORIDE 0.9 % IV BOLUS
1000.0000 mL | Freq: Once | INTRAVENOUS | Status: AC
  Administered 2022-12-18: 1000 mL via INTRAVENOUS

## 2022-12-18 MED ORDER — POTASSIUM CHLORIDE 10 MEQ/100ML IV SOLN
10.0000 meq | Freq: Once | INTRAVENOUS | Status: AC
  Administered 2022-12-18: 10 meq via INTRAVENOUS
  Filled 2022-12-18: qty 100

## 2022-12-18 MED ORDER — LOPERAMIDE HCL 2 MG PO CAPS: 2.0000 mg | ORAL_CAPSULE | Freq: Four times a day (QID) | ORAL | 0 refills | Status: DC | PRN

## 2022-12-18 MED ORDER — IOHEXOL 350 MG/ML SOLN
75.0000 mL | Freq: Once | INTRAVENOUS | Status: AC | PRN
  Administered 2022-12-18: 75 mL via INTRAVENOUS

## 2022-12-18 NOTE — ED Triage Notes (Signed)
Pt c/o diarrhea since yesterday; denies new meds, unusual food ingestion; appendectomy last week; denies fevers; hx multiple myeloma, on chemo, last chemo treatment last week; endorses some abd pain

## 2022-12-18 NOTE — ED Provider Triage Note (Signed)
Emergency Medicine Provider Triage Evaluation Note  James [name]. , a 77 y.o. male  was evaluated in triage.  Patient complains of diarrhea.  He reports that it started yesterday.  No changes in his medication regiment however he does report that he is on oral chemotherapy for multiple myeloma.  He takes 21 days of oral treatment and then has 7 days off.  Today is the sixth day off.  Also report a appendectomy 1 to 2 weeks ago.  No fevers.  No urinary incontinence.  No falls or back surgeries.  Is on chronic opioids  Review of Systems  Positive:  Negative:   Physical Exam  BP (!) 156/122   Pulse (!) 106   Temp 98.2 F (36.8 C) (Oral)   Resp 18   SpO2 98%  Gen:   Awake, no distress   Resp:  Normal effort  MSK:   Moves extremities without difficulty  Other:  Well-healed surgical scar to the right abdomen.  Some right upper quadrant and right lower quadrant tenderness with palpation.  Medical Decision Making  Medically screening exam initiated at 1:35 PM.  Appropriate orders placed.  [name] was informed that the remainder of the evaluation will be completed by another provider, this initial triage assessment does not replace that evaluation, and the importance of remaining in the ED until their evaluation is complete.     Saddie Benders, PA-C 12/18/22 1336

## 2022-12-18 NOTE — ED Provider Notes (Signed)
[place] EMERGENCY DEPARTMENT AT [facility] Provider Note   CSN: [id number] Arrival date & time: 12/18/22  1315     History  No chief complaint on file.   James [name]. is a 77 y.o. male.  77 year old male with prior medical history as detailed below presents for evaluation.  Patient reports longstanding history of intermittent diarrhea..  Patient reports onset yesterday of loose watery diarrhea.  He denies fever.  He denies bloody stool.  He denies abdominal pain.  He denies vomiting.  He did not take anything for his diarrhea.  The history is provided by the patient and medical records.       Home Medications Prior to Admission medications   Medication Sig Start Date End Date Taking? Authorizing Provider  acetaminophen (TYLENOL) 325 MG tablet Take 3 tablets (975 mg total) by mouth every 6 (six) hours as needed. 11/28/22   Barnetta Chapel, PA-C  amLODipine (NORVASC) 10 MG tablet Take 1 tablet (10 mg total) by mouth every morning. 09/01/22   Burnadette Pop, MD  apixaban (ELIQUIS) 5 MG TABS tablet Take 5 mg by mouth 2 (two) times daily.    [provider]  Calcium Carb-Cholecalciferol 600-10 MG-MCG TABS Take 1 tablet by mouth 2 (two) times daily. 11/28/22   [provider]  Calcium Carbonate-Vit D-Min (CALCIUM-VITAMIN D-MINERALS) 600-400 MG-UNIT CHEW Chew 1 tablet by mouth in the morning and at bedtime.    [provider]  colestipol (COLESTID) 1 g tablet Take 2 g by mouth 2 (two) times daily. Noon and bedtime 02/11/20   [provider]  Ferrous Fumarate (HEMOCYTE - 106 MG FE) 324 (106 Fe) MG TABS tablet Take 1 tablet by mouth every morning. 02/15/21   [provider]  finasteride (PROSCAR) 5 MG tablet Take 5 mg by mouth every morning.    [provider]  furosemide (LASIX) 20 MG tablet TAKE 1 TABLET BY MOUTH EVERY DAY Patient taking differently: Take 20 mg by mouth in the morning. 08/08/22   Omar Person,  MD  gabapentin (NEURONTIN) 400 MG capsule Take 400-800 mg by mouth See admin instructions. Take 400mg  by mouth at lunch time and 800 mg by mouth at bedtime. 08/26/15   [provider]  HYDROcodone-acetaminophen (NORCO) 10-325 MG tablet Take 1 tablet by mouth every 6 (six) hours as needed for moderate pain or severe pain. 09/16/16   [provider]  insulin degludec (TRESIBA FLEXTOUCH) 100 UNIT/ML FlexTouch Pen Inject 15 Units into the skin at bedtime.    [provider]  lenalidomide (REVLIMID) 15 MG capsule Take 1 capsule (15 mg total) by mouth daily. Take 1 capsule for 21 days and then off for 7 days. Discard capsules already on hand. 11/22/22   Jaci Standard, MD  lidocaine (LIDODERM) 5 % Place 1 patch onto the skin daily. Remove & Discard patch within 12 hours or as directed by MD 01/02/22   Virgina Norfolk, DO  loperamide (IMODIUM A-D) 2 MG tablet Take 1 tablet (2 mg total) by mouth 3 (three) times daily as needed for diarrhea or loose stools. Patient not taking: Reported on 11/27/2022 09/01/22   Burnadette Pop, MD  losartan (COZAAR) 50 MG tablet Take 1 tablet (50 mg total) by mouth every morning. 09/01/22   Burnadette Pop, MD  morphine (MS CONTIN) 30 MG 12 hr tablet Take 30 mg by mouth every 12 (twelve) hours. 07/20/22   [provider]  naloxone [facility]) nasal spray 4 mg/0.1  mL Place into the nose. 10/25/22   [provider]  pantoprazole (PROTONIX) 40 MG tablet Take 40 mg by mouth 2 (two) times daily. 12/14/20   [provider]  pioglitazone (ACTOS) 15 MG tablet Take 15 mg by mouth in the morning. 01/13/21   [provider]  Potassium Chloride ER 20 MEQ TBCR Take 1 tablet (20 mEq total) by mouth daily for 7 days. 12/03/22 12/10/22  Georga Kaufmann T, PA-C  RESTASIS 0.05 % ophthalmic emulsion Place 1 drop into both eyes 2 (two) times daily as needed for dry eyes. 11/27/19   [provider]  Tamsulosin HCl (FLOMAX) 0.4 MG CAPS Take 0.4 mg  by mouth daily.    [provider]  tiZANidine (ZANAFLEX) 4 MG tablet Take 1 tablet (4 mg total) by mouth every 8 (eight) hours as needed for muscle spasms. 09/01/22   Burnadette Pop, MD  TRELEGY ELLIPTA 200-62.5-25 MCG/ACT AEPB Take 1 puff by mouth 2 (two) times daily. 09/01/22   Burnadette Pop, MD  vitamin B-12 (CYANOCOBALAMIN) 500 MCG tablet Take 500 mcg by mouth every morning.    [provider]  zolpidem (AMBIEN) 5 MG tablet Take 1 tablet (5 mg total) by mouth at bedtime as needed for sleep. 09/01/22   Burnadette Pop, MD  terazosin (HYTRIN) 5 MG capsule Take 5 mg by mouth 2 (two) times daily.   10/25/11  [provider]      Allergies    Invokana [canagliflozin] and Ace inhibitors    Review of Systems   Review of Systems  All other systems reviewed and are negative.   Physical Exam Updated Vital Signs BP (!) 151/74   Pulse 83   Temp 98.6 F (37 C)   Resp (!) 24   SpO2 100%  Physical Exam Vitals and nursing note reviewed.  Constitutional:      General: He is not in acute distress.    Appearance: Normal appearance. He is well-developed.  HENT:     Head: Normocephalic and atraumatic.  Eyes:     Conjunctiva/sclera: Conjunctivae normal.     Pupils: Pupils are equal, round, and reactive to light.  Cardiovascular:     Rate and Rhythm: Normal rate and regular rhythm.     Heart sounds: Normal heart sounds.  Pulmonary:     Effort: Pulmonary effort is normal. No respiratory distress.     Breath sounds: Normal breath sounds.  Abdominal:     General: There is no distension.     Palpations: Abdomen is soft.     Tenderness: There is no abdominal tenderness.  Musculoskeletal:        General: No deformity. Normal range of motion.     Cervical back: Normal range of motion and neck supple.  Skin:    General: Skin is warm and dry.  Neurological:     General: No focal deficit present.     Mental Status: He is alert and oriented to person, place, and time.      ED Results / Procedures / Treatments   Labs (all labs ordered are listed, but only abnormal results are displayed) Labs Reviewed  COMPREHENSIVE METABOLIC PANEL - Abnormal; Notable for the following components:      Result Value   Potassium 3.4 (*)    CO2 19 (*)    Glucose, Bld 147 (*)    BUN 7 (*)    Creatinine, Ser 1.25 (*)    GFR, Estimated 59 (*)    All other components  within normal limits  CBC - Abnormal; Notable for the following components:   RDW 18.0 (*)    All other components within normal limits  URINALYSIS, ROUTINE W REFLEX MICROSCOPIC - Abnormal; Notable for the following components:   Color, Urine STRAW (*)    Hgb urine dipstick MODERATE (*)    All other components within normal limits  C DIFFICILE QUICK SCREEN W PCR REFLEX    LIPASE, BLOOD  MAGNESIUM    EKG None  Radiology CT ABDOMEN PELVIS W CONTRAST  Result Date: 12/18/2022 CLINICAL DATA:  Diarrhea. EXAM: CT ABDOMEN AND PELVIS WITH CONTRAST TECHNIQUE: Multidetector CT imaging of the abdomen and pelvis was performed using the standard protocol following bolus administration of intravenous contrast. RADIATION DOSE REDUCTION: This exam was performed according to the departmental dose-optimization program which includes automated exposure control, adjustment of the mA and/or kV according to patient size and/or use of iterative reconstruction technique. CONTRAST:  75mL OMNIPAQUE IOHEXOL 350 MG/ML SOLN COMPARISON:  November 24, 2022. FINDINGS: Lower chest: No acute abnormality. Hepatobiliary: Status post cholecystectomy. No biliary dilatation is noted. Stable hepatic cysts. Pancreas: Unremarkable. No pancreatic ductal dilatation or surrounding inflammatory changes. Spleen: Normal in size without focal abnormality. Adrenals/Urinary Tract: Adrenal glands appear normal. Stable bilateral renal cysts are noted. 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 No hydronephrosis or renal obstruction is  noted. Urinary bladder is unremarkable. Stomach/Bowel: Stomach is unremarkable. Status post appendectomy. There is no evidence of bowel obstruction or inflammation. Vascular/Lymphatic: Aortic atherosclerosis. No enlarged abdominal or pelvic lymph nodes. Reproductive: Prostate is unremarkable. Other: Small amount of free fluid is noted in the pelvis. Small fat containing periumbilical hernia. Musculoskeletal: Old L1 compression fracture is noted. No acute osseous abnormality is noted. IMPRESSION: Small amount of free fluid is noted in the pelvis of uncertain etiology. No evidence of bowel obstruction or inflammation. Aortic Atherosclerosis (ICD10-I70.0). Electronically Signed   By: Lupita Raider M.D.   On: 12/18/2022 15:20    Procedures Procedures    Medications Ordered in ED Medications  iohexol (OMNIPAQUE) 350 MG/ML injection 75 mL (75 mLs Intravenous Contrast Given 12/18/22 1509)  sodium chloride 0.9 % bolus 1,000 mL (0 mLs Intravenous Stopped 12/18/22 1913)  potassium chloride 10 mEq in 100 mL IVPB (0 mEq Intravenous Stopped 12/18/22 1913)    ED Course/ Medical Decision Making/ A&P                             Medical Decision Making Risk Prescription drug management.    Medical Screen Complete  This patient presented to the ED with complaint of diarrhea.  This complaint involves an extensive number of treatment options. The initial differential diagnosis includes, but is not limited to, metabolic abnormality, AKI, infection, etc.  This presentation is: Acute, Chronic, Self-Limited, Previously Undiagnosed, Uncertain Prognosis, Complicated, Systemic Symptoms, and Threat to Life/Bodily Function  Patient presents with complaint of diarrhea.  Patient with intermittent history of same.  Patient reports increased loose stool since yesterday.  No associated fever, abdominal pain, vomiting, hematemesis, melena reported.  Screening labs on the whole are without significant abnormality.   Patient without evidence of significant dehydration.  Potassium is mildly decreased and this is replaced.  Patient given IV fluids at a suspected mild dehydration.  CT imaging is without evidence of acute abnormality.  Patient is feeling improved after ED evaluation.  Patient offered admission for overnight IV fluids and observation.  He  declined same.  He desires to go home.  Both he and his family member at bedside understand need for close outpatient follow-up.  Strict return precautions given and understood.  Importance of close follow-up stressed.  Additional history obtained:  External records from outside sources obtained and reviewed including prior ED visits and prior Inpatient records.    Lab Tests:  I ordered and personally interpreted labs.    Imaging Studies ordered:  I ordered imaging studies including CT abdomen pelvis I independently visualized and interpreted obtained imaging which showed NAD I agree with the radiologist interpretation.   Cardiac Monitoring:  The patient was maintained on a cardiac monitor.  I personally viewed and interpreted the cardiac monitor which showed an underlying rhythm of: NSR   Medicines ordered:  I ordered medication including IV fluids, IV potassium for suspected dehydration, hypokalemia Reevaluation of the patient after these medicines showed that the patient: improved  Problem List / ED Course:  Diarrhea   Reevaluation:  After the interventions noted above, I reevaluated the patient and found that they have: improved   Disposition:  After consideration of the diagnostic results and the patients response to treatment, I feel that the patent would benefit from close outpatient follow-up.          Final Clinical Impression(s) / ED Diagnoses Final diagnoses:  Diarrhea, unspecified type    Rx / DC Orders ED Discharge Orders          Ordered    loperamide (IMODIUM) 2 MG capsule  4 times daily PRN         12/18/22 1957              Wynetta Fines, MD 12/18/22 [id number]

## 2022-12-18 NOTE — Discharge Instructions (Addendum)
Return for any problem.  ?

## 2022-12-29 ENCOUNTER — Other Ambulatory Visit: Payer: Self-pay | Admitting: Hematology and Oncology

## 2022-12-29 ENCOUNTER — Inpatient Hospital Stay: Payer: Medicare HMO

## 2022-12-29 DIAGNOSIS — C9001 Multiple myeloma in remission: Secondary | ICD-10-CM

## 2022-12-29 LAB — CMP (CANCER CENTER ONLY)
ALT: 10 U/L (ref 0–44)
AST: 14 U/L — ABNORMAL LOW (ref 15–41)
Albumin: 4 g/dL (ref 3.5–5.0)
Alkaline Phosphatase: 91 U/L (ref 38–126)
Anion gap: 9 (ref 5–15)
BUN: 11 mg/dL (ref 8–23)
CO2: 27 mmol/L (ref 22–32)
Calcium: 8.8 mg/dL — ABNORMAL LOW (ref 8.9–10.3)
Chloride: 104 mmol/L (ref 98–111)
Creatinine: 1.25 mg/dL — ABNORMAL HIGH (ref 0.61–1.24)
GFR, Estimated: 59 mL/min — ABNORMAL LOW
Glucose, Bld: 113 mg/dL — ABNORMAL HIGH (ref 70–99)
Potassium: 3.5 mmol/L (ref 3.5–5.1)
Sodium: 140 mmol/L (ref 135–145)
Total Bilirubin: 0.4 mg/dL (ref 0.3–1.2)
Total Protein: 6.6 g/dL (ref 6.5–8.1)

## 2022-12-29 LAB — CBC WITH DIFFERENTIAL (CANCER CENTER ONLY)
Abs Immature Granulocytes: 0.01 10*3/uL (ref 0.00–0.07)
Basophils Absolute: 0 10*3/uL (ref 0.0–0.1)
Basophils Relative: 1 %
Eosinophils Absolute: 0.2 10*3/uL (ref 0.0–0.5)
Eosinophils Relative: 3 %
HCT: 34 % — ABNORMAL LOW (ref 39.0–52.0)
Hemoglobin: 11.4 g/dL — ABNORMAL LOW (ref 13.0–17.0)
Immature Granulocytes: 0 %
Lymphocytes Relative: 23 %
Lymphs Abs: 1 10*3/uL (ref 0.7–4.0)
MCH: 30.2 pg (ref 26.0–34.0)
MCHC: 33.5 g/dL (ref 30.0–36.0)
MCV: 89.9 fL (ref 80.0–100.0)
Monocytes Absolute: 0.3 10*3/uL (ref 0.1–1.0)
Monocytes Relative: 6 %
Neutro Abs: 3 10*3/uL (ref 1.7–7.7)
Neutrophils Relative %: 67 %
Platelet Count: 208 10*3/uL (ref 150–400)
RBC: 3.78 MIL/uL — ABNORMAL LOW (ref 4.22–5.81)
RDW: 17.1 % — ABNORMAL HIGH (ref 11.5–15.5)
WBC Count: 4.4 10*3/uL (ref 4.0–10.5)
nRBC: 0 % (ref 0.0–0.2)

## 2022-12-29 LAB — LACTATE DEHYDROGENASE: LDH: 149 U/L (ref 98–192)

## 2023-01-01 LAB — KAPPA/LAMBDA LIGHT CHAINS
Kappa free light chain: 33.3 mg/L — ABNORMAL HIGH (ref 3.3–19.4)
Kappa, lambda light chain ratio: 1.71 — ABNORMAL HIGH (ref 0.26–1.65)
Lambda free light chains: 19.5 mg/L (ref 5.7–26.3)

## 2023-01-03 ENCOUNTER — Other Ambulatory Visit: Payer: Self-pay | Admitting: *Deleted

## 2023-01-03 MED ORDER — LENALIDOMIDE 15 MG PO CAPS: 15.0000 mg | ORAL_CAPSULE | Freq: Every day | ORAL | 0 refills | Status: DC

## 2023-01-08 LAB — MULTIPLE MYELOMA PANEL, SERUM
Albumin SerPl Elph-Mcnc: 3.5 g/dL (ref 2.9–4.4)
Albumin/Glob SerPl: 1.4 (ref 0.7–1.7)
Alpha 1: 0.3 g/dL (ref 0.0–0.4)
Alpha2 Glob SerPl Elph-Mcnc: 0.7 g/dL (ref 0.4–1.0)
B-Globulin SerPl Elph-Mcnc: 0.9 g/dL (ref 0.7–1.3)
Gamma Glob SerPl Elph-Mcnc: 0.7 g/dL (ref 0.4–1.8)
Globulin, Total: 2.6 g/dL (ref 2.2–3.9)
IgA: 172 mg/dL (ref 61–437)
IgG (Immunoglobin G), Serum: 778 mg/dL (ref 603–1613)
IgM (Immunoglobulin M), Srm: 21 mg/dL (ref 15–143)
Total Protein ELP: 6.1 g/dL (ref 6.0–8.5)

## 2023-01-26 ENCOUNTER — Inpatient Hospital Stay: Payer: Medicare HMO | Attending: Oncology

## 2023-01-30 ENCOUNTER — Other Ambulatory Visit: Payer: Self-pay | Admitting: Hematology and Oncology

## 2023-01-30 DIAGNOSIS — C9001 Multiple myeloma in remission: Secondary | ICD-10-CM

## 2023-01-31 ENCOUNTER — Other Ambulatory Visit: Payer: Self-pay | Admitting: *Deleted

## 2023-01-31 MED ORDER — LENALIDOMIDE 15 MG PO CAPS: 15.0000 mg | ORAL_CAPSULE | Freq: Every day | ORAL | 0 refills | Status: DC

## 2023-02-02 NOTE — Telephone Encounter (Signed)
Reordered on 01/31/2023

## 2023-02-23 ENCOUNTER — Inpatient Hospital Stay: Payer: Medicare HMO | Attending: Oncology

## 2023-02-23 ENCOUNTER — Inpatient Hospital Stay: Payer: Medicare HMO | Admitting: Hematology and Oncology

## 2023-02-23 ENCOUNTER — Other Ambulatory Visit: Payer: Self-pay | Admitting: Hematology and Oncology

## 2023-02-23 DIAGNOSIS — C9001 Multiple myeloma in remission: Secondary | ICD-10-CM

## 2023-02-23 NOTE — Progress Notes (Unsigned)
[facility] [phone]   [phone]  PROGRESS NOTE  Patient Care Team: Wilmer Floor, NP as PCP - General Jodelle Red, MD as PCP - Cardiology (Cardiology)  Hematological/Oncological History # IgA Kappa Multiple Myeloma in Remission 2017: diagnosed with IgA Kappa MM 01/28/2016: Revlimid at 25 mg daily for 21 days with dexamethasone at 20 mg weekly. Reached CR in Jan 2018 10/2018: relapsed disease with M spike of 3.9 g/dL IgM level of 5638.  May 2020: Revlimid 15 mg daily for 21 days out of a 28 day cycle with dexamethasone 40 mg weekly  May 2021: achieved a complete response to therapy. Started maintenance Revlimid 15 mg daily restarted in June 2021 with dexamethasone 4 mg weekly  09/07/2022: transition care to Dr. Leonides Schanz   Interval History:  James [name]. 77 y.o. male with medical history significant for IgA Kappa MM who presents for a follow up visit. The patient's last visit was on 12/01/2022. In the interim since the last visit he ***  On exam today James [name] reports ***  He otherwise denies any fevers, chills, sweats, nausea, vomiting or diarrhea.  He denies any chest pain or shortness of breath.  A full 10 point ROS was otherwise negative.  MEDICAL HISTORY:  Past Medical History:  Diagnosis Date   Anemia    Arthritis    BPH (benign prostatic hyperplasia)    CHF (congestive heart failure) (HCC)    Chronic pain    Diabetes mellitus    Dyspnea    GERD (gastroesophageal reflux disease)    Headache(784.0)    migraines, sinus headaches   Hepatitis    C and B-treated   HLD (hyperlipidemia)    Hypertension    Leukemia (HCC)    Lupus (HCC)    Multiple myeloma (HCC)    Pneumonia 07/2011   Sleep apnea 03/2018   going for fitting on 04-29-18   Thyroid disease    goiter   Urinary frequency     SURGICAL HISTORY: Past Surgical History:  Procedure Laterality Date   ABDOMINAL SURGERY     BACK SURGERY     x 5    BIOPSY   01/02/2020   Procedure: BIOPSY;  Surgeon: Jeani Hawking, MD;  Location: [facility];  Service: Endoscopy;;   CHOLECYSTECTOMY     COLONOSCOPY     COLONOSCOPY WITH PROPOFOL N/A 01/02/2020   Procedure: COLONOSCOPY WITH PROPOFOL;  Surgeon: Jeani Hawking, MD;  Location: [facility];  Service: Endoscopy;  Laterality: N/A;   cyst removal skull  20 years ago   ETHMOIDECTOMY  10/12/2011   Procedure: ETHMOIDECTOMY;  Surgeon: Keturah Barre, MD;  Location: [facility] OR;  Service: ENT;  Laterality: Bilateral;  bilateral maxillary sinus osteal enlargement, frontal sinusotomy   EYE SURGERY     bilat cataract with lens implants   HAND SURGERY     right finger   HERNIA REPAIR     LAPAROSCOPIC APPENDECTOMY N/A 11/26/2022   Procedure: APPENDECTOMY LAPAROSCOPIC;  Surgeon: Emelia Loron, MD;  Location: [facility];  Service: General;  Laterality: N/A;   MICROLARYNGOSCOPY N/A 02/18/2021   Procedure: MICROLARYNGOSCOPY with Excisional Biopsy;  Surgeon: Osborn Coho, MD;  Location: [facility] OR;  Service: ENT;  Laterality: N/A;   POLYPECTOMY  01/02/2020   Procedure: POLYPECTOMY;  Surgeon: Jeani Hawking, MD;  Location: [facility];  Service: Endoscopy;;   ROTATOR CUFF REPAIR     bilateral   SHOULDER ARTHROSCOPY WITH ROTATOR CUFF REPAIR Left 05/18/2014   Procedure: SHOULDER ARTHROSCOPY  WITH ROTATOR CUFF REPAIR;  Surgeon: Mable Paris, MD;  Location: [facility];  Service: Orthopedics;  Laterality: Left;  Left shoulder arthroscopy rotator cuff repair, subacromial decompression   sinus surgery     TONSILLECTOMY     TRIGGER FINGER RELEASE Right 05/02/2018   Procedure: RELEASE TRIGGER FINGER/A-1 PULLEY RIGHT SMALL FINGER;  Surgeon: Betha Loa, MD;  Location: [facility];  Service: Orthopedics;  Laterality: Right;    SOCIAL HISTORY: Social History   Socioeconomic History   Marital status: Married    Spouse name: Not on file   Number of children: Not on file   Years of education: Not  on file   Highest education level: Not on file  Occupational History   Not on file  Tobacco Use   Smoking status: Former    Current packs/day: 0.00    Average packs/day: 1 pack/day for 26.0 years (26.0 ttl pk-yrs)    Types: Cigarettes    Start date: 05/12/1958    Quit date: 05/12/1984    Years since quitting: 38.8   Smokeless tobacco: Never  Vaping Use   Vaping status: Not on file  Substance and Sexual Activity   Alcohol use: No   Drug use: Not Currently    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 years ago   Sexual activity: Not Currently  Other Topics Concern   Not on file  Social History Narrative   Not on file   Social Determinants of Health   Financial Resource Strain: Not on file  Food Insecurity: No Food Insecurity (11/24/2022)   Hunger Vital Sign    Worried About Running Out of Food in the Last Year: Never true    Ran Out of Food in the Last Year: Never true  Transportation Needs: No Transportation Needs (11/24/2022)   PRAPARE - Administrator, Civil Service (Medical): No    Lack of Transportation (Non-Medical): No  Physical Activity: Not on file  Stress: Not on file  Social Connections: Not on file  Intimate Partner Violence: Not At Risk (11/24/2022)   Humiliation, Afraid, Rape, and Kick questionnaire    Fear of Current or Ex-Partner: No    Emotionally Abused: No    Physically Abused: No    Sexually Abused: No    FAMILY HISTORY: Family History  Problem Relation Age of Onset   Hyperlipidemia Mother    Hypertension Mother     ALLERGIES:  is allergic to invokana [canagliflozin] and ace inhibitors.  MEDICATIONS:  Current Outpatient Medications  Medication Sig Dispense Refill   acetaminophen (TYLENOL) 325 MG tablet Take 3 tablets (975 mg total) by mouth every 6 (six) hours as needed.     amLODipine (NORVASC) 10 MG tablet Take 1 tablet (10 mg total) by mouth every morning. 30 tablet 0   apixaban (ELIQUIS) 5 MG TABS tablet Take 5 mg by mouth 2 (two)  times daily.     Calcium Carb-Cholecalciferol 600-10 MG-MCG TABS Take 1 tablet by mouth 2 (two) times daily.     Calcium Carbonate-Vit D-Min (CALCIUM-VITAMIN D-MINERALS) 600-400 MG-UNIT CHEW Chew 1 tablet by mouth in the morning and at bedtime.     colestipol (COLESTID) 1 g tablet Take 2 g by mouth 2 (two) times daily. Noon and bedtime     Ferrous Fumarate (HEMOCYTE - 106 MG FE) 324 (106 Fe) MG TABS tablet Take 1 tablet by mouth every morning.     finasteride (PROSCAR) 5 MG tablet Take 5 mg  by mouth every morning.     furosemide (LASIX) 20 MG tablet TAKE 1 TABLET BY MOUTH EVERY DAY (Patient taking differently: Take 20 mg by mouth in the morning.) 90 tablet 3   gabapentin (NEURONTIN) 400 MG capsule Take 400-800 mg by mouth See admin instructions. Take 400mg  by mouth at lunch time and 800 mg by mouth at bedtime.     HYDROcodone-acetaminophen (NORCO) 10-325 MG tablet Take 1 tablet by mouth every 6 (six) hours as needed for moderate pain or severe pain.     insulin degludec (TRESIBA FLEXTOUCH) 100 UNIT/ML FlexTouch Pen Inject 15 Units into the skin at bedtime.     lenalidomide (REVLIMID) 15 MG capsule Take 1 capsule (15 mg total) by mouth daily. Take 1 capsule for 21 days and then off for 7 days. 21 capsule 0   lidocaine (LIDODERM) 5 % Place 1 patch onto the skin daily. Remove & Discard patch within 12 hours or as directed by MD 30 patch 0   loperamide (IMODIUM A-D) 2 MG tablet Take 1 tablet (2 mg total) by mouth 3 (three) times daily as needed for diarrhea or loose stools. (Patient not taking: Reported on 11/27/2022) 20 tablet 0   loperamide (IMODIUM) 2 MG capsule Take 1 capsule (2 mg total) by mouth 4 (four) times daily as needed for diarrhea or loose stools. 12 capsule 0   losartan (COZAAR) 50 MG tablet Take 1 tablet (50 mg total) by mouth every morning. 30 tablet 0   morphine (MS CONTIN) 30 MG 12 hr tablet Take 30 mg by mouth every 12 (twelve) hours.     naloxone (NARCAN) nasal spray 4 mg/0.1 mL Place  into the nose.     pantoprazole (PROTONIX) 40 MG tablet Take 40 mg by mouth 2 (two) times daily.     pioglitazone (ACTOS) 15 MG tablet Take 15 mg by mouth in the morning.     Potassium Chloride ER 20 MEQ TBCR Take 1 tablet (20 mEq total) by mouth daily for 7 days. 7 tablet 0   RESTASIS 0.05 % ophthalmic emulsion Place 1 drop into both eyes 2 (two) times daily as needed for dry eyes.     Tamsulosin HCl (FLOMAX) 0.4 MG CAPS Take 0.4 mg by mouth daily.     tiZANidine (ZANAFLEX) 4 MG tablet Take 1 tablet (4 mg total) by mouth every 8 (eight) hours as needed for muscle spasms. 30 tablet 0   TRELEGY ELLIPTA 200-62.5-25 MCG/ACT AEPB Take 1 puff by mouth 2 (two) times daily. 60 each 0   vitamin B-12 (CYANOCOBALAMIN) 500 MCG tablet Take 500 mcg by mouth every morning.     zolpidem (AMBIEN) 5 MG tablet Take 1 tablet (5 mg total) by mouth at bedtime as needed for sleep. 5 tablet 0   No current facility-administered medications for this visit.    REVIEW OF SYSTEMS:   Constitutional: ( - ) fevers, ( - )  chills , ( - ) night sweats Eyes: ( - ) blurriness of vision, ( - ) double vision, ( - ) watery eyes Ears, nose, mouth, throat, and face: ( - ) mucositis, ( - ) sore throat Respiratory: ( - ) cough, ( - ) dyspnea, ( - ) wheezes Cardiovascular: ( - ) palpitation, ( - ) chest discomfort, ( - ) lower extremity swelling Gastrointestinal:  ( - ) nausea, ( - ) heartburn, ( - ) change in bowel habits Skin: ( - ) abnormal skin rashes Lymphatics: ( - ) new  lymphadenopathy, ( - ) easy bruising Neurological: ( - ) numbness, ( - ) tingling, ( - ) new weaknesses Behavioral/Psych: ( - ) mood change, ( - ) new changes  All other systems were reviewed with the patient and are negative.  PHYSICAL EXAMINATION: ECOG PERFORMANCE STATUS: 1 - Symptomatic but completely ambulatory  There were no vitals filed for this visit.  There were no vitals filed for this visit.   GENERAL: alert, no distress and  comfortable SKIN: skin color, texture, turgor are normal, no rashes or significant lesions EYES: conjunctiva are pink and non-injected, sclera clear OROPHARYNX: no exudate, no erythema; lips, buccal mucosa, and tongue normal  NECK: supple, non-tender LYMPH:  no palpable lymphadenopathy in the cervical, axillary or inguinal LUNGS: clear to auscultation and percussion with normal breathing effort HEART: regular rate & rhythm and no murmurs and no lower extremity edema ABDOMEN: soft, non-tender, non-distended, normal bowel sounds Musculoskeletal: no cyanosis of digits and no clubbing  PSYCH: alert & oriented x 3, fluent speech NEURO: no focal motor/sensory deficits  LABORATORY DATA:  I have reviewed the data as listed    Latest Ref Rng & Units 12/29/2022    3:26 PM 12/18/2022    1:46 PM 12/01/2022   12:50 PM  CBC  WBC 4.0 - 10.5 K/uL 4.4  7.0  4.8   Hemoglobin 13.0 - 17.0 g/dL 16.1  09.6  04.5   Hematocrit 39.0 - 52.0 % 34.0  41.9  32.0   Platelets 150 - 400 K/uL 208  225  196        Latest Ref Rng & Units 12/29/2022    3:26 PM 12/18/2022    1:46 PM 12/01/2022   12:50 PM  CMP  Glucose 70 - 99 mg/dL 409  811  914   BUN 8 - 23 mg/dL 11  7  16    Creatinine 0.61 - 1.24 mg/dL 7.82  9.56  2.13   Sodium 135 - 145 mmol/L 140  138  139   Potassium 3.5 - 5.1 mmol/L 3.5  3.4  3.2   Chloride 98 - 111 mmol/L 104  107  105   CO2 22 - 32 mmol/L 27  19  26    Calcium 8.9 - 10.3 mg/dL 8.8  9.1  8.6   Total Protein 6.5 - 8.1 g/dL 6.6  7.0  6.5   Total Bilirubin 0.3 - 1.2 mg/dL 0.4  0.7  0.6   Alkaline Phos 38 - 126 U/L 91  95  66   AST 15 - 41 U/L 14  19  13    ALT 0 - 44 U/L 10  13  13      Lab Results  Component Value Date   MPROTEIN Not Observed 12/29/2022   MPROTEIN Not Observed 12/01/2022   MPROTEIN Not Observed 11/03/2022   Lab Results  Component Value Date   KPAFRELGTCHN 33.3 (H) 12/29/2022   KPAFRELGTCHN 43.6 (H) 12/01/2022   KPAFRELGTCHN 45.0 (H) 11/03/2022   LAMBDASER 19.5  12/29/2022   LAMBDASER 20.5 12/01/2022   LAMBDASER 21.9 11/03/2022   KAPLAMBRATIO 1.71 (H) 12/29/2022   KAPLAMBRATIO 2.13 (H) 12/01/2022   KAPLAMBRATIO 2.05 (H) 11/03/2022    RADIOGRAPHIC STUDIES: No results found.  ASSESSMENT & PLAN James [name]. 77 y.o. male with medical history significant for IgA Kappa MM who presents for a follow up visit.  # IgA Kappa Multiple Myeloma in Remission -- Recommend restarting Revlimid 15 mg p.o. daily 21 days on and 7 days off. --  Last labs show that M protein is undetectable and serum free light chains are within normal limits -- Labs today show white blood cell count ***.  Creatinine is *** with normal LFTs. -- Patient is currently on Eliquis therapy which should prevent against future VTE while on Revlimid.  Okay to restart the Revlimid in the setting of VTE. -- Recommend labs every 4 weeks with return to clinic in 3 months time.  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 -- After 6 months can consider decreasing down to Eliquis 2.5 mg twice daily.  Would recommend continuation of this as long as the patient is on Revlimid therapy.  No orders of the defined types were placed in this encounter.   All questions were answered. The patient knows to call the clinic with any problems, questions or concerns.  A total of more than 30 minutes were spent on this encounter with face-to-face time and non-face-to-face time, including preparing to see the patient, ordering tests and/or medications, counseling the patient and coordination of care as outlined above.   Ulysees Barns, MD Department of Hematology/Oncology [facility] Phone: [phone] Pager: [phone] Email: [email or web address]  02/23/2023 7:24 AM

## 2023-02-26 ENCOUNTER — Telehealth: Payer: Self-pay | Admitting: Hematology and Oncology

## 2023-02-26 ENCOUNTER — Other Ambulatory Visit: Payer: Self-pay | Admitting: Hematology and Oncology

## 2023-02-26 DIAGNOSIS — C9001 Multiple myeloma in remission: Secondary | ICD-10-CM

## 2023-02-26 NOTE — Telephone Encounter (Signed)
Patient is aware of upcoming appointment times/dates with provider

## 2023-03-01 ENCOUNTER — Telehealth: Payer: Self-pay | Admitting: Hematology and Oncology

## 2023-03-05 ENCOUNTER — Inpatient Hospital Stay: Payer: Medicare HMO | Admitting: Hematology and Oncology

## 2023-03-05 ENCOUNTER — Inpatient Hospital Stay: Payer: Medicare HMO | Attending: Oncology

## 2023-03-05 ENCOUNTER — Other Ambulatory Visit: Payer: Self-pay

## 2023-03-05 VITALS — BP 122/59 | HR 67 | Temp 98.1°F | Resp 13 | Wt 191.2 lb

## 2023-03-05 DIAGNOSIS — I2699 Other pulmonary embolism without acute cor pulmonale: Secondary | ICD-10-CM | POA: Insufficient documentation

## 2023-03-05 DIAGNOSIS — Z87891 Personal history of nicotine dependence: Secondary | ICD-10-CM | POA: Insufficient documentation

## 2023-03-05 DIAGNOSIS — Z7901 Long term (current) use of anticoagulants: Secondary | ICD-10-CM | POA: Diagnosis not present

## 2023-03-05 DIAGNOSIS — C9001 Multiple myeloma in remission: Secondary | ICD-10-CM | POA: Insufficient documentation

## 2023-03-05 LAB — CMP (CANCER CENTER ONLY)
ALT: 11 U/L (ref 0–44)
AST: 13 U/L — ABNORMAL LOW (ref 15–41)
Albumin: 4.1 g/dL (ref 3.5–5.0)
Alkaline Phosphatase: 96 U/L (ref 38–126)
Anion gap: 5 (ref 5–15)
BUN: 11 mg/dL (ref 8–23)
CO2: 28 mmol/L (ref 22–32)
Calcium: 8.8 mg/dL — ABNORMAL LOW (ref 8.9–10.3)
Chloride: 105 mmol/L (ref 98–111)
Creatinine: 1.35 mg/dL — ABNORMAL HIGH (ref 0.61–1.24)
GFR, Estimated: 53 mL/min — ABNORMAL LOW (ref >60)
Glucose, Bld: 104 mg/dL — ABNORMAL HIGH (ref 70–99)
Potassium: 3.7 mmol/L (ref 3.5–5.1)
Sodium: 138 mmol/L (ref 135–145)
Total Bilirubin: 0.6 mg/dL (ref 0.3–1.2)
Total Protein: 6.7 g/dL (ref 6.5–8.1)

## 2023-03-05 LAB — CBC WITH DIFFERENTIAL (CANCER CENTER ONLY)
Abs Immature Granulocytes: 0.01 10*3/uL (ref 0.00–0.07)
Basophils Absolute: 0 10*3/uL (ref 0.0–0.1)
Basophils Relative: 1 %
Eosinophils Absolute: 0.3 10*3/uL (ref 0.0–0.5)
Eosinophils Relative: 7 %
HCT: 36.8 % — ABNORMAL LOW (ref 39.0–52.0)
Hemoglobin: 12.2 g/dL — ABNORMAL LOW (ref 13.0–17.0)
Immature Granulocytes: 0 %
Lymphocytes Relative: 25 %
Lymphs Abs: 1 10*3/uL (ref 0.7–4.0)
MCH: 29.8 pg (ref 26.0–34.0)
MCHC: 33.2 g/dL (ref 30.0–36.0)
MCV: 89.8 fL (ref 80.0–100.0)
Monocytes Absolute: 0.4 10*3/uL (ref 0.1–1.0)
Monocytes Relative: 11 %
Neutro Abs: 2.2 10*3/uL (ref 1.7–7.7)
Neutrophils Relative %: 56 %
Platelet Count: 150 10*3/uL (ref 150–400)
RBC: 4.1 MIL/uL — ABNORMAL LOW (ref 4.22–5.81)
RDW: 16.4 % — ABNORMAL HIGH (ref 11.5–15.5)
WBC Count: 3.9 10*3/uL — ABNORMAL LOW (ref 4.0–10.5)
nRBC: 0 % (ref 0.0–0.2)

## 2023-03-05 LAB — LACTATE DEHYDROGENASE: LDH: 124 U/L (ref 98–192)

## 2023-03-05 NOTE — Progress Notes (Signed)
[facility] [phone]   [phone]  PROGRESS NOTE  Patient Care Team: Wilmer Floor, NP as PCP - General Jodelle Red, MD as PCP - Cardiology (Cardiology)  Hematological/Oncological History # IgA Kappa Multiple Myeloma in Remission 2017: diagnosed with IgA Kappa MM 01/28/2016: Revlimid at 25 mg daily for 21 days with dexamethasone at 20 mg weekly. Reached CR in Jan 2018 10/2018: relapsed disease with M spike of 3.9 g/dL IgM level of 9528.  May 2020: Revlimid 15 mg daily for 21 days out of a 28 day cycle with dexamethasone 40 mg weekly  May 2021: achieved a complete response to therapy. Started maintenance Revlimid 15 mg daily restarted in June 2021 with dexamethasone 4 mg weekly  09/07/2022: transition care to Dr. Leonides Schanz   Interval History:  James [name]. 77 y.o. male with medical history significant for IgA Kappa MM who presents for a follow up visit. The patient's last visit was on 09/07/2022. In the interim since the last visit he was admitted for pulmonary embolism and his Revlimid was held.  He was subsequently started on Eliquis therapy.  On exam today James [name] reports he has been going through a lot of changes in interim since her last visit.  He reports that his bowels have been running back and forth between loose and constipated.  He has that he is tolerating his p.o. Revlimid well without any major side effects.  He reports his energy is okay and he does have bouts of being weak and tired.  His appetite has been good.  He notes he is faithfully taking his Eliquis medication without any bleeding, bruising, or dark stools.  Otherwise he has no questions concerns or complaints today.  He otherwise denies any fevers, chills, sweats, nausea, vomiting or diarrhea.  He denies any chest pain or shortness of breath.  A full 10 point ROS was otherwise negative.  MEDICAL HISTORY:  Past Medical History:  Diagnosis Date   Anemia     Arthritis    BPH (benign prostatic hyperplasia)    CHF (congestive heart failure) (HCC)    Chronic pain    Diabetes mellitus    Dyspnea    GERD (gastroesophageal reflux disease)    Headache(784.0)    migraines, sinus headaches   Hepatitis    C and B-treated   HLD (hyperlipidemia)    Hypertension    Leukemia (HCC)    Lupus (HCC)    Multiple myeloma (HCC)    Pneumonia 07/2011   Sleep apnea 03/2018   going for fitting on 04-29-18   Thyroid disease    goiter   Urinary frequency     SURGICAL HISTORY: Past Surgical History:  Procedure Laterality Date   ABDOMINAL SURGERY     BACK SURGERY     x 5    BIOPSY  01/02/2020   Procedure: BIOPSY;  Surgeon: Jeani Hawking, MD;  Location: [facility];  Service: Endoscopy;;   CHOLECYSTECTOMY     COLONOSCOPY     COLONOSCOPY WITH PROPOFOL N/A 01/02/2020   Procedure: COLONOSCOPY WITH PROPOFOL;  Surgeon: Jeani Hawking, MD;  Location: [facility];  Service: Endoscopy;  Laterality: N/A;   cyst removal skull  20 years ago   ETHMOIDECTOMY  10/12/2011   Procedure: ETHMOIDECTOMY;  Surgeon: Keturah Barre, MD;  Location[facility] OR;  Service: ENT;  Laterality: Bilateral;  bilateral maxillary sinus osteal enlargement, frontal sinusotomy   EYE SURGERY     bilat cataract with lens implants  HAND SURGERY     right finger   HERNIA REPAIR     LAPAROSCOPIC APPENDECTOMY N/A 11/26/2022   Procedure: APPENDECTOMY LAPAROSCOPIC;  Surgeon: Emelia Loron, MD;  Location: [facility];  Service: General;  Laterality: N/A;   MICROLARYNGOSCOPY N/A 02/18/2021   Procedure: MICROLARYNGOSCOPY with Excisional Biopsy;  Surgeon: Osborn Coho, MD;  Location: [facility] OR;  Service: ENT;  Laterality: N/A;   POLYPECTOMY  01/02/2020   Procedure: POLYPECTOMY;  Surgeon: Jeani Hawking, MD;  Location: [facility];  Service: Endoscopy;;   ROTATOR CUFF REPAIR     bilateral   SHOULDER ARTHROSCOPY WITH ROTATOR CUFF REPAIR Left 05/18/2014   Procedure: SHOULDER ARTHROSCOPY WITH ROTATOR CUFF  REPAIR;  Surgeon: Mable Paris, MD;  Location: [facility];  Service: Orthopedics;  Laterality: Left;  Left shoulder arthroscopy rotator cuff repair, subacromial decompression   sinus surgery     TONSILLECTOMY     TRIGGER FINGER RELEASE Right 05/02/2018   Procedure: RELEASE TRIGGER FINGER/A-1 PULLEY RIGHT SMALL FINGER;  Surgeon: Betha Loa, MD;  Location: [facility];  Service: Orthopedics;  Laterality: Right;    SOCIAL HISTORY: Social History   Socioeconomic History   Marital status: Married    Spouse name: Not on file   Number of children: Not on file   Years of education: Not on file   Highest education level: Not on file  Occupational History   Not on file  Tobacco Use   Smoking status: Former    Current packs/day: 0.00    Average packs/day: 1 pack/day for 26.0 years (26.0 ttl pk-yrs)    Types: Cigarettes    Start date: 05/12/1958    Quit date: 05/12/1984    Years since quitting: 38.8   Smokeless tobacco: Never  Vaping Use   Vaping status: Not on file  Substance and Sexual Activity   Alcohol use: No   Drug use: Not Currently    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 years ago   Sexual activity: Not Currently  Other Topics Concern   Not on file  Social History Narrative   Not on file   Social Determinants of Health   Financial Resource Strain: Not on file  Food Insecurity: No Food Insecurity (11/24/2022)   Hunger Vital Sign    Worried About Running Out of Food in the Last Year: Never true    Ran Out of Food in the Last Year: Never true  Transportation Needs: No Transportation Needs (11/24/2022)   PRAPARE - Administrator, Civil Service (Medical): No    Lack of Transportation (Non-Medical): No  Physical Activity: Not on file  Stress: Not on file  Social Connections: Not on file  Intimate Partner Violence: Not At Risk (11/24/2022)   Humiliation, Afraid, Rape, and Kick questionnaire    Fear of Current or Ex-Partner: No     Emotionally Abused: No    Physically Abused: No    Sexually Abused: No    FAMILY HISTORY: Family History  Problem Relation Age of Onset   Hyperlipidemia Mother    Hypertension Mother     ALLERGIES:  is allergic to invokana [canagliflozin] and ace inhibitors.  MEDICATIONS:  Current Outpatient Medications  Medication Sig Dispense Refill   acetaminophen (TYLENOL) 325 MG tablet Take 3 tablets (975 mg total) by mouth every 6 (six) hours as needed.     amLODipine (NORVASC) 10 MG tablet Take 1 tablet (10 mg total) by mouth every morning. 30 tablet 0  apixaban (ELIQUIS) 5 MG TABS tablet Take 5 mg by mouth 2 (two) times daily.     Calcium Carb-Cholecalciferol 600-10 MG-MCG TABS Take 1 tablet by mouth 2 (two) times daily.     Calcium Carbonate-Vit D-Min (CALCIUM-VITAMIN D-MINERALS) 600-400 MG-UNIT CHEW Chew 1 tablet by mouth in the morning and at bedtime.     colestipol (COLESTID) 1 g tablet Take 2 g by mouth 2 (two) times daily. Noon and bedtime     Ferrous Fumarate (HEMOCYTE - 106 MG FE) 324 (106 Fe) MG TABS tablet Take 1 tablet by mouth every morning.     finasteride (PROSCAR) 5 MG tablet Take 5 mg by mouth every morning.     furosemide (LASIX) 20 MG tablet TAKE 1 TABLET BY MOUTH EVERY DAY (Patient taking differently: Take 20 mg by mouth in the morning.) 90 tablet 3   gabapentin (NEURONTIN) 400 MG capsule Take 400-800 mg by mouth See admin instructions. Take 400mg  by mouth at lunch time and 800 mg by mouth at bedtime.     HYDROcodone-acetaminophen (NORCO) 10-325 MG tablet Take 1 tablet by mouth every 6 (six) hours as needed for moderate pain or severe pain.     insulin degludec (TRESIBA FLEXTOUCH) 100 UNIT/ML FlexTouch Pen Inject 15 Units into the skin at bedtime.     lenalidomide (REVLIMID) 15 MG capsule TAKE 1 CAPSULE BY MOUTH DAILY FOR 21 DAYS, THEN OFF FOR 7 DAYS 21 capsule 0   lidocaine (LIDODERM) 5 % Place 1 patch onto the skin daily. Remove & Discard patch within 12 hours or as  directed by MD 30 patch 0   loperamide (IMODIUM A-D) 2 MG tablet Take 1 tablet (2 mg total) by mouth 3 (three) times daily as needed for diarrhea or loose stools. (Patient not taking: Reported on 11/27/2022) 20 tablet 0   loperamide (IMODIUM) 2 MG capsule Take 1 capsule (2 mg total) by mouth 4 (four) times daily as needed for diarrhea or loose stools. 12 capsule 0   losartan (COZAAR) 50 MG tablet Take 1 tablet (50 mg total) by mouth every morning. 30 tablet 0   morphine (MS CONTIN) 30 MG 12 hr tablet Take 30 mg by mouth every 12 (twelve) hours.     naloxone (NARCAN) nasal spray 4 mg/0.1 mL Place into the nose.     pantoprazole (PROTONIX) 40 MG tablet Take 40 mg by mouth 2 (two) times daily.     pioglitazone (ACTOS) 15 MG tablet Take 15 mg by mouth in the morning.     Potassium Chloride ER 20 MEQ TBCR Take 1 tablet (20 mEq total) by mouth daily for 7 days. 7 tablet 0   RESTASIS 0.05 % ophthalmic emulsion Place 1 drop into both eyes 2 (two) times daily as needed for dry eyes.     Tamsulosin HCl (FLOMAX) 0.4 MG CAPS Take 0.4 mg by mouth daily.     tiZANidine (ZANAFLEX) 4 MG tablet Take 1 tablet (4 mg total) by mouth every 8 (eight) hours as needed for muscle spasms. 30 tablet 0   TRELEGY ELLIPTA 200-62.5-25 MCG/ACT AEPB Take 1 puff by mouth 2 (two) times daily. 60 each 0   vitamin B-12 (CYANOCOBALAMIN) 500 MCG tablet Take 500 mcg by mouth every morning.     zolpidem (AMBIEN) 5 MG tablet Take 1 tablet (5 mg total) by mouth at bedtime as needed for sleep. 5 tablet 0   No current facility-administered medications for this visit.    REVIEW OF SYSTEMS:  Constitutional: ( - ) fevers, ( - )  chills , ( - ) night sweats Eyes: ( - ) blurriness of vision, ( - ) double vision, ( - ) watery eyes Ears, nose, mouth, throat, and face: ( - ) mucositis, ( - ) sore throat Respiratory: ( - ) cough, ( - ) dyspnea, ( - ) wheezes Cardiovascular: ( - ) palpitation, ( - ) chest discomfort, ( - ) lower extremity  swelling Gastrointestinal:  ( - ) nausea, ( - ) heartburn, ( - ) change in bowel habits Skin: ( - ) abnormal skin rashes Lymphatics: ( - ) new lymphadenopathy, ( - ) easy bruising Neurological: ( - ) numbness, ( - ) tingling, ( - ) new weaknesses Behavioral/Psych: ( - ) mood change, ( - ) new changes  All other systems were reviewed with the patient and are negative.  PHYSICAL EXAMINATION: ECOG PERFORMANCE STATUS: 1 - Symptomatic but completely ambulatory  Vitals:   03/05/23 1101  BP: (!) 122/59  Pulse: 67  Resp: 13  Temp: 98.1 F (36.7 C)  SpO2: 100%    Filed Weights   03/05/23 1101  Weight: 191 lb 3.2 oz (86.7 kg)     GENERAL: alert, no distress and comfortable SKIN: skin color, texture, turgor are normal, no rashes or significant lesions EYES: conjunctiva are pink and non-injected, sclera clear OROPHARYNX: no exudate, no erythema; lips, buccal mucosa, and tongue normal  NECK: supple, non-tender LYMPH:  no palpable lymphadenopathy in the cervical, axillary or inguinal LUNGS: clear to auscultation and percussion with normal breathing effort HEART: regular rate & rhythm and no murmurs and no lower extremity edema ABDOMEN: soft, non-tender, non-distended, normal bowel sounds Musculoskeletal: no cyanosis of digits and no clubbing  PSYCH: alert & oriented x 3, fluent speech NEURO: no focal motor/sensory deficits  LABORATORY DATA:  I have reviewed the data as listed    Latest Ref Rng & Units 03/05/2023   10:41 AM 12/29/2022    3:26 PM 12/18/2022    1:46 PM  CBC  WBC 4.0 - 10.5 K/uL 3.9  4.4  7.0   Hemoglobin 13.0 - 17.0 g/dL 16.1  09.6  04.5   Hematocrit 39.0 - 52.0 % 36.8  34.0  41.9   Platelets 150 - 400 K/uL 150  208  225        Latest Ref Rng & Units 03/05/2023   10:41 AM 12/29/2022    3:26 PM 12/18/2022    1:46 PM  CMP  Glucose 70 - 99 mg/dL 409  811  914   BUN 8 - 23 mg/dL 11  11  7    Creatinine 0.61 - 1.24 mg/dL 7.82  9.56  2.13   Sodium 135 - 145 mmol/L 138   140  138   Potassium 3.5 - 5.1 mmol/L 3.7  3.5  3.4   Chloride 98 - 111 mmol/L 105  104  107   CO2 22 - 32 mmol/L 28  27  19    Calcium 8.9 - 10.3 mg/dL 8.8  8.8  9.1   Total Protein 6.5 - 8.1 g/dL 6.7  6.6  7.0   Total Bilirubin 0.3 - 1.2 mg/dL 0.6  0.4  0.7   Alkaline Phos 38 - 126 U/L 96  91  95   AST 15 - 41 U/L 13  14  19    ALT 0 - 44 U/L 11  10  13      Lab Results  Component Value Date   MPROTEIN  Not Observed 12/29/2022   MPROTEIN Not Observed 12/01/2022   MPROTEIN Not Observed 11/03/2022   Lab Results  Component Value Date   KPAFRELGTCHN 33.3 (H) 12/29/2022   KPAFRELGTCHN 43.6 (H) 12/01/2022   KPAFRELGTCHN 45.0 (H) 11/03/2022   LAMBDASER 19.5 12/29/2022   LAMBDASER 20.5 12/01/2022   LAMBDASER 21.9 11/03/2022   KAPLAMBRATIO 1.71 (H) 12/29/2022   KAPLAMBRATIO 2.13 (H) 12/01/2022   KAPLAMBRATIO 2.05 (H) 11/03/2022    RADIOGRAPHIC STUDIES: No results found.  ASSESSMENT & PLAN James [name]. 77 y.o. male with medical history significant for IgA Kappa MM who presents for a follow up visit.  # IgA Kappa Multiple Myeloma in Remission -- Recommend restarting Revlimid 15 mg p.o. daily 21 days on and 7 days off. -- Last labs show that M protein is undetectable and serum free light chains are within normal limits -- Labs today show white blood cell count 3.9, hemoglobin 12.2, MCV 89.8, and platelets of 150.  Creatinine is 1.35 with normal LFTs. -- Patient is currently on Eliquis therapy which should prevent against future VTE while on Revlimid.  Okay to restart the Revlimid in the setting of VTE. -- Recommend labs every 4 weeks with return to clinic in 3 months time.  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 -- After 6 months can consider decreasing down to Eliquis 2.5 mg twice daily.  Would recommend continuation of this as long as the patient is on Revlimid therapy.  No orders of the defined types were placed  in this encounter.   All questions were answered. The patient knows to call the clinic with any problems, questions or concerns.  A total of more than 30 minutes were spent on this encounter with face-to-face time and non-face-to-face time, including preparing to see the patient, ordering tests and/or medications, counseling the patient and coordination of care as outlined above.   James Barns, MD Department of Hematology/Oncology [facility] Phone: [phone] Pager: [phone] Email: [email or web address]  03/05/2023 4:23 PM

## 2023-03-28 ENCOUNTER — Other Ambulatory Visit: Payer: Self-pay | Admitting: Hematology and Oncology

## 2023-03-28 DIAGNOSIS — C9001 Multiple myeloma in remission: Secondary | ICD-10-CM

## 2023-03-29 ENCOUNTER — Other Ambulatory Visit: Payer: Self-pay | Admitting: *Deleted

## 2023-03-29 DIAGNOSIS — C9001 Multiple myeloma in remission: Secondary | ICD-10-CM

## 2023-03-29 MED ORDER — LENALIDOMIDE 15 MG PO CAPS
15.0000 mg | ORAL_CAPSULE | Freq: Every day | ORAL | 0 refills | Status: DC
Start: 2023-03-29 — End: 2023-04-26

## 2023-04-03 ENCOUNTER — Other Ambulatory Visit: Payer: Self-pay | Admitting: Physician Assistant

## 2023-04-05 ENCOUNTER — Ambulatory Visit: Payer: Medicare HMO | Admitting: Hematology and Oncology

## 2023-04-05 ENCOUNTER — Other Ambulatory Visit: Payer: Self-pay | Admitting: Hematology and Oncology

## 2023-04-05 ENCOUNTER — Inpatient Hospital Stay: Payer: Medicare HMO | Attending: Hematology and Oncology

## 2023-04-05 ENCOUNTER — Other Ambulatory Visit: Payer: Medicare HMO

## 2023-04-05 DIAGNOSIS — C9001 Multiple myeloma in remission: Secondary | ICD-10-CM

## 2023-04-05 LAB — CMP (CANCER CENTER ONLY)
ALT: 17 U/L (ref 0–44)
AST: 18 U/L (ref 15–41)
Albumin: 3.9 g/dL (ref 3.5–5.0)
Alkaline Phosphatase: 95 U/L (ref 38–126)
Anion gap: 8 (ref 5–15)
BUN: 13 mg/dL (ref 8–23)
CO2: 26 mmol/L (ref 22–32)
Calcium: 9 mg/dL (ref 8.9–10.3)
Chloride: 108 mmol/L (ref 98–111)
Creatinine: 1.36 mg/dL — ABNORMAL HIGH (ref 0.61–1.24)
GFR, Estimated: 53 mL/min — ABNORMAL LOW (ref >60)
Glucose, Bld: 115 mg/dL — ABNORMAL HIGH (ref 70–99)
Potassium: 3.6 mmol/L (ref 3.5–5.1)
Sodium: 142 mmol/L (ref 135–145)
Total Bilirubin: 0.6 mg/dL (ref 0.3–1.2)
Total Protein: 6.7 g/dL (ref 6.5–8.1)

## 2023-04-05 LAB — CBC WITH DIFFERENTIAL (CANCER CENTER ONLY)
Abs Immature Granulocytes: 0.01 10*3/uL (ref 0.00–0.07)
Basophils Absolute: 0 10*3/uL (ref 0.0–0.1)
Basophils Relative: 1 %
Eosinophils Absolute: 0.4 10*3/uL (ref 0.0–0.5)
Eosinophils Relative: 9 %
HCT: 37.3 % — ABNORMAL LOW (ref 39.0–52.0)
Hemoglobin: 12.4 g/dL — ABNORMAL LOW (ref 13.0–17.0)
Immature Granulocytes: 0 %
Lymphocytes Relative: 24 %
Lymphs Abs: 1 10*3/uL (ref 0.7–4.0)
MCH: 30.2 pg (ref 26.0–34.0)
MCHC: 33.2 g/dL (ref 30.0–36.0)
MCV: 91 fL (ref 80.0–100.0)
Monocytes Absolute: 0.6 10*3/uL (ref 0.1–1.0)
Monocytes Relative: 14 %
Neutro Abs: 2.2 10*3/uL (ref 1.7–7.7)
Neutrophils Relative %: 52 %
Platelet Count: 174 10*3/uL (ref 150–400)
RBC: 4.1 MIL/uL — ABNORMAL LOW (ref 4.22–5.81)
RDW: 16.5 % — ABNORMAL HIGH (ref 11.5–15.5)
WBC Count: 4.3 10*3/uL (ref 4.0–10.5)
nRBC: 0 % (ref 0.0–0.2)

## 2023-04-05 LAB — LACTATE DEHYDROGENASE: LDH: 149 U/L (ref 98–192)

## 2023-04-06 ENCOUNTER — Inpatient Hospital Stay: Payer: Medicare HMO | Attending: Oncology

## 2023-04-06 LAB — KAPPA/LAMBDA LIGHT CHAINS
Kappa free light chain: 47.5 mg/L — ABNORMAL HIGH (ref 3.3–19.4)
Kappa, lambda light chain ratio: 1.94 — ABNORMAL HIGH (ref 0.26–1.65)
Lambda free light chains: 24.5 mg/L (ref 5.7–26.3)

## 2023-04-09 LAB — MULTIPLE MYELOMA PANEL, SERUM
Albumin SerPl Elph-Mcnc: 3.2 g/dL (ref 2.9–4.4)
Albumin/Glob SerPl: 1.1 (ref 0.7–1.7)
Alpha 1: 0.3 g/dL (ref 0.0–0.4)
Alpha2 Glob SerPl Elph-Mcnc: 0.8 g/dL (ref 0.4–1.0)
B-Globulin SerPl Elph-Mcnc: 1 g/dL (ref 0.7–1.3)
Gamma Glob SerPl Elph-Mcnc: 0.9 g/dL (ref 0.4–1.8)
Globulin, Total: 3 g/dL (ref 2.2–3.9)
IgA: 208 mg/dL (ref 61–437)
IgG (Immunoglobin G), Serum: 907 mg/dL (ref 603–1613)
IgM (Immunoglobulin M), Srm: 15 mg/dL (ref 15–143)
Total Protein ELP: 6.2 g/dL (ref 6.0–8.5)

## 2023-04-25 ENCOUNTER — Other Ambulatory Visit: Payer: Self-pay | Admitting: Hematology and Oncology

## 2023-04-25 DIAGNOSIS — C9001 Multiple myeloma in remission: Secondary | ICD-10-CM

## 2023-04-26 ENCOUNTER — Other Ambulatory Visit: Payer: Self-pay

## 2023-04-26 DIAGNOSIS — C9001 Multiple myeloma in remission: Secondary | ICD-10-CM

## 2023-04-26 MED ORDER — LENALIDOMIDE 15 MG PO CAPS
15.0000 mg | ORAL_CAPSULE | Freq: Every day | ORAL | 0 refills | Status: DC
Start: 2023-04-26 — End: 2023-05-23

## 2023-05-01 DIAGNOSIS — S52091A Other fracture of upper end of right ulna, initial encounter for closed fracture: Secondary | ICD-10-CM | POA: Diagnosis not present

## 2023-05-02 DIAGNOSIS — E1142 Type 2 diabetes mellitus with diabetic polyneuropathy: Secondary | ICD-10-CM | POA: Diagnosis not present

## 2023-05-04 ENCOUNTER — Inpatient Hospital Stay: Payer: Medicare HMO | Attending: Hematology and Oncology

## 2023-05-04 DIAGNOSIS — Z7961 Long term (current) use of immunomodulator: Secondary | ICD-10-CM | POA: Insufficient documentation

## 2023-05-04 DIAGNOSIS — Z79899 Other long term (current) drug therapy: Secondary | ICD-10-CM | POA: Insufficient documentation

## 2023-05-04 DIAGNOSIS — C9001 Multiple myeloma in remission: Secondary | ICD-10-CM | POA: Insufficient documentation

## 2023-05-04 DIAGNOSIS — Z7901 Long term (current) use of anticoagulants: Secondary | ICD-10-CM | POA: Insufficient documentation

## 2023-05-14 ENCOUNTER — Ambulatory Visit (INDEPENDENT_AMBULATORY_CARE_PROVIDER_SITE_OTHER): Payer: Medicare HMO

## 2023-05-17 DIAGNOSIS — M5416 Radiculopathy, lumbar region: Secondary | ICD-10-CM | POA: Diagnosis not present

## 2023-05-17 DIAGNOSIS — M961 Postlaminectomy syndrome, not elsewhere classified: Secondary | ICD-10-CM | POA: Diagnosis not present

## 2023-05-17 DIAGNOSIS — G8929 Other chronic pain: Secondary | ICD-10-CM | POA: Diagnosis not present

## 2023-05-17 DIAGNOSIS — M5451 Vertebrogenic low back pain: Secondary | ICD-10-CM | POA: Diagnosis not present

## 2023-05-18 ENCOUNTER — Other Ambulatory Visit: Payer: Self-pay | Admitting: Physical Medicine & Rehabilitation

## 2023-05-18 DIAGNOSIS — S8264XA Nondisplaced fracture of lateral malleolus of right fibula, initial encounter for closed fracture: Secondary | ICD-10-CM | POA: Diagnosis not present

## 2023-05-18 DIAGNOSIS — S82401A Unspecified fracture of shaft of right fibula, initial encounter for closed fracture: Secondary | ICD-10-CM | POA: Diagnosis not present

## 2023-05-18 DIAGNOSIS — M25571 Pain in right ankle and joints of right foot: Secondary | ICD-10-CM | POA: Diagnosis not present

## 2023-05-18 DIAGNOSIS — S8251XA Displaced fracture of medial malleolus of right tibia, initial encounter for closed fracture: Secondary | ICD-10-CM | POA: Diagnosis not present

## 2023-05-18 DIAGNOSIS — M5416 Radiculopathy, lumbar region: Secondary | ICD-10-CM

## 2023-05-22 ENCOUNTER — Other Ambulatory Visit: Payer: Self-pay | Admitting: Hematology and Oncology

## 2023-05-22 DIAGNOSIS — C9001 Multiple myeloma in remission: Secondary | ICD-10-CM

## 2023-05-23 ENCOUNTER — Other Ambulatory Visit: Payer: Self-pay | Admitting: *Deleted

## 2023-05-23 DIAGNOSIS — C9001 Multiple myeloma in remission: Secondary | ICD-10-CM

## 2023-05-23 MED ORDER — LENALIDOMIDE 15 MG PO CAPS
15.0000 mg | ORAL_CAPSULE | Freq: Every day | ORAL | 0 refills | Status: DC
Start: 2023-05-23 — End: 2023-07-02

## 2023-05-25 ENCOUNTER — Other Ambulatory Visit: Payer: Self-pay | Admitting: Hematology and Oncology

## 2023-05-25 ENCOUNTER — Inpatient Hospital Stay: Payer: Medicare HMO

## 2023-05-25 ENCOUNTER — Other Ambulatory Visit: Payer: Self-pay

## 2023-05-25 ENCOUNTER — Inpatient Hospital Stay (HOSPITAL_BASED_OUTPATIENT_CLINIC_OR_DEPARTMENT_OTHER): Payer: Medicare HMO | Admitting: Nurse Practitioner

## 2023-05-25 ENCOUNTER — Inpatient Hospital Stay: Payer: Medicare HMO | Admitting: Physician Assistant

## 2023-05-25 ENCOUNTER — Encounter: Payer: Self-pay | Admitting: Nurse Practitioner

## 2023-05-25 VITALS — BP 140/65 | HR 72 | Temp 99.8°F | Resp 17 | Ht 72.0 in | Wt 195.2 lb

## 2023-05-25 DIAGNOSIS — S8264XA Nondisplaced fracture of lateral malleolus of right fibula, initial encounter for closed fracture: Secondary | ICD-10-CM | POA: Diagnosis not present

## 2023-05-25 DIAGNOSIS — Z79899 Other long term (current) drug therapy: Secondary | ICD-10-CM | POA: Diagnosis not present

## 2023-05-25 DIAGNOSIS — C9001 Multiple myeloma in remission: Secondary | ICD-10-CM | POA: Diagnosis not present

## 2023-05-25 DIAGNOSIS — Z7961 Long term (current) use of immunomodulator: Secondary | ICD-10-CM | POA: Diagnosis not present

## 2023-05-25 DIAGNOSIS — Z7901 Long term (current) use of anticoagulants: Secondary | ICD-10-CM | POA: Diagnosis not present

## 2023-05-25 LAB — CBC WITH DIFFERENTIAL (CANCER CENTER ONLY)
Abs Immature Granulocytes: 0.03 10*3/uL (ref 0.00–0.07)
Basophils Absolute: 0.1 10*3/uL (ref 0.0–0.1)
Basophils Relative: 1 %
Eosinophils Absolute: 0.1 10*3/uL (ref 0.0–0.5)
Eosinophils Relative: 3 %
HCT: 36.6 % — ABNORMAL LOW (ref 39.0–52.0)
Hemoglobin: 12.4 g/dL — ABNORMAL LOW (ref 13.0–17.0)
Immature Granulocytes: 1 %
Lymphocytes Relative: 19 %
Lymphs Abs: 1 10*3/uL (ref 0.7–4.0)
MCH: 31 pg (ref 26.0–34.0)
MCHC: 33.9 g/dL (ref 30.0–36.0)
MCV: 91.5 fL (ref 80.0–100.0)
Monocytes Absolute: 0.3 10*3/uL (ref 0.1–1.0)
Monocytes Relative: 5 %
Neutro Abs: 4 10*3/uL (ref 1.7–7.7)
Neutrophils Relative %: 71 %
Platelet Count: 215 10*3/uL (ref 150–400)
RBC: 4 MIL/uL — ABNORMAL LOW (ref 4.22–5.81)
RDW: 15.6 % — ABNORMAL HIGH (ref 11.5–15.5)
WBC Count: 5.5 10*3/uL (ref 4.0–10.5)
nRBC: 0 % (ref 0.0–0.2)

## 2023-05-25 LAB — CMP (CANCER CENTER ONLY)
ALT: 16 U/L (ref 0–44)
AST: 23 U/L (ref 15–41)
Albumin: 3.9 g/dL (ref 3.5–5.0)
Alkaline Phosphatase: 105 U/L (ref 38–126)
Anion gap: 7 (ref 5–15)
BUN: 14 mg/dL (ref 8–23)
CO2: 26 mmol/L (ref 22–32)
Calcium: 8.7 mg/dL — ABNORMAL LOW (ref 8.9–10.3)
Chloride: 105 mmol/L (ref 98–111)
Creatinine: 1.36 mg/dL — ABNORMAL HIGH (ref 0.61–1.24)
GFR, Estimated: 53 mL/min — ABNORMAL LOW (ref >60)
Glucose, Bld: 165 mg/dL — ABNORMAL HIGH (ref 70–99)
Potassium: 4 mmol/L (ref 3.5–5.1)
Sodium: 138 mmol/L (ref 135–145)
Total Bilirubin: 0.4 mg/dL (ref 0.3–1.2)
Total Protein: 6.8 g/dL (ref 6.5–8.1)

## 2023-05-25 LAB — LACTATE DEHYDROGENASE: LDH: 158 U/L (ref 98–192)

## 2023-05-25 NOTE — Assessment & Plan Note (Addendum)
Restarted Revlimid 15 mg p.o. daily 21 days on and 7 days off on 03/05/2023 -- Last labs show that M protein is undetectable and serum free light chains are within normal limits -- Labs today show white blood cell count 3.9, hemoglobin 12.2, MCV 89.8, and platelets of 150.  Creatinine is 1.35 with normal LFTs. -- Patient is currently on Eliquis therapy which should prevent against future VTE while on Revlimid.  Okay to restart the Revlimid in the setting of VTE. -- Recommend labs every 4 weeks with return to clinic in 3 months time.  -- continue Eliquis 5 mg twice daily. -- After 6 months can consider decreasing down to Eliquis 2.5 mg twice daily.  Would recommend continuation of this as long as the patient is on Revlimid therapy.

## 2023-05-25 NOTE — Progress Notes (Signed)
Patient Care Team: Wilmer Floor, NP as PCP - Kandice Robinsons, MD as PCP - Cardiology (Cardiology)  Clinic Day:  05/25/2023  Referring physician: Wilmer Floor, NP  ASSESSMENT & PLAN:   Assessment & Plan: Multiple myeloma (HCC) Restarted Revlimid 15 mg p.o. daily 21 days on and 7 days off on 03/05/2023 -- Last labs show that M protein is undetectable and serum free light chains are within normal limits -- Labs today show white blood cell count 3.9, hemoglobin 12.2, MCV 89.8, and platelets of 150.  Creatinine is 1.35 with normal LFTs. -- Patient is currently on Eliquis therapy which should prevent against future VTE while on Revlimid.  Okay to restart the Revlimid in the setting of VTE. -- Recommend labs every 4 weeks with return to clinic in 3 months time.  -- continue Eliquis 5 mg twice daily. -- After 6 months can consider decreasing down to Eliquis 2.5 mg twice daily.  Would recommend continuation of this as long as the patient is on Revlimid therapy.     Plan: Labs reviewed  -CBC showing WBC 5.5; Hgb 12.4; Hct 36.6; Plt 215; Anc 4.0 -CMP - K 4.0; glucose 165; BUN 14; Creatinine 1.36; eGFR 53; Ca 8.7; LFTs normal.   -LDH normal at 158 -multiple myeloma and light chain panels are pending -scheduled for MRI of lumbar spine on 06/13/2023 per pain management provider.  -continue revlimid as prescribed and eliquis twice daily -continue with lab checks every month -provider follow up in 3 months, as scheduled.  -patient understands that he will be contacted with pending lab results when they are available.   The patient understands the plans discussed today and is in agreement with them.  He knows to contact our office if he develops concerns prior to his next appointment.  I provided 25 minutes of face-to-face time during this encounter and > 50% was spent counseling as documented under my assessment and plan.    Carlean Jews, NP  [facility]  [facility] [address] [place] [address] Dept: [phone] Dept Fax: [phone]   No orders of the defined types were placed in this encounter.     CHIEF COMPLAINT:  CC: multiple myeloma in remission   Current Treatment:  Revlimid 15 mg daily and Eliquis 5 mg twice daily   INTERVAL HISTORY:  James [name] is here today for repeat clinical assessment. Last seen by Dr. Leonides Schanz 03/05/2023. Had been hospitalized after developing a PE. Suspect root cause was Revlimid. Continues Eliquis. Was started back on Revlimid at 15 mg daily for 21 days on and 7 days off. He denies fevers or chills. He does have chronic back pain for which he sees pain management provider. He is scheduled to have MRI of the lumbar spine next month for further evaluation.  His appetite is good. His weight has increased 4 pounds over last 2 months .  I have reviewed the past medical history, past surgical history, social history and family history with the patient and they are unchanged from previous note.  ALLERGIES:  is allergic to invokana [canagliflozin] and ace inhibitors.  MEDICATIONS:  Current Outpatient Medications  Medication Sig Dispense Refill   acetaminophen (TYLENOL) 325 MG tablet Take 3 tablets (975 mg total) by mouth every 6 (six) hours as needed.     amLODipine (NORVASC) 10 MG tablet Take 1 tablet (10 mg total) by mouth every morning. 30 tablet 0   apixaban (ELIQUIS) 5  MG TABS tablet Take 5 mg by mouth 2 (two) times daily.     Calcium Carb-Cholecalciferol 600-10 MG-MCG TABS Take 1 tablet by mouth 2 (two) times daily.     Calcium Carbonate-Vit D-Min (CALCIUM-VITAMIN D-MINERALS) 600-400 MG-UNIT CHEW Chew 1 tablet by mouth in the morning and at bedtime.     colestipol (COLESTID) 1 g tablet Take 2 g by mouth 2 (two) times daily. Noon and bedtime     Ferrous Fumarate (HEMOCYTE - 106 MG FE) 324 (106 Fe) MG TABS tablet Take 1 tablet by mouth every morning.      finasteride (PROSCAR) 5 MG tablet Take 5 mg by mouth every morning.     furosemide (LASIX) 20 MG tablet TAKE 1 TABLET BY MOUTH EVERY DAY (Patient taking differently: Take 20 mg by mouth in the morning.) 90 tablet 3   gabapentin (NEURONTIN) 400 MG capsule Take 400-800 mg by mouth See admin instructions. Take 400mg  by mouth at lunch time and 800 mg by mouth at bedtime.     HYDROcodone-acetaminophen (NORCO) 10-325 MG tablet Take 1 tablet by mouth every 6 (six) hours as needed for moderate pain or severe pain.     insulin degludec (TRESIBA FLEXTOUCH) 100 UNIT/ML FlexTouch Pen Inject 15 Units into the skin at bedtime.     lenalidomide (REVLIMID) 15 MG capsule Take 1 capsule (15 mg total) by mouth daily. Siri Cole #  [id number]   Date Obtained 05/23/23  Take 1 capsule daily for 21 days. None for the next 7 days 21 capsule 0   lidocaine (LIDODERM) 5 % Place 1 patch onto the skin daily. Remove & Discard patch within 12 hours or as directed by MD 30 patch 0   loperamide (IMODIUM A-D) 2 MG tablet Take 1 tablet (2 mg total) by mouth 3 (three) times daily as needed for diarrhea or loose stools. 20 tablet 0   loperamide (IMODIUM) 2 MG capsule Take 1 capsule (2 mg total) by mouth 4 (four) times daily as needed for diarrhea or loose stools. 12 capsule 0   losartan (COZAAR) 50 MG tablet Take 1 tablet (50 mg total) by mouth every morning. 30 tablet 0   morphine (MS CONTIN) 30 MG 12 hr tablet Take 30 mg by mouth every 12 (twelve) hours.     naloxone (NARCAN) nasal spray 4 mg/0.1 mL Place into the nose.     pantoprazole (PROTONIX) 40 MG tablet Take 40 mg by mouth 2 (two) times daily.     pioglitazone (ACTOS) 15 MG tablet Take 15 mg by mouth in the morning.     RESTASIS 0.05 % ophthalmic emulsion Place 1 drop into both eyes 2 (two) times daily as needed for dry eyes.     Tamsulosin HCl (FLOMAX) 0.4 MG CAPS Take 0.4 mg by mouth daily.     tiZANidine (ZANAFLEX) 4 MG tablet Take 1 tablet (4 mg total) by mouth every 8  (eight) hours as needed for muscle spasms. 30 tablet 0   TRELEGY ELLIPTA 200-62.5-25 MCG/ACT AEPB Take 1 puff by mouth 2 (two) times daily. 60 each 0   vitamin B-12 (CYANOCOBALAMIN) 500 MCG tablet Take 500 mcg by mouth every morning.     zolpidem (AMBIEN) 5 MG tablet Take 1 tablet (5 mg total) by mouth at bedtime as needed for sleep. 5 tablet 0   Potassium Chloride ER 20 MEQ TBCR Take 1 tablet (20 mEq total) by mouth daily for 7 days. 7 tablet 0   No current facility-administered medications  for this visit.    HISTORY OF PRESENT ILLNESS:   Hematology/Oncology History IgA Kappa Multiple Myeloma in Remission 2017: diagnosed with IgA Kappa MM 01/28/2016: Revlimid at 25 mg daily for 21 days with dexamethasone at 20 mg weekly. Reached CR in Jan 2018 10/2018: relapsed disease with M spike of 3.9 g/dL IgM level of 8119.  May 2020: Revlimid 15 mg daily for 21 days out of a 28 day cycle with dexamethasone 40 mg weekly  May 2021: achieved a complete response to therapy. Started maintenance Revlimid 15 mg daily restarted in June 2021 with dexamethasone 4 mg weekly   REVIEW OF SYSTEMS:   Constitutional: Denies fevers, chills or abnormal weight loss Eyes: Denies blurriness of vision Ears, nose, mouth, throat, and face: Denies mucositis or sore throat Respiratory: Denies cough, dyspnea or wheezes Cardiovascular: Denies palpitation, chest discomfort or lower extremity swelling Gastrointestinal:  Denies nausea, heartburn or change in bowel habits. Reports abdominal tenderness and intermittent problems with bowels  Skin: Denies abnormal skin rashes Lymphatics: Denies new lymphadenopathy or easy bruising Neurological:Denies numbness, tingling or new weaknesses Behavioral/Psych: Mood is stable, no new changes  Musculoskeletal: moderate to severe back pain, managed by pain management provider.  All other systems were reviewed with the patient and are negative.   VITALS:   Today's Vitals   05/25/23  1432 05/25/23 1436 05/25/23 1440  BP: (!) 141/70 (!) 140/65   Pulse: 72    Resp: 17    Temp: 99.8 F (37.7 C)    TempSrc: Oral    SpO2: 100%    Weight: 195 lb 3.2 oz (88.5 kg)    Height: 6' (1.829 m)    PainSc:   6    Body mass index is 26.47 kg/m.    Wt Readings from Last 3 Encounters:  05/25/23 195 lb 3.2 oz (88.5 kg)  03/05/23 191 lb 3.2 oz (86.7 kg)  12/01/22 188 lb 9.6 oz (85.5 kg)    Body mass index is 26.47 kg/m.  Performance status (ECOG): 1 - Symptomatic but completely ambulatory  PHYSICAL EXAM:   GENERAL:alert, no distress and comfortable SKIN: skin color, texture, turgor are normal, no rashes or significant lesions EYES: normal, Conjunctiva are pink and non-injected, sclera clear OROPHARYNX:no exudate, no erythema and lips, buccal mucosa, and tongue normal  NECK: supple, thyroid normal size, non-tender, without nodularity LYMPH:  no palpable lymphadenopathy in the cervical, axillary or inguinal LUNGS: clear to auscultation and percussion with normal breathing effort HEART: regular rate & rhythm and no murmurs and no lower extremity edema ABDOMEN:abdomen soft, non-tender and normal bowel sounds Musculoskeletal:no cyanosis of digits and no clubbing. Pain in lower back with standing and changes of position. No palpable abnormalities or deformities noted. Uses a walking stick for mobility assistance.  NEURO: alert & oriented x 3 with fluent speech, no focal motor/sensory deficits  LABORATORY DATA:  I have reviewed the data as listed    Component Value Date/Time   NA 138 05/25/2023 1409   NA 138 06/26/2017 0946   K 4.0 05/25/2023 1409   K 3.8 06/26/2017 0946   CL 105 05/25/2023 1409   CL 105 08/09/2012 0918   CO2 26 05/25/2023 1409   CO2 25 06/26/2017 0946   GLUCOSE 165 (H) 05/25/2023 1409   GLUCOSE 279 (H) 06/26/2017 0946   GLUCOSE 178 (H) 08/09/2012 0918   BUN 14 05/25/2023 1409   BUN 16.4 06/26/2017 0946   CREATININE 1.36 (H) 05/25/2023 1409    CREATININE 1.2 06/26/2017 0946  CALCIUM 8.7 (L) 05/25/2023 1409   CALCIUM 9.3 06/26/2017 0946   PROT 6.8 05/25/2023 1409   PROT 7.5 06/26/2017 0946   PROT 8.1 06/26/2017 0946   ALBUMIN 3.9 05/25/2023 1409   ALBUMIN 3.6 06/26/2017 0946   AST 23 05/25/2023 1409   AST 11 06/26/2017 0946   ALT 16 05/25/2023 1409   ALT 14 06/26/2017 0946   ALKPHOS 105 05/25/2023 1409   ALKPHOS 75 06/26/2017 0946   BILITOT 0.4 05/25/2023 1409   BILITOT 0.31 06/26/2017 0946   GFRNONAA 53 (L) 05/25/2023 1409   GFRAA >60 02/26/2020 1119     Lab Results  Component Value Date   WBC 5.5 05/25/2023   NEUTROABS 4.0 05/25/2023   HGB 12.4 (L) 05/25/2023   HCT 36.6 (L) 05/25/2023   MCV 91.5 05/25/2023   PLT 215 05/25/2023

## 2023-05-28 LAB — KAPPA/LAMBDA LIGHT CHAINS
Kappa free light chain: 41.1 mg/L — ABNORMAL HIGH (ref 3.3–19.4)
Kappa, lambda light chain ratio: 1.89 — ABNORMAL HIGH (ref 0.26–1.65)
Lambda free light chains: 21.8 mg/L (ref 5.7–26.3)

## 2023-05-29 ENCOUNTER — Encounter: Payer: Self-pay | Admitting: Pulmonary Disease

## 2023-05-29 ENCOUNTER — Ambulatory Visit: Payer: Medicare HMO | Admitting: Pulmonary Disease

## 2023-05-29 ENCOUNTER — Ambulatory Visit (INDEPENDENT_AMBULATORY_CARE_PROVIDER_SITE_OTHER): Payer: Medicare HMO | Admitting: Pulmonary Disease

## 2023-05-29 ENCOUNTER — Other Ambulatory Visit: Payer: Self-pay | Admitting: *Deleted

## 2023-05-29 VITALS — BP 123/49 | HR 74 | Ht 68.0 in | Wt 194.0 lb

## 2023-05-29 DIAGNOSIS — R0602 Shortness of breath: Secondary | ICD-10-CM | POA: Diagnosis not present

## 2023-05-29 DIAGNOSIS — J449 Chronic obstructive pulmonary disease, unspecified: Secondary | ICD-10-CM | POA: Diagnosis not present

## 2023-05-29 DIAGNOSIS — R0609 Other forms of dyspnea: Secondary | ICD-10-CM

## 2023-05-29 NOTE — Patient Instructions (Signed)
Full PFT performed today, 

## 2023-05-29 NOTE — Progress Notes (Signed)
Order to get take              James [name]    [id number]    07/28/1946  Primary Care Physician:[name], Adella Hare, NP  Referring Physician: Wilmer Floor, NP [address] [place],  [place] [address]  Chief complaint:   Patient being followed for shortness of breath  HPI:  Saw Dr. Thora Lance about April Has been doing relatively well with his shortness of breath  On Revlimid for multiple myeloma Eliquis for pulmonary embolism  Has a history of chronic obstructive pulmonary disease states he is Breathing is stable, feels Trelegy continues to help Quit smoking in 1985 Papilloma excision from his larynx July 2022  Breathing has been stable he does have some dyspnea with activity but able to tolerate activity Feels Trelegy is helping  He had a home sleep test March 2023 with mild OSA with an AHI of 12 Outpatient Encounter Medications as of 05/29/2023  Medication Sig   acetaminophen (TYLENOL) 325 MG tablet Take 3 tablets (975 mg total) by mouth every 6 (six) hours as needed.   amLODipine (NORVASC) 10 MG tablet Take 1 tablet (10 mg total) by mouth every morning.   apixaban (ELIQUIS) 5 MG TABS tablet Take 5 mg by mouth 2 (two) times daily.   Calcium Carb-Cholecalciferol 600-10 MG-MCG TABS Take 1 tablet by mouth 2 (two) times daily.   Calcium Carbonate-Vit D-Min (CALCIUM-VITAMIN D-MINERALS) 600-400 MG-UNIT CHEW Chew 1 tablet by mouth in the morning and at bedtime.   colestipol (COLESTID) 1 g tablet Take 2 g by mouth 2 (two) times daily. Noon and bedtime   Ferrous Fumarate (HEMOCYTE - 106 MG FE) 324 (106 Fe) MG TABS tablet Take 1 tablet by mouth every morning.   finasteride (PROSCAR) 5 MG tablet Take 5 mg by mouth every morning.   furosemide (LASIX) 20 MG tablet TAKE 1 TABLET BY MOUTH EVERY DAY (Patient taking differently: Take 20 mg by mouth in the morning.)   gabapentin (NEURONTIN) 400 MG capsule Take 400-800 mg by mouth See admin instructions. Take 400mg  by mouth at lunch  time and 800 mg by mouth at bedtime.   HYDROcodone-acetaminophen (NORCO) 10-325 MG tablet Take 1 tablet by mouth every 6 (six) hours as needed for moderate pain or severe pain.   insulin degludec (TRESIBA FLEXTOUCH) 100 UNIT/ML FlexTouch Pen Inject 15 Units into the skin at bedtime.   lenalidomide (REVLIMID) 15 MG capsule Take 1 capsule (15 mg total) by mouth daily. Siri Cole #  [id number]   Date Obtained 05/23/23  Take 1 capsule daily for 21 days. None for the next 7 days   lidocaine (LIDODERM) 5 % Place 1 patch onto the skin daily. Remove & Discard patch within 12 hours or as directed by MD   loperamide (IMODIUM A-D) 2 MG tablet Take 1 tablet (2 mg total) by mouth 3 (three) times daily as needed for diarrhea or loose stools.   loperamide (IMODIUM) 2 MG capsule Take 1 capsule (2 mg total) by mouth 4 (four) times daily as needed for diarrhea or loose stools.   losartan (COZAAR) 50 MG tablet Take 1 tablet (50 mg total) by mouth every morning.   morphine (MS CONTIN) 30 MG 12 hr tablet Take 30 mg by mouth every 12 (twelve) hours.   naloxone (NARCAN) nasal spray 4 mg/0.1 mL Place into the nose.   pantoprazole (PROTONIX) 40 MG tablet Take 40 mg by mouth 2 (two) times daily.   pioglitazone (ACTOS) 15  MG tablet Take 15 mg by mouth in the morning.   RESTASIS 0.05 % ophthalmic emulsion Place 1 drop into both eyes 2 (two) times daily as needed for dry eyes.   Tamsulosin HCl (FLOMAX) 0.4 MG CAPS Take 0.4 mg by mouth daily.   tiZANidine (ZANAFLEX) 4 MG tablet Take 1 tablet (4 mg total) by mouth every 8 (eight) hours as needed for muscle spasms.   TRELEGY ELLIPTA 200-62.5-25 MCG/ACT AEPB Take 1 puff by mouth 2 (two) times daily. (Patient taking differently: Take 1 puff by mouth daily.)   vitamin B-12 (CYANOCOBALAMIN) 500 MCG tablet Take 500 mcg by mouth every morning.   zolpidem (AMBIEN) 5 MG tablet Take 1 tablet (5 mg total) by mouth at bedtime as needed for sleep.   Potassium Chloride ER 20 MEQ TBCR Take 1  tablet (20 mEq total) by mouth daily for 7 days.   [DISCONTINUED] terazosin (HYTRIN) 5 MG capsule Take 5 mg by mouth 2 (two) times daily.    No facility-administered encounter medications on file as of 05/29/2023.    Allergies as of 05/29/2023 - Review Complete 05/29/2023  Allergen Reaction Noted   Invokana [canagliflozin] Anaphylaxis 08/29/2014   Ace inhibitors Cough 12/14/2016    Past Medical History:  Diagnosis Date   Anemia    Arthritis    BPH (benign prostatic hyperplasia)    CHF (congestive heart failure) (HCC)    Chronic pain    Diabetes mellitus    Dyspnea    GERD (gastroesophageal reflux disease)    Headache(784.0)    migraines, sinus headaches   Hepatitis    C and B-treated   HLD (hyperlipidemia)    Hypertension    Leukemia (HCC)    Lupus    Multiple myeloma (HCC)    Pneumonia 07/2011   Sleep apnea 03/2018   going for fitting on 04-29-18   Thyroid disease    goiter   Urinary frequency     Past Surgical History:  Procedure Laterality Date   ABDOMINAL SURGERY     BACK SURGERY     x 5    BIOPSY  01/02/2020   Procedure: BIOPSY;  Surgeon: Jeani Hawking, MD;  Location: [facility];  Service: Endoscopy;;   CHOLECYSTECTOMY     COLONOSCOPY     COLONOSCOPY WITH PROPOFOL N/A 01/02/2020   Procedure: COLONOSCOPY WITH PROPOFOL;  Surgeon: Jeani Hawking, MD;  Location: [facility];  Service: Endoscopy;  Laterality: N/A;   cyst removal skull  20 years ago   ETHMOIDECTOMY  10/12/2011   Procedure: ETHMOIDECTOMY;  Surgeon: Keturah Barre, MD;  [facility];  Service: ENT;  Laterality: Bilateral;  bilateral maxillary sinus osteal enlargement, frontal sinusotomy   EYE SURGERY     bilat cataract with lens implants   HAND SURGERY     right finger   HERNIA REPAIR     LAPAROSCOPIC APPENDECTOMY N/A 11/26/2022   Procedure: APPENDECTOMY LAPAROSCOPIC;  Surgeon: Emelia Loron, MD;  Location: [facility];  Service: General;  Laterality: N/A;   MICROLARYNGOSCOPY N/A 02/18/2021    Procedure: MICROLARYNGOSCOPY with Excisional Biopsy;  Surgeon: Osborn Coho, MD;  Location[facility] OR;  Service: ENT;  Laterality: N/A;   POLYPECTOMY  01/02/2020   Procedure: POLYPECTOMY;  Surgeon: Jeani Hawking, MD;  Location: [facility];  Service: Endoscopy;;   ROTATOR CUFF REPAIR     bilateral   SHOULDER ARTHROSCOPY WITH ROTATOR CUFF REPAIR Left 05/18/2014   Procedure: SHOULDER ARTHROSCOPY WITH ROTATOR CUFF REPAIR;  Surgeon: Mable Paris, MD;  Location: [facility];  Service: Orthopedics;  Laterality: Left;  Left shoulder arthroscopy rotator cuff repair, subacromial decompression   sinus surgery     TONSILLECTOMY     TRIGGER FINGER RELEASE Right 05/02/2018   Procedure: RELEASE TRIGGER FINGER/A-1 PULLEY RIGHT SMALL FINGER;  Surgeon: Betha Loa, MD;  Location: [facility];  Service: Orthopedics;  Laterality: Right;    Family History  Problem Relation Age of Onset   Hyperlipidemia Mother    Hypertension Mother     Social History   Socioeconomic History   Marital status: Married    Spouse name: Not on file   Number of children: Not on file   Years of education: Not on file   Highest education level: Not on file  Occupational History   Not on file  Tobacco Use   Smoking status: Former    Current packs/day: 0.00    Average packs/day: 1 pack/day for 26.0 years (26.0 ttl pk-yrs)    Types: Cigarettes    Start date: 05/12/1958    Quit date: 05/12/1984    Years since quitting: 39.0   Smokeless tobacco: Never  Vaping Use   Vaping status: Not on file  Substance and Sexual Activity   Alcohol use: No   Drug use: Not Currently    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 years ago   Sexual activity: Not Currently  Other Topics Concern   Not on file  Social History Narrative   Not on file   Social Determinants of Health   Financial Resource Strain: Not on file  Food Insecurity: No Food Insecurity (11/24/2022)   Hunger Vital Sign    Worried  About Running Out of Food in the Last Year: Never true    Ran Out of Food in the Last Year: Never true  Transportation Needs: No Transportation Needs (11/24/2022)   PRAPARE - Administrator, Civil Service (Medical): No    Lack of Transportation (Non-Medical): No  Physical Activity: Not on file  Stress: Not on file  Social Connections: Not on file  Intimate Partner Violence: Not At Risk (11/24/2022)   Humiliation, Afraid, Rape, and Kick questionnaire    Fear of Current or Ex-Partner: No    Emotionally Abused: No    Physically Abused: No    Sexually Abused: No    Review of Systems  Respiratory:  Positive for apnea and shortness of breath.   Psychiatric/Behavioral:  Positive for sleep disturbance.     Vitals:   05/29/23 1556  BP: (!) 123/49  Pulse: 74  SpO2: 95%     Physical Exam Constitutional:      Appearance: Normal appearance.  HENT:     Head: Normocephalic.     Mouth/Throat:     Mouth: Mucous membranes are moist.  Eyes:     General: No scleral icterus. Cardiovascular:     Rate and Rhythm: Normal rate and regular rhythm.     Heart sounds: No murmur heard.    No friction rub.  Pulmonary:     Effort: No respiratory distress.     Breath sounds: No stridor. No wheezing or rhonchi.  Musculoskeletal:     Cervical back: No rigidity or tenderness.  Neurological:     Mental Status: He is alert.  Psychiatric:        Mood and Affect: Mood normal.    Data Reviewed: PFT reviewed with the patient today showing moderate restriction, likely combination obstruction and restriction, no significant  bronchodilator response, moderate reduction in diffusing capacity.  No previous PFT to compare current 1 with  Assessment:  History of chronic obstructive pulmonary disease -Continue Trelegy  Shortness of breath on exertion -Continue graded activities as tolerated  History of multiple myeloma -On Revlimid  History of pulmonary embolism -Lifetime  anticoagulation  Plan/Recommendations: Continue Trelegy  Albuterol as needed  Graded activities as tolerated  Encouraged to call with significant concerns  Follow-up in about 6 months   Virl Diamond MD  Pulmonary and Critical Care 05/29/2023, 4:19 PM  CC: Wilmer Floor, NP

## 2023-05-29 NOTE — Progress Notes (Signed)
Full PFT performed today. °

## 2023-05-29 NOTE — Patient Instructions (Signed)
Continue using Trelegy daily  Albuterol use as needed  Continue graded activities as tolerated  Call us with significant concerns  I will see you 6 months from here

## 2023-05-30 LAB — MULTIPLE MYELOMA PANEL, SERUM
Albumin SerPl Elph-Mcnc: 3.4 g/dL (ref 2.9–4.4)
Albumin/Glob SerPl: 1.3 (ref 0.7–1.7)
Alpha 1: 0.3 g/dL (ref 0.0–0.4)
Alpha2 Glob SerPl Elph-Mcnc: 0.7 g/dL (ref 0.4–1.0)
B-Globulin SerPl Elph-Mcnc: 1 g/dL (ref 0.7–1.3)
Gamma Glob SerPl Elph-Mcnc: 0.8 g/dL (ref 0.4–1.8)
Globulin, Total: 2.7 g/dL (ref 2.2–3.9)
IgA: 233 mg/dL (ref 61–437)
IgG (Immunoglobin G), Serum: 774 mg/dL (ref 603–1613)
IgM (Immunoglobulin M), Srm: 14 mg/dL — ABNORMAL LOW (ref 15–143)
Total Protein ELP: 6.1 g/dL (ref 6.0–8.5)

## 2023-06-01 ENCOUNTER — Inpatient Hospital Stay: Payer: Medicare HMO

## 2023-06-01 ENCOUNTER — Inpatient Hospital Stay: Payer: Medicare HMO | Admitting: Physician Assistant

## 2023-06-08 DIAGNOSIS — S8264XA Nondisplaced fracture of lateral malleolus of right fibula, initial encounter for closed fracture: Secondary | ICD-10-CM | POA: Diagnosis not present

## 2023-06-08 LAB — PULMONARY FUNCTION TEST
DL/VA % pred: 98 %
DL/VA: 3.86 ml/min/mmHg/L
DLCO cor % pred: 63 %
DLCO cor: 14.68 ml/min/mmHg
DLCO unc % pred: 59 %
DLCO unc: 13.68 ml/min/mmHg
FEF 25-75 Post: 1.71 L/s
FEF 25-75 Pre: 1.67 L/s
FEF2575-%Change-Post: 2 %
FEF2575-%Pred-Post: 92 %
FEF2575-%Pred-Pre: 90 %
FEV1-%Change-Post: 2 %
FEV1-%Pred-Post: 66 %
FEV1-%Pred-Pre: 64 %
FEV1-Post: 1.76 L
FEV1-Pre: 1.72 L
FEV1FVC-%Change-Post: -2 %
FEV1FVC-%Pred-Pre: 114 %
FEV6-%Change-Post: 5 %
FEV6-%Pred-Post: 63 %
FEV6-%Pred-Pre: 60 %
FEV6-Post: 2.22 L
FEV6-Pre: 2.11 L
FEV6FVC-%Pred-Post: 107 %
FEV6FVC-%Pred-Pre: 107 %
FVC-%Change-Post: 5 %
FVC-%Pred-Post: 59 %
FVC-%Pred-Pre: 56 %
FVC-Post: 2.22 L
FVC-Pre: 2.11 L
Post FEV1/FVC ratio: 79 %
Post FEV6/FVC ratio: 100 %
Pre FEV1/FVC ratio: 82 %
Pre FEV6/FVC Ratio: 100 %
RV % pred: 82 %
RV: 2.09 L
TLC % pred: 71 %
TLC: 4.76 L

## 2023-06-13 ENCOUNTER — Inpatient Hospital Stay
Admission: RE | Admit: 2023-06-13 | Discharge: 2023-06-13 | Discharge status: Home / Self Care | Payer: Medicare HMO | Source: Ambulatory Visit | Attending: Physical Medicine & Rehabilitation | Admitting: Physical Medicine & Rehabilitation

## 2023-06-13 DIAGNOSIS — M5416 Radiculopathy, lumbar region: Secondary | ICD-10-CM | POA: Diagnosis not present

## 2023-06-22 DIAGNOSIS — E1165 Type 2 diabetes mellitus with hyperglycemia: Secondary | ICD-10-CM | POA: Diagnosis not present

## 2023-06-25 ENCOUNTER — Other Ambulatory Visit: Payer: Self-pay | Admitting: Hematology and Oncology

## 2023-06-25 DIAGNOSIS — C9001 Multiple myeloma in remission: Secondary | ICD-10-CM

## 2023-06-27 DIAGNOSIS — S8264XA Nondisplaced fracture of lateral malleolus of right fibula, initial encounter for closed fracture: Secondary | ICD-10-CM | POA: Diagnosis not present

## 2023-06-27 DIAGNOSIS — E1142 Type 2 diabetes mellitus with diabetic polyneuropathy: Secondary | ICD-10-CM | POA: Diagnosis not present

## 2023-06-27 DIAGNOSIS — S2243XG Multiple fractures of ribs, bilateral, subsequent encounter for fracture with delayed healing: Secondary | ICD-10-CM | POA: Diagnosis not present

## 2023-06-27 DIAGNOSIS — G894 Chronic pain syndrome: Secondary | ICD-10-CM | POA: Diagnosis not present

## 2023-06-27 DIAGNOSIS — C9000 Multiple myeloma not having achieved remission: Secondary | ICD-10-CM | POA: Diagnosis not present

## 2023-06-27 DIAGNOSIS — M51369 Other intervertebral disc degeneration, lumbar region without mention of lumbar back pain or lower extremity pain: Secondary | ICD-10-CM | POA: Diagnosis not present

## 2023-06-27 DIAGNOSIS — Z79899 Other long term (current) drug therapy: Secondary | ICD-10-CM | POA: Diagnosis not present

## 2023-06-29 ENCOUNTER — Inpatient Hospital Stay: Payer: Medicare HMO

## 2023-06-29 ENCOUNTER — Other Ambulatory Visit: Payer: Self-pay | Admitting: Hematology and Oncology

## 2023-06-29 ENCOUNTER — Inpatient Hospital Stay: Payer: Medicare HMO | Attending: Oncology

## 2023-06-29 DIAGNOSIS — C9001 Multiple myeloma in remission: Secondary | ICD-10-CM

## 2023-07-02 ENCOUNTER — Telehealth: Payer: Self-pay | Admitting: *Deleted

## 2023-07-02 ENCOUNTER — Other Ambulatory Visit: Payer: Self-pay | Admitting: *Deleted

## 2023-07-02 DIAGNOSIS — C9001 Multiple myeloma in remission: Secondary | ICD-10-CM

## 2023-07-02 MED ORDER — LENALIDOMIDE 15 MG PO CAPS: 15.0000 mg | ORAL_CAPSULE | Freq: Every day | ORAL | 0 refills | Status: DC

## 2023-07-02 NOTE — Telephone Encounter (Signed)
Received vm message from pt requesting a call back.  TCT patient and spoke with him. He states he wanted to make sure his Revlimid has been sent in. Advised that the refill was sent in this morning to [facility] at approx 11:18am. Pt voiced understanding.

## 2023-07-20 ENCOUNTER — Encounter (INDEPENDENT_AMBULATORY_CARE_PROVIDER_SITE_OTHER): Payer: Self-pay

## 2023-07-20 ENCOUNTER — Ambulatory Visit (INDEPENDENT_AMBULATORY_CARE_PROVIDER_SITE_OTHER): Payer: Medicare HMO | Admitting: Otolaryngology

## 2023-07-20 VITALS — Ht 72.0 in | Wt 200.0 lb

## 2023-07-20 DIAGNOSIS — J310 Chronic rhinitis: Secondary | ICD-10-CM

## 2023-07-20 DIAGNOSIS — H608X1 Other otitis externa, right ear: Secondary | ICD-10-CM | POA: Diagnosis not present

## 2023-07-20 DIAGNOSIS — H6981 Other specified disorders of Eustachian tube, right ear: Secondary | ICD-10-CM

## 2023-07-20 DIAGNOSIS — R0981 Nasal congestion: Secondary | ICD-10-CM | POA: Diagnosis not present

## 2023-07-20 MED ORDER — MOMETASONE FUROATE 0.1 % EX CREA: TOPICAL_CREAM | CUTANEOUS | 3 refills | Status: DC

## 2023-07-23 DIAGNOSIS — H6981 Other specified disorders of Eustachian tube, right ear: Secondary | ICD-10-CM | POA: Insufficient documentation

## 2023-07-23 DIAGNOSIS — H608X1 Other otitis externa, right ear: Secondary | ICD-10-CM | POA: Insufficient documentation

## 2023-07-23 NOTE — Progress Notes (Signed)
Patient ID: James Rim., male   DOB: Dec 26, 1945, 77 y.o.   MRN: [id number]  Follow-up: Right ear eustachian tube dysfunction  HPI: The patient is a 77 year old male who returns today for his follow-up evaluation.  He was last seen in August 2024.  At that time, he was noted to have clogging sensation in his right ear, secondary to eustachian tube dysfunction.  He was treated with Flonase and Valsalva exercise.  The patient returns today complaining of persistent clogging sensation in his right ear.  In addition, he also complains of frequent itchy sensation in the right ear.  He denies any significant otalgia, otorrhea, or vertigo.  He was previously noted to have bilateral high-frequency sensorineural hearing loss, secondary to routine presbycusis.  Exam: General: Communicates without difficulty, well nourished, no acute distress. Head: Normocephalic, no evidence injury, no tenderness, facial buttresses intact without stepoff. Face/sinus: No tenderness to palpation and percussion. Facial movement is normal and symmetric. Eyes: PERRL, EOMI. No scleral icterus, conjunctivae clear. Neuro: CN II exam reveals vision grossly intact.  No nystagmus at any point of gaze. Ears: Auricles well formed without lesions.  Ear canals are intact with eczematous changes noted within the right ear canal.  Nose: External evaluation reveals normal support and skin without lesions.  Dorsum is intact.  Anterior rhinoscopy reveals congested mucosa over anterior aspect of inferior turbinates and intact septum.  No purulence noted. Oral:  Oral cavity and oropharynx are intact, symmetric, without erythema or edema.  Mucosa is moist without lesions. Neck: Full range of motion without pain.  There is no significant lymphadenopathy.  No masses palpable.  Thyroid bed within normal limits to palpation.  Parotid glands and submandibular glands equal bilaterally without mass.  Trachea is midline. Neuro:  CN 2-12 grossly intact.    Assessment: 1.  Right ear chronic eczematous otitis externa. 2.  Right ear eustachian tube dysfunction. 3.  Mild chronic rhinitis with nasal congestion.  Plan: 1.  The physical exam findings are reviewed with the patient. 2.  Continue with Flonase nasal sprays daily.  Valsalva exercises encouraged. 3.  Elocon cream to treat the chronic eczematous otitis externa. 4.  The patient will return for reevaluation in 2 months.

## 2023-07-24 ENCOUNTER — Other Ambulatory Visit: Payer: Self-pay | Admitting: Hematology and Oncology

## 2023-07-24 DIAGNOSIS — C9001 Multiple myeloma in remission: Secondary | ICD-10-CM

## 2023-07-26 ENCOUNTER — Other Ambulatory Visit: Payer: Self-pay | Admitting: *Deleted

## 2023-07-26 DIAGNOSIS — S8264XA Nondisplaced fracture of lateral malleolus of right fibula, initial encounter for closed fracture: Secondary | ICD-10-CM | POA: Diagnosis not present

## 2023-07-26 DIAGNOSIS — C9001 Multiple myeloma in remission: Secondary | ICD-10-CM

## 2023-07-26 MED ORDER — LENALIDOMIDE 15 MG PO CAPS: 15.0000 mg | ORAL_CAPSULE | Freq: Every day | ORAL | 0 refills | Status: DC

## 2023-07-27 ENCOUNTER — Inpatient Hospital Stay: Payer: Medicare HMO | Attending: Oncology

## 2023-08-16 ENCOUNTER — Other Ambulatory Visit: Payer: Self-pay | Admitting: *Deleted

## 2023-08-16 DIAGNOSIS — C9001 Multiple myeloma in remission: Secondary | ICD-10-CM

## 2023-08-16 MED ORDER — LENALIDOMIDE 15 MG PO CAPS: 15.0000 mg | ORAL_CAPSULE | Freq: Every day | ORAL | 0 refills | Status: DC

## 2023-08-23 ENCOUNTER — Other Ambulatory Visit: Payer: Self-pay | Admitting: Physician Assistant

## 2023-08-23 DIAGNOSIS — C9001 Multiple myeloma in remission: Secondary | ICD-10-CM

## 2023-08-23 NOTE — Progress Notes (Signed)
[facility] [phone]   [phone]  PROGRESS NOTE  Patient Care Team: Wilmer Floor, NP as PCP - General Jodelle Red, MD as PCP - Cardiology (Cardiology)  Hematological/Oncological History # IgA Kappa Multiple Myeloma in Remission 2017: diagnosed with IgA Kappa MM 01/28/2016: Revlimid at 25 mg daily for 21 days with dexamethasone at 20 mg weekly. Reached CR in Jan 2018 10/2018: relapsed disease with M spike of 3.9 g/dL IgM level of 4540.  May 2020: Revlimid 15 mg daily for 21 days out of a 28 day cycle with dexamethasone 40 mg weekly  May 2021: achieved a complete response to therapy. Started maintenance Revlimid 15 mg daily restarted in June 2021 with dexamethasone 4 mg weekly  09/07/2022: transition care to Dr. Leonides Schanz   Interval History:  James [name]. 78 y.o. male with medical history significant for IgA Kappa MM who presents for a follow up visit. The patient's last visit was on 05/25/2023. In the interim since the last visit he was admitted for pulmonary embolism and his Revlimid was held.  He was subsequently started on Eliquis therapy.  On exam today Mr. Martos reports no changes to his health since the last visit. HE is tolerating Revlimid therapy without any major side effects. He reports energy and appetite are overall stable. He is taking his Eliquis medication without any bleeding, bruising, or dark stools.  He continues to struggle with chronic low back pain which he shares impacts his ADLs. He would like to do some yard work but is unable to do so because of his back pain. Otherwise he has no questions concerns or complaints today.  He otherwise denies any fevers, chills, sweats, nausea, vomiting or diarrhea.  He denies any chest pain or shortness of breath.  A full 10 point ROS was otherwise negative.  MEDICAL HISTORY:  Past Medical History:  Diagnosis Date   Anemia    Arthritis    BPH (benign prostatic hyperplasia)     CHF (congestive heart failure) (HCC)    Chronic pain    Diabetes mellitus    Dyspnea    GERD (gastroesophageal reflux disease)    Headache(784.0)    migraines, sinus headaches   Hepatitis    C and B-treated   HLD (hyperlipidemia)    Hypertension    Leukemia (HCC)    Lupus    Multiple myeloma (HCC)    Pneumonia 07/2011   Sleep apnea 03/2018   going for fitting on 04-29-18   Thyroid disease    goiter   Urinary frequency     SURGICAL HISTORY: Past Surgical History:  Procedure Laterality Date   ABDOMINAL SURGERY     BACK SURGERY     x 5    BIOPSY  01/02/2020   Procedure: BIOPSY;  Surgeon: Jeani Hawking, MD;  Location: [facility];  Service: Endoscopy;;   CHOLECYSTECTOMY     COLONOSCOPY     COLONOSCOPY WITH PROPOFOL N/A 01/02/2020   Procedure: COLONOSCOPY WITH PROPOFOL;  Surgeon: Jeani Hawking, MD;  Location: [facility];  Service: Endoscopy;  Laterality: N/A;   cyst removal skull  20 years ago   ETHMOIDECTOMY  10/12/2011   Procedure: ETHMOIDECTOMY;  Surgeon: Keturah Barre, MD;  Location: [facility] OR;  Service: ENT;  Laterality: Bilateral;  bilateral maxillary sinus osteal enlargement, frontal sinusotomy   EYE SURGERY     bilat cataract with lens implants   HAND SURGERY     right finger   HERNIA REPAIR  LAPAROSCOPIC APPENDECTOMY N/A 11/26/2022   Procedure: APPENDECTOMY LAPAROSCOPIC;  Surgeon: Emelia Loron, MD;  Location: [facility];  Service: General;  Laterality: N/A;   MICROLARYNGOSCOPY N/A 02/18/2021   Procedure: MICROLARYNGOSCOPY with Excisional Biopsy;  Surgeon: Osborn Coho, MD;  Location: [facility] OR;  Service: ENT;  Laterality: N/A;   POLYPECTOMY  01/02/2020   Procedure: POLYPECTOMY;  Surgeon: Jeani Hawking, MD;  Location: [facility];  Service: Endoscopy;;   ROTATOR CUFF REPAIR     bilateral   SHOULDER ARTHROSCOPY WITH ROTATOR CUFF REPAIR Left 05/18/2014   Procedure: SHOULDER ARTHROSCOPY WITH ROTATOR CUFF REPAIR;  Surgeon: Mable Paris, MD;  Location: [facility];  Service: Orthopedics;  Laterality: Left;  Left shoulder arthroscopy rotator cuff repair, subacromial decompression   sinus surgery     TONSILLECTOMY     TRIGGER FINGER RELEASE Right 05/02/2018   Procedure: RELEASE TRIGGER FINGER/A-1 PULLEY RIGHT SMALL FINGER;  Surgeon: Betha Loa, MD;  Location: [facility];  Service: Orthopedics;  Laterality: Right;    SOCIAL HISTORY: Social History   Socioeconomic History   Marital status: Married    Spouse name: Not on file   Number of children: Not on file   Years of education: Not on file   Highest education level: Not on file  Occupational History   Not on file  Tobacco Use   Smoking status: Former    Current packs/day: 0.00    Average packs/day: 1 pack/day for 26.0 years (26.0 ttl pk-yrs)    Types: Cigarettes    Start date: 05/12/1958    Quit date: 05/12/1984    Years since quitting: 39.3   Smokeless tobacco: Never  Vaping Use   Vaping status: Not on file  Substance and Sexual Activity   Alcohol use: No   Drug use: Not Currently    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 years ago   Sexual activity: Not Currently  Other Topics Concern   Not on file  Social History Narrative   Not on file   Social Drivers of Health   Financial Resource Strain: Not on file  Food Insecurity: No Food Insecurity (11/24/2022)   Hunger Vital Sign    Worried About Running Out of Food in the Last Year: Never true    Ran Out of Food in the Last Year: Never true  Transportation Needs: No Transportation Needs (11/24/2022)   PRAPARE - Administrator, Civil Service (Medical): No    Lack of Transportation (Non-Medical): No  Physical Activity: Not on file  Stress: Not on file  Social Connections: Not on file  Intimate Partner Violence: Not At Risk (11/24/2022)   Humiliation, Afraid, Rape, and Kick questionnaire    Fear of Current or Ex-Partner: No    Emotionally Abused: No    Physically Abused: No    Sexually  Abused: No    FAMILY HISTORY: Family History  Problem Relation Age of Onset   Hyperlipidemia Mother    Hypertension Mother     ALLERGIES:  is allergic to invokana [canagliflozin] and ace inhibitors.  MEDICATIONS:  Current Outpatient Medications  Medication Sig Dispense Refill   acetaminophen (TYLENOL) 325 MG tablet Take 3 tablets (975 mg total) by mouth every 6 (six) hours as needed.     amLODipine (NORVASC) 10 MG tablet Take 1 tablet (10 mg total) by mouth every morning. 30 tablet 0   apixaban (ELIQUIS) 5 MG TABS tablet Take 5 mg by mouth 2 (two) times daily.  Calcium Carb-Cholecalciferol 600-10 MG-MCG TABS Take 1 tablet by mouth 2 (two) times daily.     Calcium Carbonate-Vit D-Min (CALCIUM-VITAMIN D-MINERALS) 600-400 MG-UNIT CHEW Chew 1 tablet by mouth in the morning and at bedtime.     colestipol (COLESTID) 1 g tablet Take 2 g by mouth 2 (two) times daily. Noon and bedtime     Ferrous Fumarate (HEMOCYTE - 106 MG FE) 324 (106 Fe) MG TABS tablet Take 1 tablet by mouth every morning.     finasteride (PROSCAR) 5 MG tablet Take 5 mg by mouth every morning.     furosemide (LASIX) 20 MG tablet TAKE 1 TABLET BY MOUTH EVERY DAY (Patient taking differently: Take 20 mg by mouth in the morning.) 90 tablet 3   gabapentin (NEURONTIN) 400 MG capsule Take 400-800 mg by mouth See admin instructions. Take 400mg  by mouth at lunch time and 800 mg by mouth at bedtime.     HYDROcodone-acetaminophen (NORCO) 10-325 MG tablet Take 1 tablet by mouth every 6 (six) hours as needed for moderate pain or severe pain.     insulin degludec (TRESIBA FLEXTOUCH) 100 UNIT/ML FlexTouch Pen Inject 15 Units into the skin at bedtime.     lenalidomide (REVLIMID) 15 MG capsule Take 1 capsule (15 mg total) by mouth daily. Siri Cole # [id number]  Date Obtained 08/16/23  Take 1 capsule daily for 21 days. None for the next 7 days 21 capsule 0   lidocaine (LIDODERM) 5 % Place 1 patch onto the skin daily. Remove & Discard patch  within 12 hours or as directed by MD 30 patch 0   loperamide (IMODIUM A-D) 2 MG tablet Take 1 tablet (2 mg total) by mouth 3 (three) times daily as needed for diarrhea or loose stools. 20 tablet 0   loperamide (IMODIUM) 2 MG capsule Take 1 capsule (2 mg total) by mouth 4 (four) times daily as needed for diarrhea or loose stools. 12 capsule 0   losartan (COZAAR) 50 MG tablet Take 1 tablet (50 mg total) by mouth every morning. 30 tablet 0   mometasone (ELOCON) 0.1 % cream Apply topically daily as needed to treat the eczematous otitis externa. 15 g 3   morphine (MS CONTIN) 30 MG 12 hr tablet Take 30 mg by mouth every 12 (twelve) hours.     naloxone (NARCAN) nasal spray 4 mg/0.1 mL Place into the nose.     pantoprazole (PROTONIX) 40 MG tablet Take 40 mg by mouth 2 (two) times daily.     pioglitazone (ACTOS) 15 MG tablet Take 15 mg by mouth in the morning.     RESTASIS 0.05 % ophthalmic emulsion Place 1 drop into both eyes 2 (two) times daily as needed for dry eyes.     Tamsulosin HCl (FLOMAX) 0.4 MG CAPS Take 0.4 mg by mouth daily.     tiZANidine (ZANAFLEX) 4 MG tablet Take 1 tablet (4 mg total) by mouth every 8 (eight) hours as needed for muscle spasms. 30 tablet 0   TRELEGY ELLIPTA 200-62.5-25 MCG/ACT AEPB Take 1 puff by mouth 2 (two) times daily. (Patient taking differently: Take 1 puff by mouth daily.) 60 each 0   vitamin B-12 (CYANOCOBALAMIN) 500 MCG tablet Take 500 mcg by mouth every morning.     zolpidem (AMBIEN) 5 MG tablet Take 1 tablet (5 mg total) by mouth at bedtime as needed for sleep. 5 tablet 0   Potassium Chloride ER 20 MEQ TBCR Take 1 tablet (20 mEq total) by mouth daily for  7 days. 7 tablet 0   No current facility-administered medications for this visit.    REVIEW OF SYSTEMS:   Constitutional: ( - ) fevers, ( - )  chills , ( - ) night sweats Eyes: ( - ) blurriness of vision, ( - ) double vision, ( - ) watery eyes Ears, nose, mouth, throat, and face: ( - ) mucositis, ( - ) sore  throat Respiratory: ( - ) cough, ( - ) dyspnea, ( - ) wheezes Cardiovascular: ( - ) palpitation, ( - ) chest discomfort, ( - ) lower extremity swelling Gastrointestinal:  ( - ) nausea, ( - ) heartburn, ( - ) change in bowel habits Skin: ( - ) abnormal skin rashes Lymphatics: ( - ) new lymphadenopathy, ( - ) easy bruising Neurological: ( - ) numbness, ( - ) tingling, ( - ) new weaknesses Behavioral/Psych: ( - ) mood change, ( - ) new changes  All other systems were reviewed with the patient and are negative.  PHYSICAL EXAMINATION: ECOG PERFORMANCE STATUS: 1 - Symptomatic but completely ambulatory  Vitals:   08/24/23 1147  BP: (!) 132/53  Pulse: (!) 57  Resp: 16  Temp: 97.7 F (36.5 C)  SpO2: 97%     Filed Weights   08/24/23 1147  Weight: 196 lb 12.8 oz (89.3 kg)    GENERAL: alert, no distress and comfortable SKIN: skin color, texture, turgor are normal, no rashes or significant lesions EYES: conjunctiva are pink and non-injected, sclera clear LUNGS: clear to auscultation and percussion with normal breathing effort HEART: regular rate & rhythm and no murmurs and no lower extremity edema Musculoskeletal: no cyanosis of digits and no clubbing  PSYCH: alert & oriented x 3, fluent speech NEURO: no focal motor/sensory deficits  LABORATORY DATA:  I have reviewed the data as listed    Latest Ref Rng & Units 08/24/2023   11:38 AM 05/25/2023    2:09 PM 04/05/2023    3:47 PM  CBC  WBC 4.0 - 10.5 K/uL 4.2  5.5  4.3   Hemoglobin 13.0 - 17.0 g/dL 09.6  04.5  40.9   Hematocrit 39.0 - 52.0 % 36.1  36.6  37.3   Platelets 150 - 400 K/uL 156  215  174        Latest Ref Rng & Units 08/24/2023   11:38 AM 05/25/2023    2:09 PM 04/05/2023    3:47 PM  CMP  Glucose 70 - 99 mg/dL 811  914  782   BUN 8 - 23 mg/dL 16  14  13    Creatinine 0.61 - 1.24 mg/dL 9.56  2.13  0.86   Sodium 135 - 145 mmol/L 138  138  142   Potassium 3.5 - 5.1 mmol/L 4.0  4.0  3.6   Chloride 98 - 111 mmol/L 102  105   108   CO2 22 - 32 mmol/L 31  26  26    Calcium 8.9 - 10.3 mg/dL 8.8  8.7  9.0   Total Protein 6.5 - 8.1 g/dL 6.7  6.8  6.7   Total Bilirubin 0.0 - 1.2 mg/dL 0.5  0.4  0.6   Alkaline Phos 38 - 126 U/L 91  105  95   AST 15 - 41 U/L 18  23  18    ALT 0 - 44 U/L 20  16  17      Lab Results  Component Value Date   MPROTEIN Not Observed 05/25/2023   MPROTEIN Not Observed 04/05/2023  MPROTEIN Not Observed 03/05/2023   Lab Results  Component Value Date   KPAFRELGTCHN 41.1 (H) 05/25/2023   KPAFRELGTCHN 47.5 (H) 04/05/2023   KPAFRELGTCHN 43.6 (H) 03/05/2023   LAMBDASER 21.8 05/25/2023   LAMBDASER 24.5 04/05/2023   LAMBDASER 22.1 03/05/2023   KAPLAMBRATIO 1.89 (H) 05/25/2023   KAPLAMBRATIO 1.94 (H) 04/05/2023   KAPLAMBRATIO 1.97 (H) 03/05/2023    RADIOGRAPHIC STUDIES: No results found.  ASSESSMENT & PLAN James Krameris a 78 y.o. male with medical history significant for IgA Kappa MM who presents for a follow up visit.  # IgA Kappa Multiple Myeloma in Remission -- Currently on Revlimid 15 mg p.o. daily 21 days on and 7 days off. -- Most recent myeloma labs from 05/25/2023 show that M protein is undetectable, stable kappa light chain measuring 41.1.  -- Labs today show white blood cell count 4.2, hemoglobin 11.7, MCV 93.8, and platelets of 156.  Creatinine stable at 1.36. LFTs normal. Myeloma labs pending today  -- Recommend labs every 4 weeks with return to clinic in 3 months time.  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 -- After 6 months can consider decreasing down to Eliquis 2.5 mg twice daily.  Would recommend continuation of this as long as the patient is on Revlimid therapy.  #Chronic low back pain: --S/p multiple procedures including right laminectomy at L4-5 and left laminectomy at L5-S1.  --Most recent MRI lumbar spine from 06/13/2023 showed no acute changes. Moderately severe to severe central canal and  bilateral foraminal narrowing at L3-4.Moderate tomoderately severe foraminal narrowing at L4-5 is worse on the right and unchanged. Remote compression fractures --Patient request a referral to a new spine specialist. Will send referral to [facility].   No orders of the defined types were placed in this encounter.   All questions were answered. The patient knows to call the clinic with any problems, questions or concerns.  A total of more than 30 minutes were spent on this encounter with face-to-face time and non-face-to-face time, including preparing to see the patient, ordering tests and/or medications, counseling the patient and coordination of care as outlined above.   Georga Kaufmann PA-C Dept of Hematology and Oncology [facility] Phone: [phone]   08/24/2023 12:55 PM

## 2023-08-24 ENCOUNTER — Other Ambulatory Visit: Payer: Self-pay | Admitting: Physician Assistant

## 2023-08-24 ENCOUNTER — Inpatient Hospital Stay: Payer: Medicare HMO

## 2023-08-24 ENCOUNTER — Inpatient Hospital Stay: Payer: Medicare HMO | Attending: Oncology

## 2023-08-24 ENCOUNTER — Inpatient Hospital Stay: Payer: Medicare HMO | Admitting: Hematology and Oncology

## 2023-08-24 ENCOUNTER — Inpatient Hospital Stay (HOSPITAL_BASED_OUTPATIENT_CLINIC_OR_DEPARTMENT_OTHER): Payer: Medicare HMO | Admitting: Physician Assistant

## 2023-08-24 VITALS — BP 132/53 | HR 57 | Temp 97.7°F | Resp 16 | Wt 196.8 lb

## 2023-08-24 DIAGNOSIS — Z9049 Acquired absence of other specified parts of digestive tract: Secondary | ICD-10-CM | POA: Diagnosis not present

## 2023-08-24 DIAGNOSIS — Z9089 Acquired absence of other organs: Secondary | ICD-10-CM | POA: Diagnosis not present

## 2023-08-24 DIAGNOSIS — G8929 Other chronic pain: Secondary | ICD-10-CM

## 2023-08-24 DIAGNOSIS — E119 Type 2 diabetes mellitus without complications: Secondary | ICD-10-CM | POA: Insufficient documentation

## 2023-08-24 DIAGNOSIS — M545 Low back pain, unspecified: Secondary | ICD-10-CM | POA: Diagnosis not present

## 2023-08-24 DIAGNOSIS — C9001 Multiple myeloma in remission: Secondary | ICD-10-CM

## 2023-08-24 DIAGNOSIS — Z7901 Long term (current) use of anticoagulants: Secondary | ICD-10-CM | POA: Insufficient documentation

## 2023-08-24 DIAGNOSIS — I509 Heart failure, unspecified: Secondary | ICD-10-CM | POA: Diagnosis not present

## 2023-08-24 DIAGNOSIS — Z83438 Family history of other disorder of lipoprotein metabolism and other lipidemia: Secondary | ICD-10-CM | POA: Insufficient documentation

## 2023-08-24 DIAGNOSIS — Z8249 Family history of ischemic heart disease and other diseases of the circulatory system: Secondary | ICD-10-CM | POA: Insufficient documentation

## 2023-08-24 DIAGNOSIS — E079 Disorder of thyroid, unspecified: Secondary | ICD-10-CM | POA: Diagnosis not present

## 2023-08-24 DIAGNOSIS — I110 Hypertensive heart disease with heart failure: Secondary | ICD-10-CM | POA: Diagnosis not present

## 2023-08-24 DIAGNOSIS — Z87891 Personal history of nicotine dependence: Secondary | ICD-10-CM | POA: Diagnosis not present

## 2023-08-24 DIAGNOSIS — N400 Enlarged prostate without lower urinary tract symptoms: Secondary | ICD-10-CM | POA: Diagnosis not present

## 2023-08-24 DIAGNOSIS — Z79899 Other long term (current) drug therapy: Secondary | ICD-10-CM | POA: Diagnosis not present

## 2023-08-24 DIAGNOSIS — M48061 Spinal stenosis, lumbar region without neurogenic claudication: Secondary | ICD-10-CM | POA: Diagnosis not present

## 2023-08-24 DIAGNOSIS — I2699 Other pulmonary embolism without acute cor pulmonale: Secondary | ICD-10-CM | POA: Diagnosis not present

## 2023-08-24 LAB — CMP (CANCER CENTER ONLY)
ALT: 20 U/L (ref 0–44)
AST: 18 U/L (ref 15–41)
Albumin: 3.8 g/dL (ref 3.5–5.0)
Alkaline Phosphatase: 91 U/L (ref 38–126)
Anion gap: 5 (ref 5–15)
BUN: 16 mg/dL (ref 8–23)
CO2: 31 mmol/L (ref 22–32)
Calcium: 8.8 mg/dL — ABNORMAL LOW (ref 8.9–10.3)
Chloride: 102 mmol/L (ref 98–111)
Creatinine: 1.36 mg/dL — ABNORMAL HIGH (ref 0.61–1.24)
GFR, Estimated: 53 mL/min — ABNORMAL LOW (ref >60)
Glucose, Bld: 112 mg/dL — ABNORMAL HIGH (ref 70–99)
Potassium: 4 mmol/L (ref 3.5–5.1)
Sodium: 138 mmol/L (ref 135–145)
Total Bilirubin: 0.5 mg/dL (ref 0.0–1.2)
Total Protein: 6.7 g/dL (ref 6.5–8.1)

## 2023-08-24 LAB — CBC WITH DIFFERENTIAL (CANCER CENTER ONLY)
Abs Immature Granulocytes: 0.02 10*3/uL (ref 0.00–0.07)
Basophils Absolute: 0 10*3/uL (ref 0.0–0.1)
Basophils Relative: 1 %
Eosinophils Absolute: 0.3 10*3/uL (ref 0.0–0.5)
Eosinophils Relative: 6 %
HCT: 36.1 % — ABNORMAL LOW (ref 39.0–52.0)
Hemoglobin: 11.7 g/dL — ABNORMAL LOW (ref 13.0–17.0)
Immature Granulocytes: 1 %
Lymphocytes Relative: 29 %
Lymphs Abs: 1.2 10*3/uL (ref 0.7–4.0)
MCH: 30.4 pg (ref 26.0–34.0)
MCHC: 32.4 g/dL (ref 30.0–36.0)
MCV: 93.8 fL (ref 80.0–100.0)
Monocytes Absolute: 0.4 10*3/uL (ref 0.1–1.0)
Monocytes Relative: 9 %
Neutro Abs: 2.3 10*3/uL (ref 1.7–7.7)
Neutrophils Relative %: 54 %
Platelet Count: 156 10*3/uL (ref 150–400)
RBC: 3.85 MIL/uL — ABNORMAL LOW (ref 4.22–5.81)
RDW: 15.4 % (ref 11.5–15.5)
WBC Count: 4.2 10*3/uL (ref 4.0–10.5)
nRBC: 0 % (ref 0.0–0.2)

## 2023-08-24 LAB — LACTATE DEHYDROGENASE: LDH: 127 U/L (ref 98–192)

## 2023-08-27 LAB — KAPPA/LAMBDA LIGHT CHAINS
Kappa free light chain: 55.8 mg/L — ABNORMAL HIGH (ref 3.3–19.4)
Kappa, lambda light chain ratio: 1.93 — ABNORMAL HIGH (ref 0.26–1.65)
Lambda free light chains: 28.9 mg/L — ABNORMAL HIGH (ref 5.7–26.3)

## 2023-08-28 LAB — MULTIPLE MYELOMA PANEL, SERUM
Albumin SerPl Elph-Mcnc: 3.4 g/dL (ref 2.9–4.4)
Albumin/Glob SerPl: 1.3 (ref 0.7–1.7)
Alpha 1: 0.3 g/dL (ref 0.0–0.4)
Alpha2 Glob SerPl Elph-Mcnc: 0.8 g/dL (ref 0.4–1.0)
B-Globulin SerPl Elph-Mcnc: 1 g/dL (ref 0.7–1.3)
Gamma Glob SerPl Elph-Mcnc: 0.8 g/dL (ref 0.4–1.8)
Globulin, Total: 2.8 g/dL (ref 2.2–3.9)
IgA: 231 mg/dL (ref 61–437)
IgG (Immunoglobin G), Serum: 997 mg/dL (ref 603–1613)
IgM (Immunoglobulin M), Srm: 12 mg/dL — ABNORMAL LOW (ref 15–143)
Total Protein ELP: 6.2 g/dL (ref 6.0–8.5)

## 2023-08-29 ENCOUNTER — Telehealth: Payer: Self-pay

## 2023-08-29 NOTE — Telephone Encounter (Signed)
Late entry: Patient request a referral to a new spine specialist. Will send referral to [facility].    Referral faxed and confirmation received on 08/24/23  Per [facility] pt has already been seen there in 2022 and was referred to pain management.

## 2023-09-04 ENCOUNTER — Telehealth (INDEPENDENT_AMBULATORY_CARE_PROVIDER_SITE_OTHER): Payer: Self-pay | Admitting: Otolaryngology

## 2023-09-04 NOTE — Telephone Encounter (Signed)
Flonase 50 mcg nasal spray was called in at [facility] on [address] in [place], with 5 refills.

## 2023-09-19 ENCOUNTER — Other Ambulatory Visit: Payer: Self-pay | Admitting: Hematology and Oncology

## 2023-09-19 DIAGNOSIS — C9001 Multiple myeloma in remission: Secondary | ICD-10-CM

## 2023-09-20 ENCOUNTER — Telehealth (INDEPENDENT_AMBULATORY_CARE_PROVIDER_SITE_OTHER): Payer: Self-pay | Admitting: Otolaryngology

## 2023-09-20 ENCOUNTER — Other Ambulatory Visit: Payer: Self-pay | Admitting: *Deleted

## 2023-09-20 DIAGNOSIS — C9001 Multiple myeloma in remission: Secondary | ICD-10-CM

## 2023-09-20 MED ORDER — LENALIDOMIDE 15 MG PO CAPS: 15.0000 mg | ORAL_CAPSULE | Freq: Every day | ORAL | 0 refills | Status: DC

## 2023-09-20 NOTE — Telephone Encounter (Signed)
Reminder call: Date: 09/21/2023 Status: Sch  Time: 11:10 AM [address] [place], [place] [address]  Left voicemail w/time and location

## 2023-09-21 ENCOUNTER — Ambulatory Visit (INDEPENDENT_AMBULATORY_CARE_PROVIDER_SITE_OTHER): Payer: Medicare HMO

## 2023-09-28 ENCOUNTER — Inpatient Hospital Stay: Payer: Medicare HMO | Attending: Physician Assistant

## 2023-09-28 DIAGNOSIS — C9001 Multiple myeloma in remission: Secondary | ICD-10-CM | POA: Diagnosis not present

## 2023-09-28 LAB — CBC WITH DIFFERENTIAL (CANCER CENTER ONLY)
Abs Immature Granulocytes: 0 10*3/uL (ref 0.00–0.07)
Basophils Absolute: 0 10*3/uL (ref 0.0–0.1)
Basophils Relative: 1 %
Eosinophils Absolute: 0.4 10*3/uL (ref 0.0–0.5)
Eosinophils Relative: 13 %
HCT: 34.9 % — ABNORMAL LOW (ref 39.0–52.0)
Hemoglobin: 11.2 g/dL — ABNORMAL LOW (ref 13.0–17.0)
Immature Granulocytes: 0 %
Lymphocytes Relative: 30 %
Lymphs Abs: 0.9 10*3/uL (ref 0.7–4.0)
MCH: 30.3 pg (ref 26.0–34.0)
MCHC: 32.1 g/dL (ref 30.0–36.0)
MCV: 94.3 fL (ref 80.0–100.0)
Monocytes Absolute: 0.3 10*3/uL (ref 0.1–1.0)
Monocytes Relative: 11 %
Neutro Abs: 1.4 10*3/uL — ABNORMAL LOW (ref 1.7–7.7)
Neutrophils Relative %: 45 %
Platelet Count: 180 10*3/uL (ref 150–400)
RBC: 3.7 MIL/uL — ABNORMAL LOW (ref 4.22–5.81)
RDW: 16.8 % — ABNORMAL HIGH (ref 11.5–15.5)
WBC Count: 3.1 10*3/uL — ABNORMAL LOW (ref 4.0–10.5)
nRBC: 0 % (ref 0.0–0.2)

## 2023-09-28 LAB — CMP (CANCER CENTER ONLY)
ALT: 14 U/L (ref 0–44)
AST: 17 U/L (ref 15–41)
Albumin: 3.7 g/dL (ref 3.5–5.0)
Alkaline Phosphatase: 92 U/L (ref 38–126)
Anion gap: 5 (ref 5–15)
BUN: 14 mg/dL (ref 8–23)
CO2: 30 mmol/L (ref 22–32)
Calcium: 8.5 mg/dL — ABNORMAL LOW (ref 8.9–10.3)
Chloride: 106 mmol/L (ref 98–111)
Creatinine: 1.37 mg/dL — ABNORMAL HIGH (ref 0.61–1.24)
GFR, Estimated: 52 mL/min — ABNORMAL LOW (ref >60)
Glucose, Bld: 84 mg/dL (ref 70–99)
Potassium: 3.8 mmol/L (ref 3.5–5.1)
Sodium: 141 mmol/L (ref 135–145)
Total Bilirubin: 0.4 mg/dL (ref 0.0–1.2)
Total Protein: 6.5 g/dL (ref 6.5–8.1)

## 2023-09-28 LAB — LACTATE DEHYDROGENASE: LDH: 163 U/L (ref 98–192)

## 2023-10-01 LAB — KAPPA/LAMBDA LIGHT CHAINS
Kappa free light chain: 52.7 mg/L — ABNORMAL HIGH (ref 3.3–19.4)
Kappa, lambda light chain ratio: 1.92 — ABNORMAL HIGH (ref 0.26–1.65)
Lambda free light chains: 27.4 mg/L — ABNORMAL HIGH (ref 5.7–26.3)

## 2023-10-02 DIAGNOSIS — D649 Anemia, unspecified: Secondary | ICD-10-CM | POA: Diagnosis not present

## 2023-10-02 DIAGNOSIS — I10 Essential (primary) hypertension: Secondary | ICD-10-CM | POA: Diagnosis not present

## 2023-10-02 DIAGNOSIS — E119 Type 2 diabetes mellitus without complications: Secondary | ICD-10-CM | POA: Diagnosis not present

## 2023-10-02 DIAGNOSIS — E785 Hyperlipidemia, unspecified: Secondary | ICD-10-CM | POA: Diagnosis not present

## 2023-10-02 LAB — MULTIPLE MYELOMA PANEL, SERUM
Albumin SerPl Elph-Mcnc: 3.3 g/dL (ref 2.9–4.4)
Albumin/Glob SerPl: 1.2 (ref 0.7–1.7)
Alpha 1: 0.3 g/dL (ref 0.0–0.4)
Alpha2 Glob SerPl Elph-Mcnc: 0.7 g/dL (ref 0.4–1.0)
B-Globulin SerPl Elph-Mcnc: 0.9 g/dL (ref 0.7–1.3)
Gamma Glob SerPl Elph-Mcnc: 0.9 g/dL (ref 0.4–1.8)
Globulin, Total: 2.8 g/dL (ref 2.2–3.9)
IgA: 238 mg/dL (ref 61–437)
IgG (Immunoglobin G), Serum: 1005 mg/dL (ref 603–1613)
IgM (Immunoglobulin M), Srm: 13 mg/dL — ABNORMAL LOW (ref 15–143)
Total Protein ELP: 6.1 g/dL (ref 6.0–8.5)

## 2023-10-18 ENCOUNTER — Other Ambulatory Visit: Payer: Self-pay | Admitting: Hematology and Oncology

## 2023-10-18 DIAGNOSIS — C9001 Multiple myeloma in remission: Secondary | ICD-10-CM

## 2023-10-19 ENCOUNTER — Inpatient Hospital Stay: Payer: Medicare HMO

## 2023-10-19 ENCOUNTER — Other Ambulatory Visit: Payer: Self-pay | Admitting: *Deleted

## 2023-10-19 ENCOUNTER — Inpatient Hospital Stay: Attending: Oncology

## 2023-10-19 ENCOUNTER — Other Ambulatory Visit: Payer: Self-pay

## 2023-10-19 ENCOUNTER — Other Ambulatory Visit: Payer: Self-pay | Admitting: Hematology and Oncology

## 2023-10-19 DIAGNOSIS — C9001 Multiple myeloma in remission: Secondary | ICD-10-CM | POA: Diagnosis not present

## 2023-10-19 LAB — CMP (CANCER CENTER ONLY)
ALT: 13 U/L (ref 0–44)
AST: 16 U/L (ref 15–41)
Albumin: 4.2 g/dL (ref 3.5–5.0)
Alkaline Phosphatase: 91 U/L (ref 38–126)
Anion gap: 7 (ref 5–15)
BUN: 13 mg/dL (ref 8–23)
CO2: 28 mmol/L (ref 22–32)
Calcium: 9.1 mg/dL (ref 8.9–10.3)
Chloride: 104 mmol/L (ref 98–111)
Creatinine: 1.33 mg/dL — ABNORMAL HIGH (ref 0.61–1.24)
GFR, Estimated: 54 mL/min — ABNORMAL LOW (ref >60)
Glucose, Bld: 111 mg/dL — ABNORMAL HIGH (ref 70–99)
Potassium: 4 mmol/L (ref 3.5–5.1)
Sodium: 139 mmol/L (ref 135–145)
Total Bilirubin: 0.6 mg/dL (ref 0.0–1.2)
Total Protein: 7.2 g/dL (ref 6.5–8.1)

## 2023-10-19 LAB — CBC WITH DIFFERENTIAL (CANCER CENTER ONLY)
Abs Immature Granulocytes: 0.01 10*3/uL (ref 0.00–0.07)
Basophils Absolute: 0.1 10*3/uL (ref 0.0–0.1)
Basophils Relative: 2 %
Eosinophils Absolute: 0.2 10*3/uL (ref 0.0–0.5)
Eosinophils Relative: 5 %
HCT: 37.7 % — ABNORMAL LOW (ref 39.0–52.0)
Hemoglobin: 12.5 g/dL — ABNORMAL LOW (ref 13.0–17.0)
Immature Granulocytes: 0 %
Lymphocytes Relative: 25 %
Lymphs Abs: 1 10*3/uL (ref 0.7–4.0)
MCH: 30.6 pg (ref 26.0–34.0)
MCHC: 33.2 g/dL (ref 30.0–36.0)
MCV: 92.4 fL (ref 80.0–100.0)
Monocytes Absolute: 0.4 10*3/uL (ref 0.1–1.0)
Monocytes Relative: 10 %
Neutro Abs: 2.3 10*3/uL (ref 1.7–7.7)
Neutrophils Relative %: 58 %
Platelet Count: 147 10*3/uL — ABNORMAL LOW (ref 150–400)
RBC: 4.08 MIL/uL — ABNORMAL LOW (ref 4.22–5.81)
RDW: 16.7 % — ABNORMAL HIGH (ref 11.5–15.5)
WBC Count: 4 10*3/uL (ref 4.0–10.5)
nRBC: 0 % (ref 0.0–0.2)

## 2023-10-19 LAB — LACTATE DEHYDROGENASE: LDH: 144 U/L (ref 98–192)

## 2023-10-19 MED ORDER — LENALIDOMIDE 15 MG PO CAPS: 15.0000 mg | ORAL_CAPSULE | Freq: Every day | ORAL | 0 refills | Status: DC

## 2023-10-22 LAB — KAPPA/LAMBDA LIGHT CHAINS
Kappa free light chain: 46 mg/L — ABNORMAL HIGH (ref 3.3–19.4)
Kappa, lambda light chain ratio: 1.74 — ABNORMAL HIGH (ref 0.26–1.65)
Lambda free light chains: 26.5 mg/L — ABNORMAL HIGH (ref 5.7–26.3)

## 2023-10-23 LAB — MULTIPLE MYELOMA PANEL, SERUM
Albumin SerPl Elph-Mcnc: 3.7 g/dL (ref 2.9–4.4)
Albumin/Glob SerPl: 1.3 (ref 0.7–1.7)
Alpha 1: 0.3 g/dL (ref 0.0–0.4)
Alpha2 Glob SerPl Elph-Mcnc: 0.7 g/dL (ref 0.4–1.0)
B-Globulin SerPl Elph-Mcnc: 0.9 g/dL (ref 0.7–1.3)
Gamma Glob SerPl Elph-Mcnc: 1 g/dL (ref 0.4–1.8)
Globulin, Total: 2.9 g/dL (ref 2.2–3.9)
IgA: 268 mg/dL (ref 61–437)
IgG (Immunoglobin G), Serum: 1118 mg/dL (ref 603–1613)
IgM (Immunoglobulin M), Srm: 12 mg/dL — ABNORMAL LOW (ref 15–143)
Total Protein ELP: 6.6 g/dL (ref 6.0–8.5)

## 2023-11-14 ENCOUNTER — Other Ambulatory Visit: Payer: Self-pay | Admitting: Hematology and Oncology

## 2023-11-14 DIAGNOSIS — C9001 Multiple myeloma in remission: Secondary | ICD-10-CM

## 2023-11-15 ENCOUNTER — Other Ambulatory Visit: Payer: Self-pay | Admitting: *Deleted

## 2023-11-15 DIAGNOSIS — C9001 Multiple myeloma in remission: Secondary | ICD-10-CM

## 2023-11-15 MED ORDER — LENALIDOMIDE 15 MG PO CAPS: 15.0000 mg | ORAL_CAPSULE | Freq: Every day | ORAL | 0 refills | Status: DC

## 2023-11-21 ENCOUNTER — Other Ambulatory Visit: Payer: Self-pay | Admitting: Hematology and Oncology

## 2023-11-21 DIAGNOSIS — C9001 Multiple myeloma in remission: Secondary | ICD-10-CM

## 2023-11-21 NOTE — Progress Notes (Signed)
 [facility] [phone]   [phone]  PROGRESS NOTE  Patient Care Team: Ortencia Blamer, NP as PCP - Jackelyn Marvel, MD as PCP - Cardiology (Cardiology)  Hematological/Oncological History # IgA Kappa Multiple Myeloma in Remission 2017: diagnosed with IgA Kappa MM 01/28/2016: Revlimid  at 25 mg daily for 21 days with dexamethasone  at 20 mg weekly. Reached CR in Jan 2018 10/2018: relapsed disease with M spike of 3.9 g/dL IgM level of 4540.  May 2020: Revlimid  15 mg daily for 21 days out of a 28 day cycle with dexamethasone  40 mg weekly  May 2021: achieved a complete response to therapy. Started maintenance Revlimid  15 mg daily restarted in June 2021 with dexamethasone  4 mg weekly  09/07/2022: transition care to Dr. Rosaline Coma   Interval History:  James [name]. 78 y.o. male with medical history significant for IgA Kappa MM who presents for a follow up visit. The patient's last visit was on 08/24/2023. In the interim since the last visit he was admitted for pulmonary embolism and his Revlimid  was held.  He was subsequently started on Eliquis  therapy.  On exam today Mr. Tozzi reports he has been "taking a daily time".  He notes he is tolerating his Revlimid  therapy well with no major side effects.  He is not having any nausea, vomiting, or diarrhea.  He is faithfully taking his Eliquis  5 mg twice daily.  There is no bleeding, bruising, or dark stools.  He reports that he does get some occasional swelling in his lower extremities.  He notes that he does have quite a bit of itching on his ears and dry skin on his legs.  He reports that he does occasionally have some urinary urgency.  He otherwise denies any fevers, chills, sweats, nausea, Oni or diarrhea.  Full 10 point ROS is otherwise negative.  MEDICAL HISTORY:  Past Medical History:  Diagnosis Date   Anemia    Arthritis    BPH (benign prostatic hyperplasia)    CHF (congestive heart failure)  (HCC)    Chronic pain    Diabetes mellitus    Dyspnea    GERD (gastroesophageal reflux disease)    Headache(784.0)    migraines, sinus headaches   Hepatitis    C and B-treated   HLD (hyperlipidemia)    Hypertension    Leukemia (HCC)    Lupus    Multiple myeloma (HCC)    Pneumonia 07/2011   Sleep apnea 03/2018   going for fitting on 04-29-18   Thyroid  disease    goiter   Urinary frequency     SURGICAL HISTORY: Past Surgical History:  Procedure Laterality Date   ABDOMINAL SURGERY     BACK SURGERY     x 5    BIOPSY  01/02/2020   Procedure: BIOPSY;  Surgeon: Alvis Jourdain, MD;  Location: [facility];  Service: Endoscopy;;   CHOLECYSTECTOMY     COLONOSCOPY     COLONOSCOPY WITH PROPOFOL  N/A 01/02/2020   Procedure: COLONOSCOPY WITH PROPOFOL ;  Surgeon: Alvis Jourdain, MD;  Location: [facility];  Service: Endoscopy;  Laterality: N/A;   cyst removal skull  20 years ago   ETHMOIDECTOMY  10/12/2011   Procedure: ETHMOIDECTOMY;  Surgeon: Deena Farrier, MD;  Location: [facility] OR;  Service: ENT;  Laterality: Bilateral;  bilateral maxillary sinus osteal enlargement, frontal sinusotomy   EYE SURGERY     bilat cataract with lens implants   HAND SURGERY     right finger   HERNIA  REPAIR     LAPAROSCOPIC APPENDECTOMY N/A 11/26/2022   Procedure: APPENDECTOMY LAPAROSCOPIC;  Surgeon: Enid Harry, MD;  Location: [facility];  Service: General;  Laterality: N/A;   MICROLARYNGOSCOPY N/A 02/18/2021   Procedure: MICROLARYNGOSCOPY with Excisional Biopsy;  Surgeon: Ammon Bales, MD;  Location: [facility] OR;  Service: ENT;  Laterality: N/A;   POLYPECTOMY  01/02/2020   Procedure: POLYPECTOMY;  Surgeon: Alvis Jourdain, MD;  Location: [facility];  Service: Endoscopy;;   ROTATOR CUFF REPAIR     bilateral   SHOULDER ARTHROSCOPY WITH ROTATOR CUFF REPAIR Left 05/18/2014   Procedure: SHOULDER ARTHROSCOPY WITH ROTATOR CUFF REPAIR;  Surgeon: Derald Flattery, MD;  Location: [facility];  Service:  Orthopedics;  Laterality: Left;  Left shoulder arthroscopy rotator cuff repair, subacromial decompression   sinus surgery     TONSILLECTOMY     TRIGGER FINGER RELEASE Right 05/02/2018   Procedure: RELEASE TRIGGER FINGER/A-1 PULLEY RIGHT SMALL FINGER;  Surgeon: Brunilda Capra, MD;  Location:  SURGERY CENTER;  Service: Orthopedics;  Laterality: Right;    SOCIAL HISTORY: Social History   Socioeconomic History   Marital status: Married    Spouse name: Not on file   Number of children: Not on file   Years of education: Not on file   Highest education level: Not on file  Occupational History   Not on file  Tobacco Use   Smoking status: Former    Current packs/day: 0.00    Average packs/day: 1 pack/day for 26.0 years (26.0 ttl pk-yrs)    Types: Cigarettes    Start date: 05/12/1958    Quit date: 05/12/1984    Years since quitting: 39.5   Smokeless tobacco: Never  Vaping Use   Vaping status: Not on file  Substance and Sexual Activity   Alcohol use: No   Drug use: Not Currently    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 years ago   Sexual activity: Not Currently  Other Topics Concern   Not on file  Social History Narrative   Not on file   Social Drivers of Health   Financial Resource Strain: Not on file  Food Insecurity: No Food Insecurity (11/24/2022)   Hunger Vital Sign    Worried About Running Out of Food in the Last Year: Never true    Ran Out of Food in the Last Year: Never true  Transportation Needs: No Transportation Needs (11/24/2022)   PRAPARE - Administrator, Civil Service (Medical): No    Lack of Transportation (Non-Medical): No  Physical Activity: Not on file  Stress: Not on file  Social Connections: Not on file  Intimate Partner Violence: Not At Risk (11/24/2022)   Humiliation, Afraid, Rape, and Kick questionnaire    Fear of Current or Ex-Partner: No    Emotionally Abused: No    Physically Abused: No    Sexually Abused: No    FAMILY  HISTORY: Family History  Problem Relation Age of Onset   Hyperlipidemia Mother    Hypertension Mother     ALLERGIES:  is allergic to invokana [canagliflozin] and ace inhibitors.  MEDICATIONS:  Current Outpatient Medications  Medication Sig Dispense Refill   acetaminophen  (TYLENOL ) 325 MG tablet Take 3 tablets (975 mg total) by mouth every 6 (six) hours as needed.     amLODipine  (NORVASC ) 10 MG tablet Take 1 tablet (10 mg total) by mouth every morning. 30 tablet 0   apixaban  (ELIQUIS ) 5 MG TABS tablet Take 5 mg by mouth  2 (two) times daily.     Calcium Carb-Cholecalciferol 600-10 MG-MCG TABS Take 1 tablet by mouth 2 (two) times daily.     Calcium Carbonate-Vit D-Min (CALCIUM-VITAMIN D-MINERALS) 600-400 MG-UNIT CHEW Chew 1 tablet by mouth in the morning and at bedtime.     colestipol  (COLESTID ) 1 g tablet Take 2 g by mouth 2 (two) times daily. Noon and bedtime     Ferrous Fumarate  (HEMOCYTE - 106 MG FE) 324 (106 Fe) MG TABS tablet Take 1 tablet by mouth every morning.     finasteride  (PROSCAR ) 5 MG tablet Take 5 mg by mouth every morning.     furosemide  (LASIX ) 20 MG tablet TAKE 1 TABLET BY MOUTH EVERY DAY (Patient taking differently: Take 20 mg by mouth in the morning.) 90 tablet 3   gabapentin  (NEURONTIN ) 400 MG capsule Take 400-800 mg by mouth See admin instructions. Take 400mg  by mouth at lunch time and 800 mg by mouth at bedtime.     HYDROcodone -acetaminophen  (NORCO) 10-325 MG tablet Take 1 tablet by mouth every 6 (six) hours as needed for moderate pain or severe pain.     insulin  degludec (TRESIBA  FLEXTOUCH) 100 UNIT/ML FlexTouch Pen Inject 15 Units into the skin at bedtime.     lenalidomide  (REVLIMID ) 15 MG capsule Take 1 capsule (15 mg total) by mouth daily. Arlyss Berkeley # [id number]   Date Obtained 11/15/23  Take 1 capsule daily for 21 days. None for the next 7 days 21 capsule 0   lidocaine  (LIDODERM ) 5 % Place 1 patch onto the skin daily. Remove & Discard patch within 12 hours or as  directed by MD 30 patch 0   loperamide  (IMODIUM  A-D) 2 MG tablet Take 1 tablet (2 mg total) by mouth 3 (three) times daily as needed for diarrhea or loose stools. 20 tablet 0   loperamide  (IMODIUM ) 2 MG capsule Take 1 capsule (2 mg total) by mouth 4 (four) times daily as needed for diarrhea or loose stools. 12 capsule 0   losartan  (COZAAR ) 50 MG tablet Take 1 tablet (50 mg total) by mouth every morning. 30 tablet 0   mometasone  (ELOCON ) 0.1 % cream Apply topically daily as needed to treat the eczematous otitis externa. 15 g 3   morphine  (MS CONTIN ) 30 MG 12 hr tablet Take 30 mg by mouth every 12 (twelve) hours.     naloxone (NARCAN) nasal spray 4 mg/0.1 mL Place into the nose.     pantoprazole  (PROTONIX ) 40 MG tablet Take 40 mg by mouth 2 (two) times daily.     pioglitazone (ACTOS) 15 MG tablet Take 15 mg by mouth in the morning.     Potassium Chloride  ER 20 MEQ TBCR Take 1 tablet (20 mEq total) by mouth daily for 7 days. 7 tablet 0   RESTASIS  0.05 % ophthalmic emulsion Place 1 drop into both eyes 2 (two) times daily as needed for dry eyes.     Tamsulosin  HCl (FLOMAX ) 0.4 MG CAPS Take 0.4 mg by mouth daily.     tiZANidine  (ZANAFLEX ) 4 MG tablet Take 1 tablet (4 mg total) by mouth every 8 (eight) hours as needed for muscle spasms. 30 tablet 0   TRELEGY ELLIPTA  200-62.5-25 MCG/ACT AEPB Take 1 puff by mouth 2 (two) times daily. (Patient taking differently: Take 1 puff by mouth daily.) 60 each 0   vitamin B-12 (CYANOCOBALAMIN) 500 MCG tablet Take 500 mcg by mouth every morning.     zolpidem  (AMBIEN ) 5 MG tablet Take 1 tablet (  5 mg total) by mouth at bedtime as needed for sleep. 5 tablet 0   No current facility-administered medications for this visit.    REVIEW OF SYSTEMS:   Constitutional: ( - ) fevers, ( - )  chills , ( - ) night sweats Eyes: ( - ) blurriness of vision, ( - ) double vision, ( - ) watery eyes Ears, nose, mouth, throat, and face: ( - ) mucositis, ( - ) sore throat Respiratory: ( -  ) cough, ( - ) dyspnea, ( - ) wheezes Cardiovascular: ( - ) palpitation, ( - ) chest discomfort, ( - ) lower extremity swelling Gastrointestinal:  ( - ) nausea, ( - ) heartburn, ( - ) change in bowel habits Skin: ( - ) abnormal skin rashes Lymphatics: ( - ) new lymphadenopathy, ( - ) easy bruising Neurological: ( - ) numbness, ( - ) tingling, ( - ) new weaknesses Behavioral/Psych: ( - ) mood change, ( - ) new changes  All other systems were reviewed with the patient and are negative.  PHYSICAL EXAMINATION: ECOG PERFORMANCE STATUS: 1 - Symptomatic but completely ambulatory  Vitals:   11/22/23 1151  BP: (!) 103/58  Pulse: (!) 58  Resp: 16  Temp: 98.3 F (36.8 C)  SpO2: 96%      Filed Weights   11/22/23 1151  Weight: 195 lb 8 oz (88.7 kg)     GENERAL: alert, no distress and comfortable SKIN: skin color, texture, turgor are normal, no rashes or significant lesions EYES: conjunctiva are pink and non-injected, sclera clear LUNGS: clear to auscultation and percussion with normal breathing effort HEART: regular rate & rhythm and no murmurs and no lower extremity edema Musculoskeletal: no cyanosis of digits and no clubbing  PSYCH: alert & oriented x 3, fluent speech NEURO: no focal motor/sensory deficits  LABORATORY DATA:  I have reviewed the data as listed    Latest Ref Rng & Units 11/22/2023   11:20 AM 10/19/2023    1:21 PM 09/28/2023   11:20 AM  CBC  WBC 4.0 - 10.5 K/uL 2.5  4.0  3.1   Hemoglobin 13.0 - 17.0 g/dL 81.1  91.4  78.2   Hematocrit 39.0 - 52.0 % 32.1  37.7  34.9   Platelets 150 - 400 K/uL 140  147  180        Latest Ref Rng & Units 11/22/2023   11:20 AM 10/19/2023    1:21 PM 09/28/2023   11:20 AM  CMP  Glucose 70 - 99 mg/dL 75  956  84   BUN 8 - 23 mg/dL 19  13  14    Creatinine 0.61 - 1.24 mg/dL 2.13  0.86  5.78   Sodium 135 - 145 mmol/L 139  139  141   Potassium 3.5 - 5.1 mmol/L 3.8  4.0  3.8   Chloride 98 - 111 mmol/L 106  104  106   CO2 22 - 32  mmol/L 29  28  30    Calcium 8.9 - 10.3 mg/dL 8.3  9.1  8.5   Total Protein 6.5 - 8.1 g/dL 6.3  7.2  6.5   Total Bilirubin 0.0 - 1.2 mg/dL 0.5  0.6  0.4   Alkaline Phos 38 - 126 U/L 87  91  92   AST 15 - 41 U/L 22  16  17    ALT 0 - 44 U/L 17  13  14      Lab Results  Component Value Date   MPROTEIN Not  Observed 11/22/2023   MPROTEIN Not Observed 10/19/2023   MPROTEIN Not Observed 09/28/2023   Lab Results  Component Value Date   KPAFRELGTCHN 59.3 (H) 11/22/2023   KPAFRELGTCHN 46.0 (H) 10/19/2023   KPAFRELGTCHN 52.7 (H) 09/28/2023   LAMBDASER 31.5 (H) 11/22/2023   LAMBDASER 26.5 (H) 10/19/2023   LAMBDASER 27.4 (H) 09/28/2023   KAPLAMBRATIO 1.88 (H) 11/22/2023   KAPLAMBRATIO 1.74 (H) 10/19/2023   KAPLAMBRATIO 1.92 (H) 09/28/2023    RADIOGRAPHIC STUDIES: No results found.  ASSESSMENT & PLAN James Krameris a 78 y.o. male with medical history significant for IgA Kappa MM who presents for a follow up visit.  # IgA Kappa Multiple Myeloma in Remission -- Currently on Revlimid  15 mg p.o. daily 21 days on and 7 days off. -- Most recent myeloma labs from 05/25/2023 show that M protein is undetectable, stable kappa light chain measuring 46.0, lambda 26.5, ratio 1.74  -- Labs today show white blood cell count 2.5, hemoglobin 10.8, MCV 90.9, platelets 140.  Creatinine 1.77. LFTs normal. Myeloma labs pending today  -- Recommend labs every 4 weeks with return to clinic in 3 months time.  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 -- Would recommend continuation of this as long as the patient is on Revlimid  therapy.  #Chronic low back pain: --S/p multiple procedures including right laminectomy at L4-5 and left laminectomy at L5-S1.  --Most recent MRI lumbar spine from 06/13/2023 showed no acute changes. Moderately severe to severe central canal and bilateral foraminal narrowing at L3-4.Moderate tomoderately severe foraminal  narrowing at L4-5 is worse on the right and unchanged. Remote compression fractures  No orders of the defined types were placed in this encounter.   All questions were answered. The patient knows to call the clinic with any problems, questions or concerns.  A total of more than 30 minutes were spent on this encounter with face-to-face time and non-face-to-face time, including preparing to see the patient, ordering tests and/or medications, counseling the patient and coordination of care as outlined above.   Rogerio Clay, MD Department of Hematology/Oncology [facility] at [facility] Phone: [phone] Pager: [phone] Email: [email or web address]   11/27/2023 12:28 PM

## 2023-11-22 ENCOUNTER — Inpatient Hospital Stay (HOSPITAL_BASED_OUTPATIENT_CLINIC_OR_DEPARTMENT_OTHER): Payer: Medicare HMO | Admitting: Hematology and Oncology

## 2023-11-22 ENCOUNTER — Inpatient Hospital Stay: Payer: Medicare HMO | Attending: Hematology and Oncology

## 2023-11-22 VITALS — BP 103/58 | HR 58 | Temp 98.3°F | Resp 16 | Wt 195.5 lb

## 2023-11-22 DIAGNOSIS — Z86711 Personal history of pulmonary embolism: Secondary | ICD-10-CM | POA: Diagnosis not present

## 2023-11-22 DIAGNOSIS — Z87891 Personal history of nicotine dependence: Secondary | ICD-10-CM | POA: Diagnosis not present

## 2023-11-22 DIAGNOSIS — Z79899 Other long term (current) drug therapy: Secondary | ICD-10-CM | POA: Diagnosis not present

## 2023-11-22 DIAGNOSIS — C9001 Multiple myeloma in remission: Secondary | ICD-10-CM | POA: Diagnosis not present

## 2023-11-22 DIAGNOSIS — Z7961 Long term (current) use of immunomodulator: Secondary | ICD-10-CM | POA: Diagnosis not present

## 2023-11-22 DIAGNOSIS — Z7901 Long term (current) use of anticoagulants: Secondary | ICD-10-CM | POA: Diagnosis not present

## 2023-11-22 LAB — CMP (CANCER CENTER ONLY)
ALT: 17 U/L (ref 0–44)
AST: 22 U/L (ref 15–41)
Albumin: 3.7 g/dL (ref 3.5–5.0)
Alkaline Phosphatase: 87 U/L (ref 38–126)
Anion gap: 4 — ABNORMAL LOW (ref 5–15)
BUN: 19 mg/dL (ref 8–23)
CO2: 29 mmol/L (ref 22–32)
Calcium: 8.3 mg/dL — ABNORMAL LOW (ref 8.9–10.3)
Chloride: 106 mmol/L (ref 98–111)
Creatinine: 1.77 mg/dL — ABNORMAL HIGH (ref 0.61–1.24)
GFR, Estimated: 39 mL/min — ABNORMAL LOW (ref >60)
Glucose, Bld: 75 mg/dL (ref 70–99)
Potassium: 3.8 mmol/L (ref 3.5–5.1)
Sodium: 139 mmol/L (ref 135–145)
Total Bilirubin: 0.5 mg/dL (ref 0.0–1.2)
Total Protein: 6.3 g/dL — ABNORMAL LOW (ref 6.5–8.1)

## 2023-11-22 LAB — CBC WITH DIFFERENTIAL (CANCER CENTER ONLY)
Abs Immature Granulocytes: 0 10*3/uL (ref 0.00–0.07)
Basophils Absolute: 0.1 10*3/uL (ref 0.0–0.1)
Basophils Relative: 2 %
Eosinophils Absolute: 0.4 10*3/uL (ref 0.0–0.5)
Eosinophils Relative: 14 %
HCT: 32.1 % — ABNORMAL LOW (ref 39.0–52.0)
Hemoglobin: 10.8 g/dL — ABNORMAL LOW (ref 13.0–17.0)
Immature Granulocytes: 0 %
Lymphocytes Relative: 35 %
Lymphs Abs: 0.9 10*3/uL (ref 0.7–4.0)
MCH: 30.6 pg (ref 26.0–34.0)
MCHC: 33.6 g/dL (ref 30.0–36.0)
MCV: 90.9 fL (ref 80.0–100.0)
Monocytes Absolute: 0.3 10*3/uL (ref 0.1–1.0)
Monocytes Relative: 11 %
Neutro Abs: 1 10*3/uL — ABNORMAL LOW (ref 1.7–7.7)
Neutrophils Relative %: 38 %
Platelet Count: 140 10*3/uL — ABNORMAL LOW (ref 150–400)
RBC: 3.53 MIL/uL — ABNORMAL LOW (ref 4.22–5.81)
RDW: 17.2 % — ABNORMAL HIGH (ref 11.5–15.5)
WBC Count: 2.5 10*3/uL — ABNORMAL LOW (ref 4.0–10.5)
nRBC: 0 % (ref 0.0–0.2)

## 2023-11-22 LAB — LACTATE DEHYDROGENASE: LDH: 152 U/L (ref 98–192)

## 2023-11-23 LAB — KAPPA/LAMBDA LIGHT CHAINS
Kappa free light chain: 59.3 mg/L — ABNORMAL HIGH (ref 3.3–19.4)
Kappa, lambda light chain ratio: 1.88 — ABNORMAL HIGH (ref 0.26–1.65)
Lambda free light chains: 31.5 mg/L — ABNORMAL HIGH (ref 5.7–26.3)

## 2023-11-26 LAB — MULTIPLE MYELOMA PANEL, SERUM
Albumin SerPl Elph-Mcnc: 3.4 g/dL (ref 2.9–4.4)
Albumin/Glob SerPl: 1.4 (ref 0.7–1.7)
Alpha 1: 0.3 g/dL (ref 0.0–0.4)
Alpha2 Glob SerPl Elph-Mcnc: 0.6 g/dL (ref 0.4–1.0)
B-Globulin SerPl Elph-Mcnc: 0.8 g/dL (ref 0.7–1.3)
Gamma Glob SerPl Elph-Mcnc: 0.9 g/dL (ref 0.4–1.8)
Globulin, Total: 2.5 g/dL (ref 2.2–3.9)
IgA: 225 mg/dL (ref 61–437)
IgG (Immunoglobin G), Serum: 960 mg/dL (ref 603–1613)
IgM (Immunoglobulin M), Srm: 10 mg/dL — ABNORMAL LOW (ref 15–143)
Total Protein ELP: 5.9 g/dL — ABNORMAL LOW (ref 6.0–8.5)

## 2023-12-12 ENCOUNTER — Other Ambulatory Visit: Payer: Self-pay | Admitting: Hematology and Oncology

## 2023-12-12 DIAGNOSIS — C9001 Multiple myeloma in remission: Secondary | ICD-10-CM

## 2023-12-13 ENCOUNTER — Other Ambulatory Visit: Payer: Self-pay | Admitting: *Deleted

## 2023-12-13 DIAGNOSIS — C9001 Multiple myeloma in remission: Secondary | ICD-10-CM

## 2023-12-13 MED ORDER — LENALIDOMIDE 15 MG PO CAPS: 15.0000 mg | ORAL_CAPSULE | Freq: Every day | ORAL | 0 refills | Status: DC

## 2023-12-21 ENCOUNTER — Inpatient Hospital Stay: Attending: Hematology and Oncology

## 2024-01-03 ENCOUNTER — Ambulatory Visit: Admitting: Pulmonary Disease

## 2024-01-07 ENCOUNTER — Other Ambulatory Visit: Payer: Self-pay | Admitting: Hematology and Oncology

## 2024-01-07 DIAGNOSIS — C9001 Multiple myeloma in remission: Secondary | ICD-10-CM

## 2024-01-08 ENCOUNTER — Ambulatory Visit: Payer: Self-pay | Admitting: Podiatry

## 2024-01-09 ENCOUNTER — Other Ambulatory Visit: Payer: Self-pay | Admitting: *Deleted

## 2024-01-09 DIAGNOSIS — C9001 Multiple myeloma in remission: Secondary | ICD-10-CM

## 2024-01-09 MED ORDER — LENALIDOMIDE 15 MG PO CAPS: 15.0000 mg | ORAL_CAPSULE | Freq: Every day | ORAL | 0 refills | Status: DC

## 2024-01-18 ENCOUNTER — Other Ambulatory Visit: Payer: Self-pay | Admitting: Hematology and Oncology

## 2024-01-18 ENCOUNTER — Inpatient Hospital Stay: Attending: Hematology and Oncology

## 2024-01-18 DIAGNOSIS — C9001 Multiple myeloma in remission: Secondary | ICD-10-CM | POA: Insufficient documentation

## 2024-01-18 LAB — CMP (CANCER CENTER ONLY)
ALT: 15 U/L (ref 0–44)
AST: 17 U/L (ref 15–41)
Albumin: 3.8 g/dL (ref 3.5–5.0)
Alkaline Phosphatase: 98 U/L (ref 38–126)
Anion gap: 5 (ref 5–15)
BUN: 14 mg/dL (ref 8–23)
CO2: 28 mmol/L (ref 22–32)
Calcium: 8.6 mg/dL — ABNORMAL LOW (ref 8.9–10.3)
Chloride: 107 mmol/L (ref 98–111)
Creatinine: 1.36 mg/dL — ABNORMAL HIGH (ref 0.61–1.24)
GFR, Estimated: 53 mL/min — ABNORMAL LOW (ref >60)
Glucose, Bld: 107 mg/dL — ABNORMAL HIGH (ref 70–99)
Potassium: 4 mmol/L (ref 3.5–5.1)
Sodium: 140 mmol/L (ref 135–145)
Total Bilirubin: 0.4 mg/dL (ref 0.0–1.2)
Total Protein: 6.6 g/dL (ref 6.5–8.1)

## 2024-01-18 LAB — CBC WITH DIFFERENTIAL (CANCER CENTER ONLY)
Abs Immature Granulocytes: 0.01 10*3/uL (ref 0.00–0.07)
Basophils Absolute: 0.1 10*3/uL (ref 0.0–0.1)
Basophils Relative: 2 %
Eosinophils Absolute: 0.2 10*3/uL (ref 0.0–0.5)
Eosinophils Relative: 6 %
HCT: 35.1 % — ABNORMAL LOW (ref 39.0–52.0)
Hemoglobin: 11.8 g/dL — ABNORMAL LOW (ref 13.0–17.0)
Immature Granulocytes: 0 %
Lymphocytes Relative: 24 %
Lymphs Abs: 0.9 10*3/uL (ref 0.7–4.0)
MCH: 31.1 pg (ref 26.0–34.0)
MCHC: 33.6 g/dL (ref 30.0–36.0)
MCV: 92.4 fL (ref 80.0–100.0)
Monocytes Absolute: 0.4 10*3/uL (ref 0.1–1.0)
Monocytes Relative: 12 %
Neutro Abs: 2.1 10*3/uL (ref 1.7–7.7)
Neutrophils Relative %: 56 %
Platelet Count: 179 10*3/uL (ref 150–400)
RBC: 3.8 MIL/uL — ABNORMAL LOW (ref 4.22–5.81)
RDW: 16.6 % — ABNORMAL HIGH (ref 11.5–15.5)
WBC Count: 3.7 10*3/uL — ABNORMAL LOW (ref 4.0–10.5)
nRBC: 0 % (ref 0.0–0.2)

## 2024-01-18 LAB — LACTATE DEHYDROGENASE: LDH: 131 U/L (ref 98–192)

## 2024-01-21 LAB — MULTIPLE MYELOMA PANEL, SERUM
Albumin SerPl Elph-Mcnc: 3.5 g/dL (ref 2.9–4.4)
Albumin/Glob SerPl: 1.3 (ref 0.7–1.7)
Alpha 1: 0.2 g/dL (ref 0.0–0.4)
Alpha2 Glob SerPl Elph-Mcnc: 0.7 g/dL (ref 0.4–1.0)
B-Globulin SerPl Elph-Mcnc: 0.9 g/dL (ref 0.7–1.3)
Gamma Glob SerPl Elph-Mcnc: 1 g/dL (ref 0.4–1.8)
Globulin, Total: 2.8 g/dL (ref 2.2–3.9)
IgA: 251 mg/dL (ref 61–437)
IgG (Immunoglobin G), Serum: 1013 mg/dL (ref 603–1613)
IgM (Immunoglobulin M), Srm: 9 mg/dL — ABNORMAL LOW (ref 15–143)
Total Protein ELP: 6.3 g/dL (ref 6.0–8.5)

## 2024-01-21 LAB — KAPPA/LAMBDA LIGHT CHAINS
Kappa free light chain: 47.5 mg/L — ABNORMAL HIGH (ref 3.3–19.4)
Kappa, lambda light chain ratio: 1.75 — ABNORMAL HIGH (ref 0.26–1.65)
Lambda free light chains: 27.1 mg/L — ABNORMAL HIGH (ref 5.7–26.3)

## 2024-02-04 ENCOUNTER — Other Ambulatory Visit: Payer: Self-pay | Admitting: Hematology and Oncology

## 2024-02-04 DIAGNOSIS — C9001 Multiple myeloma in remission: Secondary | ICD-10-CM

## 2024-02-05 ENCOUNTER — Other Ambulatory Visit: Payer: Self-pay | Admitting: *Deleted

## 2024-02-05 DIAGNOSIS — C9001 Multiple myeloma in remission: Secondary | ICD-10-CM

## 2024-02-05 MED ORDER — LENALIDOMIDE 15 MG PO CAPS: 15.0000 mg | ORAL_CAPSULE | Freq: Every day | ORAL | 0 refills | Status: DC

## 2024-02-11 ENCOUNTER — Ambulatory Visit: Admitting: Pulmonary Disease

## 2024-02-11 VITALS — BP 148/60 | HR 78 | Wt 189.4 lb

## 2024-02-11 DIAGNOSIS — Z8679 Personal history of other diseases of the circulatory system: Secondary | ICD-10-CM

## 2024-02-11 DIAGNOSIS — Z87891 Personal history of nicotine dependence: Secondary | ICD-10-CM

## 2024-02-11 DIAGNOSIS — Z8579 Personal history of other malignant neoplasms of lymphoid, hematopoietic and related tissues: Secondary | ICD-10-CM | POA: Diagnosis not present

## 2024-02-11 DIAGNOSIS — J449 Chronic obstructive pulmonary disease, unspecified: Secondary | ICD-10-CM

## 2024-02-11 DIAGNOSIS — Z86711 Personal history of pulmonary embolism: Secondary | ICD-10-CM | POA: Diagnosis not present

## 2024-02-11 DIAGNOSIS — G4733 Obstructive sleep apnea (adult) (pediatric): Secondary | ICD-10-CM | POA: Diagnosis not present

## 2024-02-11 DIAGNOSIS — R0602 Shortness of breath: Secondary | ICD-10-CM | POA: Diagnosis not present

## 2024-02-11 NOTE — Patient Instructions (Signed)
 Continue using your inhalers  Continue trying to stay active  Call us  with significant concerns  I will see you 6 months from here

## 2024-02-11 NOTE — Progress Notes (Signed)
 Order to get take              James [name]    [id number]    04/14/1946  Primary Care Physician:[name], Lovie, NP  Referring Physician: Bari Lovie, NP 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   amLODipine  (NORVASC ) 10 MG tablet Take 1 tablet (10 mg total) by mouth every morning.   apixaban  (ELIQUIS ) 5 MG TABS tablet Take 5 mg by mouth 2 (two) times daily.   Calcium Carb-Cholecalciferol 600-10 MG-MCG TABS Take 1 tablet by mouth 2 (two) times daily.   Calcium Carbonate-Vit D-Min (CALCIUM-VITAMIN D-MINERALS) 600-400 MG-UNIT CHEW Chew 1 tablet by mouth in the morning and at bedtime.   colestipol  (COLESTID ) 1 g tablet Take 2 g by mouth 2 (two) times daily. Noon and bedtime   Ferrous Fumarate  (HEMOCYTE - 106 MG FE) 324 (106 Fe) MG TABS tablet Take 1 tablet by mouth every morning.   finasteride  (PROSCAR ) 5 MG tablet Take 5 mg by mouth every morning.   furosemide  (LASIX ) 20 MG tablet TAKE 1 TABLET BY MOUTH EVERY DAY (Patient taking differently: Take 20 mg by mouth in the morning.)   gabapentin  (NEURONTIN ) 400 MG capsule Take 400-800 mg by mouth See admin instructions. Take 400mg  by mouth at lunch time and 800 mg by mouth at bedtime.   HYDROcodone -acetaminophen   (NORCO) 10-325 MG tablet Take 1 tablet by mouth every 6 (six) hours as needed for moderate pain or severe pain.   insulin  degludec (TRESIBA  FLEXTOUCH) 100 UNIT/ML FlexTouch Pen Inject 15 Units into the skin at bedtime.   lenalidomide  (REVLIMID ) 15 MG capsule Take 1 capsule (15 mg total) by mouth daily. Take 1 capsule daily for 21 days. None for the next 7 days   lidocaine  (LIDODERM ) 5 % Place 1 patch onto the skin daily. Remove & Discard patch within 12 hours or as directed by MD   loperamide  (IMODIUM  A-D) 2 MG tablet Take 1 tablet (2 mg total) by mouth 3 (three) times daily as needed for diarrhea or loose stools.   loperamide  (IMODIUM ) 2 MG capsule Take 1 capsule (2 mg total) by mouth 4 (four) times daily as needed for diarrhea or loose stools.   losartan  (COZAAR ) 50 MG tablet Take 1 tablet (50 mg total) by mouth every morning.   mometasone  (ELOCON ) 0.1 % cream Apply topically daily as needed to treat the eczematous otitis externa.   morphine  (MS CONTIN ) 30 MG 12 hr tablet Take 30 mg by mouth every 12 (twelve) hours.   naloxone (NARCAN) nasal spray 4 mg/0.1 mL Place into the nose.   pantoprazole  (PROTONIX ) 40 MG tablet Take 40 mg by mouth 2 (two) times daily.   pioglitazone (ACTOS) 15 MG tablet Take 15 mg  by mouth in the morning.   Potassium Chloride  ER 20 MEQ TBCR Take 1 tablet (20 mEq total) by mouth daily for 7 days.   RESTASIS  0.05 % ophthalmic emulsion Place 1 drop into both eyes 2 (two) times daily as needed for dry eyes.   Tamsulosin  HCl (FLOMAX ) 0.4 MG CAPS Take 0.4 mg by mouth daily.   tiZANidine  (ZANAFLEX ) 4 MG tablet Take 1 tablet (4 mg total) by mouth every 8 (eight) hours as needed for muscle spasms.   TRELEGY ELLIPTA  200-62.5-25 MCG/ACT AEPB Take 1 puff by mouth 2 (two) times daily. (Patient taking differently: Take 1 puff by mouth daily.)   vitamin B-12 (CYANOCOBALAMIN) 500 MCG tablet Take 500 mcg by mouth every morning.   zolpidem  (AMBIEN ) 5 MG tablet Take 1 tablet (5 mg total) by  mouth at bedtime as needed for sleep.   [DISCONTINUED] terazosin  (HYTRIN ) 5 MG capsule Take 5 mg by mouth 2 (two) times daily.    No facility-administered encounter medications on file as of 02/11/2024.    Allergies as of 02/11/2024 - Review Complete 02/11/2024  Allergen Reaction Noted   Invokana [canagliflozin] Anaphylaxis 08/29/2014   Ace inhibitors Cough 12/14/2016    Past Medical History:  Diagnosis Date   Anemia    Arthritis    BPH (benign prostatic hyperplasia)    CHF (congestive heart failure) (HCC)    Chronic pain    Diabetes mellitus    Dyspnea    GERD (gastroesophageal reflux disease)    Headache(784.0)    migraines, sinus headaches   Hepatitis    C and B-treated   HLD (hyperlipidemia)    Hypertension    Leukemia (HCC)    Lupus    Multiple myeloma (HCC)    Pneumonia 07/2011   Sleep apnea 03/2018   going for fitting on 04-29-18   Thyroid  disease    goiter   Urinary frequency     Past Surgical History:  Procedure Laterality Date   ABDOMINAL SURGERY     BACK SURGERY     x 5    BIOPSY  01/02/2020   Procedure: BIOPSY;  Surgeon: Rollin Dover, MD;  Location: [facility];  Service: Endoscopy;;   CHOLECYSTECTOMY     COLONOSCOPY     COLONOSCOPY WITH PROPOFOL  N/A 01/02/2020   Procedure: COLONOSCOPY WITH PROPOFOL ;  Surgeon: Rollin Dover, MD;  Location: [facility];  Service: Endoscopy;  Laterality: N/A;   cyst removal skull  20 years ago   ETHMOIDECTOMY  10/12/2011   Procedure: ETHMOIDECTOMY;  Surgeon: Lynwood JINNY Genet, MD;  Location: [facility];  Service: ENT;  Laterality: Bilateral;  bilateral maxillary sinus osteal enlargement, frontal sinusotomy   EYE SURGERY     bilat cataract with lens implants   HAND SURGERY     right finger   HERNIA REPAIR     LAPAROSCOPIC APPENDECTOMY N/A 11/26/2022   Procedure: APPENDECTOMY LAPAROSCOPIC;  Surgeon: Ebbie Cough, MD;  Location: [facility];  Service: General;  Laterality: N/A;   MICROLARYNGOSCOPY N/A 02/18/2021   Procedure:  MICROLARYNGOSCOPY with Excisional Biopsy;  Surgeon: Mable Lenis, MD;  Location: [facility] OR;  Service: ENT;  Laterality: N/A;   POLYPECTOMY  01/02/2020   Procedure: POLYPECTOMY;  Surgeon: Rollin Dover, MD;  Location: [facility];  Service: Endoscopy;;   ROTATOR CUFF REPAIR     bilateral   SHOULDER ARTHROSCOPY WITH ROTATOR CUFF REPAIR Left 05/18/2014   Procedure: SHOULDER ARTHROSCOPY WITH ROTATOR CUFF REPAIR;  Surgeon: Eva Elsie Herring, MD;  Location: [facility];  Service: Orthopedics;  Laterality: Left;  Left shoulder arthroscopy rotator cuff repair, subacromial decompression   sinus surgery     TONSILLECTOMY     TRIGGER FINGER RELEASE Right 05/02/2018   Procedure: RELEASE TRIGGER FINGER/A-1 PULLEY RIGHT SMALL FINGER;  Surgeon: Murrell Drivers, MD;  Location: [facility];  Service: Orthopedics;  Laterality: Right;    Family History  Problem Relation Age of Onset   Hyperlipidemia Mother    Hypertension Mother     Social History   Socioeconomic History   Marital status: Married    Spouse name: Not on file   Number of children: Not on file   Years of education: Not on file   Highest education level: Not on file  Occupational History   Not on file  Tobacco Use   Smoking status: Former    Current packs/day: 0.00    Average packs/day: 1 pack/day for 26.0 years (26.0 ttl pk-yrs)    Types: Cigarettes    Start date: 05/12/1958    Quit date: 05/12/1984    Years since quitting: 39.7   Smokeless tobacco: Never  Vaping Use   Vaping status: Not on file  Substance and Sexual Activity   Alcohol use: No   Drug use: Not Currently    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 years ago   Sexual activity: Not Currently  Other Topics Concern   Not on file  Social History Narrative   Not on file   Social Drivers of Health   Financial Resource Strain: Not on file  Food Insecurity: No Food Insecurity (11/24/2022)   Hunger Vital Sign    Worried About Running Out of  Food in the Last Year: Never true    Ran Out of Food in the Last Year: Never true  Transportation Needs: No Transportation Needs (11/24/2022)   PRAPARE - Administrator, Civil Service (Medical): No    Lack of Transportation (Non-Medical): No  Physical Activity: Not on file  Stress: Not on file  Social Connections: Not on file  Intimate Partner Violence: Not At Risk (11/24/2022)   Humiliation, Afraid, Rape, and Kick questionnaire    Fear of Current or Ex-Partner: No    Emotionally Abused: No    Physically Abused: No    Sexually Abused: No    Review of Systems  Respiratory:  Positive for apnea and shortness of breath.   Psychiatric/Behavioral:  Positive for sleep disturbance.     Vitals:   02/11/24 1100  BP: (!) 148/60  Pulse: 78  SpO2: 97%     Physical Exam Constitutional:      Appearance: Normal appearance.  HENT:     Head: Normocephalic.     Mouth/Throat:     Mouth: Mucous membranes are moist.  Eyes:     General: No scleral icterus. Cardiovascular:     Rate and Rhythm: Normal rate and regular rhythm.     Heart sounds: No murmur heard.    No friction rub.  Pulmonary:     Effort: No respiratory distress.     Breath sounds: No stridor. No wheezing or rhonchi.  Musculoskeletal:     Cervical back: No rigidity or tenderness.  Neurological:     Mental Status: He is alert.  Psychiatric:        Mood and Affect: Mood normal.    Data Reviewed: PFT reviewed with the patient today showing moderate restriction, likely combination obstruction and restriction, no significant bronchodilator response, moderate reduction in diffusing  capacity.  No previous PFT to compare current 1 with  Assessment:  Chronic obstructive pulmonary disease - Stable on Trelegy - Will continue Trelegy  History of multiple myeloma - On Revlimid   History of pulmonary embolism - On lifetime anticoagulation  Shortness of breath on exertion - Appears to be functioning well at  present  History of diastolic heart failure, coronary artery disease   Plan/Recommendations: Continue Trelegy  Continue lifelong anticoagulation  Graded activities as tolerated  Encouraged to call with significant concerns  Follow-up in 6 months   Jennet Epley MD [facility] 02/11/2024, 11:13 AM  CC: [name], Lovika, NP

## 2024-02-14 ENCOUNTER — Other Ambulatory Visit: Payer: Self-pay | Admitting: Physician Assistant

## 2024-02-14 DIAGNOSIS — C9001 Multiple myeloma in remission: Secondary | ICD-10-CM

## 2024-02-15 ENCOUNTER — Telehealth: Payer: Self-pay | Admitting: Physician Assistant

## 2024-02-15 ENCOUNTER — Inpatient Hospital Stay: Admitting: Physician Assistant

## 2024-02-15 ENCOUNTER — Inpatient Hospital Stay

## 2024-02-20 ENCOUNTER — Inpatient Hospital Stay: Attending: Hematology and Oncology

## 2024-02-20 ENCOUNTER — Inpatient Hospital Stay (HOSPITAL_BASED_OUTPATIENT_CLINIC_OR_DEPARTMENT_OTHER): Admitting: Physician Assistant

## 2024-02-20 VITALS — BP 128/56 | HR 66 | Temp 98.1°F | Resp 18 | Ht 72.0 in | Wt 189.8 lb

## 2024-02-20 DIAGNOSIS — Z79899 Other long term (current) drug therapy: Secondary | ICD-10-CM | POA: Insufficient documentation

## 2024-02-20 DIAGNOSIS — Z7961 Long term (current) use of immunomodulator: Secondary | ICD-10-CM | POA: Diagnosis not present

## 2024-02-20 DIAGNOSIS — C9001 Multiple myeloma in remission: Secondary | ICD-10-CM | POA: Insufficient documentation

## 2024-02-20 LAB — CMP (CANCER CENTER ONLY)
ALT: 13 U/L (ref 0–44)
AST: 16 U/L (ref 15–41)
Albumin: 3.8 g/dL (ref 3.5–5.0)
Alkaline Phosphatase: 106 U/L (ref 38–126)
Anion gap: 6 (ref 5–15)
BUN: 12 mg/dL (ref 8–23)
CO2: 27 mmol/L (ref 22–32)
Calcium: 9.1 mg/dL (ref 8.9–10.3)
Chloride: 105 mmol/L (ref 98–111)
Creatinine: 1.31 mg/dL — ABNORMAL HIGH (ref 0.61–1.24)
GFR, Estimated: 55 mL/min — ABNORMAL LOW (ref >60)
Glucose, Bld: 102 mg/dL — ABNORMAL HIGH (ref 70–99)
Potassium: 4.4 mmol/L (ref 3.5–5.1)
Sodium: 138 mmol/L (ref 135–145)
Total Bilirubin: 0.6 mg/dL (ref 0.0–1.2)
Total Protein: 7 g/dL (ref 6.5–8.1)

## 2024-02-20 LAB — CBC WITH DIFFERENTIAL (CANCER CENTER ONLY)
Abs Immature Granulocytes: 0.02 K/uL (ref 0.00–0.07)
Basophils Absolute: 0.1 K/uL (ref 0.0–0.1)
Basophils Relative: 2 %
Eosinophils Absolute: 0 K/uL (ref 0.0–0.5)
Eosinophils Relative: 1 %
HCT: 37.4 % — ABNORMAL LOW (ref 39.0–52.0)
Hemoglobin: 12.5 g/dL — ABNORMAL LOW (ref 13.0–17.0)
Immature Granulocytes: 0 %
Lymphocytes Relative: 24 %
Lymphs Abs: 1.1 K/uL (ref 0.7–4.0)
MCH: 31 pg (ref 26.0–34.0)
MCHC: 33.4 g/dL (ref 30.0–36.0)
MCV: 92.8 fL (ref 80.0–100.0)
Monocytes Absolute: 0.4 K/uL (ref 0.1–1.0)
Monocytes Relative: 10 %
Neutro Abs: 2.9 K/uL (ref 1.7–7.7)
Neutrophils Relative %: 63 %
Platelet Count: 226 K/uL (ref 150–400)
RBC: 4.03 MIL/uL — ABNORMAL LOW (ref 4.22–5.81)
RDW: 16.2 % — ABNORMAL HIGH (ref 11.5–15.5)
WBC Count: 4.5 K/uL (ref 4.0–10.5)
nRBC: 0 % (ref 0.0–0.2)

## 2024-02-20 NOTE — Progress Notes (Signed)
 [facility] [phone]   [phone]  PROGRESS NOTE  Patient Care Team: Bari Morgans, NP as PCP - Diedre Lonni Slain, MD as PCP - Cardiology (Cardiology)  Hematological/Oncological History # IgA Kappa Multiple Myeloma in Remission 2017: diagnosed with IgA Kappa MM 01/28/2016: Revlimid  at 25 mg daily for 21 days with dexamethasone  at 20 mg weekly. Reached CR in Jan 2018 10/2018: relapsed disease with M spike of 3.9 g/dL IgM level of 5292.  May 2020: Revlimid  15 mg daily for 21 days out of a 28 day cycle with dexamethasone  40 mg weekly  May 2021: achieved a complete response to therapy. Started maintenance Revlimid  15 mg daily restarted in June 2021 with dexamethasone  4 mg weekly  09/07/2022: transition care to Dr. Federico   Interval History:  James [name]. 78 y.o. male with medical history significant for IgA Kappa MM who presents for a follow up visit. The patient's last visit was on 11/22/2023. In the interim since the last visit he continues on Revlimid  therapy.   On exam today James [name] reports no changes to his health since the last visit.  He is tolerating Revlimid  therapy without any toxicities.  His energy levels are fairly stable and he can complete his baseline activities on his own.  He denies nausea, vomiting or abdominal pain.  He does struggle with intermittent episodes of diarrhea that he manages with antidiarrheals.  He denies easy bruising or signs of active bleeding.  He has chronic back pain that is unchanged from prior.  He denies fevers, chills, sweats, shortness of breath, chest pain or cough. A full 10 point ROS was otherwise negative.  MEDICAL HISTORY:  Past Medical History:  Diagnosis Date   Anemia    Arthritis    BPH (benign prostatic hyperplasia)    CHF (congestive heart failure) (HCC)    Chronic pain    Diabetes mellitus    Dyspnea    GERD (gastroesophageal reflux disease)    Headache(784.0)     migraines, sinus headaches   Hepatitis    C and B-treated   HLD (hyperlipidemia)    Hypertension    Leukemia (HCC)    Lupus    Multiple myeloma (HCC)    Pneumonia 07/2011   Sleep apnea 03/2018   going for fitting on 04-29-18   Thyroid  disease    goiter   Urinary frequency     SURGICAL HISTORY: Past Surgical History:  Procedure Laterality Date   ABDOMINAL SURGERY     BACK SURGERY     x 5    BIOPSY  01/02/2020   Procedure: BIOPSY;  Surgeon: Rollin Dover, MD;  Location: [facility];  Service: Endoscopy;;   CHOLECYSTECTOMY     COLONOSCOPY     COLONOSCOPY WITH PROPOFOL  N/A 01/02/2020   Procedure: COLONOSCOPY WITH PROPOFOL ;  Surgeon: Rollin Dover, MD;  Location: [facility];  Service: Endoscopy;  Laterality: N/A;   cyst removal skull  20 years ago   ETHMOIDECTOMY  10/12/2011   Procedure: ETHMOIDECTOMY;  Surgeon: James JINNY Genet, MD;  Location: [facility] OR;  Service: ENT;  Laterality: Bilateral;  bilateral maxillary sinus osteal enlargement, frontal sinusotomy   EYE SURGERY     bilat cataract with lens implants   HAND SURGERY     right finger   HERNIA REPAIR     LAPAROSCOPIC APPENDECTOMY N/A 11/26/2022   Procedure: APPENDECTOMY LAPAROSCOPIC;  Surgeon: Ebbie Cough, MD;  Location: [facility];  Service: General;  Laterality: N/A;   MICROLARYNGOSCOPY  N/A 02/18/2021   Procedure: MICROLARYNGOSCOPY with Excisional Biopsy;  Surgeon: Mable Lenis, MD;  Location: [facility] OR;  Service: ENT;  Laterality: N/A;   POLYPECTOMY  01/02/2020   Procedure: POLYPECTOMY;  Surgeon: Rollin Dover, MD;  Location: [facility];  Service: Endoscopy;;   ROTATOR CUFF REPAIR     bilateral   SHOULDER ARTHROSCOPY WITH ROTATOR CUFF REPAIR Left 05/18/2014   Procedure: SHOULDER ARTHROSCOPY WITH ROTATOR CUFF REPAIR;  Surgeon: Eva Elsie Herring, MD;  Location: [facility];  Service: Orthopedics;  Laterality: Left;  Left shoulder arthroscopy rotator cuff repair, subacromial decompression   sinus surgery      TONSILLECTOMY     TRIGGER FINGER RELEASE Right 05/02/2018   Procedure: RELEASE TRIGGER FINGER/A-1 PULLEY RIGHT SMALL FINGER;  Surgeon: Murrell Drivers, MD;  Location: [facility];  Service: Orthopedics;  Laterality: Right;    SOCIAL HISTORY: Social History   Socioeconomic History   Marital status: Married    Spouse name: Not on file   Number of children: Not on file   Years of education: Not on file   Highest education level: Not on file  Occupational History   Not on file  Tobacco Use   Smoking status: Former    Current packs/day: 0.00    Average packs/day: 1 pack/day for 26.0 years (26.0 ttl pk-yrs)    Types: Cigarettes    Start date: 05/12/1958    Quit date: 05/12/1984    Years since quitting: 39.8   Smokeless tobacco: Never  Vaping Use   Vaping status: Not on file  Substance and Sexual Activity   Alcohol use: No   Drug use: Not Currently    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 years ago   Sexual activity: Not Currently  Other Topics Concern   Not on file  Social History Narrative   Not on file   Social Drivers of Health   Financial Resource Strain: Not on file  Food Insecurity: No Food Insecurity (11/24/2022)   Hunger Vital Sign    Worried About Running Out of Food in the Last Year: Never true    Ran Out of Food in the Last Year: Never true  Transportation Needs: No Transportation Needs (11/24/2022)   PRAPARE - Administrator, Civil Service (Medical): No    Lack of Transportation (Non-Medical): No  Physical Activity: Not on file  Stress: Not on file  Social Connections: Not on file  Intimate Partner Violence: Not At Risk (11/24/2022)   Humiliation, Afraid, Rape, and Kick questionnaire    Fear of Current or Ex-Partner: No    Emotionally Abused: No    Physically Abused: No    Sexually Abused: No    FAMILY HISTORY: Family History  Problem Relation Age of Onset   Hyperlipidemia Mother    Hypertension Mother     ALLERGIES:  is  allergic to invokana [canagliflozin] and ace inhibitors.  MEDICATIONS:  Current Outpatient Medications  Medication Sig Dispense Refill   acetaminophen  (TYLENOL ) 325 MG tablet Take 3 tablets (975 mg total) by mouth every 6 (six) hours as needed.     amLODipine  (NORVASC ) 10 MG tablet Take 1 tablet (10 mg total) by mouth every morning. 30 tablet 0   apixaban  (ELIQUIS ) 5 MG TABS tablet Take 5 mg by mouth 2 (two) times daily.     Calcium Carb-Cholecalciferol 600-10 MG-MCG TABS Take 1 tablet by mouth 2 (two) times daily.     Calcium Carbonate-Vit D-Min (CALCIUM-VITAMIN D-MINERALS) 600-400  MG-UNIT CHEW Chew 1 tablet by mouth in the morning and at bedtime.     colestipol  (COLESTID ) 1 g tablet Take 2 g by mouth 2 (two) times daily. Noon and bedtime     Ferrous Fumarate  (HEMOCYTE - 106 MG FE) 324 (106 Fe) MG TABS tablet Take 1 tablet by mouth every morning.     finasteride  (PROSCAR ) 5 MG tablet Take 5 mg by mouth every morning.     furosemide  (LASIX ) 20 MG tablet TAKE 1 TABLET BY MOUTH EVERY DAY (Patient taking differently: Take 20 mg by mouth in the morning.) 90 tablet 3   gabapentin  (NEURONTIN ) 400 MG capsule Take 400-800 mg by mouth See admin instructions. Take 400mg  by mouth at lunch time and 800 mg by mouth at bedtime.     HYDROcodone -acetaminophen  (NORCO) 10-325 MG tablet Take 1 tablet by mouth every 6 (six) hours as needed for moderate pain or severe pain.     insulin  degludec (TRESIBA  FLEXTOUCH) 100 UNIT/ML FlexTouch Pen Inject 15 Units into the skin at bedtime.     lenalidomide  (REVLIMID ) 15 MG capsule Take 1 capsule (15 mg total) by mouth daily. Take 1 capsule daily for 21 days. None for the next 7 days 21 capsule 0   lidocaine  (LIDODERM ) 5 % Place 1 patch onto the skin daily. Remove & Discard patch within 12 hours or as directed by MD 30 patch 0   loperamide  (IMODIUM  A-D) 2 MG tablet Take 1 tablet (2 mg total) by mouth 3 (three) times daily as needed for diarrhea or loose stools. 20 tablet 0    loperamide  (IMODIUM ) 2 MG capsule Take 1 capsule (2 mg total) by mouth 4 (four) times daily as needed for diarrhea or loose stools. 12 capsule 0   losartan  (COZAAR ) 50 MG tablet Take 1 tablet (50 mg total) by mouth every morning. 30 tablet 0   mometasone  (ELOCON ) 0.1 % cream Apply topically daily as needed to treat the eczematous otitis externa. 15 g 3   morphine  (MS CONTIN ) 30 MG 12 hr tablet Take 30 mg by mouth every 12 (twelve) hours.     naloxone (NARCAN) nasal spray 4 mg/0.1 mL Place into the nose.     pantoprazole  (PROTONIX ) 40 MG tablet Take 40 mg by mouth 2 (two) times daily.     pioglitazone (ACTOS) 15 MG tablet Take 15 mg by mouth in the morning.     Potassium Chloride  ER 20 MEQ TBCR Take 1 tablet (20 mEq total) by mouth daily for 7 days. 7 tablet 0   RESTASIS  0.05 % ophthalmic emulsion Place 1 drop into both eyes 2 (two) times daily as needed for dry eyes.     Tamsulosin  HCl (FLOMAX ) 0.4 MG CAPS Take 0.4 mg by mouth daily.     tiZANidine  (ZANAFLEX ) 4 MG tablet Take 1 tablet (4 mg total) by mouth every 8 (eight) hours as needed for muscle spasms. 30 tablet 0   TRELEGY ELLIPTA  200-62.5-25 MCG/ACT AEPB Take 1 puff by mouth 2 (two) times daily. (Patient taking differently: Take 1 puff by mouth daily.) 60 each 0   vitamin B-12 (CYANOCOBALAMIN) 500 MCG tablet Take 500 mcg by mouth every morning.     zolpidem  (AMBIEN ) 5 MG tablet Take 1 tablet (5 mg total) by mouth at bedtime as needed for sleep. 5 tablet 0   No current facility-administered medications for this visit.    REVIEW OF SYSTEMS:   Constitutional: ( - ) fevers, ( - )  chills , ( - )  night sweats Eyes: ( - ) blurriness of vision, ( - ) double vision, ( - ) watery eyes Ears, nose, mouth, throat, and face: ( - ) mucositis, ( - ) sore throat Respiratory: ( - ) cough, ( - ) dyspnea, ( - ) wheezes Cardiovascular: ( - ) palpitation, ( - ) chest discomfort, ( - ) lower extremity swelling Gastrointestinal:  ( - ) nausea, ( - )  heartburn, ( - ) change in bowel habits Skin: ( - ) abnormal skin rashes Lymphatics: ( - ) new lymphadenopathy, ( - ) easy bruising Neurological: ( - ) numbness, ( - ) tingling, ( - ) new weaknesses Behavioral/Psych: ( - ) mood change, ( - ) new changes  All other systems were reviewed with the patient and are negative.  PHYSICAL EXAMINATION: ECOG PERFORMANCE STATUS: 1 - Symptomatic but completely ambulatory  Vitals:   02/20/24 1404  BP: (!) 128/56  Pulse: 66  Resp: 18  Temp: 98.1 F (36.7 C)  SpO2: 99%     Filed Weights   02/20/24 1404  Weight: 189 lb 12.8 oz (86.1 kg)    GENERAL: alert, no distress and comfortable SKIN: skin color, texture, turgor are normal, no rashes or significant lesions EYES: conjunctiva are pink and non-injected, sclera clear LUNGS: clear to auscultation and percussion with normal breathing effort HEART: regular rate & rhythm and no murmurs and no lower extremity edema Musculoskeletal: no cyanosis of digits and no clubbing  PSYCH: alert & oriented x 3, fluent speech NEURO: no focal motor/sensory deficits  LABORATORY DATA:  I have reviewed the data as listed    Latest Ref Rng & Units 02/20/2024    1:51 PM 01/18/2024   10:57 AM 11/22/2023   11:20 AM  CBC  WBC 4.0 - 10.5 K/uL 4.5  3.7  2.5   Hemoglobin 13.0 - 17.0 g/dL 87.4  88.1  89.1   Hematocrit 39.0 - 52.0 % 37.4  35.1  32.1   Platelets 150 - 400 K/uL 226  179  140        Latest Ref Rng & Units 01/18/2024   10:57 AM 11/22/2023   11:20 AM 10/19/2023    1:21 PM  CMP  Glucose 70 - 99 mg/dL 892  75  888   BUN 8 - 23 mg/dL 14  19  13    Creatinine 0.61 - 1.24 mg/dL 8.63  8.22  8.66   Sodium 135 - 145 mmol/L 140  139  139   Potassium 3.5 - 5.1 mmol/L 4.0  3.8  4.0   Chloride 98 - 111 mmol/L 107  106  104   CO2 22 - 32 mmol/L 28  29  28    Calcium 8.9 - 10.3 mg/dL 8.6  8.3  9.1   Total Protein 6.5 - 8.1 g/dL 6.6  6.3  7.2   Total Bilirubin 0.0 - 1.2 mg/dL 0.4  0.5  0.6   Alkaline Phos 38 -  126 U/L 98  87  91   AST 15 - 41 U/L 17  22  16    ALT 0 - 44 U/L 15  17  13      Lab Results  Component Value Date   MPROTEIN Not Observed 01/18/2024   MPROTEIN Not Observed 11/22/2023   MPROTEIN Not Observed 10/19/2023   Lab Results  Component Value Date   KPAFRELGTCHN 47.5 (H) 01/18/2024   KPAFRELGTCHN 59.3 (H) 11/22/2023   KPAFRELGTCHN 46.0 (H) 10/19/2023   LAMBDASER 27.1 (H) 01/18/2024  LAMBDASER 31.5 (H) 11/22/2023   LAMBDASER 26.5 (H) 10/19/2023   KAPLAMBRATIO 1.75 (H) 01/18/2024   KAPLAMBRATIO 1.88 (H) 11/22/2023   KAPLAMBRATIO 1.74 (H) 10/19/2023    RADIOGRAPHIC STUDIES: No results found.  ASSESSMENT & PLAN James Krameris a 78 y.o. male with medical history significant for IgA Kappa MM who presents for a follow up visit.  # IgA Kappa Multiple Myeloma in Remission -- Currently on Revlimid  15 mg p.o. daily 21 days on and 7 days off. -- Most recent myeloma labs from 01/18/2024 show that M protein is undetectable, overall stable kappa light chain measuring 47.5. -- Labs today show white blood cell count 4.5, hemoglobin 12.5, MCV 92.8, and platelets of 226.  Creatinine stable at 1.31. LFTs normal. Myeloma labs pending today  -- Recommend labs every 4 weeks with return to clinic in 3 months time.  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 -- After 6 months can consider decreasing down to Eliquis  2.5 mg twice daily.  Would recommend continuation of this as long as the patient is on Revlimid  therapy.  #Chronic low back pain: --S/p multiple procedures including right laminectomy at L4-5 and left laminectomy at L5-S1.  --Most recent MRI lumbar spine from 06/13/2023 showed no acute changes. Moderately severe to severe central canal and bilateral foraminal narrowing at L3-4.Moderate tomoderately severe foraminal narrowing at L4-5 is worse on the right and unchanged. Remote compression fractures   No orders of the  defined types were placed in this encounter.   All questions were answered. The patient knows to call the clinic with any problems, questions or concerns.  A total of more than 30 minutes were spent on this encounter with face-to-face time and non-face-to-face time, including preparing to see the patient, ordering tests and/or medications, counseling the patient and coordination of care as outlined above.   Johnston Police PA-C Dept of Hematology and Oncology [facility] Phone: [phone]   02/20/2024 2:07 PM

## 2024-02-21 LAB — KAPPA/LAMBDA LIGHT CHAINS
Kappa free light chain: 46.8 mg/L — ABNORMAL HIGH (ref 3.3–19.4)
Kappa, lambda light chain ratio: 1.69 — ABNORMAL HIGH (ref 0.26–1.65)
Lambda free light chains: 27.7 mg/L — ABNORMAL HIGH (ref 5.7–26.3)

## 2024-02-25 LAB — MULTIPLE MYELOMA PANEL, SERUM
Albumin SerPl Elph-Mcnc: 3.7 g/dL (ref 2.9–4.4)
Albumin/Glob SerPl: 1.2 (ref 0.7–1.7)
Alpha 1: 0.2 g/dL (ref 0.0–0.4)
Alpha2 Glob SerPl Elph-Mcnc: 0.8 g/dL (ref 0.4–1.0)
B-Globulin SerPl Elph-Mcnc: 1.1 g/dL (ref 0.7–1.3)
Gamma Glob SerPl Elph-Mcnc: 1 g/dL (ref 0.4–1.8)
Globulin, Total: 3.1 g/dL (ref 2.2–3.9)
IgA: 282 mg/dL (ref 61–437)
IgG (Immunoglobin G), Serum: 1127 mg/dL (ref 603–1613)
IgM (Immunoglobulin M), Srm: 11 mg/dL — ABNORMAL LOW (ref 15–143)
Total Protein ELP: 6.8 g/dL (ref 6.0–8.5)

## 2024-03-05 ENCOUNTER — Other Ambulatory Visit: Payer: Self-pay | Admitting: Hematology and Oncology

## 2024-03-05 DIAGNOSIS — C9001 Multiple myeloma in remission: Secondary | ICD-10-CM

## 2024-03-06 ENCOUNTER — Other Ambulatory Visit: Payer: Self-pay | Admitting: *Deleted

## 2024-03-06 DIAGNOSIS — C9001 Multiple myeloma in remission: Secondary | ICD-10-CM

## 2024-03-06 MED ORDER — LENALIDOMIDE 15 MG PO CAPS: 15.0000 mg | ORAL_CAPSULE | Freq: Every day | ORAL | 0 refills | Status: DC

## 2024-03-14 ENCOUNTER — Inpatient Hospital Stay: Attending: Hematology and Oncology

## 2024-03-14 DIAGNOSIS — C9001 Multiple myeloma in remission: Secondary | ICD-10-CM | POA: Insufficient documentation

## 2024-03-17 ENCOUNTER — Telehealth: Payer: Self-pay | Admitting: Hematology and Oncology

## 2024-03-17 ENCOUNTER — Inpatient Hospital Stay (HOSPITAL_BASED_OUTPATIENT_CLINIC_OR_DEPARTMENT_OTHER)

## 2024-03-17 ENCOUNTER — Other Ambulatory Visit: Payer: Self-pay | Admitting: *Deleted

## 2024-03-17 DIAGNOSIS — C9001 Multiple myeloma in remission: Secondary | ICD-10-CM

## 2024-03-17 LAB — CBC WITH DIFFERENTIAL (CANCER CENTER ONLY)
Abs Immature Granulocytes: 0.01 K/uL (ref 0.00–0.07)
Basophils Absolute: 0.1 K/uL (ref 0.0–0.1)
Basophils Relative: 2 %
Eosinophils Absolute: 0.2 K/uL (ref 0.0–0.5)
Eosinophils Relative: 3 %
HCT: 37.6 % — ABNORMAL LOW (ref 39.0–52.0)
Hemoglobin: 12.7 g/dL — ABNORMAL LOW (ref 13.0–17.0)
Immature Granulocytes: 0 %
Lymphocytes Relative: 26 %
Lymphs Abs: 1.3 K/uL (ref 0.7–4.0)
MCH: 31.5 pg (ref 26.0–34.0)
MCHC: 33.8 g/dL (ref 30.0–36.0)
MCV: 93.3 fL (ref 80.0–100.0)
Monocytes Absolute: 0.5 K/uL (ref 0.1–1.0)
Monocytes Relative: 10 %
Neutro Abs: 2.8 K/uL (ref 1.7–7.7)
Neutrophils Relative %: 59 %
Platelet Count: 187 K/uL (ref 150–400)
RBC: 4.03 MIL/uL — ABNORMAL LOW (ref 4.22–5.81)
RDW: 16.4 % — ABNORMAL HIGH (ref 11.5–15.5)
WBC Count: 4.7 K/uL (ref 4.0–10.5)
nRBC: 0 % (ref 0.0–0.2)

## 2024-03-17 LAB — CMP (CANCER CENTER ONLY)
ALT: 14 U/L (ref 0–44)
AST: 17 U/L (ref 15–41)
Albumin: 4 g/dL (ref 3.5–5.0)
Alkaline Phosphatase: 108 U/L (ref 38–126)
Anion gap: 6 (ref 5–15)
BUN: 11 mg/dL (ref 8–23)
CO2: 28 mmol/L (ref 22–32)
Calcium: 8.6 mg/dL — ABNORMAL LOW (ref 8.9–10.3)
Chloride: 105 mmol/L (ref 98–111)
Creatinine: 1.38 mg/dL — ABNORMAL HIGH (ref 0.61–1.24)
GFR, Estimated: 52 mL/min — ABNORMAL LOW (ref >60)
Glucose, Bld: 102 mg/dL — ABNORMAL HIGH (ref 70–99)
Potassium: 4.2 mmol/L (ref 3.5–5.1)
Sodium: 139 mmol/L (ref 135–145)
Total Bilirubin: 0.5 mg/dL (ref 0.0–1.2)
Total Protein: 7 g/dL (ref 6.5–8.1)

## 2024-03-17 LAB — LACTATE DEHYDROGENASE: LDH: 134 U/L (ref 98–192)

## 2024-03-18 LAB — KAPPA/LAMBDA LIGHT CHAINS
Kappa free light chain: 50.1 mg/L — ABNORMAL HIGH (ref 3.3–19.4)
Kappa, lambda light chain ratio: 1.65 (ref 0.26–1.65)
Lambda free light chains: 30.3 mg/L — ABNORMAL HIGH (ref 5.7–26.3)

## 2024-03-21 LAB — MULTIPLE MYELOMA PANEL, SERUM
Albumin SerPl Elph-Mcnc: 3.7 g/dL (ref 2.9–4.4)
Albumin/Glob SerPl: 1.4 (ref 0.7–1.7)
Alpha 1: 0.2 g/dL (ref 0.0–0.4)
Alpha2 Glob SerPl Elph-Mcnc: 0.7 g/dL (ref 0.4–1.0)
B-Globulin SerPl Elph-Mcnc: 0.9 g/dL (ref 0.7–1.3)
Gamma Glob SerPl Elph-Mcnc: 0.9 g/dL (ref 0.4–1.8)
Globulin, Total: 2.7 g/dL (ref 2.2–3.9)
IgA: 285 mg/dL (ref 61–437)
IgG (Immunoglobin G), Serum: 1102 mg/dL (ref 603–1613)
IgM (Immunoglobulin M), Srm: 12 mg/dL — ABNORMAL LOW (ref 15–143)
Total Protein ELP: 6.4 g/dL (ref 6.0–8.5)

## 2024-04-01 ENCOUNTER — Other Ambulatory Visit: Payer: Self-pay | Admitting: Hematology and Oncology

## 2024-04-01 DIAGNOSIS — C9001 Multiple myeloma in remission: Secondary | ICD-10-CM

## 2024-04-02 ENCOUNTER — Other Ambulatory Visit: Payer: Self-pay

## 2024-04-02 ENCOUNTER — Emergency Department (HOSPITAL_COMMUNITY)
Admission: EM | Admit: 2024-04-02 | Discharge: 2024-04-02 | Disposition: A | Discharge status: Home / Self Care | Attending: Emergency Medicine | Admitting: Emergency Medicine

## 2024-04-02 ENCOUNTER — Other Ambulatory Visit: Payer: Self-pay | Admitting: *Deleted

## 2024-04-02 DIAGNOSIS — Z8579 Personal history of other malignant neoplasms of lymphoid, hematopoietic and related tissues: Secondary | ICD-10-CM | POA: Diagnosis not present

## 2024-04-02 DIAGNOSIS — Z79899 Other long term (current) drug therapy: Secondary | ICD-10-CM | POA: Diagnosis not present

## 2024-04-02 DIAGNOSIS — C9001 Multiple myeloma in remission: Secondary | ICD-10-CM

## 2024-04-02 DIAGNOSIS — R600 Localized edema: Secondary | ICD-10-CM | POA: Diagnosis not present

## 2024-04-02 DIAGNOSIS — Z794 Long term (current) use of insulin: Secondary | ICD-10-CM | POA: Diagnosis not present

## 2024-04-02 DIAGNOSIS — Z7901 Long term (current) use of anticoagulants: Secondary | ICD-10-CM | POA: Diagnosis not present

## 2024-04-02 DIAGNOSIS — G893 Neoplasm related pain (acute) (chronic): Secondary | ICD-10-CM | POA: Diagnosis not present

## 2024-04-02 DIAGNOSIS — M542 Cervicalgia: Secondary | ICD-10-CM | POA: Diagnosis present

## 2024-04-02 MED ORDER — MORPHINE SULFATE (PF) 4 MG/ML IV SOLN
4.0000 mg | Freq: Once | INTRAVENOUS | Status: AC
  Administered 2024-04-02: 4 mg via INTRAMUSCULAR
  Filled 2024-04-02: qty 1

## 2024-04-02 MED ORDER — OXYCODONE HCL 5 MG PO TABS: 5.0000 mg | ORAL_TABLET | Freq: Once | ORAL | Status: DC

## 2024-04-02 MED ORDER — HYDROCODONE-ACETAMINOPHEN 10-325 MG PO TABS
2.0000 | ORAL_TABLET | Freq: Once | ORAL | Status: AC
  Administered 2024-04-02: 2 via ORAL
  Filled 2024-04-02: qty 2

## 2024-04-02 MED ORDER — LENALIDOMIDE 15 MG PO CAPS: 15.0000 mg | ORAL_CAPSULE | Freq: Every day | ORAL | 0 refills | Status: DC

## 2024-04-02 MED ORDER — MORPHINE SULFATE ER 15 MG PO TBCR
15.0000 mg | EXTENDED_RELEASE_TABLET | Freq: Two times a day (BID) | ORAL | Status: DC
  Administered 2024-04-02: 15 mg via ORAL
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
## 2024-04-11 ENCOUNTER — Inpatient Hospital Stay: Attending: Hematology and Oncology

## 2024-04-11 ENCOUNTER — Other Ambulatory Visit: Payer: Self-pay | Admitting: Hematology and Oncology

## 2024-04-11 DIAGNOSIS — C9001 Multiple myeloma in remission: Secondary | ICD-10-CM | POA: Insufficient documentation

## 2024-04-11 DIAGNOSIS — Z79899 Other long term (current) drug therapy: Secondary | ICD-10-CM | POA: Diagnosis not present

## 2024-04-11 LAB — CMP (CANCER CENTER ONLY)
ALT: 16 U/L (ref 0–44)
AST: 14 U/L — ABNORMAL LOW (ref 15–41)
Albumin: 3.9 g/dL (ref 3.5–5.0)
Alkaline Phosphatase: 106 U/L (ref 38–126)
Anion gap: 6 (ref 5–15)
BUN: 14 mg/dL (ref 8–23)
CO2: 27 mmol/L (ref 22–32)
Calcium: 8.9 mg/dL (ref 8.9–10.3)
Chloride: 104 mmol/L (ref 98–111)
Creatinine: 1.29 mg/dL — ABNORMAL HIGH (ref 0.61–1.24)
GFR, Estimated: 56 mL/min — ABNORMAL LOW (ref >60)
Glucose, Bld: 125 mg/dL — ABNORMAL HIGH (ref 70–99)
Potassium: 3.9 mmol/L (ref 3.5–5.1)
Sodium: 137 mmol/L (ref 135–145)
Total Bilirubin: 0.5 mg/dL (ref 0.0–1.2)
Total Protein: 6.9 g/dL (ref 6.5–8.1)

## 2024-04-11 LAB — CBC WITH DIFFERENTIAL (CANCER CENTER ONLY)
Abs Immature Granulocytes: 0.01 K/uL (ref 0.00–0.07)
Basophils Absolute: 0 K/uL (ref 0.0–0.1)
Basophils Relative: 1 %
Eosinophils Absolute: 0 K/uL (ref 0.0–0.5)
Eosinophils Relative: 1 %
HCT: 35.4 % — ABNORMAL LOW (ref 39.0–52.0)
Hemoglobin: 11.9 g/dL — ABNORMAL LOW (ref 13.0–17.0)
Immature Granulocytes: 0 %
Lymphocytes Relative: 23 %
Lymphs Abs: 0.9 K/uL (ref 0.7–4.0)
MCH: 30.4 pg (ref 26.0–34.0)
MCHC: 33.6 g/dL (ref 30.0–36.0)
MCV: 90.5 fL (ref 80.0–100.0)
Monocytes Absolute: 0.4 K/uL (ref 0.1–1.0)
Monocytes Relative: 10 %
Neutro Abs: 2.7 K/uL (ref 1.7–7.7)
Neutrophils Relative %: 65 %
Platelet Count: 189 K/uL (ref 150–400)
RBC: 3.91 MIL/uL — ABNORMAL LOW (ref 4.22–5.81)
RDW: 16 % — ABNORMAL HIGH (ref 11.5–15.5)
WBC Count: 4.1 K/uL (ref 4.0–10.5)
nRBC: 0 % (ref 0.0–0.2)

## 2024-04-11 LAB — LACTATE DEHYDROGENASE: LDH: 127 U/L (ref 98–192)

## 2024-04-14 LAB — KAPPA/LAMBDA LIGHT CHAINS
Kappa free light chain: 46 mg/L — ABNORMAL HIGH (ref 3.3–19.4)
Kappa, lambda light chain ratio: 1.76 — ABNORMAL HIGH (ref 0.26–1.65)
Lambda free light chains: 26.2 mg/L (ref 5.7–26.3)

## 2024-04-15 LAB — MULTIPLE MYELOMA PANEL, SERUM
Albumin SerPl Elph-Mcnc: 3.2 g/dL (ref 2.9–4.4)
Albumin/Glob SerPl: 1.2 (ref 0.7–1.7)
Alpha 1: 0.3 g/dL (ref 0.0–0.4)
Alpha2 Glob SerPl Elph-Mcnc: 0.7 g/dL (ref 0.4–1.0)
B-Globulin SerPl Elph-Mcnc: 0.9 g/dL (ref 0.7–1.3)
Gamma Glob SerPl Elph-Mcnc: 1 g/dL (ref 0.4–1.8)
Globulin, Total: 2.9 g/dL (ref 2.2–3.9)
IgA: 284 mg/dL (ref 61–437)
IgG (Immunoglobin G), Serum: 1077 mg/dL (ref 603–1613)
IgM (Immunoglobulin M), Srm: 12 mg/dL — ABNORMAL LOW (ref 15–143)
Total Protein ELP: 6.1 g/dL (ref 6.0–8.5)

## 2024-04-29 ENCOUNTER — Other Ambulatory Visit: Payer: Self-pay | Admitting: Hematology and Oncology

## 2024-04-29 DIAGNOSIS — C9001 Multiple myeloma in remission: Secondary | ICD-10-CM

## 2024-04-30 ENCOUNTER — Other Ambulatory Visit: Payer: Self-pay | Admitting: *Deleted

## 2024-04-30 DIAGNOSIS — C9001 Multiple myeloma in remission: Secondary | ICD-10-CM

## 2024-04-30 MED ORDER — LENALIDOMIDE 15 MG PO CAPS: 15.0000 mg | ORAL_CAPSULE | Freq: Every day | ORAL | 0 refills | Status: DC

## 2024-05-01 ENCOUNTER — Encounter: Payer: Self-pay | Admitting: Pulmonary Disease

## 2024-05-01 ENCOUNTER — Ambulatory Visit: Admitting: Pulmonary Disease

## 2024-05-01 ENCOUNTER — Ambulatory Visit: Payer: Self-pay | Admitting: Pulmonary Disease

## 2024-05-01 VITALS — BP 131/67 | HR 58 | Ht 66.0 in | Wt 189.0 lb

## 2024-05-01 DIAGNOSIS — C9000 Multiple myeloma not having achieved remission: Secondary | ICD-10-CM | POA: Diagnosis not present

## 2024-05-01 DIAGNOSIS — J441 Chronic obstructive pulmonary disease with (acute) exacerbation: Secondary | ICD-10-CM | POA: Diagnosis not present

## 2024-05-01 DIAGNOSIS — X088XXA Exposure to other specified smoke, fire and flames, initial encounter: Secondary | ICD-10-CM | POA: Diagnosis not present

## 2024-05-01 DIAGNOSIS — J449 Chronic obstructive pulmonary disease, unspecified: Secondary | ICD-10-CM

## 2024-05-01 MED ORDER — PREDNISONE 10 MG PO TABS: 40.0000 mg | ORAL_TABLET | Freq: Every day | ORAL | 0 refills | Status: DC

## 2024-05-01 MED ORDER — DOXYCYCLINE HYCLATE 100 MG PO TABS: 100.0000 mg | ORAL_TABLET | Freq: Two times a day (BID) | ORAL | 0 refills | Status: DC
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
## 2024-05-01 NOTE — Patient Instructions (Signed)
 Continue Trelegy ellipta  1 puff daily - rinse mouth out after each use  Ues albuterol  inhaler as needed  Start prednisone 40mg  daily for 5 days  Start doxycycline 1 tab twice daily for 7 days  Follow up as scheduled

## 2024-05-02 ENCOUNTER — Encounter: Payer: Self-pay | Admitting: Pulmonary Disease

## 2024-05-02 NOTE — Telephone Encounter (Signed)
 Was seen by Dr.Dewald yesterday

## 2024-05-09 ENCOUNTER — Inpatient Hospital Stay: Admitting: Hematology and Oncology

## 2024-05-09 ENCOUNTER — Inpatient Hospital Stay: Attending: Hematology and Oncology

## 2024-05-09 VITALS — BP 128/62 | HR 42 | Temp 97.6°F | Resp 16 | Wt 193.0 lb

## 2024-05-09 DIAGNOSIS — Z87891 Personal history of nicotine dependence: Secondary | ICD-10-CM | POA: Insufficient documentation

## 2024-05-09 DIAGNOSIS — C9001 Multiple myeloma in remission: Secondary | ICD-10-CM | POA: Diagnosis present

## 2024-05-09 DIAGNOSIS — E119 Type 2 diabetes mellitus without complications: Secondary | ICD-10-CM | POA: Diagnosis not present

## 2024-05-09 DIAGNOSIS — Z7901 Long term (current) use of anticoagulants: Secondary | ICD-10-CM | POA: Insufficient documentation

## 2024-05-09 DIAGNOSIS — I110 Hypertensive heart disease with heart failure: Secondary | ICD-10-CM | POA: Insufficient documentation

## 2024-05-09 DIAGNOSIS — N400 Enlarged prostate without lower urinary tract symptoms: Secondary | ICD-10-CM | POA: Insufficient documentation

## 2024-05-09 DIAGNOSIS — G8929 Other chronic pain: Secondary | ICD-10-CM | POA: Diagnosis not present

## 2024-05-09 DIAGNOSIS — M1990 Unspecified osteoarthritis, unspecified site: Secondary | ICD-10-CM | POA: Insufficient documentation

## 2024-05-09 DIAGNOSIS — Z7984 Long term (current) use of oral hypoglycemic drugs: Secondary | ICD-10-CM | POA: Diagnosis not present

## 2024-05-09 DIAGNOSIS — Z7961 Long term (current) use of immunomodulator: Secondary | ICD-10-CM | POA: Diagnosis not present

## 2024-05-09 DIAGNOSIS — Z86711 Personal history of pulmonary embolism: Secondary | ICD-10-CM | POA: Insufficient documentation

## 2024-05-09 DIAGNOSIS — E785 Hyperlipidemia, unspecified: Secondary | ICD-10-CM | POA: Diagnosis not present

## 2024-05-09 DIAGNOSIS — Z7952 Long term (current) use of systemic steroids: Secondary | ICD-10-CM | POA: Insufficient documentation

## 2024-05-09 DIAGNOSIS — R001 Bradycardia, unspecified: Secondary | ICD-10-CM | POA: Diagnosis not present

## 2024-05-09 DIAGNOSIS — Z79899 Other long term (current) drug therapy: Secondary | ICD-10-CM | POA: Insufficient documentation

## 2024-05-09 DIAGNOSIS — G4730 Sleep apnea, unspecified: Secondary | ICD-10-CM | POA: Insufficient documentation

## 2024-05-09 DIAGNOSIS — I509 Heart failure, unspecified: Secondary | ICD-10-CM | POA: Diagnosis not present

## 2024-05-09 DIAGNOSIS — Z794 Long term (current) use of insulin: Secondary | ICD-10-CM | POA: Insufficient documentation

## 2024-05-09 LAB — CMP (CANCER CENTER ONLY)
ALT: 28 U/L (ref 0–44)
AST: 26 U/L (ref 15–41)
Albumin: 3.8 g/dL (ref 3.5–5.0)
Alkaline Phosphatase: 111 U/L (ref 38–126)
Anion gap: 6 (ref 5–15)
BUN: 9 mg/dL (ref 8–23)
CO2: 29 mmol/L (ref 22–32)
Calcium: 8.9 mg/dL (ref 8.9–10.3)
Chloride: 103 mmol/L (ref 98–111)
Creatinine: 1.12 mg/dL (ref 0.61–1.24)
GFR, Estimated: >60 mL/min (ref >60)
Glucose, Bld: 148 mg/dL — ABNORMAL HIGH (ref 70–99)
Potassium: 3.5 mmol/L (ref 3.5–5.1)
Sodium: 138 mmol/L (ref 135–145)
Total Bilirubin: 0.5 mg/dL (ref 0.0–1.2)
Total Protein: 6.6 g/dL (ref 6.5–8.1)

## 2024-05-09 LAB — CBC WITH DIFFERENTIAL (CANCER CENTER ONLY)
Abs Immature Granulocytes: 0.02 K/uL (ref 0.00–0.07)
Basophils Absolute: 0.1 K/uL (ref 0.0–0.1)
Basophils Relative: 2 %
Eosinophils Absolute: 0.3 K/uL (ref 0.0–0.5)
Eosinophils Relative: 8 %
HCT: 34 % — ABNORMAL LOW (ref 39.0–52.0)
Hemoglobin: 11.6 g/dL — ABNORMAL LOW (ref 13.0–17.0)
Immature Granulocytes: 1 %
Lymphocytes Relative: 26 %
Lymphs Abs: 0.8 K/uL (ref 0.7–4.0)
MCH: 31.1 pg (ref 26.0–34.0)
MCHC: 34.1 g/dL (ref 30.0–36.0)
MCV: 91.2 fL (ref 80.0–100.0)
Monocytes Absolute: 0.3 K/uL (ref 0.1–1.0)
Monocytes Relative: 9 %
Neutro Abs: 1.8 K/uL (ref 1.7–7.7)
Neutrophils Relative %: 54 %
Platelet Count: 193 K/uL (ref 150–400)
RBC: 3.73 MIL/uL — ABNORMAL LOW (ref 4.22–5.81)
RDW: 17 % — ABNORMAL HIGH (ref 11.5–15.5)
WBC Count: 3.2 K/uL — ABNORMAL LOW (ref 4.0–10.5)
nRBC: 0 % (ref 0.0–0.2)

## 2024-05-09 LAB — LACTATE DEHYDROGENASE: LDH: 153 U/L (ref 98–192)
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
## 2024-05-09 NOTE — Progress Notes (Signed)
 Rechecked pt's puslee. Radial pulse was 52 and very irregular. Pt c/o intermittent chest pain. Pain is not sustained. It seems to come and go. No SOB, no diaphoresis. EKG done. Dr. Federico given results. Dr. Federico reviewed EKG. No change from baseline EKG from 1 year ago. Will fax EKG to his PCP on 05/12/24 as their office is closed today.

## 2024-05-12 LAB — KAPPA/LAMBDA LIGHT CHAINS
Kappa free light chain: 46.3 mg/L — ABNORMAL HIGH (ref 3.3–19.4)
Kappa, lambda light chain ratio: 1.59 (ref 0.26–1.65)
Lambda free light chains: 29.1 mg/L — ABNORMAL HIGH (ref 5.7–26.3)

## 2024-05-14 LAB — MULTIPLE MYELOMA PANEL, SERUM
Albumin SerPl Elph-Mcnc: 3.4 g/dL (ref 2.9–4.4)
Albumin/Glob SerPl: 1.3 (ref 0.7–1.7)
Alpha 1: 0.3 g/dL (ref 0.0–0.4)
Alpha2 Glob SerPl Elph-Mcnc: 0.7 g/dL (ref 0.4–1.0)
B-Globulin SerPl Elph-Mcnc: 0.9 g/dL (ref 0.7–1.3)
Gamma Glob SerPl Elph-Mcnc: 1 g/dL (ref 0.4–1.8)
Globulin, Total: 2.8 g/dL (ref 2.2–3.9)
IgA: 277 mg/dL (ref 61–437)
IgG (Immunoglobin G), Serum: 1128 mg/dL (ref 603–1613)
IgM (Immunoglobulin M), Srm: 11 mg/dL — ABNORMAL LOW (ref 15–143)
Total Protein ELP: 6.2 g/dL (ref 6.0–8.5)

## 2024-05-20 DIAGNOSIS — R200 Anesthesia of skin: Secondary | ICD-10-CM | POA: Insufficient documentation

## 2024-05-22 ENCOUNTER — Encounter (HOSPITAL_BASED_OUTPATIENT_CLINIC_OR_DEPARTMENT_OTHER): Attending: General Surgery | Admitting: General Surgery

## 2024-05-22 DIAGNOSIS — E11622 Type 2 diabetes mellitus with other skin ulcer: Secondary | ICD-10-CM | POA: Insufficient documentation

## 2024-05-22 DIAGNOSIS — L859 Epidermal thickening, unspecified: Secondary | ICD-10-CM | POA: Insufficient documentation

## 2024-05-22 DIAGNOSIS — L97819 Non-pressure chronic ulcer of other part of right lower leg with unspecified severity: Secondary | ICD-10-CM | POA: Diagnosis present

## 2024-05-22 DIAGNOSIS — L97829 Non-pressure chronic ulcer of other part of left lower leg with unspecified severity: Secondary | ICD-10-CM | POA: Insufficient documentation

## 2024-05-22 DIAGNOSIS — I5032 Chronic diastolic (congestive) heart failure: Secondary | ICD-10-CM | POA: Diagnosis not present

## 2024-05-22 DIAGNOSIS — C9000 Multiple myeloma not having achieved remission: Secondary | ICD-10-CM | POA: Diagnosis not present

## 2024-05-27 ENCOUNTER — Other Ambulatory Visit: Payer: Self-pay | Admitting: Hematology and Oncology

## 2024-05-27 DIAGNOSIS — C9001 Multiple myeloma in remission: Secondary | ICD-10-CM

## 2024-05-28 ENCOUNTER — Other Ambulatory Visit: Payer: Self-pay | Admitting: *Deleted

## 2024-05-28 DIAGNOSIS — C9001 Multiple myeloma in remission: Secondary | ICD-10-CM

## 2024-05-28 MED ORDER — LENALIDOMIDE 15 MG PO CAPS: 15.0000 mg | ORAL_CAPSULE | Freq: Every day | ORAL | 0 refills | Status: DC

## 2024-05-30 ENCOUNTER — Encounter (HOSPITAL_BASED_OUTPATIENT_CLINIC_OR_DEPARTMENT_OTHER): Admitting: General Surgery

## 2024-05-30 DIAGNOSIS — L97819 Non-pressure chronic ulcer of other part of right lower leg with unspecified severity: Secondary | ICD-10-CM | POA: Diagnosis not present

## 2024-06-03 ENCOUNTER — Inpatient Hospital Stay: Attending: Hematology and Oncology

## 2024-06-03 DIAGNOSIS — C9001 Multiple myeloma in remission: Secondary | ICD-10-CM

## 2024-06-03 LAB — CMP (CANCER CENTER ONLY)
ALT: 15 U/L (ref 0–44)
AST: 18 U/L (ref 15–41)
Albumin: 3.9 g/dL (ref 3.5–5.0)
Alkaline Phosphatase: 111 U/L (ref 38–126)
Anion gap: 6 (ref 5–15)
BUN: 16 mg/dL (ref 8–23)
CO2: 27 mmol/L (ref 22–32)
Calcium: 8.7 mg/dL — ABNORMAL LOW (ref 8.9–10.3)
Chloride: 106 mmol/L (ref 98–111)
Creatinine: 1.62 mg/dL — ABNORMAL HIGH (ref 0.61–1.24)
GFR, Estimated: 43 mL/min — ABNORMAL LOW (ref >60)
Glucose, Bld: 106 mg/dL — ABNORMAL HIGH (ref 70–99)
Potassium: 4.5 mmol/L (ref 3.5–5.1)
Sodium: 139 mmol/L (ref 135–145)
Total Bilirubin: 0.6 mg/dL (ref 0.0–1.2)
Total Protein: 6.8 g/dL (ref 6.5–8.1)

## 2024-06-03 LAB — CBC WITH DIFFERENTIAL (CANCER CENTER ONLY)
Abs Immature Granulocytes: 0.02 K/uL (ref 0.00–0.07)
Basophils Absolute: 0 K/uL (ref 0.0–0.1)
Basophils Relative: 1 %
Eosinophils Absolute: 0.3 K/uL (ref 0.0–0.5)
Eosinophils Relative: 7 %
HCT: 36.7 % — ABNORMAL LOW (ref 39.0–52.0)
Hemoglobin: 12.4 g/dL — ABNORMAL LOW (ref 13.0–17.0)
Immature Granulocytes: 0 %
Lymphocytes Relative: 23 %
Lymphs Abs: 1.1 K/uL (ref 0.7–4.0)
MCH: 31.6 pg (ref 26.0–34.0)
MCHC: 33.8 g/dL (ref 30.0–36.0)
MCV: 93.4 fL (ref 80.0–100.0)
Monocytes Absolute: 0.4 K/uL (ref 0.1–1.0)
Monocytes Relative: 9 %
Neutro Abs: 2.8 K/uL (ref 1.7–7.7)
Neutrophils Relative %: 60 %
Platelet Count: 153 K/uL (ref 150–400)
RBC: 3.93 MIL/uL — ABNORMAL LOW (ref 4.22–5.81)
RDW: 17.5 % — ABNORMAL HIGH (ref 11.5–15.5)
WBC Count: 4.6 K/uL (ref 4.0–10.5)
nRBC: 0 % (ref 0.0–0.2)

## 2024-06-03 LAB — LACTATE DEHYDROGENASE: LDH: 150 U/L (ref 98–192)

## 2024-06-04 LAB — KAPPA/LAMBDA LIGHT CHAINS
Kappa free light chain: 51.7 mg/L — ABNORMAL HIGH (ref 3.3–19.4)
Kappa, lambda light chain ratio: 1.71 — ABNORMAL HIGH (ref 0.26–1.65)
Lambda free light chains: 30.2 mg/L — ABNORMAL HIGH (ref 5.7–26.3)

## 2024-06-06 ENCOUNTER — Encounter (HOSPITAL_BASED_OUTPATIENT_CLINIC_OR_DEPARTMENT_OTHER): Attending: General Surgery | Admitting: General Surgery

## 2024-06-06 DIAGNOSIS — L859 Epidermal thickening, unspecified: Secondary | ICD-10-CM | POA: Insufficient documentation

## 2024-06-06 DIAGNOSIS — C9000 Multiple myeloma not having achieved remission: Secondary | ICD-10-CM | POA: Insufficient documentation

## 2024-06-06 DIAGNOSIS — L97819 Non-pressure chronic ulcer of other part of right lower leg with unspecified severity: Secondary | ICD-10-CM | POA: Insufficient documentation

## 2024-06-06 DIAGNOSIS — L930 Discoid lupus erythematosus: Secondary | ICD-10-CM | POA: Insufficient documentation

## 2024-06-06 DIAGNOSIS — I5032 Chronic diastolic (congestive) heart failure: Secondary | ICD-10-CM | POA: Insufficient documentation

## 2024-06-06 DIAGNOSIS — E11622 Type 2 diabetes mellitus with other skin ulcer: Secondary | ICD-10-CM | POA: Insufficient documentation

## 2024-06-09 LAB — MULTIPLE MYELOMA PANEL, SERUM
Albumin SerPl Elph-Mcnc: 3.8 g/dL (ref 2.9–4.4)
Albumin/Glob SerPl: 1.5 (ref 0.7–1.7)
Alpha 1: 0.2 g/dL (ref 0.0–0.4)
Alpha2 Glob SerPl Elph-Mcnc: 0.7 g/dL (ref 0.4–1.0)
B-Globulin SerPl Elph-Mcnc: 1 g/dL (ref 0.7–1.3)
Gamma Glob SerPl Elph-Mcnc: 0.8 g/dL (ref 0.4–1.8)
Globulin, Total: 2.6 g/dL (ref 2.2–3.9)
IgA: 251 mg/dL (ref 61–437)
IgG (Immunoglobin G), Serum: 967 mg/dL (ref 603–1613)
IgM (Immunoglobulin M), Srm: 10 mg/dL — ABNORMAL LOW (ref 15–143)
Total Protein ELP: 6.4 g/dL (ref 6.0–8.5)

## 2024-06-16 ENCOUNTER — Encounter (HOSPITAL_BASED_OUTPATIENT_CLINIC_OR_DEPARTMENT_OTHER): Admitting: General Surgery

## 2024-06-23 ENCOUNTER — Other Ambulatory Visit: Payer: Self-pay | Admitting: Hematology and Oncology

## 2024-06-23 DIAGNOSIS — C9001 Multiple myeloma in remission: Secondary | ICD-10-CM

## 2024-06-24 ENCOUNTER — Encounter (HOSPITAL_BASED_OUTPATIENT_CLINIC_OR_DEPARTMENT_OTHER): Admitting: General Surgery

## 2024-06-24 ENCOUNTER — Emergency Department (HOSPITAL_COMMUNITY)

## 2024-06-24 ENCOUNTER — Observation Stay (HOSPITAL_COMMUNITY)
Admission: EM | Admit: 2024-06-24 | Discharge: 2024-06-26 | Disposition: A | Discharge status: Home / Self Care | Attending: Internal Medicine | Admitting: Internal Medicine

## 2024-06-24 DIAGNOSIS — Z7901 Long term (current) use of anticoagulants: Secondary | ICD-10-CM | POA: Diagnosis not present

## 2024-06-24 DIAGNOSIS — M4855XA Collapsed vertebra, not elsewhere classified, thoracolumbar region, initial encounter for fracture: Secondary | ICD-10-CM | POA: Diagnosis not present

## 2024-06-24 DIAGNOSIS — D72819 Decreased white blood cell count, unspecified: Secondary | ICD-10-CM

## 2024-06-24 DIAGNOSIS — R079 Chest pain, unspecified: Principal | ICD-10-CM | POA: Diagnosis present

## 2024-06-24 DIAGNOSIS — I5032 Chronic diastolic (congestive) heart failure: Secondary | ICD-10-CM | POA: Diagnosis present

## 2024-06-24 DIAGNOSIS — I2510 Atherosclerotic heart disease of native coronary artery without angina pectoris: Secondary | ICD-10-CM | POA: Diagnosis not present

## 2024-06-24 DIAGNOSIS — E1159 Type 2 diabetes mellitus with other circulatory complications: Secondary | ICD-10-CM | POA: Insufficient documentation

## 2024-06-24 DIAGNOSIS — I10 Essential (primary) hypertension: Secondary | ICD-10-CM | POA: Diagnosis present

## 2024-06-24 DIAGNOSIS — M1380 Other specified arthritis, unspecified site: Secondary | ICD-10-CM | POA: Insufficient documentation

## 2024-06-24 DIAGNOSIS — D649 Anemia, unspecified: Secondary | ICD-10-CM | POA: Diagnosis not present

## 2024-06-24 DIAGNOSIS — F1120 Opioid dependence, uncomplicated: Secondary | ICD-10-CM | POA: Insufficient documentation

## 2024-06-24 DIAGNOSIS — C9000 Multiple myeloma not having achieved remission: Secondary | ICD-10-CM | POA: Diagnosis not present

## 2024-06-24 DIAGNOSIS — Z79899 Other long term (current) drug therapy: Secondary | ICD-10-CM | POA: Insufficient documentation

## 2024-06-24 DIAGNOSIS — N400 Enlarged prostate without lower urinary tract symptoms: Secondary | ICD-10-CM | POA: Diagnosis not present

## 2024-06-24 DIAGNOSIS — S22000A Wedge compression fracture of unspecified thoracic vertebra, initial encounter for closed fracture: Secondary | ICD-10-CM | POA: Diagnosis present

## 2024-06-24 DIAGNOSIS — M545 Low back pain, unspecified: Secondary | ICD-10-CM | POA: Diagnosis not present

## 2024-06-24 DIAGNOSIS — E785 Hyperlipidemia, unspecified: Secondary | ICD-10-CM | POA: Diagnosis not present

## 2024-06-24 DIAGNOSIS — Z87891 Personal history of nicotine dependence: Secondary | ICD-10-CM | POA: Insufficient documentation

## 2024-06-24 DIAGNOSIS — S32000A Wedge compression fracture of unspecified lumbar vertebra, initial encounter for closed fracture: Secondary | ICD-10-CM | POA: Diagnosis present

## 2024-06-24 DIAGNOSIS — G8929 Other chronic pain: Secondary | ICD-10-CM | POA: Diagnosis present

## 2024-06-24 DIAGNOSIS — J449 Chronic obstructive pulmonary disease, unspecified: Secondary | ICD-10-CM | POA: Diagnosis not present

## 2024-06-24 DIAGNOSIS — Z8659 Personal history of other mental and behavioral disorders: Secondary | ICD-10-CM | POA: Diagnosis not present

## 2024-06-24 DIAGNOSIS — Z856 Personal history of leukemia: Secondary | ICD-10-CM | POA: Diagnosis not present

## 2024-06-24 DIAGNOSIS — E118 Type 2 diabetes mellitus with unspecified complications: Secondary | ICD-10-CM

## 2024-06-24 DIAGNOSIS — R7989 Other specified abnormal findings of blood chemistry: Secondary | ICD-10-CM

## 2024-06-24 MED ORDER — ASPIRIN 81 MG PO CHEW
324.0000 mg | CHEWABLE_TABLET | Freq: Once | ORAL | Status: AC
  Administered 2024-06-24: 324 mg via ORAL
  Filled 2024-06-24: qty 4

## 2024-06-24 MED ORDER — NITROGLYCERIN 0.4 MG SL SUBL
0.4000 mg | SUBLINGUAL_TABLET | SUBLINGUAL | Status: DC | PRN
  Administered 2024-06-25: 0.4 mg via SUBLINGUAL
  Filled 2024-06-24 (×2): qty 1

## 2024-06-24 NOTE — ED Provider Notes (Signed)
 [place] EMERGENCY DEPARTMENT AT [facility] Provider Note   CSN: [id number] Arrival date & time: 06/24/24  2249     Patient presents with: Chest Pain   James [name]. is a 78 y.o. male.  {Add pertinent medical, surgical, social history, OB history to [id number]} The history is provided by the patient.  Chest Pain  He has history of hypertension, diabetes, hyperlipidemia, GERD, heart failure, multiple myeloma, lupus and comes in complaining of chest pains which started this afternoon.  Pain can last up to several minutes.  It is in the left side of his chest with some radiation to the back and left arm.  There is associated dyspnea without nausea or diaphoresis.  Pain seems to be worse with exertion and at times was also been worse with a deep breath.  He cannot describe the pain to me.  Of note, he was seen in the wound clinic earlier today and had an Unna boot applied to his right leg.  He is a non-smoker.    Prior to Admission medications   Medication Sig Start Date End Date Taking? Authorizing Provider  acetaminophen  (TYLENOL ) 325 MG tablet Take 3 tablets (975 mg total) by mouth every 6 (six) hours as needed. 11/28/22   Tammy Sor, PA-C  amLODipine  (NORVASC ) 10 MG tablet Take 1 tablet (10 mg total) by mouth every morning. 09/01/22   Jillian Buttery, MD  apixaban  (ELIQUIS ) 5 MG TABS tablet Take 5 mg by mouth 2 (two) times daily.    [provider]  Calcium Carb-Cholecalciferol 600-10 MG-MCG TABS Take 1 tablet by mouth 2 (two) times daily. 11/28/22   [provider]  Calcium Carbonate-Vit D-Min (CALCIUM-VITAMIN D-MINERALS) 600-400 MG-UNIT CHEW Chew 1 tablet by mouth in the morning and at bedtime.    [provider]  colestipol  (COLESTID ) 1 g tablet Take 2 g by mouth 2 (two) times daily. Noon and bedtime 02/11/20   [provider]  doxycycline  (VIBRA -TABS) 100 MG tablet Take 1 tablet (100 mg total) by mouth 2 (two) times daily.  05/01/24   Kara Dorn NOVAK, MD  Ferrous Fumarate  (HEMOCYTE - 106 MG FE) 324 (106 Fe) MG TABS tablet Take 1 tablet by mouth every morning. 02/15/21   [provider]  finasteride  (PROSCAR ) 5 MG tablet Take 5 mg by mouth every morning.    [provider]  furosemide  (LASIX ) 20 MG tablet TAKE 1 TABLET BY MOUTH EVERY DAY Patient taking differently: Take 20 mg by mouth in the morning. 08/08/22   Gladis Leonor HERO, MD  gabapentin  (NEURONTIN ) 400 MG capsule Take 400-800 mg by mouth See admin instructions. Take 400mg  by mouth at lunch time and 800 mg by mouth at bedtime. 08/26/15   [provider]  HYDROcodone -acetaminophen  (NORCO) 10-325 MG tablet Take 1 tablet by mouth every 6 (six) hours as needed for moderate pain or severe pain. 09/16/16   [provider]  insulin  degludec (TRESIBA  FLEXTOUCH) 100 UNIT/ML FlexTouch Pen Inject 15 Units into the skin at bedtime.    [provider]  lenalidomide  (REVLIMID ) 15 MG capsule Take 1 capsule (15 mg total) by mouth daily. Take 1 capsule daily for 21 days. None for the next 7 days 05/28/24   Federico Norleen ONEIDA MADISON, MD  lidocaine  (LIDODERM ) 5 % Place 1 patch onto the skin daily. Remove & Discard patch within 12 hours or as directed by MD 01/02/22   Ruthe Cornet, DO  loperamide  (IMODIUM  A-D) 2 MG tablet Take 1  tablet (2 mg total) by mouth 3 (three) times daily as needed for diarrhea or loose stools. 09/01/22   Jillian Buttery, MD  loperamide  (IMODIUM ) 2 MG capsule Take 1 capsule (2 mg total) by mouth 4 (four) times daily as needed for diarrhea or loose stools. 12/18/22   Laurice Maude BROCKS, MD  losartan  (COZAAR ) 50 MG tablet Take 1 tablet (50 mg total) by mouth every morning. 09/01/22   Jillian Buttery, MD  mometasone  (ELOCON ) 0.1 % cream Apply topically daily as needed to treat the eczematous otitis externa. 07/20/23   Karis Clunes, MD  morphine  (MS CONTIN ) 30 MG 12 hr tablet Take 30 mg by mouth every 12 (twelve) hours. 07/20/22   [provider]  naloxone [facility]) nasal spray 4 mg/0.1 mL Place into the nose. 10/25/22   [provider]  pantoprazole  (PROTONIX ) 40 MG tablet Take 40 mg by mouth 2 (two) times daily. 12/14/20   [provider]  pioglitazone  (ACTOS ) 15 MG tablet Take 15 mg by mouth in the morning. 01/13/21   [provider]  Potassium Chloride  ER 20 MEQ TBCR Take 1 tablet (20 mEq total) by mouth daily for 7 days. 12/03/22 05/01/24  Neomi Johnston DASEN, PA-C  predniSONE  (DELTASONE ) 10 MG tablet Take 4 tablets (40 mg total) by mouth daily with breakfast. 05/01/24   Kara Dorn NOVAK, MD  RESTASIS  0.05 % ophthalmic emulsion Place 1 drop into both eyes 2 (two) times daily as needed for dry eyes. 11/27/19   [provider]  Tamsulosin  HCl (FLOMAX ) 0.4 MG CAPS Take 0.4 mg by mouth daily.    [provider]  tiZANidine  (ZANAFLEX ) 4 MG tablet Take 1 tablet (4 mg total) by mouth every 8 (eight) hours as needed for muscle spasms. 09/01/22   Jillian Buttery, MD  TRELEGY ELLIPTA  200-62.5-25 MCG/ACT AEPB Take 1 puff by mouth 2 (two) times daily. Patient taking differently: Take 1 puff by mouth daily. 09/01/22   Jillian Buttery, MD  vitamin B-12 (CYANOCOBALAMIN ) 500 MCG tablet Take 500 mcg by mouth every morning.    [provider]  zolpidem  (AMBIEN ) 5 MG tablet Take 1 tablet (5 mg total) by mouth at bedtime as needed for sleep. 09/01/22   Jillian Buttery, MD  terazosin  (HYTRIN ) 5 MG capsule Take 5 mg by mouth 2 (two) times daily.   10/25/11  [provider]    Allergies: Invokana [canagliflozin] and Ace inhibitors    Review of Systems  Cardiovascular:  Positive for chest pain.  All other systems reviewed and are negative.   Updated Vital Signs BP (!) 158/62 (BP Location: Right Arm)   Pulse 70   Temp 98.4 F (36.9 C) (Oral)   Resp 18   Ht 5' 6 (1.676 m)   Wt 85.7 kg   SpO2 99%   BMI 30.51 kg/m   Physical Exam Vitals and nursing note reviewed.   78 year old male,  resting comfortably and in no acute distress. Vital signs are significant for elevated blood pressure. Oxygen saturation is 99%, which is normal. Head is normocephalic and atraumatic. PERRLA, EOMI. Oropharynx is clear. Neck is nontender and supple without adenopathy or JVD. Back is nontender and there is no CVA tenderness. Lungs are clear without rales, wheezes, or rhonchi. Chest is nontender. Heart has regular rate and rhythm with 2/6 systolic ejection murmur best heard at the cardiac base Abdomen is soft, flat, nontender. Extremities have 2+ pitting edema.  Unna boot is present on the right and not  removed. Skin is warm and dry without rash. Neurologic: Mental status is normal, cranial nerves are intact, moves all extremities equally.  (all labs ordered are listed, but only abnormal results are displayed) Labs Reviewed  BASIC METABOLIC PANEL WITH GFR  CBC  TROPONIN T, HIGH SENSITIVITY    EKG: EKG Interpretation Date/Time:  Tuesday June 24 2024 23:05:53 EST Ventricular Rate:  67 PR Interval:  163 QRS Duration:  145 QT Interval:  454 QTC Calculation: 480 R Axis:   -50  Text Interpretation: Sinus rhythm RBBB and LAFB Left ventricular hypertrophy When compared with ECG of 05/09/2024, No significant change was found Confirmed by Raford Lenis ([id number]) on 06/24/2024 11:09:06 PM  Radiology: No results found.  Cardiac monitor shows normal sinus rhythm, per my interpretation.  Procedures   Medications Ordered in the ED  aspirin  chewable tablet 324 mg (has no administration in time range)  nitroGLYCERIN  (NITROSTAT ) SL tablet 0.4 mg (has no administration in time range)      {Click here for ABCD2, HEART and other calculators REFRESH Note before signing:1}                              Medical Decision Making Amount and/or Complexity of Data Reviewed Labs: ordered. Radiology: ordered.   Chest pain of uncertain cause.  This is a presentation with a wide range of treatment  options and carries with it high risk morbidity and complications.  Differential diagnosis includes, but is not limited to, stable angina, unstable angina, ACS, pulmonary embolism, GERD, pericarditis, pneumonia.  I reviewed his electrocardiogram, my interpretation is right bundle branch block and left anterior fascicular block and left ventricular hypertrophy unchanged from prior.  No acute ST or T changes.  I have reviewed his past records and note wound care visit earlier today for diabetic ulcers of the right leg with Unna boot applied.  Because of multiple myeloma and heart failure, he is at risk for pulmonary embolism and requires D-dimer to rule that out.  Echocardiogram on 08/23/2022 showed left ventricular ejection fraction 60-65% with indeterminate left ventricular diastolic parameters.  Echocardiogram 08/17/2021 showed grade 1 diastolic dysfunction.  Nuclear stress test on 12/11/2018 was a normal study.  He is currently having pain, I have ordered aspirin  and nitroglycerin .  {Document critical care time when appropriate  Document review of labs and clinical decision tools ie CHADS2VASC2, etc  Document your independent review of radiology images and any outside records  Document your discussion with family members, caretakers and with consultants  Document social determinants of health affecting pt's care  Document your decision making why or why not admission, treatments were needed:[id number]:::1}   Final diagnoses:  None    ED Discharge Orders     None

## 2024-06-24 NOTE — ED Triage Notes (Signed)
 Patient brought in by POV with c/o left sided chest pain that radiates to left arm. Denies any shortness of breath. Symptoms started this evening. Denies any cardiac history. Takes Elquis

## 2024-06-25 ENCOUNTER — Other Ambulatory Visit: Payer: Self-pay

## 2024-06-25 ENCOUNTER — Other Ambulatory Visit: Payer: Self-pay | Admitting: *Deleted

## 2024-06-25 ENCOUNTER — Inpatient Hospital Stay (HOSPITAL_COMMUNITY)

## 2024-06-25 DIAGNOSIS — I2510 Atherosclerotic heart disease of native coronary artery without angina pectoris: Secondary | ICD-10-CM

## 2024-06-25 DIAGNOSIS — C9001 Multiple myeloma in remission: Secondary | ICD-10-CM

## 2024-06-25 DIAGNOSIS — R079 Chest pain, unspecified: Principal | ICD-10-CM

## 2024-06-25 DIAGNOSIS — I4891 Unspecified atrial fibrillation: Secondary | ICD-10-CM | POA: Diagnosis not present

## 2024-06-25 DIAGNOSIS — R0789 Other chest pain: Secondary | ICD-10-CM

## 2024-06-25 DIAGNOSIS — M47816 Spondylosis without myelopathy or radiculopathy, lumbar region: Secondary | ICD-10-CM | POA: Insufficient documentation

## 2024-06-25 DIAGNOSIS — I10 Essential (primary) hypertension: Secondary | ICD-10-CM

## 2024-06-25 LAB — CBC WITH DIFFERENTIAL/PLATELET
Abs Immature Granulocytes: 0.01 K/uL (ref 0.00–0.07)
Basophils Absolute: 0.1 K/uL (ref 0.0–0.1)
Basophils Relative: 1 %
Eosinophils Absolute: 0.2 K/uL (ref 0.0–0.5)
Eosinophils Relative: 4 %
HCT: 34.9 % — ABNORMAL LOW (ref 39.0–52.0)
Hemoglobin: 11.5 g/dL — ABNORMAL LOW (ref 13.0–17.0)
Immature Granulocytes: 0 %
Lymphocytes Relative: 16 %
Lymphs Abs: 0.6 K/uL — ABNORMAL LOW (ref 0.7–4.0)
MCH: 31.4 pg (ref 26.0–34.0)
MCHC: 33 g/dL (ref 30.0–36.0)
MCV: 95.4 fL (ref 80.0–100.0)
Monocytes Absolute: 0.3 K/uL (ref 0.1–1.0)
Monocytes Relative: 7 %
Neutro Abs: 2.8 K/uL (ref 1.7–7.7)
Neutrophils Relative %: 72 %
Platelets: 176 K/uL (ref 150–400)
RBC: 3.66 MIL/uL — ABNORMAL LOW (ref 4.22–5.81)
RDW: 17 % — ABNORMAL HIGH (ref 11.5–15.5)
WBC: 3.9 K/uL — ABNORMAL LOW (ref 4.0–10.5)
nRBC: 0 % (ref 0.0–0.2)

## 2024-06-25 LAB — COMPREHENSIVE METABOLIC PANEL WITH GFR
ALT: 11 U/L (ref 0–44)
AST: 19 U/L (ref 15–41)
Albumin: 3.5 g/dL (ref 3.5–5.0)
Alkaline Phosphatase: 107 U/L (ref 38–126)
Anion gap: 8 (ref 5–15)
BUN: 10 mg/dL (ref 8–23)
CO2: 26 mmol/L (ref 22–32)
Calcium: 8.6 mg/dL — ABNORMAL LOW (ref 8.9–10.3)
Chloride: 105 mmol/L (ref 98–111)
Creatinine, Ser: 1.13 mg/dL (ref 0.61–1.24)
GFR, Estimated: >60 mL/min (ref >60)
Glucose, Bld: 95 mg/dL (ref 70–99)
Potassium: 3.5 mmol/L (ref 3.5–5.1)
Sodium: 139 mmol/L (ref 135–145)
Total Bilirubin: 0.5 mg/dL (ref 0.0–1.2)
Total Protein: 6 g/dL — ABNORMAL LOW (ref 6.5–8.1)

## 2024-06-25 LAB — BASIC METABOLIC PANEL WITH GFR
Anion gap: 9 (ref 5–15)
BUN: 13 mg/dL (ref 8–23)
CO2: 26 mmol/L (ref 22–32)
Calcium: 8.4 mg/dL — ABNORMAL LOW (ref 8.9–10.3)
Chloride: 104 mmol/L (ref 98–111)
Creatinine, Ser: 1.2 mg/dL (ref 0.61–1.24)
GFR, Estimated: >60 mL/min (ref >60)
Glucose, Bld: 103 mg/dL — ABNORMAL HIGH (ref 70–99)
Potassium: 3.6 mmol/L (ref 3.5–5.1)
Sodium: 140 mmol/L (ref 135–145)

## 2024-06-25 LAB — PHOSPHORUS: Phosphorus: 3.1 mg/dL (ref 2.5–4.6)

## 2024-06-25 LAB — ECHOCARDIOGRAM COMPLETE
Area-P 1/2: 4.57 cm2
Height: 66 in
S' Lateral: 3.1 cm
Weight: 3024 [oz_av]

## 2024-06-25 LAB — D-DIMER, QUANTITATIVE: D-Dimer, Quant: <0.27 ug{FEU}/mL (ref 0.00–0.50)

## 2024-06-25 LAB — CBC
HCT: 33.9 % — ABNORMAL LOW (ref 39.0–52.0)
Hemoglobin: 11.3 g/dL — ABNORMAL LOW (ref 13.0–17.0)
MCH: 31.5 pg (ref 26.0–34.0)
MCHC: 33.3 g/dL (ref 30.0–36.0)
MCV: 94.4 fL (ref 80.0–100.0)
Platelets: 164 K/uL (ref 150–400)
RBC: 3.59 MIL/uL — ABNORMAL LOW (ref 4.22–5.81)
RDW: 16.9 % — ABNORMAL HIGH (ref 11.5–15.5)
WBC: 3 K/uL — ABNORMAL LOW (ref 4.0–10.5)
nRBC: 0 % (ref 0.0–0.2)

## 2024-06-25 LAB — CBG MONITORING, ED
Glucose-Capillary: 70 mg/dL (ref 70–99)
Glucose-Capillary: 92 mg/dL (ref 70–99)
Glucose-Capillary: 95 mg/dL (ref 70–99)
Glucose-Capillary: 118 mg/dL — ABNORMAL HIGH (ref 70–99)

## 2024-06-25 LAB — TROPONIN T, HIGH SENSITIVITY
Troponin T High Sensitivity: 26 ng/L — ABNORMAL HIGH (ref 0–19)
Troponin T High Sensitivity: 26 ng/L — ABNORMAL HIGH (ref 0–19)

## 2024-06-25 LAB — MAGNESIUM: Magnesium: 1.9 mg/dL (ref 1.7–2.4)

## 2024-06-25 LAB — GLUCOSE, CAPILLARY: Glucose-Capillary: 106 mg/dL — ABNORMAL HIGH (ref 70–99)

## 2024-06-25 MED ORDER — FERROUS FUMARATE 324 (106 FE) MG PO TABS
1.0000 | ORAL_TABLET | Freq: Every morning | ORAL | Status: DC
  Administered 2024-06-25 – 2024-06-26 (×2): 106 mg via ORAL
  Filled 2024-06-25 (×2): qty 1

## 2024-06-25 MED ORDER — INSULIN GLARGINE-YFGN 100 UNIT/ML ~~LOC~~ SOLN
15.0000 [IU] | Freq: Every day | SUBCUTANEOUS | Status: DC
  Administered 2024-06-25: 15 [IU] via SUBCUTANEOUS
  Filled 2024-06-25 (×2): qty 0.15

## 2024-06-25 MED ORDER — AMLODIPINE BESYLATE 10 MG PO TABS
10.0000 mg | ORAL_TABLET | Freq: Every morning | ORAL | Status: DC
  Administered 2024-06-25 – 2024-06-26 (×2): 10 mg via ORAL
  Filled 2024-06-25: qty 1
  Filled 2024-06-25: qty 2

## 2024-06-25 MED ORDER — TRAMADOL HCL 50 MG PO TABS
50.0000 mg | ORAL_TABLET | Freq: Four times a day (QID) | ORAL | Status: DC | PRN
  Administered 2024-06-25: 50 mg via ORAL
  Filled 2024-06-25: qty 1

## 2024-06-25 MED ORDER — LOSARTAN POTASSIUM 50 MG PO TABS
50.0000 mg | ORAL_TABLET | Freq: Every morning | ORAL | Status: DC
  Administered 2024-06-25 – 2024-06-26 (×2): 50 mg via ORAL
  Filled 2024-06-25 (×2): qty 1

## 2024-06-25 MED ORDER — GABAPENTIN 400 MG PO CAPS
800.0000 mg | ORAL_CAPSULE | Freq: Every day | ORAL | Status: DC
  Administered 2024-06-25: 800 mg via ORAL
  Filled 2024-06-25: qty 2

## 2024-06-25 MED ORDER — GABAPENTIN 400 MG PO CAPS
400.0000 mg | ORAL_CAPSULE | Freq: Every day | ORAL | Status: DC
  Administered 2024-06-25: 400 mg via ORAL
  Filled 2024-06-25 (×2): qty 1

## 2024-06-25 MED ORDER — BUDESON-GLYCOPYRROL-FORMOTEROL 160-9-4.8 MCG/ACT IN AERO
2.0000 | INHALATION_SPRAY | Freq: Two times a day (BID) | RESPIRATORY_TRACT | Status: DC
  Administered 2024-06-25 – 2024-06-26 (×2): 2 via RESPIRATORY_TRACT
  Filled 2024-06-25: qty 5.9

## 2024-06-25 MED ORDER — ONDANSETRON HCL 4 MG/2ML IJ SOLN: 4.0000 mg | Freq: Four times a day (QID) | INTRAMUSCULAR | Status: DC | PRN

## 2024-06-25 MED ORDER — LENALIDOMIDE 15 MG PO CAPS: 15.0000 mg | ORAL_CAPSULE | Freq: Every day | ORAL | 0 refills | Status: DC

## 2024-06-25 MED ORDER — FUROSEMIDE 20 MG PO TABS
20.0000 mg | ORAL_TABLET | Freq: Every morning | ORAL | Status: DC
  Administered 2024-06-25 – 2024-06-26 (×2): 20 mg via ORAL
  Filled 2024-06-25 (×2): qty 1

## 2024-06-25 MED ORDER — INSULIN ASPART 100 UNIT/ML IJ SOLN: 0.0000 [IU] | Freq: Three times a day (TID) | INTRAMUSCULAR | Status: DC

## 2024-06-25 MED ORDER — PIOGLITAZONE HCL 15 MG PO TABS
15.0000 mg | ORAL_TABLET | Freq: Every morning | ORAL | Status: DC
  Administered 2024-06-25 – 2024-06-26 (×2): 15 mg via ORAL
  Filled 2024-06-25 (×2): qty 1

## 2024-06-25 MED ORDER — HYDROCODONE-ACETAMINOPHEN 10-325 MG PO TABS
1.0000 | ORAL_TABLET | Freq: Four times a day (QID) | ORAL | Status: DC | PRN
  Administered 2024-06-25: 1 via ORAL
  Filled 2024-06-25: qty 1

## 2024-06-25 MED ORDER — ONDANSETRON HCL 4 MG PO TABS
4.0000 mg | ORAL_TABLET | Freq: Four times a day (QID) | ORAL | Status: DC | PRN
Start: 2024-06-25 — End: 2024-06-26

## 2024-06-25 MED ORDER — POTASSIUM CHLORIDE ER 10 MEQ PO TBCR
20.0000 meq | EXTENDED_RELEASE_TABLET | Freq: Every day | ORAL | Status: DC
  Administered 2024-06-25 – 2024-06-26 (×2): 20 meq via ORAL
  Filled 2024-06-25 (×4): qty 2

## 2024-06-25 MED ORDER — MORPHINE SULFATE ER 30 MG PO TBCR
30.0000 mg | EXTENDED_RELEASE_TABLET | Freq: Three times a day (TID) | ORAL | Status: DC
  Administered 2024-06-25 – 2024-06-26 (×4): 30 mg via ORAL
  Filled 2024-06-25 (×6): qty 1

## 2024-06-25 MED ORDER — FINASTERIDE 5 MG PO TABS
5.0000 mg | ORAL_TABLET | Freq: Every morning | ORAL | Status: DC
  Administered 2024-06-25 – 2024-06-26 (×2): 5 mg via ORAL
  Filled 2024-06-25 (×2): qty 1

## 2024-06-25 MED ORDER — LENALIDOMIDE 15 MG PO CAPS: 15.0000 mg | ORAL_CAPSULE | Freq: Every day | ORAL | Status: DC

## 2024-06-25 MED ORDER — NITROGLYCERIN 2 % TD OINT
1.0000 [in_us] | TOPICAL_OINTMENT | Freq: Once | TRANSDERMAL | Status: AC
  Administered 2024-06-25: 1 [in_us] via TOPICAL
  Filled 2024-06-25: qty 1

## 2024-06-25 MED ORDER — APIXABAN 5 MG PO TABS
5.0000 mg | ORAL_TABLET | Freq: Two times a day (BID) | ORAL | Status: DC
  Administered 2024-06-25 – 2024-06-26 (×2): 5 mg via ORAL
  Filled 2024-06-25 (×2): qty 1

## 2024-06-25 MED ORDER — HYDROCODONE-ACETAMINOPHEN 10-325 MG PO TABS: 1.0000 | ORAL_TABLET | Freq: Four times a day (QID) | ORAL | Status: DC | PRN

## 2024-06-25 MED ORDER — CYANOCOBALAMIN 500 MCG PO TABS
500.0000 ug | ORAL_TABLET | Freq: Every morning | ORAL | Status: DC
  Administered 2024-06-25 – 2024-06-26 (×2): 500 ug via ORAL
  Filled 2024-06-25 (×2): qty 1

## 2024-06-25 MED ORDER — TAMSULOSIN HCL 0.4 MG PO CAPS
0.4000 mg | ORAL_CAPSULE | Freq: Every day | ORAL | Status: DC
  Administered 2024-06-25 – 2024-06-26 (×2): 0.4 mg via ORAL
  Filled 2024-06-25 (×2): qty 1

## 2024-06-25 MED ORDER — PERFLUTREN LIPID MICROSPHERE
1.0000 mL | INTRAVENOUS | Status: AC | PRN
  Administered 2024-06-25: 3 mL via INTRAVENOUS

## 2024-06-25 MED ORDER — LINACLOTIDE 145 MCG PO CAPS: 145.0000 ug | ORAL_CAPSULE | Freq: Every day | ORAL | Status: DC | PRN

## 2024-06-25 MED ORDER — PANTOPRAZOLE SODIUM 40 MG PO TBEC
40.0000 mg | DELAYED_RELEASE_TABLET | Freq: Two times a day (BID) | ORAL | Status: DC
  Administered 2024-06-25 – 2024-06-26 (×3): 40 mg via ORAL
  Filled 2024-06-25 (×3): qty 1

## 2024-06-25 NOTE — Care Management CC44 (Signed)
 Condition Code 44 Documentation Completed  Patient Details  Name: James [name]. MRN: [id number] Date of Birth: Apr 12, 1946   Condition Code 44 given:    Patient signature on Condition Code 44 notice:    Documentation of 2 MD's agreement:    Code 44 added to claim:     Vm left for patient with observation status information.   Merilee LOISE Batty, RN 06/25/2024, 3:35 PM

## 2024-06-25 NOTE — H&P (Signed)
 History and Physical    Patient: James [name]. FMW:[id number] DOB: 1946-01-13 DOA: 06/24/2024 DOS: the patient was seen and examined on 06/25/2024 PCP: Bari Morgans, NP  Patient coming from: Home  Chief Complaint:  Chief Complaint  Patient presents with   Chest Pain   HPI: James [name]. is a 78 y.o. male with medical history significant of anemia, osteoarthritis, BPH, dyspnea, chronic diastolic heart failure, hypertension, hyperlipidemia, chronic pain syndrome, type 2 diabetes, migraine headaches, history of treated hep B and hep C, SLE, multiple myeloma, sleep apnea on CPAP, hypothyroidism, paroxysmal atrial fibrillation who presented to the emergency department complaints of chest pain radiated to his left arm without dyspnea, palpitations, nausea, emesis, diaphoresis, PND or recent lower extremity edema. He denied fever, chills, rhinorrhea, sore throat, wheezing or hemoptysis.  No abdominal pain, diarrhea, constipation, melena or hematochezia.  No flank pain, dysuria, frequency or hematuria.  No polyuria, polydipsia, polyphagia or blurred vision.   Lab work: CBC showed a white count of 3.9 with 72% neutrophils, hemoglobin 11.5 g/dL and platelets 823.  D-dimer was normal.  BMP showed glucose of 103 and calcium 8.4 mg/dL, the rest of the electrolytes and renal functions was normal.  Troponin was 26 ng/L's x 2.  Imaging: 2 view chest radiograph with mild linear opacity at the lung bases, which may represent atelectasis versus mild infiltration.  Cardiomegaly without pulmonary edema or vascular congestion.   ED course: Initial vital signs were temperature 98.4 F, pulse 69, respiration 18, BP 150/62 mmHg and O2 sat 98% on room air.  The patient received aspirin  324 mg p.o. x 1 and nitroglycerin  patch.  Review of Systems: As mentioned in the history of present illness. All other systems reviewed and are negative. Past Medical History:  Diagnosis Date   Anemia    Arthritis     BPH (benign prostatic hyperplasia)    CHF (congestive heart failure) (HCC)    Chronic pain    Diabetes mellitus    Dyspnea    GERD (gastroesophageal reflux disease)    Headache(784.0)    migraines, sinus headaches   Hepatitis    C and B-treated   HLD (hyperlipidemia)    Hypertension    Leukemia (HCC)    Lupus    Multiple myeloma (HCC)    Pneumonia 07/2011   Sleep apnea 03/2018   going for fitting on 04-29-18   Thyroid  disease    goiter   Urinary frequency    Past Surgical History:  Procedure Laterality Date   ABDOMINAL SURGERY     BACK SURGERY     x 5    BIOPSY  01/02/2020   Procedure: BIOPSY;  Surgeon: Rollin Dover, MD;  Location: [facility];  Service: Endoscopy;;   CHOLECYSTECTOMY     COLONOSCOPY     COLONOSCOPY WITH PROPOFOL  N/A 01/02/2020   Procedure: COLONOSCOPY WITH PROPOFOL ;  Surgeon: Rollin Dover, MD;  Location: [facility];  Service: Endoscopy;  Laterality: N/A;   cyst removal skull  20 years ago   ETHMOIDECTOMY  10/12/2011   Procedure: ETHMOIDECTOMY;  Surgeon: Lynwood JINNY Genet, MD;  Location: [facility] OR;  Service: ENT;  Laterality: Bilateral;  bilateral maxillary sinus osteal enlargement, frontal sinusotomy   EYE SURGERY     bilat cataract with lens implants   HAND SURGERY     right finger   HERNIA REPAIR     LAPAROSCOPIC APPENDECTOMY N/A 11/26/2022   Procedure: APPENDECTOMY LAPAROSCOPIC;  Surgeon: Ebbie Cough, MD;  Location: [facility];  Service: General;  Laterality: N/A;   MICROLARYNGOSCOPY N/A 02/18/2021   Procedure: MICROLARYNGOSCOPY with Excisional Biopsy;  Surgeon: Mable Lenis, MD;  Location: [facility] OR;  Service: ENT;  Laterality: N/A;   POLYPECTOMY  01/02/2020   Procedure: POLYPECTOMY;  Surgeon: Rollin Dover, MD;  Location: [facility];  Service: Endoscopy;;   ROTATOR CUFF REPAIR     bilateral   SHOULDER ARTHROSCOPY WITH ROTATOR CUFF REPAIR Left 05/18/2014   Procedure: SHOULDER ARTHROSCOPY WITH ROTATOR CUFF REPAIR;  Surgeon: Eva Elsie Herring,  MD;  Location: [facility];  Service: Orthopedics;  Laterality: Left;  Left shoulder arthroscopy rotator cuff repair, subacromial decompression   sinus surgery     TONSILLECTOMY     TRIGGER FINGER RELEASE Right 05/02/2018   Procedure: RELEASE TRIGGER FINGER/A-1 PULLEY RIGHT SMALL FINGER;  Surgeon: Murrell Drivers, MD;  Location: [facility];  Service: Orthopedics;  Laterality: Right;   Social History:  reports that he quit smoking about 40 years ago. His smoking use included cigarettes. He started smoking about 66 years ago. He has a 26 pack-year smoking history. He has never used smokeless tobacco. He reports that he does not currently use drugs after having used the following drugs: Heroin. He reports that he does not drink alcohol.  Allergies  Allergen Reactions   Invokana [Canagliflozin] Anaphylaxis    Facial/neck/ lip swelling   Ace Inhibitors Cough    Family History  Problem Relation Age of Onset   Hyperlipidemia Mother    Hypertension Mother     Prior to Admission medications   Medication Sig Start Date End Date Taking? Authorizing Provider  amLODipine  (NORVASC ) 10 MG tablet Take 1 tablet (10 mg total) by mouth every morning. 09/01/22  Yes Jillian Buttery, MD  apixaban  (ELIQUIS ) 5 MG TABS tablet Take 5 mg by mouth 2 (two) times daily.   Yes [provider]  Calcium Carbonate-Vit D-Min (CALCIUM-VITAMIN D-MINERALS) 600-400 MG-UNIT CHEW Chew 1 tablet by mouth in the morning and at bedtime.   Yes [provider]  Ferrous Fumarate  (HEMOCYTE - 106 MG FE) 324 (106 Fe) MG TABS tablet Take 1 tablet by mouth every morning. 02/15/21  Yes [provider]  finasteride  (PROSCAR ) 5 MG tablet Take 5 mg by mouth every morning.   Yes [provider]  furosemide  (LASIX ) 20 MG tablet TAKE 1 TABLET BY MOUTH EVERY DAY Patient taking differently: Take 20 mg by mouth in the morning. 08/08/22  Yes Gladis Leonor HERO, MD  gabapentin  (NEURONTIN ) 400 MG  capsule Take 400-800 mg by mouth See admin instructions. Take 400mg  by mouth at lunch time and 800 mg by mouth at bedtime. 08/26/15  Yes [provider]  HYDROcodone -acetaminophen  (NORCO) 10-325 MG tablet Take 1 tablet by mouth every 6 (six) hours as needed for moderate pain (pain score 4-6) or severe pain (pain score 7-10). 09/16/16  Yes [provider]  insulin  degludec (TRESIBA  FLEXTOUCH) 100 UNIT/ML FlexTouch Pen Inject 15 Units into the skin daily.   Yes [provider]  lenalidomide  (REVLIMID ) 15 MG capsule Take 1 capsule (15 mg total) by mouth daily. Take 1 capsule daily for 21 days. None for the next 7 days 05/28/24  Yes Federico Norleen ONEIDA MADISON, MD  LINZESS  145 MCG CAPS capsule Take 145 mcg by mouth daily as needed.   Yes [provider]  losartan  (COZAAR ) 50 MG tablet Take 1 tablet (50 mg total) by mouth every morning. 09/01/22  Yes Jillian Buttery, MD  morphine  (MS CONTIN ) 15  MG 12 hr tablet Take 30 mg by mouth in the morning, at noon, and at bedtime.   Yes [provider]  pantoprazole  (PROTONIX ) 40 MG tablet Take 40 mg by mouth 2 (two) times daily. 12/14/20  Yes [provider]  pioglitazone  (ACTOS ) 15 MG tablet Take 15 mg by mouth in the morning. 01/13/21  Yes [provider]  potassium chloride  (KLOR-CON ) 10 MEQ tablet Take 20 mEq by mouth daily.   Yes [provider]  Tamsulosin  HCl (FLOMAX ) 0.4 MG CAPS Take 0.4 mg by mouth daily.   Yes [provider]  traMADol  (ULTRAM ) 50 MG tablet Take 50 mg by mouth every 6 (six) hours as needed.   Yes [provider]  TRELEGY ELLIPTA  200-62.5-25 MCG/ACT AEPB Take 1 puff by mouth 2 (two) times daily. Patient taking differently: Take 1 puff by mouth daily. 09/01/22  Yes Jillian Buttery, MD  vitamin B-12 (CYANOCOBALAMIN ) 500 MCG tablet Take 500 mcg by mouth every morning.   Yes [provider]  acetaminophen  (TYLENOL ) 325 MG tablet Take 3 tablets (975 mg total) by  mouth every 6 (six) hours as needed. Patient not taking: Reported on 06/25/2024 11/28/22   Tammy Sor, PA-C  doxycycline  (VIBRA -TABS) 100 MG tablet Take 1 tablet (100 mg total) by mouth 2 (two) times daily. Patient not taking: Reported on 06/25/2024 05/01/24   Kara Dorn NOVAK, MD  lidocaine  (LIDODERM ) 5 % Place 1 patch onto the skin daily. Remove & Discard patch within 12 hours or as directed by MD Patient not taking: Reported on 06/25/2024 01/02/22   Ruthe Cornet, DO  loperamide  (IMODIUM  A-D) 2 MG tablet Take 1 tablet (2 mg total) by mouth 3 (three) times daily as needed for diarrhea or loose stools. Patient not taking: Reported on 06/25/2024 09/01/22   Jillian Buttery, MD  loperamide  (IMODIUM ) 2 MG capsule Take 1 capsule (2 mg total) by mouth 4 (four) times daily as needed for diarrhea or loose stools. Patient not taking: Reported on 06/25/2024 12/18/22   Laurice Maude BROCKS, MD  mometasone  (ELOCON ) 0.1 % cream Apply topically daily as needed to treat the eczematous otitis externa. Patient not taking: Reported on 06/25/2024 07/20/23   Karis Clunes, MD  Potassium Chloride  ER 20 MEQ TBCR Take 1 tablet (20 mEq total) by mouth daily for 7 days. Patient not taking: Reported on 06/25/2024 12/03/22 05/01/24  Thayil, Irene T, PA-C  predniSONE  (DELTASONE ) 10 MG tablet Take 4 tablets (40 mg total) by mouth daily with breakfast. Patient not taking: Reported on 06/25/2024 05/01/24   Kara Dorn NOVAK, MD  tiZANidine  (ZANAFLEX ) 4 MG tablet Take 1 tablet (4 mg total) by mouth every 8 (eight) hours as needed for muscle spasms. Patient not taking: Reported on 06/25/2024 09/01/22   Jillian Buttery, MD  zolpidem  (AMBIEN ) 5 MG tablet Take 1 tablet (5 mg total) by mouth at bedtime as needed for sleep. Patient not taking: Reported on 06/25/2024 09/01/22   Jillian Buttery, MD  terazosin  (HYTRIN ) 5 MG capsule Take 5 mg by mouth 2 (two) times daily.   10/25/11  [provider]    Physical Exam: Vitals:   06/25/24  0219 06/25/24 0645 06/25/24 0700 06/25/24 0713  BP: (!) 131/58  (!) 139/95   Pulse: (!) 57  (!) 53   Resp: 14  15   Temp: 98.4 F (36.9 C) 97.7 F (36.5 C)    TempSrc: Oral Oral    SpO2: 96%  96% 98%  Weight:  Height:       Physical Exam Vitals and nursing note reviewed.  Constitutional:      General: He is awake. He is not in acute distress.    Appearance: He is well-developed. He is obese. He is ill-appearing.  HENT:     Head: Normocephalic.     Nose: No rhinorrhea.     Mouth/Throat:     Mouth: Mucous membranes are moist.  Eyes:     General: No scleral icterus.    Pupils: Pupils are equal, round, and reactive to light.  Neck:     Vascular: No JVD.  Cardiovascular:     Rate and Rhythm: Normal rate and regular rhythm.     Heart sounds: S1 normal and S2 normal.  Pulmonary:     Effort: Pulmonary effort is normal.     Breath sounds: Normal breath sounds. No wheezing, rhonchi or rales.  Abdominal:     General: Bowel sounds are normal. There is no distension.     Palpations: Abdomen is soft.     Tenderness: There is no abdominal tenderness. There is no right CVA tenderness or left CVA tenderness.  Musculoskeletal:     Cervical back: Neck supple.     Right lower leg: No edema.     Left lower leg: No edema.  Neurological:     General: No focal deficit present.     Mental Status: He is alert and oriented to person, place, and time.  Psychiatric:        Mood and Affect: Mood normal.        Behavior: Behavior normal. Behavior is cooperative.     Data Reviewed:  Results are pending, will review when available. 08/23/2022 echocardiogram report. IMPRESSIONS:   1. Left ventricular ejection fraction, by estimation, is 60 to 65%. The  left ventricle has normal function. The left ventricle has no regional  wall motion abnormalities. Left ventricular diastolic parameters are  indeterminate.   2. Right ventricular systolic function is normal. The right ventricular  size  is normal.   3. The mitral valve is abnormal. No evidence of mitral valve  regurgitation. No evidence of mitral stenosis.   4. The aortic valve is normal in structure. There is mild calcification  of the aortic valve. There is mild thickening of the aortic valve. Aortic  valve regurgitation is not visualized. Aortic valve sclerosis is present,  with no evidence of aortic valve  stenosis.   5. The inferior vena cava is normal in size with greater than 50%  respiratory variability, suggesting right atrial pressure of 3 mmHg.   EKG: Vent. rate 67 BPM  PR interval 163 ms  QRS duration 145 ms  QT/QTcB 454/480 ms  P-R-T axes 39 -50 47  Sinus rhythm  RBBB and LAFB  Left ventricular hypertrophy   Assessment and Plan: Principal Problem:   Chest pain On day patient with history of:   CAD (coronary artery disease)  Pain seems to be atypical. Observation/telemetry. Received aspirin  earlier. Continue apixaban  twice daily. Continue amlodipine  10 mg p.o. daily. Continue as needed nitroglycerin . Check echocardiogram. Cardiology consult appreciated.  Active Problems:   Chronic diastolic CHF (congestive heart failure) (HCC) No signs of decompensation. Continue furosemide  20 mg p.o. every evening. Continue losartan  50 mg p.o. daily.    Essential hypertension Continue amlodipine , furosemide  and losartan  as above.    COPD (chronic obstructive pulmonary disease) (HCC) Continue Trelegy Ellipta  or formulary equivalent. Short acting bronchodilators as needed.    Type 2  diabetes mellitus with complication,  with long-term current use of insulin  (HCC) Carbohydrate modified diet. CBG monitoring with RI SS. Check hemoglobin A1c.    Multiple myeloma (HCC) Follow-up with at the cancer center 1 rescheduled.    Chronic back pain In the setting of:   Compression fracture of body of thoracic vertebra (HCC) And:   Compression fracture of lumbar vertebra (HCC) Continue MS Contin  30 mg p.o. 3  times daily. Continue tramadol  50 mg every 6 hours as needed.    BPH (benign prostatic hyperplasia) Continue tamsulosin  0.4 mg p.o. daily.     Advance Care Planning:   Code Status: Full Code   Consults: Cardiology ( Kardie Tobb, DO)  Family Communication:   Severity of Illness: The appropriate patient status for this patient is OBSERVATION. Observation status is judged to be reasonable and necessary in order to provide the required intensity of service to ensure the patient's safety. The patient's presenting symptoms, physical exam findings, and initial radiographic and laboratory data in the context of their medical condition is felt to place them at decreased risk for further clinical deterioration. Furthermore, it is anticipated that the patient will be medically stable for discharge from the hospital within 2 midnights of admission.   Author: Alm Dorn Castor, MD 06/25/2024 7:48 AM  For on call review [email or web address].   This document was prepared using Dragon voice recognition software and may contain some unintended transcription errors.

## 2024-06-25 NOTE — ED Notes (Signed)
 Pt provided orange juice

## 2024-06-25 NOTE — ED Notes (Signed)
 Ordered breakfast tray

## 2024-06-25 NOTE — Progress Notes (Signed)
 Carryover: patient presented with atrial chest pain relieved with Ntg . Admit for ischemic eval. Trips flat.

## 2024-06-25 NOTE — Care Management Obs Status (Signed)
 MEDICARE OBSERVATION STATUS NOTIFICATION   Patient Details  Name: James [name]. MRN: [id number] Date of Birth: [birth year]   Medicare Observation Status Notification Given:  Yes (Unable to give printed copy, CM not on site. Patient does not have MyChart.) Left information on patient's vm.     Merilee LOISE Batty, RN 06/25/2024, 3:42 PM

## 2024-06-25 NOTE — Progress Notes (Signed)
 Prior-To-Admission Oral Chemotherapy for Treatment of Oncologic Disease   Order noted from Dr. Celinda to continue prior-to-admission oral chemotherapy regimen of Revlimid .  Procedure Per Pharmacy & Therapeutics Committee Policy: Orders for continuation of home oral chemotherapy for treatment of an oncologic disease will be held unless approved by an oncologist during current admission.    For patients receiving oncology care at [facility], inpatient pharmacist contacts patient's oncologist during regular office hours to review. If earlier review is medically necessary, attending physician consults [facility] on-call oncologist   For patients receiving oncology care outside of [facility], attending physician consults patient's oncologist to review. If this oncologist or their coverage cannot be reached, attending physician consults [facility] on-call oncologist   Plan: Per Dr Federico, hold Revlimid  while inpatient and it can be restarted upon discharge.     Eleanor Agent, PharmD, BCPS 06/25/2024, 12:11 PM

## 2024-06-25 NOTE — Consult Note (Signed)
 Cardiology Consultation   Patient ID: James [name]. MRN: [id number]; DOB: February 23, 1946  Admit date: 06/24/2024 Date of Consult: 06/25/2024  PCP:  James Morgans, NP   [facility] Providers Cardiologist:  James Bruckner, MD        Patient Profile: James [name]. is a 78 y.o. male with a hx of hypertension, type 2 diabetes, CAD (seen on CT), l permanent atrial fibrillation on Eliquis , lupus, multiple myeloma, and hepatitis B&C who is being seen 06/25/2024 for the evaluation of Chest pain at the request of James Dorn Castor MD.  History of Present Illness: James [name] is an 78 year old male with prior cardiac history listed below.  The patient was seen in the hospital by Dr. Bruckner on 09/2022 dyspnea on exertion.  Plans were made to do a nuclear stress test and echocardiogram.  The nuclear stress test showed no evidence of ischemia or infarct and a normal LVEF of 63%.  Echocardiogram showed a normal LVEF of 60 to 65%, mild LVH, normal RV systolic function, mitral and aortic valve calcification, and grossly normal valve function.  Echo 07/2022 showed normal LVEF of 60 to 65%, no RWMA, normal RV systolic function, mild aortic valve calcification, and grossly normal valve function.  The patient has been followed by wound care for wounds on her lower extremities.  Patient presented to the emergency department for chest pain.  Labs showed high-sensitivity troponins 26 > 26, potassium of 3.6, sodium of 140, creatinine of 1.2, BUN of 13, WBC count of 3, anemia with a hemoglobin of 11.3, and negative D-dimer.  EKG showed normal sinus rhythm with a rate of 67, right bundle branch block, left anterior fascicular block, anterior T wave flattening.  Seen on prior EKG.  Chest x-ray showed Mild linear opacity at the lung bases, which may represent atelectasis versus mild infiltration. Cardiomegaly without pulmonary edema or vascular congestion  Of note the patient  have another set of records .James [name]. James [name] Dec 26, 1945 and, MRN: [id number]  -in that he had seen James [name] atrial fibrillation was reported to be permanent and his last visit in the ER was in 2023.  During my encounter  patient reported that he presented due to midsternal chest discomfort.  He tells me that it was abrupt lasted for a few minutes.  And then when he presented to the ED once he got some aspirin  he felt better.  He could not really explain details to me of the pain.  But he was she is happy that this has resolved.  From my perspective he is not really a good historian.  Because he also could not remember visiting James [name].   Past Medical History:  Diagnosis Date   Anemia    Arthritis    BPH (benign prostatic hyperplasia)    CHF (congestive heart failure) (HCC)    Chronic pain    Diabetes mellitus    Dyspnea    GERD (gastroesophageal reflux disease)    Headache(784.0)    migraines, sinus headaches   Hepatitis    C and B-treated   HLD (hyperlipidemia)    Hypertension    Leukemia (HCC)    Lupus    Multiple myeloma (HCC)    Pneumonia 07/2011   Sleep apnea 03/2018   going for fitting on 04-29-18   Thyroid  disease    goiter   Urinary frequency     Past Surgical History:  Procedure Laterality Date   ABDOMINAL SURGERY     BACK  SURGERY     x 5    BIOPSY  01/02/2020   Procedure: BIOPSY;  Surgeon: Rollin Dover, MD;  Location: [facility];  Service: Endoscopy;;   CHOLECYSTECTOMY     COLONOSCOPY     COLONOSCOPY WITH PROPOFOL  N/A 01/02/2020   Procedure: COLONOSCOPY WITH PROPOFOL ;  Surgeon: Rollin Dover, MD;  Location: [facility];  Service: Endoscopy;  Laterality: N/A;   cyst removal skull  20 years ago   ETHMOIDECTOMY  10/12/2011   Procedure: ETHMOIDECTOMY;  Surgeon: James JINNY Genet, MD;  Location: [facility] OR;  Service: ENT;  Laterality: Bilateral;  bilateral maxillary sinus osteal enlargement, frontal sinusotomy   EYE SURGERY     bilat cataract with lens implants    HAND SURGERY     right finger   HERNIA REPAIR     LAPAROSCOPIC APPENDECTOMY N/A 11/26/2022   Procedure: APPENDECTOMY LAPAROSCOPIC;  Surgeon: Ebbie Cough, MD;  Location: [facility];  Service: General;  Laterality: N/A;   MICROLARYNGOSCOPY N/A 02/18/2021   Procedure: MICROLARYNGOSCOPY with Excisional Biopsy;  Surgeon: Mable Lenis, MD;  Location: [facility] OR;  Service: ENT;  Laterality: N/A;   POLYPECTOMY  01/02/2020   Procedure: POLYPECTOMY;  Surgeon: Rollin Dover, MD;  Location: [facility];  Service: Endoscopy;;   ROTATOR CUFF REPAIR     bilateral   SHOULDER ARTHROSCOPY WITH ROTATOR CUFF REPAIR Left 05/18/2014   Procedure: SHOULDER ARTHROSCOPY WITH ROTATOR CUFF REPAIR;  Surgeon: Eva Elsie Herring, MD;  Location: [facility];  Service: Orthopedics;  Laterality: Left;  Left shoulder arthroscopy rotator cuff repair, subacromial decompression   sinus surgery     TONSILLECTOMY     TRIGGER FINGER RELEASE Right 05/02/2018   Procedure: RELEASE TRIGGER FINGER/A-1 PULLEY RIGHT SMALL FINGER;  Surgeon: Murrell Drivers, MD;  Location: [facility];  Service: Orthopedics;  Laterality: Right;     Home Medications:  Prior to Admission medications   Medication Sig Start Date End Date Taking? Authorizing Provider  amLODipine  (NORVASC ) 10 MG tablet Take 1 tablet (10 mg total) by mouth every morning. 09/01/22  Yes James Buttery, MD  apixaban  (ELIQUIS ) 5 MG TABS tablet Take 5 mg by mouth 2 (two) times daily.   Yes [provider]  Calcium Carbonate-Vit D-Min (CALCIUM-VITAMIN D-MINERALS) 600-400 MG-UNIT CHEW Chew 1 tablet by mouth in the morning and at bedtime.   Yes [provider]  Ferrous Fumarate  (HEMOCYTE - 106 MG FE) 324 (106 Fe) MG TABS tablet Take 1 tablet by mouth every morning. 02/15/21  Yes [provider]  finasteride  (PROSCAR ) 5 MG tablet Take 5 mg by mouth every morning.   Yes [provider]  furosemide  (LASIX ) 20 MG tablet TAKE 1  TABLET BY MOUTH EVERY DAY Patient taking differently: Take 20 mg by mouth in the morning. 08/08/22  Yes James Leonor HERO, MD  gabapentin  (NEURONTIN ) 400 MG capsule Take 400-800 mg by mouth See admin instructions. Take 400mg  by mouth at lunch time and 800 mg by mouth at bedtime. 08/26/15  Yes [provider]  HYDROcodone -acetaminophen  (NORCO) 10-325 MG tablet Take 1 tablet by mouth every 6 (six) hours as needed for moderate pain (pain score 4-6) or severe pain (pain score 7-10). 09/16/16  Yes [provider]  insulin  degludec (TRESIBA  FLEXTOUCH) 100 UNIT/ML FlexTouch Pen Inject 15 Units into the skin daily.   Yes [provider]  lenalidomide  (REVLIMID ) 15 MG capsule Take 1 capsule (15 mg total) by mouth daily. Take 1 capsule daily for 21 days. None for the  next 7 days 05/28/24  Yes Federico Norleen ONEIDA MADISON, MD  LINZESS  145 MCG CAPS capsule Take 145 mcg by mouth daily as needed.   Yes [provider]  losartan  (COZAAR ) 50 MG tablet Take 1 tablet (50 mg total) by mouth every morning. 09/01/22  Yes James Buttery, MD  morphine  (MS CONTIN ) 15 MG 12 hr tablet Take 30 mg by mouth in the morning, at noon, and at bedtime.   Yes [provider]  pantoprazole  (PROTONIX ) 40 MG tablet Take 40 mg by mouth 2 (two) times daily. 12/14/20  Yes [provider]  pioglitazone  (ACTOS ) 15 MG tablet Take 15 mg by mouth in the morning. 01/13/21  Yes [provider]  potassium chloride  (KLOR-CON ) 10 MEQ tablet Take 20 mEq by mouth daily.   Yes [provider]  Tamsulosin  HCl (FLOMAX ) 0.4 MG CAPS Take 0.4 mg by mouth daily.   Yes [provider]  traMADol  (ULTRAM ) 50 MG tablet Take 50 mg by mouth every 6 (six) hours as needed.   Yes [provider]  TRELEGY ELLIPTA  200-62.5-25 MCG/ACT AEPB Take 1 puff by mouth 2 (two) times daily. Patient taking differently: Take 1 puff by mouth daily. 09/01/22  Yes James Buttery, MD  vitamin B-12 (CYANOCOBALAMIN )  500 MCG tablet Take 500 mcg by mouth every morning.   Yes [provider]  acetaminophen  (TYLENOL ) 325 MG tablet Take 3 tablets (975 mg total) by mouth every 6 (six) hours as needed. Patient not taking: Reported on 06/25/2024 11/28/22   Tammy Sor, PA-C  doxycycline  (VIBRA -TABS) 100 MG tablet Take 1 tablet (100 mg total) by mouth 2 (two) times daily. Patient not taking: Reported on 06/25/2024 05/01/24   Kara Dorn NOVAK, MD  lidocaine  (LIDODERM ) 5 % Place 1 patch onto the skin daily. Remove & Discard patch within 12 hours or as directed by MD Patient not taking: Reported on 06/25/2024 01/02/22   Ruthe Cornet, DO  loperamide  (IMODIUM  A-D) 2 MG tablet Take 1 tablet (2 mg total) by mouth 3 (three) times daily as needed for diarrhea or loose stools. Patient not taking: Reported on 06/25/2024 09/01/22   James Buttery, MD  loperamide  (IMODIUM ) 2 MG capsule Take 1 capsule (2 mg total) by mouth 4 (four) times daily as needed for diarrhea or loose stools. Patient not taking: Reported on 06/25/2024 12/18/22   Laurice Maude BROCKS, MD  mometasone  (ELOCON ) 0.1 % cream Apply topically daily as needed to treat the eczematous otitis externa. Patient not taking: Reported on 06/25/2024 07/20/23   Karis Clunes, MD  Potassium Chloride  ER 20 MEQ TBCR Take 1 tablet (20 mEq total) by mouth daily for 7 days. Patient not taking: Reported on 06/25/2024 12/03/22 05/01/24  Thayil, Irene T, PA-C  predniSONE  (DELTASONE ) 10 MG tablet Take 4 tablets (40 mg total) by mouth daily with breakfast. Patient not taking: Reported on 06/25/2024 05/01/24   Kara Dorn NOVAK, MD  tiZANidine  (ZANAFLEX ) 4 MG tablet Take 1 tablet (4 mg total) by mouth every 8 (eight) hours as needed for muscle spasms. Patient not taking: Reported on 06/25/2024 09/01/22   James Buttery, MD  zolpidem  (AMBIEN ) 5 MG tablet Take 1 tablet (5 mg total) by mouth at bedtime as needed for sleep. Patient not taking: Reported on 06/25/2024 09/01/22   James Buttery, MD   terazosin  (HYTRIN ) 5 MG capsule Take 5 mg by mouth 2 (two) times daily.   10/25/11  [provider]    Scheduled Meds:  amLODipine   10 mg  Oral q morning   apixaban   5 mg Oral BID   budesonide -glycopyrrolate -formoterol   2 puff Inhalation BID   cyanocobalamin   500 mcg Oral q morning   Ferrous Fumarate   1 tablet Oral q morning   finasteride   5 mg Oral q morning   furosemide   20 mg Oral q AM   gabapentin   400 mg Oral Q1200   gabapentin   800 mg Oral QHS   insulin  aspart  0-9 Units Subcutaneous TID WC   insulin  glargine-yfgn  15 Units Subcutaneous Daily   lenalidomide   15 mg Oral Daily   losartan   50 mg Oral q morning   morphine   30 mg Oral TID   pantoprazole   40 mg Oral BID   pioglitazone   15 mg Oral q AM   potassium chloride   20 mEq Oral Daily   tamsulosin   0.4 mg Oral Daily   Continuous Infusions:  PRN Meds: linaclotide , nitroGLYCERIN , ondansetron  **OR** ondansetron  (ZOFRAN ) IV  Allergies:    Allergies  Allergen Reactions   Invokana [Canagliflozin] Anaphylaxis    Facial/neck/ lip swelling   Ace Inhibitors Cough    Social History:   Social History   Socioeconomic History   Marital status: Married    Spouse name: Not on file   Number of children: Not on file   Years of education: Not on file   Highest education level: Not on file  Occupational History   Not on file  Tobacco Use   Smoking status: Former    Current packs/day: 0.00    Average packs/day: 1 pack/day for 26.0 years (26.0 ttl pk-yrs)    Types: Cigarettes    Start date: 05/12/1958    Quit date: 05/12/1984    Years since quitting: 40.1   Smokeless tobacco: Never  Vaping Use   Vaping status: Not on file  Substance and Sexual Activity   Alcohol use: No   Drug use: Not Currently    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 : Never true  Transportation Needs: No Transportation Needs (05/20/2024)   Received from [facility]    In the past 12 months, has lack of reliable transportation kept you from medical appointments, meetings, work or from getting things needed for daily living? : No  Physical Activity: Not on file  Stress: Not on file  Social Connections: Not on file  Intimate Partner Violence: Not At Risk (11/24/2022)   Humiliation, Afraid, Rape, and Kick questionnaire    Fear of Current or Ex-Partner: No    Emotionally Abused: No    Physically Abused: No    Sexually Abused: No    Family History:    Family History  Problem Relation Age of Onset   Hyperlipidemia Mother    Hypertension Mother      ROS:  Please see the history of present illness.   All other ROS  reviewed and negative.     Physical Exam/Data: Vitals:   06/25/24 0900 06/25/24 1000 06/25/24 1057 06/25/24 1100  BP: (!) 155/63 (!) 165/61    Pulse: (!) 45 (!) 54  74  Resp: 13     Temp:   97.7 F (36.5 C)   TempSrc:   Oral   SpO2: 100% 100%  98%  Weight:      Height:        Intake/Output Summary (Last 24 hours) at 06/25/2024 1144 Last data filed at 06/25/2024 0918 Gross per 24 hour  Intake --  Output 800 ml  Net -800 ml      06/24/2024   10:58 PM 05/09/2024   11:30 AM 05/01/2024    3:06 PM  Last 3 Weights  Weight (lbs) 189 lb 193 lb 189 lb  Weight (kg) 85.73 kg 87.544 kg 85.73 kg     Body mass index is 30.51 kg/m.  General:  Well nourished, well developed, in no acute distress HEENT: normal Neck: no JVD Vascular: No carotid bruits; Distal pulses 2+ bilaterally Cardiac:   normal S1, S2; RRR; no murmur  Lungs:  clear to auscultation bilaterally, no wheezing, rhonchi or rales  Abd: soft, nontender, no hepatomegaly  Ext: no edema Musculoskeletal:  No deformities, BUE and BLE strength normal and equal Skin: warm and dry  Neuro:  CNs 2-12 intact, no focal abnormalities noted Psych:  Normal affect   EKG:  The EKG was personally reviewed and demonstrates:  normal sinus rhythm with a rate of 67, right bundle branch block, left anterior fascicular block, anterior T wave flattening.  Seen on prior EKG.  Telemetry:  Telemetry was personally reviewed and demonstrates:  sinus rhythm.   Relevant CV Studies: Echo pending  Laboratory Data: High Sensitivity Troponin:  No results for input(s): TROPONINIHS in the last 720 hours.   Chemistry Recent Labs  Lab 06/24/24 2358  NA 140  K 3.6  CL 104  CO2 26  GLUCOSE 103*  BUN 13  CREATININE 1.20  CALCIUM 8.4*  GFRNONAA >60  ANIONGAP 9    No results for input(s): PROT, ALBUMIN, AST, ALT, ALKPHOS, BILITOT in the last 168 hours. Lipids No results for input(s): CHOL, TRIG, HDL, LABVLDL, LDLCALC, CHOLHDL in the last 168 hours.  Hematology Recent Labs  Lab 06/24/24 2358 06/25/24 0819  WBC 3.9* 3.0*  RBC 3.66* 3.59*  HGB 11.5* 11.3*  HCT 34.9* 33.9*  MCV 95.4 94.4  MCH 31.4 31.5  MCHC 33.0 33.3  RDW 17.0* 16.9*  PLT 176 164   Thyroid  No results for input(s): TSH, FREET4 in the last 168 hours.  BNPNo results for input(s): BNP, PROBNP in the last 168 hours.  DDimer  Recent Labs  Lab 06/24/24 2358  DDIMER <0.27    Radiology/Studies:  DG Chest 2 View Result Date: 06/24/2024 EXAM: 2 VIEW(S) XRAY OF THE CHEST 06/24/2024 11:44:00 PM COMPARISON: Comparison with 11/24/2022. CLINICAL HISTORY: chest pain FINDINGS: LIMITATIONS/ARTIFACTS: Shallow inspiration. LUNGS AND PLEURA: Mild linear opacities in the lung bases suggesting infiltration or atelectasis. No vascular congestion or  edema. No pleural effusion. No pneumothorax. HEART AND MEDIASTINUM: Cardiac enlargement. Calcification of the aorta. BONES AND SOFT TISSUES: Degenerative changes in the spine and shoulders. IMPRESSION: 1. Mild linear opacity at the lung bases, which may represent atelectasis versus mild infiltration. 2. Cardiomegaly without pulmonary edema or vascular congestion. Electronically signed by: Elsie Gravely MD 06/24/2024 11:48 PM EST RP Workstation: [id number]   Assessment and Plan: Coronary artery disease with atypical  chest pain-this has resolved.  Troponin very minimal.  I do not believe this is ACS.  In the meantime though we will get an echocardiogram to assess for any structural abnormalities.  No plans for ischemic evaluation unless if chest pain recur and echo shows significant wall motion abnormality. In terms of his  atrial fibrillation continue his Eliquis , he appears to be in sinus rhythm at this time. Hypertensive heart disease-his blood pressure is elevated as he has been restarted on his losartan  50 mg daily and amlodipine  10 mg.  I do think he can benefit from increasing his losartan  to 100 mg daily.  Risk Assessment/Risk Scores:  For questions or updates, please contact [facility] Please consult [email or web address] for contact info under   Signed, Morse Clause, PA-C  06/25/2024 11:44 AM

## 2024-06-26 ENCOUNTER — Encounter (HOSPITAL_COMMUNITY): Payer: Self-pay | Admitting: Internal Medicine

## 2024-06-26 DIAGNOSIS — R079 Chest pain, unspecified: Secondary | ICD-10-CM | POA: Diagnosis not present

## 2024-06-26 DIAGNOSIS — I2510 Atherosclerotic heart disease of native coronary artery without angina pectoris: Secondary | ICD-10-CM | POA: Diagnosis not present

## 2024-06-26 DIAGNOSIS — I4891 Unspecified atrial fibrillation: Secondary | ICD-10-CM | POA: Diagnosis not present

## 2024-06-26 DIAGNOSIS — R0789 Other chest pain: Secondary | ICD-10-CM | POA: Diagnosis not present

## 2024-06-26 LAB — COMPREHENSIVE METABOLIC PANEL WITH GFR
ALT: 10 U/L (ref 0–44)
AST: 16 U/L (ref 15–41)
Albumin: 3.4 g/dL — ABNORMAL LOW (ref 3.5–5.0)
Alkaline Phosphatase: 105 U/L (ref 38–126)
Anion gap: 9 (ref 5–15)
BUN: 11 mg/dL (ref 8–23)
CO2: 28 mmol/L (ref 22–32)
Calcium: 8.5 mg/dL — ABNORMAL LOW (ref 8.9–10.3)
Chloride: 105 mmol/L (ref 98–111)
Creatinine, Ser: 1.41 mg/dL — ABNORMAL HIGH (ref 0.61–1.24)
GFR, Estimated: 50 mL/min — ABNORMAL LOW (ref >60)
Glucose, Bld: 81 mg/dL (ref 70–99)
Potassium: 3.6 mmol/L (ref 3.5–5.1)
Sodium: 141 mmol/L (ref 135–145)
Total Bilirubin: 0.5 mg/dL (ref 0.0–1.2)
Total Protein: 5.9 g/dL — ABNORMAL LOW (ref 6.5–8.1)

## 2024-06-26 LAB — HEMOGLOBIN A1C
Hgb A1c MFr Bld: 5.6 % (ref 4.8–5.6)
Mean Plasma Glucose: 114 mg/dL

## 2024-06-26 LAB — CBC
HCT: 34.9 % — ABNORMAL LOW (ref 39.0–52.0)
Hemoglobin: 11.6 g/dL — ABNORMAL LOW (ref 13.0–17.0)
MCH: 31.9 pg (ref 26.0–34.0)
MCHC: 33.2 g/dL (ref 30.0–36.0)
MCV: 95.9 fL (ref 80.0–100.0)
Platelets: 175 K/uL (ref 150–400)
RBC: 3.64 MIL/uL — ABNORMAL LOW (ref 4.22–5.81)
RDW: 17.1 % — ABNORMAL HIGH (ref 11.5–15.5)
WBC: 3.7 K/uL — ABNORMAL LOW (ref 4.0–10.5)
nRBC: 0 % (ref 0.0–0.2)

## 2024-06-26 LAB — GLUCOSE, CAPILLARY
Glucose-Capillary: 83 mg/dL (ref 70–99)
Glucose-Capillary: 120 mg/dL — ABNORMAL HIGH (ref 70–99)

## 2024-06-26 MED ORDER — NITROGLYCERIN 0.4 MG SL SUBL: 0.4000 mg | SUBLINGUAL_TABLET | SUBLINGUAL | 0 refills | Status: AC | PRN

## 2024-06-26 NOTE — Discharge Summary (Signed)
 Physician Discharge Summary  James [name]. FMW:[id number] DOB: 17-Dec-1945 DOA: 06/24/2024  PCP: James Morgans, NP  Admit date: 06/24/2024 Discharge date: 06/26/2024  Admitted From: Home Disposition: Home  Recommendations for Outpatient Follow-up:  Follow up with PCP in 1 week with repeat CBC/BMP Outpatient follow-up with cardiology if needed Follow up in ED if symptoms worsen or new appear   Home Health: No Equipment/Devices: None  Discharge Condition: Stable CODE STATUS: Full Diet recommendation: Heart healthy/carb modified  Brief/Interim Summary: 78 year old male with history of chronic diastolic heart failure, hypertension, hyperlipidemia, chronic pain syndrome, diabetes mellitus type 2, history of treated hep B and hep C, SLE, multiple myeloma, sleep apnea on CPAP, hypothyroidism, paroxysmal A-fib presented with chest pain.  On presentation, troponins were 26 x 2.  EKG was nonischemic.  Chest x-ray showed cardiomegaly without pulmonary edema or vascular congestion.  Cardiology was consulted.  Subsequently, chest pain has improved.  Echo showed normal EF with no evidence of wall motion abnormalities.  Cardiology has cleared the patient for discharge.  Discharge patient home today.  Discharge Diagnoses:   Chest pain CAD - Atypical. On presentation, troponins were 26 x 2.  EKG was nonischemic.  Chest x-ray showed cardiomegaly without pulmonary edema or vascular congestion.  Cardiology was consulted.  Subsequently, chest pain has improved.  Echo showed normal EF with no evidence of wall motion abnormalities.  Cardiology has cleared the patient for discharge.  Discharge patient home today.  Chronic diastolic CHF Hypertension - Compensated.  Continue Lasix  and losartan .  Outpatient follow-up with cardiology.  Continue amlodipine .  Continue diet and fluid restriction  COPD - Continue home regimen.  Currently stable  Diabetes mellitus type 2 - Continue home insulin   regimen.  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  Outpatient follow-up with pain management.  BPH - Tamsulosin   Obesity class I Outpatient follow-up  Discharge Instructions  Discharge Instructions     Diet - low sodium heart healthy   Complete by: As directed    Diet Carb Modified   Complete by: As directed    Increase activity slowly   Complete by: As directed    No wound care   Complete by: As directed       Allergies as of 06/26/2024       Reactions   Invokana [canagliflozin] Anaphylaxis   Facial/neck/ lip swelling   Ace Inhibitors Cough        Medication List     STOP taking these medications    doxycycline  100 MG tablet Commonly known as: VIBRA -TABS   lidocaine  5 % Commonly known as: Lidoderm    loperamide  2 MG capsule Commonly known as: IMODIUM    loperamide  2 MG tablet Commonly known as: Imodium  A-D   predniSONE  10 MG tablet Commonly known as: DELTASONE    traMADol  50 MG tablet Commonly known as: ULTRAM    zolpidem  5 MG tablet Commonly known as: AMBIEN        TAKE these medications    acetaminophen  325 MG tablet Commonly known as: TYLENOL  Take 3 tablets (975 mg total) by mouth every 6 (six) hours as needed.   amLODipine  10 MG tablet Commonly known as: NORVASC  Take 1 tablet (10 mg total) by mouth every morning.   Calcium-Vitamin D-Minerals 600-400 MG-UNIT Chew Chew 1 tablet by mouth in the morning and at bedtime.   Eliquis   5 MG Tabs tablet Generic drug: apixaban  Take 5 mg by mouth 2 (two) times daily.   Ferrous Fumarate  324 (106 Fe) MG Tabs tablet Commonly known as: HEMOCYTE - 106 mg FE Take 1 tablet by mouth every morning.   finasteride  5 MG tablet Commonly known as: PROSCAR  Take 5 mg by mouth every morning.   furosemide   20 MG tablet Commonly known as: LASIX  TAKE 1 TABLET BY MOUTH EVERY DAY What changed: when to take this   gabapentin  400 MG capsule Commonly known as: NEURONTIN  Take 400-800 mg by mouth See admin instructions. Take 400mg  by mouth at lunch time and 800 mg by mouth at bedtime.   HYDROcodone -acetaminophen  10-325 MG tablet Commonly known as: NORCO Take 1 tablet by mouth every 6 (six) hours as needed for moderate pain (pain score 4-6) or severe pain (pain score 7-10).   lenalidomide  15 MG capsule Commonly known as: REVLIMID  Take 1 capsule (15 mg total) by mouth daily. Take 1 capsule daily for 21 days. None for the next 7 days   Linzess  145 MCG Caps capsule Generic drug: linaclotide  Take 145 mcg by mouth daily as needed.   losartan  50 MG tablet Commonly known as: COZAAR  Take 1 tablet (50 mg total) by mouth every morning.   mometasone  0.1 % cream Commonly known as: ELOCON  Apply topically daily as needed to treat the eczematous otitis externa.   morphine  15 MG 12 hr tablet Commonly known as: MS CONTIN  Take 30 mg by mouth in the morning, at noon, and at bedtime.   nitroGLYCERIN  0.4 MG SL tablet Commonly known as: NITROSTAT  Place 1 tablet (0.4 mg total) under the tongue every 5 (five) minutes as needed for chest pain (CP or SOB).   pantoprazole  40 MG tablet Commonly known as: PROTONIX  Take 40 mg by mouth 2 (two) times daily.   pioglitazone  15 MG tablet Commonly known as: ACTOS  Take 15 mg by mouth in the morning.   potassium chloride  10 MEQ tablet Commonly known as: KLOR-CON  Take 20 mEq by mouth daily. What changed: Another medication with the same name was removed. Continue taking this medication, and follow the directions you see here.   tamsulosin  0.4 MG Caps capsule Commonly known as: FLOMAX  Take 0.4 mg by mouth daily.   tiZANidine  4 MG tablet Commonly known as: ZANAFLEX  Take 1 tablet (4 mg total) by mouth every 8 (eight) hours as needed for muscle spasms.   Trelegy  Ellipta 200-62.5-25 MCG/ACT Aepb Generic drug: Fluticasone -Umeclidin-Vilant Take 1 puff by mouth 2 (two) times daily. What changed: when to take this   Tresiba  FlexTouch 100 UNIT/ML FlexTouch Pen Generic drug: insulin  degludec Inject 15 Units into the skin daily.   vitamin B-12 500 MCG tablet Commonly known as: CYANOCOBALAMIN  Take 500 mcg by mouth every morning.        Follow-up Information     James Morgans, NP. Schedule an appointment as soon as possible for a visit in 1 week(s).   Contact information: [address] [place] [address] [phone]                Allergies  Allergen Reactions   Invokana [Canagliflozin] Anaphylaxis    Facial/neck/ lip swelling   Ace Inhibitors Cough    Consultations: Cardiology   Procedures/Studies: ECHOCARDIOGRAM COMPLETE Result Date: 06/25/2024    ECHOCARDIOGRAM REPORT   Patient Name:   James [name]. Date of Exam: 06/25/2024 Medical Rec #:  [id number]  Height:       66.0 in Accession #:    [id number]            Weight:       189.0 lb Date of Birth:  Nov 24, 1945             BSA:          1.952 m Patient Age:    78 years              BP:           159/72 mmHg Patient Gender: M                     HR:           54 bpm. Exam Location:  Inpatient Procedure: 2D Echo, Cardiac Doppler, Color Doppler and Intracardiac            Opacification Agent (Both Spectral and Color Flow Doppler were            utilized during procedure). Indications:    Chest Pain  History:        Patient has prior history of Echocardiogram examinations, most                 recent 08/23/2022. CHF, CAD, COPD; Risk Factors:Hypertension and                 Diabetes.  Sonographer:    Philomena Daring Referring Phys: [id number] DAVID MANUEL ORTIZ IMPRESSIONS  1. Left ventricular ejection fraction, by estimation, is 60 to 65%. The left ventricle has normal function. The left ventricle has no regional wall motion abnormalities. Left ventricular  diastolic parameters are consistent with Grade I diastolic dysfunction (impaired relaxation).  2. Right ventricular systolic function is normal. The right ventricular size is normal. Tricuspid regurgitation signal is inadequate for assessing PA pressure.  3. The mitral valve is normal in structure. Trivial mitral valve regurgitation. No evidence of mitral stenosis.  4. The aortic valve was not well visualized. Aortic valve regurgitation is not visualized. No aortic stenosis is present.  5. The inferior vena cava is normal in size with <50% respiratory variability, suggesting right atrial pressure of 8 mmHg. FINDINGS  Left Ventricle: Left ventricular ejection fraction, by estimation, is 60 to 65%. The left ventricle has normal function. The left ventricle has no regional wall motion abnormalities. Definity  contrast agent was given IV to delineate the left ventricular  endocardial borders. The left ventricular internal cavity size was normal in size. There is no left ventricular hypertrophy. Left ventricular diastolic parameters are consistent with Grade I diastolic dysfunction (impaired relaxation). Right Ventricle: The right ventricular size is normal. No increase in right ventricular wall thickness. Right ventricular systolic function is normal. Tricuspid regurgitation signal is inadequate for assessing PA pressure. Left Atrium: Left atrial size was normal in size. Right Atrium: Right atrial size was normal in size. Pericardium: There is no evidence of pericardial effusion. Presence of epicardial fat layer. Mitral Valve: The mitral valve is normal in structure. Trivial mitral valve regurgitation. No evidence of mitral valve stenosis. Tricuspid Valve: The tricuspid valve is normal in structure. Tricuspid valve regurgitation is trivial. Aortic Valve: The aortic valve was not well visualized. Aortic valve regurgitation is not visualized. No aortic stenosis is present. Pulmonic Valve: The pulmonic valve was not well  visualized. Pulmonic valve regurgitation is not visualized. Aorta: The aortic root and ascending aorta are structurally normal, with no evidence of dilitation. Venous: The  inferior vena cava is normal in size with less than 50% respiratory variability, suggesting right atrial pressure of 8 mmHg. IAS/Shunts: The interatrial septum was not well visualized.  LEFT VENTRICLE PLAX 2D LVIDd:         4.70 cm   Diastology LVIDs:         3.10 cm   LV e' medial:    6.53 cm/s LV PW:         0.80 cm   LV E/e' medial:  12.9 LV IVS:        0.80 cm   LV e' lateral:   6.85 cm/s LVOT diam:     2.10 cm   LV E/e' lateral: 12.3 LV SV:         73 LV SV Index:   37 LVOT Area:     3.46 cm LV IVRT:       53 msec  RIGHT VENTRICLE             IVC RV S prime:     17.70 cm/s  IVC diam: 2.20 cm TAPSE (M-mode): 2.7 cm LEFT ATRIUM             Index        RIGHT ATRIUM           Index LA diam:        3.60 cm 1.84 cm/m   RA Area:     18.70 cm LA Vol (A2C):   35.2 ml 18.03 ml/m  RA Volume:   47.90 ml  24.54 ml/m LA Vol (A4C):   39.4 ml 20.18 ml/m LA Biplane Vol: 39.0 ml 19.98 ml/m  AORTIC VALVE LVOT Vmax:   103.00 cm/s LVOT Vmean:  62.900 cm/s LVOT VTI:    0.211 m  AORTA Ao Root diam: 3.10 cm Ao Asc diam:  3.20 cm MITRAL VALVE MV Area (PHT): 4.57 cm    SHUNTS MV Decel Time: 166 msec    Systemic VTI:  0.21 m MV E velocity: 84.20 cm/s  Systemic Diam: 2.10 cm MV A velocity: 94.60 cm/s MV E/A ratio:  0.89 Lonni Nanas MD Electronically signed by Lonni Nanas MD Signature Date/Time: 06/25/2024/4:51:45 PM    Final    DG Chest 2 View Result Date: 06/24/2024 EXAM: 2 VIEW(S) XRAY OF THE CHEST 06/24/2024 11:44:00 PM COMPARISON: Comparison with 11/24/2022. CLINICAL HISTORY: chest pain FINDINGS: LIMITATIONS/ARTIFACTS: Shallow inspiration. LUNGS AND PLEURA: Mild linear opacities in the lung bases suggesting infiltration or atelectasis. No vascular congestion or edema. No pleural effusion. No pneumothorax. HEART AND MEDIASTINUM: Cardiac  enlargement. Calcification of the aorta. BONES AND SOFT TISSUES: Degenerative changes in the spine and shoulders. IMPRESSION: 1. Mild linear opacity at the lung bases, which may represent atelectasis versus mild infiltration. 2. Cardiomegaly without pulmonary edema or vascular congestion. Electronically signed by: Elsie Gravely MD 06/24/2024 11:48 PM EST RP Workstation: [id number]      Subjective: Patient seen and examined at bedside.  Chest pain has not moved.  Physically cooperative.  No fever or vomiting reported  Discharge Exam: Vitals:   06/26/24 0637 06/26/24 0926  BP: 134/61   Pulse:    Resp: 19   Temp: 98.2 F (36.8 C)   SpO2: 100% 97%    General: Pt is alert, awake, not in acute distress.  Elderly male lying in bed. Cardiovascular: rate controlled, S1/S2 + Respiratory: bilateral decreased breath sounds at bases Abdominal: Soft, obese, NT, ND, bowel sounds + Extremities: no edema, no cyanosis    The results  of significant diagnostics from this hospitalization (including imaging, microbiology, ancillary and laboratory) are listed below for reference.     Microbiology: No results found for this or any previous visit (from the past 240 hours).   Labs: BNP (last 3 results) No results for input(s): BNP in the last 8760 hours. Basic Metabolic Panel: Recent Labs  Lab 06/24/24 2358 06/25/24 1158 06/26/24 0507  NA 140 139 141  K 3.6 3.5 3.6  CL 104 105 105  CO2 26 26 28   GLUCOSE 103* 95 81  BUN 13 10 11   CREATININE 1.20 1.13 1.41*  CALCIUM 8.4* 8.6* 8.5*  MG  --  1.9  --   PHOS  --  3.1  --    Liver Function Tests: Recent Labs  Lab 06/25/24 1158 06/26/24 0507  AST 19 16  ALT 11 10  ALKPHOS 107 105  BILITOT 0.5 0.5  PROT 6.0* 5.9*  ALBUMIN 3.5 3.4*   No results for input(s): LIPASE, AMYLASE in the last 168 hours. No results for input(s): AMMONIA in the last 168 hours. CBC: Recent Labs  Lab 06/24/24 2358 06/25/24 0819 06/26/24 0507  WBC  3.9* 3.0* 3.7*  NEUTROABS 2.8  --   --   HGB 11.5* 11.3* 11.6*  HCT 34.9* 33.9* 34.9*  MCV 95.4 94.4 95.9  PLT 176 164 175   Cardiac Enzymes: No results for input(s): CKTOTAL, CKMB, CKMBINDEX, TROPONINI in the last 168 hours. BNP: Invalid input(s): POCBNP CBG: Recent Labs  Lab 06/25/24 1014 06/25/24 1217 06/25/24 1741 06/25/24 2155 06/26/24 0809  GLUCAP 92 95 118* 106* 83   D-Dimer Recent Labs    06/24/24 2358  DDIMER <0.27   Hgb A1c Recent Labs    06/25/24 0819  HGBA1C 5.6   Lipid Profile No results for input(s): CHOL, HDL, LDLCALC, TRIG, CHOLHDL, LDLDIRECT in the last 72 hours. Thyroid  function studies No results for input(s): TSH, T4TOTAL, T3FREE, THYROIDAB in the last 72 hours.  Invalid input(s): FREET3 Anemia work up No results for input(s): VITAMINB12, FOLATE, FERRITIN, TIBC, IRON, RETICCTPCT in the last 72 hours. Urinalysis    Component Value Date/Time   COLORURINE STRAW (A) 12/18/2022 1823   APPEARANCEUR CLEAR 12/18/2022 1823   LABSPEC 1.025 12/18/2022 1823   PHURINE 5.0 12/18/2022 1823   GLUCOSEU NEGATIVE 12/18/2022 1823   HGBUR MODERATE (A) 12/18/2022 1823   BILIRUBINUR NEGATIVE 12/18/2022 1823   KETONESUR NEGATIVE 12/18/2022 1823   PROTEINUR NEGATIVE 12/18/2022 1823   UROBILINOGEN 1.0 10/25/2011 1058   NITRITE NEGATIVE 12/18/2022 1823   LEUKOCYTESUR NEGATIVE 12/18/2022 1823   Sepsis Labs Recent Labs  Lab 06/24/24 2358 06/25/24 0819 06/26/24 0507  WBC 3.9* 3.0* 3.7*   Microbiology No results found for this or any previous visit (from the past 240 hours).   Time coordinating discharge: 35 minutes  SIGNED:   Sophie Mao, MD  [facility] 06/26/2024, 9:56 AM

## 2024-06-26 NOTE — Progress Notes (Signed)
 Reached out the provider in regards medications. Pt medications are pre packed upon arrival to his home and per the pt he doesn't how to distinguish what medications are. Requesting a 7 day supply to be sent to the pharmacy at [facility] with his nitroglycerin 

## 2024-06-26 NOTE — Progress Notes (Signed)
 Progress Note  Patient Name: James [name]. Date of Encounter: 06/26/2024  Primary Cardiologist: Shelda Bruckner, MD   Subjective   Patient see and examined at his bedside. He reports that he feels better.   Inpatient Medications    Scheduled Meds:  amLODipine   10 mg Oral q morning   apixaban   5 mg Oral BID   budesonide -glycopyrrolate -formoterol   2 puff Inhalation BID   cyanocobalamin   500 mcg Oral q morning   Ferrous Fumarate   1 tablet Oral q morning   finasteride   5 mg Oral q morning   furosemide   20 mg Oral q AM   gabapentin   400 mg Oral Q1200   gabapentin   800 mg Oral QHS   insulin  aspart  0-9 Units Subcutaneous TID WC   insulin  glargine-yfgn  15 Units Subcutaneous Daily   losartan   50 mg Oral q morning   morphine   30 mg Oral TID   pantoprazole   40 mg Oral BID   pioglitazone   15 mg Oral q AM   potassium chloride   20 mEq Oral Daily   tamsulosin   0.4 mg Oral Daily   Continuous Infusions:  PRN Meds: HYDROcodone -acetaminophen , linaclotide , nitroGLYCERIN , ondansetron  **OR** ondansetron  (ZOFRAN ) IV   Vital Signs    Vitals:   06/25/24 2154 06/26/24 0202 06/26/24 0202 06/26/24 0637  BP: 123/64  (!) 114/57 134/61  Pulse: 60  (!) 57   Resp: 20 14 20 19   Temp: 98.5 F (36.9 C)  98.5 F (36.9 C) 98.2 F (36.8 C)  TempSrc: Oral  Oral Oral  SpO2: 99%  98% 100%  Weight:      Height:        Intake/Output Summary (Last 24 hours) at 06/26/2024 0910 Last data filed at 06/25/2024 9081 Gross per 24 hour  Intake --  Output 500 ml  Net -500 ml   Filed Weights   06/24/24 2258  Weight: 85.7 kg    Telemetry    Sinus rhythm- Personally Reviewed  ECG    Today- Personally Reviewed  Physical Exam   General: Comfortable, sitting up in the bed Head: Atraumatic, normal size  Eyes: PEERLA, EOMI  Neck: Supple, normal JVD Cardiac: Normal S1, S2; RRR; no murmurs, rubs, or gallops Lungs: Clear to auscultation bilaterally Abd: Soft, nontender, no  hepatomegaly  Ext: warm, no edema Musculoskeletal: No deformities, BUE and BLE strength normal and equal Skin: Warm and dry, no rashes   Neuro: Alert and oriented to person, place, time, and situation, CNII-XII grossly intact, no focal deficits  Psych: Normal mood and affect   Labs    Chemistry Recent Labs  Lab 06/24/24 2358 06/25/24 1158 06/26/24 0507  NA 140 139 141  K 3.6 3.5 3.6  CL 104 105 105  CO2 26 26 28   GLUCOSE 103* 95 81  BUN 13 10 11   CREATININE 1.20 1.13 1.41*  CALCIUM 8.4* 8.6* 8.5*  PROT  --  6.0* 5.9*  ALBUMIN  --  3.5 3.4*  AST  --  19 16  ALT  --  11 10  ALKPHOS  --  107 105  BILITOT  --  0.5 0.5  GFRNONAA >60 >60 50*  ANIONGAP 9 8 9      Hematology Recent Labs  Lab 06/24/24 2358 06/25/24 0819 06/26/24 0507  WBC 3.9* 3.0* 3.7*  RBC 3.66* 3.59* 3.64*  HGB 11.5* 11.3* 11.6*  HCT 34.9* 33.9* 34.9*  MCV 95.4 94.4 95.9  MCH 31.4 31.5 31.9  MCHC 33.0 33.3 33.2  RDW 17.0* 16.9* 17.1*  PLT 176 164 175    Cardiac EnzymesNo results for input(s): TROPONINI in the last 168 hours. No results for input(s): TROPIPOC in the last 168 hours.   BNPNo results for input(s): BNP, PROBNP in the last 168 hours.   DDimer  Recent Labs  Lab 06/24/24 2358  DDIMER <0.27     Radiology    ECHOCARDIOGRAM COMPLETE Result Date: 06/25/2024    ECHOCARDIOGRAM REPORT   Patient Name:   Kosta Schnitzler. Date of Exam: 06/25/2024 Medical Rec #:  [id number]             Height:       66.0 in Accession #:    [id number]            Weight:       189.0 lb Date of Birth:  Dec 07, 1945             BSA:          1.952 m Patient Age:    78 years              BP:           159/72 mmHg Patient Gender: M                     HR:           54 bpm. Exam Location:  Inpatient Procedure: 2D Echo, Cardiac Doppler, Color Doppler and Intracardiac            Opacification Agent (Both Spectral and Color Flow Doppler were            utilized during procedure). Indications:    Chest Pain   History:        Patient has prior history of Echocardiogram examinations, most                 recent 08/23/2022. CHF, CAD, COPD; Risk Factors:Hypertension and                 Diabetes.  Sonographer:    Philomena Daring Referring Phys: [id number] DAVID MANUEL ORTIZ IMPRESSIONS  1. Left ventricular ejection fraction, by estimation, is 60 to 65%. The left ventricle has normal function. The left ventricle has no regional wall motion abnormalities. Left ventricular diastolic parameters are consistent with Grade I diastolic dysfunction (impaired relaxation).  2. Right ventricular systolic function is normal. The right ventricular size is normal. Tricuspid regurgitation signal is inadequate for assessing PA pressure.  3. The mitral valve is normal in structure. Trivial mitral valve regurgitation. No evidence of mitral stenosis.  4. The aortic valve was not well visualized. Aortic valve regurgitation is not visualized. No aortic stenosis is present.  5. The inferior vena cava is normal in size with <50% respiratory variability, suggesting right atrial pressure of 8 mmHg. FINDINGS  Left Ventricle: Left ventricular ejection fraction, by estimation, is 60 to 65%. The left ventricle has normal function. The left ventricle has no regional wall motion abnormalities. Definity  contrast agent was given IV to delineate the left ventricular  endocardial borders. The left ventricular internal cavity size was normal in size. There is no left ventricular hypertrophy. Left ventricular diastolic parameters are consistent with Grade I diastolic dysfunction (impaired relaxation). Right Ventricle: The right ventricular size is normal. No increase in right ventricular wall thickness. Right ventricular systolic function is normal. Tricuspid regurgitation signal is inadequate for assessing PA pressure. Left Atrium: Left atrial size was normal in size.  Right Atrium: Right atrial size was normal in size. Pericardium: There is no evidence of pericardial  effusion. Presence of epicardial fat layer. Mitral Valve: The mitral valve is normal in structure. Trivial mitral valve regurgitation. No evidence of mitral valve stenosis. Tricuspid Valve: The tricuspid valve is normal in structure. Tricuspid valve regurgitation is trivial. Aortic Valve: The aortic valve was not well visualized. Aortic valve regurgitation is not visualized. No aortic stenosis is present. Pulmonic Valve: The pulmonic valve was not well visualized. Pulmonic valve regurgitation is not visualized. Aorta: The aortic root and ascending aorta are structurally normal, with no evidence of dilitation. Venous: The inferior vena cava is normal in size with less than 50% respiratory variability, suggesting right atrial pressure of 8 mmHg. IAS/Shunts: The interatrial septum was not well visualized.  LEFT VENTRICLE PLAX 2D LVIDd:         4.70 cm   Diastology LVIDs:         3.10 cm   LV e' medial:    6.53 cm/s LV PW:         0.80 cm   LV E/e' medial:  12.9 LV IVS:        0.80 cm   LV e' lateral:   6.85 cm/s LVOT diam:     2.10 cm   LV E/e' lateral: 12.3 LV SV:         73 LV SV Index:   37 LVOT Area:     3.46 cm LV IVRT:       53 msec  RIGHT VENTRICLE             IVC RV S prime:     17.70 cm/s  IVC diam: 2.20 cm TAPSE (M-mode): 2.7 cm LEFT ATRIUM             Index        RIGHT ATRIUM           Index LA diam:        3.60 cm 1.84 cm/m   RA Area:     18.70 cm LA Vol (A2C):   35.2 ml 18.03 ml/m  RA Volume:   47.90 ml  24.54 ml/m LA Vol (A4C):   39.4 ml 20.18 ml/m LA Biplane Vol: 39.0 ml 19.98 ml/m  AORTIC VALVE LVOT Vmax:   103.00 cm/s LVOT Vmean:  62.900 cm/s LVOT VTI:    0.211 m  AORTA Ao Root diam: 3.10 cm Ao Asc diam:  3.20 cm MITRAL VALVE MV Area (PHT): 4.57 cm    SHUNTS MV Decel Time: 166 msec    Systemic VTI:  0.21 m MV E velocity: 84.20 cm/s  Systemic Diam: 2.10 cm MV A velocity: 94.60 cm/s MV E/A ratio:  0.89 Lonni Nanas MD Electronically signed by Lonni Nanas MD Signature Date/Time:  06/25/2024/4:51:45 PM    Final    DG Chest 2 View Result Date: 06/24/2024 EXAM: 2 VIEW(S) XRAY OF THE CHEST 06/24/2024 11:44:00 PM COMPARISON: Comparison with 11/24/2022. CLINICAL HISTORY: chest pain FINDINGS: LIMITATIONS/ARTIFACTS: Shallow inspiration. LUNGS AND PLEURA: Mild linear opacities in the lung bases suggesting infiltration or atelectasis. No vascular congestion or edema. No pleural effusion. No pneumothorax. HEART AND MEDIASTINUM: Cardiac enlargement. Calcification of the aorta. BONES AND SOFT TISSUES: Degenerative changes in the spine and shoulders. IMPRESSION: 1. Mild linear opacity at the lung bases, which may represent atelectasis versus mild infiltration. 2. Cardiomegaly without pulmonary edema or vascular congestion. Electronically signed by: Elsie Gravely MD 06/24/2024 11:48 PM EST RP Workstation: [id number]  Cardiac Studies   echo  Patient Profile     78 y.o. male CAD with chest pain   Assessment & Plan    Coronary artery disease -chest pain has improved significantly.  This was atypical chest pain doubt ACS here.  Troponin minimal no need for pursuing ischemic evaluation at this time.  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 From a cardiovascular standpoint this patient can be discharged to home.     For questions or updates, please contact [facility] Please consult [email or web address] for contact info under Cardiology/STEMI.      Signed, Ingeborg Fite, DO  06/26/2024, 9:10 AM

## 2024-06-26 NOTE — TOC Transition Note (Signed)
 Transition of Care [facility]) - Discharge Note   Patient Details  Name: James [name]. MRN: [id number] Date of Birth: [birth year]  [facility] of [facility]) CM/SW Contact:  Bascom Service, RN Phone Number: 06/26/2024, 10:55 AM   Clinical Narrative:   d/c home no CM needs.    Final next level of care: Home/Self Care Barriers to Discharge: No Barriers Identified   Patient Goals and CMS Choice Patient states their goals for this hospitalization and ongoing recovery are:: Home CMS Medicare.gov Compare Post Acute Care list provided to:: Patient Choice offered to / list presented to : Patient [place] ownership interest in [facility].provided to:: Patient    Discharge Placement                       Discharge Plan and Services Additional resources added to the After Visit Summary for                                       Social Drivers of Health (SDOH) Interventions SDOH Screenings   Food Insecurity: No Food Insecurity (06/25/2024)  Housing: Low Risk  (06/25/2024)  Transportation Needs: No Transportation Needs (06/25/2024)  Utilities: Not At Risk (06/25/2024)  Social Connections: Socially Integrated (06/25/2024)  Tobacco Use: Medium Risk (06/26/2024)     Readmission Risk Interventions     No data to display

## 2024-06-30 ENCOUNTER — Encounter (HOSPITAL_BASED_OUTPATIENT_CLINIC_OR_DEPARTMENT_OTHER): Admitting: General Surgery

## 2024-07-02 ENCOUNTER — Telehealth: Payer: Self-pay | Admitting: *Deleted

## 2024-07-02 NOTE — Telephone Encounter (Signed)
 Received vm message from pt. He states he has not received his Revlimid  yet. It was sent in on 06/25/24.  TCT [facility]. They state they need to schedule the nurse call for the monthly counseling. They will call in the next 1-2 hours. TCT patient No answer but was able to leave vm advising pt to expect a call from the specialty pharmacy in the next 1-2  hours. Once that is done, they will ship his medication.

## 2024-07-04 ENCOUNTER — Inpatient Hospital Stay: Attending: Hematology and Oncology

## 2024-07-07 ENCOUNTER — Encounter (HOSPITAL_BASED_OUTPATIENT_CLINIC_OR_DEPARTMENT_OTHER): Attending: General Surgery | Admitting: Internal Medicine

## 2024-07-07 DIAGNOSIS — I5032 Chronic diastolic (congestive) heart failure: Secondary | ICD-10-CM | POA: Insufficient documentation

## 2024-07-07 DIAGNOSIS — L97819 Non-pressure chronic ulcer of other part of right lower leg with unspecified severity: Secondary | ICD-10-CM | POA: Insufficient documentation

## 2024-07-07 DIAGNOSIS — E11622 Type 2 diabetes mellitus with other skin ulcer: Secondary | ICD-10-CM | POA: Diagnosis present

## 2024-07-07 DIAGNOSIS — J449 Chronic obstructive pulmonary disease, unspecified: Secondary | ICD-10-CM | POA: Diagnosis not present

## 2024-07-07 DIAGNOSIS — L859 Epidermal thickening, unspecified: Secondary | ICD-10-CM | POA: Insufficient documentation

## 2024-07-07 DIAGNOSIS — C9000 Multiple myeloma not having achieved remission: Secondary | ICD-10-CM | POA: Diagnosis not present

## 2024-07-14 ENCOUNTER — Encounter (HOSPITAL_BASED_OUTPATIENT_CLINIC_OR_DEPARTMENT_OTHER): Admitting: Internal Medicine

## 2024-07-14 DIAGNOSIS — E11622 Type 2 diabetes mellitus with other skin ulcer: Secondary | ICD-10-CM | POA: Diagnosis not present

## 2024-07-14 DIAGNOSIS — I10 Essential (primary) hypertension: Secondary | ICD-10-CM

## 2024-07-21 ENCOUNTER — Encounter (HOSPITAL_BASED_OUTPATIENT_CLINIC_OR_DEPARTMENT_OTHER): Admitting: General Surgery

## 2024-07-21 DIAGNOSIS — E11622 Type 2 diabetes mellitus with other skin ulcer: Secondary | ICD-10-CM | POA: Diagnosis not present

## 2024-07-29 ENCOUNTER — Inpatient Hospital Stay: Admitting: Hematology and Oncology

## 2024-07-29 ENCOUNTER — Inpatient Hospital Stay

## 2024-07-29 ENCOUNTER — Other Ambulatory Visit: Payer: Self-pay | Admitting: Hematology and Oncology

## 2024-07-29 DIAGNOSIS — C9001 Multiple myeloma in remission: Secondary | ICD-10-CM
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
## 2024-07-30 ENCOUNTER — Other Ambulatory Visit: Payer: Self-pay | Admitting: *Deleted

## 2024-07-30 DIAGNOSIS — C9001 Multiple myeloma in remission: Secondary | ICD-10-CM

## 2024-07-30 MED ORDER — LENALIDOMIDE 15 MG PO CAPS: 15.0000 mg | ORAL_CAPSULE | Freq: Every day | ORAL | 0 refills | Status: DC

## 2024-08-04 ENCOUNTER — Encounter (HOSPITAL_BASED_OUTPATIENT_CLINIC_OR_DEPARTMENT_OTHER): Admitting: General Surgery

## 2024-08-12 ENCOUNTER — Encounter (HOSPITAL_BASED_OUTPATIENT_CLINIC_OR_DEPARTMENT_OTHER): Attending: General Surgery | Admitting: General Surgery

## 2024-08-12 DIAGNOSIS — E11622 Type 2 diabetes mellitus with other skin ulcer: Secondary | ICD-10-CM | POA: Insufficient documentation

## 2024-08-12 DIAGNOSIS — L859 Epidermal thickening, unspecified: Secondary | ICD-10-CM | POA: Insufficient documentation

## 2024-08-12 DIAGNOSIS — L97819 Non-pressure chronic ulcer of other part of right lower leg with unspecified severity: Secondary | ICD-10-CM | POA: Insufficient documentation

## 2024-08-12 DIAGNOSIS — I5032 Chronic diastolic (congestive) heart failure: Secondary | ICD-10-CM | POA: Insufficient documentation

## 2024-08-12 DIAGNOSIS — C9000 Multiple myeloma not having achieved remission: Secondary | ICD-10-CM | POA: Insufficient documentation

## 2024-08-12 DIAGNOSIS — R600 Localized edema: Secondary | ICD-10-CM | POA: Insufficient documentation

## 2024-08-18 ENCOUNTER — Encounter (HOSPITAL_BASED_OUTPATIENT_CLINIC_OR_DEPARTMENT_OTHER): Admitting: General Surgery

## 2024-08-24 ENCOUNTER — Other Ambulatory Visit: Payer: Self-pay | Admitting: Hematology and Oncology

## 2024-08-24 DIAGNOSIS — C9001 Multiple myeloma in remission: Secondary | ICD-10-CM

## 2024-08-26 ENCOUNTER — Encounter (HOSPITAL_BASED_OUTPATIENT_CLINIC_OR_DEPARTMENT_OTHER): Admitting: General Surgery

## 2024-08-27 ENCOUNTER — Ambulatory Visit: Payer: Self-pay | Admitting: Podiatry

## 2024-08-27 ENCOUNTER — Other Ambulatory Visit: Payer: Self-pay | Admitting: *Deleted

## 2024-08-27 DIAGNOSIS — M79674 Pain in right toe(s): Secondary | ICD-10-CM

## 2024-08-27 DIAGNOSIS — L600 Ingrowing nail: Secondary | ICD-10-CM

## 2024-08-27 DIAGNOSIS — B351 Tinea unguium: Secondary | ICD-10-CM | POA: Diagnosis not present

## 2024-08-27 DIAGNOSIS — C9001 Multiple myeloma in remission: Secondary | ICD-10-CM

## 2024-08-27 DIAGNOSIS — M79675 Pain in left toe(s): Secondary | ICD-10-CM | POA: Diagnosis not present

## 2024-08-27 MED ORDER — LENALIDOMIDE 15 MG PO CAPS: 15.0000 mg | ORAL_CAPSULE | Freq: Every day | ORAL | 0 refills | Status: AC

## 2024-08-27 NOTE — Progress Notes (Signed)
 Patient presents for evaluation and treatment of tenderness and some redness around nails feet.  Tenderness around toes with walking and wearing shoes.  Big toes especially bother him with the thickness of them bother him with wearing shoes and walking.  Physical exam:  General appearance: Alert, pleasant, and in no acute distress.  Vascular: Pedal pulses: DP 2/4 B/L, PT 1 1/4 B/L.  Moderate to severe edema lower legs bilaterally.  Capillary refill time immediate bilaterally.  Neurologic: Light touch grossly intact bilaterally.  Decreased Achilles tendon reflex left.  Unable to check right due to Foot locker.  Vibratory sensation slightly decreased foot bilaterally  Dermatologic:  Nails thickened, disfigured, discolored 1-5 BL with subungual debris.  Redness and hypertrophic nail folds along nail folds bilaterally but no signs of drainage or infection.  Unna boot on the right lower leg and ankle.  Hallux nails particularly thickened and dystrophic and gryphotic with redness along nail fold.  Musculoskeletal:  Hammertoes 2 through 5 bilaterally.  Normal muscle strength lower extremity bilaterally.   Diagnosis: 1. Painful onychomycotic nails 1 through 5 bilaterally. 2. Pain toes 1 through 5 bilaterally. 3.  Ingrown nail hallux bilaterally  Plan: -New patient office visit for evaluation and management level 3.  Modifier 25. -Discussed with him the thick dystrophic ingrown hallux nails.  Recommended just periodic debridement.  If they get worse or he starts getting more problems with them matricectomy of the nails could be considered.  -Debrided onychomycotic nails 1 through 5 bilaterally.  Sharply debrided nails with nail clipper and reduced with a power bur.  Return 3 months [facility]

## 2024-08-29 ENCOUNTER — Inpatient Hospital Stay: Attending: Hematology and Oncology

## 2024-09-01 ENCOUNTER — Encounter (HOSPITAL_BASED_OUTPATIENT_CLINIC_OR_DEPARTMENT_OTHER): Admitting: General Surgery

## 2024-09-09 ENCOUNTER — Encounter (HOSPITAL_BASED_OUTPATIENT_CLINIC_OR_DEPARTMENT_OTHER): Admitting: General Surgery

## 2024-11-25 ENCOUNTER — Ambulatory Visit: Admitting: Podiatry
